# Patient Record
Sex: Female | Born: 1960
Health system: Southern US, Community
[De-identification: ages and names within clinical notes are randomized; demographics above are authoritative.]

## PROBLEM LIST (undated history)

## (undated) DIAGNOSIS — E119 Type 2 diabetes mellitus without complications: Secondary | ICD-10-CM

## (undated) DIAGNOSIS — R7989 Other specified abnormal findings of blood chemistry: Secondary | ICD-10-CM

## (undated) DIAGNOSIS — F329 Major depressive disorder, single episode, unspecified: Secondary | ICD-10-CM

## (undated) DIAGNOSIS — R410 Disorientation, unspecified: Secondary | ICD-10-CM

## (undated) DIAGNOSIS — N182 Chronic kidney disease, stage 2 (mild): Secondary | ICD-10-CM

## (undated) DIAGNOSIS — R9431 Abnormal electrocardiogram [ECG] [EKG]: Secondary | ICD-10-CM

## (undated) DIAGNOSIS — E875 Hyperkalemia: Secondary | ICD-10-CM

## (undated) DIAGNOSIS — F419 Anxiety disorder, unspecified: Secondary | ICD-10-CM

## (undated) DIAGNOSIS — I2510 Atherosclerotic heart disease of native coronary artery without angina pectoris: Secondary | ICD-10-CM

## (undated) DIAGNOSIS — E7800 Pure hypercholesterolemia, unspecified: Secondary | ICD-10-CM

## (undated) DIAGNOSIS — I4892 Unspecified atrial flutter: Secondary | ICD-10-CM

## (undated) DIAGNOSIS — D509 Iron deficiency anemia, unspecified: Secondary | ICD-10-CM

## (undated) DIAGNOSIS — W19XXXA Unspecified fall, initial encounter: Secondary | ICD-10-CM

## (undated) DIAGNOSIS — I130 Hypertensive heart and chronic kidney disease with heart failure and stage 1 through stage 4 chronic kidney disease, or unspecified chronic kidney disease: Secondary | ICD-10-CM

## (undated) DIAGNOSIS — R296 Repeated falls: Secondary | ICD-10-CM

## (undated) DIAGNOSIS — R609 Edema, unspecified: Secondary | ICD-10-CM

## (undated) DIAGNOSIS — F32A Depression, unspecified: Secondary | ICD-10-CM

## (undated) DIAGNOSIS — I10 Essential (primary) hypertension: Secondary | ICD-10-CM

## (undated) DIAGNOSIS — I428 Other cardiomyopathies: Secondary | ICD-10-CM

## (undated) DIAGNOSIS — I480 Paroxysmal atrial fibrillation: Secondary | ICD-10-CM

## (undated) DIAGNOSIS — I5042 Chronic combined systolic (congestive) and diastolic (congestive) heart failure: Secondary | ICD-10-CM

## (undated) DIAGNOSIS — E871 Hypo-osmolality and hyponatremia: Secondary | ICD-10-CM

## (undated) DIAGNOSIS — I5022 Chronic systolic (congestive) heart failure: Secondary | ICD-10-CM

## (undated) DIAGNOSIS — E0590 Thyrotoxicosis, unspecified without thyrotoxic crisis or storm: Secondary | ICD-10-CM

## (undated) DIAGNOSIS — K922 Gastrointestinal hemorrhage, unspecified: Secondary | ICD-10-CM

## (undated) DIAGNOSIS — E876 Hypokalemia: Secondary | ICD-10-CM

## (undated) DIAGNOSIS — K250 Acute gastric ulcer with hemorrhage: Secondary | ICD-10-CM

## (undated) DIAGNOSIS — I4819 Other persistent atrial fibrillation: Secondary | ICD-10-CM

## (undated) HISTORY — DX: Gastrointestinal hemorrhage, unspecified: K92.2

## (undated) HISTORY — DX: Unspecified atrial flutter: I48.92

## (undated) HISTORY — DX: Iron deficiency anemia, unspecified: D50.9

## (undated) HISTORY — DX: Depression, unspecified: F32.A

## (undated) HISTORY — DX: Pure hypercholesterolemia, unspecified: E78.00

## (undated) HISTORY — DX: Chronic combined systolic (congestive) and diastolic (congestive) heart failure: I50.42

## (undated) HISTORY — DX: Type 2 diabetes mellitus without complications: E11.9

## (undated) HISTORY — DX: Edema, unspecified: R60.9

## (undated) HISTORY — DX: Paroxysmal atrial fibrillation: I48.0

## (undated) HISTORY — PX: TUBAL LIGATION: SHX77

## (undated) HISTORY — DX: Acute gastric ulcer with hemorrhage: K25.0

## (undated) HISTORY — DX: Thyrotoxicosis, unspecified without thyrotoxic crisis or storm: E05.90

## (undated) HISTORY — DX: Hyperkalemia: E87.5

## (undated) HISTORY — DX: Hypertensive heart and chronic kidney disease with heart failure and stage 1 through stage 4 chronic kidney disease, or unspecified chronic kidney disease: I13.0

## (undated) HISTORY — DX: Anxiety disorder, unspecified: F41.9

## (undated) HISTORY — DX: Essential (primary) hypertension: I10

## (undated) HISTORY — DX: Other specified abnormal findings of blood chemistry: R79.89

## (undated) HISTORY — DX: Atherosclerotic heart disease of native coronary artery without angina pectoris: I25.10

---
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
## 2015-06-17 DIAGNOSIS — E1165 Type 2 diabetes mellitus with hyperglycemia: Secondary | ICD-10-CM | POA: Diagnosis not present

## 2015-06-17 DIAGNOSIS — E7800 Pure hypercholesterolemia, unspecified: Secondary | ICD-10-CM | POA: Diagnosis not present

## 2015-06-17 DIAGNOSIS — J0101 Acute recurrent maxillary sinusitis: Secondary | ICD-10-CM | POA: Diagnosis not present

## 2015-06-17 DIAGNOSIS — M159 Polyosteoarthritis, unspecified: Secondary | ICD-10-CM | POA: Diagnosis not present

## 2015-06-17 DIAGNOSIS — D649 Anemia, unspecified: Secondary | ICD-10-CM | POA: Diagnosis not present

## 2015-06-17 DIAGNOSIS — R7989 Other specified abnormal findings of blood chemistry: Secondary | ICD-10-CM | POA: Diagnosis not present

## 2015-06-17 DIAGNOSIS — R609 Edema, unspecified: Secondary | ICD-10-CM | POA: Diagnosis not present

## 2015-06-17 DIAGNOSIS — R69 Illness, unspecified: Secondary | ICD-10-CM | POA: Diagnosis not present

## 2015-06-17 DIAGNOSIS — E0590 Thyrotoxicosis, unspecified without thyrotoxic crisis or storm: Secondary | ICD-10-CM | POA: Diagnosis not present

## 2015-06-17 DIAGNOSIS — I10 Essential (primary) hypertension: Secondary | ICD-10-CM | POA: Diagnosis not present

## 2015-07-01 DIAGNOSIS — R809 Proteinuria, unspecified: Secondary | ICD-10-CM | POA: Diagnosis not present

## 2015-07-01 DIAGNOSIS — E7800 Pure hypercholesterolemia, unspecified: Secondary | ICD-10-CM | POA: Diagnosis not present

## 2015-07-01 DIAGNOSIS — M159 Polyosteoarthritis, unspecified: Secondary | ICD-10-CM | POA: Diagnosis not present

## 2015-07-01 DIAGNOSIS — D649 Anemia, unspecified: Secondary | ICD-10-CM | POA: Diagnosis not present

## 2015-07-01 DIAGNOSIS — R69 Illness, unspecified: Secondary | ICD-10-CM | POA: Diagnosis not present

## 2015-07-01 DIAGNOSIS — I10 Essential (primary) hypertension: Secondary | ICD-10-CM | POA: Diagnosis not present

## 2015-07-01 DIAGNOSIS — R609 Edema, unspecified: Secondary | ICD-10-CM | POA: Diagnosis not present

## 2015-07-01 DIAGNOSIS — R7989 Other specified abnormal findings of blood chemistry: Secondary | ICD-10-CM | POA: Diagnosis not present

## 2015-07-01 DIAGNOSIS — E1165 Type 2 diabetes mellitus with hyperglycemia: Secondary | ICD-10-CM | POA: Diagnosis not present

## 2015-07-01 DIAGNOSIS — J208 Acute bronchitis due to other specified organisms: Secondary | ICD-10-CM | POA: Diagnosis not present

## 2015-07-22 DIAGNOSIS — I10 Essential (primary) hypertension: Secondary | ICD-10-CM | POA: Diagnosis not present

## 2015-07-22 DIAGNOSIS — D649 Anemia, unspecified: Secondary | ICD-10-CM | POA: Diagnosis not present

## 2015-07-22 DIAGNOSIS — E1165 Type 2 diabetes mellitus with hyperglycemia: Secondary | ICD-10-CM | POA: Diagnosis not present

## 2015-07-22 DIAGNOSIS — E7800 Pure hypercholesterolemia, unspecified: Secondary | ICD-10-CM | POA: Diagnosis not present

## 2015-07-22 DIAGNOSIS — R69 Illness, unspecified: Secondary | ICD-10-CM | POA: Diagnosis not present

## 2015-07-22 DIAGNOSIS — M25552 Pain in left hip: Secondary | ICD-10-CM | POA: Diagnosis not present

## 2015-07-22 DIAGNOSIS — R945 Abnormal results of liver function studies: Secondary | ICD-10-CM | POA: Diagnosis not present

## 2015-07-22 DIAGNOSIS — R609 Edema, unspecified: Secondary | ICD-10-CM | POA: Diagnosis not present

## 2015-07-22 DIAGNOSIS — R809 Proteinuria, unspecified: Secondary | ICD-10-CM | POA: Diagnosis not present

## 2015-07-22 DIAGNOSIS — M159 Polyosteoarthritis, unspecified: Secondary | ICD-10-CM | POA: Diagnosis not present

## 2015-07-30 DIAGNOSIS — I10 Essential (primary) hypertension: Secondary | ICD-10-CM | POA: Diagnosis not present

## 2015-08-30 DIAGNOSIS — R609 Edema, unspecified: Secondary | ICD-10-CM | POA: Diagnosis not present

## 2015-08-30 DIAGNOSIS — E7800 Pure hypercholesterolemia, unspecified: Secondary | ICD-10-CM | POA: Diagnosis not present

## 2015-08-30 DIAGNOSIS — R69 Illness, unspecified: Secondary | ICD-10-CM | POA: Diagnosis not present

## 2015-08-30 DIAGNOSIS — R945 Abnormal results of liver function studies: Secondary | ICD-10-CM | POA: Diagnosis not present

## 2015-08-30 DIAGNOSIS — M159 Polyosteoarthritis, unspecified: Secondary | ICD-10-CM | POA: Diagnosis not present

## 2015-08-30 DIAGNOSIS — R809 Proteinuria, unspecified: Secondary | ICD-10-CM | POA: Diagnosis not present

## 2015-08-30 DIAGNOSIS — E0590 Thyrotoxicosis, unspecified without thyrotoxic crisis or storm: Secondary | ICD-10-CM | POA: Diagnosis not present

## 2015-08-30 DIAGNOSIS — E1165 Type 2 diabetes mellitus with hyperglycemia: Secondary | ICD-10-CM | POA: Diagnosis not present

## 2015-08-30 DIAGNOSIS — D649 Anemia, unspecified: Secondary | ICD-10-CM | POA: Diagnosis not present

## 2015-08-30 DIAGNOSIS — M25552 Pain in left hip: Secondary | ICD-10-CM | POA: Diagnosis not present

## 2015-09-21 DIAGNOSIS — J0100 Acute maxillary sinusitis, unspecified: Secondary | ICD-10-CM | POA: Diagnosis not present

## 2015-09-27 DIAGNOSIS — R809 Proteinuria, unspecified: Secondary | ICD-10-CM | POA: Diagnosis not present

## 2015-09-27 DIAGNOSIS — E1165 Type 2 diabetes mellitus with hyperglycemia: Secondary | ICD-10-CM | POA: Diagnosis not present

## 2015-09-27 DIAGNOSIS — J0101 Acute recurrent maxillary sinusitis: Secondary | ICD-10-CM | POA: Diagnosis not present

## 2015-09-27 DIAGNOSIS — M159 Polyosteoarthritis, unspecified: Secondary | ICD-10-CM | POA: Diagnosis not present

## 2015-09-27 DIAGNOSIS — R69 Illness, unspecified: Secondary | ICD-10-CM | POA: Diagnosis not present

## 2015-09-27 DIAGNOSIS — R609 Edema, unspecified: Secondary | ICD-10-CM | POA: Diagnosis not present

## 2015-09-27 DIAGNOSIS — E0590 Thyrotoxicosis, unspecified without thyrotoxic crisis or storm: Secondary | ICD-10-CM | POA: Diagnosis not present

## 2015-09-27 DIAGNOSIS — I10 Essential (primary) hypertension: Secondary | ICD-10-CM | POA: Diagnosis not present

## 2015-09-27 DIAGNOSIS — E7800 Pure hypercholesterolemia, unspecified: Secondary | ICD-10-CM | POA: Diagnosis not present

## 2015-09-27 DIAGNOSIS — D649 Anemia, unspecified: Secondary | ICD-10-CM | POA: Diagnosis not present

## 2015-09-27 DIAGNOSIS — R945 Abnormal results of liver function studies: Secondary | ICD-10-CM | POA: Diagnosis not present

## 2015-10-20 DIAGNOSIS — J0100 Acute maxillary sinusitis, unspecified: Secondary | ICD-10-CM | POA: Diagnosis not present

## 2015-10-20 DIAGNOSIS — J209 Acute bronchitis, unspecified: Secondary | ICD-10-CM | POA: Diagnosis not present

## 2015-10-26 DIAGNOSIS — E1165 Type 2 diabetes mellitus with hyperglycemia: Secondary | ICD-10-CM | POA: Diagnosis not present

## 2015-10-26 DIAGNOSIS — M159 Polyosteoarthritis, unspecified: Secondary | ICD-10-CM | POA: Diagnosis not present

## 2015-10-26 DIAGNOSIS — E7800 Pure hypercholesterolemia, unspecified: Secondary | ICD-10-CM | POA: Diagnosis not present

## 2015-10-26 DIAGNOSIS — R945 Abnormal results of liver function studies: Secondary | ICD-10-CM | POA: Diagnosis not present

## 2015-10-26 DIAGNOSIS — R609 Edema, unspecified: Secondary | ICD-10-CM | POA: Diagnosis not present

## 2015-10-26 DIAGNOSIS — R69 Illness, unspecified: Secondary | ICD-10-CM | POA: Diagnosis not present

## 2015-10-26 DIAGNOSIS — I10 Essential (primary) hypertension: Secondary | ICD-10-CM | POA: Diagnosis not present

## 2015-10-26 DIAGNOSIS — E0590 Thyrotoxicosis, unspecified without thyrotoxic crisis or storm: Secondary | ICD-10-CM | POA: Diagnosis not present

## 2015-10-26 DIAGNOSIS — D649 Anemia, unspecified: Secondary | ICD-10-CM | POA: Diagnosis not present

## 2015-10-26 DIAGNOSIS — R809 Proteinuria, unspecified: Secondary | ICD-10-CM | POA: Diagnosis not present

## 2015-11-24 DIAGNOSIS — I10 Essential (primary) hypertension: Secondary | ICD-10-CM | POA: Diagnosis not present

## 2015-11-24 DIAGNOSIS — E1165 Type 2 diabetes mellitus with hyperglycemia: Secondary | ICD-10-CM | POA: Diagnosis not present

## 2015-11-24 DIAGNOSIS — E7800 Pure hypercholesterolemia, unspecified: Secondary | ICD-10-CM | POA: Diagnosis not present

## 2015-11-24 DIAGNOSIS — R945 Abnormal results of liver function studies: Secondary | ICD-10-CM | POA: Diagnosis not present

## 2015-11-24 DIAGNOSIS — R809 Proteinuria, unspecified: Secondary | ICD-10-CM | POA: Diagnosis not present

## 2015-11-24 DIAGNOSIS — E0590 Thyrotoxicosis, unspecified without thyrotoxic crisis or storm: Secondary | ICD-10-CM | POA: Diagnosis not present

## 2015-11-24 DIAGNOSIS — R69 Illness, unspecified: Secondary | ICD-10-CM | POA: Diagnosis not present

## 2015-11-24 DIAGNOSIS — R609 Edema, unspecified: Secondary | ICD-10-CM | POA: Diagnosis not present

## 2015-11-24 DIAGNOSIS — M159 Polyosteoarthritis, unspecified: Secondary | ICD-10-CM | POA: Diagnosis not present

## 2015-11-24 DIAGNOSIS — D649 Anemia, unspecified: Secondary | ICD-10-CM | POA: Diagnosis not present

## 2015-11-24 DIAGNOSIS — Z6831 Body mass index (BMI) 31.0-31.9, adult: Secondary | ICD-10-CM | POA: Diagnosis not present

## 2015-12-22 DIAGNOSIS — R69 Illness, unspecified: Secondary | ICD-10-CM | POA: Diagnosis not present

## 2015-12-22 DIAGNOSIS — E7800 Pure hypercholesterolemia, unspecified: Secondary | ICD-10-CM | POA: Diagnosis not present

## 2015-12-22 DIAGNOSIS — M159 Polyosteoarthritis, unspecified: Secondary | ICD-10-CM | POA: Diagnosis not present

## 2015-12-22 DIAGNOSIS — E0590 Thyrotoxicosis, unspecified without thyrotoxic crisis or storm: Secondary | ICD-10-CM | POA: Diagnosis not present

## 2015-12-22 DIAGNOSIS — R809 Proteinuria, unspecified: Secondary | ICD-10-CM | POA: Diagnosis not present

## 2015-12-22 DIAGNOSIS — E1165 Type 2 diabetes mellitus with hyperglycemia: Secondary | ICD-10-CM | POA: Diagnosis not present

## 2015-12-22 DIAGNOSIS — L0390 Cellulitis, unspecified: Secondary | ICD-10-CM | POA: Diagnosis not present

## 2015-12-22 DIAGNOSIS — R609 Edema, unspecified: Secondary | ICD-10-CM | POA: Diagnosis not present

## 2015-12-22 DIAGNOSIS — D649 Anemia, unspecified: Secondary | ICD-10-CM | POA: Diagnosis not present

## 2015-12-22 DIAGNOSIS — R945 Abnormal results of liver function studies: Secondary | ICD-10-CM | POA: Diagnosis not present

## 2016-01-19 DIAGNOSIS — E1165 Type 2 diabetes mellitus with hyperglycemia: Secondary | ICD-10-CM | POA: Diagnosis not present

## 2016-01-19 DIAGNOSIS — E0590 Thyrotoxicosis, unspecified without thyrotoxic crisis or storm: Secondary | ICD-10-CM | POA: Diagnosis not present

## 2016-01-19 DIAGNOSIS — D649 Anemia, unspecified: Secondary | ICD-10-CM | POA: Diagnosis not present

## 2016-01-19 DIAGNOSIS — M159 Polyosteoarthritis, unspecified: Secondary | ICD-10-CM | POA: Diagnosis not present

## 2016-01-19 DIAGNOSIS — E7800 Pure hypercholesterolemia, unspecified: Secondary | ICD-10-CM | POA: Diagnosis not present

## 2016-01-19 DIAGNOSIS — Z79899 Other long term (current) drug therapy: Secondary | ICD-10-CM | POA: Diagnosis not present

## 2016-01-19 DIAGNOSIS — R809 Proteinuria, unspecified: Secondary | ICD-10-CM | POA: Diagnosis not present

## 2016-01-19 DIAGNOSIS — R945 Abnormal results of liver function studies: Secondary | ICD-10-CM | POA: Diagnosis not present

## 2016-01-19 DIAGNOSIS — R609 Edema, unspecified: Secondary | ICD-10-CM | POA: Diagnosis not present

## 2016-01-19 DIAGNOSIS — R69 Illness, unspecified: Secondary | ICD-10-CM | POA: Diagnosis not present

## 2016-02-20 DIAGNOSIS — R69 Illness, unspecified: Secondary | ICD-10-CM | POA: Diagnosis not present

## 2016-02-20 DIAGNOSIS — E0590 Thyrotoxicosis, unspecified without thyrotoxic crisis or storm: Secondary | ICD-10-CM | POA: Diagnosis not present

## 2016-02-20 DIAGNOSIS — E7800 Pure hypercholesterolemia, unspecified: Secondary | ICD-10-CM | POA: Diagnosis not present

## 2016-02-20 DIAGNOSIS — I10 Essential (primary) hypertension: Secondary | ICD-10-CM | POA: Diagnosis not present

## 2016-02-20 DIAGNOSIS — E1165 Type 2 diabetes mellitus with hyperglycemia: Secondary | ICD-10-CM | POA: Diagnosis not present

## 2016-02-20 DIAGNOSIS — D649 Anemia, unspecified: Secondary | ICD-10-CM | POA: Diagnosis not present

## 2016-02-20 DIAGNOSIS — R609 Edema, unspecified: Secondary | ICD-10-CM | POA: Diagnosis not present

## 2016-02-20 DIAGNOSIS — M159 Polyosteoarthritis, unspecified: Secondary | ICD-10-CM | POA: Diagnosis not present

## 2016-02-20 DIAGNOSIS — R7989 Other specified abnormal findings of blood chemistry: Secondary | ICD-10-CM | POA: Diagnosis not present

## 2016-02-20 DIAGNOSIS — M25552 Pain in left hip: Secondary | ICD-10-CM | POA: Diagnosis not present

## 2016-03-06 DIAGNOSIS — D649 Anemia, unspecified: Secondary | ICD-10-CM | POA: Diagnosis not present

## 2016-03-06 DIAGNOSIS — R609 Edema, unspecified: Secondary | ICD-10-CM | POA: Diagnosis not present

## 2016-03-06 DIAGNOSIS — R7989 Other specified abnormal findings of blood chemistry: Secondary | ICD-10-CM | POA: Diagnosis not present

## 2016-03-06 DIAGNOSIS — E1165 Type 2 diabetes mellitus with hyperglycemia: Secondary | ICD-10-CM | POA: Diagnosis not present

## 2016-03-06 DIAGNOSIS — M159 Polyosteoarthritis, unspecified: Secondary | ICD-10-CM | POA: Diagnosis not present

## 2016-03-06 DIAGNOSIS — M25512 Pain in left shoulder: Secondary | ICD-10-CM | POA: Diagnosis not present

## 2016-03-06 DIAGNOSIS — E7800 Pure hypercholesterolemia, unspecified: Secondary | ICD-10-CM | POA: Diagnosis not present

## 2016-03-06 DIAGNOSIS — I10 Essential (primary) hypertension: Secondary | ICD-10-CM | POA: Diagnosis not present

## 2016-03-06 DIAGNOSIS — E0590 Thyrotoxicosis, unspecified without thyrotoxic crisis or storm: Secondary | ICD-10-CM | POA: Diagnosis not present

## 2016-03-06 DIAGNOSIS — R69 Illness, unspecified: Secondary | ICD-10-CM | POA: Diagnosis not present

## 2016-03-26 DIAGNOSIS — E0590 Thyrotoxicosis, unspecified without thyrotoxic crisis or storm: Secondary | ICD-10-CM | POA: Diagnosis not present

## 2016-03-26 DIAGNOSIS — M159 Polyosteoarthritis, unspecified: Secondary | ICD-10-CM | POA: Diagnosis not present

## 2016-03-26 DIAGNOSIS — M25512 Pain in left shoulder: Secondary | ICD-10-CM | POA: Diagnosis not present

## 2016-03-26 DIAGNOSIS — D649 Anemia, unspecified: Secondary | ICD-10-CM | POA: Diagnosis not present

## 2016-03-26 DIAGNOSIS — R609 Edema, unspecified: Secondary | ICD-10-CM | POA: Diagnosis not present

## 2016-03-26 DIAGNOSIS — E7800 Pure hypercholesterolemia, unspecified: Secondary | ICD-10-CM | POA: Diagnosis not present

## 2016-03-26 DIAGNOSIS — E1165 Type 2 diabetes mellitus with hyperglycemia: Secondary | ICD-10-CM | POA: Diagnosis not present

## 2016-03-26 DIAGNOSIS — R7989 Other specified abnormal findings of blood chemistry: Secondary | ICD-10-CM | POA: Diagnosis not present

## 2016-03-26 DIAGNOSIS — I10 Essential (primary) hypertension: Secondary | ICD-10-CM | POA: Diagnosis not present

## 2016-03-26 DIAGNOSIS — R69 Illness, unspecified: Secondary | ICD-10-CM | POA: Diagnosis not present

## 2016-04-24 DIAGNOSIS — E1165 Type 2 diabetes mellitus with hyperglycemia: Secondary | ICD-10-CM | POA: Diagnosis not present

## 2016-04-24 DIAGNOSIS — D649 Anemia, unspecified: Secondary | ICD-10-CM | POA: Diagnosis not present

## 2016-04-24 DIAGNOSIS — R809 Proteinuria, unspecified: Secondary | ICD-10-CM | POA: Diagnosis not present

## 2016-04-24 DIAGNOSIS — M159 Polyosteoarthritis, unspecified: Secondary | ICD-10-CM | POA: Diagnosis not present

## 2016-04-24 DIAGNOSIS — E7800 Pure hypercholesterolemia, unspecified: Secondary | ICD-10-CM | POA: Diagnosis not present

## 2016-04-24 DIAGNOSIS — R7989 Other specified abnormal findings of blood chemistry: Secondary | ICD-10-CM | POA: Diagnosis not present

## 2016-04-24 DIAGNOSIS — I10 Essential (primary) hypertension: Secondary | ICD-10-CM | POA: Diagnosis not present

## 2016-04-24 DIAGNOSIS — R69 Illness, unspecified: Secondary | ICD-10-CM | POA: Diagnosis not present

## 2016-04-24 DIAGNOSIS — R609 Edema, unspecified: Secondary | ICD-10-CM | POA: Diagnosis not present

## 2016-04-24 DIAGNOSIS — E0590 Thyrotoxicosis, unspecified without thyrotoxic crisis or storm: Secondary | ICD-10-CM | POA: Diagnosis not present

## 2016-05-22 DIAGNOSIS — Z23 Encounter for immunization: Secondary | ICD-10-CM | POA: Diagnosis not present

## 2016-05-22 DIAGNOSIS — R69 Illness, unspecified: Secondary | ICD-10-CM | POA: Diagnosis not present

## 2016-05-22 DIAGNOSIS — D649 Anemia, unspecified: Secondary | ICD-10-CM | POA: Diagnosis not present

## 2016-05-22 DIAGNOSIS — R609 Edema, unspecified: Secondary | ICD-10-CM | POA: Diagnosis not present

## 2016-05-22 DIAGNOSIS — I10 Essential (primary) hypertension: Secondary | ICD-10-CM | POA: Diagnosis not present

## 2016-05-22 DIAGNOSIS — R7989 Other specified abnormal findings of blood chemistry: Secondary | ICD-10-CM | POA: Diagnosis not present

## 2016-05-22 DIAGNOSIS — E7800 Pure hypercholesterolemia, unspecified: Secondary | ICD-10-CM | POA: Diagnosis not present

## 2016-05-22 DIAGNOSIS — M25552 Pain in left hip: Secondary | ICD-10-CM | POA: Diagnosis not present

## 2016-05-22 DIAGNOSIS — E1165 Type 2 diabetes mellitus with hyperglycemia: Secondary | ICD-10-CM | POA: Diagnosis not present

## 2016-05-22 DIAGNOSIS — M159 Polyosteoarthritis, unspecified: Secondary | ICD-10-CM | POA: Diagnosis not present

## 2016-06-14 DIAGNOSIS — R69 Illness, unspecified: Secondary | ICD-10-CM | POA: Diagnosis not present

## 2016-06-14 DIAGNOSIS — M159 Polyosteoarthritis, unspecified: Secondary | ICD-10-CM | POA: Diagnosis not present

## 2016-06-14 DIAGNOSIS — E0590 Thyrotoxicosis, unspecified without thyrotoxic crisis or storm: Secondary | ICD-10-CM | POA: Diagnosis not present

## 2016-06-14 DIAGNOSIS — E1165 Type 2 diabetes mellitus with hyperglycemia: Secondary | ICD-10-CM | POA: Diagnosis not present

## 2016-06-14 DIAGNOSIS — D649 Anemia, unspecified: Secondary | ICD-10-CM | POA: Diagnosis not present

## 2016-06-14 DIAGNOSIS — R7989 Other specified abnormal findings of blood chemistry: Secondary | ICD-10-CM | POA: Diagnosis not present

## 2016-06-14 DIAGNOSIS — R809 Proteinuria, unspecified: Secondary | ICD-10-CM | POA: Diagnosis not present

## 2016-06-14 DIAGNOSIS — M25552 Pain in left hip: Secondary | ICD-10-CM | POA: Diagnosis not present

## 2016-06-14 DIAGNOSIS — I10 Essential (primary) hypertension: Secondary | ICD-10-CM | POA: Diagnosis not present

## 2016-06-14 DIAGNOSIS — R609 Edema, unspecified: Secondary | ICD-10-CM | POA: Diagnosis not present

## 2016-06-20 DIAGNOSIS — R69 Illness, unspecified: Secondary | ICD-10-CM | POA: Diagnosis not present

## 2016-06-20 DIAGNOSIS — E7800 Pure hypercholesterolemia, unspecified: Secondary | ICD-10-CM | POA: Diagnosis not present

## 2016-06-20 DIAGNOSIS — I10 Essential (primary) hypertension: Secondary | ICD-10-CM | POA: Diagnosis not present

## 2016-06-20 DIAGNOSIS — E1165 Type 2 diabetes mellitus with hyperglycemia: Secondary | ICD-10-CM | POA: Diagnosis not present

## 2016-06-20 DIAGNOSIS — R7989 Other specified abnormal findings of blood chemistry: Secondary | ICD-10-CM | POA: Diagnosis not present

## 2016-06-20 DIAGNOSIS — D649 Anemia, unspecified: Secondary | ICD-10-CM | POA: Diagnosis not present

## 2016-06-20 DIAGNOSIS — R609 Edema, unspecified: Secondary | ICD-10-CM | POA: Diagnosis not present

## 2016-06-20 DIAGNOSIS — R809 Proteinuria, unspecified: Secondary | ICD-10-CM | POA: Diagnosis not present

## 2016-06-20 DIAGNOSIS — E0590 Thyrotoxicosis, unspecified without thyrotoxic crisis or storm: Secondary | ICD-10-CM | POA: Diagnosis not present

## 2016-06-20 DIAGNOSIS — M159 Polyosteoarthritis, unspecified: Secondary | ICD-10-CM | POA: Diagnosis not present

## 2016-06-20 DIAGNOSIS — M25552 Pain in left hip: Secondary | ICD-10-CM | POA: Diagnosis not present

## 2016-07-03 DIAGNOSIS — R7989 Other specified abnormal findings of blood chemistry: Secondary | ICD-10-CM | POA: Diagnosis not present

## 2016-07-03 DIAGNOSIS — E1165 Type 2 diabetes mellitus with hyperglycemia: Secondary | ICD-10-CM | POA: Diagnosis not present

## 2016-07-03 DIAGNOSIS — D649 Anemia, unspecified: Secondary | ICD-10-CM | POA: Diagnosis not present

## 2016-07-03 DIAGNOSIS — R809 Proteinuria, unspecified: Secondary | ICD-10-CM | POA: Diagnosis not present

## 2016-07-03 DIAGNOSIS — M25552 Pain in left hip: Secondary | ICD-10-CM | POA: Diagnosis not present

## 2016-07-03 DIAGNOSIS — R609 Edema, unspecified: Secondary | ICD-10-CM | POA: Diagnosis not present

## 2016-07-03 DIAGNOSIS — E0590 Thyrotoxicosis, unspecified without thyrotoxic crisis or storm: Secondary | ICD-10-CM | POA: Diagnosis not present

## 2016-07-03 DIAGNOSIS — M159 Polyosteoarthritis, unspecified: Secondary | ICD-10-CM | POA: Diagnosis not present

## 2016-07-03 DIAGNOSIS — R69 Illness, unspecified: Secondary | ICD-10-CM | POA: Diagnosis not present

## 2016-07-03 DIAGNOSIS — I10 Essential (primary) hypertension: Secondary | ICD-10-CM | POA: Diagnosis not present

## 2016-07-18 DIAGNOSIS — M159 Polyosteoarthritis, unspecified: Secondary | ICD-10-CM | POA: Diagnosis not present

## 2016-08-21 DIAGNOSIS — E7800 Pure hypercholesterolemia, unspecified: Secondary | ICD-10-CM | POA: Diagnosis not present

## 2016-08-21 DIAGNOSIS — E0590 Thyrotoxicosis, unspecified without thyrotoxic crisis or storm: Secondary | ICD-10-CM | POA: Diagnosis not present

## 2016-08-21 DIAGNOSIS — R609 Edema, unspecified: Secondary | ICD-10-CM | POA: Diagnosis not present

## 2016-08-21 DIAGNOSIS — R809 Proteinuria, unspecified: Secondary | ICD-10-CM | POA: Diagnosis not present

## 2016-08-21 DIAGNOSIS — M159 Polyosteoarthritis, unspecified: Secondary | ICD-10-CM | POA: Diagnosis not present

## 2016-08-21 DIAGNOSIS — D649 Anemia, unspecified: Secondary | ICD-10-CM | POA: Diagnosis not present

## 2016-08-21 DIAGNOSIS — I10 Essential (primary) hypertension: Secondary | ICD-10-CM | POA: Diagnosis not present

## 2016-08-21 DIAGNOSIS — R69 Illness, unspecified: Secondary | ICD-10-CM | POA: Diagnosis not present

## 2016-08-21 DIAGNOSIS — E1165 Type 2 diabetes mellitus with hyperglycemia: Secondary | ICD-10-CM | POA: Diagnosis not present

## 2016-08-21 DIAGNOSIS — R7989 Other specified abnormal findings of blood chemistry: Secondary | ICD-10-CM | POA: Diagnosis not present

## 2016-09-18 DIAGNOSIS — R609 Edema, unspecified: Secondary | ICD-10-CM | POA: Diagnosis not present

## 2016-09-18 DIAGNOSIS — M25552 Pain in left hip: Secondary | ICD-10-CM | POA: Diagnosis not present

## 2016-09-18 DIAGNOSIS — R69 Illness, unspecified: Secondary | ICD-10-CM | POA: Diagnosis not present

## 2016-09-18 DIAGNOSIS — E1165 Type 2 diabetes mellitus with hyperglycemia: Secondary | ICD-10-CM | POA: Diagnosis not present

## 2016-09-18 DIAGNOSIS — D649 Anemia, unspecified: Secondary | ICD-10-CM | POA: Diagnosis not present

## 2016-09-18 DIAGNOSIS — R7989 Other specified abnormal findings of blood chemistry: Secondary | ICD-10-CM | POA: Diagnosis not present

## 2016-09-18 DIAGNOSIS — R5382 Chronic fatigue, unspecified: Secondary | ICD-10-CM | POA: Diagnosis not present

## 2016-09-18 DIAGNOSIS — E0590 Thyrotoxicosis, unspecified without thyrotoxic crisis or storm: Secondary | ICD-10-CM | POA: Diagnosis not present

## 2016-09-18 DIAGNOSIS — M159 Polyosteoarthritis, unspecified: Secondary | ICD-10-CM | POA: Diagnosis not present

## 2016-09-18 DIAGNOSIS — I10 Essential (primary) hypertension: Secondary | ICD-10-CM | POA: Diagnosis not present

## 2016-09-21 DIAGNOSIS — R69 Illness, unspecified: Secondary | ICD-10-CM | POA: Diagnosis not present

## 2016-09-21 DIAGNOSIS — I10 Essential (primary) hypertension: Secondary | ICD-10-CM | POA: Diagnosis not present

## 2016-09-21 DIAGNOSIS — R7989 Other specified abnormal findings of blood chemistry: Secondary | ICD-10-CM | POA: Diagnosis not present

## 2016-09-21 DIAGNOSIS — M159 Polyosteoarthritis, unspecified: Secondary | ICD-10-CM | POA: Diagnosis not present

## 2016-09-21 DIAGNOSIS — E1165 Type 2 diabetes mellitus with hyperglycemia: Secondary | ICD-10-CM | POA: Diagnosis not present

## 2016-09-21 DIAGNOSIS — R609 Edema, unspecified: Secondary | ICD-10-CM | POA: Diagnosis not present

## 2016-09-21 DIAGNOSIS — J208 Acute bronchitis due to other specified organisms: Secondary | ICD-10-CM | POA: Diagnosis not present

## 2016-09-21 DIAGNOSIS — D649 Anemia, unspecified: Secondary | ICD-10-CM | POA: Diagnosis not present

## 2016-10-16 DIAGNOSIS — R7989 Other specified abnormal findings of blood chemistry: Secondary | ICD-10-CM | POA: Diagnosis not present

## 2016-10-16 DIAGNOSIS — E0590 Thyrotoxicosis, unspecified without thyrotoxic crisis or storm: Secondary | ICD-10-CM | POA: Diagnosis not present

## 2016-10-16 DIAGNOSIS — E1165 Type 2 diabetes mellitus with hyperglycemia: Secondary | ICD-10-CM | POA: Diagnosis not present

## 2016-10-16 DIAGNOSIS — R69 Illness, unspecified: Secondary | ICD-10-CM | POA: Diagnosis not present

## 2016-10-16 DIAGNOSIS — I10 Essential (primary) hypertension: Secondary | ICD-10-CM | POA: Diagnosis not present

## 2016-10-16 DIAGNOSIS — Z79899 Other long term (current) drug therapy: Secondary | ICD-10-CM | POA: Diagnosis not present

## 2016-10-16 DIAGNOSIS — E7800 Pure hypercholesterolemia, unspecified: Secondary | ICD-10-CM | POA: Diagnosis not present

## 2016-10-16 DIAGNOSIS — D649 Anemia, unspecified: Secondary | ICD-10-CM | POA: Diagnosis not present

## 2016-10-16 DIAGNOSIS — R609 Edema, unspecified: Secondary | ICD-10-CM | POA: Diagnosis not present

## 2016-10-16 DIAGNOSIS — M159 Polyosteoarthritis, unspecified: Secondary | ICD-10-CM | POA: Diagnosis not present

## 2016-10-31 DIAGNOSIS — I10 Essential (primary) hypertension: Secondary | ICD-10-CM | POA: Diagnosis not present

## 2016-11-13 DIAGNOSIS — E7800 Pure hypercholesterolemia, unspecified: Secondary | ICD-10-CM | POA: Diagnosis not present

## 2016-11-13 DIAGNOSIS — M25552 Pain in left hip: Secondary | ICD-10-CM | POA: Diagnosis not present

## 2016-11-13 DIAGNOSIS — D649 Anemia, unspecified: Secondary | ICD-10-CM | POA: Diagnosis not present

## 2016-11-13 DIAGNOSIS — I10 Essential (primary) hypertension: Secondary | ICD-10-CM | POA: Diagnosis not present

## 2016-11-13 DIAGNOSIS — E0590 Thyrotoxicosis, unspecified without thyrotoxic crisis or storm: Secondary | ICD-10-CM | POA: Diagnosis not present

## 2016-11-13 DIAGNOSIS — R7989 Other specified abnormal findings of blood chemistry: Secondary | ICD-10-CM | POA: Diagnosis not present

## 2016-11-13 DIAGNOSIS — M159 Polyosteoarthritis, unspecified: Secondary | ICD-10-CM | POA: Diagnosis not present

## 2016-11-13 DIAGNOSIS — R609 Edema, unspecified: Secondary | ICD-10-CM | POA: Diagnosis not present

## 2016-11-13 DIAGNOSIS — E1165 Type 2 diabetes mellitus with hyperglycemia: Secondary | ICD-10-CM | POA: Diagnosis not present

## 2016-11-13 DIAGNOSIS — R69 Illness, unspecified: Secondary | ICD-10-CM | POA: Diagnosis not present

## 2016-12-11 DIAGNOSIS — I10 Essential (primary) hypertension: Secondary | ICD-10-CM | POA: Diagnosis not present

## 2016-12-11 DIAGNOSIS — R7989 Other specified abnormal findings of blood chemistry: Secondary | ICD-10-CM | POA: Diagnosis not present

## 2016-12-11 DIAGNOSIS — E7800 Pure hypercholesterolemia, unspecified: Secondary | ICD-10-CM | POA: Diagnosis not present

## 2016-12-11 DIAGNOSIS — R609 Edema, unspecified: Secondary | ICD-10-CM | POA: Diagnosis not present

## 2016-12-11 DIAGNOSIS — D649 Anemia, unspecified: Secondary | ICD-10-CM | POA: Diagnosis not present

## 2016-12-11 DIAGNOSIS — M159 Polyosteoarthritis, unspecified: Secondary | ICD-10-CM | POA: Diagnosis not present

## 2016-12-11 DIAGNOSIS — E0590 Thyrotoxicosis, unspecified without thyrotoxic crisis or storm: Secondary | ICD-10-CM | POA: Diagnosis not present

## 2016-12-11 DIAGNOSIS — E1165 Type 2 diabetes mellitus with hyperglycemia: Secondary | ICD-10-CM | POA: Diagnosis not present

## 2016-12-11 DIAGNOSIS — R809 Proteinuria, unspecified: Secondary | ICD-10-CM | POA: Diagnosis not present

## 2016-12-11 DIAGNOSIS — R69 Illness, unspecified: Secondary | ICD-10-CM | POA: Diagnosis not present

## 2017-01-09 DIAGNOSIS — I10 Essential (primary) hypertension: Secondary | ICD-10-CM | POA: Diagnosis not present

## 2017-01-09 DIAGNOSIS — D649 Anemia, unspecified: Secondary | ICD-10-CM | POA: Diagnosis not present

## 2017-01-09 DIAGNOSIS — R69 Illness, unspecified: Secondary | ICD-10-CM | POA: Diagnosis not present

## 2017-01-09 DIAGNOSIS — E7800 Pure hypercholesterolemia, unspecified: Secondary | ICD-10-CM | POA: Diagnosis not present

## 2017-01-09 DIAGNOSIS — J309 Allergic rhinitis, unspecified: Secondary | ICD-10-CM | POA: Diagnosis not present

## 2017-01-09 DIAGNOSIS — E0590 Thyrotoxicosis, unspecified without thyrotoxic crisis or storm: Secondary | ICD-10-CM | POA: Diagnosis not present

## 2017-01-09 DIAGNOSIS — M159 Polyosteoarthritis, unspecified: Secondary | ICD-10-CM | POA: Diagnosis not present

## 2017-01-09 DIAGNOSIS — E1165 Type 2 diabetes mellitus with hyperglycemia: Secondary | ICD-10-CM | POA: Diagnosis not present

## 2017-01-09 DIAGNOSIS — R7989 Other specified abnormal findings of blood chemistry: Secondary | ICD-10-CM | POA: Diagnosis not present

## 2017-01-09 DIAGNOSIS — R609 Edema, unspecified: Secondary | ICD-10-CM | POA: Diagnosis not present

## 2017-02-06 DIAGNOSIS — Z1389 Encounter for screening for other disorder: Secondary | ICD-10-CM | POA: Diagnosis not present

## 2017-02-06 DIAGNOSIS — R609 Edema, unspecified: Secondary | ICD-10-CM | POA: Diagnosis not present

## 2017-02-06 DIAGNOSIS — I10 Essential (primary) hypertension: Secondary | ICD-10-CM | POA: Diagnosis not present

## 2017-02-06 DIAGNOSIS — M159 Polyosteoarthritis, unspecified: Secondary | ICD-10-CM | POA: Diagnosis not present

## 2017-02-06 DIAGNOSIS — R7989 Other specified abnormal findings of blood chemistry: Secondary | ICD-10-CM | POA: Diagnosis not present

## 2017-02-06 DIAGNOSIS — E7800 Pure hypercholesterolemia, unspecified: Secondary | ICD-10-CM | POA: Diagnosis not present

## 2017-02-06 DIAGNOSIS — D649 Anemia, unspecified: Secondary | ICD-10-CM | POA: Diagnosis not present

## 2017-02-06 DIAGNOSIS — E0590 Thyrotoxicosis, unspecified without thyrotoxic crisis or storm: Secondary | ICD-10-CM | POA: Diagnosis not present

## 2017-02-06 DIAGNOSIS — R69 Illness, unspecified: Secondary | ICD-10-CM | POA: Diagnosis not present

## 2017-02-06 DIAGNOSIS — E1165 Type 2 diabetes mellitus with hyperglycemia: Secondary | ICD-10-CM | POA: Diagnosis not present

## 2017-03-04 DIAGNOSIS — E0590 Thyrotoxicosis, unspecified without thyrotoxic crisis or storm: Secondary | ICD-10-CM | POA: Diagnosis not present

## 2017-03-04 DIAGNOSIS — J309 Allergic rhinitis, unspecified: Secondary | ICD-10-CM | POA: Diagnosis not present

## 2017-03-04 DIAGNOSIS — E7800 Pure hypercholesterolemia, unspecified: Secondary | ICD-10-CM | POA: Diagnosis not present

## 2017-03-04 DIAGNOSIS — I10 Essential (primary) hypertension: Secondary | ICD-10-CM | POA: Diagnosis not present

## 2017-03-04 DIAGNOSIS — E1165 Type 2 diabetes mellitus with hyperglycemia: Secondary | ICD-10-CM | POA: Diagnosis not present

## 2017-03-04 DIAGNOSIS — D649 Anemia, unspecified: Secondary | ICD-10-CM | POA: Diagnosis not present

## 2017-03-04 DIAGNOSIS — M159 Polyosteoarthritis, unspecified: Secondary | ICD-10-CM | POA: Diagnosis not present

## 2017-03-04 DIAGNOSIS — R69 Illness, unspecified: Secondary | ICD-10-CM | POA: Diagnosis not present

## 2017-03-04 DIAGNOSIS — R945 Abnormal results of liver function studies: Secondary | ICD-10-CM | POA: Diagnosis not present

## 2017-03-04 DIAGNOSIS — R609 Edema, unspecified: Secondary | ICD-10-CM | POA: Diagnosis not present

## 2017-03-22 DIAGNOSIS — I10 Essential (primary) hypertension: Secondary | ICD-10-CM | POA: Diagnosis not present

## 2017-03-22 DIAGNOSIS — R69 Illness, unspecified: Secondary | ICD-10-CM | POA: Diagnosis not present

## 2017-03-22 DIAGNOSIS — M159 Polyosteoarthritis, unspecified: Secondary | ICD-10-CM | POA: Diagnosis not present

## 2017-03-22 DIAGNOSIS — R945 Abnormal results of liver function studies: Secondary | ICD-10-CM | POA: Diagnosis not present

## 2017-03-22 DIAGNOSIS — E0590 Thyrotoxicosis, unspecified without thyrotoxic crisis or storm: Secondary | ICD-10-CM | POA: Diagnosis not present

## 2017-03-22 DIAGNOSIS — E1165 Type 2 diabetes mellitus with hyperglycemia: Secondary | ICD-10-CM | POA: Diagnosis not present

## 2017-03-22 DIAGNOSIS — D649 Anemia, unspecified: Secondary | ICD-10-CM | POA: Diagnosis not present

## 2017-03-22 DIAGNOSIS — M25552 Pain in left hip: Secondary | ICD-10-CM | POA: Diagnosis not present

## 2017-03-22 DIAGNOSIS — R609 Edema, unspecified: Secondary | ICD-10-CM | POA: Diagnosis not present

## 2017-03-22 DIAGNOSIS — E7800 Pure hypercholesterolemia, unspecified: Secondary | ICD-10-CM | POA: Diagnosis not present

## 2017-04-04 DIAGNOSIS — R609 Edema, unspecified: Secondary | ICD-10-CM | POA: Diagnosis not present

## 2017-04-04 DIAGNOSIS — R945 Abnormal results of liver function studies: Secondary | ICD-10-CM | POA: Diagnosis not present

## 2017-04-04 DIAGNOSIS — D649 Anemia, unspecified: Secondary | ICD-10-CM | POA: Diagnosis not present

## 2017-04-04 DIAGNOSIS — E7800 Pure hypercholesterolemia, unspecified: Secondary | ICD-10-CM | POA: Diagnosis not present

## 2017-04-04 DIAGNOSIS — R69 Illness, unspecified: Secondary | ICD-10-CM | POA: Diagnosis not present

## 2017-04-04 DIAGNOSIS — M159 Polyosteoarthritis, unspecified: Secondary | ICD-10-CM | POA: Diagnosis not present

## 2017-04-04 DIAGNOSIS — I10 Essential (primary) hypertension: Secondary | ICD-10-CM | POA: Diagnosis not present

## 2017-04-04 DIAGNOSIS — E0590 Thyrotoxicosis, unspecified without thyrotoxic crisis or storm: Secondary | ICD-10-CM | POA: Diagnosis not present

## 2017-04-04 DIAGNOSIS — E1165 Type 2 diabetes mellitus with hyperglycemia: Secondary | ICD-10-CM | POA: Diagnosis not present

## 2017-04-04 DIAGNOSIS — M722 Plantar fascial fibromatosis: Secondary | ICD-10-CM | POA: Diagnosis not present

## 2017-05-02 DIAGNOSIS — R945 Abnormal results of liver function studies: Secondary | ICD-10-CM | POA: Diagnosis not present

## 2017-05-02 DIAGNOSIS — E1165 Type 2 diabetes mellitus with hyperglycemia: Secondary | ICD-10-CM | POA: Diagnosis not present

## 2017-05-02 DIAGNOSIS — E7800 Pure hypercholesterolemia, unspecified: Secondary | ICD-10-CM | POA: Diagnosis not present

## 2017-05-02 DIAGNOSIS — R609 Edema, unspecified: Secondary | ICD-10-CM | POA: Diagnosis not present

## 2017-05-02 DIAGNOSIS — R69 Illness, unspecified: Secondary | ICD-10-CM | POA: Diagnosis not present

## 2017-05-02 DIAGNOSIS — M159 Polyosteoarthritis, unspecified: Secondary | ICD-10-CM | POA: Diagnosis not present

## 2017-05-02 DIAGNOSIS — Z1389 Encounter for screening for other disorder: Secondary | ICD-10-CM | POA: Diagnosis not present

## 2017-05-02 DIAGNOSIS — D649 Anemia, unspecified: Secondary | ICD-10-CM | POA: Diagnosis not present

## 2017-05-02 DIAGNOSIS — M25551 Pain in right hip: Secondary | ICD-10-CM | POA: Diagnosis not present

## 2017-05-02 DIAGNOSIS — E0590 Thyrotoxicosis, unspecified without thyrotoxic crisis or storm: Secondary | ICD-10-CM | POA: Diagnosis not present

## 2017-05-21 DIAGNOSIS — I10 Essential (primary) hypertension: Secondary | ICD-10-CM | POA: Diagnosis not present

## 2017-05-21 DIAGNOSIS — M159 Polyosteoarthritis, unspecified: Secondary | ICD-10-CM | POA: Diagnosis not present

## 2017-05-21 DIAGNOSIS — R945 Abnormal results of liver function studies: Secondary | ICD-10-CM | POA: Diagnosis not present

## 2017-05-21 DIAGNOSIS — E1165 Type 2 diabetes mellitus with hyperglycemia: Secondary | ICD-10-CM | POA: Diagnosis not present

## 2017-05-21 DIAGNOSIS — E7800 Pure hypercholesterolemia, unspecified: Secondary | ICD-10-CM | POA: Diagnosis not present

## 2017-05-21 DIAGNOSIS — J0101 Acute recurrent maxillary sinusitis: Secondary | ICD-10-CM | POA: Diagnosis not present

## 2017-05-21 DIAGNOSIS — D649 Anemia, unspecified: Secondary | ICD-10-CM | POA: Diagnosis not present

## 2017-05-21 DIAGNOSIS — R69 Illness, unspecified: Secondary | ICD-10-CM | POA: Diagnosis not present

## 2017-05-21 DIAGNOSIS — R609 Edema, unspecified: Secondary | ICD-10-CM | POA: Diagnosis not present

## 2017-05-21 DIAGNOSIS — E0590 Thyrotoxicosis, unspecified without thyrotoxic crisis or storm: Secondary | ICD-10-CM | POA: Diagnosis not present

## 2017-05-30 DIAGNOSIS — E0590 Thyrotoxicosis, unspecified without thyrotoxic crisis or storm: Secondary | ICD-10-CM | POA: Diagnosis not present

## 2017-05-30 DIAGNOSIS — E1165 Type 2 diabetes mellitus with hyperglycemia: Secondary | ICD-10-CM | POA: Diagnosis not present

## 2017-05-30 DIAGNOSIS — E7800 Pure hypercholesterolemia, unspecified: Secondary | ICD-10-CM | POA: Diagnosis not present

## 2017-05-30 DIAGNOSIS — Z23 Encounter for immunization: Secondary | ICD-10-CM | POA: Diagnosis not present

## 2017-05-30 DIAGNOSIS — R609 Edema, unspecified: Secondary | ICD-10-CM | POA: Diagnosis not present

## 2017-05-30 DIAGNOSIS — M159 Polyosteoarthritis, unspecified: Secondary | ICD-10-CM | POA: Diagnosis not present

## 2017-05-30 DIAGNOSIS — R69 Illness, unspecified: Secondary | ICD-10-CM | POA: Diagnosis not present

## 2017-05-30 DIAGNOSIS — I10 Essential (primary) hypertension: Secondary | ICD-10-CM | POA: Diagnosis not present

## 2017-05-30 DIAGNOSIS — R945 Abnormal results of liver function studies: Secondary | ICD-10-CM | POA: Diagnosis not present

## 2017-05-30 DIAGNOSIS — D649 Anemia, unspecified: Secondary | ICD-10-CM | POA: Diagnosis not present

## 2017-06-28 DIAGNOSIS — R945 Abnormal results of liver function studies: Secondary | ICD-10-CM | POA: Diagnosis not present

## 2017-06-28 DIAGNOSIS — M159 Polyosteoarthritis, unspecified: Secondary | ICD-10-CM | POA: Diagnosis not present

## 2017-06-28 DIAGNOSIS — D649 Anemia, unspecified: Secondary | ICD-10-CM | POA: Diagnosis not present

## 2017-06-28 DIAGNOSIS — R69 Illness, unspecified: Secondary | ICD-10-CM | POA: Diagnosis not present

## 2017-06-28 DIAGNOSIS — J309 Allergic rhinitis, unspecified: Secondary | ICD-10-CM | POA: Diagnosis not present

## 2017-06-28 DIAGNOSIS — R609 Edema, unspecified: Secondary | ICD-10-CM | POA: Diagnosis not present

## 2017-06-28 DIAGNOSIS — I10 Essential (primary) hypertension: Secondary | ICD-10-CM | POA: Diagnosis not present

## 2017-06-28 DIAGNOSIS — E1165 Type 2 diabetes mellitus with hyperglycemia: Secondary | ICD-10-CM | POA: Diagnosis not present

## 2017-06-28 DIAGNOSIS — E7800 Pure hypercholesterolemia, unspecified: Secondary | ICD-10-CM | POA: Diagnosis not present

## 2017-06-28 DIAGNOSIS — E0590 Thyrotoxicosis, unspecified without thyrotoxic crisis or storm: Secondary | ICD-10-CM | POA: Diagnosis not present

## 2017-07-26 DIAGNOSIS — D649 Anemia, unspecified: Secondary | ICD-10-CM | POA: Diagnosis not present

## 2017-07-26 DIAGNOSIS — R609 Edema, unspecified: Secondary | ICD-10-CM | POA: Diagnosis not present

## 2017-07-26 DIAGNOSIS — R945 Abnormal results of liver function studies: Secondary | ICD-10-CM | POA: Diagnosis not present

## 2017-07-26 DIAGNOSIS — R69 Illness, unspecified: Secondary | ICD-10-CM | POA: Diagnosis not present

## 2017-07-26 DIAGNOSIS — J309 Allergic rhinitis, unspecified: Secondary | ICD-10-CM | POA: Diagnosis not present

## 2017-07-26 DIAGNOSIS — M25551 Pain in right hip: Secondary | ICD-10-CM | POA: Diagnosis not present

## 2017-07-26 DIAGNOSIS — E1165 Type 2 diabetes mellitus with hyperglycemia: Secondary | ICD-10-CM | POA: Diagnosis not present

## 2017-07-26 DIAGNOSIS — E0590 Thyrotoxicosis, unspecified without thyrotoxic crisis or storm: Secondary | ICD-10-CM | POA: Diagnosis not present

## 2017-07-26 DIAGNOSIS — E7800 Pure hypercholesterolemia, unspecified: Secondary | ICD-10-CM | POA: Diagnosis not present

## 2017-07-26 DIAGNOSIS — M159 Polyosteoarthritis, unspecified: Secondary | ICD-10-CM | POA: Diagnosis not present

## 2017-08-08 DIAGNOSIS — M159 Polyosteoarthritis, unspecified: Secondary | ICD-10-CM | POA: Diagnosis not present

## 2017-08-08 DIAGNOSIS — E0590 Thyrotoxicosis, unspecified without thyrotoxic crisis or storm: Secondary | ICD-10-CM | POA: Diagnosis not present

## 2017-08-08 DIAGNOSIS — J208 Acute bronchitis due to other specified organisms: Secondary | ICD-10-CM | POA: Diagnosis not present

## 2017-08-08 DIAGNOSIS — D649 Anemia, unspecified: Secondary | ICD-10-CM | POA: Diagnosis not present

## 2017-08-08 DIAGNOSIS — R945 Abnormal results of liver function studies: Secondary | ICD-10-CM | POA: Diagnosis not present

## 2017-08-08 DIAGNOSIS — R609 Edema, unspecified: Secondary | ICD-10-CM | POA: Diagnosis not present

## 2017-08-08 DIAGNOSIS — J309 Allergic rhinitis, unspecified: Secondary | ICD-10-CM | POA: Diagnosis not present

## 2017-08-08 DIAGNOSIS — E7800 Pure hypercholesterolemia, unspecified: Secondary | ICD-10-CM | POA: Diagnosis not present

## 2017-08-08 DIAGNOSIS — R69 Illness, unspecified: Secondary | ICD-10-CM | POA: Diagnosis not present

## 2017-08-08 DIAGNOSIS — E1165 Type 2 diabetes mellitus with hyperglycemia: Secondary | ICD-10-CM | POA: Diagnosis not present

## 2017-08-22 DIAGNOSIS — I10 Essential (primary) hypertension: Secondary | ICD-10-CM | POA: Diagnosis not present

## 2017-08-23 DIAGNOSIS — R945 Abnormal results of liver function studies: Secondary | ICD-10-CM | POA: Diagnosis not present

## 2017-08-23 DIAGNOSIS — M159 Polyosteoarthritis, unspecified: Secondary | ICD-10-CM | POA: Diagnosis not present

## 2017-08-23 DIAGNOSIS — J309 Allergic rhinitis, unspecified: Secondary | ICD-10-CM | POA: Diagnosis not present

## 2017-08-23 DIAGNOSIS — E1165 Type 2 diabetes mellitus with hyperglycemia: Secondary | ICD-10-CM | POA: Diagnosis not present

## 2017-08-23 DIAGNOSIS — R69 Illness, unspecified: Secondary | ICD-10-CM | POA: Diagnosis not present

## 2017-08-23 DIAGNOSIS — E0590 Thyrotoxicosis, unspecified without thyrotoxic crisis or storm: Secondary | ICD-10-CM | POA: Diagnosis not present

## 2017-08-23 DIAGNOSIS — M25552 Pain in left hip: Secondary | ICD-10-CM | POA: Diagnosis not present

## 2017-08-23 DIAGNOSIS — E7800 Pure hypercholesterolemia, unspecified: Secondary | ICD-10-CM | POA: Diagnosis not present

## 2017-08-23 DIAGNOSIS — D649 Anemia, unspecified: Secondary | ICD-10-CM | POA: Diagnosis not present

## 2017-08-23 DIAGNOSIS — R609 Edema, unspecified: Secondary | ICD-10-CM | POA: Diagnosis not present

## 2017-08-28 DIAGNOSIS — Z1211 Encounter for screening for malignant neoplasm of colon: Secondary | ICD-10-CM | POA: Diagnosis not present

## 2017-08-28 DIAGNOSIS — Z800 Family history of malignant neoplasm of digestive organs: Secondary | ICD-10-CM | POA: Diagnosis not present

## 2017-09-05 DIAGNOSIS — E0590 Thyrotoxicosis, unspecified without thyrotoxic crisis or storm: Secondary | ICD-10-CM | POA: Diagnosis not present

## 2017-09-05 DIAGNOSIS — R945 Abnormal results of liver function studies: Secondary | ICD-10-CM | POA: Diagnosis not present

## 2017-09-05 DIAGNOSIS — R609 Edema, unspecified: Secondary | ICD-10-CM | POA: Diagnosis not present

## 2017-09-05 DIAGNOSIS — E1165 Type 2 diabetes mellitus with hyperglycemia: Secondary | ICD-10-CM | POA: Diagnosis not present

## 2017-09-05 DIAGNOSIS — D649 Anemia, unspecified: Secondary | ICD-10-CM | POA: Diagnosis not present

## 2017-09-05 DIAGNOSIS — J309 Allergic rhinitis, unspecified: Secondary | ICD-10-CM | POA: Diagnosis not present

## 2017-09-05 DIAGNOSIS — M159 Polyosteoarthritis, unspecified: Secondary | ICD-10-CM | POA: Diagnosis not present

## 2017-09-05 DIAGNOSIS — I10 Essential (primary) hypertension: Secondary | ICD-10-CM | POA: Diagnosis not present

## 2017-09-05 DIAGNOSIS — R69 Illness, unspecified: Secondary | ICD-10-CM | POA: Diagnosis not present

## 2017-09-05 DIAGNOSIS — E7800 Pure hypercholesterolemia, unspecified: Secondary | ICD-10-CM | POA: Diagnosis not present

## 2017-09-19 DIAGNOSIS — R609 Edema, unspecified: Secondary | ICD-10-CM | POA: Diagnosis not present

## 2017-09-19 DIAGNOSIS — D649 Anemia, unspecified: Secondary | ICD-10-CM | POA: Diagnosis not present

## 2017-09-19 DIAGNOSIS — E0590 Thyrotoxicosis, unspecified without thyrotoxic crisis or storm: Secondary | ICD-10-CM | POA: Diagnosis not present

## 2017-09-19 DIAGNOSIS — R809 Proteinuria, unspecified: Secondary | ICD-10-CM | POA: Diagnosis not present

## 2017-09-19 DIAGNOSIS — R945 Abnormal results of liver function studies: Secondary | ICD-10-CM | POA: Diagnosis not present

## 2017-09-19 DIAGNOSIS — E7800 Pure hypercholesterolemia, unspecified: Secondary | ICD-10-CM | POA: Diagnosis not present

## 2017-09-19 DIAGNOSIS — I10 Essential (primary) hypertension: Secondary | ICD-10-CM | POA: Diagnosis not present

## 2017-09-19 DIAGNOSIS — E1165 Type 2 diabetes mellitus with hyperglycemia: Secondary | ICD-10-CM | POA: Diagnosis not present

## 2017-09-19 DIAGNOSIS — R69 Illness, unspecified: Secondary | ICD-10-CM | POA: Diagnosis not present

## 2017-09-19 DIAGNOSIS — J309 Allergic rhinitis, unspecified: Secondary | ICD-10-CM | POA: Diagnosis not present

## 2017-09-24 DIAGNOSIS — D649 Anemia, unspecified: Secondary | ICD-10-CM | POA: Diagnosis not present

## 2017-09-24 DIAGNOSIS — I10 Essential (primary) hypertension: Secondary | ICD-10-CM | POA: Diagnosis not present

## 2017-09-24 DIAGNOSIS — J0101 Acute recurrent maxillary sinusitis: Secondary | ICD-10-CM | POA: Diagnosis not present

## 2017-09-24 DIAGNOSIS — R609 Edema, unspecified: Secondary | ICD-10-CM | POA: Diagnosis not present

## 2017-09-24 DIAGNOSIS — E7800 Pure hypercholesterolemia, unspecified: Secondary | ICD-10-CM | POA: Diagnosis not present

## 2017-09-24 DIAGNOSIS — R69 Illness, unspecified: Secondary | ICD-10-CM | POA: Diagnosis not present

## 2017-09-24 DIAGNOSIS — E119 Type 2 diabetes mellitus without complications: Secondary | ICD-10-CM | POA: Diagnosis not present

## 2017-09-24 DIAGNOSIS — J309 Allergic rhinitis, unspecified: Secondary | ICD-10-CM | POA: Diagnosis not present

## 2017-09-24 DIAGNOSIS — R945 Abnormal results of liver function studies: Secondary | ICD-10-CM | POA: Diagnosis not present

## 2017-09-24 DIAGNOSIS — E0590 Thyrotoxicosis, unspecified without thyrotoxic crisis or storm: Secondary | ICD-10-CM | POA: Diagnosis not present

## 2017-10-17 DIAGNOSIS — J309 Allergic rhinitis, unspecified: Secondary | ICD-10-CM | POA: Diagnosis not present

## 2017-10-17 DIAGNOSIS — E7800 Pure hypercholesterolemia, unspecified: Secondary | ICD-10-CM | POA: Diagnosis not present

## 2017-10-17 DIAGNOSIS — E119 Type 2 diabetes mellitus without complications: Secondary | ICD-10-CM | POA: Diagnosis not present

## 2017-10-17 DIAGNOSIS — R69 Illness, unspecified: Secondary | ICD-10-CM | POA: Diagnosis not present

## 2017-10-17 DIAGNOSIS — R609 Edema, unspecified: Secondary | ICD-10-CM | POA: Diagnosis not present

## 2017-10-17 DIAGNOSIS — M25552 Pain in left hip: Secondary | ICD-10-CM | POA: Diagnosis not present

## 2017-10-17 DIAGNOSIS — E0590 Thyrotoxicosis, unspecified without thyrotoxic crisis or storm: Secondary | ICD-10-CM | POA: Diagnosis not present

## 2017-10-17 DIAGNOSIS — R945 Abnormal results of liver function studies: Secondary | ICD-10-CM | POA: Diagnosis not present

## 2017-10-17 DIAGNOSIS — M159 Polyosteoarthritis, unspecified: Secondary | ICD-10-CM | POA: Diagnosis not present

## 2017-10-17 DIAGNOSIS — I10 Essential (primary) hypertension: Secondary | ICD-10-CM | POA: Diagnosis not present

## 2017-10-18 DIAGNOSIS — Z1231 Encounter for screening mammogram for malignant neoplasm of breast: Secondary | ICD-10-CM | POA: Diagnosis not present

## 2017-10-23 DIAGNOSIS — J309 Allergic rhinitis, unspecified: Secondary | ICD-10-CM | POA: Diagnosis not present

## 2017-10-23 DIAGNOSIS — R609 Edema, unspecified: Secondary | ICD-10-CM | POA: Diagnosis not present

## 2017-10-23 DIAGNOSIS — E0590 Thyrotoxicosis, unspecified without thyrotoxic crisis or storm: Secondary | ICD-10-CM | POA: Diagnosis not present

## 2017-10-23 DIAGNOSIS — E119 Type 2 diabetes mellitus without complications: Secondary | ICD-10-CM | POA: Diagnosis not present

## 2017-10-23 DIAGNOSIS — R69 Illness, unspecified: Secondary | ICD-10-CM | POA: Diagnosis not present

## 2017-10-23 DIAGNOSIS — E7800 Pure hypercholesterolemia, unspecified: Secondary | ICD-10-CM | POA: Diagnosis not present

## 2017-10-23 DIAGNOSIS — M25552 Pain in left hip: Secondary | ICD-10-CM | POA: Diagnosis not present

## 2017-10-23 DIAGNOSIS — R945 Abnormal results of liver function studies: Secondary | ICD-10-CM | POA: Diagnosis not present

## 2017-10-23 DIAGNOSIS — D649 Anemia, unspecified: Secondary | ICD-10-CM | POA: Diagnosis not present

## 2017-10-23 DIAGNOSIS — I10 Essential (primary) hypertension: Secondary | ICD-10-CM | POA: Diagnosis not present

## 2017-10-23 DIAGNOSIS — M159 Polyosteoarthritis, unspecified: Secondary | ICD-10-CM | POA: Diagnosis not present

## 2017-11-14 DIAGNOSIS — R69 Illness, unspecified: Secondary | ICD-10-CM | POA: Diagnosis not present

## 2017-11-14 DIAGNOSIS — J309 Allergic rhinitis, unspecified: Secondary | ICD-10-CM | POA: Diagnosis not present

## 2017-11-14 DIAGNOSIS — E7800 Pure hypercholesterolemia, unspecified: Secondary | ICD-10-CM | POA: Diagnosis not present

## 2017-11-14 DIAGNOSIS — D649 Anemia, unspecified: Secondary | ICD-10-CM | POA: Diagnosis not present

## 2017-11-14 DIAGNOSIS — R809 Proteinuria, unspecified: Secondary | ICD-10-CM | POA: Diagnosis not present

## 2017-11-14 DIAGNOSIS — R945 Abnormal results of liver function studies: Secondary | ICD-10-CM | POA: Diagnosis not present

## 2017-11-14 DIAGNOSIS — M25552 Pain in left hip: Secondary | ICD-10-CM | POA: Diagnosis not present

## 2017-11-14 DIAGNOSIS — E0590 Thyrotoxicosis, unspecified without thyrotoxic crisis or storm: Secondary | ICD-10-CM | POA: Diagnosis not present

## 2017-11-14 DIAGNOSIS — I10 Essential (primary) hypertension: Secondary | ICD-10-CM | POA: Diagnosis not present

## 2017-11-14 DIAGNOSIS — E119 Type 2 diabetes mellitus without complications: Secondary | ICD-10-CM | POA: Diagnosis not present

## 2017-12-07 DIAGNOSIS — J209 Acute bronchitis, unspecified: Secondary | ICD-10-CM | POA: Diagnosis not present

## 2017-12-07 DIAGNOSIS — J069 Acute upper respiratory infection, unspecified: Secondary | ICD-10-CM | POA: Diagnosis not present

## 2017-12-07 DIAGNOSIS — R0602 Shortness of breath: Secondary | ICD-10-CM | POA: Diagnosis not present

## 2017-12-09 DIAGNOSIS — R945 Abnormal results of liver function studies: Secondary | ICD-10-CM | POA: Diagnosis not present

## 2017-12-09 DIAGNOSIS — D649 Anemia, unspecified: Secondary | ICD-10-CM | POA: Diagnosis not present

## 2017-12-09 DIAGNOSIS — I10 Essential (primary) hypertension: Secondary | ICD-10-CM | POA: Diagnosis not present

## 2017-12-09 DIAGNOSIS — Z1231 Encounter for screening mammogram for malignant neoplasm of breast: Secondary | ICD-10-CM | POA: Diagnosis not present

## 2017-12-09 DIAGNOSIS — E7800 Pure hypercholesterolemia, unspecified: Secondary | ICD-10-CM | POA: Diagnosis not present

## 2017-12-09 DIAGNOSIS — R809 Proteinuria, unspecified: Secondary | ICD-10-CM | POA: Diagnosis not present

## 2017-12-09 DIAGNOSIS — J309 Allergic rhinitis, unspecified: Secondary | ICD-10-CM | POA: Diagnosis not present

## 2017-12-09 DIAGNOSIS — E119 Type 2 diabetes mellitus without complications: Secondary | ICD-10-CM | POA: Diagnosis not present

## 2017-12-09 DIAGNOSIS — J208 Acute bronchitis due to other specified organisms: Secondary | ICD-10-CM | POA: Diagnosis not present

## 2017-12-09 DIAGNOSIS — E0590 Thyrotoxicosis, unspecified without thyrotoxic crisis or storm: Secondary | ICD-10-CM | POA: Diagnosis not present

## 2017-12-12 DIAGNOSIS — R809 Proteinuria, unspecified: Secondary | ICD-10-CM | POA: Diagnosis not present

## 2017-12-12 DIAGNOSIS — R945 Abnormal results of liver function studies: Secondary | ICD-10-CM | POA: Diagnosis not present

## 2017-12-12 DIAGNOSIS — J309 Allergic rhinitis, unspecified: Secondary | ICD-10-CM | POA: Diagnosis not present

## 2017-12-12 DIAGNOSIS — D649 Anemia, unspecified: Secondary | ICD-10-CM | POA: Diagnosis not present

## 2017-12-12 DIAGNOSIS — N189 Chronic kidney disease, unspecified: Secondary | ICD-10-CM | POA: Diagnosis not present

## 2017-12-12 DIAGNOSIS — E119 Type 2 diabetes mellitus without complications: Secondary | ICD-10-CM | POA: Diagnosis not present

## 2017-12-12 DIAGNOSIS — R69 Illness, unspecified: Secondary | ICD-10-CM | POA: Diagnosis not present

## 2017-12-12 DIAGNOSIS — I10 Essential (primary) hypertension: Secondary | ICD-10-CM | POA: Diagnosis not present

## 2017-12-12 DIAGNOSIS — E0590 Thyrotoxicosis, unspecified without thyrotoxic crisis or storm: Secondary | ICD-10-CM | POA: Diagnosis not present

## 2017-12-12 DIAGNOSIS — E7800 Pure hypercholesterolemia, unspecified: Secondary | ICD-10-CM | POA: Diagnosis not present

## 2017-12-19 DIAGNOSIS — Z0100 Encounter for examination of eyes and vision without abnormal findings: Secondary | ICD-10-CM | POA: Diagnosis not present

## 2017-12-19 DIAGNOSIS — H5203 Hypermetropia, bilateral: Secondary | ICD-10-CM | POA: Diagnosis not present

## 2018-01-09 DIAGNOSIS — R945 Abnormal results of liver function studies: Secondary | ICD-10-CM | POA: Diagnosis not present

## 2018-01-09 DIAGNOSIS — R809 Proteinuria, unspecified: Secondary | ICD-10-CM | POA: Diagnosis not present

## 2018-01-09 DIAGNOSIS — E7800 Pure hypercholesterolemia, unspecified: Secondary | ICD-10-CM | POA: Diagnosis not present

## 2018-01-09 DIAGNOSIS — I10 Essential (primary) hypertension: Secondary | ICD-10-CM | POA: Diagnosis not present

## 2018-01-09 DIAGNOSIS — M159 Polyosteoarthritis, unspecified: Secondary | ICD-10-CM | POA: Diagnosis not present

## 2018-01-09 DIAGNOSIS — R69 Illness, unspecified: Secondary | ICD-10-CM | POA: Diagnosis not present

## 2018-01-09 DIAGNOSIS — E0590 Thyrotoxicosis, unspecified without thyrotoxic crisis or storm: Secondary | ICD-10-CM | POA: Diagnosis not present

## 2018-01-09 DIAGNOSIS — J309 Allergic rhinitis, unspecified: Secondary | ICD-10-CM | POA: Diagnosis not present

## 2018-01-09 DIAGNOSIS — D649 Anemia, unspecified: Secondary | ICD-10-CM | POA: Diagnosis not present

## 2018-01-09 DIAGNOSIS — E119 Type 2 diabetes mellitus without complications: Secondary | ICD-10-CM | POA: Diagnosis not present

## 2018-02-06 DIAGNOSIS — E0590 Thyrotoxicosis, unspecified without thyrotoxic crisis or storm: Secondary | ICD-10-CM | POA: Diagnosis not present

## 2018-02-06 DIAGNOSIS — R945 Abnormal results of liver function studies: Secondary | ICD-10-CM | POA: Diagnosis not present

## 2018-02-06 DIAGNOSIS — R69 Illness, unspecified: Secondary | ICD-10-CM | POA: Diagnosis not present

## 2018-02-06 DIAGNOSIS — E7800 Pure hypercholesterolemia, unspecified: Secondary | ICD-10-CM | POA: Diagnosis not present

## 2018-02-06 DIAGNOSIS — I10 Essential (primary) hypertension: Secondary | ICD-10-CM | POA: Diagnosis not present

## 2018-02-06 DIAGNOSIS — E119 Type 2 diabetes mellitus without complications: Secondary | ICD-10-CM | POA: Diagnosis not present

## 2018-02-06 DIAGNOSIS — Z1339 Encounter for screening examination for other mental health and behavioral disorders: Secondary | ICD-10-CM | POA: Diagnosis not present

## 2018-02-06 DIAGNOSIS — J309 Allergic rhinitis, unspecified: Secondary | ICD-10-CM | POA: Diagnosis not present

## 2018-02-06 DIAGNOSIS — M159 Polyosteoarthritis, unspecified: Secondary | ICD-10-CM | POA: Diagnosis not present

## 2018-02-06 DIAGNOSIS — D509 Iron deficiency anemia, unspecified: Secondary | ICD-10-CM | POA: Diagnosis not present

## 2018-02-18 DIAGNOSIS — M25552 Pain in left hip: Secondary | ICD-10-CM | POA: Diagnosis not present

## 2018-02-18 DIAGNOSIS — E7800 Pure hypercholesterolemia, unspecified: Secondary | ICD-10-CM | POA: Diagnosis not present

## 2018-02-18 DIAGNOSIS — E119 Type 2 diabetes mellitus without complications: Secondary | ICD-10-CM | POA: Diagnosis not present

## 2018-02-18 DIAGNOSIS — E0590 Thyrotoxicosis, unspecified without thyrotoxic crisis or storm: Secondary | ICD-10-CM | POA: Diagnosis not present

## 2018-02-18 DIAGNOSIS — R809 Proteinuria, unspecified: Secondary | ICD-10-CM | POA: Diagnosis not present

## 2018-02-18 DIAGNOSIS — D509 Iron deficiency anemia, unspecified: Secondary | ICD-10-CM | POA: Diagnosis not present

## 2018-02-18 DIAGNOSIS — R945 Abnormal results of liver function studies: Secondary | ICD-10-CM | POA: Diagnosis not present

## 2018-02-18 DIAGNOSIS — I10 Essential (primary) hypertension: Secondary | ICD-10-CM | POA: Diagnosis not present

## 2018-02-18 DIAGNOSIS — J309 Allergic rhinitis, unspecified: Secondary | ICD-10-CM | POA: Diagnosis not present

## 2018-02-18 DIAGNOSIS — Z1339 Encounter for screening examination for other mental health and behavioral disorders: Secondary | ICD-10-CM | POA: Diagnosis not present

## 2018-02-25 DIAGNOSIS — R809 Proteinuria, unspecified: Secondary | ICD-10-CM | POA: Diagnosis not present

## 2018-02-25 DIAGNOSIS — R945 Abnormal results of liver function studies: Secondary | ICD-10-CM | POA: Diagnosis not present

## 2018-02-25 DIAGNOSIS — M159 Polyosteoarthritis, unspecified: Secondary | ICD-10-CM | POA: Diagnosis not present

## 2018-02-25 DIAGNOSIS — E119 Type 2 diabetes mellitus without complications: Secondary | ICD-10-CM | POA: Diagnosis not present

## 2018-02-25 DIAGNOSIS — I10 Essential (primary) hypertension: Secondary | ICD-10-CM | POA: Diagnosis not present

## 2018-02-25 DIAGNOSIS — J309 Allergic rhinitis, unspecified: Secondary | ICD-10-CM | POA: Diagnosis not present

## 2018-02-25 DIAGNOSIS — E7800 Pure hypercholesterolemia, unspecified: Secondary | ICD-10-CM | POA: Diagnosis not present

## 2018-02-25 DIAGNOSIS — R69 Illness, unspecified: Secondary | ICD-10-CM | POA: Diagnosis not present

## 2018-02-25 DIAGNOSIS — E0590 Thyrotoxicosis, unspecified without thyrotoxic crisis or storm: Secondary | ICD-10-CM | POA: Diagnosis not present

## 2018-02-25 DIAGNOSIS — D509 Iron deficiency anemia, unspecified: Secondary | ICD-10-CM | POA: Diagnosis not present

## 2018-02-28 DIAGNOSIS — J309 Allergic rhinitis, unspecified: Secondary | ICD-10-CM | POA: Diagnosis not present

## 2018-02-28 DIAGNOSIS — R945 Abnormal results of liver function studies: Secondary | ICD-10-CM | POA: Diagnosis not present

## 2018-02-28 DIAGNOSIS — I10 Essential (primary) hypertension: Secondary | ICD-10-CM | POA: Diagnosis not present

## 2018-02-28 DIAGNOSIS — E119 Type 2 diabetes mellitus without complications: Secondary | ICD-10-CM | POA: Diagnosis not present

## 2018-02-28 DIAGNOSIS — N189 Chronic kidney disease, unspecified: Secondary | ICD-10-CM | POA: Diagnosis not present

## 2018-02-28 DIAGNOSIS — R609 Edema, unspecified: Secondary | ICD-10-CM | POA: Diagnosis not present

## 2018-02-28 DIAGNOSIS — E875 Hyperkalemia: Secondary | ICD-10-CM | POA: Diagnosis not present

## 2018-02-28 DIAGNOSIS — M159 Polyosteoarthritis, unspecified: Secondary | ICD-10-CM | POA: Diagnosis not present

## 2018-02-28 DIAGNOSIS — E0590 Thyrotoxicosis, unspecified without thyrotoxic crisis or storm: Secondary | ICD-10-CM | POA: Diagnosis not present

## 2018-02-28 DIAGNOSIS — R809 Proteinuria, unspecified: Secondary | ICD-10-CM | POA: Diagnosis not present

## 2018-03-06 DIAGNOSIS — I10 Essential (primary) hypertension: Secondary | ICD-10-CM | POA: Diagnosis not present

## 2018-03-06 DIAGNOSIS — N189 Chronic kidney disease, unspecified: Secondary | ICD-10-CM | POA: Diagnosis not present

## 2018-03-06 DIAGNOSIS — R809 Proteinuria, unspecified: Secondary | ICD-10-CM | POA: Diagnosis not present

## 2018-03-06 DIAGNOSIS — J309 Allergic rhinitis, unspecified: Secondary | ICD-10-CM | POA: Diagnosis not present

## 2018-03-06 DIAGNOSIS — E875 Hyperkalemia: Secondary | ICD-10-CM | POA: Diagnosis not present

## 2018-03-06 DIAGNOSIS — E0590 Thyrotoxicosis, unspecified without thyrotoxic crisis or storm: Secondary | ICD-10-CM | POA: Diagnosis not present

## 2018-03-06 DIAGNOSIS — E119 Type 2 diabetes mellitus without complications: Secondary | ICD-10-CM | POA: Diagnosis not present

## 2018-03-06 DIAGNOSIS — R609 Edema, unspecified: Secondary | ICD-10-CM | POA: Diagnosis not present

## 2018-03-06 DIAGNOSIS — R945 Abnormal results of liver function studies: Secondary | ICD-10-CM | POA: Diagnosis not present

## 2018-03-06 DIAGNOSIS — M25552 Pain in left hip: Secondary | ICD-10-CM | POA: Diagnosis not present

## 2018-03-28 DIAGNOSIS — I10 Essential (primary) hypertension: Secondary | ICD-10-CM | POA: Diagnosis not present

## 2018-03-28 DIAGNOSIS — J309 Allergic rhinitis, unspecified: Secondary | ICD-10-CM | POA: Diagnosis not present

## 2018-03-28 DIAGNOSIS — E0590 Thyrotoxicosis, unspecified without thyrotoxic crisis or storm: Secondary | ICD-10-CM | POA: Diagnosis not present

## 2018-03-28 DIAGNOSIS — M25552 Pain in left hip: Secondary | ICD-10-CM | POA: Diagnosis not present

## 2018-03-28 DIAGNOSIS — R809 Proteinuria, unspecified: Secondary | ICD-10-CM | POA: Diagnosis not present

## 2018-03-28 DIAGNOSIS — N189 Chronic kidney disease, unspecified: Secondary | ICD-10-CM | POA: Diagnosis not present

## 2018-03-28 DIAGNOSIS — M159 Polyosteoarthritis, unspecified: Secondary | ICD-10-CM | POA: Diagnosis not present

## 2018-03-28 DIAGNOSIS — E875 Hyperkalemia: Secondary | ICD-10-CM | POA: Diagnosis not present

## 2018-03-28 DIAGNOSIS — R945 Abnormal results of liver function studies: Secondary | ICD-10-CM | POA: Diagnosis not present

## 2018-03-28 DIAGNOSIS — R609 Edema, unspecified: Secondary | ICD-10-CM | POA: Diagnosis not present

## 2018-04-01 DIAGNOSIS — I10 Essential (primary) hypertension: Secondary | ICD-10-CM | POA: Diagnosis not present

## 2018-04-01 DIAGNOSIS — J0100 Acute maxillary sinusitis, unspecified: Secondary | ICD-10-CM | POA: Diagnosis not present

## 2018-04-01 DIAGNOSIS — J309 Allergic rhinitis, unspecified: Secondary | ICD-10-CM | POA: Diagnosis not present

## 2018-04-01 DIAGNOSIS — E119 Type 2 diabetes mellitus without complications: Secondary | ICD-10-CM | POA: Diagnosis not present

## 2018-04-01 DIAGNOSIS — D509 Iron deficiency anemia, unspecified: Secondary | ICD-10-CM | POA: Diagnosis not present

## 2018-04-01 DIAGNOSIS — R609 Edema, unspecified: Secondary | ICD-10-CM | POA: Diagnosis not present

## 2018-04-01 DIAGNOSIS — R809 Proteinuria, unspecified: Secondary | ICD-10-CM | POA: Diagnosis not present

## 2018-04-01 DIAGNOSIS — E0590 Thyrotoxicosis, unspecified without thyrotoxic crisis or storm: Secondary | ICD-10-CM | POA: Diagnosis not present

## 2018-04-01 DIAGNOSIS — N189 Chronic kidney disease, unspecified: Secondary | ICD-10-CM | POA: Diagnosis not present

## 2018-04-01 DIAGNOSIS — R945 Abnormal results of liver function studies: Secondary | ICD-10-CM | POA: Diagnosis not present

## 2018-04-08 DIAGNOSIS — R609 Edema, unspecified: Secondary | ICD-10-CM | POA: Diagnosis not present

## 2018-04-08 DIAGNOSIS — E0590 Thyrotoxicosis, unspecified without thyrotoxic crisis or storm: Secondary | ICD-10-CM | POA: Diagnosis not present

## 2018-04-08 DIAGNOSIS — D509 Iron deficiency anemia, unspecified: Secondary | ICD-10-CM | POA: Diagnosis not present

## 2018-04-08 DIAGNOSIS — E119 Type 2 diabetes mellitus without complications: Secondary | ICD-10-CM | POA: Diagnosis not present

## 2018-04-08 DIAGNOSIS — N189 Chronic kidney disease, unspecified: Secondary | ICD-10-CM | POA: Diagnosis not present

## 2018-04-08 DIAGNOSIS — I10 Essential (primary) hypertension: Secondary | ICD-10-CM | POA: Diagnosis not present

## 2018-04-08 DIAGNOSIS — R945 Abnormal results of liver function studies: Secondary | ICD-10-CM | POA: Diagnosis not present

## 2018-04-08 DIAGNOSIS — E875 Hyperkalemia: Secondary | ICD-10-CM | POA: Diagnosis not present

## 2018-04-08 DIAGNOSIS — R809 Proteinuria, unspecified: Secondary | ICD-10-CM | POA: Diagnosis not present

## 2018-04-08 DIAGNOSIS — J309 Allergic rhinitis, unspecified: Secondary | ICD-10-CM | POA: Diagnosis not present

## 2018-04-11 DIAGNOSIS — I10 Essential (primary) hypertension: Secondary | ICD-10-CM | POA: Diagnosis not present

## 2018-04-11 DIAGNOSIS — M25552 Pain in left hip: Secondary | ICD-10-CM | POA: Diagnosis not present

## 2018-04-11 DIAGNOSIS — E119 Type 2 diabetes mellitus without complications: Secondary | ICD-10-CM | POA: Diagnosis not present

## 2018-04-11 DIAGNOSIS — R945 Abnormal results of liver function studies: Secondary | ICD-10-CM | POA: Diagnosis not present

## 2018-04-11 DIAGNOSIS — E875 Hyperkalemia: Secondary | ICD-10-CM | POA: Diagnosis not present

## 2018-04-11 DIAGNOSIS — R609 Edema, unspecified: Secondary | ICD-10-CM | POA: Diagnosis not present

## 2018-04-11 DIAGNOSIS — J309 Allergic rhinitis, unspecified: Secondary | ICD-10-CM | POA: Diagnosis not present

## 2018-04-11 DIAGNOSIS — R809 Proteinuria, unspecified: Secondary | ICD-10-CM | POA: Diagnosis not present

## 2018-04-11 DIAGNOSIS — N189 Chronic kidney disease, unspecified: Secondary | ICD-10-CM | POA: Diagnosis not present

## 2018-04-11 DIAGNOSIS — E0590 Thyrotoxicosis, unspecified without thyrotoxic crisis or storm: Secondary | ICD-10-CM | POA: Diagnosis not present

## 2018-04-22 DIAGNOSIS — N189 Chronic kidney disease, unspecified: Secondary | ICD-10-CM | POA: Diagnosis not present

## 2018-04-22 DIAGNOSIS — R609 Edema, unspecified: Secondary | ICD-10-CM | POA: Diagnosis not present

## 2018-04-22 DIAGNOSIS — E119 Type 2 diabetes mellitus without complications: Secondary | ICD-10-CM | POA: Diagnosis not present

## 2018-04-22 DIAGNOSIS — J309 Allergic rhinitis, unspecified: Secondary | ICD-10-CM | POA: Diagnosis not present

## 2018-04-22 DIAGNOSIS — R945 Abnormal results of liver function studies: Secondary | ICD-10-CM | POA: Diagnosis not present

## 2018-04-22 DIAGNOSIS — I10 Essential (primary) hypertension: Secondary | ICD-10-CM | POA: Diagnosis not present

## 2018-04-22 DIAGNOSIS — E0590 Thyrotoxicosis, unspecified without thyrotoxic crisis or storm: Secondary | ICD-10-CM | POA: Diagnosis not present

## 2018-04-22 DIAGNOSIS — R809 Proteinuria, unspecified: Secondary | ICD-10-CM | POA: Diagnosis not present

## 2018-04-22 DIAGNOSIS — R69 Illness, unspecified: Secondary | ICD-10-CM | POA: Diagnosis not present

## 2018-04-22 DIAGNOSIS — E875 Hyperkalemia: Secondary | ICD-10-CM | POA: Diagnosis not present

## 2018-04-26 DIAGNOSIS — E0590 Thyrotoxicosis, unspecified without thyrotoxic crisis or storm: Secondary | ICD-10-CM | POA: Diagnosis not present

## 2018-04-26 DIAGNOSIS — E875 Hyperkalemia: Secondary | ICD-10-CM | POA: Diagnosis not present

## 2018-04-26 DIAGNOSIS — R609 Edema, unspecified: Secondary | ICD-10-CM | POA: Diagnosis not present

## 2018-04-26 DIAGNOSIS — N189 Chronic kidney disease, unspecified: Secondary | ICD-10-CM | POA: Diagnosis not present

## 2018-04-26 DIAGNOSIS — J0101 Acute recurrent maxillary sinusitis: Secondary | ICD-10-CM | POA: Diagnosis not present

## 2018-04-26 DIAGNOSIS — R945 Abnormal results of liver function studies: Secondary | ICD-10-CM | POA: Diagnosis not present

## 2018-04-26 DIAGNOSIS — R809 Proteinuria, unspecified: Secondary | ICD-10-CM | POA: Diagnosis not present

## 2018-04-26 DIAGNOSIS — E119 Type 2 diabetes mellitus without complications: Secondary | ICD-10-CM | POA: Diagnosis not present

## 2018-04-26 DIAGNOSIS — J309 Allergic rhinitis, unspecified: Secondary | ICD-10-CM | POA: Diagnosis not present

## 2018-04-26 DIAGNOSIS — I10 Essential (primary) hypertension: Secondary | ICD-10-CM | POA: Diagnosis not present

## 2018-05-06 DIAGNOSIS — Z1331 Encounter for screening for depression: Secondary | ICD-10-CM | POA: Diagnosis not present

## 2018-05-06 DIAGNOSIS — E875 Hyperkalemia: Secondary | ICD-10-CM | POA: Diagnosis not present

## 2018-05-06 DIAGNOSIS — R945 Abnormal results of liver function studies: Secondary | ICD-10-CM | POA: Diagnosis not present

## 2018-05-06 DIAGNOSIS — Z23 Encounter for immunization: Secondary | ICD-10-CM | POA: Diagnosis not present

## 2018-05-06 DIAGNOSIS — R609 Edema, unspecified: Secondary | ICD-10-CM | POA: Diagnosis not present

## 2018-05-06 DIAGNOSIS — M25551 Pain in right hip: Secondary | ICD-10-CM | POA: Diagnosis not present

## 2018-05-06 DIAGNOSIS — E0590 Thyrotoxicosis, unspecified without thyrotoxic crisis or storm: Secondary | ICD-10-CM | POA: Diagnosis not present

## 2018-05-06 DIAGNOSIS — R809 Proteinuria, unspecified: Secondary | ICD-10-CM | POA: Diagnosis not present

## 2018-05-06 DIAGNOSIS — J309 Allergic rhinitis, unspecified: Secondary | ICD-10-CM | POA: Diagnosis not present

## 2018-05-06 DIAGNOSIS — I10 Essential (primary) hypertension: Secondary | ICD-10-CM | POA: Diagnosis not present

## 2018-05-19 DIAGNOSIS — E119 Type 2 diabetes mellitus without complications: Secondary | ICD-10-CM | POA: Diagnosis not present

## 2018-05-19 DIAGNOSIS — R609 Edema, unspecified: Secondary | ICD-10-CM | POA: Diagnosis not present

## 2018-05-19 DIAGNOSIS — D509 Iron deficiency anemia, unspecified: Secondary | ICD-10-CM | POA: Diagnosis not present

## 2018-05-19 DIAGNOSIS — E875 Hyperkalemia: Secondary | ICD-10-CM | POA: Diagnosis not present

## 2018-05-19 DIAGNOSIS — J309 Allergic rhinitis, unspecified: Secondary | ICD-10-CM | POA: Diagnosis not present

## 2018-05-19 DIAGNOSIS — R945 Abnormal results of liver function studies: Secondary | ICD-10-CM | POA: Diagnosis not present

## 2018-05-19 DIAGNOSIS — N189 Chronic kidney disease, unspecified: Secondary | ICD-10-CM | POA: Diagnosis not present

## 2018-05-19 DIAGNOSIS — I10 Essential (primary) hypertension: Secondary | ICD-10-CM | POA: Diagnosis not present

## 2018-05-19 DIAGNOSIS — R809 Proteinuria, unspecified: Secondary | ICD-10-CM | POA: Diagnosis not present

## 2018-05-19 DIAGNOSIS — E0590 Thyrotoxicosis, unspecified without thyrotoxic crisis or storm: Secondary | ICD-10-CM | POA: Diagnosis not present

## 2018-06-02 DIAGNOSIS — R609 Edema, unspecified: Secondary | ICD-10-CM | POA: Diagnosis not present

## 2018-06-02 DIAGNOSIS — J309 Allergic rhinitis, unspecified: Secondary | ICD-10-CM | POA: Diagnosis not present

## 2018-06-02 DIAGNOSIS — E0590 Thyrotoxicosis, unspecified without thyrotoxic crisis or storm: Secondary | ICD-10-CM | POA: Diagnosis not present

## 2018-06-02 DIAGNOSIS — R809 Proteinuria, unspecified: Secondary | ICD-10-CM | POA: Diagnosis not present

## 2018-06-02 DIAGNOSIS — E875 Hyperkalemia: Secondary | ICD-10-CM | POA: Diagnosis not present

## 2018-06-02 DIAGNOSIS — M25552 Pain in left hip: Secondary | ICD-10-CM | POA: Diagnosis not present

## 2018-06-02 DIAGNOSIS — I10 Essential (primary) hypertension: Secondary | ICD-10-CM | POA: Diagnosis not present

## 2018-06-02 DIAGNOSIS — R945 Abnormal results of liver function studies: Secondary | ICD-10-CM | POA: Diagnosis not present

## 2018-06-02 DIAGNOSIS — E119 Type 2 diabetes mellitus without complications: Secondary | ICD-10-CM | POA: Diagnosis not present

## 2018-06-02 DIAGNOSIS — N189 Chronic kidney disease, unspecified: Secondary | ICD-10-CM | POA: Diagnosis not present

## 2018-06-10 DIAGNOSIS — E875 Hyperkalemia: Secondary | ICD-10-CM | POA: Diagnosis not present

## 2018-06-10 DIAGNOSIS — R809 Proteinuria, unspecified: Secondary | ICD-10-CM | POA: Diagnosis not present

## 2018-06-10 DIAGNOSIS — D509 Iron deficiency anemia, unspecified: Secondary | ICD-10-CM | POA: Diagnosis not present

## 2018-06-10 DIAGNOSIS — I10 Essential (primary) hypertension: Secondary | ICD-10-CM | POA: Diagnosis not present

## 2018-06-10 DIAGNOSIS — E7800 Pure hypercholesterolemia, unspecified: Secondary | ICD-10-CM | POA: Diagnosis not present

## 2018-06-10 DIAGNOSIS — N189 Chronic kidney disease, unspecified: Secondary | ICD-10-CM | POA: Diagnosis not present

## 2018-06-10 DIAGNOSIS — E0590 Thyrotoxicosis, unspecified without thyrotoxic crisis or storm: Secondary | ICD-10-CM | POA: Diagnosis not present

## 2018-06-10 DIAGNOSIS — R945 Abnormal results of liver function studies: Secondary | ICD-10-CM | POA: Diagnosis not present

## 2018-06-10 DIAGNOSIS — J309 Allergic rhinitis, unspecified: Secondary | ICD-10-CM | POA: Diagnosis not present

## 2018-06-10 DIAGNOSIS — J0100 Acute maxillary sinusitis, unspecified: Secondary | ICD-10-CM | POA: Diagnosis not present

## 2018-06-17 DIAGNOSIS — E119 Type 2 diabetes mellitus without complications: Secondary | ICD-10-CM | POA: Diagnosis not present

## 2018-06-17 DIAGNOSIS — D509 Iron deficiency anemia, unspecified: Secondary | ICD-10-CM | POA: Diagnosis not present

## 2018-06-17 DIAGNOSIS — J309 Allergic rhinitis, unspecified: Secondary | ICD-10-CM | POA: Diagnosis not present

## 2018-06-17 DIAGNOSIS — E0590 Thyrotoxicosis, unspecified without thyrotoxic crisis or storm: Secondary | ICD-10-CM | POA: Diagnosis not present

## 2018-06-17 DIAGNOSIS — J208 Acute bronchitis due to other specified organisms: Secondary | ICD-10-CM | POA: Diagnosis not present

## 2018-06-17 DIAGNOSIS — N189 Chronic kidney disease, unspecified: Secondary | ICD-10-CM | POA: Diagnosis not present

## 2018-06-17 DIAGNOSIS — R809 Proteinuria, unspecified: Secondary | ICD-10-CM | POA: Diagnosis not present

## 2018-06-17 DIAGNOSIS — I10 Essential (primary) hypertension: Secondary | ICD-10-CM | POA: Diagnosis not present

## 2018-06-17 DIAGNOSIS — R609 Edema, unspecified: Secondary | ICD-10-CM | POA: Diagnosis not present

## 2018-06-17 DIAGNOSIS — R945 Abnormal results of liver function studies: Secondary | ICD-10-CM | POA: Diagnosis not present

## 2018-06-27 DIAGNOSIS — E7800 Pure hypercholesterolemia, unspecified: Secondary | ICD-10-CM | POA: Diagnosis not present

## 2018-06-27 DIAGNOSIS — E119 Type 2 diabetes mellitus without complications: Secondary | ICD-10-CM | POA: Diagnosis not present

## 2018-06-27 DIAGNOSIS — J309 Allergic rhinitis, unspecified: Secondary | ICD-10-CM | POA: Diagnosis not present

## 2018-06-27 DIAGNOSIS — I10 Essential (primary) hypertension: Secondary | ICD-10-CM | POA: Diagnosis not present

## 2018-06-27 DIAGNOSIS — D509 Iron deficiency anemia, unspecified: Secondary | ICD-10-CM | POA: Diagnosis not present

## 2018-06-27 DIAGNOSIS — R69 Illness, unspecified: Secondary | ICD-10-CM | POA: Diagnosis not present

## 2018-06-27 DIAGNOSIS — R809 Proteinuria, unspecified: Secondary | ICD-10-CM | POA: Diagnosis not present

## 2018-06-27 DIAGNOSIS — E0590 Thyrotoxicosis, unspecified without thyrotoxic crisis or storm: Secondary | ICD-10-CM | POA: Diagnosis not present

## 2018-06-27 DIAGNOSIS — R945 Abnormal results of liver function studies: Secondary | ICD-10-CM | POA: Diagnosis not present

## 2018-06-27 DIAGNOSIS — N189 Chronic kidney disease, unspecified: Secondary | ICD-10-CM | POA: Diagnosis not present

## 2018-07-08 DIAGNOSIS — R609 Edema, unspecified: Secondary | ICD-10-CM | POA: Diagnosis not present

## 2018-07-08 DIAGNOSIS — N189 Chronic kidney disease, unspecified: Secondary | ICD-10-CM | POA: Diagnosis not present

## 2018-07-08 DIAGNOSIS — R809 Proteinuria, unspecified: Secondary | ICD-10-CM | POA: Diagnosis not present

## 2018-07-08 DIAGNOSIS — I10 Essential (primary) hypertension: Secondary | ICD-10-CM | POA: Diagnosis not present

## 2018-07-08 DIAGNOSIS — D509 Iron deficiency anemia, unspecified: Secondary | ICD-10-CM | POA: Diagnosis not present

## 2018-07-08 DIAGNOSIS — B373 Candidiasis of vulva and vagina: Secondary | ICD-10-CM | POA: Diagnosis not present

## 2018-07-08 DIAGNOSIS — E119 Type 2 diabetes mellitus without complications: Secondary | ICD-10-CM | POA: Diagnosis not present

## 2018-07-08 DIAGNOSIS — J309 Allergic rhinitis, unspecified: Secondary | ICD-10-CM | POA: Diagnosis not present

## 2018-07-08 DIAGNOSIS — R945 Abnormal results of liver function studies: Secondary | ICD-10-CM | POA: Diagnosis not present

## 2018-07-08 DIAGNOSIS — E0590 Thyrotoxicosis, unspecified without thyrotoxic crisis or storm: Secondary | ICD-10-CM | POA: Diagnosis not present

## 2018-08-07 DIAGNOSIS — R69 Illness, unspecified: Secondary | ICD-10-CM | POA: Diagnosis not present

## 2018-08-07 DIAGNOSIS — N189 Chronic kidney disease, unspecified: Secondary | ICD-10-CM | POA: Diagnosis not present

## 2018-08-07 DIAGNOSIS — R609 Edema, unspecified: Secondary | ICD-10-CM | POA: Diagnosis not present

## 2018-08-07 DIAGNOSIS — R945 Abnormal results of liver function studies: Secondary | ICD-10-CM | POA: Diagnosis not present

## 2018-08-07 DIAGNOSIS — J309 Allergic rhinitis, unspecified: Secondary | ICD-10-CM | POA: Diagnosis not present

## 2018-08-07 DIAGNOSIS — E0590 Thyrotoxicosis, unspecified without thyrotoxic crisis or storm: Secondary | ICD-10-CM | POA: Diagnosis not present

## 2018-08-07 DIAGNOSIS — I10 Essential (primary) hypertension: Secondary | ICD-10-CM | POA: Diagnosis not present

## 2018-08-07 DIAGNOSIS — E119 Type 2 diabetes mellitus without complications: Secondary | ICD-10-CM | POA: Diagnosis not present

## 2018-08-07 DIAGNOSIS — R809 Proteinuria, unspecified: Secondary | ICD-10-CM | POA: Diagnosis not present

## 2018-08-07 DIAGNOSIS — M159 Polyosteoarthritis, unspecified: Secondary | ICD-10-CM | POA: Diagnosis not present

## 2018-09-04 DIAGNOSIS — E0590 Thyrotoxicosis, unspecified without thyrotoxic crisis or storm: Secondary | ICD-10-CM | POA: Diagnosis not present

## 2018-09-04 DIAGNOSIS — I10 Essential (primary) hypertension: Secondary | ICD-10-CM | POA: Diagnosis not present

## 2018-09-04 DIAGNOSIS — N189 Chronic kidney disease, unspecified: Secondary | ICD-10-CM | POA: Diagnosis not present

## 2018-09-04 DIAGNOSIS — J309 Allergic rhinitis, unspecified: Secondary | ICD-10-CM | POA: Diagnosis not present

## 2018-09-04 DIAGNOSIS — R69 Illness, unspecified: Secondary | ICD-10-CM | POA: Diagnosis not present

## 2018-09-04 DIAGNOSIS — R945 Abnormal results of liver function studies: Secondary | ICD-10-CM | POA: Diagnosis not present

## 2018-09-04 DIAGNOSIS — M25551 Pain in right hip: Secondary | ICD-10-CM | POA: Diagnosis not present

## 2018-09-04 DIAGNOSIS — R609 Edema, unspecified: Secondary | ICD-10-CM | POA: Diagnosis not present

## 2018-09-04 DIAGNOSIS — M159 Polyosteoarthritis, unspecified: Secondary | ICD-10-CM | POA: Diagnosis not present

## 2018-09-04 DIAGNOSIS — R809 Proteinuria, unspecified: Secondary | ICD-10-CM | POA: Diagnosis not present

## 2018-09-17 DIAGNOSIS — M7061 Trochanteric bursitis, right hip: Secondary | ICD-10-CM | POA: Diagnosis not present

## 2018-09-30 DIAGNOSIS — E119 Type 2 diabetes mellitus without complications: Secondary | ICD-10-CM | POA: Diagnosis not present

## 2018-09-30 DIAGNOSIS — R945 Abnormal results of liver function studies: Secondary | ICD-10-CM | POA: Diagnosis not present

## 2018-09-30 DIAGNOSIS — N189 Chronic kidney disease, unspecified: Secondary | ICD-10-CM | POA: Diagnosis not present

## 2018-09-30 DIAGNOSIS — E7800 Pure hypercholesterolemia, unspecified: Secondary | ICD-10-CM | POA: Diagnosis not present

## 2018-09-30 DIAGNOSIS — J309 Allergic rhinitis, unspecified: Secondary | ICD-10-CM | POA: Diagnosis not present

## 2018-09-30 DIAGNOSIS — I10 Essential (primary) hypertension: Secondary | ICD-10-CM | POA: Diagnosis not present

## 2018-09-30 DIAGNOSIS — M159 Polyosteoarthritis, unspecified: Secondary | ICD-10-CM | POA: Diagnosis not present

## 2018-09-30 DIAGNOSIS — M25551 Pain in right hip: Secondary | ICD-10-CM | POA: Diagnosis not present

## 2018-09-30 DIAGNOSIS — R609 Edema, unspecified: Secondary | ICD-10-CM | POA: Diagnosis not present

## 2018-09-30 DIAGNOSIS — R809 Proteinuria, unspecified: Secondary | ICD-10-CM | POA: Diagnosis not present

## 2018-09-30 DIAGNOSIS — D509 Iron deficiency anemia, unspecified: Secondary | ICD-10-CM | POA: Diagnosis not present

## 2018-09-30 DIAGNOSIS — E0590 Thyrotoxicosis, unspecified without thyrotoxic crisis or storm: Secondary | ICD-10-CM | POA: Diagnosis not present

## 2018-09-30 DIAGNOSIS — R69 Illness, unspecified: Secondary | ICD-10-CM | POA: Diagnosis not present

## 2018-10-02 DIAGNOSIS — E119 Type 2 diabetes mellitus without complications: Secondary | ICD-10-CM | POA: Diagnosis not present

## 2018-10-02 DIAGNOSIS — J309 Allergic rhinitis, unspecified: Secondary | ICD-10-CM | POA: Diagnosis not present

## 2018-10-02 DIAGNOSIS — R945 Abnormal results of liver function studies: Secondary | ICD-10-CM | POA: Diagnosis not present

## 2018-10-02 DIAGNOSIS — N189 Chronic kidney disease, unspecified: Secondary | ICD-10-CM | POA: Diagnosis not present

## 2018-10-02 DIAGNOSIS — D509 Iron deficiency anemia, unspecified: Secondary | ICD-10-CM | POA: Diagnosis not present

## 2018-10-02 DIAGNOSIS — R609 Edema, unspecified: Secondary | ICD-10-CM | POA: Diagnosis not present

## 2018-10-02 DIAGNOSIS — E875 Hyperkalemia: Secondary | ICD-10-CM | POA: Diagnosis not present

## 2018-10-02 DIAGNOSIS — E0590 Thyrotoxicosis, unspecified without thyrotoxic crisis or storm: Secondary | ICD-10-CM | POA: Diagnosis not present

## 2018-10-02 DIAGNOSIS — I10 Essential (primary) hypertension: Secondary | ICD-10-CM | POA: Diagnosis not present

## 2018-10-02 DIAGNOSIS — R809 Proteinuria, unspecified: Secondary | ICD-10-CM | POA: Diagnosis not present

## 2018-10-14 DIAGNOSIS — E119 Type 2 diabetes mellitus without complications: Secondary | ICD-10-CM | POA: Diagnosis not present

## 2018-10-14 DIAGNOSIS — R609 Edema, unspecified: Secondary | ICD-10-CM | POA: Diagnosis not present

## 2018-10-14 DIAGNOSIS — I10 Essential (primary) hypertension: Secondary | ICD-10-CM | POA: Diagnosis not present

## 2018-10-14 DIAGNOSIS — E0590 Thyrotoxicosis, unspecified without thyrotoxic crisis or storm: Secondary | ICD-10-CM | POA: Diagnosis not present

## 2018-10-14 DIAGNOSIS — J309 Allergic rhinitis, unspecified: Secondary | ICD-10-CM | POA: Diagnosis not present

## 2018-10-14 DIAGNOSIS — D509 Iron deficiency anemia, unspecified: Secondary | ICD-10-CM | POA: Diagnosis not present

## 2018-10-14 DIAGNOSIS — E875 Hyperkalemia: Secondary | ICD-10-CM | POA: Diagnosis not present

## 2018-10-14 DIAGNOSIS — R809 Proteinuria, unspecified: Secondary | ICD-10-CM | POA: Diagnosis not present

## 2018-10-14 DIAGNOSIS — N189 Chronic kidney disease, unspecified: Secondary | ICD-10-CM | POA: Diagnosis not present

## 2018-10-14 DIAGNOSIS — R945 Abnormal results of liver function studies: Secondary | ICD-10-CM | POA: Diagnosis not present

## 2018-10-27 DIAGNOSIS — E119 Type 2 diabetes mellitus without complications: Secondary | ICD-10-CM | POA: Diagnosis not present

## 2018-10-27 DIAGNOSIS — E875 Hyperkalemia: Secondary | ICD-10-CM | POA: Diagnosis not present

## 2018-10-27 DIAGNOSIS — R945 Abnormal results of liver function studies: Secondary | ICD-10-CM | POA: Diagnosis not present

## 2018-10-27 DIAGNOSIS — M25552 Pain in left hip: Secondary | ICD-10-CM | POA: Diagnosis not present

## 2018-10-27 DIAGNOSIS — R809 Proteinuria, unspecified: Secondary | ICD-10-CM | POA: Diagnosis not present

## 2018-10-27 DIAGNOSIS — R609 Edema, unspecified: Secondary | ICD-10-CM | POA: Diagnosis not present

## 2018-10-27 DIAGNOSIS — N189 Chronic kidney disease, unspecified: Secondary | ICD-10-CM | POA: Diagnosis not present

## 2018-10-27 DIAGNOSIS — E0590 Thyrotoxicosis, unspecified without thyrotoxic crisis or storm: Secondary | ICD-10-CM | POA: Diagnosis not present

## 2018-10-27 DIAGNOSIS — J309 Allergic rhinitis, unspecified: Secondary | ICD-10-CM | POA: Diagnosis not present

## 2018-10-27 DIAGNOSIS — I10 Essential (primary) hypertension: Secondary | ICD-10-CM | POA: Diagnosis not present

## 2018-11-24 DIAGNOSIS — E119 Type 2 diabetes mellitus without complications: Secondary | ICD-10-CM | POA: Diagnosis not present

## 2018-11-24 DIAGNOSIS — N189 Chronic kidney disease, unspecified: Secondary | ICD-10-CM | POA: Diagnosis not present

## 2018-11-24 DIAGNOSIS — E875 Hyperkalemia: Secondary | ICD-10-CM | POA: Diagnosis not present

## 2018-11-24 DIAGNOSIS — E0590 Thyrotoxicosis, unspecified without thyrotoxic crisis or storm: Secondary | ICD-10-CM | POA: Diagnosis not present

## 2018-11-24 DIAGNOSIS — I10 Essential (primary) hypertension: Secondary | ICD-10-CM | POA: Diagnosis not present

## 2018-11-24 DIAGNOSIS — R809 Proteinuria, unspecified: Secondary | ICD-10-CM | POA: Diagnosis not present

## 2018-11-24 DIAGNOSIS — R945 Abnormal results of liver function studies: Secondary | ICD-10-CM | POA: Diagnosis not present

## 2018-11-24 DIAGNOSIS — J309 Allergic rhinitis, unspecified: Secondary | ICD-10-CM | POA: Diagnosis not present

## 2018-11-24 DIAGNOSIS — R609 Edema, unspecified: Secondary | ICD-10-CM | POA: Diagnosis not present

## 2018-11-24 DIAGNOSIS — M25551 Pain in right hip: Secondary | ICD-10-CM | POA: Diagnosis not present

## 2018-12-11 DIAGNOSIS — R945 Abnormal results of liver function studies: Secondary | ICD-10-CM | POA: Diagnosis not present

## 2018-12-11 DIAGNOSIS — J309 Allergic rhinitis, unspecified: Secondary | ICD-10-CM | POA: Diagnosis not present

## 2018-12-11 DIAGNOSIS — E663 Overweight: Secondary | ICD-10-CM | POA: Diagnosis not present

## 2018-12-11 DIAGNOSIS — N189 Chronic kidney disease, unspecified: Secondary | ICD-10-CM | POA: Diagnosis not present

## 2018-12-11 DIAGNOSIS — R609 Edema, unspecified: Secondary | ICD-10-CM | POA: Diagnosis not present

## 2018-12-11 DIAGNOSIS — I10 Essential (primary) hypertension: Secondary | ICD-10-CM | POA: Diagnosis not present

## 2018-12-11 DIAGNOSIS — M25551 Pain in right hip: Secondary | ICD-10-CM | POA: Diagnosis not present

## 2018-12-11 DIAGNOSIS — E0590 Thyrotoxicosis, unspecified without thyrotoxic crisis or storm: Secondary | ICD-10-CM | POA: Diagnosis not present

## 2018-12-11 DIAGNOSIS — E875 Hyperkalemia: Secondary | ICD-10-CM | POA: Diagnosis not present

## 2018-12-11 DIAGNOSIS — R809 Proteinuria, unspecified: Secondary | ICD-10-CM | POA: Diagnosis not present

## 2018-12-22 DIAGNOSIS — R809 Proteinuria, unspecified: Secondary | ICD-10-CM | POA: Diagnosis not present

## 2018-12-22 DIAGNOSIS — N189 Chronic kidney disease, unspecified: Secondary | ICD-10-CM | POA: Diagnosis not present

## 2018-12-22 DIAGNOSIS — R609 Edema, unspecified: Secondary | ICD-10-CM | POA: Diagnosis not present

## 2018-12-22 DIAGNOSIS — E119 Type 2 diabetes mellitus without complications: Secondary | ICD-10-CM | POA: Diagnosis not present

## 2018-12-22 DIAGNOSIS — M25551 Pain in right hip: Secondary | ICD-10-CM | POA: Diagnosis not present

## 2018-12-22 DIAGNOSIS — I10 Essential (primary) hypertension: Secondary | ICD-10-CM | POA: Diagnosis not present

## 2018-12-22 DIAGNOSIS — E875 Hyperkalemia: Secondary | ICD-10-CM | POA: Diagnosis not present

## 2018-12-22 DIAGNOSIS — R7989 Other specified abnormal findings of blood chemistry: Secondary | ICD-10-CM | POA: Diagnosis not present

## 2018-12-22 DIAGNOSIS — E0590 Thyrotoxicosis, unspecified without thyrotoxic crisis or storm: Secondary | ICD-10-CM | POA: Diagnosis not present

## 2018-12-22 DIAGNOSIS — J309 Allergic rhinitis, unspecified: Secondary | ICD-10-CM | POA: Diagnosis not present

## 2019-01-15 DIAGNOSIS — I10 Essential (primary) hypertension: Secondary | ICD-10-CM | POA: Diagnosis not present

## 2019-01-15 DIAGNOSIS — R7989 Other specified abnormal findings of blood chemistry: Secondary | ICD-10-CM | POA: Diagnosis not present

## 2019-01-15 DIAGNOSIS — R609 Edema, unspecified: Secondary | ICD-10-CM | POA: Diagnosis not present

## 2019-01-15 DIAGNOSIS — N189 Chronic kidney disease, unspecified: Secondary | ICD-10-CM | POA: Diagnosis not present

## 2019-01-15 DIAGNOSIS — E0590 Thyrotoxicosis, unspecified without thyrotoxic crisis or storm: Secondary | ICD-10-CM | POA: Diagnosis not present

## 2019-01-15 DIAGNOSIS — M25561 Pain in right knee: Secondary | ICD-10-CM | POA: Diagnosis not present

## 2019-01-15 DIAGNOSIS — R809 Proteinuria, unspecified: Secondary | ICD-10-CM | POA: Diagnosis not present

## 2019-01-15 DIAGNOSIS — E119 Type 2 diabetes mellitus without complications: Secondary | ICD-10-CM | POA: Diagnosis not present

## 2019-01-15 DIAGNOSIS — J309 Allergic rhinitis, unspecified: Secondary | ICD-10-CM | POA: Diagnosis not present

## 2019-01-15 DIAGNOSIS — D509 Iron deficiency anemia, unspecified: Secondary | ICD-10-CM | POA: Diagnosis not present

## 2019-01-19 DIAGNOSIS — E7800 Pure hypercholesterolemia, unspecified: Secondary | ICD-10-CM | POA: Diagnosis not present

## 2019-01-19 DIAGNOSIS — E119 Type 2 diabetes mellitus without complications: Secondary | ICD-10-CM | POA: Diagnosis not present

## 2019-01-19 DIAGNOSIS — Z8679 Personal history of other diseases of the circulatory system: Secondary | ICD-10-CM | POA: Diagnosis not present

## 2019-01-19 DIAGNOSIS — M25561 Pain in right knee: Secondary | ICD-10-CM | POA: Diagnosis not present

## 2019-01-19 DIAGNOSIS — Z79899 Other long term (current) drug therapy: Secondary | ICD-10-CM | POA: Diagnosis not present

## 2019-01-19 DIAGNOSIS — Z79891 Long term (current) use of opiate analgesic: Secondary | ICD-10-CM | POA: Diagnosis not present

## 2019-01-29 DIAGNOSIS — E7800 Pure hypercholesterolemia, unspecified: Secondary | ICD-10-CM | POA: Diagnosis not present

## 2019-01-29 DIAGNOSIS — R7989 Other specified abnormal findings of blood chemistry: Secondary | ICD-10-CM | POA: Diagnosis not present

## 2019-01-29 DIAGNOSIS — J309 Allergic rhinitis, unspecified: Secondary | ICD-10-CM | POA: Diagnosis not present

## 2019-01-29 DIAGNOSIS — D509 Iron deficiency anemia, unspecified: Secondary | ICD-10-CM | POA: Diagnosis not present

## 2019-01-29 DIAGNOSIS — E0590 Thyrotoxicosis, unspecified without thyrotoxic crisis or storm: Secondary | ICD-10-CM | POA: Diagnosis not present

## 2019-01-29 DIAGNOSIS — E875 Hyperkalemia: Secondary | ICD-10-CM | POA: Diagnosis not present

## 2019-01-29 DIAGNOSIS — R809 Proteinuria, unspecified: Secondary | ICD-10-CM | POA: Diagnosis not present

## 2019-01-29 DIAGNOSIS — J028 Acute pharyngitis due to other specified organisms: Secondary | ICD-10-CM | POA: Diagnosis not present

## 2019-01-29 DIAGNOSIS — N189 Chronic kidney disease, unspecified: Secondary | ICD-10-CM | POA: Diagnosis not present

## 2019-01-29 DIAGNOSIS — R609 Edema, unspecified: Secondary | ICD-10-CM | POA: Diagnosis not present

## 2019-02-12 DIAGNOSIS — R7989 Other specified abnormal findings of blood chemistry: Secondary | ICD-10-CM | POA: Diagnosis not present

## 2019-02-12 DIAGNOSIS — R809 Proteinuria, unspecified: Secondary | ICD-10-CM | POA: Diagnosis not present

## 2019-02-12 DIAGNOSIS — E0590 Thyrotoxicosis, unspecified without thyrotoxic crisis or storm: Secondary | ICD-10-CM | POA: Diagnosis not present

## 2019-02-12 DIAGNOSIS — J309 Allergic rhinitis, unspecified: Secondary | ICD-10-CM | POA: Diagnosis not present

## 2019-02-12 DIAGNOSIS — N189 Chronic kidney disease, unspecified: Secondary | ICD-10-CM | POA: Diagnosis not present

## 2019-02-12 DIAGNOSIS — D509 Iron deficiency anemia, unspecified: Secondary | ICD-10-CM | POA: Diagnosis not present

## 2019-02-12 DIAGNOSIS — E7800 Pure hypercholesterolemia, unspecified: Secondary | ICD-10-CM | POA: Diagnosis not present

## 2019-02-12 DIAGNOSIS — E875 Hyperkalemia: Secondary | ICD-10-CM | POA: Diagnosis not present

## 2019-02-12 DIAGNOSIS — I10 Essential (primary) hypertension: Secondary | ICD-10-CM | POA: Diagnosis not present

## 2019-02-12 DIAGNOSIS — R609 Edema, unspecified: Secondary | ICD-10-CM | POA: Diagnosis not present

## 2019-02-18 DIAGNOSIS — E0590 Thyrotoxicosis, unspecified without thyrotoxic crisis or storm: Secondary | ICD-10-CM | POA: Diagnosis not present

## 2019-02-18 DIAGNOSIS — N189 Chronic kidney disease, unspecified: Secondary | ICD-10-CM | POA: Diagnosis not present

## 2019-02-18 DIAGNOSIS — R809 Proteinuria, unspecified: Secondary | ICD-10-CM | POA: Diagnosis not present

## 2019-02-18 DIAGNOSIS — J309 Allergic rhinitis, unspecified: Secondary | ICD-10-CM | POA: Diagnosis not present

## 2019-02-18 DIAGNOSIS — M25552 Pain in left hip: Secondary | ICD-10-CM | POA: Diagnosis not present

## 2019-02-18 DIAGNOSIS — D509 Iron deficiency anemia, unspecified: Secondary | ICD-10-CM | POA: Diagnosis not present

## 2019-02-18 DIAGNOSIS — E7800 Pure hypercholesterolemia, unspecified: Secondary | ICD-10-CM | POA: Diagnosis not present

## 2019-02-18 DIAGNOSIS — R7989 Other specified abnormal findings of blood chemistry: Secondary | ICD-10-CM | POA: Diagnosis not present

## 2019-02-18 DIAGNOSIS — E875 Hyperkalemia: Secondary | ICD-10-CM | POA: Diagnosis not present

## 2019-02-18 DIAGNOSIS — R609 Edema, unspecified: Secondary | ICD-10-CM | POA: Diagnosis not present

## 2019-02-26 DIAGNOSIS — R7989 Other specified abnormal findings of blood chemistry: Secondary | ICD-10-CM | POA: Diagnosis not present

## 2019-02-26 DIAGNOSIS — R609 Edema, unspecified: Secondary | ICD-10-CM | POA: Diagnosis not present

## 2019-02-26 DIAGNOSIS — N189 Chronic kidney disease, unspecified: Secondary | ICD-10-CM | POA: Diagnosis not present

## 2019-02-26 DIAGNOSIS — I4892 Unspecified atrial flutter: Secondary | ICD-10-CM | POA: Diagnosis not present

## 2019-02-26 DIAGNOSIS — E0590 Thyrotoxicosis, unspecified without thyrotoxic crisis or storm: Secondary | ICD-10-CM | POA: Diagnosis not present

## 2019-02-26 DIAGNOSIS — E875 Hyperkalemia: Secondary | ICD-10-CM | POA: Diagnosis not present

## 2019-02-26 DIAGNOSIS — D509 Iron deficiency anemia, unspecified: Secondary | ICD-10-CM | POA: Diagnosis not present

## 2019-02-26 DIAGNOSIS — J309 Allergic rhinitis, unspecified: Secondary | ICD-10-CM | POA: Diagnosis not present

## 2019-02-26 DIAGNOSIS — R809 Proteinuria, unspecified: Secondary | ICD-10-CM | POA: Diagnosis not present

## 2019-02-26 DIAGNOSIS — E7800 Pure hypercholesterolemia, unspecified: Secondary | ICD-10-CM | POA: Diagnosis not present

## 2019-02-27 DIAGNOSIS — R609 Edema, unspecified: Secondary | ICD-10-CM | POA: Diagnosis not present

## 2019-02-27 DIAGNOSIS — D509 Iron deficiency anemia, unspecified: Secondary | ICD-10-CM | POA: Diagnosis not present

## 2019-02-27 DIAGNOSIS — R7989 Other specified abnormal findings of blood chemistry: Secondary | ICD-10-CM | POA: Diagnosis not present

## 2019-02-27 DIAGNOSIS — N189 Chronic kidney disease, unspecified: Secondary | ICD-10-CM | POA: Diagnosis not present

## 2019-02-27 DIAGNOSIS — I4892 Unspecified atrial flutter: Secondary | ICD-10-CM | POA: Diagnosis not present

## 2019-02-27 DIAGNOSIS — I10 Essential (primary) hypertension: Secondary | ICD-10-CM | POA: Diagnosis not present

## 2019-02-27 DIAGNOSIS — J309 Allergic rhinitis, unspecified: Secondary | ICD-10-CM | POA: Diagnosis not present

## 2019-02-27 DIAGNOSIS — R69 Illness, unspecified: Secondary | ICD-10-CM | POA: Diagnosis not present

## 2019-02-27 DIAGNOSIS — R809 Proteinuria, unspecified: Secondary | ICD-10-CM | POA: Diagnosis not present

## 2019-03-02 ENCOUNTER — Ambulatory Visit: Payer: Self-pay | Admitting: Cardiology

## 2019-03-02 ENCOUNTER — Encounter: Payer: Self-pay | Admitting: Cardiology

## 2019-03-02 DIAGNOSIS — F32A Depression, unspecified: Secondary | ICD-10-CM | POA: Insufficient documentation

## 2019-03-02 DIAGNOSIS — E118 Type 2 diabetes mellitus with unspecified complications: Secondary | ICD-10-CM | POA: Insufficient documentation

## 2019-03-02 DIAGNOSIS — F329 Major depressive disorder, single episode, unspecified: Secondary | ICD-10-CM | POA: Insufficient documentation

## 2019-03-02 DIAGNOSIS — M1990 Unspecified osteoarthritis, unspecified site: Secondary | ICD-10-CM

## 2019-03-02 DIAGNOSIS — E1122 Type 2 diabetes mellitus with diabetic chronic kidney disease: Secondary | ICD-10-CM

## 2019-03-02 DIAGNOSIS — I119 Hypertensive heart disease without heart failure: Secondary | ICD-10-CM | POA: Insufficient documentation

## 2019-03-02 DIAGNOSIS — N1832 Chronic kidney disease, stage 3b: Secondary | ICD-10-CM

## 2019-03-02 HISTORY — DX: Type 2 diabetes mellitus with diabetic chronic kidney disease: E11.22

## 2019-03-02 HISTORY — DX: Unspecified osteoarthritis, unspecified site: M19.90

## 2019-03-02 HISTORY — DX: Chronic kidney disease, stage 3b: N18.32

## 2019-03-02 HISTORY — DX: Hypertensive heart disease without heart failure: I11.9

## 2019-03-02 HISTORY — DX: Depression, unspecified: F32.A

## 2019-03-14 DIAGNOSIS — R7989 Other specified abnormal findings of blood chemistry: Secondary | ICD-10-CM | POA: Diagnosis not present

## 2019-03-14 DIAGNOSIS — I10 Essential (primary) hypertension: Secondary | ICD-10-CM | POA: Diagnosis not present

## 2019-03-14 DIAGNOSIS — N189 Chronic kidney disease, unspecified: Secondary | ICD-10-CM | POA: Diagnosis not present

## 2019-03-14 DIAGNOSIS — I4891 Unspecified atrial fibrillation: Secondary | ICD-10-CM | POA: Diagnosis not present

## 2019-03-14 DIAGNOSIS — R609 Edema, unspecified: Secondary | ICD-10-CM | POA: Diagnosis not present

## 2019-03-14 DIAGNOSIS — R69 Illness, unspecified: Secondary | ICD-10-CM | POA: Diagnosis not present

## 2019-03-14 DIAGNOSIS — M159 Polyosteoarthritis, unspecified: Secondary | ICD-10-CM | POA: Diagnosis not present

## 2019-03-14 DIAGNOSIS — E875 Hyperkalemia: Secondary | ICD-10-CM | POA: Diagnosis not present

## 2019-03-14 DIAGNOSIS — J309 Allergic rhinitis, unspecified: Secondary | ICD-10-CM | POA: Diagnosis not present

## 2019-03-14 DIAGNOSIS — R809 Proteinuria, unspecified: Secondary | ICD-10-CM | POA: Diagnosis not present

## 2019-03-26 DIAGNOSIS — I4892 Unspecified atrial flutter: Secondary | ICD-10-CM | POA: Diagnosis not present

## 2019-03-26 DIAGNOSIS — R809 Proteinuria, unspecified: Secondary | ICD-10-CM | POA: Diagnosis not present

## 2019-03-26 DIAGNOSIS — K2900 Acute gastritis without bleeding: Secondary | ICD-10-CM | POA: Diagnosis not present

## 2019-03-26 DIAGNOSIS — J309 Allergic rhinitis, unspecified: Secondary | ICD-10-CM | POA: Diagnosis not present

## 2019-03-26 DIAGNOSIS — R609 Edema, unspecified: Secondary | ICD-10-CM | POA: Diagnosis not present

## 2019-03-26 DIAGNOSIS — N390 Urinary tract infection, site not specified: Secondary | ICD-10-CM | POA: Diagnosis not present

## 2019-03-26 DIAGNOSIS — R7989 Other specified abnormal findings of blood chemistry: Secondary | ICD-10-CM | POA: Diagnosis not present

## 2019-03-26 DIAGNOSIS — E7800 Pure hypercholesterolemia, unspecified: Secondary | ICD-10-CM | POA: Diagnosis not present

## 2019-03-26 DIAGNOSIS — E875 Hyperkalemia: Secondary | ICD-10-CM | POA: Diagnosis not present

## 2019-03-26 DIAGNOSIS — I10 Essential (primary) hypertension: Secondary | ICD-10-CM | POA: Diagnosis not present

## 2019-04-09 DIAGNOSIS — R809 Proteinuria, unspecified: Secondary | ICD-10-CM | POA: Diagnosis not present

## 2019-04-09 DIAGNOSIS — M25552 Pain in left hip: Secondary | ICD-10-CM | POA: Diagnosis not present

## 2019-04-09 DIAGNOSIS — R5382 Chronic fatigue, unspecified: Secondary | ICD-10-CM | POA: Diagnosis not present

## 2019-04-09 DIAGNOSIS — I4892 Unspecified atrial flutter: Secondary | ICD-10-CM | POA: Diagnosis not present

## 2019-04-09 DIAGNOSIS — I10 Essential (primary) hypertension: Secondary | ICD-10-CM | POA: Diagnosis not present

## 2019-04-09 DIAGNOSIS — E0590 Thyrotoxicosis, unspecified without thyrotoxic crisis or storm: Secondary | ICD-10-CM | POA: Diagnosis not present

## 2019-04-09 DIAGNOSIS — J309 Allergic rhinitis, unspecified: Secondary | ICD-10-CM | POA: Diagnosis not present

## 2019-04-09 DIAGNOSIS — N189 Chronic kidney disease, unspecified: Secondary | ICD-10-CM | POA: Diagnosis not present

## 2019-04-09 DIAGNOSIS — R7989 Other specified abnormal findings of blood chemistry: Secondary | ICD-10-CM | POA: Diagnosis not present

## 2019-04-09 DIAGNOSIS — R609 Edema, unspecified: Secondary | ICD-10-CM | POA: Diagnosis not present

## 2019-04-09 DIAGNOSIS — E875 Hyperkalemia: Secondary | ICD-10-CM | POA: Diagnosis not present

## 2019-04-15 DIAGNOSIS — J309 Allergic rhinitis, unspecified: Secondary | ICD-10-CM | POA: Diagnosis not present

## 2019-04-15 DIAGNOSIS — R609 Edema, unspecified: Secondary | ICD-10-CM | POA: Diagnosis not present

## 2019-04-15 DIAGNOSIS — E663 Overweight: Secondary | ICD-10-CM | POA: Diagnosis not present

## 2019-04-15 DIAGNOSIS — I482 Chronic atrial fibrillation, unspecified: Secondary | ICD-10-CM | POA: Diagnosis not present

## 2019-04-15 DIAGNOSIS — I10 Essential (primary) hypertension: Secondary | ICD-10-CM | POA: Diagnosis not present

## 2019-04-15 DIAGNOSIS — N189 Chronic kidney disease, unspecified: Secondary | ICD-10-CM | POA: Diagnosis not present

## 2019-04-15 DIAGNOSIS — E875 Hyperkalemia: Secondary | ICD-10-CM | POA: Diagnosis not present

## 2019-04-15 DIAGNOSIS — I4892 Unspecified atrial flutter: Secondary | ICD-10-CM | POA: Diagnosis not present

## 2019-04-15 DIAGNOSIS — R809 Proteinuria, unspecified: Secondary | ICD-10-CM | POA: Diagnosis not present

## 2019-04-15 DIAGNOSIS — R7989 Other specified abnormal findings of blood chemistry: Secondary | ICD-10-CM | POA: Diagnosis not present

## 2019-04-22 NOTE — H&P (View-Only) (Signed)
Cardiology Office Note:    Date:  04/23/2019   ID:  Brianna Porter, DOB 01/04/1961, MRN [id number]  PCP:  Cher Nakai, MD  Cardiologist:  Shirlee More, MD   Referring MD: Cher Nakai, MD  ASSESSMENT:    1. Typical atrial flutter ([place])   2. Hypertensive heart disease, unspecified whether heart failure present   3. Mixed hyperlipidemia   4. Iron deficiency anemia, unspecified iron deficiency anemia type   5. CKD (chronic kidney disease) stage 3, GFR 30-59 ml/min (HCC)    PLAN:    In order of problems listed above:  1. Today her rhythm is more rapid atrial fibrillation flutter continue beta-blocker arrange for TEE guided cardioversion. 2. Relatively hypotensive on will not lay her on a calcium channel blocker for rate control increase her diuretic with decompensated heart failure 3. Continue statin 4. Recheck CBC today 5. Recheck renal function today as well as potassium increase her diuretic with renal failure and decompensated heart failure  Next appointment 2 weeks   Medication Adjustments/Labs and Tests Ordered: Current medicines are reviewed at length with the patient today.  Concerns regarding medicines are outlined above.  No orders of the defined types were placed in this encounter.  No orders of the defined types were placed in this encounter.    Chief Complaint  Patient presents with  . Atrial Flutter    History of Present Illness:    Brianna Porter is a 58 y.o. female CHADS2 vasc of 3 who is being seen today for the evaluation of atrial flutter at the request of Cher Nakai, MD.  She was a no-show for initial office visit scheduled 03/02/2019.  She has been ill since the beginning of July and had documented atrial flutter initially.  She really wanted avoid medical interactions hoped it would go away and since then has not done well she followed up with her PCP with some worsening heart failure rapid rate and schedule this appointment.  She has increasing edema  and takes alternating 40 and 60 mg of furosemide daily she is weak she has exercise intolerance short of breath even with ADLs but surprisingly is not having orthopnea or PND.  She has had no chest pain.  I reviewed with the patient and her granddaughter she is in rapid atrial fibrillation flutter decompensated heart failure and advised her to undergo cardioversion.  She has been on anticoagulant now for about 1 week and she did not take it this morning as she perceives Eliquis is causing pruritus and with her CKD I will switch her to reduced dose Xarelto to start this evening.  I reviewed the options for treatment that are very limited she is relatively hypotensive and do not think she tolerate additional beta-blocker or calcium channel blocker for rate control and digoxin is a poor choice with her CKD.  I advised her to take her anticoagulant continue her beta-blocker and should be set up for outpatient TEE cardioversion.  If she obtains sinus rhythm I will start her on amiodarone 400 milligrams twice daily if she fails to achieve cardioversion EP consultation.  I stressed the importance of uninterrupted anticoagulation from this point forward.  I do not think she needs to be admitted to the hospital as this is gone on now for about 2 months.  She will also increase the dose of her diuretic.  She did have labs in the last week and a PCP which shows a hemoglobin greater than 9 we will repeat today  Past  Medical History:  Diagnosis Date  . Anxiety and depression   . Atrial flutter ([place])   . Chronic kidney disease   . Diabetes ([place])   . Edema   . Elevated liver function tests   . Hypercholesteremia   . Hyperkalemia   . Hypertension   . Hyperthyroidism   . Iron deficiency anemia     Past Surgical History:  Procedure Laterality Date  . TUBAL LIGATION      Current Medications: Current Meds  Medication Sig  . ALPRAZolam (XANAX) 0.5 MG tablet Take 1 tablet by mouth 3 (three) times daily as needed.    . cetirizine (ZYRTEC) 10 MG tablet Take 10 mg by mouth as needed for allergies.  . DULoxetine (CYMBALTA) 60 MG capsule Take 60 mg by mouth 2 (two) times daily.  Marland Kitchen ELIQUIS 5 MG TABS tablet Take 5 mg by mouth 2 (two) times daily.  . fenofibrate (TRICOR) 48 MG tablet Take 48 mg by mouth at bedtime.  . furosemide (LASIX) 20 MG tablet Take 40-60 mg by mouth daily.   Marland Kitchen gabapentin (NEURONTIN) 800 MG tablet Take 800 mg by mouth as needed.   . metFORMIN (GLUCOPHAGE) 850 MG tablet Take 850 mg by mouth 2 (two) times daily.  . methimazole (TAPAZOLE) 5 MG tablet Take 5 mg by mouth daily.   . metoprolol succinate (TOPROL-XL) 100 MG 24 hr tablet Take 100 mg by mouth daily.   . promethazine (PHENERGAN) 25 MG tablet Take 25 mg by mouth every 6 (six) hours as needed.  . simvastatin (ZOCOR) 80 MG tablet Take 40 mg by mouth at bedtime.   . traMADol (ULTRAM) 50 MG tablet Take 1 tablet by mouth 3 (three) times daily.      Allergies:   Patient has no known allergies.   Social History   Socioeconomic History  . Marital status: Married    Spouse name: Not on file  . Number of children: Not on file  . Years of education: Not on file  . Highest education level: Not on file  Occupational History  . Not on file  Social Needs  . Financial resource strain: Not on file  . Food insecurity    Worry: Not on file    Inability: Not on file  . Transportation needs    Medical: Not on file    Non-medical: Not on file  Tobacco Use  . Smoking status: Former Smoker    Packs/day: 2.00    Years: 30.00    Pack years: 60.00    Types: Cigarettes    Quit date: 2001    Years since quitting: 19.6  . Smokeless tobacco: Never Used  Substance and Sexual Activity  . Alcohol use: Never    Frequency: Never  . Drug use: Never  . Sexual activity: Not on file  Lifestyle  . Physical activity    Days per week: Not on file    Minutes per session: Not on file  . Stress: Not on file  Relationships  . Social Product manager on phone: Not on file    Gets together: Not on file    Attends religious service: Not on file    Active member of club or organization: Not on file    Attends meetings of clubs or organizations: Not on file    Relationship status: Not on file  Other Topics Concern  . Not on file  Social History Narrative  . Not on file  Family History: The patient's family history includes Cancer in her mother; Heart disease in her mother; Hypercholesterolemia in her mother; Osteoarthritis in her mother; Stroke in her mother.  ROS:   Review of Systems  Constitution: Positive for malaise/fatigue and weight gain.  HENT: Negative.   Eyes: Negative.   Cardiovascular: Positive for dyspnea on exertion, leg swelling and palpitations.  Respiratory: Positive for shortness of breath.   Endocrine: Negative.   Hematologic/Lymphatic: Negative.   Skin: Negative.   Musculoskeletal: Positive for muscle weakness.  Gastrointestinal: Negative.   Genitourinary: Negative.   Neurological: Negative.   Psychiatric/Behavioral: Negative.   Allergic/Immunologic: Negative.    Please see the history of present illness.     All other systems reviewed and are negative.  EKGs/Labs/Other Studies Reviewed:    The following studies were reviewed today:   EKG:  EKG personally reviewed 02/26/2019 shows typical atrial flutter ventricular rate of 166 bpm nonspecific ST changes EKG today personally reviewed shows I last organized atrial fibrillation flutter rate of 160 bpm Recent Labs:  CBC: Hemoglobin 9.8 platelets 487,000 BMP creatinine 1.80 GFR 31 cc stage III CKD potassium 4.8 sodium 127 TSH 1.41, T3 5.6, T4 1.4.  All are normal Physical Exam:    VS:  Ht 5\' 3"  (1.6 m)   Wt 139 lb (63 kg)   BMI 24.62 kg/m     Wt Readings from Last 3 Encounters:  04/23/19 139 lb (63 kg)     GEN: She is chronically ill debilitated has multiple cutaneous ecchymoses she is anxious well nourished, well developed in no acute  distress HEENT: Normal NECK: No JVD; No carotid bruits LYMPHATICS: No lymphadenopathy CARDIAC: Rapid irregular variable first heart sound , no murmurs, rubs, gallops RESPIRATORY:  Clear to auscultation without rales, wheezing or rhonchi  ABDOMEN: Soft, non-tender, non-distended MUSCULOSKELETAL: 4+ bilateral pitting lower extremity edema; No deformity  SKIN: Warm and dry NEUROLOGIC:  Alert and oriented x 3 PSYCHIATRIC:  Normal affect     Signed, Shirlee More, MD  04/23/2019 11:19 AM    [place]

## 2019-04-22 NOTE — Progress Notes (Signed)
Cardiology Office Note:    Date:  04/23/2019   ID:  Brianna Porter, DOB Jul 08, 1961, MRN [id number]  PCP:  Cher Nakai, MD  Cardiologist:  Shirlee More, MD   Referring MD: Cher Nakai, MD  ASSESSMENT:    1. Typical atrial flutter ([place]rianna Porter)   2. Hypertensive heart disease, unspecified whether heart failure present   3. Mixed hyperlipidemia   4. Iron deficiency anemia, unspecified iron deficiency anemia type   5. CKD (chronic kidney disease) stage 3, GFR 30-59 ml/min (HCC)    PLAN:    In order of problems listed above:  1. Today her rhythm is more rapid atrial fibrillation flutter continue beta-blocker arrange for TEE guided cardioversion. 2. Relatively hypotensive on will not lay her on a calcium channel blocker for rate control increase her diuretic with decompensated heart failure 3. Continue statin 4. Recheck CBC today 5. Recheck renal function today as well as potassium increase her diuretic with renal failure and decompensated heart failure  Next appointment 2 weeks   Medication Adjustments/Labs and Tests Ordered: Current medicines are reviewed at length with the patient today.  Concerns regarding medicines are outlined above.  No orders of the defined types were placed in this encounter.  No orders of the defined types were placed in this encounter.    Chief Complaint  Patient presents with  . Atrial Flutter    History of Present Illness:    Brianna Porter is a 58 y.o. female CHADS2 vasc of 3 who is being seen today for the evaluation of atrial flutter at the request of Cher Nakai, MD.  She was a no-show for initial office visit scheduled 03/02/2019.  She has been ill since the beginning of July and had documented atrial flutter initially.  She really wanted avoid medical interactions hoped it would go away and since then has not done well she followed up with her PCP with some worsening heart failure rapid rate and schedule this appointment.  She has increasing edema  and takes alternating 40 and 60 mg of furosemide daily she is weak she has exercise intolerance short of breath even with ADLs but surprisingly is not having orthopnea or PND.  She has had no chest pain.  I reviewed with the patient and her granddaughter she is in rapid atrial fibrillation flutter decompensated heart failure and advised her to undergo cardioversion.  She has been on anticoagulant now for about 1 week and she did not take it this morning as she perceives Eliquis is causing pruritus and with her CKD I will switch her to reduced dose Xarelto to start this evening.  I reviewed the options for treatment that are very limited she is relatively hypotensive and do not think she tolerate additional beta-blocker or calcium channel blocker for rate control and digoxin is a poor choice with her CKD.  I advised her to take her anticoagulant continue her beta-blocker and should be set up for outpatient TEE cardioversion.  If she obtains sinus rhythm I will start her on amiodarone 400 milligrams twice daily if she fails to achieve cardioversion EP consultation.  I stressed the importance of uninterrupted anticoagulation from this point forward.  I do not think she needs to be admitted to the hospital as this is gone on now for about 2 months.  She will also increase the dose of her diuretic.  She did have labs in the last week and a PCP which shows a hemoglobin greater than 9 we will repeat today  Past  Medical History:  Diagnosis Date  . Anxiety and depression   . Atrial flutter ([place])   . Chronic kidney disease   . Diabetes ([place])   . Edema   . Elevated liver function tests   . Hypercholesteremia   . Hyperkalemia   . Hypertension   . Hyperthyroidism   . Iron deficiency anemia     Past Surgical History:  Procedure Laterality Date  . TUBAL LIGATION      Current Medications: Current Meds  Medication Sig  . ALPRAZolam (XANAX) 0.5 MG tablet Take 1 tablet by mouth 3 (three) times daily as needed.    . cetirizine (ZYRTEC) 10 MG tablet Take 10 mg by mouth as needed for allergies.  . DULoxetine (CYMBALTA) 60 MG capsule Take 60 mg by mouth 2 (two) times daily.  Marland Kitchen ELIQUIS 5 MG TABS tablet Take 5 mg by mouth 2 (two) times daily.  . fenofibrate (TRICOR) 48 MG tablet Take 48 mg by mouth at bedtime.  . furosemide (LASIX) 20 MG tablet Take 40-60 mg by mouth daily.   Marland Kitchen gabapentin (NEURONTIN) 800 MG tablet Take 800 mg by mouth as needed.   . metFORMIN (GLUCOPHAGE) 850 MG tablet Take 850 mg by mouth 2 (two) times daily.  . methimazole (TAPAZOLE) 5 MG tablet Take 5 mg by mouth daily.   . metoprolol succinate (TOPROL-XL) 100 MG 24 hr tablet Take 100 mg by mouth daily.   . promethazine (PHENERGAN) 25 MG tablet Take 25 mg by mouth every 6 (six) hours as needed.  . simvastatin (ZOCOR) 80 MG tablet Take 40 mg by mouth at bedtime.   . traMADol (ULTRAM) 50 MG tablet Take 1 tablet by mouth 3 (three) times daily.      Allergies:   Patient has no known allergies.   Social History   Socioeconomic History  . Marital status: Married    Spouse name: Not on file  . Number of children: Not on file  . Years of education: Not on file  . Highest education level: Not on file  Occupational History  . Not on file  Social Needs  . Financial resource strain: Not on file  . Food insecurity    Worry: Not on file    Inability: Not on file  . Transportation needs    Medical: Not on file    Non-medical: Not on file  Tobacco Use  . Smoking status: Former Smoker    Packs/day: 2.00    Years: 30.00    Pack years: 60.00    Types: Cigarettes    Quit date: 2001    Years since quitting: 19.6  . Smokeless tobacco: Never Used  Substance and Sexual Activity  . Alcohol use: Never    Frequency: Never  . Drug use: Never  . Sexual activity: Not on file  Lifestyle  . Physical activity    Days per week: Not on file    Minutes per session: Not on file  . Stress: Not on file  Relationships  . Social Product manager on phone: Not on file    Gets together: Not on file    Attends religious service: Not on file    Active member of club or organization: Not on file    Attends meetings of clubs or organizations: Not on file    Relationship status: Not on file  Other Topics Concern  . Not on file  Social History Narrative  . Not on file  Family History: The patient's family history includes Cancer in her mother; Heart disease in her mother; Hypercholesterolemia in her mother; Osteoarthritis in her mother; Stroke in her mother.  ROS:   Review of Systems  Constitution: Positive for malaise/fatigue and weight gain.  HENT: Negative.   Eyes: Negative.   Cardiovascular: Positive for dyspnea on exertion, leg swelling and palpitations.  Respiratory: Positive for shortness of breath.   Endocrine: Negative.   Hematologic/Lymphatic: Negative.   Skin: Negative.   Musculoskeletal: Positive for muscle weakness.  Gastrointestinal: Negative.   Genitourinary: Negative.   Neurological: Negative.   Psychiatric/Behavioral: Negative.   Allergic/Immunologic: Negative.    Please see the history of present illness.     All other systems reviewed and are negative.  EKGs/Labs/Other Studies Reviewed:    The following studies were reviewed today:   EKG:  EKG personally reviewed 02/26/2019 shows typical atrial flutter ventricular rate of 166 bpm nonspecific ST changes EKG today personally reviewed shows I last organized atrial fibrillation flutter rate of 160 bpm Recent Labs:  CBC: Hemoglobin 9.8 platelets 487,000 BMP creatinine 1.80 GFR 31 cc stage III CKD potassium 4.8 sodium 127 TSH 1.41, T3 5.6, T4 1.4.  All are normal Physical Exam:    VS:  Ht 5\' 3"  (1.6 m)   Wt 139 lb (63 kg)   BMI 24.62 kg/m     Wt Readings from Last 3 Encounters:  04/23/19 139 lb (63 kg)     GEN: She is chronically ill debilitated has multiple cutaneous ecchymoses she is anxious well nourished, well developed in no acute  distress HEENT: Normal NECK: No JVD; No carotid bruits LYMPHATICS: No lymphadenopathy CARDIAC: Rapid irregular variable first heart sound , no murmurs, rubs, gallops RESPIRATORY:  Clear to auscultation without rales, wheezing or rhonchi  ABDOMEN: Soft, non-tender, non-distended MUSCULOSKELETAL: 4+ bilateral pitting lower extremity edema; No deformity  SKIN: Warm and dry NEUROLOGIC:  Alert and oriented x 3 PSYCHIATRIC:  Normal affect     Signed, Shirlee More, MD  04/23/2019 11:19 AM    [place]

## 2019-04-23 ENCOUNTER — Encounter: Payer: Self-pay | Admitting: Cardiology

## 2019-04-23 ENCOUNTER — Ambulatory Visit (INDEPENDENT_AMBULATORY_CARE_PROVIDER_SITE_OTHER): Payer: Medicare HMO | Admitting: Cardiology

## 2019-04-23 ENCOUNTER — Other Ambulatory Visit: Payer: Self-pay

## 2019-04-23 VITALS — BP 100/72 | HR 161 | Ht 63.0 in | Wt 139.0 lb

## 2019-04-23 DIAGNOSIS — E785 Hyperlipidemia, unspecified: Secondary | ICD-10-CM | POA: Insufficient documentation

## 2019-04-23 DIAGNOSIS — I119 Hypertensive heart disease without heart failure: Secondary | ICD-10-CM | POA: Diagnosis not present

## 2019-04-23 DIAGNOSIS — M7989 Other specified soft tissue disorders: Secondary | ICD-10-CM | POA: Diagnosis not present

## 2019-04-23 DIAGNOSIS — E782 Mixed hyperlipidemia: Secondary | ICD-10-CM

## 2019-04-23 DIAGNOSIS — D649 Anemia, unspecified: Secondary | ICD-10-CM | POA: Insufficient documentation

## 2019-04-23 DIAGNOSIS — D509 Iron deficiency anemia, unspecified: Secondary | ICD-10-CM

## 2019-04-23 DIAGNOSIS — Z7901 Long term (current) use of anticoagulants: Secondary | ICD-10-CM | POA: Diagnosis not present

## 2019-04-23 DIAGNOSIS — N183 Chronic kidney disease, stage 3 unspecified: Secondary | ICD-10-CM

## 2019-04-23 DIAGNOSIS — Z01812 Encounter for preprocedural laboratory examination: Secondary | ICD-10-CM

## 2019-04-23 DIAGNOSIS — I483 Typical atrial flutter: Secondary | ICD-10-CM

## 2019-04-23 DIAGNOSIS — N1832 Chronic kidney disease, stage 3b: Secondary | ICD-10-CM | POA: Insufficient documentation

## 2019-04-23 DIAGNOSIS — R0602 Shortness of breath: Secondary | ICD-10-CM

## 2019-04-23 HISTORY — DX: Chronic kidney disease, stage 3b: N18.32

## 2019-04-23 HISTORY — DX: Chronic kidney disease, stage 3 unspecified: N18.30

## 2019-04-23 HISTORY — DX: Hyperlipidemia, unspecified: E78.5

## 2019-04-23 HISTORY — DX: Anemia, unspecified: D64.9

## 2019-04-23 MED ORDER — FUROSEMIDE 40 MG PO TABS: 40.0000 mg | ORAL_TABLET | Freq: Two times a day (BID) | ORAL | 3 refills | Status: DC

## 2019-04-23 MED ORDER — RIVAROXABAN 15 MG PO TABS: 15.0000 mg | ORAL_TABLET | Freq: Every day | ORAL | 3 refills | Status: DC

## 2019-04-23 NOTE — Patient Instructions (Addendum)
Medication Instructions:  Your physician has recommended you make the following change in your medication:   STOP apixaban (eliquis)  INCREASE furosemide (lasix) 40 mg: Take 1 tablet twice daily  START rivaroxaban (xarelto) 15 mg: Take 1 tablet daily   If you need a refill on your cardiac medications before your next appointment, please call your pharmacy.   Lab work: Your physician recommends that you return for lab work today: CBC, BMP, ProBNP.  If you have labs (blood work) drawn today and your tests are completely normal, you will receive your results only by: Marland Kitchen MyChart Message (if you have MyChart) OR . A paper copy in the mail If you have any lab test that is abnormal or we need to change your treatment, we will call you to review the results.  Testing/Procedures: You had an EKG today.   You will go to the [facility] on Saturday, 04/25/2019, at 11:45 am for pre-procedure drive-thru COVID testing. The address is [place], [place]. Please arrive 15 minutes early. Go to the building overhang, do not go to the tent or the tent line.   You are scheduled for a TEE/Cardioversion/TEE Cardioversion on Wednesday, 04/29/2019 with Dr. Sallyanne Kuster.  Please arrive at the [facility] (Main Entrance A) at [facility]: [address] [place], [place] [address] at 12:00 pm.   DIET: Nothing to eat or drink after midnight except a sip of water with medications (see medication instructions below)  Medication Instructions:  Hold metformin and furosemide (lasix) the day of your procedure until after it is completed.  Continue your anticoagulant: xarelto You will need to continue your anticoagulant after your procedure until you are told by your Provider that it is safe to stop.   Labs:  None needed.   You must have a responsible person to drive you home and stay in the waiting area during your procedure. Failure to do so could result in cancellation.  Bring your  insurance cards.  *Special Note: Every effort is made to have your procedure done on time. Occasionally there are emergencies that occur at the hospital that may cause delays. Please be patient if a delay does occur.    Follow-Up: At [facility], you and your health needs are our priority.  As part of our continuing mission to provide you with exceptional heart care, we have created designated Provider Care Teams.  These Care Teams include your primary Cardiologist (physician) and Advanced Practice Providers (APPs -  Physician Assistants and Nurse Practitioners) who all work together to provide you with the care you need, when you need it. You will need a follow up appointment in 2 weeks with Laurann Montana, NP.     Rivaroxaban oral tablets What is this medicine? RIVAROXABAN (ri va ROX a ban) is an anticoagulant (blood thinner). It is used to treat blood clots in the lungs or in the veins. It is also used to prevent blood clots in the lungs or in the veins. It is also used to lower the chance of stroke in people with a medical condition called atrial fibrillation. This medicine may be used for other purposes; ask your health care provider or pharmacist if you have questions. COMMON BRAND NAME(S): Xarelto, Xarelto Starter Pack What should I tell my health care provider before I take this medicine? They need to know if you have any of these conditions:  antiphospholipid antibody syndrome  artificial heart valve  bleeding disorders  bleeding in the  brain  blood in your stools (black or tarry stools) or if you have blood in your vomit  history of blood clots  history of stomach bleeding  kidney disease  liver disease  low blood counts, like low white cell, platelet, or red cell counts  recent or planned spinal or epidural procedure  take medicines that treat or prevent blood clots  an unusual or allergic reaction to rivaroxaban, other medicines, foods, dyes, or  preservatives  pregnant or trying to get pregnant  breast-feeding How should I use this medicine? Take this medicine by mouth with a glass of water. Follow the directions on the prescription label. Take your medicine at regular intervals. Do not take it more often than directed. Do not stop taking except on your doctor's advice. Stopping this medicine may increase your risk of a blood clot. Be sure to refill your prescription before you run out of medicine. If you are taking this medicine after hip or knee replacement surgery, take it with or without food. If you are taking this medicine for atrial fibrillation, take it with your evening meal. If you are taking this medicine to treat blood clots, take it with food at the same time each day. If you are unable to swallow your tablet, you may crush the tablet and mix it in applesauce. Then, immediately eat the applesauce. You should eat more food right after you eat the applesauce containing the crushed tablet. Talk to your pediatrician regarding the use of this medicine in children. Special care may be needed. Overdosage: If you think you have taken too much of this medicine contact a poison control center or emergency room at once. NOTE: This medicine is only for you. Do not share this medicine with others. What if I miss a dose? If you take your medicine once a day and miss a dose, take the missed dose as soon as you remember. If it is almost time for your next dose, take only that dose. Do not take double or extra doses. If you take your medicine twice a day and miss a dose, take the missed dose immediately. In this instance, 2 tablets may be taken at the same time. The next day you should take 1 tablet twice a day as directed. What may interact with this medicine? Do not take this medicine with any of the following medications:  defibrotide This medicine may also interact with the following medications:  aspirin and aspirin-like  medicines  certain antibiotics like erythromycin, azithromycin, and clarithromycin  certain medicines for fungal infections like ketoconazole and itraconazole  certain medicines for irregular heart beat like amiodarone, quinidine, dronedarone  certain medicines for seizures like carbamazepine, phenytoin  certain medicines that treat or prevent blood clots like warfarin, enoxaparin, and dalteparin  conivaptan  felodipine  indinavir  lopinavir; ritonavir  NSAIDS, medicines for pain and inflammation, like ibuprofen or naproxen  ranolazine  rifampin  ritonavir  SNRIs, medicines for depression, like desvenlafaxine, duloxetine, levomilnacipran, venlafaxine  SSRIs, medicines for depression, like citalopram, escitalopram, fluoxetine, fluvoxamine, paroxetine, sertraline  St. John's wort  verapamil This list may not describe all possible interactions. Give your health care provider a list of all the medicines, herbs, non-prescription drugs, or dietary supplements you use. Also tell them if you smoke, drink alcohol, or use illegal drugs. Some items may interact with your medicine. What should I watch for while using this medicine? Visit your healthcare professional for regular checks on your progress. You may need blood work done  while you are taking this medicine. Your condition will be monitored carefully while you are receiving this medicine. It is important not to miss any appointments. Avoid sports and activities that might cause injury while you are using this medicine. Severe falls or injuries can cause unseen bleeding. Be careful when using sharp tools or knives. Consider using an Copy. Take special care brushing or flossing your teeth. Report any injuries, bruising, or red spots on the skin to your healthcare professional. If you are going to need surgery or other procedure, tell your healthcare professional that you are taking this medicine. Wear a medical ID bracelet  or chain. Carry a card that describes your disease and details of your medicine and dosage times. What side effects may I notice from receiving this medicine? Side effects that you should report to your doctor or health care professional as soon as possible:  allergic reactions like skin rash, itching or hives, swelling of the face, lips, or tongue  back pain  redness, blistering, peeling or loosening of the skin, including inside the mouth  signs and symptoms of bleeding such as bloody or black, tarry stools; red or dark-brown urine; spitting up blood or brown material that looks like coffee grounds; red spots on the skin; unusual bruising or bleeding from the eye, gums, or nose  signs and symptoms of a blood clot such as chest pain; shortness of breath; pain, swelling, or warmth in the leg  signs and symptoms of a stroke such as changes in vision; confusion; trouble speaking or understanding; severe headaches; sudden numbness or weakness of the face, arm or leg; trouble walking; dizziness; loss of coordination Side effects that usually do not require medical attention (report to your doctor or health care professional if they continue or are bothersome):  dizziness  muscle pain This list may not describe all possible side effects. Call your doctor for medical advice about side effects. You may report side effects to FDA at 1-800-FDA-1088. Where should I keep my medicine? Keep out of the reach of children. Store at room temperature between 15 and 30 degrees C (59 and 86 degrees F). Throw away any unused medicine after the expiration date. NOTE: This sheet is a summary. It may not cover all possible information. If you have questions about this medicine, talk to your doctor, pharmacist, or health care provider.  2020 Elsevier/Gold Standard (2018-11-03 09:45:59)

## 2019-04-24 ENCOUNTER — Telehealth: Payer: Self-pay

## 2019-04-24 DIAGNOSIS — N183 Chronic kidney disease, stage 3 unspecified: Secondary | ICD-10-CM

## 2019-04-24 DIAGNOSIS — E876 Hypokalemia: Secondary | ICD-10-CM

## 2019-04-24 DIAGNOSIS — I119 Hypertensive heart disease without heart failure: Secondary | ICD-10-CM

## 2019-04-24 LAB — BASIC METABOLIC PANEL
BUN/Creatinine Ratio: 10 (ref 9–23)
BUN: 13 mg/dL (ref 6–24)
CO2: 29 mmol/L (ref 20–29)
Calcium: 9.4 mg/dL (ref 8.7–10.2)
Chloride: 88 mmol/L — ABNORMAL LOW (ref 96–106)
Creatinine, Ser: 1.33 mg/dL — ABNORMAL HIGH (ref 0.57–1.00)
GFR calc Af Amer: 51 mL/min/{1.73_m2} — ABNORMAL LOW (ref >59)
GFR calc non Af Amer: 44 mL/min/{1.73_m2} — ABNORMAL LOW (ref >59)
Glucose: 102 mg/dL — ABNORMAL HIGH (ref 65–99)
Potassium: 3.3 mmol/L — ABNORMAL LOW (ref 3.5–5.2)
Sodium: 133 mmol/L — ABNORMAL LOW (ref 134–144)

## 2019-04-24 LAB — CBC
Hematocrit: 34.8 % (ref 34.0–46.6)
Hemoglobin: 10.2 g/dL — ABNORMAL LOW (ref 11.1–15.9)
MCH: 24.5 pg — ABNORMAL LOW (ref 26.6–33.0)
MCHC: 29.3 g/dL — ABNORMAL LOW (ref 31.5–35.7)
MCV: 84 fL (ref 79–97)
Platelets: 533 10*3/uL — ABNORMAL HIGH (ref 150–450)
RBC: 4.16 x10E6/uL (ref 3.77–5.28)
RDW: 16.5 % — ABNORMAL HIGH (ref 11.7–15.4)
WBC: 9.6 10*3/uL (ref 3.4–10.8)

## 2019-04-24 LAB — PRO B NATRIURETIC PEPTIDE: NT-Pro BNP: 3353 pg/mL — ABNORMAL HIGH (ref 0–287)

## 2019-04-24 MED ORDER — POTASSIUM CHLORIDE CRYS ER 20 MEQ PO TBCR: 20.0000 meq | EXTENDED_RELEASE_TABLET | Freq: Two times a day (BID) | ORAL | 0 refills | Status: DC

## 2019-04-24 NOTE — Telephone Encounter (Signed)
Patient informed of results.  Rx for potassium 32meq one tablet twice daily sent to pharmacy.  Patient will recheck BMP in 1 week.

## 2019-04-25 ENCOUNTER — Other Ambulatory Visit (HOSPITAL_COMMUNITY)
Admission: RE | Admit: 2019-04-25 | Discharge: 2019-04-25 | Disposition: A | Discharge status: Home / Self Care | Payer: Medicare HMO | Source: Ambulatory Visit | Attending: Cardiovascular Disease | Admitting: Cardiovascular Disease

## 2019-04-25 DIAGNOSIS — Z20828 Contact with and (suspected) exposure to other viral communicable diseases: Secondary | ICD-10-CM | POA: Insufficient documentation

## 2019-04-25 DIAGNOSIS — Z01812 Encounter for preprocedural laboratory examination: Secondary | ICD-10-CM | POA: Insufficient documentation

## 2019-04-26 LAB — NOVEL CORONAVIRUS, NAA (HOSP ORDER, SEND-OUT TO REF LAB; TAT 18-24 HRS): SARS-CoV-2, NAA: NOT DETECTED

## 2019-04-28 DIAGNOSIS — E0590 Thyrotoxicosis, unspecified without thyrotoxic crisis or storm: Secondary | ICD-10-CM | POA: Diagnosis not present

## 2019-04-28 DIAGNOSIS — J309 Allergic rhinitis, unspecified: Secondary | ICD-10-CM | POA: Diagnosis not present

## 2019-04-28 DIAGNOSIS — D509 Iron deficiency anemia, unspecified: Secondary | ICD-10-CM | POA: Diagnosis not present

## 2019-04-28 DIAGNOSIS — I10 Essential (primary) hypertension: Secondary | ICD-10-CM | POA: Diagnosis not present

## 2019-04-28 DIAGNOSIS — E7800 Pure hypercholesterolemia, unspecified: Secondary | ICD-10-CM | POA: Diagnosis not present

## 2019-04-28 DIAGNOSIS — R809 Proteinuria, unspecified: Secondary | ICD-10-CM | POA: Diagnosis not present

## 2019-04-28 DIAGNOSIS — E119 Type 2 diabetes mellitus without complications: Secondary | ICD-10-CM | POA: Diagnosis not present

## 2019-04-28 DIAGNOSIS — I4892 Unspecified atrial flutter: Secondary | ICD-10-CM | POA: Diagnosis not present

## 2019-04-28 DIAGNOSIS — R7989 Other specified abnormal findings of blood chemistry: Secondary | ICD-10-CM | POA: Diagnosis not present

## 2019-04-28 DIAGNOSIS — R609 Edema, unspecified: Secondary | ICD-10-CM | POA: Diagnosis not present

## 2019-04-29 ENCOUNTER — Ambulatory Visit (HOSPITAL_COMMUNITY): Payer: Medicare HMO | Admitting: Anesthesiology

## 2019-04-29 ENCOUNTER — Encounter (HOSPITAL_COMMUNITY)
Admission: AD | Disposition: A | Discharge status: Home / Self Care | Payer: Self-pay | Source: Home / Self Care | Attending: Cardiovascular Disease

## 2019-04-29 ENCOUNTER — Encounter (HOSPITAL_COMMUNITY): Payer: Self-pay

## 2019-04-29 ENCOUNTER — Inpatient Hospital Stay (HOSPITAL_COMMUNITY)
Admission: AD | Admit: 2019-04-29 | Discharge: 2019-05-07 | DRG: 308 | Disposition: A | Discharge status: Home / Self Care | Payer: Medicare HMO | Attending: Cardiovascular Disease | Admitting: Cardiovascular Disease

## 2019-04-29 ENCOUNTER — Ambulatory Visit (HOSPITAL_COMMUNITY): Payer: Medicare HMO

## 2019-04-29 ENCOUNTER — Other Ambulatory Visit: Payer: Self-pay

## 2019-04-29 ENCOUNTER — Other Ambulatory Visit (HOSPITAL_COMMUNITY): Payer: Self-pay | Admitting: Cardiovascular Disease

## 2019-04-29 DIAGNOSIS — I4891 Unspecified atrial fibrillation: Secondary | ICD-10-CM | POA: Diagnosis not present

## 2019-04-29 DIAGNOSIS — I959 Hypotension, unspecified: Secondary | ICD-10-CM | POA: Diagnosis present

## 2019-04-29 DIAGNOSIS — N183 Chronic kidney disease, stage 3 (moderate): Secondary | ICD-10-CM | POA: Diagnosis present

## 2019-04-29 DIAGNOSIS — Z20828 Contact with and (suspected) exposure to other viral communicable diseases: Secondary | ICD-10-CM | POA: Diagnosis not present

## 2019-04-29 DIAGNOSIS — E871 Hypo-osmolality and hyponatremia: Secondary | ICD-10-CM | POA: Diagnosis not present

## 2019-04-29 DIAGNOSIS — I509 Heart failure, unspecified: Secondary | ICD-10-CM

## 2019-04-29 DIAGNOSIS — I10 Essential (primary) hypertension: Secondary | ICD-10-CM

## 2019-04-29 DIAGNOSIS — I4892 Unspecified atrial flutter: Secondary | ICD-10-CM

## 2019-04-29 DIAGNOSIS — Z79899 Other long term (current) drug therapy: Secondary | ICD-10-CM | POA: Diagnosis not present

## 2019-04-29 DIAGNOSIS — I1310 Hypertensive heart and chronic kidney disease without heart failure, with stage 1 through stage 4 chronic kidney disease, or unspecified chronic kidney disease: Secondary | ICD-10-CM | POA: Diagnosis not present

## 2019-04-29 DIAGNOSIS — N189 Chronic kidney disease, unspecified: Secondary | ICD-10-CM | POA: Diagnosis not present

## 2019-04-29 DIAGNOSIS — I472 Ventricular tachycardia: Secondary | ICD-10-CM | POA: Diagnosis not present

## 2019-04-29 DIAGNOSIS — Z7984 Long term (current) use of oral hypoglycemic drugs: Secondary | ICD-10-CM | POA: Diagnosis not present

## 2019-04-29 DIAGNOSIS — Z8249 Family history of ischemic heart disease and other diseases of the circulatory system: Secondary | ICD-10-CM | POA: Diagnosis not present

## 2019-04-29 DIAGNOSIS — G9341 Metabolic encephalopathy: Secondary | ICD-10-CM | POA: Diagnosis not present

## 2019-04-29 DIAGNOSIS — E782 Mixed hyperlipidemia: Secondary | ICD-10-CM | POA: Diagnosis present

## 2019-04-29 DIAGNOSIS — N179 Acute kidney failure, unspecified: Secondary | ICD-10-CM | POA: Diagnosis not present

## 2019-04-29 DIAGNOSIS — N1832 Chronic kidney disease, stage 3b: Secondary | ICD-10-CM | POA: Diagnosis present

## 2019-04-29 DIAGNOSIS — E1122 Type 2 diabetes mellitus with diabetic chronic kidney disease: Secondary | ICD-10-CM | POA: Diagnosis not present

## 2019-04-29 DIAGNOSIS — I2583 Coronary atherosclerosis due to lipid rich plaque: Secondary | ICD-10-CM | POA: Diagnosis not present

## 2019-04-29 DIAGNOSIS — F419 Anxiety disorder, unspecified: Secondary | ICD-10-CM | POA: Diagnosis present

## 2019-04-29 DIAGNOSIS — I429 Cardiomyopathy, unspecified: Secondary | ICD-10-CM | POA: Diagnosis present

## 2019-04-29 DIAGNOSIS — I483 Typical atrial flutter: Secondary | ICD-10-CM | POA: Diagnosis present

## 2019-04-29 DIAGNOSIS — E876 Hypokalemia: Secondary | ICD-10-CM | POA: Diagnosis not present

## 2019-04-29 DIAGNOSIS — Z7901 Long term (current) use of anticoagulants: Secondary | ICD-10-CM | POA: Diagnosis not present

## 2019-04-29 DIAGNOSIS — I340 Nonrheumatic mitral (valve) insufficiency: Secondary | ICD-10-CM

## 2019-04-29 DIAGNOSIS — F329 Major depressive disorder, single episode, unspecified: Secondary | ICD-10-CM | POA: Diagnosis present

## 2019-04-29 DIAGNOSIS — R410 Disorientation, unspecified: Secondary | ICD-10-CM | POA: Diagnosis not present

## 2019-04-29 DIAGNOSIS — I2510 Atherosclerotic heart disease of native coronary artery without angina pectoris: Secondary | ICD-10-CM | POA: Diagnosis not present

## 2019-04-29 DIAGNOSIS — Z87891 Personal history of nicotine dependence: Secondary | ICD-10-CM

## 2019-04-29 DIAGNOSIS — S0990XA Unspecified injury of head, initial encounter: Secondary | ICD-10-CM | POA: Diagnosis not present

## 2019-04-29 DIAGNOSIS — E785 Hyperlipidemia, unspecified: Secondary | ICD-10-CM | POA: Diagnosis not present

## 2019-04-29 DIAGNOSIS — E1110 Type 2 diabetes mellitus with ketoacidosis without coma: Secondary | ICD-10-CM | POA: Diagnosis not present

## 2019-04-29 DIAGNOSIS — R9431 Abnormal electrocardiogram [ECG] [EKG]: Secondary | ICD-10-CM | POA: Diagnosis present

## 2019-04-29 DIAGNOSIS — I5022 Chronic systolic (congestive) heart failure: Secondary | ICD-10-CM | POA: Diagnosis not present

## 2019-04-29 DIAGNOSIS — D509 Iron deficiency anemia, unspecified: Secondary | ICD-10-CM | POA: Diagnosis present

## 2019-04-29 DIAGNOSIS — R4182 Altered mental status, unspecified: Secondary | ICD-10-CM

## 2019-04-29 DIAGNOSIS — I5023 Acute on chronic systolic (congestive) heart failure: Secondary | ICD-10-CM | POA: Diagnosis not present

## 2019-04-29 DIAGNOSIS — I130 Hypertensive heart and chronic kidney disease with heart failure and stage 1 through stage 4 chronic kidney disease, or unspecified chronic kidney disease: Secondary | ICD-10-CM | POA: Diagnosis present

## 2019-04-29 DIAGNOSIS — I5021 Acute systolic (congestive) heart failure: Secondary | ICD-10-CM | POA: Diagnosis present

## 2019-04-29 DIAGNOSIS — M25519 Pain in unspecified shoulder: Secondary | ICD-10-CM

## 2019-04-29 DIAGNOSIS — I4819 Other persistent atrial fibrillation: Principal | ICD-10-CM | POA: Diagnosis present

## 2019-04-29 DIAGNOSIS — E0590 Thyrotoxicosis, unspecified without thyrotoxic crisis or storm: Secondary | ICD-10-CM | POA: Diagnosis present

## 2019-04-29 DIAGNOSIS — M25512 Pain in left shoulder: Secondary | ICD-10-CM | POA: Diagnosis present

## 2019-04-29 HISTORY — PX: CARDIOVERSION: SHX1299

## 2019-04-29 HISTORY — DX: Hypomagnesemia: E83.42

## 2019-04-29 HISTORY — DX: Hypokalemia: E87.6

## 2019-04-29 HISTORY — DX: Unspecified atrial fibrillation: I48.91

## 2019-04-29 HISTORY — DX: Atherosclerotic heart disease of native coronary artery without angina pectoris: I25.10

## 2019-04-29 HISTORY — DX: Other persistent atrial fibrillation: I48.19

## 2019-04-29 HISTORY — DX: Abnormal electrocardiogram (ECG) (EKG): R94.31

## 2019-04-29 HISTORY — DX: Hypo-osmolality and hyponatremia: E87.1

## 2019-04-29 HISTORY — DX: Other cardiomyopathies: I42.8

## 2019-04-29 HISTORY — PX: TEE WITHOUT CARDIOVERSION: SHX5443

## 2019-04-29 HISTORY — DX: Chronic systolic (congestive) heart failure: I50.22

## 2019-04-29 HISTORY — DX: Disorientation, unspecified: R41.0

## 2019-04-29 LAB — COMPREHENSIVE METABOLIC PANEL
ALT: 63 U/L — ABNORMAL HIGH (ref 0–44)
AST: 41 U/L (ref 15–41)
Albumin: 3.6 g/dL (ref 3.5–5.0)
Alkaline Phosphatase: 77 U/L (ref 38–126)
Anion gap: 17 — ABNORMAL HIGH (ref 5–15)
BUN: 12 mg/dL (ref 6–20)
CO2: 28 mmol/L (ref 22–32)
Calcium: 7.6 mg/dL — ABNORMAL LOW (ref 8.9–10.3)
Chloride: 74 mmol/L — ABNORMAL LOW (ref 98–111)
Creatinine, Ser: 1.32 mg/dL — ABNORMAL HIGH (ref 0.44–1.00)
GFR calc Af Amer: 52 mL/min — ABNORMAL LOW (ref >60)
GFR calc non Af Amer: 45 mL/min — ABNORMAL LOW (ref >60)
Glucose, Bld: 160 mg/dL — ABNORMAL HIGH (ref 70–99)
Potassium: 2.6 mmol/L — CL (ref 3.5–5.1)
Sodium: 119 mmol/L — CL (ref 135–145)
Total Bilirubin: 0.8 mg/dL (ref 0.3–1.2)
Total Protein: 6.4 g/dL — ABNORMAL LOW (ref 6.5–8.1)

## 2019-04-29 LAB — GLUCOSE, CAPILLARY
Glucose-Capillary: 117 mg/dL — ABNORMAL HIGH (ref 70–99)
Glucose-Capillary: 128 mg/dL — ABNORMAL HIGH (ref 70–99)
Glucose-Capillary: 128 mg/dL — ABNORMAL HIGH (ref 70–99)

## 2019-04-29 LAB — T4, FREE: Free T4: 0.96 ng/dL (ref 0.61–1.12)

## 2019-04-29 LAB — TSH: TSH: 1.868 u[IU]/mL (ref 0.350–4.500)

## 2019-04-29 LAB — CBC
HCT: 33.8 % — ABNORMAL LOW (ref 36.0–46.0)
Hemoglobin: 10.7 g/dL — ABNORMAL LOW (ref 12.0–15.0)
MCH: 25.1 pg — ABNORMAL LOW (ref 26.0–34.0)
MCHC: 31.7 g/dL (ref 30.0–36.0)
MCV: 79.3 fL — ABNORMAL LOW (ref 80.0–100.0)
Platelets: 489 10*3/uL — ABNORMAL HIGH (ref 150–400)
RBC: 4.26 MIL/uL (ref 3.87–5.11)
RDW: 16.7 % — ABNORMAL HIGH (ref 11.5–15.5)
WBC: 8.3 10*3/uL (ref 4.0–10.5)
nRBC: 0 % (ref 0.0–0.2)

## 2019-04-29 SURGERY — ECHOCARDIOGRAM, TRANSESOPHAGEAL
Anesthesia: Monitor Anesthesia Care

## 2019-04-29 MED ORDER — ENSURE ENLIVE PO LIQD: 237.0000 mL | Freq: Two times a day (BID) | ORAL | Status: DC

## 2019-04-29 MED ORDER — METHIMAZOLE 5 MG PO TABS
5.0000 mg | ORAL_TABLET | Freq: Every day | ORAL | Status: DC
  Administered 2019-04-30 – 2019-05-07 (×9): 5 mg via ORAL
  Filled 2019-04-29 (×9): qty 1

## 2019-04-29 MED ORDER — POTASSIUM CHLORIDE 10 MEQ/100ML IV SOLN
10.0000 meq | INTRAVENOUS | Status: AC
  Administered 2019-04-29 – 2019-04-30 (×5): 10 meq via INTRAVENOUS
  Filled 2019-04-29 (×3): qty 100

## 2019-04-29 MED ORDER — AMIODARONE HCL IN DEXTROSE 360-4.14 MG/200ML-% IV SOLN
30.0000 mg/h | INTRAVENOUS | Status: DC
  Administered 2019-04-29 – 2019-05-05 (×13): 30 mg/h via INTRAVENOUS
  Filled 2019-04-29 (×17): qty 200

## 2019-04-29 MED ORDER — LIDOCAINE 2% (20 MG/ML) 5 ML SYRINGE
INTRAMUSCULAR | Status: DC | PRN
  Administered 2019-04-29: 40 mg via INTRAVENOUS

## 2019-04-29 MED ORDER — SODIUM CHLORIDE 0.9 % IV SOLN
INTRAVENOUS | Status: DC | PRN
  Administered 2019-04-29 – 2019-05-02 (×6): 250 mL via INTRAVENOUS

## 2019-04-29 MED ORDER — PHENYLEPHRINE 40 MCG/ML (10ML) SYRINGE FOR IV PUSH (FOR BLOOD PRESSURE SUPPORT)
PREFILLED_SYRINGE | INTRAVENOUS | Status: DC | PRN
  Administered 2019-04-29 (×2): 80 ug via INTRAVENOUS

## 2019-04-29 MED ORDER — ONDANSETRON HCL 4 MG/2ML IJ SOLN
4.0000 mg | Freq: Once | INTRAMUSCULAR | Status: AC
  Administered 2019-04-29: 4 mg via INTRAVENOUS

## 2019-04-29 MED ORDER — FUROSEMIDE 40 MG PO TABS
40.0000 mg | ORAL_TABLET | Freq: Two times a day (BID) | ORAL | Status: DC
  Administered 2019-04-30 (×2): 40 mg via ORAL
  Filled 2019-04-29 (×2): qty 1

## 2019-04-29 MED ORDER — TRAMADOL HCL 50 MG PO TABS
50.0000 mg | ORAL_TABLET | Freq: Three times a day (TID) | ORAL | Status: DC | PRN
  Administered 2019-04-30 – 2019-05-01 (×4): 50 mg via ORAL
  Filled 2019-04-29 (×4): qty 1

## 2019-04-29 MED ORDER — ONDANSETRON HCL 4 MG/2ML IJ SOLN
INTRAMUSCULAR | Status: AC
  Filled 2019-04-29: qty 2

## 2019-04-29 MED ORDER — RIVAROXABAN 15 MG PO TABS
15.0000 mg | ORAL_TABLET | Freq: Every day | ORAL | Status: DC
  Administered 2019-04-30 – 2019-05-03 (×5): 15 mg via ORAL
  Filled 2019-04-29 (×5): qty 1

## 2019-04-29 MED ORDER — DULOXETINE HCL 60 MG PO CPEP
60.0000 mg | ORAL_CAPSULE | Freq: Two times a day (BID) | ORAL | Status: DC
  Administered 2019-04-30 – 2019-05-07 (×16): 60 mg via ORAL
  Filled 2019-04-29 (×17): qty 1

## 2019-04-29 MED ORDER — PROPOFOL 10 MG/ML IV BOLUS
INTRAVENOUS | Status: DC | PRN
  Administered 2019-04-29 (×3): 20 mg via INTRAVENOUS
  Administered 2019-04-29: 40 mg via INTRAVENOUS

## 2019-04-29 MED ORDER — ALPRAZOLAM 0.5 MG PO TABS
0.5000 mg | ORAL_TABLET | Freq: Three times a day (TID) | ORAL | Status: DC | PRN
  Administered 2019-04-30 – 2019-05-07 (×12): 0.5 mg via ORAL
  Filled 2019-04-29 (×13): qty 1

## 2019-04-29 MED ORDER — POTASSIUM CHLORIDE CRYS ER 20 MEQ PO TBCR
20.0000 meq | EXTENDED_RELEASE_TABLET | Freq: Two times a day (BID) | ORAL | Status: DC
  Administered 2019-04-30 – 2019-05-01 (×3): 20 meq via ORAL
  Filled 2019-04-29 (×3): qty 1

## 2019-04-29 MED ORDER — SODIUM CHLORIDE 0.9 % IV SOLN
INTRAVENOUS | Status: DC | PRN
  Administered 2019-04-29: 13:00:00 via INTRAVENOUS

## 2019-04-29 MED ORDER — ENSURE ENLIVE PO LIQD
237.0000 mL | Freq: Two times a day (BID) | ORAL | Status: DC
  Administered 2019-04-30 (×2): 237 mL via ORAL

## 2019-04-29 MED ORDER — AMIODARONE LOAD VIA INFUSION
150.0000 mg | Freq: Once | INTRAVENOUS | Status: AC
  Administered 2019-04-29: 150 mg via INTRAVENOUS
  Filled 2019-04-29: qty 83.34

## 2019-04-29 MED ORDER — PROMETHAZINE HCL 25 MG/ML IJ SOLN
12.5000 mg | Freq: Once | INTRAMUSCULAR | Status: AC
  Administered 2019-04-30: 12.5 mg via INTRAVENOUS
  Filled 2019-04-29: qty 1

## 2019-04-29 MED ORDER — ONDANSETRON HCL 4 MG/2ML IJ SOLN
4.0000 mg | Freq: Four times a day (QID) | INTRAMUSCULAR | Status: DC | PRN
  Administered 2019-04-29 – 2019-05-02 (×8): 4 mg via INTRAVENOUS
  Filled 2019-04-29 (×8): qty 2

## 2019-04-29 MED ORDER — METOPROLOL SUCCINATE ER 100 MG PO TB24
100.0000 mg | ORAL_TABLET | Freq: Every day | ORAL | Status: DC
  Administered 2019-04-30: 100 mg via ORAL
  Filled 2019-04-29 (×2): qty 1

## 2019-04-29 MED ORDER — AMIODARONE HCL IN DEXTROSE 360-4.14 MG/200ML-% IV SOLN
60.0000 mg/h | INTRAVENOUS | Status: AC
  Administered 2019-04-29 (×2): 60 mg/h via INTRAVENOUS
  Filled 2019-04-29 (×2): qty 200

## 2019-04-29 NOTE — Op Note (Signed)
Procedure: Electrical Cardioversion Indications:  Atrial Fibrillation  Procedure Details:  Consent: Risks of procedure as well as the alternatives and risks of each were explained to the (patient/caregiver).  Consent for procedure obtained.  Time Out: Verified patient identification, verified procedure, site/side was marked, verified correct patient position, special equipment/implants available, medications/allergies/relevent history reviewed, required imaging and test results available.  Performed  Patient placed on cardiac monitor, pulse oximetry, supplemental oxygen as necessary.  Sedation given: IV propofol, Dr. Valma Cava Pacer pads placed anterior and posterior chest.  Cardioverted 1 time(s).  Cardioversion with synchronized biphasic 120J shock.  Evaluation: Findings: Post procedure EKG shows: NSR, very frequent PACs Complications: None Patient did tolerate procedure well.  Time Spent Directly with the Patient:  30 minutes   Ferol Laiche 04/29/2019, 1:09 PM

## 2019-04-29 NOTE — Progress Notes (Signed)
Dr.Berge called back gave orders for PIV new insert for KCL runs reported would input orders. Also requested orders for xrays of left hand/arm and right shoulder c/o pain swelling since she fell prior coming to hospital.Await orders be confirmed via pharmacy for zofran patient c/o n/v.

## 2019-04-29 NOTE — Anesthesia Procedure Notes (Signed)
Procedure Name: MAC Date/Time: 04/29/2019 12:55 PM Performed by: Renato Shin, CRNA Pre-anesthesia Checklist: Patient identified, Emergency Drugs available, Patient being monitored and Suction available Patient Re-evaluated:Patient Re-evaluated prior to induction Oxygen Delivery Method: Nasal cannula Preoxygenation: Pre-oxygenation with 100% oxygen Induction Type: IV induction Placement Confirmation: positive ETCO2 and breath sounds checked- equal and bilateral Dental Injury: Teeth and Oropharynx as per pre-operative assessment

## 2019-04-29 NOTE — Op Note (Signed)
INDICATIONS: atrial fibrillation  PROCEDURE:   Informed consent was obtained prior to the procedure. The risks, benefits and alternatives for the procedure were discussed and the patient comprehended these risks.  Risks include, but are not limited to, cough, sore throat, vomiting, nausea, somnolence, esophageal and stomach trauma or perforation, bleeding, low blood pressure, aspiration, pneumonia, infection, trauma to the teeth and death.    After a procedural time-out, the oropharynx was anesthetized with 20% benzocaine spray.   During this procedure the patient was administered IV propofol by Anesthesiology, Dr. Valma Cava.  The transesophageal probe was inserted in the esophagus and stomach without difficulty and multiple views were obtained.  The patient was kept under observation until the patient left the procedure room.  The patient left the procedure room in stable condition.   Agitated microbubble saline contrast was not administered.  COMPLICATIONS:    There were no immediate complications.  FINDINGS:  Severely depressed LV systolic function, EF [id number], global hypokinesis. Mildly dilated left atrium. Mild MR.  RECOMMENDATIONS:     Proceed w cardioversion. Begin evaluation and treatment for CHF/severe cardiomyopathy.  Time Spent Directly with the Patient:  30 minutes   Eriko Economos 04/29/2019, 1:07 PM

## 2019-04-29 NOTE — Interval H&P Note (Signed)
History and Physical Interval Note:  04/29/2019 12:19 PM  Brianna Porter  has presented today for surgery, with the diagnosis of AFLUTTER.  The various methods of treatment have been discussed with the patient and family. After consideration of risks, benefits and other options for treatment, the patient has consented to  Procedure(s): TRANSESOPHAGEAL ECHOCARDIOGRAM (TEE) (N/A) CARDIOVERSION (N/A) as a surgical intervention.  The patient's history has been reviewed, patient examined, no change in status, stable for surgery.  I have reviewed the patient's chart and labs.  Questions were answered to the patient's satisfaction.     [name]

## 2019-04-29 NOTE — Progress Notes (Signed)
Brianna Porter on call messaged her about critical labs and need for n/v med. Await return of call.

## 2019-04-29 NOTE — Progress Notes (Signed)
Amiodarone Drug - Drug Interaction Consult Note  Recommendations: - Monitor for increased risk of bradycardia, AV block, and myocardial depression with concurrent Toprol - Ensure K levels stay wnl with concurrent diuretics to avoid risk of cardiotoxicity  Amiodarone is metabolized by the cytochrome P450 system and therefore has the potential to cause many drug interactions. Amiodarone has an average plasma half-life of 50 days (range 20 to 100 days).   There is potential for drug interactions to occur several weeks or months after stopping treatment and the onset of drug interactions may be slow after initiating amiodarone.   []  Statins: Increased risk of myopathy. Simvastatin- restrict dose to 20mg  daily. Other statins: counsel patients to report any muscle pain or weakness immediately.  []  Anticoagulants: Amiodarone can increase anticoagulant effect. Consider warfarin dose reduction. Patients should be monitored closely and the dose of anticoagulant altered accordingly, remembering that amiodarone levels take several weeks to stabilize.  []  Antiepileptics: Amiodarone can increase plasma concentration of phenytoin, the dose should be reduced. Note that small changes in phenytoin dose can result in large changes in levels. Monitor patient and counsel on signs of toxicity.  [x]  Beta blockers: increased risk of bradycardia, AV block and myocardial depression. Sotalol - avoid concomitant use.  []   Calcium channel blockers (diltiazem and verapamil): increased risk of bradycardia, AV block and myocardial depression.  []   Cyclosporine: Amiodarone increases levels of cyclosporine. Reduced dose of cyclosporine is recommended.  []  Digoxin dose should be halved when amiodarone is started.  [x]  Diuretics: increased risk of cardiotoxicity if hypokalemia occurs.  []  Oral hypoglycemic agents (glyburide, glipizide, glimepiride): increased risk of hypoglycemia. Patient's glucose levels should be monitored  closely when initiating amiodarone therapy.   []  Drugs that prolong the QT interval:  Torsades de pointes risk may be increased with concurrent use - avoid if possible.  Monitor QTc, also keep magnesium/potassium WNL if concurrent therapy can't be avoided. Marland Kitchen Antibiotics: e.g. fluoroquinolones, erythromycin. . Antiarrhythmics: e.g. quinidine, procainamide, disopyramide, sotalol. . Antipsychotics: e.g. phenothiazines, haloperidol.  . Lithium, tricyclic antidepressants, and methadone.  Thank You,  Alycia Rossetti, PharmD, BCPS Pager: [phone] 4:48 PM

## 2019-04-29 NOTE — Progress Notes (Signed)
Has Abraison right distal knee 1cm X 1cm,right anterior leg skin tear/abraison 0.5 X0.5 cm,left knee abraison 0.5cm X0.5cm. She has bruising on her arms and legs bilaterally from fall is on xarelto at home also has brusing on left face cheek. Daughter present with patient on admission.  MD paged for possible xray orders due to c/o pain post fall right shoulder and right hand.

## 2019-04-29 NOTE — H&P (Addendum)
Cardiology Admission History and Physical:   Patient ID: Tationna Marandola MRN: [id number]; DOB: 05-02-1961   Admission date: 04/29/2019  Primary Care Provider: Cher Nakai, MD Primary Cardiologist: Shirlee More, MD  Primary Electrophysiologist:  None   Chief Complaint:  Symptomatic atrial fibrillation/flutter, newly diagnosed systolic HF  Patient Profile:   Stephie Flink is a 58 y.o. female with a h/o hyperthyroidism being treated w/ methimazole, recent diagnosis of atrial fibrillation/ atrial flutter, CHA2DS2 VASc score of 4 (CHF, HTN, DM and Female), on Xarelto for a/c, new diagnosis of systolic HF, Stage III CKD, HTN, HLD and DM, being admitted for CHF w/u and management of her atrial arrthymias.   History of Present Illness:   Ms. Kosmalski is followed by [facility]. PCP is Dr. Cher Nakai in [place]. Limited past medical records available for review, but based on review of PMH and home meds, she is being treated for hyperthyroidism and is currently on methimazole, prescribed 02/2019. Other past medical problems include T2DM, Stage III CKD, HTN, HLD, IDA, anxiety/depression and h/o elevated liver function test.   She was recently diagnosed w/ atrial fibrillation and atrial flutter and referred to Dr. Bettina Gavia for evaluation. He started her on metoprolol for rate control, however unable to tolerate high doses due to soft BP. Oral amiodarone later added. Given elevated CHA2DS2 VASc score, she was initially started on Eliquis but did not tolerate due to itching and she was switched to Xarelto, reduced dose, 15 mg daily, given reduced renal function. Given persistent atrial fib/flutter and limited ability to tolerated rate control due to borderline hypotension, she was referred for outpatient TEE guided DCCV.   Pt presented to [facility] today for planned procedure. TEE showed severely reduced LVEF at 20% w/ global hypokinesis. No LA thrombus. She was cardioverted back to NSR after 1 synchronized  biphasic 120J shock. Post procedural EKG showed NSR with very frequent PACs, atrial couplets and brief nonsustained PAT. Borderline hypotensive. Given her frequent atrial ectopy and newly diagnosed systolic HF, decision was made to admit for further CHF w/u and initiation of AAD therapy. Pt started on IV amiodarone w/ bolus.     Heart Pathway Score:     Past Medical History:  Diagnosis Date  . Anxiety and depression   . Atrial flutter ([place])   . Chronic kidney disease   . Diabetes ([place])   . Edema   . Elevated liver function tests   . Hypercholesteremia   . Hyperkalemia   . Hypertension   . Hyperthyroidism   . Iron deficiency anemia     Past Surgical History:  Procedure Laterality Date  . TUBAL LIGATION       Medications Prior to Admission: Prior to Admission medications   Medication Sig Start Date End Date Taking? Authorizing Provider  ALPRAZolam Duanne Moron) 0.5 MG tablet Take 0.5 mg by mouth 3 (three) times daily as needed for anxiety.  02/14/19  Yes [provider]  Ascorbic Acid (VITAMIN C) 1000 MG tablet Take 1,000 mg by mouth daily.   Yes [provider]  cholecalciferol (VITAMIN D3) 25 MCG (1000 UT) tablet Take 1,000 Units by mouth daily.   Yes [provider]  DULoxetine (CYMBALTA) 60 MG capsule Take 60 mg by mouth 2 (two) times daily. 02/26/19  Yes [provider]  fenofibrate (TRICOR) 48 MG tablet Take 48 mg by mouth at bedtime. 02/26/19  Yes [provider]  furosemide (LASIX) 40 MG tablet Take 1 tablet (40 mg total) by mouth 2 (two) times  daily. 04/23/19  Yes Richardo Priest, MD  gabapentin (NEURONTIN) 800 MG tablet Take 800 mg by mouth 3 (three) times daily as needed (pain).  02/26/19  Yes [provider]  Menthol, Topical Analgesic, (ICY HOT ADVANCED RELIEF EX) Apply 1 application topically as needed (back pain).   Yes [provider]  metFORMIN (GLUCOPHAGE) 850 MG tablet Take 850 mg by mouth 2 (two) times daily. 02/26/19   Yes [provider]  methimazole (TAPAZOLE) 5 MG tablet Take 5 mg by mouth daily.  02/21/19  Yes [provider]  metoprolol succinate (TOPROL-XL) 100 MG 24 hr tablet Take 100 mg by mouth daily.  02/26/19  Yes [provider]  potassium chloride SA (K-DUR) 20 MEQ tablet Take 1 tablet (20 mEq total) by mouth 2 (two) times daily. 04/24/19  Yes Richardo Priest, MD  promethazine (PHENERGAN) 25 MG tablet Take 25 mg by mouth every 6 (six) hours as needed for nausea or vomiting.    Yes [provider]  Rivaroxaban (XARELTO) 15 MG TABS tablet Take 1 tablet (15 mg total) by mouth daily with supper. 04/23/19  Yes Richardo Priest, MD  simvastatin (ZOCOR) 80 MG tablet Take 40 mg by mouth at bedtime.  11/15/18  Yes [provider]  traMADol (ULTRAM) 50 MG tablet Take 1 tablet by mouth 3 (three) times daily as needed (pain).  02/14/19  Yes [provider]  vitamin B-12 (CYANOCOBALAMIN) 1000 MCG tablet Take 1,000 mcg by mouth daily.   Yes [provider]  cetirizine (ZYRTEC) 10 MG tablet Take 10 mg by mouth as needed for allergies.    [provider]     Allergies:   No Known Allergies  Social History:   Social History   Socioeconomic History  . Marital status: Married    Spouse name: Not on file  . Number of children: Not on file  . Years of education: Not on file  . Highest education level: Not on file  Occupational History  . Not on file  Social Needs  . Financial resource strain: Not on file  . Food insecurity    Worry: Not on file    Inability: Not on file  . Transportation needs    Medical: Not on file    Non-medical: Not on file  Tobacco Use  . Smoking status: Former Smoker    Packs/day: 2.00    Years: 30.00    Pack years: 60.00    Types: Cigarettes    Quit date: 2001    Years since quitting: 19.7  . Smokeless tobacco: Never Used  Substance and Sexual Activity  . Alcohol use: Never    Frequency: Never  . Drug use:  Never  . Sexual activity: Not on file  Lifestyle  . Physical activity    Days per week: Not on file    Minutes per session: Not on file  . Stress: Not on file  Relationships  . Social Herbalist on phone: Not on file    Gets together: Not on file    Attends religious service: Not on file    Active member of club or organization: Not on file    Attends meetings of clubs or organizations: Not on file    Relationship status: Not on file  . Intimate partner violence    Fear of current or ex partner: Not on file    Emotionally abused: Not on file    Physically abused: Not on  file    Forced sexual activity: Not on file  Other Topics Concern  . Not on file  Social History Narrative  . Not on file    Family History:   The patient's family history includes Cancer in her mother; Heart disease in her mother; Hypercholesterolemia in her mother; Osteoarthritis in her mother; Stroke in her mother.    ROS:  Please see the history of present illness.  All other ROS reviewed and negative.     Physical Exam/Data:   Vitals:   04/29/19 1330 04/29/19 1340 04/29/19 1350 04/29/19 1355  BP: (!) 83/59 (!) 84/58 105/75 (!) 95/43  Pulse: 75 (!) 57 (!) 36 99  Resp: (!) 28 13 13 11   Temp:      TempSrc:      SpO2: 100% 100% 100% 100%  Weight:      Height:        Intake/Output Summary (Last 24 hours) at 04/29/2019 1425 Last data filed at 04/29/2019 1316 Gross per 24 hour  Intake 250 ml  Output -  Net 250 ml   Last 3 Weights 04/29/2019 04/23/2019  Weight (lbs) 138 lb 139 lb  Weight (kg) 62.596 kg 63.05 kg     Body mass index is 24.45 kg/m.  General:  Well nourished, well developed, in no acute distress HEENT: normal Lymph: no adenopathy Neck: no JVD Endocrine:  No thryomegaly Vascular: No carotid bruits; FA pulses 2+ bilaterally without bruits  Cardiac:  normal S1, S2; RRR; no murmur  Lungs:  clear to auscultation bilaterally, no wheezing, rhonchi or rales  Abd: soft, nontender,  no hepatomegaly  Ext: no  edema Musculoskeletal:  No deformities, BUE and BLE strength normal and equal Skin: warm and dry  Neuro:  CNs 2-12 intact, no focal abnormalities noted Psych:  Normal affect    EKG:  The ECG that was done 9/9 (post DCCV) was personally reviewed and demonstrates sinus tach 108 bpm w/ frequent PACs, LAD, nonspecific t wave abnormalties   Relevant CV Studies: TEE 04/29/19 FINDINGS:  Severely depressed LV systolic function, EF 123456, global hypokinesis. Mildly dilated left atrium. Mild MR.   Laboratory Data:  High Sensitivity Troponin:  No results for input(s): TROPONINIHS in the last 720 hours.    Chemistry Recent Labs  Lab 04/23/19 1233  NA 133*  K 3.3*  CL 88*  CO2 29  GLUCOSE 102*  BUN 13  CREATININE 1.33*  CALCIUM 9.4  GFRNONAA 44*  GFRAA 51*    No results for input(s): PROT, ALBUMIN, AST, ALT, ALKPHOS, BILITOT in the last 168 hours. Hematology Recent Labs  Lab 04/23/19 1233  WBC 9.6  RBC 4.16  HGB 10.2*  HCT 34.8  MCV 84  MCH 24.5*  MCHC 29.3*  RDW 16.5*  PLT 533*   BNP Recent Labs  Lab 04/23/19 1233  PROBNP 3,353*    DDimer No results for input(s): DDIMER in the last 168 hours.   Radiology/Studies:  No results found.  Assessment and Plan:   Lyda Lundin is a 58 y.o. female with a h/o hyperthyroidism being treated w/ methimazole, recent diagnosis of atrial fibrillation/ atrial flutter, CHA2DS2 VASc score of 4 (CHF, HTN, DM and Female), on Xarelto for a/c, new diagnosis of systolic HF, Stage III CKD, HTN, HLD and DM, being admitted for CHF w/u and management of her atrial arrthymias.   1. Atrial Fibrillation/ Atrial Flutter: fairly new diagnosis. In the setting of hyperthyroidism. TEE w/ severely reduced LVEF at 20%, mildly dilated LA and  mild MR. CHA2DS2 VASc score of 4 (CHF, HTN, DM and Female). This patients CHA2DS2-VASc Score and unadjusted Ischemic Stroke Rate (% per year) is equal to 4.8 % stroke rate/year from a score  of 4. S/p successful DCCV 04/29/19 w/ post procedural EKG showing NSR w/ frequent PACs, atrial couplets and brief nonsustained PAT.  Adding AAD therapy to help maintain NSR>>Plan amiodarone load  Will hold metoprolol due to borderline hypotension. Resume tomorrow if BP can tolerate  Monitor closely on tele  Will check labs: K, Mg, TSH, Free T3/T4  Keep Electrolytes stable  Continue methimazole for hyperthyroidism  Continue Xarelto for a/c, reduced dose 15 mg daily due to CrCl <50 mL/min  Given underlying thyroid disease, and PMH documentation of elevated liver function test, long term amiodarone may not be a good AAD choice. Consider EP evaluation tomorrow. ? Tikosyn. Will check HFTs.  If concern for underlying OSA, consider outpatient sleep study. Will defer to primary cardiologist.    2. Cardiomyopathy/ Systolic HF: new diagnosis. TEE 04/29/19 showed severely reduced LVEF at 20% w/ global hypokinesis.   She has multiple risk factors for ischemic heart disease, but suspect CM likely tachy mediated from rapid afib  S/p cardioversion. Continue rhythm control strategy  Recommend repeat 2D echo in 2-3 months to reassess LVEF.  If EF not improved despite maintaining NSR, later consider ischemic evaluation   Toprol XL held today for hypotension. Resume tomorrow if BP allows  BP too soft currently for initiation of an ACE/ARB/ ARNI. Consider adding when able to tolerate  Given concomitant T2DM, later consider addition of an SGLT2i   3. Hyperthyroidism: limited past medical records available. Followed primarily by [facility] in [place]. On methimazole as outpatient. Prescribed 02/2019. This may be the driver behind her atrial fibrillation/flutter  Continue home methimazole dose   Will order TSH, Free T3/T4 to see if she has reached euthyroid state  4. HTN: BP low/ borderline hypotensive.  Monitor w/ IV amiodarone  Holding home metoprolol today   5. Stage III CKD:    Check BMP  6. DM:  SSI   For questions or updates, please contact [place] Please consult [email or web address] for contact info under      Signed, Lyda Jester, PA-C  04/29/2019 2:25 PM   I have seen and examined the patient along with Lyda Jester, PA-C .  I have reviewed the chart, notes and new data.  I agree with PA/NP's note.  Key new complaints: denies specific complaints, just generally unwell. Key examination changes: remains mildly hypotensive since the cardioversion. Very frequent ectopic beats (PACs on telemetry) Key new findings / data: TEE findings discussed. In sinus rhythm with very frequent PACs/couplets/brief PAT)ECG shows very spiked ectopic P waves - consider pulmonary vein source. QT hard to measure accurately.  PLAN: Admit for IV amiodarone loading and further evaluation and treatment of newly diagnosed CHF/systolic LV dysfunction (hopefully tachycardia-related CMP and/or thyrotoxicosis). Watch for amiodarone-thyroid function interaction, but there are few antiarrhythmic alternatives due to severity of LV dysfunction (Consider dofetilide, but hard to assess QT). D/W Dr. Bettina Gavia.  Sanda Klein, MD, [place] [phone] 04/29/2019, 4:45 PM

## 2019-04-29 NOTE — Anesthesia Postprocedure Evaluation (Signed)
Anesthesia Post Note  Patient: Brianna Porter  Procedure(s) Performed: TRANSESOPHAGEAL ECHOCARDIOGRAM (TEE) (N/A ) CARDIOVERSION (N/A )     Patient location during evaluation: PACU Anesthesia Type: MAC Level of consciousness: awake and alert Pain management: pain level controlled Vital Signs Assessment: post-procedure vital signs reviewed and stable Respiratory status: spontaneous breathing, nonlabored ventilation, respiratory function stable and patient connected to nasal cannula oxygen Cardiovascular status: stable and blood pressure returned to baseline Postop Assessment: no apparent nausea or vomiting Anesthetic complications: no    Last Vitals:  Vitals:   04/29/19 1228 04/29/19 1314  BP: 98/66 (!) 89/61  Pulse: (!) 125 90  Resp: 16 16  Temp: 36.9 C 36.5 C  SpO2: 100% 91%    Last Pain:  Vitals:   04/29/19 1314  TempSrc: Temporal  PainSc: 0-No pain                 Barnet Glasgow

## 2019-04-29 NOTE — Transfer of Care (Signed)
Immediate Anesthesia Transfer of Care Note  Patient: Brianna Porter  Procedure(s) Performed: TRANSESOPHAGEAL ECHOCARDIOGRAM (TEE) (N/A ) CARDIOVERSION (N/A )  Patient Location: PACU and Endoscopy Unit  Anesthesia Type:MAC  Level of Consciousness: awake, alert  and patient cooperative  Airway & Oxygen Therapy: Patient Spontanous Breathing and Patient connected to nasal cannula oxygen  Post-op Assessment: Report given to RN and Post -op Vital signs reviewed and stable  Post vital signs: Reviewed and stable  Last Vitals:  Vitals Value Taken Time  BP 89/61 04/29/19 1315  Temp    Pulse 90 04/29/19 1314  Resp 18 04/29/19 1316  SpO2 91 % 04/29/19 1314  Vitals shown include unvalidated device data.  Last Pain:  Vitals:   04/29/19 1228  TempSrc: Oral  PainSc: 8          Complications: No apparent anesthesia complications

## 2019-04-29 NOTE — Anesthesia Preprocedure Evaluation (Signed)
Anesthesia Evaluation  Patient identified by MRN, date of birth, ID band Patient awake    Reviewed: Allergy & Precautions, NPO status , Patient's Chart, lab work & pertinent test results  Airway Mallampati: III  TM Distance: >3 FB Neck ROM: Full    Dental no notable dental hx. (+) Poor Dentition   Pulmonary former smoker,    Pulmonary exam normal breath sounds clear to auscultation       Cardiovascular hypertension, Pt. on medications Normal cardiovascular exam Rhythm:Regular Rate:Normal     Neuro/Psych Depression    GI/Hepatic negative GI ROS, Neg liver ROS,   Endo/Other  diabetes, Type 2Hyperthyroidism   Renal/GU Cr 1.33 K+ 3.3     Musculoskeletal   Abdominal   Peds  Hematology  (+) anemia , Plt 533 Hgb 10.2   Anesthesia Other Findings   Reproductive/Obstetrics                             Lab Results  Component Value Date   WBC 9.6 04/23/2019   HGB 10.2 (L) 04/23/2019   HCT 34.8 04/23/2019   MCV 84 04/23/2019   PLT 533 (H) 04/23/2019   Lab Results  Component Value Date   CREATININE 1.33 (H) 04/23/2019   BUN 13 04/23/2019   NA 133 (L) 04/23/2019   K 3.3 (L) 04/23/2019   CL 88 (L) 04/23/2019   CO2 29 04/23/2019     Anesthesia Physical Anesthesia Plan  ASA: III  Anesthesia Plan: MAC   Post-op Pain Management:    Induction: Intravenous  PONV Risk Score and Plan: Treatment may vary due to age or medical condition  Airway Management Planned: Natural Airway  Additional Equipment:   Intra-op Plan:   Post-operative Plan:   Informed Consent: I have reviewed the patients History and Physical, chart, labs and discussed the procedure including the risks, benefits and alternatives for the proposed anesthesia with the patient or authorized representative who has indicated his/her understanding and acceptance.     Dental advisory given  Plan Discussed with:    Anesthesia Plan Comments:         Anesthesia Quick Evaluation

## 2019-04-29 NOTE — Progress Notes (Signed)
  Echocardiogram Echocardiogram Transesophageal has been performed.  Roseanna Rainbow R 04/29/2019, 1:10 PM

## 2019-04-29 NOTE — Progress Notes (Signed)
Mildly hypotensive post procedure, but alert and oriented, not tachypneic lying in bed. Very frequent PACs, atrial couplets, brief nonsustained PAT. Discussed findings with Dr. Bettina Gavia (LVEF 20%) and we both recommend hospitalization to initiate CHF work up and antiarrhythmic therapy. Hopefully this is tachycardia-related cardiomyopathy. Also discussed with the patient (and her daughter, Brianna Porter). The patient agrees to hospital admission.  Brianna Klein, MD, [facility] [phone] office [phone] pager

## 2019-04-30 ENCOUNTER — Encounter (HOSPITAL_COMMUNITY): Payer: Self-pay | Admitting: General Practice

## 2019-04-30 DIAGNOSIS — I5022 Chronic systolic (congestive) heart failure: Secondary | ICD-10-CM

## 2019-04-30 DIAGNOSIS — N179 Acute kidney failure, unspecified: Secondary | ICD-10-CM

## 2019-04-30 DIAGNOSIS — E0590 Thyrotoxicosis, unspecified without thyrotoxic crisis or storm: Secondary | ICD-10-CM

## 2019-04-30 DIAGNOSIS — R9431 Abnormal electrocardiogram [ECG] [EKG]: Secondary | ICD-10-CM | POA: Diagnosis present

## 2019-04-30 DIAGNOSIS — E1110 Type 2 diabetes mellitus with ketoacidosis without coma: Secondary | ICD-10-CM

## 2019-04-30 LAB — OSMOLALITY: Osmolality: 255 mOsm/kg — ABNORMAL LOW (ref 275–295)

## 2019-04-30 LAB — CBC
HCT: 32.8 % — ABNORMAL LOW (ref 36.0–46.0)
Hemoglobin: 10.5 g/dL — ABNORMAL LOW (ref 12.0–15.0)
MCH: 24.9 pg — ABNORMAL LOW (ref 26.0–34.0)
MCHC: 32 g/dL (ref 30.0–36.0)
MCV: 77.7 fL — ABNORMAL LOW (ref 80.0–100.0)
Platelets: 394 10*3/uL (ref 150–400)
RBC: 4.22 MIL/uL (ref 3.87–5.11)
RDW: 16.4 % — ABNORMAL HIGH (ref 11.5–15.5)
WBC: 8.5 10*3/uL (ref 4.0–10.5)
nRBC: 0 % (ref 0.0–0.2)

## 2019-04-30 LAB — BASIC METABOLIC PANEL
Anion gap: 16 — ABNORMAL HIGH (ref 5–15)
BUN: 7 mg/dL (ref 6–20)
CO2: 26 mmol/L (ref 22–32)
Calcium: 8.1 mg/dL — ABNORMAL LOW (ref 8.9–10.3)
Chloride: 84 mmol/L — ABNORMAL LOW (ref 98–111)
Creatinine, Ser: 1.08 mg/dL — ABNORMAL HIGH (ref 0.44–1.00)
GFR calc Af Amer: >60 mL/min (ref >60)
GFR calc non Af Amer: 57 mL/min — ABNORMAL LOW (ref >60)
Glucose, Bld: 119 mg/dL — ABNORMAL HIGH (ref 70–99)
Potassium: 3 mmol/L — ABNORMAL LOW (ref 3.5–5.1)
Sodium: 126 mmol/L — ABNORMAL LOW (ref 135–145)

## 2019-04-30 LAB — OSMOLALITY, URINE: Osmolality, Ur: 145 mOsm/kg — ABNORMAL LOW (ref 300–900)

## 2019-04-30 LAB — T3, FREE: T3, Free: 2.1 pg/mL (ref 2.0–4.4)

## 2019-04-30 LAB — GLUCOSE, CAPILLARY
Glucose-Capillary: 115 mg/dL — ABNORMAL HIGH (ref 70–99)
Glucose-Capillary: 125 mg/dL — ABNORMAL HIGH (ref 70–99)
Glucose-Capillary: 188 mg/dL — ABNORMAL HIGH (ref 70–99)

## 2019-04-30 LAB — LIPASE, BLOOD: Lipase: 25 U/L (ref 11–51)

## 2019-04-30 LAB — T4, FREE: Free T4: 1.12 ng/dL (ref 0.61–1.12)

## 2019-04-30 LAB — TSH: TSH: 3.246 u[IU]/mL (ref 0.350–4.500)

## 2019-04-30 LAB — SODIUM, URINE, RANDOM: Sodium, Ur: 52 mmol/L

## 2019-04-30 LAB — CORTISOL-AM, BLOOD: Cortisol - AM: 13.8 ug/dL (ref 6.7–22.6)

## 2019-04-30 LAB — POTASSIUM: Potassium: 3.6 mmol/L (ref 3.5–5.1)

## 2019-04-30 LAB — OCCULT BLOOD X 1 CARD TO LAB, STOOL: Fecal Occult Bld: NEGATIVE

## 2019-04-30 LAB — SODIUM: Sodium: 125 mmol/L — ABNORMAL LOW (ref 135–145)

## 2019-04-30 LAB — MAGNESIUM: Magnesium: 1.2 mg/dL — ABNORMAL LOW (ref 1.7–2.4)

## 2019-04-30 MED ORDER — POTASSIUM CHLORIDE 10 MEQ/100ML IV SOLN
INTRAVENOUS | Status: AC
  Administered 2019-04-30: 10 meq via INTRAVENOUS
  Filled 2019-04-30: qty 100

## 2019-04-30 MED ORDER — ENSURE ENLIVE PO LIQD
237.0000 mL | Freq: Three times a day (TID) | ORAL | Status: DC
  Administered 2019-04-30 – 2019-05-07 (×10): 237 mL via ORAL

## 2019-04-30 MED ORDER — SUCRALFATE 1 G PO TABS
1.0000 g | ORAL_TABLET | Freq: Three times a day (TID) | ORAL | Status: DC
  Administered 2019-04-30 – 2019-05-07 (×26): 1 g via ORAL
  Filled 2019-04-30 (×26): qty 1

## 2019-04-30 MED ORDER — MELATONIN 3 MG PO TABS
3.0000 mg | ORAL_TABLET | Freq: Once | ORAL | Status: DC
  Filled 2019-04-30: qty 1

## 2019-04-30 MED ORDER — MAGNESIUM SULFATE 2 GM/50ML IV SOLN
2.0000 g | Freq: Once | INTRAVENOUS | Status: AC
  Administered 2019-04-30: 2 g via INTRAVENOUS
  Filled 2019-04-30: qty 50

## 2019-04-30 MED ORDER — POTASSIUM CHLORIDE CRYS ER 20 MEQ PO TBCR
40.0000 meq | EXTENDED_RELEASE_TABLET | ORAL | Status: AC
  Administered 2019-04-30: 40 meq via ORAL
  Filled 2019-04-30: qty 2

## 2019-04-30 MED ORDER — ADULT MULTIVITAMIN W/MINERALS CH
1.0000 | ORAL_TABLET | Freq: Every day | ORAL | Status: DC
  Administered 2019-04-30 – 2019-05-07 (×8): 1 via ORAL
  Filled 2019-04-30 (×8): qty 1

## 2019-04-30 NOTE — Progress Notes (Signed)
Pt having nausea with vomiting after eating dinner, although Zofran was given before dinner. Pt continued with nausea and vomiting until after midnight. Zofran given again. MD paged. Orders received for Phenergan IV x1. Pt also c/o pain and anxiety. Orders received for Tramadol and Xanax. Pt's daughter states that the patient takes Xanax up to 4 times per day, however TID PRN is listed on pt's PTA MAR.

## 2019-04-30 NOTE — TOC Benefit Eligibility Note (Signed)
Transition of Care [facility]) Benefit Eligibility Note    Patient Details  Name: Zaraiah Burnard MRN: [id number] Date of Birth: 11-Dec-1960   Medication/Dose: Tikosyn 500 bid, 250 bid, or 125 bid Not Covered but Dofetilde is at 100.00 Copay for 30 day supply  Covered?: No     Prescription Coverage Preferred Pharmacy: [facility]  Spoke with Person/Company/Phone Number:: Abigail Butts  Co-Pay: Not Covered     Deductible: Unmet(195.00)    Tikosyn is not covered but Dofetilde is at 100.00 for a 30 day supply    [employer] Phone Number: 04/30/2019, 11:36 AM

## 2019-04-30 NOTE — Progress Notes (Addendum)
Progress Note  Patient Name: Brianna Porter Date of Encounter: 04/30/2019  Primary Cardiologist: Shirlee More, MD   Subjective   Had a "rough night". Did not sleep well. Feels palpitations. No significant dyspnea. No CP, dizziness, syncope/ near syncope. Daughter at bedside.   Inpatient Medications    Scheduled Meds: . DULoxetine  60 mg Oral BID  . feeding supplement (ENSURE ENLIVE)  237 mL Oral BID BM  . furosemide  40 mg Oral BID  . Melatonin  3 mg Oral Once  . methimazole  5 mg Oral Daily  . metoprolol succinate  100 mg Oral Daily  . potassium chloride SA  20 mEq Oral BID  . Rivaroxaban  15 mg Oral Q supper   Continuous Infusions: . sodium chloride 250 mL (04/30/19 0307)  . amiodarone 30 mg/hr (04/30/19 0306)   PRN Meds: sodium chloride, ALPRAZolam, ondansetron (ZOFRAN) IV, traMADol   Vital Signs    Vitals:   04/29/19 2241 04/30/19 0119 04/30/19 0549 04/30/19 0817  BP: 104/82 (!) 122/99 115/79 104/71  Pulse: 86 94 86 (!) 128  Resp: 20 20 20 18   Temp: 98.3 F (36.8 C) 98 F (36.7 C) 97.9 F (36.6 C) 98.3 F (36.8 C)  TempSrc: Oral Oral Oral Oral  SpO2: 99% 100% 99% 100%  Weight:      Height:        Intake/Output Summary (Last 24 hours) at 04/30/2019 0841 Last data filed at 04/30/2019 0120 Gross per 24 hour  Intake 720.4 ml  Output 1000 ml  Net -279.6 ml   Last 3 Weights 04/30/2019 04/29/2019 04/29/2019  Weight (lbs) (No Data) 136 lb 12.8 oz 138 lb  Weight (kg) (No Data) 62.052 kg 62.596 kg      Telemetry    Atrial flutter 120s - Personally Reviewed  ECG    EKG at midnight showed Sinus tach 101 bpm w/ PACs, repeating 12 lead now ? Recurrent atrial flutter on tele- Personally Reviewed  Physical Exam   GEN: middle aged WF in No acute distress.   Neck: No JVD Cardiac: irregular rthythm, tachy rate   Respiratory: Clear to auscultation bilaterally. GI: Soft, nontender, non-distended  MS: No edema; No deformity. Neuro:  Nonfocal  Psych: Normal  affect   Labs    High Sensitivity Troponin:  No results for input(s): TROPONINIHS in the last 720 hours.    Chemistry Recent Labs  Lab 04/23/19 1233 04/29/19 1628 04/30/19 0529  NA 133* 119*  --   K 3.3* 2.6* 3.6  CL 88* 74*  --   CO2 29 28  --   GLUCOSE 102* 160*  --   BUN 13 12  --   CREATININE 1.33* 1.32*  --   CALCIUM 9.4 7.6*  --   PROT  --  6.4*  --   ALBUMIN  --  3.6  --   AST  --  41  --   ALT  --  63*  --   ALKPHOS  --  77  --   BILITOT  --  0.8  --   GFRNONAA 44* 45*  --   GFRAA 51* 52*  --   ANIONGAP  --  17*  --      Hematology Recent Labs  Lab 04/23/19 1233 04/29/19 1628 04/30/19 0529  WBC 9.6 8.3 8.5  RBC 4.16 4.26 4.22  HGB 10.2* 10.7* 10.5*  HCT 34.8 33.8* 32.8*  MCV 84 79.3* 77.7*  MCH 24.5* 25.1* 24.9*  MCHC 29.3* 31.7 32.0  RDW 16.5* 16.7* 16.4*  PLT 533* 489* 394    BNP Recent Labs  Lab 04/23/19 1233  PROBNP 3,353*     DDimer No results for input(s): DDIMER in the last 168 hours.   Radiology    No results found.  Cardiac Studies/ Procedures    TEE 04/29/19 IMPRESSIONS    1. The left ventricle has severely reduced systolic function, with an ejection fraction of 20-25%. The cavity size was normal. Left ventrical global hypokinesis without regional wall motion abnormalities.  2. The right ventricle has severely reduced systolic function. The cavity was normal. There is no increase in right ventricular wall thickness. Right ventricular systolic pressure is normal.  3. No evidence of a thrombus present in the left atrial appendage.  4. Mild mitral insufficiency. The jet is centrally-directed.  5. The aortic root, ascending aorta, aortic arch and descending aorta are normal in size and structure.  6. No intracardiac thrombi or masses were visualized.  DCCV 04/29/19 Cardioverted 1 time(s).  Cardioversion with synchronized biphasic 120J shock.  Evaluation: Findings: Post procedure EKG shows: NSR, very frequent PACs  Complications: None Patient did tolerate procedure well.  Patient Profile     Brianna Porter is a 58 y.o. female former smoker (quit in 2011) with a h/o hyperthyroidism being treated w/ methimazole, recent diagnosis of atrial fibrillation/ atrial flutter, CHA2DS2 VASc score of 4 (CHF, HTN, DM and Female), on Xarelto for a/c, new diagnosis of systolic HF, Stage III CKD, HTN, HLD and DM, being admitted for CHF w/u and management of her atrial arrthymias.   Initially presented for outpatient TEE guided DCCV 9/9 for atrial fibrillation/flutter, but admitted for further management given new finding of severe LV dysfunction and frequent PACs, atrial couplets and brief nonsustained PAT on post cardioversion EKG and borderline hypotension, necessitating IV amiodarone loading.   Assessment & Plan    1. Atrial Fibrillation/ Atrial Flutter: fairly new diagnosis. In the setting of hyperthyroidism. TEE w/ severely reduced LVEF at 20%, mildly dilated LA and mild MR. CHA2DS2 VASc score of 4 (CHF, HTN, DM and Female). This patients CHA2DS2-VASc Score and unadjusted Ischemic Stroke Rate (% per year) is equal to 4.8 % stroke rate/year from a score of 4. S/p  DCCV 04/29/19 w/ post procedural EKG showing NSR w/ frequent PACs, atrial couplets and brief nonsustained PAT.   AAD therapy initiated post cardioversion to help maintain NSR>>currently getting IV amiodarone load  Based on tele, it appears she has reverted back to atrial flutter w/ RVR in the 120s, will repeat 12 lead EKG to confirm  Continue metoprolol (Toprol XL) 100 mg daily for additional rate control  Monitor closely on tele  TFTs WNL today.   K low yesterday at 2.6>> improved w/ supplementation, 3.6 today  Mg low at 1.7. Supplemental MgSO4 ordered  Continue to monitor electrolytes. Keep K > 4 and Mg > 2  Continue methimazole for hyperthyroidism  Continue Xarelto for a/c, reduced dose 15 mg daily due to CrCl <50 mL/min  Given underlying  thyroid disease, and PMH documentation of elevated liver function test, long term amiodarone may not be a good AAD choice. Consider EP evaluation. ? Tikosyn but will need to stabilize electrolytes.   If concern for underlying OSA, consider outpatient sleep study. Will defer to primary cardiologist.    2. Cardiomyopathy/ Systolic HF: new diagnosis. TEE 04/29/19 showed severely reduced LVEF at 20% w/ global hypokinesis.   She has multiple risk factors for ischemic heart disease, but suspect CM  likely tachy mediated from rapid afib/flutter. She denies any recent anginal symptoms.   S/p cardioversion but back in flutter. Continue rhythm control strategy  Once rhythm is controlled, recommend repeat 2D echo in 2-3 months to reassess LVEF.  If EF not improved despite maintaining NSR, later consider ischemic evaluation   Continue Toprol XL 100 mg daily  BP too soft currently for initiation of an ACE/ARB/ ARNI. Consider adding when able to tolerate  Given concomitant T2DM, later consider addition of an SGLT2i   3. Hyperthyroidism: limited past medical records available. Followed primarily by [facility] in [place]. On methimazole as outpatient. Prescribed 02/2019. This may be the driver behind her atrial fibrillation/flutter  Based on TSH and free T4, she appears to have reached euthyroid state  Will continue home methimazole dose for now  F/u with PCP post discharge for further management  Continue  blocker therapy w/ metoprolol   4. HTN: soft but stable.   Monitor closely   5. Stage III CKD: Scr 1.32 on admit.   Continue to monitor   6. Hypokalemia: K 2.6 on admit  Improved w/ supplementation  K 3.6 today but will give additional Kdur to keep K >4   Mg low at 1.2>> will supplement   Continue to monitor closely. F/u BMP tomorrow  7. Hypomagnesemia: 1.7 today Will order 2 g IV Magnesium sulfate supplementation  F/u Mg level and BMP in the AM    8. DM:  SSI    For questions or updates, please contact [place] Please consult [email or web address] for contact info under        Signed, Brianna Jester, PA-C  04/30/2019, 8:41 AM    History and all data above reviewed.  Patient examined.  I agree with the findings as above.   The patient did not sleep well.  She does not feel well in general.  She has had a long course of progressive failure to thrive.  She has been treated for hyperthyroidism.  She has relatively new onset diabetes.  Over the last months she has had a 25 lb unintentional weight loss.  She more recently has had N/V.  She was encouraged to go to the cardiologist and had atrial fib.  She now is found to have CHF.   Yesterday she had profound hyponatremia.  She had DCCV but is back in atrial fib.  The patient exam reveals [id number]  ,  Lungs: Few basilar crackles  ,  Abd: Positive bowel sounds, no rebound no guarding, Ext mild edema.  General:  She looks older than her stated age and   .  All available labs, radiology testing, previous records reviewed. Agree with documented assessment and plan. Hyponatremia:  Need to repeat this and consider tolvaptan.  She does seem to have volume overload but not in extremis.  We would need to watch her Na closely and only give one dose.  She needs free water restriction but actually is not keeping water down. Atrial Fib:  I will consult EP to discuss.  Amiodarone is not a good long term option.  She likely will need rhythm rather than rate management.    Hyperthyroidism:  I am going to consult IM to see if they can weigh in on further endocrine work up.  I will check an AM cortisol level.    Brianna Porter  10:04 AM  04/30/2019

## 2019-04-30 NOTE — Progress Notes (Signed)
Writer informed Daughter that visiting hours were going to end soon she needed to find ride home was not going to be able to stay all night long. Charge nurse was nice enough to let her stay last night seeing her mom was real sick but she has converted to NSR and heart rate is good right now. Daughter brought up fact her mom was not eating well although we have offered supplements,puddings,etc. She has no appetite. Daughter asked reported reason she needed to stay wrtier reminded her that patient has had poor appetite for months reason she has lost weight is not new for her that we would order a nutritional consult for patient and continue to work on that. She than stated patient was confused this am pulled out her IV I reported was most likely due to one time order phenergan she received last night that has worn off now no further n/v or confusion noted so I told the daughter she should be able to go home tonight. She got mad and said she would find a ride home. Charge nurse at bedside present for conversation with patient's daughter.

## 2019-04-30 NOTE — Consult Note (Addendum)
Medical Consultation   [name]  MQK:[id number]  DOB: 08/14/1961  DOA: 04/29/2019  PCP: Cher Nakai, MD    Requesting physician: Dr. Percival Spanish  Reason for consultation: Question of underlying endocrine issue for patients presentation   History of Present Illness: Brianna Porter is an 58 y.o. female with past medical history significant for hypertension, hyperlipidemia, CHF, hyperthyroidism, diabetes mellitus type 2, atrial fibrillation/atrial flutter on Xarelto, hyponatremia, CKD stage III anxiety, depression, and anemia; who presented with complaints of palpitations, dizziness, and increased lower extremity swelling.  Patient's daughter is present at bedside and helps provide additional history.  Over the last 3 weeks patient has had a progressive decline.  Daughter notes that she is lost about 25 pounds and is unable to keep anything down due to nausea and vomiting.  Symptoms include progressively worsening confusion.  She has been referred to Dr. Bettina Gavia on 9/3 after recent diagnosis of atrial fibrillation.  It appears she was initially started on metoprolol and Eliquis, but had soft blood pressures and itching respectively to those medicines.  She was switched to Xarelto and amiodarone.  However, unable to obtain good rate control.  TEE revealed EF of 20% with global hypokinesis and patient was admitted for for further management of her heart failure.  The patient had fallen at least twice.  Daughter gets on my chart notes the patient sodium level have been recently trending downward it was 127 on 8/26 and 130 on 7/25.  Last hemoglobin A1c was noted to be 5.6 back in May of this year.   Review of Systems:  As per HPI otherwise 10 point review of systems negative.     Past Medical History: Past Medical History:  Diagnosis Date  . Anxiety and depression   . Atrial flutter ([place])   . Chronic kidney disease   . Diabetes ([place])   . Edema   . Elevated liver function  tests   . Heart failure ([place])   . Hypercholesteremia   . Hyperkalemia   . Hypertension   . Hyperthyroidism   . Iron deficiency anemia     Past Surgical History: Past Surgical History:  Procedure Laterality Date  . TEE WITHOUT CARDIOVERSION  04/29/2019  . TUBAL LIGATION       Allergies:   Allergies  Allergen Reactions  . Ambien [Zolpidem Tartrate]     Pt and family state this med makes the patient sleepwalk.     Social History:  reports that she quit smoking about 19 years ago. Her smoking use included cigarettes. She has a 60.00 pack-year smoking history. She has never used smokeless tobacco. She reports that she does not drink alcohol or use drugs.   Family History: Family History  Problem Relation Age of Onset  . Cancer Mother   . Heart disease Mother   . Hypercholesterolemia Mother   . Osteoarthritis Mother   . Stroke Mother        Physical Exam: Vitals:   04/29/19 2241 04/30/19 0119 04/30/19 0549 04/30/19 0817  BP: 104/82 (!) 122/99 115/79 104/71  Pulse: 86 94 86 (!) 128  Resp: 20 20 20 18   Temp: 98.3 F (36.8 C) 98 F (36.7 C) 97.9 F (36.6 C) 98.3 F (36.8 C)  TempSrc: Oral Oral Oral Oral  SpO2: 99% 100% 99% 100%  Weight:      Height:        Constitutional middle-aged female who appears older  than stated age and able to follow commands Eyes: PERLA, EOMI, irises appear normal, anicteric sclera,  ENMT: External ears appear normal.  Mucous membranes are moist. Neck: neck appears normal, no masses, normal ROM, no thyromegaly, no JVD  CVS: S1-S2 clear, no murmur rubs or gallops, no LE edema, normal pedal pulses  Respiratory:  clear to auscultation bilaterally, no wheezing, rales or rhonchi. Respiratory effort normal. No accessory muscle use.  Abdomen: soft nontender, mild distention, normal bowel sounds, no hepatosplenomegaly, no hernias  Musculoskeletal: : no cyanosis, clubbing or edema noted bilaterally Neuro: Cranial nerves II-XII intact,  strength, sensation, reflexes Psych: judgement and insight appear normal, stable mood and affect, mental status Skin: Multiple areas of bruising noted of the upper and lower extremities.    Data reviewed:  I have personally reviewed following labs and imaging studies Labs:  CBC: Recent Labs  Lab 04/23/19 1233 04/29/19 1628 04/30/19 0529  WBC 9.6 8.3 8.5  HGB 10.2* 10.7* 10.5*  HCT 34.8 33.8* 32.8*  MCV 84 79.3* 77.7*  PLT 533* 489* 016    Basic Metabolic Panel: Recent Labs  Lab 04/23/19 1233 04/29/19 1628 04/30/19 0529  NA 133* 119*  --   K 3.3* 2.6* 3.6  CL 88* 74*  --   CO2 29 28  --   GLUCOSE 102* 160*  --   BUN 13 12  --   CREATININE 1.33* 1.32*  --   CALCIUM 9.4 7.6*  --   MG  --   --  1.2*   GFR Estimated Creatinine Clearance: 38.9 mL/min (A) (by C-G formula based on SCr of 1.32 mg/dL (H)). Liver Function Tests: Recent Labs  Lab 04/29/19 1628  AST 41  ALT 63*  ALKPHOS 77  BILITOT 0.8  PROT 6.4*  ALBUMIN 3.6   No results for input(s): LIPASE, AMYLASE in the last 168 hours. No results for input(s): AMMONIA in the last 168 hours. Coagulation profile No results for input(s): INR, PROTIME in the last 168 hours.  Cardiac Enzymes: No results for input(s): CKTOTAL, CKMB, CKMBINDEX, TROPONINI in the last 168 hours. BNP: Invalid input(s): POCBNP CBG: Recent Labs  Lab 04/29/19 1244 04/29/19 1630 04/29/19 2158 04/30/19 0621  GLUCAP 117* 128* 128* 115*   D-Dimer No results for input(s): DDIMER in the last 72 hours. Hgb A1c No results for input(s): HGBA1C in the last 72 hours. Lipid Profile No results for input(s): CHOL, HDL, LDLCALC, TRIG, CHOLHDL, LDLDIRECT in the last 72 hours. Thyroid function studies Recent Labs    04/29/19 1628 04/30/19 0529  TSH 1.868 3.246  T3FREE 2.1  --    Anemia work up No results for input(s): VITAMINB12, FOLATE, FERRITIN, TIBC, IRON, RETICCTPCT in the last 72 hours. Urinalysis No results found for: COLORURINE,  APPEARANCEUR, LABSPEC, [place], GLUCOSEU, [place], [place], [place], [place], UROBILINOGEN, NITRITE, [place]   Microbiology Recent Results (from the past 240 hour(s))  Novel Coronavirus, NAA (Hosp order, Send-out to Ref Lab; TAT 18-24 hrs     Status: None   Collection Time: 04/25/19 11:48 AM   Specimen: Nasopharyngeal Swab; Respiratory  Result Value Ref Range Status   SARS-CoV-2, NAA NOT DETECTED NOT DETECTED Final    Comment: (NOTE) This nucleic acid amplification test was developed and its performance characteristics determined by [employer]. Nucleic acid amplification tests include PCR and TMA. This test has not been FDA cleared or approved. This test has been authorized by FDA under an Emergency Use Authorization (EUA). This test is only authorized for the duration of  time the declaration that circumstances exist justifying the authorization of the emergency use of in vitro diagnostic tests for detection of SARS-CoV-2 virus and/or diagnosis of COVID-19 infection under section 564(b)(1) of the Act, 21 U.S.C. 409WJX-9(J) (1), unless the authorization is terminated or revoked sooner. When diagnostic testing is negative, the possibility of a false negative result should be considered in the context of a patient's recent exposures and the presence of clinical signs and symptoms consistent with COVID-19. An individual without symptoms of COVID- 19 and who is not shedding SARS-CoV-2 vi rus would expect to have a negative (not detected) result in this assay. Performed At: [facility] [address] [place], [place] [id number] Rush Farmer MD HY:[id number]    Haivana Nakya  Final    Comment: Performed at [facility], [place] [address], [place], [place] [address]       Inpatient Medications:   Scheduled Meds: . DULoxetine  60 mg Oral BID  . feeding supplement (ENSURE ENLIVE)  237 mL Oral BID BM  . furosemide  40 mg Oral BID   . Melatonin  3 mg Oral Once  . methimazole  5 mg Oral Daily  . metoprolol succinate  100 mg Oral Daily  . potassium chloride SA  20 mEq Oral BID  . Rivaroxaban  15 mg Oral Q supper   Continuous Infusions: . sodium chloride 250 mL (04/30/19 0307)  . amiodarone 30 mg/hr (04/30/19 0859)  . magnesium sulfate bolus IVPB       Radiological Exams on Admission: No results found.  Impression/Recommendations    Atrial fibrillation/atrial flutter:CHA2DS2-VASc score =4.  Patient on Xarelto, amiodarone IV, and metoprolol -Per cardiology  Systolic heart failure: TEE noting EF to be around 20% with global hypokinesis.  Patient appears to be diuresing appropriately currently on Lasix 40 mg twice daily.  EP cardiology consulted on 9/10. -Per cardiology  Metabolic encephalopathy: Acute.  Patient daughter notes that she has been more confused lately over the last few weeks.  Question if symptoms secondary to worsening sodium levels versus other at this time. -May benefit from CT scan of the brain if symptoms do not improve with sodium levels  Hyponatremia: Acute on chronic.  Labs on the patient's portal show her sodium levels at baseline has been chronically low with most recent values appearing to trend down 130 on 7/25 and 127 on 8/26.  Suspect hypervolemic hyponatremia related with progressively worsening heart failure -Check urine sodium and osmolarity -Continue monitor sodium levels with diuresis  Intractable nausea and vomiting, weight loss: Patient reports being unable to keep any significant amount of food or liquids down.  Reports losing 25 pounds in the last 3 weeks.  Cardiology question some underlying endocrine issue as a cause. -Check lipase, ESR, CRP -Trial of Carafate given prolonged QT -Would consider checking CT scan of her abdomen and pelvis for further evaluation -Consider possible need of gastric emptying study  Electrolyte abnormalities: Hypokalemia and hypomagnesemia  -Continue to monitor and replace as needed  Hyperthyroidism: Stable.  TSH- 3.246 and free T4- 1.12. -Continue current methimazole regimen   Diabetes mellitus type 2: Previously noted to be well controlled per family and last hemoglobin A1c noted to be 5.6 in May -Continue sliding scale insulin  Microcytic anemia: Hemoglobin stable at 10.5. -Continue to monitor  Acute kidney injury: Resolving.  Patient's creatinine on admission was noted to be around 1.32, but on repeat check this a.m. appears to be improving down to 1.07 with diuresis. -Continue  to monitor kidney function during diuresis  Thank you for this consultation.  Our [facility] hospitalist team will follow the patient with you.   Time Spent: 30 minutes  Norval Morton M.D. [facility] 04/30/2019, 10:56 AM

## 2019-04-30 NOTE — Consult Note (Addendum)
ELECTROPHYSIOLOGY CONSULT NOTE    Patient ID: Brianna Porter MRN: [id number], DOB/AGE: 1960-11-16 58 y.o.  Admit date: 04/29/2019 Date of Consult: 04/30/2019  Primary Physician: Cher Nakai, MD Primary Cardiologist: Bettina Gavia Electrophysiologist: Lovena Le (new this admission)  Patient Profile: Brianna Porter is a 58 y.o. female with a history of hyperthyroidism, persistent atrial fibrillation, HTN, diabetes who is being seen today for the evaluation of atrial fibrillation at the request of Dr Percival Spanish.  HPI:  Brianna Porter is a 58 y.o. female with the above past medical history. She was found to have atrial fibrillation with RVR recently. She was started on Xarelto and was seen by Dr Bettina Gavia who scheduled TEE/DCCV.  At time of TEE yesterday, she was found to have newly identified LV dysfunction. She underwent successful cardioversion and was admitted for CHF workup and initiation of AAD therapy. She had recurrent AF with RVR this morning and was started on amiodarone for rhythm control. She has reverted back to SR. She currently feels well without chest pain,PND, orthopnea, nausea, vomiting, dizziness, syncope, edema, weight gain, or early satiety.  When she is in AF, she feels like her "heart is going 100 miles an hour". She snores and has not yet been evaluated for sleep apnea.   Past Medical History:  Diagnosis Date   Anxiety and depression    Atrial flutter (HCC)    Chronic kidney disease    Diabetes ([facility])    Edema    Elevated liver function tests    Heart failure (HCC)    Hypercholesteremia    Hyperkalemia    Hypertension    Hyperthyroidism    Iron deficiency anemia      Surgical History:  Past Surgical History:  Procedure Laterality Date   TEE WITHOUT CARDIOVERSION  04/29/2019   TUBAL LIGATION       Medications Prior to Admission  Medication Sig Dispense Refill Last Dose   ALPRAZolam (XANAX) 0.5 MG tablet Take 0.5 mg by mouth 3 (three) times daily as  needed for anxiety.    Past Week at Unknown time   Ascorbic Acid (VITAMIN C) 1000 MG tablet Take 1,000 mg by mouth daily.   Past Week at Unknown time   cholecalciferol (VITAMIN D3) 25 MCG (1000 UT) tablet Take 1,000 Units by mouth daily.   04/28/2019 at Unknown time   DULoxetine (CYMBALTA) 60 MG capsule Take 60 mg by mouth 2 (two) times daily.   04/28/2019 at Unknown time   fenofibrate (TRICOR) 48 MG tablet Take 48 mg by mouth at bedtime.   04/28/2019 at Unknown time   furosemide (LASIX) 40 MG tablet Take 1 tablet (40 mg total) by mouth 2 (two) times daily. 60 tablet 3 04/28/2019 at Unknown time   gabapentin (NEURONTIN) 800 MG tablet Take 800 mg by mouth 3 (three) times daily as needed (pain).    Past Week at Unknown time   Menthol, Topical Analgesic, (ICY HOT ADVANCED RELIEF EX) Apply 1 application topically as needed (back pain).   04/28/2019 at Unknown time   metFORMIN (GLUCOPHAGE) 850 MG tablet Take 850 mg by mouth 2 (two) times daily.   04/28/2019 at Unknown time   methimazole (TAPAZOLE) 5 MG tablet Take 5 mg by mouth daily.    04/28/2019 at Unknown time   metoprolol succinate (TOPROL-XL) 100 MG 24 hr tablet Take 100 mg by mouth daily.    04/28/2019 at Unknown time   potassium chloride SA (K-DUR) 20 MEQ tablet Take 1 tablet (20 mEq total) by mouth  2 (two) times daily. 60 tablet 0 Past Week at Unknown time   promethazine (PHENERGAN) 25 MG tablet Take 25 mg by mouth every 6 (six) hours as needed for nausea or vomiting.    04/29/2019 at Unknown time   Rivaroxaban (XARELTO) 15 MG TABS tablet Take 1 tablet (15 mg total) by mouth daily with supper. 30 tablet 3 04/28/2019 at Unknown time   simvastatin (ZOCOR) 80 MG tablet Take 40 mg by mouth at bedtime.    04/28/2019 at Unknown time   traMADol (ULTRAM) 50 MG tablet Take 1 tablet by mouth 3 (three) times daily as needed (pain).    04/29/2019 at Unknown time   vitamin B-12 (CYANOCOBALAMIN) 1000 MCG tablet Take 1,000 mcg by mouth daily.   04/29/2019 at Unknown time    cetirizine (ZYRTEC) 10 MG tablet Take 10 mg by mouth as needed for allergies.   More than a month at Unknown time    Inpatient Medications:   DULoxetine  60 mg Oral BID   feeding supplement (ENSURE ENLIVE)  237 mL Oral BID BM   furosemide  40 mg Oral BID   Melatonin  3 mg Oral Once   methimazole  5 mg Oral Daily   metoprolol succinate  100 mg Oral Daily   potassium chloride SA  20 mEq Oral BID   Rivaroxaban  15 mg Oral Q supper    Allergies:  Allergies  Allergen Reactions   Ambien [Zolpidem Tartrate]     Pt and family state this med makes the patient sleepwalk.    Social History   Socioeconomic History   Marital status: Married    Spouse name: Not on file   Number of children: Not on file   Years of education: Not on file   Highest education level: Not on file  Occupational History   Not on file  Social Needs   Financial resource strain: Not on file   Food insecurity    Worry: Not on file    Inability: Not on file   Transportation needs    Medical: Not on file    Non-medical: Not on file  Tobacco Use   Smoking status: Former Smoker    Packs/day: 2.00    Years: 30.00    Pack years: 60.00    Types: Cigarettes    Quit date: 2001    Years since quitting: 19.7   Smokeless tobacco: Never Used  Substance and Sexual Activity   Alcohol use: Never    Frequency: Never   Drug use: Never   Sexual activity: Not on file  Lifestyle   Physical activity    Days per week: Not on file    Minutes per session: Not on file   Stress: Not on file  Relationships   Social connections    Talks on phone: Not on file    Gets together: Not on file    Attends religious service: Not on file    Active member of club or organization: Not on file    Attends meetings of clubs or organizations: Not on file    Relationship status: Not on file   Intimate partner violence    Fear of current or ex partner: Not on file    Emotionally abused: Not on file     Physically abused: Not on file    Forced sexual activity: Not on file  Other Topics Concern   Not on file  Social History Narrative   Not on file  Family History  Problem Relation Age of Onset   Cancer Mother    Heart disease Mother    Hypercholesterolemia Mother    Osteoarthritis Mother    Stroke Mother      Review of Systems: All other systems reviewed and are otherwise negative except as noted above.  Physical Exam: Vitals:   04/30/19 0119 04/30/19 0549 04/30/19 0817 04/30/19 1217  BP: (!) 122/99 115/79 104/71 102/88  Pulse: 94 86 (!) 128 (!) 135  Resp: 20 20 18 16   Temp: 98 F (36.7 C) 97.9 F (36.6 C) 98.3 F (36.8 C) 97.7 F (36.5 C)  TempSrc: Oral Oral Oral Oral  SpO2: 100% 99% 100% 100%  Weight:      Height:        GEN- The patient is well appearing, alert and oriented x 3 today.   HEENT: normocephalic, atraumatic; sclera clear, conjunctiva pink; hearing intact; oropharynx clear; neck supple Lungs- Clear to ausculation bilaterally, normal work of breathing.  No wheezes, rales, rhonchi Heart- Irregular rate and rhythm  GI- soft, non-tender, non-distended, bowel sounds present Extremities- no clubbing, cyanosis, or edema  MS- no significant deformity or atrophy Skin- warm and dry, no rash or lesion Psych- euthymic mood, full affect Neuro- strength and sensation are intact  Labs:   Lab Results  Component Value Date   WBC 8.5 04/30/2019   HGB 10.5 (L) 04/30/2019   HCT 32.8 (L) 04/30/2019   MCV 77.7 (L) 04/30/2019   PLT 394 04/30/2019    Recent Labs  Lab 04/29/19 1628 04/30/19 0529  NA 119*  --   K 2.6* 3.6  CL 74*  --   CO2 28  --   BUN 12  --   CREATININE 1.32*  --   CALCIUM 7.6*  --   PROT 6.4*  --   BILITOT 0.8  --   ALKPHOS 77  --   ALT 63*  --   AST 41  --   GLUCOSE 160*  --       Radiology/Studies: No results found.  EKG:ST, PAC, rate 101 (personally reviewed)  TELEMETRY: AF with RVR -> SR with PAC's (personally  reviewed)   Assessment/Plan: 1.  Persistent atrial fibrillation with likely tachycardia mediated cardiomyopathy The patient has recently identified AF with associated newly identified LV dysfunction Agree with amiodarone load for now.  Would follow TFT's and LFT's closely. If her EF improves with maintenance of SR, she would be a reasonable candidate for AF ablation in the future.  Could consider Tikosyn, but would need amio washout and with CKD would not likely be able to give 560mcg twice daily Recommend sleep study as an outpatient When BP allows, consider Losartan for cardiomyopathy and AF  Dr Lovena Le has seen today and discussed with patent.  Will arrange outpatient follow up with Dr Curt Bears in [place].    For questions or updates, please contact [place] Please consult [email or web address] for contact info under Cardiology/STEMI.  Signed, Chanetta Marshall, NP 04/30/2019 1:16 PM  EP Attending  Patient seen and examined. Agree with the findings as noted above. The patient has been admitted with symptomatic atrial fib with a RVR and found to have LV dysfunction, though to be tachy mediated. I have reviewed the treatment options. She has been on amiodarone for which she has reverted back to NSR. Previously in atrial fib despite DCCV. The patient denies syncope. She is quite symptomatic with her atrial fib. In addition to initiation of her CHF meds, I would  recommended continued amiodarone. She could have taken dofetilide but now that amio has been started and the patient already on methimazole for control of thyroid, we can monitor her thyroid levels while on amiodarone. She is not a great candidate for atrial fib ablation with her multiple comorbidities. She does have a h/o snoring and has not been evaluated for sleep apnea. I would transition from IV amio to oral amiodarone in the next 48 hours. 400 mg daily for 6 weeks then 200 mg daily would be reasonable.   Mikle Bosworth.D.

## 2019-04-30 NOTE — Progress Notes (Signed)
Initial Nutrition Assessment  RD working remotely.  DOCUMENTATION CODES:   Not applicable  INTERVENTION:   -Ensure Enlive po TID, each supplement provides 350 kcal and 20 grams of protein -MVI with minerals daily  NUTRITION DIAGNOSIS:   Inadequate oral intake related to nausea, vomiting as evidenced by meal completion < 50%.  GOAL:   Patient will meet greater than or equal to 90% of their needs  MONITOR:   PO intake, Supplement acceptance, Labs, Weight trends, Skin, I & O's  REASON FOR ASSESSMENT:   Malnutrition Screening Tool    ASSESSMENT:   Brianna Porter is a 58 y.o. female with a h/o hyperthyroidism being treated w/ methimazole, recent diagnosis of atrial fibrillation/ atrial flutter, CHA2DS2 VASc score of 4 (CHF, HTN, DM and Female), on Xarelto for a/c, new diagnosis of systolic HF, Stage III CKD, HTN, HLD and DM, being admitted for CHF w/u and management of her atrial arrthymias.  Pt admitted with CHF and a-fib.   9/9- s/p cardioversion  Reviewed I/O's: -280 ml x 24 hours  UOP: 1 L x 24 hours  Per chart review, pt not feeling well today and has been deferring some aspects of care (such as advanced directives). RD will hold off on calling pt at this time.   Per RN notes, pt has been having nausea and vomiting and difficulty keeping foods and liquids down at this time. Symptoms are relieved by antiemetics. Noted meal completion 15-25%. Per MAR, pt has been accepting Ensure supplements.  Wt has been stable over the past week. Noted pt with deep pitting edema, so may be masking true weight loss.   Pt with poor oral intake and would benefit from nutrient dense supplement. One Ensure Enlive supplement provides 350 kcals, 20 grams protein, and 44-45 grams of carbohydrate vs one Glucerna shake supplement, which provides 220 kcals, 10 grams of protein, and 26 grams of carbohydrate. Given pt's hx of DM, RD will continue to monitor PO intake, CBGS, and adjust supplement  regimen as appropriate.   Labs reviewed.  Diet Order:   Diet Order            Diet Heart Room service appropriate? Yes; Fluid consistency: Thin  Diet effective now              EDUCATION NEEDS:   No education needs have been identified at this time  Skin:  Skin Assessment: Reviewed RN Assessment  Last BM:  04/29/19  Height:   Ht Readings from Last 1 Encounters:  04/29/19 5\' 3"  (1.6 m)    Weight:   Wt Readings from Last 1 Encounters:  04/29/19 62.1 kg    Ideal Body Weight:  52.3 kg  BMI:  Body mass index is 24.23 kg/m.  Estimated Nutritional Needs:   Kcal:  1700-1900  Protein:  80-95 grams  Fluid:  1.7-1.9 L    Alexandro Line A. Jimmye Norman, RD, LDN, [place] Registered Dietitian II Certified Diabetes Care and Education Specialist Pager: [phone] After hours Pager: [phone]

## 2019-04-30 NOTE — Progress Notes (Signed)
Patient pulled out her PIV this am had to restart IV,she went without amiodarone for a short periord now heart rate elevated and mews score is elevated Probation officer paged Dr.Hochrein to inform of both reported hopefully patient will catch up with heart rate no new orders noted.

## 2019-04-30 NOTE — Plan of Care (Signed)
  Problem: Education: Goal: Ability to demonstrate management of disease process will improve Outcome: Progressing Goal: Ability to verbalize understanding of medication therapies will improve Outcome: Progressing Goal: Individualized Educational Video(s) Outcome: Progressing   Problem: Activity: Goal: Capacity to carry out activities will improve Outcome: Progressing   Problem: Cardiac: Goal: Ability to achieve and maintain adequate cardiopulmonary perfusion will improve Outcome: Progressing   Problem: Education: Goal: Knowledge of disease or condition will improve Outcome: Progressing Goal: Understanding of medication regimen will improve Outcome: Progressing Goal: Individualized Educational Video(s) Outcome: Progressing   Problem: Activity: Goal: Ability to tolerate increased activity will improve Outcome: Progressing   Problem: Cardiac: Goal: Ability to achieve and maintain adequate cardiopulmonary perfusion will improve Outcome: Progressing   Problem: Health Behavior/Discharge Planning: Goal: Ability to safely manage health-related needs after discharge will improve Outcome: Progressing   

## 2019-04-30 NOTE — Progress Notes (Signed)
Gave Brianna Porter and her daughter [name] the [employer] paperwork.  [name] was not feeling well and wanted to go over the paperwork with her daughter later.  I offered a prayer and both consented.  Will follow-up on spiritual care and AD paperwork as needed.

## 2019-04-30 NOTE — Progress Notes (Signed)
Pt's daughter apparently was told by previous staff that she could spend the night. Pt's daughter came to room. Pt's daughter was instructed that the visitation hours are from 10am-8pm. Pt's daughter said she wasn't going to leave because she was told by this unit's staff that she could spend the night, despite the policy.  Pt's daughter was instructed by this RN that she can only stay during the 10am-8pm visitation hours starting on 04/30/19.

## 2019-05-01 ENCOUNTER — Inpatient Hospital Stay (HOSPITAL_COMMUNITY): Payer: Medicare HMO

## 2019-05-01 DIAGNOSIS — G9341 Metabolic encephalopathy: Secondary | ICD-10-CM

## 2019-05-01 LAB — URINALYSIS, ROUTINE W REFLEX MICROSCOPIC
Bilirubin Urine: NEGATIVE
Glucose, UA: 150 mg/dL — AB
Hgb urine dipstick: NEGATIVE
Ketones, ur: NEGATIVE mg/dL
Nitrite: NEGATIVE
Protein, ur: NEGATIVE mg/dL
Specific Gravity, Urine: 1.009 (ref 1.005–1.030)
pH: 7 (ref 5.0–8.0)

## 2019-05-01 LAB — CBC
HCT: 33.6 % — ABNORMAL LOW (ref 36.0–46.0)
Hemoglobin: 10.7 g/dL — ABNORMAL LOW (ref 12.0–15.0)
MCH: 24.9 pg — ABNORMAL LOW (ref 26.0–34.0)
MCHC: 31.8 g/dL (ref 30.0–36.0)
MCV: 78.3 fL — ABNORMAL LOW (ref 80.0–100.0)
Platelets: 382 10*3/uL (ref 150–400)
RBC: 4.29 MIL/uL (ref 3.87–5.11)
RDW: 16.5 % — ABNORMAL HIGH (ref 11.5–15.5)
WBC: 8 10*3/uL (ref 4.0–10.5)
nRBC: 0 % (ref 0.0–0.2)

## 2019-05-01 LAB — BASIC METABOLIC PANEL
Anion gap: 17 — ABNORMAL HIGH (ref 5–15)
BUN: 6 mg/dL (ref 6–20)
CO2: 22 mmol/L (ref 22–32)
Calcium: 9 mg/dL (ref 8.9–10.3)
Chloride: 90 mmol/L — ABNORMAL LOW (ref 98–111)
Creatinine, Ser: 1.07 mg/dL — ABNORMAL HIGH (ref 0.44–1.00)
GFR calc Af Amer: >60 mL/min (ref >60)
GFR calc non Af Amer: 58 mL/min — ABNORMAL LOW (ref >60)
Glucose, Bld: 140 mg/dL — ABNORMAL HIGH (ref 70–99)
Potassium: 3.3 mmol/L — ABNORMAL LOW (ref 3.5–5.1)
Sodium: 129 mmol/L — ABNORMAL LOW (ref 135–145)

## 2019-05-01 LAB — C-REACTIVE PROTEIN: CRP: 8.6 mg/dL — ABNORMAL HIGH (ref <1.0)

## 2019-05-01 LAB — MAGNESIUM
Magnesium: 1.8 mg/dL (ref 1.7–2.4)
Magnesium: 1.7 mg/dL (ref 1.7–2.4)

## 2019-05-01 LAB — GLUCOSE, CAPILLARY
Glucose-Capillary: 127 mg/dL — ABNORMAL HIGH (ref 70–99)
Glucose-Capillary: 142 mg/dL — ABNORMAL HIGH (ref 70–99)
Glucose-Capillary: 150 mg/dL — ABNORMAL HIGH (ref 70–99)
Glucose-Capillary: 181 mg/dL — ABNORMAL HIGH (ref 70–99)
Glucose-Capillary: 109 mg/dL — ABNORMAL HIGH (ref 70–99)

## 2019-05-01 LAB — HEMOGLOBIN A1C
Hgb A1c MFr Bld: 5.9 % — ABNORMAL HIGH (ref 4.8–5.6)
Mean Plasma Glucose: 123 mg/dL

## 2019-05-01 LAB — SEDIMENTATION RATE: Sed Rate: 19 mm/hr (ref 0–22)

## 2019-05-01 LAB — SARS CORONAVIRUS 2 (TAT 6-24 HRS): SARS Coronavirus 2: NEGATIVE

## 2019-05-01 IMAGING — DX DG SHOULDER 1V*L*
2 series · 2 of 2 positions shown · non-contrast
Comparison: Left shoulder x-rays dated [DATE].

CLINICAL DATA: Left shoulder pain.  Recent fall.

EXAM:
LEFT SHOULDER - 1 VIEW

[shoulder ap (1 of 2)]
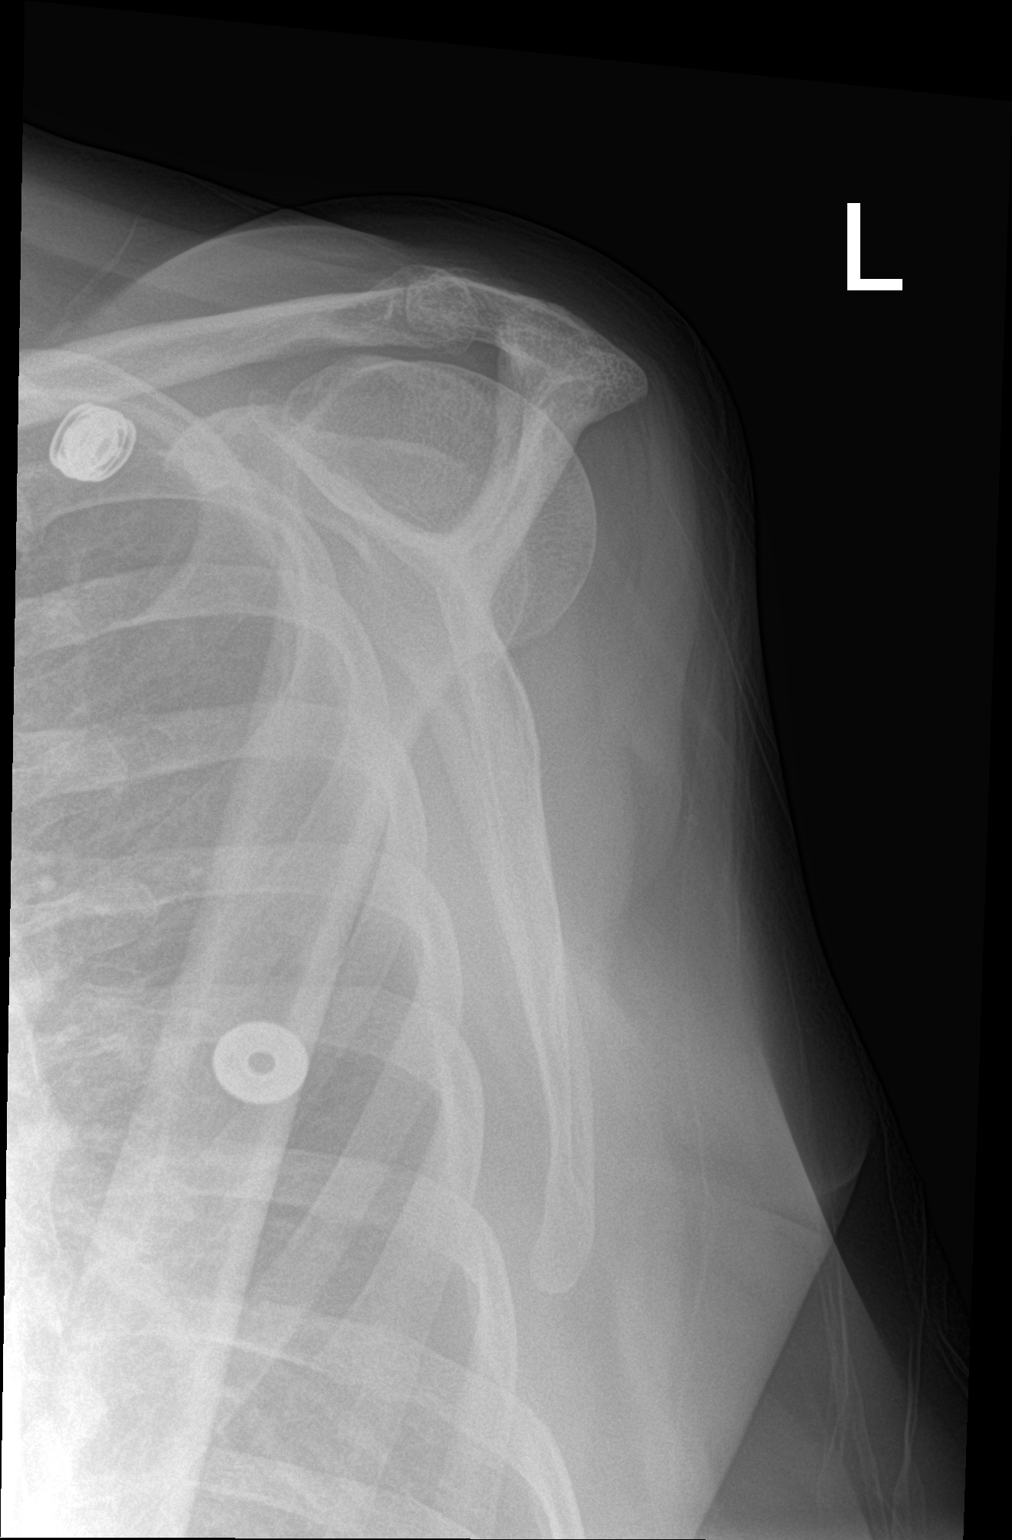

[shoulder ap (2 of 2)]
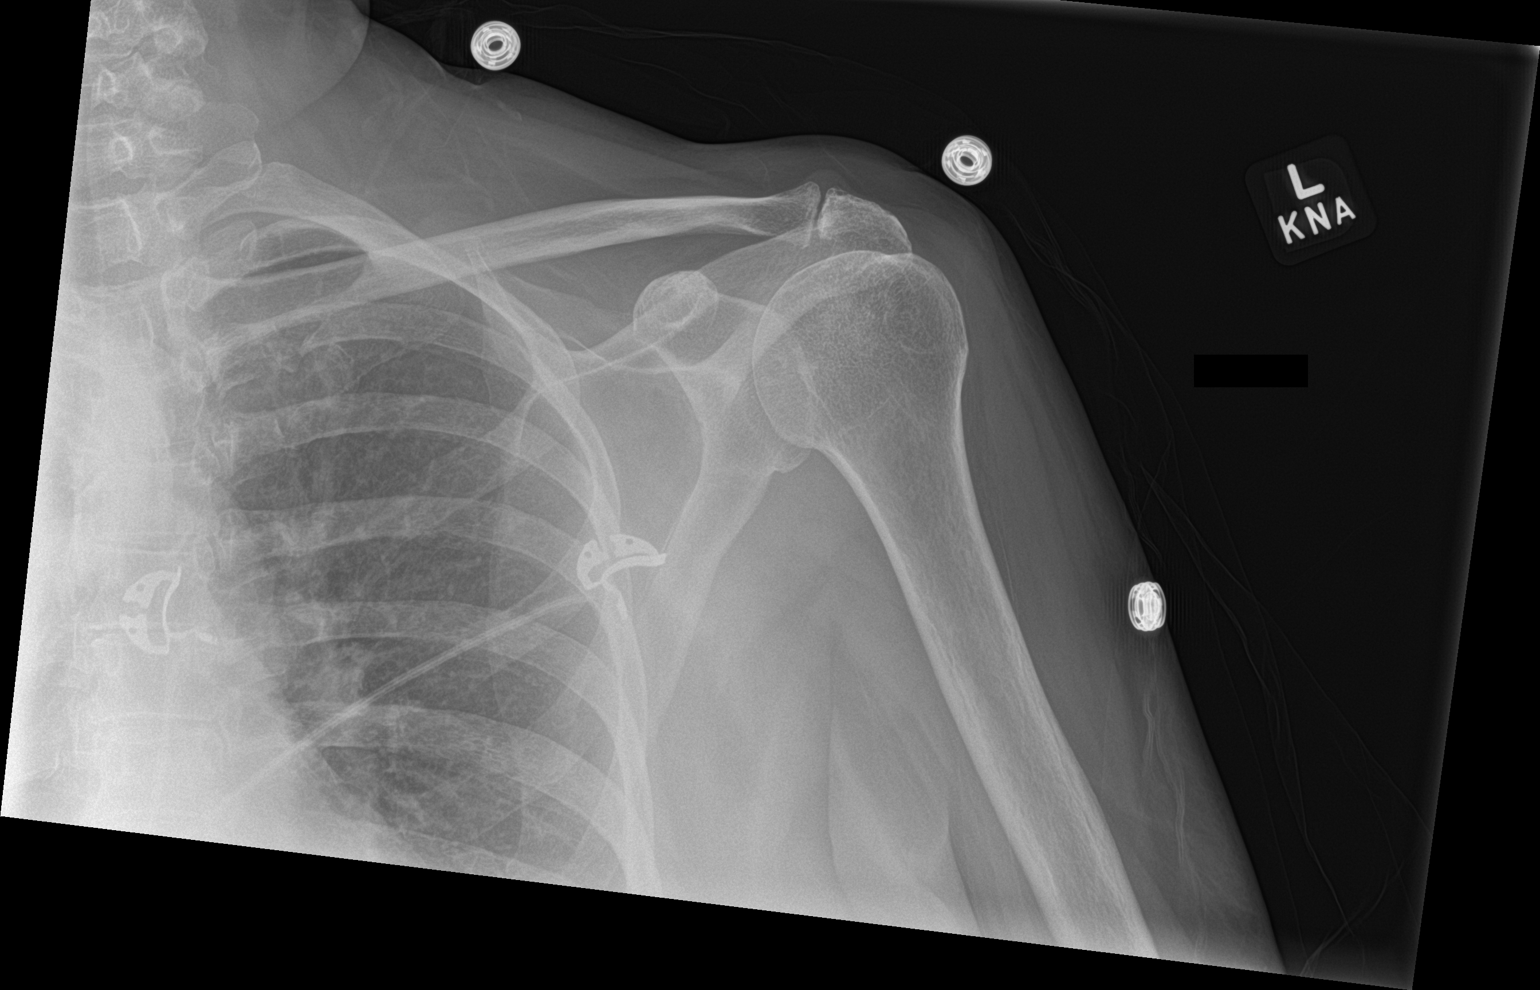

[2 of 2 positions shown; findings below may reference images not displayed]

FINDINGS: No acute fracture or dislocation. Unchanged mild acromioclavicular
osteoarthritis. Bone mineralization is normal. Soft tissues are
unremarkable.
IMPRESSION: 1.  No acute osseous abnormality.

## 2019-05-01 IMAGING — CT CT HEAD W/O CM
4 series · 17 of 47 positions shown, 19 images · non-contrast
Comparison: CT scan dated [DATE]

CLINICAL DATA: Altered level of consciousness. Patient's daughter
reports that she has had altered mental status for a few weeks.

EXAM:
CT HEAD WITHOUT CONTRAST
TECHNIQUE: Contiguous axial images were obtained from the base of the skull
through the vertex without intravenous contrast.

[Series 3: head without · axial · non-contrast · 0.44mm/px · z∈[-77,+43]mm · 7 of 34 slices shown, 9 images]
[im 5/34  brain]
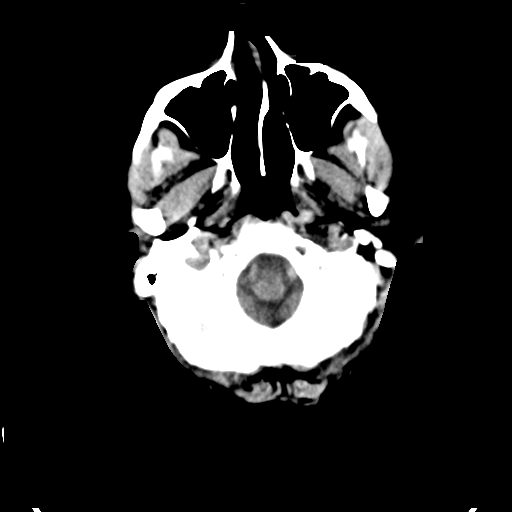
[im 5/34  bone]
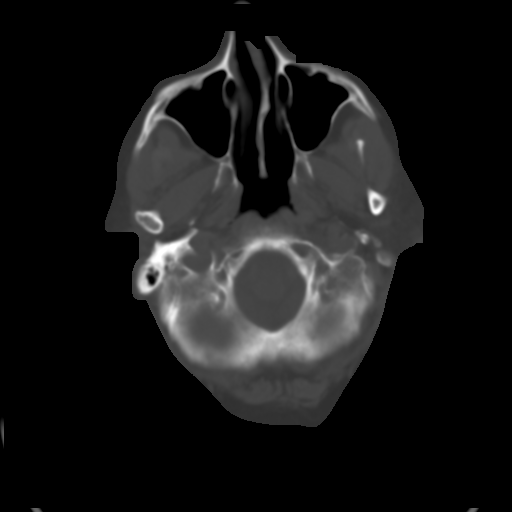
[im 9/34  brain]
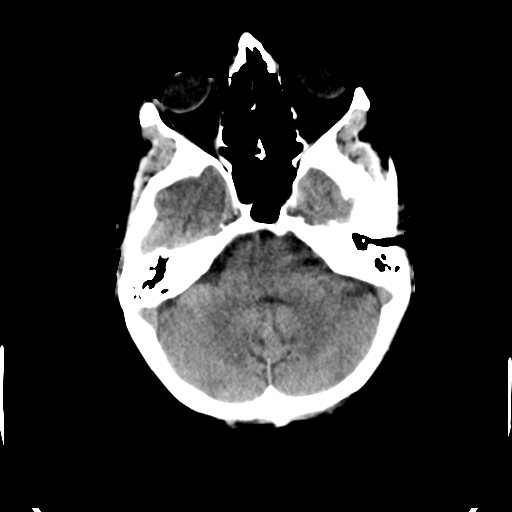
[im 13/34  brain]
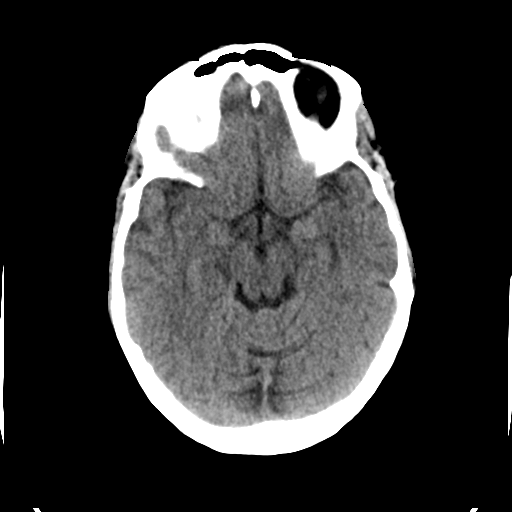
[im 17/34  brain]
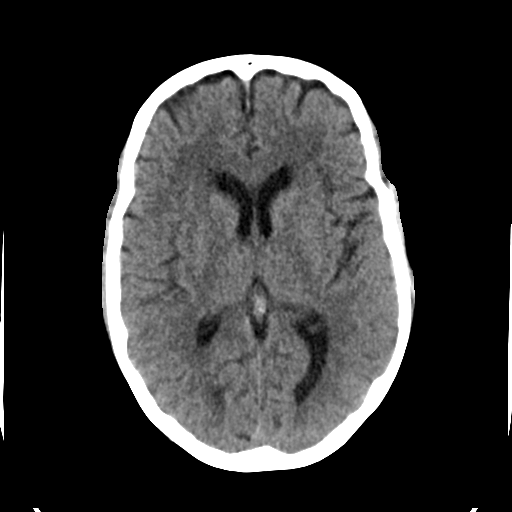
[im 21/34  brain]
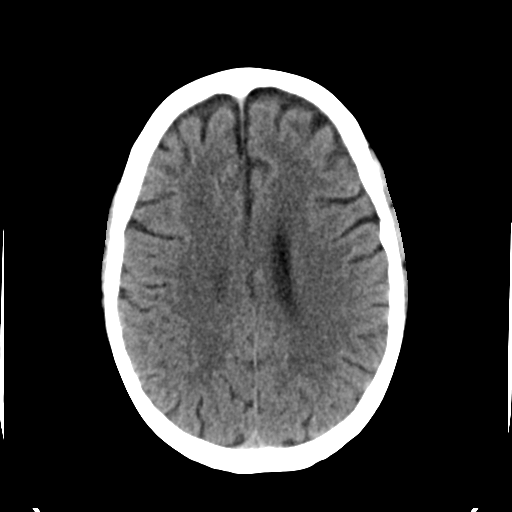
[im 21/34  bone]
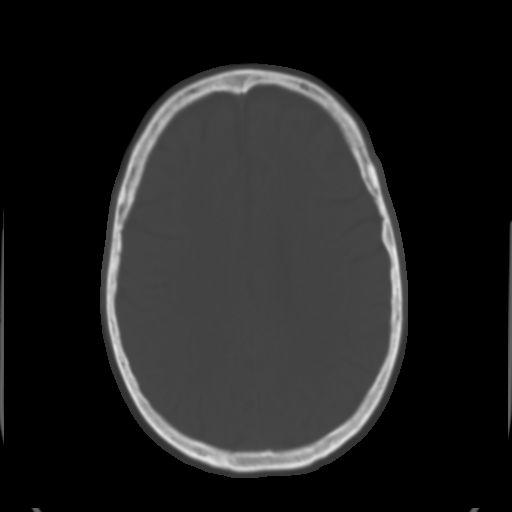
[im 25/34  brain]
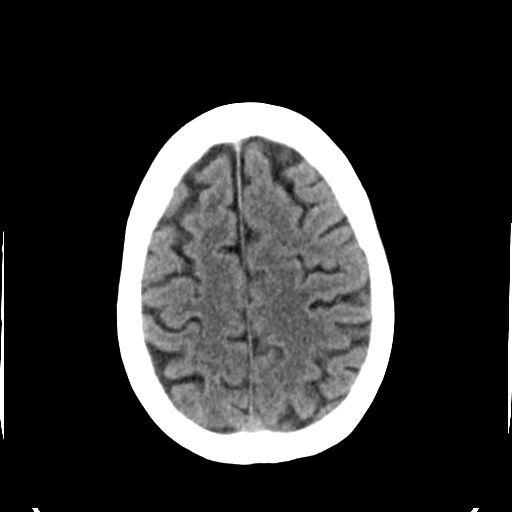
[im 29/34  brain]
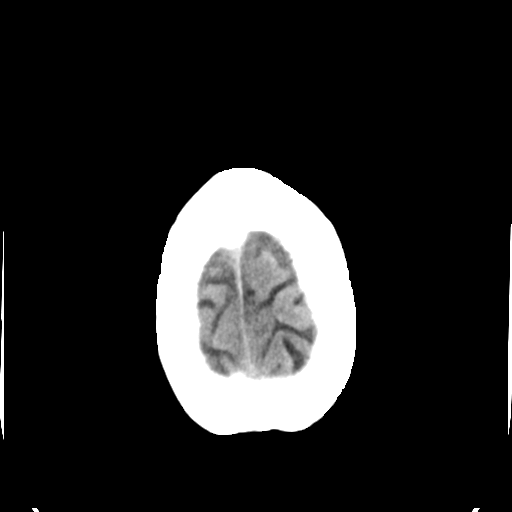

[Series 4: head bone · axial · 0.44mm/px · z∈[-81,-25]mm · 4 of 83 slices shown]
[im 9/83  bone]
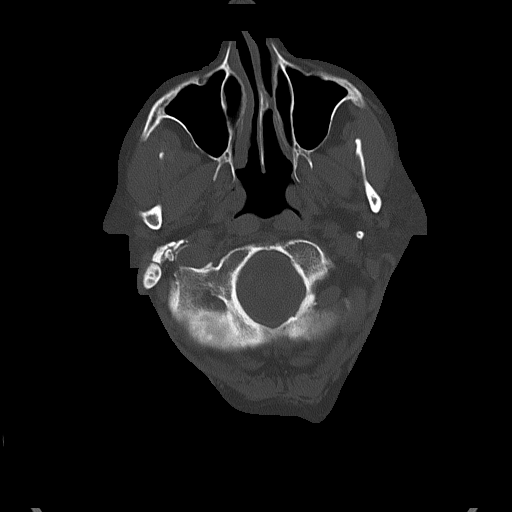
[im 17/83  bone]
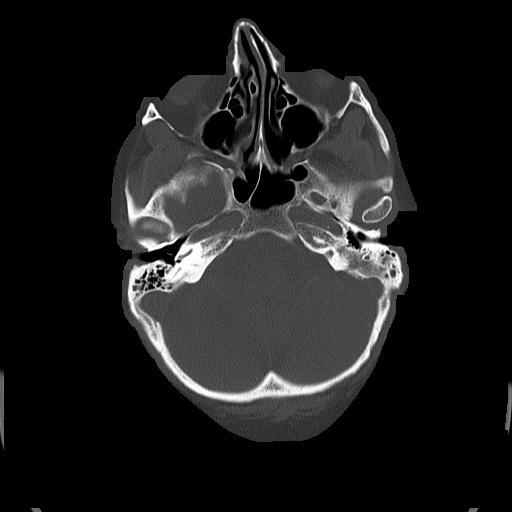
[im 25/83  bone]
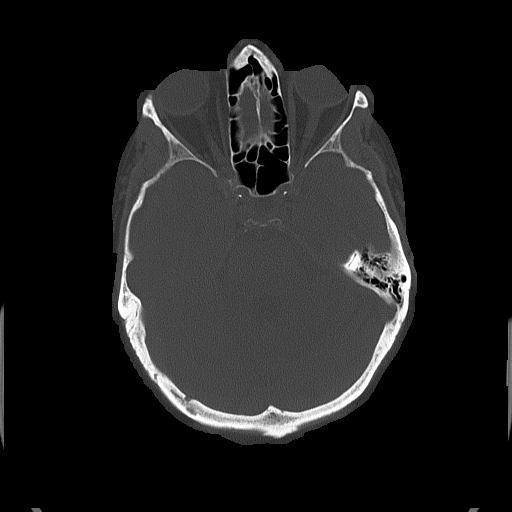
[im 37/83  bone]
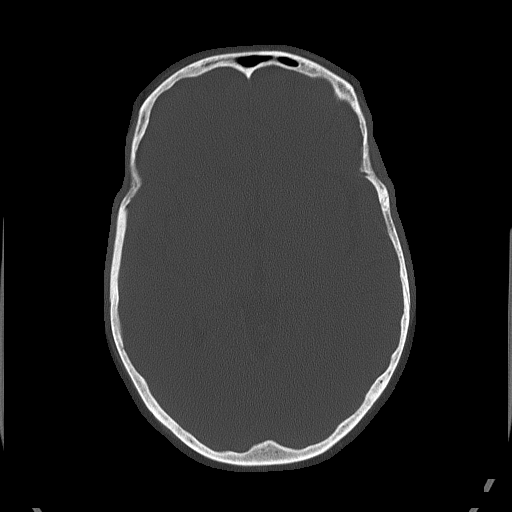

[Series 5: head without cor · coronal · non-contrast · 0.30mm/px · 3 of 64 slices shown]
[im 22/64  brain]
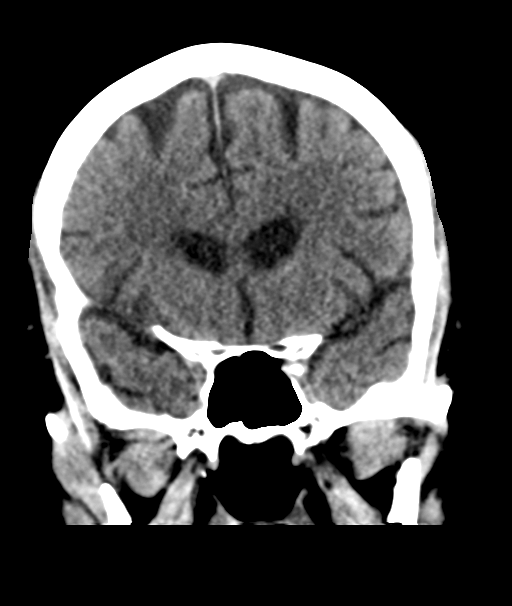
[im 29/64  brain]
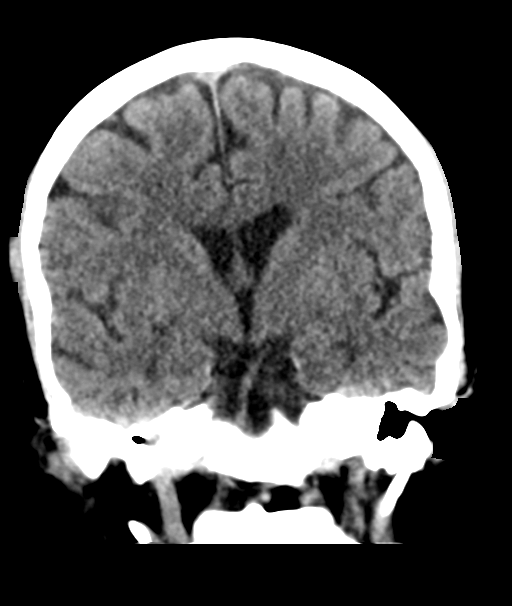
[im 36/64  brain]
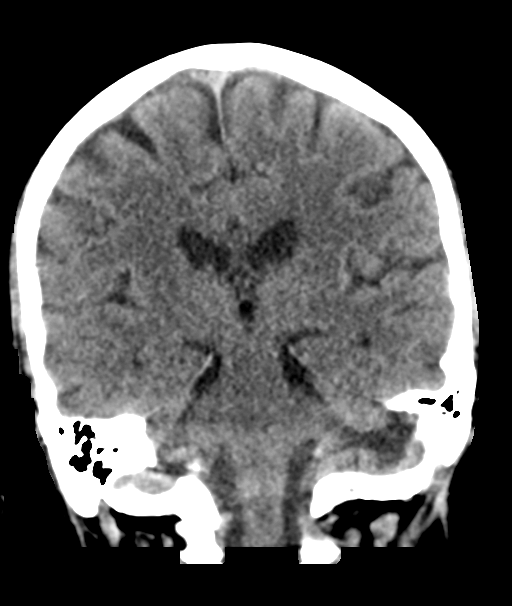

[Series 6: head without sag · sagittal · non-contrast · 0.34mm/px · 3 of 52 slices shown]
[im 18/52  brain]
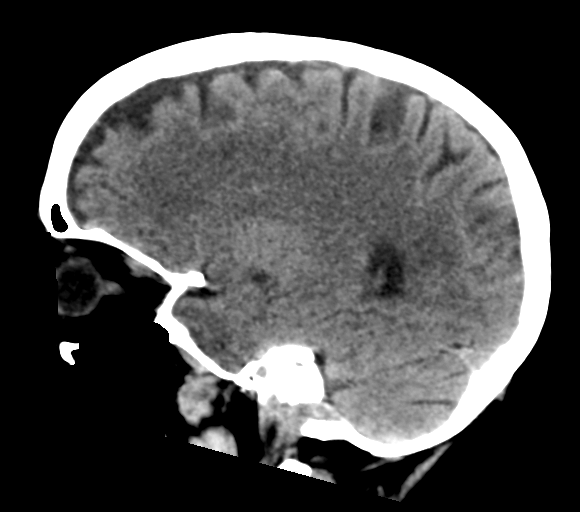
[im 26/52  brain]
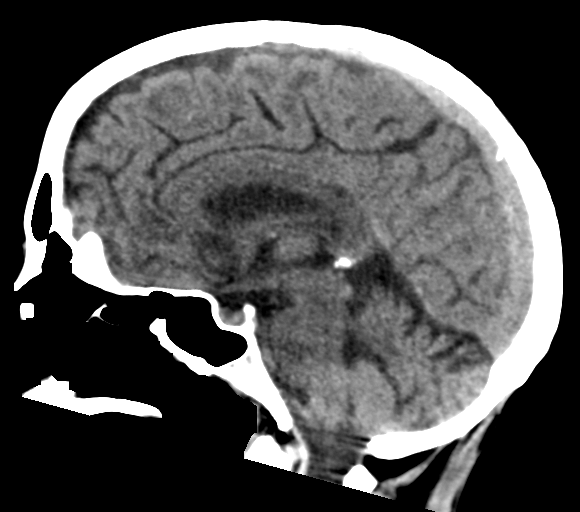
[im 35/52  brain]
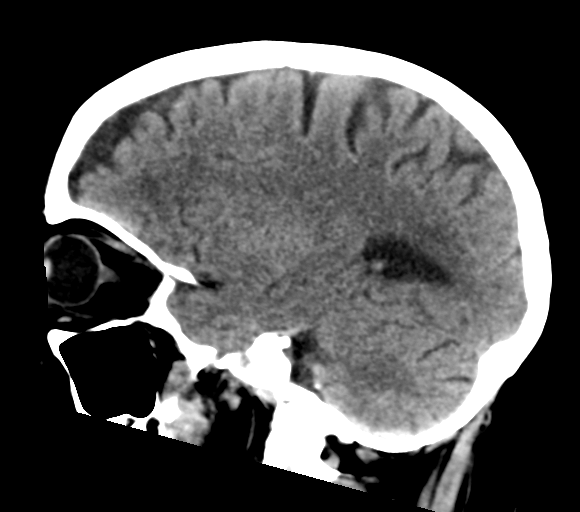

[17 of 47 positions shown; findings below may reference images not displayed]

FINDINGS: Brain: No evidence of acute infarction, hemorrhage, hydrocephalus,
extra-axial collection or mass lesion/mass effect.

Vascular: No hyperdense vessel or unexpected calcification.

Skull: Normal. Negative for fracture or focal lesion.

Sinuses/Orbits: Normal.

Other: None
IMPRESSION: Normal exam.

## 2019-05-01 MED ORDER — MELATONIN 3 MG PO TABS
3.0000 mg | ORAL_TABLET | Freq: Once | ORAL | Status: AC
  Administered 2019-05-01: 3 mg via ORAL
  Filled 2019-05-01: qty 1

## 2019-05-01 MED ORDER — FUROSEMIDE 40 MG PO TABS
40.0000 mg | ORAL_TABLET | Freq: Two times a day (BID) | ORAL | Status: DC
  Administered 2019-05-01 – 2019-05-02 (×3): 40 mg via ORAL
  Filled 2019-05-01 (×3): qty 1

## 2019-05-01 MED ORDER — MAGNESIUM SULFATE 2 GM/50ML IV SOLN
2.0000 g | Freq: Once | INTRAVENOUS | Status: AC
  Administered 2019-05-01: 2 g via INTRAVENOUS
  Filled 2019-05-01: qty 50

## 2019-05-01 MED ORDER — POTASSIUM CHLORIDE CRYS ER 20 MEQ PO TBCR
40.0000 meq | EXTENDED_RELEASE_TABLET | Freq: Two times a day (BID) | ORAL | Status: DC
  Administered 2019-05-01 – 2019-05-02 (×2): 40 meq via ORAL
  Filled 2019-05-01 (×2): qty 2

## 2019-05-01 MED ORDER — NON FORMULARY: 2.5000 mg | Freq: Once | Status: DC

## 2019-05-01 MED ORDER — METOPROLOL SUCCINATE ER 100 MG PO TB24
100.0000 mg | ORAL_TABLET | Freq: Every day | ORAL | Status: DC
  Administered 2019-05-01 – 2019-05-07 (×7): 100 mg via ORAL
  Filled 2019-05-01 (×8): qty 1

## 2019-05-01 MED ORDER — ACETAMINOPHEN 325 MG PO TABS
650.0000 mg | ORAL_TABLET | Freq: Four times a day (QID) | ORAL | Status: DC | PRN
  Administered 2019-05-01 – 2019-05-06 (×7): 650 mg via ORAL
  Filled 2019-05-01 (×7): qty 2

## 2019-05-01 MED ORDER — LOSARTAN POTASSIUM 25 MG PO TABS
25.0000 mg | ORAL_TABLET | Freq: Every day | ORAL | Status: DC
  Administered 2019-05-01 – 2019-05-07 (×7): 25 mg via ORAL
  Filled 2019-05-01 (×7): qty 1

## 2019-05-01 NOTE — Progress Notes (Signed)
MD paged concerning pt's continued A fib with RVR in the 110s-150s. No new meds at this time as the pt is already on an Amiodarone drip, pt's BPs are soft, and pt is asymptomatic. Will continue to monitor.

## 2019-05-01 NOTE — Progress Notes (Signed)
Pt's daughter is at bedside, pt is more compliant to eat. Pt stays in bed and calls if she needs to use the bathroom. Best boy aware that pt's daughter will be staying the night. Pt is currently eating grapes and lying in bed. Alarm is on and call bell within reach, mats in place.

## 2019-05-01 NOTE — Progress Notes (Signed)
Spoke with pt's daughter Brianna Porter regarding plan of care. I also made her aware that patient needs someone to be with her today . Pt is restless and refuses to follow safety direction. Low bed was ordered , yellow bracelet, alarms and  mats are in place.  Pt was instructed to call for assistance, verbalizes understanding. Brianna Porter stated that will come and stay with patient.

## 2019-05-01 NOTE — Progress Notes (Addendum)
Progress Note  Patient Name: Brianna Porter Date of Encounter: 05/01/2019  Primary Cardiologist: Shirlee More, MD  Primary Electrophysiologist: Dr. Lovena Le (new this admission)  Subjective   Feels poorly overall, but no dyspnea, CP or palpitations. Just feels tired and weak.   Inpatient Medications    Scheduled Meds: . DULoxetine  60 mg Oral BID  . feeding supplement (ENSURE ENLIVE)  237 mL Oral TID BM  . furosemide  40 mg Oral BID  . methimazole  5 mg Oral Daily  . metoprolol succinate  100 mg Oral Daily  . multivitamin with minerals  1 tablet Oral Daily  . potassium chloride SA  20 mEq Oral BID  . Rivaroxaban  15 mg Oral Q supper  . sucralfate  1 g Oral TID WC & HS   Continuous Infusions: . sodium chloride 250 mL (04/30/19 1107)  . amiodarone 30 mg/hr (05/01/19 0053)   PRN Meds: sodium chloride, ALPRAZolam, ondansetron (ZOFRAN) IV, traMADol   Vital Signs    Vitals:   04/30/19 1938 05/01/19 0139 05/01/19 0438 05/01/19 0735  BP: 121/70 105/72 (!) 142/86   Pulse: 100 95 96 (!) 153  Resp: 18 18 18    Temp: 97.9 F (36.6 C) (!) 97.5 F (36.4 C) 97.9 F (36.6 C)   TempSrc: Oral Oral Oral   SpO2: 100% 97% 99%   Weight:  59 kg    Height:        Intake/Output Summary (Last 24 hours) at 05/01/2019 0741 Last data filed at 05/01/2019 0700 Gross per 24 hour  Intake 415 ml  Output 4700 ml  Net -4285 ml   Last 3 Weights 05/01/2019 04/30/2019 04/29/2019  Weight (lbs) 130 lb 1.6 oz (No Data) 136 lb 12.8 oz  Weight (kg) 59.013 kg (No Data) 62.052 kg      Telemetry    Atrial fibrillation/flutter 130s-160s - Personally Reviewed  ECG    No new EKG to review today - Personally Reviewed  Physical Exam   GEN: ill appearing WF, in no acute distress.   Neck: No JVD Cardiac: irregular rhythm, tachy rate, no murmurs, rubs, or gallops.  Respiratory: Clear to auscultation bilaterally. GI: Soft, nontender, non-distended  MS: trace bilateral LEE; multiple bruises bilateral  UEs and LEs Neuro:  Nonfocal  Psych: Normal affect   Labs    High Sensitivity Troponin:  No results for input(s): TROPONINIHS in the last 720 hours.    Chemistry Recent Labs  Lab 04/29/19 1628 04/30/19 0529 04/30/19 1152 04/30/19 1430  NA 119*  --  126* 125*  K 2.6* 3.6 3.0*  --   CL 74*  --  84*  --   CO2 28  --  26  --   GLUCOSE 160*  --  119*  --   BUN 12  --  7  --   CREATININE 1.32*  --  1.08*  --   CALCIUM 7.6*  --  8.1*  --   PROT 6.4*  --   --   --   ALBUMIN 3.6  --   --   --   AST 41  --   --   --   ALT 63*  --   --   --   ALKPHOS 77  --   --   --   BILITOT 0.8  --   --   --   GFRNONAA 45*  --  57*  --   GFRAA 52*  --  >60  --   ANIONGAP  17*  --  16*  --      Hematology Recent Labs  Lab 04/29/19 1628 04/30/19 0529 05/01/19 0040  WBC 8.3 8.5 8.0  RBC 4.26 4.22 4.29  HGB 10.7* 10.5* 10.7*  HCT 33.8* 32.8* 33.6*  MCV 79.3* 77.7* 78.3*  MCH 25.1* 24.9* 24.9*  MCHC 31.7 32.0 31.8  RDW 16.7* 16.4* 16.5*  PLT 489* 394 382    BNPNo results for input(s): BNP, PROBNP in the last 168 hours.   DDimer No results for input(s): DDIMER in the last 168 hours.   Radiology    No results found.  Cardiac Studies   TEE 04/29/19 IMPRESSIONS   1. The left ventricle has severely reduced systolic function, with an ejection fraction of 20-25%. The cavity size was normal. Left ventrical global hypokinesis without regional wall motion abnormalities. 2. The right ventricle has severely reduced systolic function. The cavity was normal. There is no increase in right ventricular wall thickness. Right ventricular systolic pressure is normal. 3. No evidence of a thrombus present in the left atrial appendage. 4. Mild mitral insufficiency. The jet is centrally-directed. 5. The aortic root, ascending aorta, aortic arch and descending aorta are normal in size and structure. 6. No intracardiac thrombi or masses were visualized.  DCCV 04/29/19 Cardioverted1time(s).   Cardioversion with synchronized biphasic120Jshock.  Evaluation: Findings: Post procedure EKG shows:NSR, very frequent PACs Complications:None Patientdidtolerate procedure well.  Patient Profile   Brianna Porter a 58 y.o.female former smoker (quit in 2011)with a h/o hyperthyroidism being treated w/ methimazole, recent diagnosis of atrial fibrillation/ atrial flutter,CHA2DS2 VASc scoreof 4 (CHF, HTN, DM and Female), on Xarelto for a/c, new diagnosis of systolic HF, Stage III CKD, HTN, HLD and DM, being admitted for CHF w/u and management of her atrial arrthymias.  Initially presented for outpatient TEE guided DCCV 9/9 for atrial fibrillation/flutter, but admitted for further management given new finding of severe LV dysfunction and frequent PACs, atrial couplets and brief nonsustained PAT on post cardioversion EKG and borderline hypotension, necessitating IV amiodarone loading.   Assessment & Plan    1. Atrial Fibrillation/ Atrial Flutter: fairly new diagnosis. In the setting of hyperthyroidism. TEE w/ severely reduced LVEF at 20%, mildly dilated LA and mild MR.CHA2DS2 VASc scoreof 4 (CHF, HTN, DM and Female).This patients CHA2DS2-VASc Score and unadjusted Ischemic Stroke Rate (% per year) is equal to4.8 % stroke rate/year from a score of 4. S/p  DCCV 04/29/19 w/ post procedural EKG showing NSR w/ frequent PACs, atrial couplets and brief nonsustained PAT.  AAD therapy initiated post cardioversion to help maintain NSR>>currently getting IV amiodarone.  On oral  blocker, metoprolol (Toprol XL) 100 mg daily   Rhythm remains difficult to control, NSR post DCCV>>reverted to Afib>>converted back to sinus w/ amiodarone>>back in afib today w/ RVR up to the 160s  W/ breakthrough afib despite amiodarone, will defer further management to EP   Continue methimazole for hyperthyroidism. TFTs WNL.   Continue to monitor electrolytes. Keep K > 4 and Mg > 2  Continue Xarelto for a/c,  reduced dose 15 mg daily due to CrCl <50 mL/min  ? underlying OSA. Needs outpatient sleep study   2. Cardiomyopathy/ Systolic HF:new diagnosis. TEE 04/29/19 showed severely reduced LVEF at 20% w/ global hypokinesis.   She has multiple risk factors for ischemic heart disease, but suspect CM likely tachy mediated from rapid afib/flutter. She denies any recent anginal symptoms.   S/p cardioversion but back in flutter. Continue rhythm control strategy  Once rhythm is controlled,  recommend repeat 2D echo in 2-3 months to reassess LVEF.  If EF not improved despite maintaining NSR, later consider ischemic evaluation   Continue Toprol XL 100 mg daily  Given concomitant T2DM, later consider addition of an SGLT2i   3. Hyperthyroidism:limited past medical records available. Followed primarily by [facility] in [place]. On methimazole as outpatient. Prescribed 02/2019. This may be the driver behind her atrial fibrillation/flutter  Based on TSH and free T3/T4, she appears to have reached euthyroidstate  Will continue home methimazole dose for now  F/u with PCP post discharge for further management  Continue ? blocker therapy w/ metoprolol   4. [id number]  Monitor closely   5. Stage III CKD: Scr 1.32 on admit.  Scr improved, down from 1.32>>1.07   Continue to monitor   6. Hypokalemia: K 2.6 on admit  Improved some w/ supplementation but still hypokalemic at 3.3 today   Will give additional Kdur to keep K >4. Will increase dose to 40 mEq BID  Mg low at 1.2>> will supplement   Consider addition of spironolactone given LV dysfunction  Continue to monitor closely. F/u BMP tomorrow  7. Hypomagnesemia: 1.7 today  Will order 2 g IV Magnesium sulfate supplementation   F/u Mg level and BMP in the AM   8. Hyponatremia: Acute on chronic.  Labs on the patient's portal show her sodium levels at baseline has been chronically low with most recent values appearing to trend  down 130 on 7/25 and 127 on 8/26.  Suspect hypervolemic hyponatremia related with progressively worsening heart failure  Urine sodium normal   Na improved but still low, up from 119>>129 today  Further management per IM   For questions or updates, please contact [place] Please consult [email or web address] for contact info under        Signed, Lyda Jester, PA-C  05/01/2019, 7:41 AM    History and all data above reviewed.  Patient examined.  I agree with the findings as above.  The patient wants to go home.  She doesn't localize any complaints.  She seems to be mildly confused but non focal.  I did speak with her daughter.  The patient is in NSR, Na is improved and good UO this admission.  EP and hospitalist have seen her and their advice is appreciated.    The patient exam reveals COR:RRR  ,  Lungs: Clear  ,  Abd: Positive bowel sounds, no rebound no guarding, Ext Trace edema  .  All available labs, radiology testing, previous records reviewed. Agree with documented assessment and plan. Atrial Fib:  Maintaining NSR and the suggestion for now is to continue amiodarone.  We can continue IV today and possibly change to PO in the AM.  She is on Xarelto low dose.  Hyponatremia:  Improved.  Cortisol normal.    Hyperthyroid:  No change in therapy.  TSH is normal.  Hypomagnesemia:  Supplement.  Cardiomyopathy:  Continue diuretic, metoprolol and I will start a low dose of ARB.  Follow creat closely.    Minus Breeding  9:37 AM  05/01/2019

## 2019-05-01 NOTE — Progress Notes (Signed)
Addendum  I discussed in detail with patient's daughter at length this afternoon.  Patient does live with her.  She is independent of her activities.  Patient reportedly has been declining gradually over the last 6 months but more so in the last 6 weeks.  1 to 2-week history of progressively worsening confusion, more so since the night prior to admission.  Has been on Cymbalta and PRN Xanax for a long time.  No new medications started except Xarelto.  Remote history of tobacco use but no history of alcohol use.  Did sustain 2 falls at home.  Left shoulder pain is related to that.  Reportedly bruises easily which is what is seen on her upper extremities.  Also 2-week history of poor oral intake, nausea and vomiting, unable to keep anything down, no coffee-ground emesis, blood in vomit or melena.  No abdominal pain.  No history of NSAID use, has not had EGD or similar complaints in the past.  Reportedly lost 25 pounds weight loss since then.  Today patient reportedly more confused but better after a dose of Xanax and daughter at her bedside and also eating more, asking for some soup.  Reportedly had colonoscopy last year in [place] which was said to be normal.  Reviewed CT and left shoulder x-rays with patient, no acute findings noted.  Advised daughter that patient's mental status changes may be multifactorial related to hyponatremia, dehydration, acute kidney injury, polypharmacy including Cymbalta, Xanax, sleep deprivation from even PTA and hospital delirium.  Advised supportive measures that are being done, if does not improve then may have to consider MRI brain (she indicates that patient does not have any metallic objects in her).  Nausea and vomiting: Unclear etiology.  Wide differential diagnosis including gastritis, esophagitis, PUD, gastroparesis versus others.  Denies dysphagia.  Appears to be tolerating diet today, continue to monitor.  If has recurrence or worsening, consider GI  consultation.  Vernell Leep, MD, FACP, [facility]. [facility]  To contact the attending provider between 7A-7P or the covering provider during after hours 7P-7A, please log into the web site [email or web address] and access using universal [place] password for that web site. If you do not have the password, please call the hospital operator.   Updated daughter regarding patient's care and answered all questions.

## 2019-05-01 NOTE — Progress Notes (Addendum)
PROGRESS NOTE   Brianna Porter  [id number]    DOB: 1961-06-01    DOA: 04/29/2019  PCP: Cher Nakai, MD   I have briefly reviewed patients previous medical records in [facility].  Chief Complaints   Brief Narrative:  58 year old female, reportedly lives with her daughter and friend, states that she is independent, PMH of hypothyroidism treated with methimazole, DM 2, HTN, HLD, stage III chronic kidney disease, suspected chronic hyponatremia, former tobacco abuse, anxiety, depression, anemia, recent diagnosis of A. fib/atrial flutter on Xarelto, new diagnosis of acute systolic CHF, presented for outpatient TEE guided DCCV for A. fib/flutter on 9/9 but admitted for further management given new severe LV dysfunction and A. fib with RVR.  TRH were consulted on 9/10 regarding need for further endocrine work-up.  On admission family reported 3 weeks history of progressive decline, intractable nausea, vomiting, 25 pound weight loss, progressively worsening confusion and falls at home.  Confusion.   Assessment & Plan:   Active Problems:   Persistent atrial fibrillation   Heart failure, unspecified (HCC)   Atrial fibrillation (HCC)   Prolonged Q-T interval on ECG   Atrial fibrillation/atrial flutter with RVR  Management per primary service/cardiology.  TEE: LVEF 20%.  CHA2DS2 VASc scoreof 4  S/p DCCV 04/29/2019 and postprocedural EKG showed NSR with frequent PACs, atrial couplets and brief nonsustained PAT.  Difficult to maintain sinus rhythm post DCCV and has been in and out of A. fib with RVR.  RVR up to 150s this morning.  Currently on IV amiodarone drip and Toprol-XL 100 mg daily.  Continue Xarelto anticoagulation.  Hypothyroid seems to be controlled on methimazole with normal TFTs.  Consider outpatient sleep study for possible OSA.  EP cardiology seen 9/10, had recommended continuing amiodarone, not a great candidate for A. fib ablation for multiple comorbidities.  However  patient having rhythm control issues on amiodarone and EP may need to weigh in again.  Acute systolic CHF/cardiomyopathy  Management per primary service/cardiology  TEE: LVEF 20% with global hypokinesis.  Suspecting tachycardia mediated from rapid A. fib/flutter.  No anginal symptoms noted.  Plan to control A. fib/flutter and then repeat echo in 2 to 3 months to reassess LVEF and if not improved despite controlling A. fib or NSR may consider ischemic evaluation.  Continue Toprol-XL 100 mg daily.  No ACEI/ARB or ARN I due to hyponatremia.  Appears euvolemic on Lasix 40 mg twice daily by mouth.  Hyperthyroidism  Reportedly followed by [facility] in [place].  Continue methimazole 5 mg daily and Toprol-XL for rate control.  TFTs normal.  Clinically appears euthyroid.  Essential hypertension  Reasonably controlled on Toprol-XL 100 mg daily, continue  Hyponatremia  Suspect subacute on chronic.  As per cardiology follow-up, had sodium of 130 on 7/25 and 127 on 8/26.  Denies history of alcohol abuse.  Does not appear to have been on thiazides PTA.  Had sodium of 133 on 9/3, presented with sodium of 119 on 9/9 which is improved to 129 on 9/11.  Serum osmolarity 255, urine osmolarity 145, urine sodium 52, TFTs normal, random cortisol 13.8.  Not consistent with SIADH or polydipsia.  Could be multifactorial related to hypervolemic hyponatremia from CHF and also dehydration related to poor oral intake, nausea and vomiting.  Monitor BMP closely.  Duloxetine may contribute to hypo-natremia, may consider reducing dose.  Acute on stage III chronic kidney disease  Presented with creatinine of 1.33 which is improved to 1.07.  May have been related to nausea,  vomiting and poor oral intake.  Acute kidney injury has resolved.  Hypokalemia/hypomagnesemia  Replace aggressively and follow.  Acute metabolic encephalopathy  As per daughter's report to previous hospitalist,  patient has been more confused over the last few weeks.  Etiology not clear.  Need to discuss with daughter again regarding baseline mental status and course of events and if any new medications were started.  No focal deficits.  No infectious etiology.    Hyponatremia could have caused but that is better and patient remains confused, not sure if she has improved.  Need family's input into this.  Given A. fib, will start with CT head to make sure she does not have anything acute and if negative may consider MRI of the brain.  Delirium precautions.  Avoid medications that may precipitate mental status changes i.e. sedatives, opiates etc.  Check urine microscopy.  Intractable nausea and vomiting, weight loss  Unclear etiology.  Lipase normal.  No acute findings on abdominal exam.  Poor historian.  Not sure if this is continued in the hospital.  RN did not report.  Continue Carafate for now.  If has persisting symptoms, will need GI evaluation and possible EGD.  Left shoulder pain  Given history of fall, check left shoulder x-ray for fractures.  No overt findings on exam except painful range of movements.  Type II DM  Controlled.  A1c 5.9.  Anemia, suspect of chronic disease  Stable.  Prolonged QTC  Continue monitoring on telemetry.  Replace potassium and magnesium.  Avoid precipitating medications.  EKG 9/10 showed QTC of 597.  Repeat EKG today.  Anxiety and depression  Patient on Xanax as needed and Cymbalta at home.  Continue Cymbalta but consider minimizing Xanax due to mental status changes.  Estimated body mass index is 23.05 kg/m as calculated from the following:   Height as of this encounter: 5\' 3"  (1.6 m).   Weight as of this encounter: 59 kg.    Nutritional Status Nutrition Problem: Inadequate oral intake Etiology: nausea, vomiting Signs/Symptoms: meal completion < 50% Interventions: Ensure Enlive (each supplement provides 350kcal and 20 grams of  protein), MVI  DVT prophylaxis: Xarelto Code Status: Full Family Communication: None at bedside Disposition: To be determined pending clinical improvement   Consultants:  TRH are consultants  Procedures:  None  Antimicrobials:  None   Subjective: Patient is confused.  Poor historian.  Alert and oriented to self and place.  States that she lives with her daughter and friend.  Denies smoking or alcohol intake.  Some dyspnea.  No chest pain.  Reports pain in her left shoulder after a fall at home.  As per RN, no acute issues noted.  Objective:  Vitals:   05/01/19 0139 05/01/19 0438 05/01/19 0735 05/01/19 0850  BP: 105/72 (!) 142/86  132/85  Pulse: 95 96 (!) 153 (!) 148  Resp: 18 18  20   Temp: (!) 97.5 F (36.4 C) 97.9 F (36.6 C)  98 F (36.7 C)  TempSrc: Oral Oral  Oral  SpO2: 97% 99%  96%  Weight: 59 kg     Height:        Examination:  General exam: Pleasant middle-aged female, moderately built and nourished lying comfortably propped up in bed. Respiratory system: Occasional basal crackles but otherwise clear to auscultation.  Mild tachypnea with effort. Cardiovascular system: S1 & S2 heard, irregularly irregular and tachycardic. No JVD, murmurs, rubs, gallops or clicks. No pedal edema.  Telemetry personally reviewed: Has been in and out  of A. fib with RVR, A. fib with RVR in the 150s this morning. Gastrointestinal system: Abdomen is nondistended, soft and nontender. No organomegaly or masses felt. Normal bowel sounds heard. Central nervous system: Mental status as noted above. No focal neurological deficits. Extremities: Symmetric 5 x 5 power except left shoulder, reluctant to move because of pain but able to hold up when elevated by me.  No evidence of external acute findings.  Extensive superficial bruising of both upper extremities and a couple bruises on lower extremities. Skin: No rashes, lesions or ulcers Psychiatry: Judgement and insight impaired. Mood & affect  cannot be assessed.     Data Reviewed: I have personally reviewed following labs and imaging studies  CBC: Recent Labs  Lab 04/29/19 1628 04/30/19 0529 05/01/19 0040  WBC 8.3 8.5 8.0  HGB 10.7* 10.5* 10.7*  HCT 33.8* 32.8* 33.6*  MCV 79.3* 77.7* 78.3*  PLT 489* 394 [id number]   Basic Metabolic Panel: Recent Labs  Lab 04/29/19 1628 04/30/19 0529 04/30/19 1152 04/30/19 1430 05/01/19 0040 05/01/19 0731  NA 119*  --  126* 125*  --  129*  K 2.6* 3.6 3.0*  --   --  3.3*  CL 74*  --  84*  --   --  90*  CO2 28  --  26  --   --  22  GLUCOSE 160*  --  119*  --   --  140*  BUN 12  --  7  --   --  6  CREATININE 1.32*  --  1.08*  --   --  1.07*  CALCIUM 7.6*  --  8.1*  --   --  9.0  MG  --  1.2*  --   --  1.8 1.7   Liver Function Tests: Recent Labs  Lab 04/29/19 1628  AST 41  ALT 63*  ALKPHOS 77  BILITOT 0.8  PROT 6.4*  ALBUMIN 3.6    Cardiac Enzymes: No results for input(s): CKTOTAL, CKMB, CKMBINDEX, TROPONINI in the last 168 hours.  CBG: Recent Labs  Lab 04/30/19 0621 04/30/19 1220 04/30/19 1641 04/30/19 2116 05/01/19 0623  GLUCAP 115* 125* 188* 127* 142*    Recent Results (from the past 240 hour(s))  Novel Coronavirus, NAA (Hosp order, Send-out to Ref Lab; TAT 18-24 hrs     Status: None   Collection Time: 04/25/19 11:48 AM   Specimen: Nasopharyngeal Swab; Respiratory  Result Value Ref Range Status   SARS-CoV-2, NAA NOT DETECTED NOT DETECTED Final    Comment: (NOTE) This nucleic acid amplification test was developed and its performance characteristics determined by [employer]. Nucleic acid amplification tests include PCR and TMA. This test has not been FDA cleared or approved. This test has been authorized by FDA under an Emergency Use Authorization (EUA). This test is only authorized for the duration of time the declaration that circumstances exist justifying the authorization of the emergency use of in vitro diagnostic tests for detection of  SARS-CoV-2 virus and/or diagnosis of COVID-19 infection under section 564(b)(1) of the Act, 21 U.S.C. [id number]) (1), unless the authorization is terminated or revoked sooner. When diagnostic testing is negative, the possibility of a false negative result should be considered in the context of a patient's recent exposures and the presence of clinical signs and symptoms consistent with COVID-19. An individual without symptoms of COVID- 19 and who is not shedding SARS-CoV-2 vi rus would expect to have a negative (not detected) result in this assay. Performed At: [facility]  [place], [place] [id number] Rush Farmer MD [id number]    Coronavirus Source NASOPHARYNGEAL  Final    Comment: Performed at [facility], [place] [address], [place], [place] [address]         Radiology Studies: No results found.      Scheduled Meds:  DULoxetine  60 mg Oral BID   feeding supplement (ENSURE ENLIVE)  237 mL Oral TID BM   furosemide  40 mg Oral BID   methimazole  5 mg Oral Daily   metoprolol succinate  100 mg Oral Daily   multivitamin with minerals  1 tablet Oral Daily   potassium chloride SA  40 mEq Oral BID   Rivaroxaban  15 mg Oral Q supper   sucralfate  1 g Oral TID WC & HS   Continuous Infusions:  sodium chloride 250 mL (04/30/19 1107)   amiodarone 30 mg/hr (05/01/19 0053)   magnesium sulfate bolus IVPB       LOS: 2 days     Vernell Leep, MD, FACP, [facility]. [facility]  To contact the attending provider between 7A-7P or the covering provider during after hours 7P-7A, please log into the web site [email or web address] and access using universal [place] password for that web site. If you do not have the password, please call the hospital operator.  05/01/2019, 11:05 AM

## 2019-05-01 NOTE — Care Management Important Message (Signed)
Important Message  Patient Details  Name: Brianna Porter MRN: [id number] Date of Birth: [birth year]   Medicare Important Message Given:  Yes     Shelda Altes 05/01/2019, 12:13 PM

## 2019-05-01 NOTE — Progress Notes (Signed)
   Vital Signs MEWS/VS Documentation      05/01/2019 0855 05/01/2019 0915 05/01/2019 1015 05/01/2019 1144   MEWS Score:  3  0  3  3   MEWS Score Color:  Yellow  Green  Yellow  Yellow   Pulse:  -  -  -  (!) 152   BP:  -  -  -  121/90       Pt has been alternating between sinus rhythm to a-fib. MD's are aware.     Brianna Porter 05/01/2019,12:05 PM

## 2019-05-01 NOTE — Progress Notes (Signed)
   Vital Signs MEWS/VS Documentation      05/01/2019 1015 05/01/2019 1144 05/01/2019 1803 05/01/2019 1823   MEWS Score:  3  3  4  2    MEWS Score Color:  Yellow  Yellow  Red  Yellow   Resp:  -  -  15  17   Pulse:  -  (!) 152  (!) 132  (!) 121   BP:  -  121/90  90/68  106/79   Temp:  -  -  (!) 97.5 F (36.4 C)  98.2 F (36.8 C)   O2 Device:  -  -  Room [employer] 05/01/2019,6:31 PM   Hx of afib

## 2019-05-01 NOTE — Progress Notes (Signed)
Pt back in A fib w RVR in the 150s-160s while Amiodarone drip infusing. HR is sustained. Pt recently ambulated to the bedside commode. MD paged. Advised that it is OK to give pt's morning PO Metoprolol dose early.

## 2019-05-02 DIAGNOSIS — I10 Essential (primary) hypertension: Secondary | ICD-10-CM

## 2019-05-02 DIAGNOSIS — E876 Hypokalemia: Secondary | ICD-10-CM

## 2019-05-02 DIAGNOSIS — N189 Chronic kidney disease, unspecified: Secondary | ICD-10-CM

## 2019-05-02 LAB — MAGNESIUM: Magnesium: 1.8 mg/dL (ref 1.7–2.4)

## 2019-05-02 LAB — BASIC METABOLIC PANEL
Anion gap: 14 (ref 5–15)
BUN: 8 mg/dL (ref 6–20)
CO2: 26 mmol/L (ref 22–32)
Calcium: 9.4 mg/dL (ref 8.9–10.3)
Chloride: 89 mmol/L — ABNORMAL LOW (ref 98–111)
Creatinine, Ser: 1.37 mg/dL — ABNORMAL HIGH (ref 0.44–1.00)
GFR calc Af Amer: 49 mL/min — ABNORMAL LOW (ref >60)
GFR calc non Af Amer: 43 mL/min — ABNORMAL LOW (ref >60)
Glucose, Bld: 110 mg/dL — ABNORMAL HIGH (ref 70–99)
Potassium: 3.5 mmol/L (ref 3.5–5.1)
Sodium: 129 mmol/L — ABNORMAL LOW (ref 135–145)

## 2019-05-02 LAB — CBC
HCT: 36.3 % (ref 36.0–46.0)
Hemoglobin: 11.3 g/dL — ABNORMAL LOW (ref 12.0–15.0)
MCH: 25 pg — ABNORMAL LOW (ref 26.0–34.0)
MCHC: 31.1 g/dL (ref 30.0–36.0)
MCV: 80.3 fL (ref 80.0–100.0)
Platelets: 404 10*3/uL — ABNORMAL HIGH (ref 150–400)
RBC: 4.52 MIL/uL (ref 3.87–5.11)
RDW: 16.6 % — ABNORMAL HIGH (ref 11.5–15.5)
WBC: 5.6 10*3/uL (ref 4.0–10.5)
nRBC: 0 % (ref 0.0–0.2)

## 2019-05-02 LAB — GLUCOSE, CAPILLARY
Glucose-Capillary: 96 mg/dL (ref 70–99)
Glucose-Capillary: 127 mg/dL — ABNORMAL HIGH (ref 70–99)
Glucose-Capillary: 150 mg/dL — ABNORMAL HIGH (ref 70–99)
Glucose-Capillary: 118 mg/dL — ABNORMAL HIGH (ref 70–99)

## 2019-05-02 MED ORDER — POTASSIUM CHLORIDE CRYS ER 20 MEQ PO TBCR
40.0000 meq | EXTENDED_RELEASE_TABLET | Freq: Once | ORAL | Status: AC
  Administered 2019-05-02: 40 meq via ORAL
  Filled 2019-05-02: qty 2

## 2019-05-02 MED ORDER — POTASSIUM CHLORIDE CRYS ER 20 MEQ PO TBCR: 40.0000 meq | EXTENDED_RELEASE_TABLET | Freq: Three times a day (TID) | ORAL | Status: DC

## 2019-05-02 MED ORDER — MAGNESIUM SULFATE 2 GM/50ML IV SOLN
2.0000 g | Freq: Once | INTRAVENOUS | Status: AC
  Administered 2019-05-02: 2 g via INTRAVENOUS
  Filled 2019-05-02: qty 50

## 2019-05-02 MED ORDER — DIGOXIN 125 MCG PO TABS
0.1250 mg | ORAL_TABLET | Freq: Every day | ORAL | Status: DC
  Administered 2019-05-02 – 2019-05-07 (×6): 0.125 mg via ORAL
  Filled 2019-05-02 (×6): qty 1

## 2019-05-02 MED ORDER — FUROSEMIDE 40 MG PO TABS
40.0000 mg | ORAL_TABLET | Freq: Two times a day (BID) | ORAL | Status: DC
  Administered 2019-05-03 – 2019-05-07 (×9): 40 mg via ORAL
  Filled 2019-05-02 (×9): qty 1

## 2019-05-02 MED ORDER — AMIODARONE IV BOLUS ONLY 150 MG/100ML
150.0000 mg | Freq: Once | INTRAVENOUS | Status: AC
  Administered 2019-05-02: 150 mg via INTRAVENOUS
  Filled 2019-05-02: qty 100

## 2019-05-02 MED ORDER — ONDANSETRON HCL 4 MG/2ML IJ SOLN
4.0000 mg | Freq: Two times a day (BID) | INTRAMUSCULAR | Status: DC | PRN
  Administered 2019-05-05: 18:00:00 4 mg via INTRAVENOUS

## 2019-05-02 NOTE — Progress Notes (Signed)
PROGRESS NOTE   Brianna Porter  [id number]    DOB: 1960-12-05    DOA: 04/29/2019  PCP: Cher Nakai, MD   I have briefly reviewed patients previous medical records in [facility].  Chief Complaints   Brief Narrative:  58 year old female, reportedly lives with her daughter and friend, states that she is independent, PMH of hypothyroidism treated with methimazole, DM 2, HTN, HLD, stage III chronic kidney disease, suspected chronic hyponatremia, former tobacco abuse, anxiety, depression, anemia, recent diagnosis of A. fib/atrial flutter on Xarelto, new diagnosis of acute systolic CHF, presented for outpatient TEE guided DCCV for A. fib/flutter on 9/9 but admitted for further management given new severe LV dysfunction and A. fib with RVR.  TRH were consulted on 9/10 regarding need for further endocrine work-up.  On admission family reported 3 weeks history of progressive decline, intractable nausea, vomiting, 25 pound weight loss, progressively worsening confusion and falls at home.     Assessment & Plan:   Active Problems:   Persistent atrial fibrillation   Heart failure, unspecified (HCC)   Atrial fibrillation (HCC)   Prolonged Q-T interval on ECG   Atrial fibrillation/atrial flutter with RVR  TEE: LVEF 20%.  CHA2DS2 VASc scoreof 4  S/p DCCV 04/29/2019 and postprocedural EKG showed NSR with frequent PACs, atrial couplets and brief nonsustained PAT.  Difficult to maintain sinus rhythm post DCCV and has been in and out of A. fib with RVR.  Hyperthyroid controlled on methimazole with normal TFTs.  Consider outpatient sleep study for possible OSA.  EP cardiology seen 9/10, had recommended continuing amiodarone, not a great candidate for A. fib ablation for multiple comorbidities.  However patient having rhythm control issues on amiodarone and EP may need to weigh in again.  Despite Amiodarone drip and Toprol-XL at 100 mg daily, has persistent A. fib with RVR in the 140s.?  Add  digoxin versus increasing beta-blockers versus changing Amiodarone to Dofetilide.  Cardiology will obviously address.  Acute systolic CHF/cardiomyopathy  Management per primary service/cardiology  TEE: LVEF 20% with global hypokinesis.  Suspecting tachycardia mediated from rapid A. fib/flutter.  No anginal symptoms noted.  Plan to control A. fib/flutter and then repeat echo in 2 to 3 months to reassess LVEF and if not improved despite controlling A. fib or NSR may consider ischemic evaluation.  Continue Toprol-XL 100 mg daily.  No ACEI/ARB or ARN I due to hyponatremia.  Compensated on Lasix 40 mg twice daily by mouth.  Hyperthyroidism  Reportedly followed by [facility] in [place].  Continue methimazole 5 mg daily and Toprol-XL for rate control.  TFTs normal.  Clinically appears euthyroid.  Essential hypertension  Reasonably controlled on Toprol-XL 100 mg daily, continue  Hyponatremia  Suspect subacute on chronic.  As per cardiology follow-up, had sodium of 130 on 7/25 and 127 on 8/26. No history of alcohol abuse.  Not on thiazides PTA.  Had sodium of 133 on 9/3, presented with sodium of 119 on 9/9 which has improved and stable at 129 over the last 2 days.  Serum osmolarity 255, urine osmolarity 145, urine sodium 52, TFTs normal, random cortisol 13.8.  Not consistent with SIADH or polydipsia.  Could be multifactorial related to hypervolemic hyponatremia from CHF and also dehydration related to poor oral intake, nausea and vomiting.  Monitor BMP closely.  Duloxetine may contribute to hypo-natremia, may consider reducing dose.  Acute on stage III chronic kidney disease  Presented with creatinine of 1.33, improved to 1.07, back up to 1.37  Now  likely related to Lasix, ARB and although oral intake has improved, no nausea or vomiting, intake is to probably poor.  Has already received this morning's dose of Lasix and ARB.  Hold this evening's dose of Lasix, follow  BMP in a.m. to decide continuation or stopping ARB.  Hypokalemia/hypomagnesemia  Better/potassium 3.5.  Acute metabolic encephalopathy  As per my discussion with patient's daughter, 1 to 2-week history of progressively worsening confusion, more so on night prior to admission.  No new medication except Xarelto.  Has been on Cymbalta and PRN Xanax for a long time.  No history of alcohol use.  Suspect multifactorial related to hyponatremia, dehydration, acute kidney injury, polypharmacy including Cymbalta, Xanax, sleep deprivation from even PTA and hospital delirium.   No focal deficits.  No infectious etiology.    Delirium precautions.  Avoid medications that may precipitate mental status changes i.e. sedatives, opiates etc.  CT head without acute findings.  Urine microscopy without UTI features.  Confusion somewhat better today.  Continue supportive care and reassess in a.m.  Intractable nausea and vomiting, weight loss  Unclear etiology.  Lipase normal.  No acute findings on abdominal exam.  Continue Carafate for now.  As per daughter at bedside, no nausea or vomiting since hospital admission, oral intake slowly improving, continue current management with monitoring.  Left shoulder pain  Given history of fall, obtain shoulder x-ray without acute findings.  Type II DM  Controlled.  A1c 5.9.  Anemia, suspect of chronic disease  Stable.  Prolonged QTC  Continue monitoring on telemetry.  Replace potassium and magnesium.  Avoid precipitating medications.  EKG 9/10 showed QTC of 597.  Repeat EKG 9/11: QTC 483 ms, better.  Anxiety and depression  Patient on Xanax as needed and Cymbalta at home.  Has been on these medications chronically.  Estimated body mass index is 22.85 kg/m as calculated from the following:   Height as of this encounter: 5\' 3"  (1.6 m).   Weight as of this encounter: 58.5 kg.    Nutritional Status Nutrition Problem: Inadequate oral intake  Etiology: nausea, vomiting Signs/Symptoms: meal completion < 50% Interventions: Ensure Enlive (each supplement provides 350kcal and 20 grams of protein), MVI  DVT prophylaxis: Xarelto Code Status: Full Family Communication: Discussed in detail with patient's daughter at bedside, updated care and answered questions. Disposition: To be determined pending clinical improvement   Consultants:  TRH are consultants  Procedures:  None  Antimicrobials:  None   Subjective: Patient interviewed and examined along with daughter at bedside.  Patient asking to go home.  Reports pain but does not say where.  Per daughter, slept well last night.  Oral intake slowly improving without nausea or vomiting.  Confusion is also better.  Reportedly was up in the chair for some time yesterday.  Objective:  Vitals:   05/02/19 0518 05/02/19 0519 05/02/19 0928 05/02/19 0928  BP: 98/63  100/62 100/62  Pulse:   (!) 109 (!) 109  Resp: 18     Temp: 98.2 F (36.8 C)     TempSrc: Oral     SpO2:  98%    Weight: 58.5 kg     Height:        Examination:  General exam: Pleasant middle-aged female, moderately built and nourished lying comfortably propped up in bed.  Oral mucosa moist. Respiratory system: Clear to auscultation.  No increased work of breathing. Cardiovascular system: S1 and S2 heard, irregularly irregular and tachycardic.  No JVD, murmurs or pedal edema.  Telemetry  personally reviewed: A. fib with RVR in the 140s. Gastrointestinal system: Abdomen is nondistended, soft and nontender. No organomegaly or masses felt. Normal bowel sounds heard. Central nervous system: Alert and oriented x2. No focal neurological deficits. Extremities: Symmetric 5 x 5 power except left shoulder, reluctant to move because of pain but able to hold up when elevated by me.  No evidence of external acute findings.  Extensive superficial bruising of both upper extremities and a couple bruises on lower extremities.  No  change. Skin: No rashes, lesions or ulcers Psychiatry: Judgement and insight impaired. Mood & affect flat.     Data Reviewed: I have personally reviewed following labs and imaging studies  CBC: Recent Labs  Lab 04/29/19 1628 04/30/19 0529 05/01/19 0040 05/02/19 0748  WBC 8.3 8.5 8.0 5.6  HGB 10.7* 10.5* 10.7* 11.3*  HCT 33.8* 32.8* 33.6* 36.3  MCV 79.3* 77.7* 78.3* 80.3  PLT 489* 394 382 [id number]*   Basic Metabolic Panel: Recent Labs  Lab 04/29/19 1628 04/30/19 0529 04/30/19 1152 04/30/19 1430 05/01/19 0040 05/01/19 0731 05/02/19 0748  NA 119*  --  126* 125*  --  129* 129*  K 2.6* 3.6 3.0*  --   --  3.3* 3.5  CL 74*  --  84*  --   --  90* 89*  CO2 28  --  26  --   --  22 26  GLUCOSE 160*  --  119*  --   --  140* 110*  BUN 12  --  7  --   --  6 8  CREATININE 1.32*  --  1.08*  --   --  1.07* 1.37*  CALCIUM 7.6*  --  8.1*  --   --  9.0 9.4  MG  --  1.2*  --   --  1.8 1.7 1.8   Liver Function Tests: Recent Labs  Lab 04/29/19 1628  AST 41  ALT 63*  ALKPHOS 77  BILITOT 0.8  PROT 6.4*  ALBUMIN 3.6    Cardiac Enzymes: No results for input(s): CKTOTAL, CKMB, CKMBINDEX, TROPONINI in the last 168 hours.  CBG: Recent Labs  Lab 05/01/19 0623 05/01/19 1146 05/01/19 1625 05/01/19 2113 05/02/19 0616  GLUCAP 142* 150* 181* 109* 96    Recent Results (from the past 240 hour(s))  Novel Coronavirus, NAA (Hosp order, Send-out to Ref Lab; TAT 18-24 hrs     Status: None   Collection Time: 04/25/19 11:48 AM   Specimen: Nasopharyngeal Swab; Respiratory  Result Value Ref Range Status   SARS-CoV-2, NAA NOT DETECTED NOT DETECTED Final    Comment: (NOTE) This nucleic acid amplification test was developed and its performance characteristics determined by [employer]. Nucleic acid amplification tests include PCR and TMA. This test has not been FDA cleared or approved. This test has been authorized by FDA under an Emergency Use Authorization (EUA). This test is only  authorized for the duration of time the declaration that circumstances exist justifying the authorization of the emergency use of in vitro diagnostic tests for detection of SARS-CoV-2 virus and/or diagnosis of COVID-19 infection under section 564(b)(1) of the Act, 21 U.S.C. [id number]) (1), unless the authorization is terminated or revoked sooner. When diagnostic testing is negative, the possibility of a false negative result should be considered in the context of a patient's recent exposures and the presence of clinical signs and symptoms consistent with COVID-19. An individual without symptoms of COVID- 19 and who is not shedding SARS-CoV-2 vi rus would expect to  have a negative (not detected) result in this assay. Performed At: [facility] [address] [place], [place] [id number] Rush Farmer MD [id number]    [place]  Final    Comment: Performed at [facility], [place] [address] [place], [place] [address]  SARS CORONAVIRUS 2 (TAT 6-24 HRS) Nasopharyngeal Nasopharyngeal Swab     Status: None   Collection Time: 05/01/19  5:46 PM   Specimen: Nasopharyngeal Swab  Result Value Ref Range Status   SARS Coronavirus 2 NEGATIVE NEGATIVE Final    Comment: (NOTE) SARS-CoV-2 target nucleic acids are NOT DETECTED. The SARS-CoV-2 RNA is generally detectable in upper and lower respiratory specimens during the acute phase of infection. Negative results do not preclude SARS-CoV-2 infection, do not rule out co-infections with other pathogens, and should not be used as the sole basis for treatment or other patient management decisions. Negative results must be combined with clinical observations, patient history, and epidemiological information. The expected result is Negative. Fact Sheet for Patients: SugarRoll.be Fact Sheet for Healthcare Providers: https://www.woods-mathews.com/ This test is not yet  approved or cleared by the [place] FDA and  has been authorized for detection and/or diagnosis of SARS-CoV-2 by FDA under an Emergency Use Authorization (EUA). This EUA will remain  in effect (meaning this test can be used) for the duration of the COVID-19 declaration under Section 56 4(b)(1) of the Act, 21 U.S.C. section 360bbb-3(b)(1), unless the authorization is terminated or revoked sooner. Performed at [facility], [place] [address], [place], [place] [address]          Radiology Studies: Ct Head Wo Contrast  Result Date: 05/01/2019 CLINICAL DATA:  Altered level of consciousness. Patient's daughter reports that she has had altered mental status for a few weeks. EXAM: CT HEAD WITHOUT CONTRAST TECHNIQUE: Contiguous axial images were obtained from the base of the skull through the vertex without intravenous contrast. COMPARISON:  CT scan dated 09/27/2012 FINDINGS: Brain: No evidence of acute infarction, hemorrhage, hydrocephalus, extra-axial collection or mass lesion/mass effect. Vascular: No hyperdense vessel or unexpected calcification. Skull: Normal. Negative for fracture or focal lesion. Sinuses/Orbits: Normal. Other: None IMPRESSION: Normal exam. Electronically Signed   By: Lorriane Shire M.D.   On: 05/01/2019 14:40   Dg Shoulder Left Port  Result Date: 05/01/2019 CLINICAL DATA:  Left shoulder pain.  Recent fall. EXAM: LEFT SHOULDER - 1 VIEW COMPARISON:  Left shoulder x-rays dated October 20, 2012. FINDINGS: No acute fracture or dislocation. Unchanged mild acromioclavicular osteoarthritis. Bone mineralization is normal. Soft tissues are unremarkable. IMPRESSION: 1.  No acute osseous abnormality. Electronically Signed   By: Titus Dubin M.D.   On: 05/01/2019 12:43        Scheduled Meds: . DULoxetine  60 mg Oral BID  . feeding supplement (ENSURE ENLIVE)  237 mL Oral TID BM  . furosemide  40 mg Oral BID  . losartan  25 mg Oral Daily  . methimazole  5 mg Oral Daily  .  metoprolol succinate  100 mg Oral Daily  . multivitamin with minerals  1 tablet Oral Daily  . potassium chloride SA  40 mEq Oral BID  . Rivaroxaban  15 mg Oral Q supper  . sucralfate  1 g Oral TID WC & HS   Continuous Infusions: . sodium chloride Stopped (05/01/19 1242)  . amiodarone 30 mg/hr (05/02/19 0159)     LOS: 3 days     Vernell Leep, MD, FACP, [facility]. [facility]  To contact  the attending provider between 7A-7P or the covering provider during after hours 7P-7A, please log into the web site [email or web address] and access using universal [place] password for that web site. If you do not have the password, please call the hospital operator.  05/02/2019, 10:56 AM

## 2019-05-02 NOTE — Progress Notes (Signed)
Notified of a 6-beat run of NSVT, pt asymptomatic. 2g Mg already ordered. Continue to monitor.

## 2019-05-02 NOTE — Progress Notes (Signed)
Cardiology md, [place] return call, notified  B/p 97/81, HR 137-145, asymtomatic , on amiodarone 30 mg/hr, no new order noted, states keep monitor on pt , pt rest in bed, continue monitor

## 2019-05-02 NOTE — Progress Notes (Addendum)
Progress Note  Patient Name: Brianna Porter Date of Encounter: 05/02/2019  Primary Cardiologist: Shirlee More, MD  Primary Electrophysiologist: Dr. Lovena Le (new this admission)  Subjective   Denies any chest pain, SOB or palpitations.  Currently in afib with HR in the 140's laying in bed  Inpatient Medications    Scheduled Meds: . DULoxetine  60 mg Oral BID  . feeding supplement (ENSURE ENLIVE)  237 mL Oral TID BM  . furosemide  40 mg Oral BID  . losartan  25 mg Oral Daily  . methimazole  5 mg Oral Daily  . metoprolol succinate  100 mg Oral Daily  . multivitamin with minerals  1 tablet Oral Daily  . potassium chloride SA  40 mEq Oral BID  . Rivaroxaban  15 mg Oral Q supper  . sucralfate  1 g Oral TID WC & HS   Continuous Infusions: . sodium chloride Stopped (05/01/19 1242)  . amiodarone 30 mg/hr (05/02/19 0159)   PRN Meds: sodium chloride, acetaminophen, ALPRAZolam, ondansetron (ZOFRAN) IV   Vital Signs    Vitals:   05/02/19 0518 05/02/19 0519 05/02/19 0928 05/02/19 0928  BP: 98/63  100/62 100/62  Pulse:   (!) 109 (!) 109  Resp: 18     Temp: 98.2 F (36.8 C)     TempSrc: Oral     SpO2:  98%    Weight: 58.5 kg     Height:        Intake/Output Summary (Last 24 hours) at 05/02/2019 1054 Last data filed at 05/02/2019 [id number] Gross per 24 hour  Intake 1306.83 ml  Output 750 ml  Net 556.83 ml   Last 3 Weights 05/02/2019 05/01/2019 04/30/2019  Weight (lbs) 129 lb 130 lb 1.6 oz (No Data)  Weight (kg) 58.514 kg 59.013 kg (No Data)      Telemetry    Atrial fibrillation with RVR in the 140's - Personally Reviewed  ECG    No new EKG to review - Personally Reviewed  Physical Exam   GEN: Well nourished, well developed in no acute distress HEENT: Normal NECK: No JVD; No carotid bruits LYMPHATICS: No lymphadenopathy CARDIAC:irregularly irreguar and tachy, no murmurs, rubs, gallops RESPIRATORY:  Clear to auscultation without rales, wheezing or rhonchi  ABDOMEN:  Soft, non-tender, non-distended MUSCULOSKELETAL:  No edema; No deformity  SKIN: Warm and dry NEUROLOGIC:  Alert and oriented x 3 PSYCHIATRIC:  Normal affect   Labs    High Sensitivity Troponin:  No results for input(s): TROPONINIHS in the last 720 hours.    Chemistry Recent Labs  Lab 04/29/19 1628  04/30/19 1152 04/30/19 1430 05/01/19 0731 05/02/19 0748  NA 119*  --  126* 125* 129* 129*  K 2.6*   < > 3.0*  --  3.3* 3.5  CL 74*  --  84*  --  90* 89*  CO2 28  --  26  --  22 26  GLUCOSE 160*  --  119*  --  140* 110*  BUN 12  --  7  --  6 8  CREATININE 1.32*  --  1.08*  --  1.07* 1.37*  CALCIUM 7.6*  --  8.1*  --  9.0 9.4  PROT 6.4*  --   --   --   --   --   ALBUMIN 3.6  --   --   --   --   --   AST 41  --   --   --   --   --  ALT 63*  --   --   --   --   --   ALKPHOS 77  --   --   --   --   --   BILITOT 0.8  --   --   --   --   --   GFRNONAA 45*  --  57*  --  58* 43*  GFRAA 52*  --  >60  --  >60 49*  ANIONGAP 17*  --  16*  --  17* 14   < > = values in this interval not displayed.     Hematology Recent Labs  Lab 04/30/19 0529 05/01/19 0040 05/02/19 0748  WBC 8.5 8.0 5.6  RBC 4.22 4.29 4.52  HGB 10.5* 10.7* 11.3*  HCT 32.8* 33.6* 36.3  MCV 77.7* 78.3* 80.3  MCH 24.9* 24.9* 25.0*  MCHC 32.0 31.8 31.1  RDW 16.4* 16.5* 16.6*  PLT 394 382 404*    BNPNo results for input(s): BNP, PROBNP in the last 168 hours.   DDimer No results for input(s): DDIMER in the last 168 hours.   Radiology    Ct Head Wo Contrast  Result Date: 05/01/2019 CLINICAL DATA:  Altered level of consciousness. Patient's daughter reports that she has had altered mental status for a few weeks. EXAM: CT HEAD WITHOUT CONTRAST TECHNIQUE: Contiguous axial images were obtained from the base of the skull through the vertex without intravenous contrast. COMPARISON:  CT scan dated 09/27/2012 FINDINGS: Brain: No evidence of acute infarction, hemorrhage, hydrocephalus, extra-axial collection or mass  lesion/mass effect. Vascular: No hyperdense vessel or unexpected calcification. Skull: Normal. Negative for fracture or focal lesion. Sinuses/Orbits: Normal. Other: None IMPRESSION: Normal exam. Electronically Signed   By: Lorriane Shire M.D.   On: 05/01/2019 14:40   Dg Shoulder Left Port  Result Date: 05/01/2019 CLINICAL DATA:  Left shoulder pain.  Recent fall. EXAM: LEFT SHOULDER - 1 VIEW COMPARISON:  Left shoulder x-rays dated October 20, 2012. FINDINGS: No acute fracture or dislocation. Unchanged mild acromioclavicular osteoarthritis. Bone mineralization is normal. Soft tissues are unremarkable. IMPRESSION: 1.  No acute osseous abnormality. Electronically Signed   By: Titus Dubin M.D.   On: 05/01/2019 12:43    Cardiac Studies   TEE 04/29/19 IMPRESSIONS   1. The left ventricle has severely reduced systolic function, with an ejection fraction of 20-25%. The cavity size was normal. Left ventrical global hypokinesis without regional wall motion abnormalities. 2. The right ventricle has severely reduced systolic function. The cavity was normal. There is no increase in right ventricular wall thickness. Right ventricular systolic pressure is normal. 3. No evidence of a thrombus present in the left atrial appendage. 4. Mild mitral insufficiency. The jet is centrally-directed. 5. The aortic root, ascending aorta, aortic arch and descending aorta are normal in size and structure. 6. No intracardiac thrombi or masses were visualized.  DCCV 04/29/19 Cardioverted1time(s).  Cardioversion with synchronized biphasic120Jshock.  Evaluation: Findings: Post procedure EKG shows:NSR, very frequent PACs Complications:None Patientdidtolerate procedure well.  Patient Profile   Brianna Porter a 58 y.o.female former smoker (quit in 2011)with a h/o hyperthyroidism being treated w/ methimazole, recent diagnosis of atrial fibrillation/ atrial flutter,CHA2DS2 VASc scoreof 4 (CHF, HTN, DM  and Female), on Xarelto for a/c, new diagnosis of systolic HF, Stage III CKD, HTN, HLD and DM, being admitted for CHF w/u and management of her atrial arrthymias.  Initially presented for outpatient TEE guided DCCV 9/9 for atrial fibrillation/flutter, but admitted for further management given new finding of severe  LV dysfunction and frequent PACs, atrial couplets and brief nonsustained PAT on post cardioversion EKG and borderline hypotension, necessitating IV amiodarone loading.   Assessment & Plan    1. Atrial Fibrillation/ Atrial Flutter: fairly new diagnosis. In the setting of hyperthyroidism. TEE w/ severely reduced LVEF at 20%, mildly dilated LA and mild MR.CHA2DS2 VASc scoreof 4 (CHF, HTN, DM and Female).This patients CHA2DS2-VASc Score and unadjusted Ischemic Stroke Rate (% per year) is equal to4.8 % stroke rate/year from a score of 4. S/p  DCCV 04/29/19 w/ post procedural EKG showing NSR w/ frequent PACs, atrial couplets and brief nonsustained PAT.  AAD therapy initiated post cardioversion to help maintain NSR>>currently getting IV amiodarone.  On oral  blocker, metoprolol (Toprol XL) 100 mg daily   Rhythm remains difficult to control, NSR post DCCV>>reverted to Afib>>converted back to sinus w/ amiodarone>>back in afib yetserday w/ RVR up to the 160s and remains in afib with HR in 140's  Will give additional Amio 150mg  bolus IV  Start Digoxin 0.125mg  PO daily (lower dose since on Amio)  Daily EKG to follow QTc  Continue methimazole for hyperthyroidism. TFTs WNL.   Continue to monitor electrolytes. Keep K > 4 and Mg > 2  Continue Xarelto for a/c, reduced dose 15 mg daily due to CrCl <50 mL/min  ? underlying OSA. Needs outpatient sleep study  Will ask EP to see for further recommendations  2. Cardiomyopathy/ Systolic HF:new diagnosis.   TEE 04/29/19 showed severely reduced LVEF at 20% w/ global hypokinesis.   She has multiple risk factors for ischemic heart disease, but  suspect CM likely tachy mediated from rapid afib/flutter. She denies any recent anginal symptoms.   S/p cardioversion but back in fib/flutter. Continue rhythm control strategy  Once rhythm is controlled, recommend repeat 2D echo in 2-3 months to reassess LVEF.  If EF not improved despite maintaining NSR, later consider ischemic evaluation   Continue Toprol XL 100 mg daily and Losartan 25mg  daily  Given concomitant T2DM, later consider addition of an SGLT2i  Consider addition of spironolactone given LV dysfunction and may also help with hypokalemia - will add tomorrow if renal function stable  3. Hyperthyroidism:limited past medical records available. Followed primarily by [facility] in [place]. On methimazole as outpatient. Prescribed 02/2019. This may be the driver behind her atrial fibrillation/flutter  Based on TSH and free T3/T4, she appears to have reached euthyroidstate  Will continue home methimazole dose for now  F/u with PCP post discharge for further management  Continue ? blocker therapy w/ metoprolol   4. [id number] at 100/26mmhg this am  Monitor closely   5. Stage III CKD: Scr 1.32 on admit.  Scr improved, down from 1.32>>1.07 >>1.37  Continue to monitor   6. Hypokalemia: K 2.6 on admit  Improved some w/ supplementation but still hypokalemic at 3.5 today and Mag 1.8  To keep K >4 will increase Kdur dose to 40 mEq TID  Mg borderline low at 1.8 - suppl with 2gm Mag sulfate IV once today  Continue to monitor closely. F/u BMET in am  7. Hypomagnesemia: 1.8 today  Will order 2 g IV Magnesium sulfate supplementation   F/u Mg level and BMET in am  8. Hyponatremia: Acute on chronic.  Labs on the patient's portal show her sodium levels at baseline has been chronically low with most recent values appearing to trend down 130 on 7/25 and 127 on 8/26.  Suspect hypervolemic hyponatremia related with progressively worsening heart failure  Urine sodium  normal   Na has been stable the last 2 days as 129  Further management per IM   I have spent a total of 35 minutes with patient reviewing 2D echo , telemetry, EKGs, labs and examining patient as well as establishing an assessment and plan that was discussed with the patient.  > 50% of time was spent in direct patient care.    For questions or updates, please contact [place] Please consult [email or web address] for contact info under        Signed, Fransico Him, MD  05/02/2019, 10:54 AM

## 2019-05-02 NOTE — Progress Notes (Addendum)
   Vital Signs MEWS/VS Documentation      05/02/2019 0614 05/02/2019 0615 05/02/2019 0928 05/02/2019 0928   MEWS Score:  4  3  2  2    MEWS Score Color:  Red  Yellow  Yellow  Yellow   Pulse:  -  -  (!) 109  (!) 109   BP:  -  -  100/62  100/62     Pt continues to be on a-fib with RVR. Vitals were retaken and improved. Pt is on continuous amiodarone drip. Pt is currently in bed, daughter at bedside, call bell within reach. Vitals to be retaken and to monitor.      [place] 05/02/2019,10:04 AM

## 2019-05-02 NOTE — Progress Notes (Signed)
Notify provider about HR 137-145/min, pt rest in bed asymtomatic, cough occasion, refused any pain, rest in bed, continue monitor

## 2019-05-02 NOTE — Progress Notes (Signed)
2 gram Mg received from pharmacy and is currently running.  Patient is currently lying in bed watching TV, daughter is at bedside. Continuing delirium precautions.

## 2019-05-02 NOTE — Progress Notes (Signed)
Per CCMD, pt had a 6 beat run of V-Tach. Patient is currently asymptomatic, lying in bed watching television, with call bell within reach. Daughter at bedside. Currently infusing amiodarone.

## 2019-05-02 NOTE — Progress Notes (Addendum)
Per CCMD pt had a 6 beat run of V-Tach. Vital signs reassessed.    05/02/19 1124  Vitals  Temp 98 F (36.7 C)  Temp Source Oral  BP 119/90  MAP (mmHg) 101  BP Location Left Arm  BP Method Automatic  Patient Position (if appropriate) Lying  Pulse Rate (!) 135  Pulse Rate Source Monitor  Resp 14  Oxygen Therapy  SpO2 100 %  O2 Device Room Air  MEWS Score  MEWS RR 0  MEWS Pulse 3  MEWS Systolic 0  MEWS LOC 0  MEWS Temp 0  MEWS Score 3  MEWS Score Color Yellow   Patient denies shortness of breath and chest pain. [facility] PA paged to make aware. No new orders at this time.

## 2019-05-02 NOTE — Progress Notes (Signed)
Mews 3, check pt in room, check v/s, pt is asymptomatic, mews score is 2, heart rate is in the range md awared, pt rest in bed, family is at bedside, continue monitor

## 2019-05-02 NOTE — Progress Notes (Signed)
Pt tugging on IV tape and IV sites wrapped in gauze applied. Currently infusing magnesium 2 grams and amiodarone 16.7 mL/hr.

## 2019-05-03 ENCOUNTER — Inpatient Hospital Stay (HOSPITAL_COMMUNITY): Payer: Medicare HMO

## 2019-05-03 LAB — BASIC METABOLIC PANEL
Anion gap: 11 (ref 5–15)
BUN: 8 mg/dL (ref 6–20)
CO2: 25 mmol/L (ref 22–32)
Calcium: 9.5 mg/dL (ref 8.9–10.3)
Chloride: 94 mmol/L — ABNORMAL LOW (ref 98–111)
Creatinine, Ser: 1 mg/dL (ref 0.44–1.00)
GFR calc Af Amer: >60 mL/min (ref >60)
GFR calc non Af Amer: >60 mL/min (ref >60)
Glucose, Bld: 134 mg/dL — ABNORMAL HIGH (ref 70–99)
Potassium: 4.9 mmol/L (ref 3.5–5.1)
Sodium: 130 mmol/L — ABNORMAL LOW (ref 135–145)

## 2019-05-03 LAB — CBC
HCT: 36.7 % (ref 36.0–46.0)
Hemoglobin: 11.4 g/dL — ABNORMAL LOW (ref 12.0–15.0)
MCH: 25.1 pg — ABNORMAL LOW (ref 26.0–34.0)
MCHC: 31.1 g/dL (ref 30.0–36.0)
MCV: 80.7 fL (ref 80.0–100.0)
Platelets: 431 10*3/uL — ABNORMAL HIGH (ref 150–400)
RBC: 4.55 MIL/uL (ref 3.87–5.11)
RDW: 16.8 % — ABNORMAL HIGH (ref 11.5–15.5)
WBC: 6.2 10*3/uL (ref 4.0–10.5)
nRBC: 0 % (ref 0.0–0.2)

## 2019-05-03 LAB — GLUCOSE, CAPILLARY
Glucose-Capillary: 140 mg/dL — ABNORMAL HIGH (ref 70–99)
Glucose-Capillary: 119 mg/dL — ABNORMAL HIGH (ref 70–99)
Glucose-Capillary: 121 mg/dL — ABNORMAL HIGH (ref 70–99)
Glucose-Capillary: 126 mg/dL — ABNORMAL HIGH (ref 70–99)
Glucose-Capillary: 120 mg/dL — ABNORMAL HIGH (ref 70–99)

## 2019-05-03 LAB — MAGNESIUM: Magnesium: 2 mg/dL (ref 1.7–2.4)

## 2019-05-03 LAB — VITAMIN B12: Vitamin B-12: 3982 pg/mL — ABNORMAL HIGH (ref 180–914)

## 2019-05-03 IMAGING — MR MR HEAD W/O CM
12 of 13 series · 44 of 48 positions shown · non-contrast
Comparison: Head CT [DATE]

CLINICAL DATA: In cephalopathy. Diabetes and hypertension.
Confusion and falling.

EXAM:
MRI HEAD WITHOUT CONTRAST
TECHNIQUE: Multiplanar, multiecho pulse sequences of the brain and surrounding
structures were obtained without intravenous contrast.

[Series 5: DWI · axial · 3.0mm · 0.88mm/px · z∈[-62,+88]mm · 8 of 104 slices shown (1 of 4)]
[im 1/104]
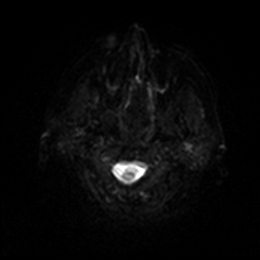
[im 15/104]
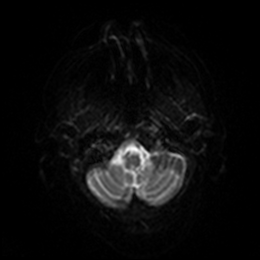
[im 30/104]
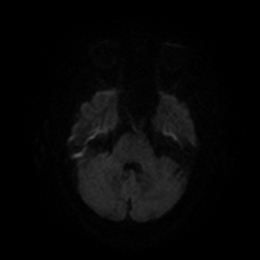
[im 45/104]
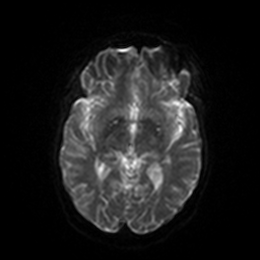
[im 59/104]
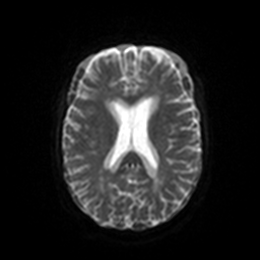
[im 74/104]
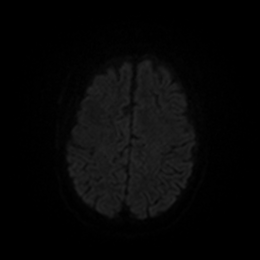
[im 89/104]
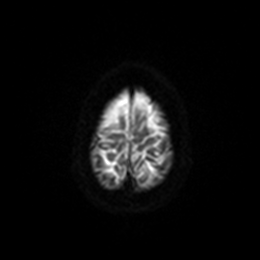
[im 104/104]
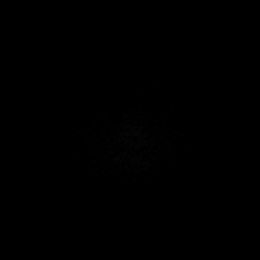

[Series 6: DWI · axial · 3.0mm · 0.88mm/px · z∈[-62,+85]mm · 4 of 51 slices shown (2 of 4)]
[im 1/51]
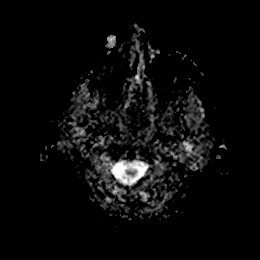
[im 17/51]
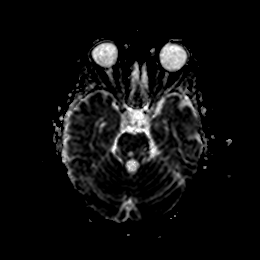
[im 34/51]
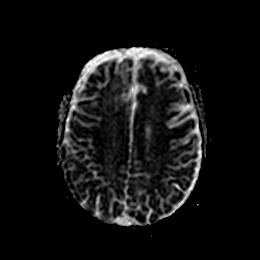
[im 51/51]
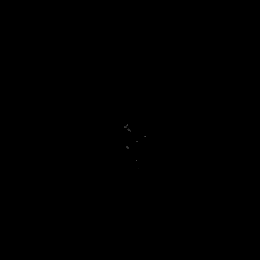

[Series 7: DWI · coronal · 4.0mm · 0.88mm/px · 6 of 74 slices shown (3 of 4)]
[im 1/74]
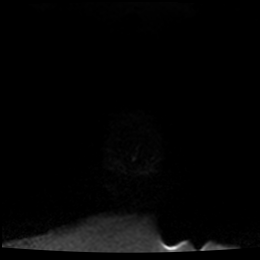
[im 15/74]
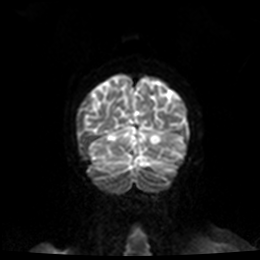
[im 30/74]
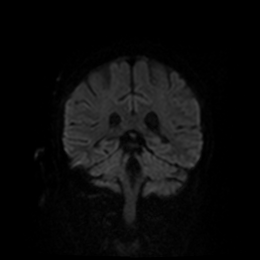
[im 44/74]
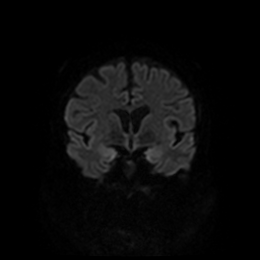
[im 59/74]
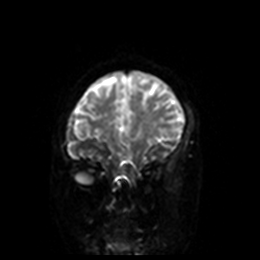
[im 74/74]
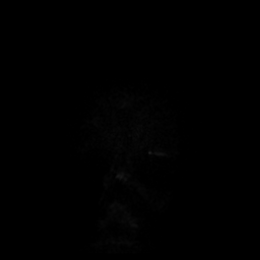

[Series 8: DWI · coronal · 4.0mm · 0.88mm/px · 3 of 36 slices shown (4 of 4)]
[im 1/36]
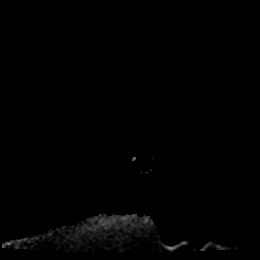
[im 18/36]
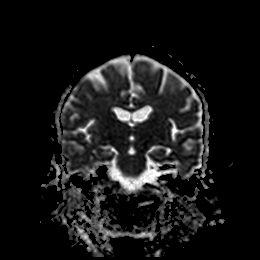
[im 36/36]
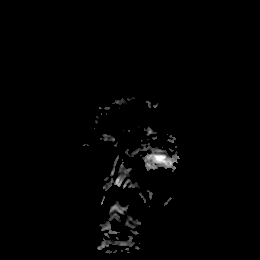

[Series 14: T2 · axial · 5.0mm · 0.72mm/px · z∈[-59,+82]mm · 2 of 25 slices shown (1 of 2)]
[im 1/25]
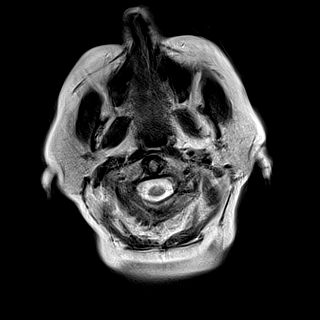
[im 25/25]
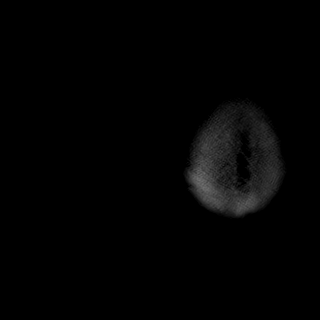

[Series 19: FLAIR · axial · 5.0mm · 0.45mm/px · z∈[-57,+83]mm · 2 of 25 slices shown]
[im 1/25]
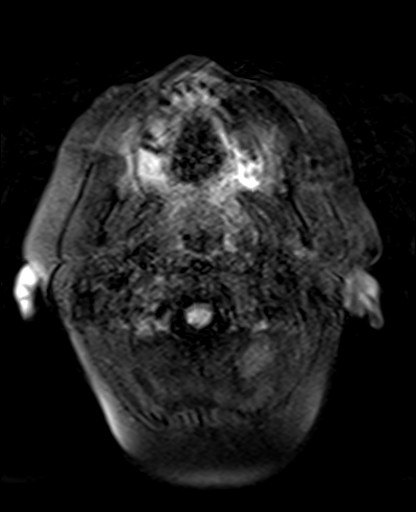
[im 25/25]
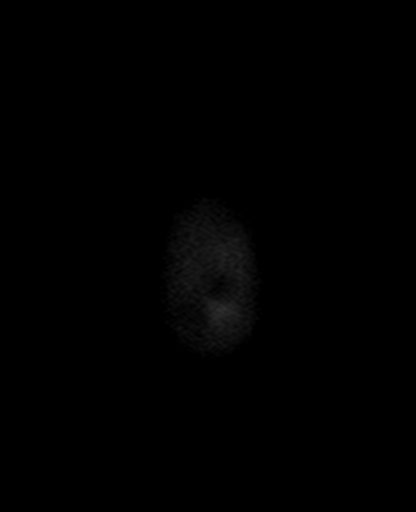

[Series 20: mag_images · axial · 3.0mm · 0.90mm/px · z∈[-61,+88]mm · 4 of 52 slices shown]
[im 1/52]
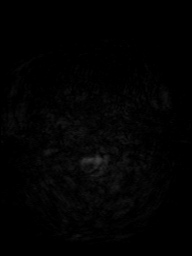
[im 18/52]
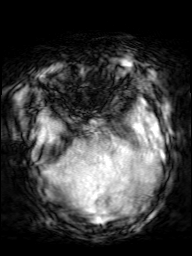
[im 35/52]
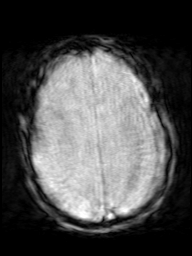
[im 52/52]
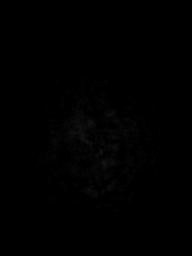

[Series 21: pha_images · axial · 3.0mm · 0.90mm/px · z∈[-53,+82]mm · 3 of 44 slices shown]
[im 1/44]
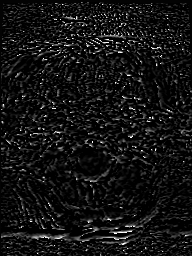
[im 22/44]
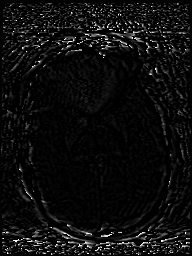
[im 44/44]
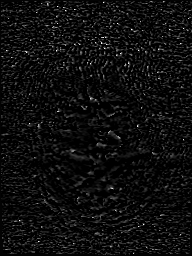

[Series 22: swi_images · axial · 3.0mm · 0.90mm/px · z∈[-61,+88]mm · 4 of 52 slices shown]
[im 1/52]
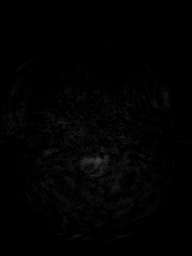
[im 18/52]
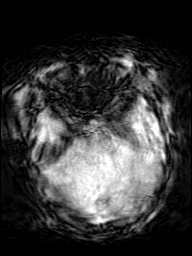
[im 35/52]
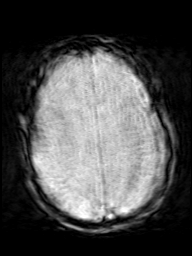
[im 52/52]
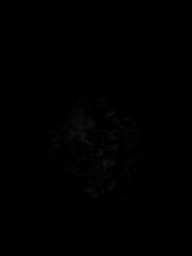

[Series 23: mip_images(sw) · axial · 24.0mm · 0.90mm/px · z∈[-51,+77]mm · 4 of 45 slices shown]
[im 1/45]
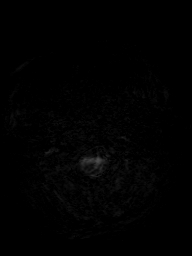
[im 15/45]
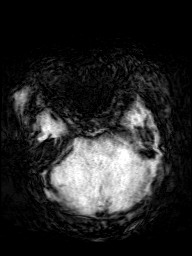
[im 30/45]
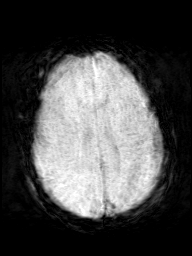
[im 45/45]
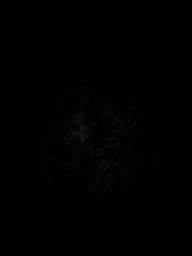

[Series 25: T1 · sagittal · 5.0mm · 0.94mm/px · 2 of 25 slices shown]
[im 1/25]
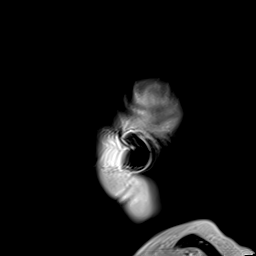
[im 25/25]
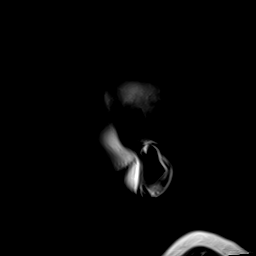

[Series 26: T2 · coronal · 5.0mm · 0.72mm/px · 2 of 29 slices shown (2 of 2)]
[im 1/29]
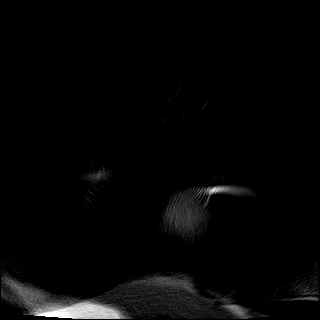
[im 29/29]
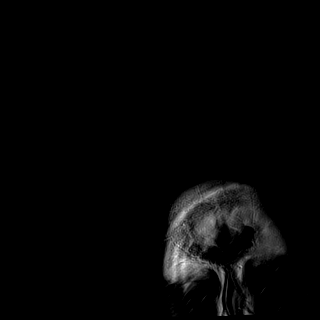

[44 of 48 positions shown; findings below may reference images not displayed]

FINDINGS: Brain: The study suffers from motion degradation. Allowing for that,
there is no evidence of acute infarction, mass lesion, hemorrhage,
hydrocephalus or extra-axial collection. There are minor small
vessel changes of the cerebral hemispheric white matter.

Vascular: Major vessels at the base of the brain show flow.

Skull and upper cervical spine: Negative

Sinuses/Orbits: Clear/normal

Other: None
IMPRESSION: Motion degraded exam. No acute or reversible finding. Mild chronic
small-vessel ischemic change of the cerebral hemispheric white
matter.

## 2019-05-03 MED ORDER — THIAMINE HCL 100 MG/ML IJ SOLN
250.0000 mg | Freq: Every day | INTRAVENOUS | Status: AC
  Administered 2019-05-03: 250 mg via INTRAVENOUS
  Filled 2019-05-03 (×4): qty 2.5

## 2019-05-03 MED ORDER — SPIRONOLACTONE 12.5 MG HALF TABLET
12.5000 mg | ORAL_TABLET | Freq: Every day | ORAL | Status: DC
  Administered 2019-05-03 – 2019-05-07 (×5): 12.5 mg via ORAL
  Filled 2019-05-03 (×5): qty 1

## 2019-05-03 MED ORDER — RIVAROXABAN 20 MG PO TABS: 20.0000 mg | ORAL_TABLET | Freq: Every day | ORAL | Status: DC

## 2019-05-03 NOTE — Progress Notes (Signed)
   Vital Signs MEWS/VS Documentation      05/03/2019 1154 05/03/2019 1332 05/03/2019 1625 05/03/2019 1803   MEWS Score:  3  3  0  2   MEWS Score Color:  Yellow  Yellow  Green  Yellow   Resp:  20  -  20  -   Pulse:  (!) 139  (!) 150  (!) 57  (!) 127   BP:  123/89  (!) 129/99  (!) 148/100  120/77   Temp:  98.1 F (36.7 C)  -  98 F (36.7 C)  -   O2 Device:  Room Air  -  Room Air  -       Vitals taken per MEWS protocol.     Horris Latino 05/03/2019,7:01 PM

## 2019-05-03 NOTE — Plan of Care (Signed)
  Problem: Education: Goal: Ability to demonstrate management of disease process will improve Outcome: Progressing Goal: Ability to verbalize understanding of medication therapies will improve Outcome: Progressing Goal: Individualized Educational Video(s) Outcome: Progressing   Problem: Activity: Goal: Capacity to carry out activities will improve Outcome: Progressing   Problem: Cardiac: Goal: Ability to achieve and maintain adequate cardiopulmonary perfusion will improve Outcome: Progressing   Problem: Education: Goal: Knowledge of disease or condition will improve Outcome: Progressing Goal: Understanding of medication regimen will improve Outcome: Progressing Goal: Individualized Educational Video(s) Outcome: Progressing   Problem: Activity: Goal: Ability to tolerate increased activity will improve Outcome: Progressing   Problem: Cardiac: Goal: Ability to achieve and maintain adequate cardiopulmonary perfusion will improve Outcome: Progressing   Problem: Health Behavior/Discharge Planning: Goal: Ability to safely manage health-related needs after discharge will improve Outcome: Progressing   Problem: Education: Goal: Knowledge of General Education information will improve Description: Including pain rating scale, medication(s)/side effects and non-pharmacologic comfort measures Outcome: Progressing   Problem: Health Behavior/Discharge Planning: Goal: Ability to manage health-related needs will improve Outcome: Progressing   Problem: Clinical Measurements: Goal: Ability to maintain clinical measurements within normal limits will improve Outcome: Progressing Goal: Will remain free from infection Outcome: Progressing Goal: Diagnostic test results will improve Outcome: Progressing Goal: Respiratory complications will improve Outcome: Progressing Goal: Cardiovascular complication will be avoided Outcome: Progressing   Problem: Activity: Goal: Risk for activity  intolerance will decrease Outcome: Progressing   Problem: Nutrition: Goal: Adequate nutrition will be maintained Outcome: Progressing   Problem: Coping: Goal: Level of anxiety will decrease Outcome: Progressing   Problem: Elimination: Goal: Will not experience complications related to bowel motility Outcome: Progressing Goal: Will not experience complications related to urinary retention Outcome: Progressing   Problem: Pain Managment: Goal: General experience of comfort will improve Outcome: Progressing   Problem: Safety: Goal: Ability to remain free from injury will improve Outcome: Progressing   Problem: Skin Integrity: Goal: Risk for impaired skin integrity will decrease Outcome: Progressing   

## 2019-05-03 NOTE — Progress Notes (Signed)
   Vital Signs MEWS/VS Documentation      05/03/2019 0722 05/03/2019 0802 05/03/2019 1010 05/03/2019 1154   MEWS Score:  3  3  3  3    MEWS Score Color:  Yellow  Yellow  Yellow  Yellow   Resp:  20  -  -  20   Pulse:  (!) 140  -  (!) 133  (!) 139   BP:  117/84  -  120/67  123/89   Temp:  98.2 F (36.8 C)  -  -  98.1 F (36.7 C)   O2 Device:  Room Air  -  -  Room Air   Level of Consciousness:  -  Alert  -  -       Pt continues to be in a-fib with RVR. Amiodarone drip running continuously. Pt asymptomatic, continues to be confused. Daughter at bedside. Daughter was advised to call for assistance if needed. Vitals continued to be taken frequently.      [place] 05/03/2019,12:31 PM

## 2019-05-03 NOTE — Progress Notes (Signed)
Addendum:  MRI brain report appreciated as below and no obvious cause for ongoing AMS:  IMPRESSION: Motion degraded exam. No acute or reversible finding. Mild chronic small-vessel ischemic change of the cerebral hemispheric white Matter.  Given poor oral intake for several weeks, check B1 level and start IV thiamine 250 mg daily x3 days. Check HIV screen, RPR, B12 levels.  Vernell Leep, MD, FACP, [facility]. [facility]  To contact the attending provider between 7A-7P or the covering provider during after hours 7P-7A, please log into the web site [email or web address] and access using universal [place] password for that web site. If you do not have the password, please call the hospital operator.

## 2019-05-03 NOTE — Progress Notes (Signed)
Pt's daughter brought food for pt. Pt ate 50%. Daughter at bedside and will stay the night.

## 2019-05-03 NOTE — Progress Notes (Signed)
Thiamine (B-1) IVPB has not arrived from pharmacy. Spoke with pharmacist so that they can tube medication. Pharmacy will tube medication soon, will make oncoming night shift nurse aware.

## 2019-05-03 NOTE — Progress Notes (Signed)
PROGRESS NOTE   Brianna Porter  [id number]    DOB: Dec 06, 1960    DOA: 04/29/2019  PCP: Cher Nakai, MD   I have briefly reviewed patients previous medical records in [facility].  Chief Complaints   Brief Narrative:  58 year old female, reportedly lives with her daughter and friend, states that she is independent, PMH of hypothyroidism treated with methimazole, DM 2, HTN, HLD, stage III chronic kidney disease, suspected chronic hyponatremia, former tobacco abuse, anxiety, depression, anemia, recent diagnosis of A. fib/atrial flutter on Xarelto, new diagnosis of acute systolic CHF, presented for outpatient TEE guided DCCV for A. fib/flutter on 9/9 but admitted for further management given new severe LV dysfunction and A. fib with RVR.  TRH were consulted on 9/10 regarding need for further endocrine work-up.  On admission family reported 3 weeks history of progressive decline, intractable nausea, vomiting, 25 pound weight loss, progressively worsening confusion and falls at home.  Hospital course complicated by persistent A. fib with RVR up to the 140s despite IV Cardizem drip and high-dose Toprol-XL, cardiology considering repeat DCCV on 9/14; ongoing mental status changes without significant improvement and work-up thus far negative, requesting MRI brain without contrast on 9/13.   Assessment & Plan:   Active Problems:   Persistent atrial fibrillation   Heart failure, unspecified (HCC)   Atrial fibrillation (HCC)   Prolonged Q-T interval on ECG   Atrial fibrillation/atrial flutter with RVR  TEE: LVEF 20%.  CHA2DS2 VASc scoreof 4  S/p DCCV 04/29/2019 and postprocedural EKG showed NSR with frequent PACs, atrial couplets and brief nonsustained PAT.  Difficult to maintain sinus rhythm post DCCV and has been in and out of A. fib with RVR.  Hyperthyroid controlled on methimazole with normal TFTs.  Consider outpatient sleep study for possible OSA.  EP cardiology seen 9/10, had  recommended continuing amiodarone, not a great candidate for A. fib ablation for multiple comorbidities.  However patient having rhythm control issues on amiodarone and EP may need to weigh in again.  Despite Amiodarone drip, additional IV amiodarone bolus on 9/12 and Toprol-XL at 100 mg daily, has persistent A. fib with RVR in the 140s.  Cardiology follow-up appreciated and are considering DCCV on 9/14 due to persistent tachycardia.  Acute systolic CHF/cardiomyopathy  Management per primary service/cardiology  TEE: LVEF 20% with global hypokinesis.  Suspecting tachycardia mediated from rapid A. fib/flutter.  No anginal symptoms noted.  Plan to control A. fib/flutter and then repeat echo in 2 to 3 months to reassess LVEF and if not improved despite controlling A. fib or NSR may consider ischemic evaluation.  Continue Toprol-XL 100 mg daily.  No ACEI/ARB or ARN I due to hyponatremia.  Compensated on Lasix 40 mg twice daily by mouth.  Cardiology of added spironolactone 12.5 mg daily on 9/13.  Hyperthyroidism  Reportedly followed by [facility] in [place].  Continue methimazole 5 mg daily and Toprol-XL for rate control.  TFTs normal.  Clinically appears euthyroid.  Essential hypertension  Reasonably controlled on Toprol-XL 100 mg daily and losartan 25 mg daily, continue  Hyponatremia  Suspect subacute on chronic.  As per cardiology follow-up, had sodium of 130 on 7/25 and 127 on 8/26. No history of alcohol abuse.  Not on thiazides PTA.  Had sodium of 133 on 9/3, presented with sodium of 119 on 9/9 which has improved and stable in the 129-130 range for the last 3 days.  Serum osmolarity 255, urine osmolarity 145, urine sodium 52, TFTs normal, random cortisol 13.8.  Not consistent with SIADH or polydipsia.  Could be multifactorial related to hypervolemic hyponatremia from CHF and also dehydration related to poor oral intake, nausea and vomiting.  Monitor BMP  closely.  Acute on stage III chronic kidney disease  Presented with creatinine of 1.33, improved to 1.07, back up to 1.37  Now likely related to Lasix, ARB and although oral intake has improved, no nausea or vomiting, intake is to probably poor.  Held last evening's dose of Lasix.  Creatinine back to normal.  Continue prior dose of Lasix 40 mg twice daily, cardiology adding spironolactone 12.5 mg daily.  Follow BMP closely.  Hypokalemia/hypomagnesemia  Replaced.  Potassium is 4.9 and magnesium 2.  Follow BMP closely while on losartan and newly started Aldactone.  Acute metabolic encephalopathy  As per my discussion with patient's daughter, 1 to 2-week history of progressively worsening confusion, more so on night prior to admission.  No new medication except Xarelto.  Has been on Cymbalta and PRN Xanax for a long time.  No history of alcohol use.  Suspect multifactorial related to hyponatremia, dehydration, acute kidney injury, polypharmacy including Cymbalta, Xanax, sleep deprivation from even PTA and hospital delirium.   No focal deficits.  No infectious etiology.    Delirium precautions.  Avoid medications that may precipitate mental status changes i.e. sedatives, opiates etc.  CT head without acute findings.  Urine microscopy without UTI features.  As per daughter at bedside, confusion has not really improved and is actually worse in the hospital, especially during the evening hours suggestive of sundowning.  Work-up thus far has been negative.  Will check MRI brain without contrast to rule out embolic strokes.  Daughter indicates that patient does not have any metal objects in her body.  Intractable nausea and vomiting, weight loss  Unclear etiology.  Lipase normal.  No acute findings on abdominal exam.  Continue Carafate for now.  As per daughter at bedside, no further nausea or vomiting in the hospital and eating better but still poor intake.  Encouraged patient to eat  more.  Consult dietitian for nutritional supplements.  Left shoulder pain  Given history of fall, obtain shoulder x-ray without acute findings.  Has not complained of shoulder pain in the last 2 days.  Type II DM  Controlled.  A1c 5.9.  Anemia, suspect of chronic disease  Stable.  Prolonged QTC  Continue monitoring on telemetry.  Replace potassium and magnesium.  Avoid precipitating medications.  EKG 9/10 showed QTC of 597.  Repeat EKG 9/13: QTC 489 ms.  Anxiety and depression  Patient on Xanax as needed and Cymbalta at home.  Has been on these medications chronically.  Estimated body mass index is 23.21 kg/m as calculated from the following:   Height as of this encounter: 5\' 3"  (1.6 m).   Weight as of this encounter: 59.4 kg.    Nutritional Status Nutrition Problem: Inadequate oral intake Etiology: nausea, vomiting Signs/Symptoms: meal completion < 50% Interventions: Ensure Enlive (each supplement provides 350kcal and 20 grams of protein), MVI  DVT prophylaxis: Xarelto Code Status: Full Family Communication: Discussed again with patient's daughter in detail at bedside, updated care and answered all questions. Disposition: To be determined pending clinical improvement.  Patient not medically stable for discharge yet.   Consultants:  TRH are consultants  Procedures:  None  Antimicrobials:  None   Subjective: Patient is a poor historian due to mental status changes.  Alert and oriented only to self.  Denies complaints.  Denies dyspnea or  chest pain.  As per daughter, got confused around 5:55 PM last night which progressively worsened without agitation.  Slept okay but interrupted by nursing due to checking on RVR.  Eating a little better without nausea or vomiting but still overall poor oral intake.  Objective:  Vitals:   05/02/19 2212 05/03/19 0122 05/03/19 0524 05/03/19 0722  BP:  (!) 119/96 (!) 116/53 117/84  Pulse: (!) 132 (!) 136 (!) 145 (!) 140   Resp:   18 20  Temp:   98.2 F (36.8 C) 98.2 F (36.8 C)  TempSrc:   Oral Oral  SpO2:   99% 100%  Weight:   59.4 kg   Height:        Examination:  General exam: Pleasant middle-aged female, moderately built and nourished lying comfortably propped up in bed.  Oral mucosa moist. Respiratory system: Clear to auscultation.  No increased work of breathing. Cardiovascular system: S1 and S2 heard, irregularly irregular and persistent tachycardia.  No JVD, murmurs or pedal edema.  Telemetry personally reviewed: A. fib with RVR persistently in the 140s-150s and has never consistently come down below 120. Gastrointestinal system: Abdomen is nondistended, soft and nontender. No organomegaly or masses felt. Normal bowel sounds heard. Central nervous system: Alert and oriented x1. No focal neurological deficits. Extremities: Symmetric 5 x 5 power except left shoulder, reluctant to move because of pain but able to hold up when elevated by me.  No evidence of external acute findings.  Extensive superficial bruising of both upper extremities and a couple bruises on lower extremities.  No change. Skin: No rashes, lesions or ulcers Psychiatry: Judgement and insight impaired. Mood & affect flat.     Data Reviewed: I have personally reviewed following labs and imaging studies  CBC: Recent Labs  Lab 04/29/19 1628 04/30/19 0529 05/01/19 0040 05/02/19 0748 05/03/19 0545  WBC 8.3 8.5 8.0 5.6 6.2  HGB 10.7* 10.5* 10.7* 11.3* 11.4*  HCT 33.8* 32.8* 33.6* 36.3 36.7  MCV 79.3* 77.7* 78.3* 80.3 80.7  PLT 489* 394 382 404* [id number]*   Basic Metabolic Panel: Recent Labs  Lab 04/29/19 1628 04/30/19 0529 04/30/19 1152 04/30/19 1430 05/01/19 0040 05/01/19 0731 05/02/19 0748 05/03/19 0545  NA 119*  --  126* 125*  --  129* 129* 130*  K 2.6* 3.6 3.0*  --   --  3.3* 3.5 4.9  CL 74*  --  84*  --   --  90* 89* 94*  CO2 28  --  26  --   --  22 26 25   GLUCOSE 160*  --  119*  --   --  140* 110* 134*  BUN 12   --  7  --   --  6 8 8   CREATININE 1.32*  --  1.08*  --   --  1.07* 1.37* 1.00  CALCIUM 7.6*  --  8.1*  --   --  9.0 9.4 9.5  MG  --  1.2*  --   --  1.8 1.7 1.8 2.0   Liver Function Tests: Recent Labs  Lab 04/29/19 1628  AST 41  ALT 63*  ALKPHOS 77  BILITOT 0.8  PROT 6.4*  ALBUMIN 3.6    Cardiac Enzymes: No results for input(s): CKTOTAL, CKMB, CKMBINDEX, TROPONINI in the last 168 hours.  CBG: Recent Labs  Lab 05/02/19 0616 05/02/19 1138 05/02/19 1636 05/02/19 2110 05/03/19 0609  GLUCAP 96 127* 150* 118* 140*    Recent Results (from the past 240 hour(s))  Novel  Coronavirus, NAA (Hosp order, Send-out to Ref Lab; TAT 18-24 hrs     Status: None   Collection Time: 04/25/19 11:48 AM   Specimen: Nasopharyngeal Swab; Respiratory  Result Value Ref Range Status   SARS-CoV-2, NAA NOT DETECTED NOT DETECTED Final    Comment: (NOTE) This nucleic acid amplification test was developed and its performance characteristics determined by [employer]. Nucleic acid amplification tests include PCR and TMA. This test has not been FDA cleared or approved. This test has been authorized by FDA under an Emergency Use Authorization (EUA). This test is only authorized for the duration of time the declaration that circumstances exist justifying the authorization of the emergency use of in vitro diagnostic tests for detection of SARS-CoV-2 virus and/or diagnosis of COVID-19 infection under section 564(b)(1) of the Act, 21 U.S.C. [id number]) (1), unless the authorization is terminated or revoked sooner. When diagnostic testing is negative, the possibility of a false negative result should be considered in the context of a patient's recent exposures and the presence of clinical signs and symptoms consistent with COVID-19. An individual without symptoms of COVID- 19 and who is not shedding SARS-CoV-2 vi rus would expect to have a negative (not detected) result in this assay. Performed  At: [facility] ([place]) [address] [place], [place] [id number] Rush Farmer MD [id number]    [place]  Final    Comment: Performed at [facility], [place] [address], [place], [place] [address]  SARS CORONAVIRUS 2 (TAT 6-24 HRS) Nasopharyngeal Nasopharyngeal Swab     Status: None   Collection Time: 05/01/19  5:46 PM   Specimen: Nasopharyngeal Swab  Result Value Ref Range Status   SARS Coronavirus 2 NEGATIVE NEGATIVE Final    Comment: (NOTE) SARS-CoV-2 target nucleic acids are NOT DETECTED. The SARS-CoV-2 RNA is generally detectable in upper and lower respiratory specimens during the acute phase of infection. Negative results do not preclude SARS-CoV-2 infection, do not rule out co-infections with other pathogens, and should not be used as the sole basis for treatment or other patient management decisions. Negative results must be combined with clinical observations, patient history, and epidemiological information. The expected result is Negative. Fact Sheet for Patients: SugarRoll.be Fact Sheet for Healthcare Providers: https://www.woods-mathews.com/ This test is not yet approved or cleared by the [place] FDA and  has been authorized for detection and/or diagnosis of SARS-CoV-2 by FDA under an Emergency Use Authorization (EUA). This EUA will remain  in effect (meaning this test can be used) for the duration of the COVID-19 declaration under Section 56 4(b)(1) of the Act, 21 U.S.C. section 360bbb-3(b)(1), unless the authorization is terminated or revoked sooner. Performed at [facility], [place] [address] [place], [place] [address]          Radiology Studies: Ct Head Wo Contrast  Result Date: 05/01/2019 CLINICAL DATA:  Altered level of consciousness. Patient's daughter reports that she has had altered mental status for a few weeks. EXAM: CT HEAD WITHOUT CONTRAST TECHNIQUE: Contiguous  axial images were obtained from the base of the skull through the vertex without intravenous contrast. COMPARISON:  CT scan dated 09/27/2012 FINDINGS: Brain: No evidence of acute infarction, hemorrhage, hydrocephalus, extra-axial collection or mass lesion/mass effect. Vascular: No hyperdense vessel or unexpected calcification. Skull: Normal. Negative for fracture or focal lesion. Sinuses/Orbits: Normal. Other: None IMPRESSION: Normal exam. Electronically Signed   By: Lorriane Shire M.D.   On: 05/01/2019 14:40   Dg Shoulder Left Port  Result Date: 05/01/2019  CLINICAL DATA:  Left shoulder pain.  Recent fall. EXAM: LEFT SHOULDER - 1 VIEW COMPARISON:  Left shoulder x-rays dated October 20, 2012. FINDINGS: No acute fracture or dislocation. Unchanged mild acromioclavicular osteoarthritis. Bone mineralization is normal. Soft tissues are unremarkable. IMPRESSION: 1.  No acute osseous abnormality. Electronically Signed   By: Titus Dubin M.D.   On: 05/01/2019 12:43        Scheduled Meds:  digoxin  0.125 mg Oral Daily   DULoxetine  60 mg Oral BID   feeding supplement (ENSURE ENLIVE)  237 mL Oral TID BM   furosemide  40 mg Oral BID   losartan  25 mg Oral Daily   methimazole  5 mg Oral Daily   metoprolol succinate  100 mg Oral Daily   multivitamin with minerals  1 tablet Oral Daily   Rivaroxaban  15 mg Oral Q supper   spironolactone  12.5 mg Oral Daily   sucralfate  1 g Oral TID WC & HS   Continuous Infusions:  sodium chloride 250 mL (05/02/19 1347)   amiodarone 30 mg/hr (05/03/19 [id number])     LOS: 4 days     Vernell Leep, MD, FACP, [facility]. [facility]  To contact the attending provider between 7A-7P or the covering provider during after hours 7P-7A, please log into the web site [email or web address] and access using universal [place] password for that web site. If you do not have the password, please call the hospital operator.  05/03/2019, 9:49 AM

## 2019-05-03 NOTE — Progress Notes (Addendum)
Progress Note  Patient Name: Brianna Porter Date of Encounter: 05/03/2019  Primary Cardiologist: Shirlee More, MD  Primary Electrophysiologist: Dr. Lovena Le (new this admission)  Subjective   Feels ok this am.  Denies any palpitations or SOB  Inpatient Medications    Scheduled Meds: . digoxin  0.125 mg Oral Daily  . DULoxetine  60 mg Oral BID  . feeding supplement (ENSURE ENLIVE)  237 mL Oral TID BM  . furosemide  40 mg Oral BID  . losartan  25 mg Oral Daily  . methimazole  5 mg Oral Daily  . metoprolol succinate  100 mg Oral Daily  . multivitamin with minerals  1 tablet Oral Daily  . Rivaroxaban  15 mg Oral Q supper  . sucralfate  1 g Oral TID WC & HS   Continuous Infusions: . sodium chloride 250 mL (05/02/19 1347)  . amiodarone 30 mg/hr (05/03/19 [id number])   PRN Meds: sodium chloride, acetaminophen, ALPRAZolam, ondansetron (ZOFRAN) IV   Vital Signs    Vitals:   05/02/19 2212 05/03/19 0122 05/03/19 0524 05/03/19 0722  BP:  (!) 119/96 (!) 116/53 117/84  Pulse: (!) 132 (!) 136 (!) 145 (!) 140  Resp:   18 20  Temp:   98.2 F (36.8 C) 98.2 F (36.8 C)  TempSrc:   Oral Oral  SpO2:   99% 100%  Weight:   59.4 kg   Height:        Intake/Output Summary (Last 24 hours) at 05/03/2019 0850 Last data filed at 05/03/2019 [id number] Gross per 24 hour  Intake 1491.08 ml  Output 1000 ml  Net 491.08 ml   Last 3 Weights 05/03/2019 05/02/2019 05/01/2019  Weight (lbs) 131 lb 129 lb 130 lb 1.6 oz  Weight (kg) 59.421 kg 58.514 kg 59.013 kg      Telemetry    Atrial flutter with RVR at 140's - Personally Reviewed  ECG    Atrial flutter with variable block and RVR - Personally Reviewed  Physical Exam   GEN: Well nourished, well developed in no acute distress HEENT: Normal NECK: No JVD; No carotid bruits LYMPHATICS: No lymphadenopathy CARDIAC:irregularly irregular, no murmurs, rubs, gallops RESPIRATORY:  Clear to auscultation without rales, wheezing or rhonchi  ABDOMEN: Soft,  non-tender, non-distended MUSCULOSKELETAL:  No edema; No deformity  SKIN: Warm and dry NEUROLOGIC:  Alert and oriented x 3 PSYCHIATRIC:  Normal affect   Labs    High Sensitivity Troponin:  No results for input(s): TROPONINIHS in the last 720 hours.    Chemistry Recent Labs  Lab 04/29/19 1628  05/01/19 0731 05/02/19 0748 05/03/19 0545  NA 119*   < > 129* 129* 130*  K 2.6*   < > 3.3* 3.5 4.9  CL 74*   < > 90* 89* 94*  CO2 28   < > 22 26 25   GLUCOSE 160*   < > 140* 110* 134*  BUN 12   < > 6 8 8   CREATININE 1.32*   < > 1.07* 1.37* 1.00  CALCIUM 7.6*   < > 9.0 9.4 9.5  PROT 6.4*  --   --   --   --   ALBUMIN 3.6  --   --   --   --   AST 41  --   --   --   --   ALT 63*  --   --   --   --   ALKPHOS 77  --   --   --   --  BILITOT 0.8  --   --   --   --   GFRNONAA 45*   < > 58* 43* >60  GFRAA 52*   < > >60 49* >60  ANIONGAP 17*   < > 17* 14 11   < > = values in this interval not displayed.     Hematology Recent Labs  Lab 05/01/19 0040 05/02/19 0748 05/03/19 0545  WBC 8.0 5.6 6.2  RBC 4.29 4.52 4.55  HGB 10.7* 11.3* 11.4*  HCT 33.6* 36.3 36.7  MCV 78.3* 80.3 80.7  MCH 24.9* 25.0* 25.1*  MCHC 31.8 31.1 31.1  RDW 16.5* 16.6* 16.8*  PLT 382 404* 431*    BNPNo results for input(s): BNP, PROBNP in the last 168 hours.   DDimer No results for input(s): DDIMER in the last 168 hours.   Radiology    Ct Head Wo Contrast  Result Date: 05/01/2019 CLINICAL DATA:  Altered level of consciousness. Patient's daughter reports that she has had altered mental status for a few weeks. EXAM: CT HEAD WITHOUT CONTRAST TECHNIQUE: Contiguous axial images were obtained from the base of the skull through the vertex without intravenous contrast. COMPARISON:  CT scan dated 09/27/2012 FINDINGS: Brain: No evidence of acute infarction, hemorrhage, hydrocephalus, extra-axial collection or mass lesion/mass effect. Vascular: No hyperdense vessel or unexpected calcification. Skull: Normal. Negative for  fracture or focal lesion. Sinuses/Orbits: Normal. Other: None IMPRESSION: Normal exam. Electronically Signed   By: Lorriane Shire M.D.   On: 05/01/2019 14:40   Dg Shoulder Left Port  Result Date: 05/01/2019 CLINICAL DATA:  Left shoulder pain.  Recent fall. EXAM: LEFT SHOULDER - 1 VIEW COMPARISON:  Left shoulder x-rays dated October 20, 2012. FINDINGS: No acute fracture or dislocation. Unchanged mild acromioclavicular osteoarthritis. Bone mineralization is normal. Soft tissues are unremarkable. IMPRESSION: 1.  No acute osseous abnormality. Electronically Signed   By: Titus Dubin M.D.   On: 05/01/2019 12:43    Cardiac Studies   TEE 04/29/19 IMPRESSIONS   1. The left ventricle has severely reduced systolic function, with an ejection fraction of 20-25%. The cavity size was normal. Left ventrical global hypokinesis without regional wall motion abnormalities. 2. The right ventricle has severely reduced systolic function. The cavity was normal. There is no increase in right ventricular wall thickness. Right ventricular systolic pressure is normal. 3. No evidence of a thrombus present in the left atrial appendage. 4. Mild mitral insufficiency. The jet is centrally-directed. 5. The aortic root, ascending aorta, aortic arch and descending aorta are normal in size and structure. 6. No intracardiac thrombi or masses were visualized.  DCCV 04/29/19 Cardioverted1time(s).  Cardioversion with synchronized biphasic120Jshock.  Evaluation: Findings: Post procedure EKG shows:NSR, very frequent PACs Complications:None Patientdidtolerate procedure well.  Patient Profile   Brianna Porter a 58 y.o.female former smoker (quit in 2011)with a h/o hyperthyroidism being treated w/ methimazole, recent diagnosis of atrial fibrillation/ atrial flutter,CHA2DS2 VASc scoreof 4 (CHF, HTN, DM and Female), on Xarelto for a/c, new diagnosis of systolic HF, Stage III CKD, HTN, HLD and DM, being admitted  for CHF w/u and management of her atrial arrthymias.  Initially presented for outpatient TEE guided DCCV 9/9 for atrial fibrillation/flutter, but admitted for further management given new finding of severe LV dysfunction and frequent PACs, atrial couplets and brief nonsustained PAT on post cardioversion EKG and borderline hypotension, necessitating IV amiodarone loading.   Assessment & Plan    1. Atrial Fibrillation/ Atrial Flutter: fairly new diagnosis. In the setting of hyperthyroidism. TEE w/  severely reduced LVEF at 20%, mildly dilated LA and mild MR.CHA2DS2 VASc scoreof 4 (CHF, HTN, DM and Female).This patients CHA2DS2-VASc Score and unadjusted Ischemic Stroke Rate (% per year) is equal to4.8 % stroke rate/year from a score of 4. S/p  DCCV 04/29/19 w/ post procedural EKG showing NSR w/ frequent PACs, atrial couplets and brief nonsustained PAT.  AAD therapy initiated post cardioversion to help maintain NSR>>currently getting IV amiodarone.  On oral  blocker, metoprolol (Toprol XL) 100 mg daily   Rhythm remains difficult to control, NSR post DCCV>>reverted to Afib>>converted back to sinus w/ amiodarone>>back in aflutter with HR remaining in the 140's despite bolusing with IV Amio yesterday  Started Digoxin 0.125mg  PO daily yesterday (lower dose since on Amio)  Daily EKG to follow QTc > QTc 420msec today  Continue methimazole for hyperthyroidism. TFTs WNL.   Continue to monitor electrolytes. Keep K > 4 and Mg > 2  Continue Xarelto for a/c, reduced dose 15 mg daily due to CrCl <50 mL/min  ? underlying OSA. Needs outpatient sleep study  Will ask EP to see for further recommendations give her persistent tachycardia - may need to consider repeat DCCV tomorrow now that she is on IV Amio  2. Cardiomyopathy/ Systolic HF:new diagnosis.   TEE 04/29/19 showed severely reduced LVEF at 20% w/ global hypokinesis.   She has multiple risk factors for ischemic heart disease, but suspect CM  likely tachy mediated from rapid afib/flutter. She denies any recent anginal symptoms.   S/p cardioversion but back in fib/flutter with RVR.   Need to try to maintain NSR given LV dysfunction  Once NSR restored, recommend repeat 2D echo in 2-3 months to reassess LVEF.  If EF not improved despite maintaining NSR, later consider ischemic evaluation   Continue Toprol XL 100 mg daily and Losartan 25mg  daily  Given concomitant T2DM, later consider addition of an SGLT2i  Add spiro 12.5mg  daily and watch Na and K+  BMET in am  3. Hyperthyroidism:limited past medical records available. Followed primarily by [facility] in [place]. On methimazole as outpatient. Prescribed 02/2019. This may be the driver behind her atrial fibrillation/flutter  Based on TSH and free T3/T4, she appears to have reached euthyroidstate  Will continue home methimazole dose for now  F/u with PCP post discharge for further management  Continue ? blocker therapy w/ metoprolol   4. HTN:  Stable at 116/34mmHg this am  Monitor closely   Continue Toprol XL 100mg  daily  5. Stage III CKD: Scr 1.32 on admit.  Scr improved, down from 1.32>>1.07 >>1.37>>1.0 today  Improved creatinine with diuresis  Starting low dose spiro today for LV dysfunction and will need to monitor creatinine closely  6. Hypokalemia: K 2.6 on admit  Improved w/ supplementation and K+ 4.9 today  Starting Arlyce Harman today for LV dysfunction so will need to follow K+ closely  7. Hypomagnesemia:   repleted yesterday and 2.0 this am  8. Hyponatremia: Acute on chronic.  Labs on the patient's portal show her sodium levels at baseline has been chronically low with most recent values appearing to trend down 130 on 7/25 and 127 on 8/26.  Suspect hypervolemic hyponatremia related with progressively worsening heart failure  Urine sodium normal   Na slowly  improving with diuresis and now 130 today  I have spent a total of 35  minutes with patient reviewing TEE , telemetry, EKGs, labs and examining patient as well as establishing an assessment and plan that was discussed with the patient.  >  50% of time was spent in direct patient care.    For questions or updates, please contact [place] Please consult [email or web address] for contact info under        Signed, Fransico Him, MD  05/03/2019, 8:50 AM

## 2019-05-03 NOTE — Progress Notes (Signed)
Pt is stable, md awared of heart rate, pt rest in bed, asymtomatic

## 2019-05-04 ENCOUNTER — Encounter (HOSPITAL_COMMUNITY)
Admission: AD | Disposition: A | Discharge status: Home / Self Care | Payer: Self-pay | Source: Home / Self Care | Attending: Cardiovascular Disease

## 2019-05-04 ENCOUNTER — Inpatient Hospital Stay (HOSPITAL_COMMUNITY): Payer: Medicare HMO | Admitting: Anesthesiology

## 2019-05-04 ENCOUNTER — Encounter (HOSPITAL_COMMUNITY): Payer: Self-pay | Admitting: *Deleted

## 2019-05-04 DIAGNOSIS — E871 Hypo-osmolality and hyponatremia: Secondary | ICD-10-CM

## 2019-05-04 DIAGNOSIS — I10 Essential (primary) hypertension: Secondary | ICD-10-CM

## 2019-05-04 DIAGNOSIS — I4892 Unspecified atrial flutter: Secondary | ICD-10-CM

## 2019-05-04 DIAGNOSIS — R9431 Abnormal electrocardiogram [ECG] [EKG]: Secondary | ICD-10-CM

## 2019-05-04 DIAGNOSIS — I5023 Acute on chronic systolic (congestive) heart failure: Secondary | ICD-10-CM

## 2019-05-04 DIAGNOSIS — I4891 Unspecified atrial fibrillation: Secondary | ICD-10-CM

## 2019-05-04 DIAGNOSIS — N183 Chronic kidney disease, stage 3 (moderate): Secondary | ICD-10-CM

## 2019-05-04 DIAGNOSIS — E876 Hypokalemia: Secondary | ICD-10-CM

## 2019-05-04 HISTORY — PX: CARDIOVERSION: SHX1299

## 2019-05-04 LAB — GLUCOSE, CAPILLARY
Glucose-Capillary: 114 mg/dL — ABNORMAL HIGH (ref 70–99)
Glucose-Capillary: 113 mg/dL — ABNORMAL HIGH (ref 70–99)
Glucose-Capillary: 158 mg/dL — ABNORMAL HIGH (ref 70–99)
Glucose-Capillary: 118 mg/dL — ABNORMAL HIGH (ref 70–99)

## 2019-05-04 LAB — CBC
HCT: 42.5 % (ref 36.0–46.0)
Hemoglobin: 12.6 g/dL (ref 12.0–15.0)
MCH: 24.4 pg — ABNORMAL LOW (ref 26.0–34.0)
MCHC: 29.6 g/dL — ABNORMAL LOW (ref 30.0–36.0)
MCV: 82.4 fL (ref 80.0–100.0)
Platelets: 544 10*3/uL — ABNORMAL HIGH (ref 150–400)
RBC: 5.16 MIL/uL — ABNORMAL HIGH (ref 3.87–5.11)
RDW: 16.9 % — ABNORMAL HIGH (ref 11.5–15.5)
WBC: 9.2 10*3/uL (ref 4.0–10.5)
nRBC: 0.2 % (ref 0.0–0.2)

## 2019-05-04 LAB — BASIC METABOLIC PANEL
Anion gap: 14 (ref 5–15)
BUN: 10 mg/dL (ref 6–20)
CO2: 22 mmol/L (ref 22–32)
Calcium: 9.5 mg/dL (ref 8.9–10.3)
Chloride: 95 mmol/L — ABNORMAL LOW (ref 98–111)
Creatinine, Ser: 0.92 mg/dL (ref 0.44–1.00)
GFR calc Af Amer: >60 mL/min (ref >60)
GFR calc non Af Amer: >60 mL/min (ref >60)
Glucose, Bld: 138 mg/dL — ABNORMAL HIGH (ref 70–99)
Potassium: 3.8 mmol/L (ref 3.5–5.1)
Sodium: 131 mmol/L — ABNORMAL LOW (ref 135–145)

## 2019-05-04 LAB — RPR: RPR Ser Ql: NONREACTIVE

## 2019-05-04 LAB — MAGNESIUM: Magnesium: 1.6 mg/dL — ABNORMAL LOW (ref 1.7–2.4)

## 2019-05-04 SURGERY — CARDIOVERSION
Anesthesia: General

## 2019-05-04 MED ORDER — RIVAROXABAN 15 MG PO TABS: 15.0000 mg | ORAL_TABLET | Freq: Every day | ORAL | Status: DC

## 2019-05-04 MED ORDER — PROPOFOL 10 MG/ML IV BOLUS
INTRAVENOUS | Status: DC | PRN
  Administered 2019-05-04: 50 mg via INTRAVENOUS

## 2019-05-04 MED ORDER — MAGNESIUM SULFATE 2 GM/50ML IV SOLN
2.0000 g | Freq: Once | INTRAVENOUS | Status: AC
  Administered 2019-05-04: 2 g via INTRAVENOUS
  Filled 2019-05-04: qty 50

## 2019-05-04 MED ORDER — SODIUM CHLORIDE 0.9 % IV SOLN: INTRAVENOUS | Status: DC

## 2019-05-04 MED ORDER — SODIUM CHLORIDE 0.9% FLUSH: 3.0000 mL | INTRAVENOUS | Status: DC | PRN

## 2019-05-04 MED ORDER — RIVAROXABAN 20 MG PO TABS: 20.0000 mg | ORAL_TABLET | Freq: Every day | ORAL | Status: DC

## 2019-05-04 MED ORDER — POTASSIUM CHLORIDE CRYS ER 20 MEQ PO TBCR
20.0000 meq | EXTENDED_RELEASE_TABLET | Freq: Every day | ORAL | Status: DC
  Administered 2019-05-04 – 2019-05-07 (×4): 20 meq via ORAL
  Filled 2019-05-04 (×4): qty 1

## 2019-05-04 MED ORDER — AMIODARONE HCL 150 MG/3ML IV SOLN
150.0000 mg | Freq: Once | INTRAVENOUS | Status: AC
  Administered 2019-05-04: 150 mg via INTRAVENOUS

## 2019-05-04 MED ORDER — RIVAROXABAN 15 MG PO TABS
15.0000 mg | ORAL_TABLET | Freq: Every day | ORAL | Status: DC
  Administered 2019-05-04 – 2019-05-06 (×3): 15 mg via ORAL
  Filled 2019-05-04 (×3): qty 1

## 2019-05-04 MED ORDER — SODIUM CHLORIDE 0.9 % IV SOLN
250.0000 mL | INTRAVENOUS | Status: DC
  Administered 2019-05-04: 250 mL via INTRAVENOUS

## 2019-05-04 NOTE — Progress Notes (Signed)
Completing rounds and introduced myself to Ms. Sellinger on an Initial Visit.  Told her about chaplain services and to page nurse if she ever wanted a visit.

## 2019-05-04 NOTE — CV Procedure (Signed)
[place]: ON DOAC with no thrombus TEE 9/9  Shock x 1 150 J Converted from rapid afib to NSR rate 70's No immediate neurologic sequelae  Jenkins Rouge

## 2019-05-04 NOTE — Progress Notes (Signed)
PROGRESS NOTE   Brianna Porter  [id number]    DOB: [birth year]    DOA: 04/29/2019  PCP: Cher Nakai, MD   I have briefly reviewed patients previous medical records in [facility]  Chief Complaints   Brief Narrative:  58 year old female, reportedly lives with her daughter and friend, states that she is independent, PMH of hypothyroidism treated with methimazole, DM 2, HTN, HLD, stage III chronic kidney disease, suspected chronic hyponatremia, former tobacco abuse, anxiety, depression, anemia, recent diagnosis of A. fib/atrial flutter on Xarelto, new diagnosis of acute systolic CHF, presented for outpatient TEE guided DCCV for A. fib/flutter on 9/9 but admitted for further management given new severe LV dysfunction and A. fib with RVR.  TRH were consulted on 9/10 regarding need for further endocrine work-up.  On admission family reported 3 weeks history of progressive decline, intractable nausea, vomiting, 25 pound weight loss, progressively worsening confusion and falls at home.  Hospital course complicated by persistent A. fib with RVR up to the 140s despite IV amiodarone drip and high-dose Toprol-XL, cardiology considering repeat DCCV on 9/14; ongoing mental status changes without significant improvement and work-up thus far negative, MRI brain negative.   Assessment & Plan:   Active Problems:   Persistent atrial fibrillation   Heart failure, unspecified (HCC)   Atrial fibrillation (HCC)   Prolonged Q-T interval on ECG   Atrial flutter with rapid ventricular response (HCC)   Essential hypertension   Hypokalemia   Atrial fibrillation/atrial flutter with RVR  TEE: LVEF 20%.  CHA2DS2 VASc scoreof 4  S/p DCCV 04/29/2019 and postprocedural EKG showed NSR with frequent PACs, atrial couplets and brief nonsustained PAT.  Difficult to maintain sinus rhythm post DCCV and has been in and out of A. fib with RVR.  Hyperthyroid controlled on methimazole with normal TFTs.  Consider  outpatient sleep study for possible OSA.  EP Cardiology seen 9/10, had recommended continuing amiodarone, not a great candidate for A. fib ablation for multiple comorbidities.  EP saw again 9/14 and plan DCCV today now that she is loaded on amiodarone.  She may be a candidate for ablation in the future if EF normalizes.  Despite Amiodarone drip, additional IV amiodarone bolus on 9/12 and Toprol-XL at 100 mg daily, has persistent A. fib with RVR in the 140s.  Discussed with Dr. Radford Pax this morning.  Acute systolic CHF/cardiomyopathy  Management per primary service/cardiology  TEE: LVEF 20% with global hypokinesis.  Suspecting tachycardia mediated from rapid A. fib/flutter.  No anginal symptoms noted.  Plan to control A. fib/flutter and then repeat echo in 2 to 3 months to reassess LVEF and if not improved despite controlling A. fib or NSR may consider ischemic evaluation.  Continue Toprol-XL 100 mg daily.  No ACEI/ARB or ARN I due to hyponatremia.  Compensated on Lasix 40 mg twice daily by mouth and Aldactone 12.5 mg daily  Hyperthyroidism  Reportedly followed by [facility] in [place].  Continue methimazole 5 mg daily and Toprol-XL for rate control.  TFTs normal.  Clinically appears euthyroid.  Essential hypertension  Reasonably controlled on Toprol-XL 100 mg daily and losartan 25 mg daily, continue  Hyponatremia  Suspect subacute on chronic.  As per cardiology follow-up, had sodium of 130 on 7/25 and 127 on 8/26. No history of alcohol abuse.  Not on thiazides PTA.  Had sodium of 133 on 9/3, presented with sodium of 119 on 9/9 which has improved and stable in the 129-130 range for the last 3 days.  Serum  osmolarity 255, urine osmolarity 145, urine sodium 52, TFTs normal, random cortisol 13.8.  Not consistent with SIADH or polydipsia.  Could be multifactorial related to hypervolemic hyponatremia from CHF and also dehydration related to poor oral intake, nausea and  vomiting.  No BMP today, will check in a.m.  Acute on stage III chronic kidney disease  Presented with creatinine of 1.33, improved to 1.07, back up to 1.37  Now likely related to Lasix, ARB and although oral intake has improved, no nausea or vomiting, intake is to probably poor.  Held last evening's dose of Lasix.  Creatinine back to normal.  Continue prior dose of Lasix 40 mg twice daily, cardiology adding spironolactone 12.5 mg daily.  No BMP today, will check in a.m.  Hypokalemia/hypomagnesemia  Replaced.  Potassium is 4.9 and magnesium 2.  Follow BMP closely while on losartan and newly started Aldactone.  Acute metabolic encephalopathy  As per my discussion with patient's daughter, 1 to 2-week history of progressively worsening confusion, more so on night prior to admission.  No new medication except Xarelto.  Has been on Cymbalta and PRN Xanax for a long time.  No history of alcohol use.  Suspect multifactorial related to hyponatremia, dehydration, acute kidney injury, polypharmacy including Cymbalta, Xanax, sleep deprivation from even PTA and hospital delirium.   No focal deficits.  No infectious etiology.    Delirium precautions.  Avoid medications that may precipitate mental status changes i.e. sedatives, opiates etc.  CT head without acute findings.  Urine microscopy without UTI features.  As per daughter at bedside, confusion has not really improved and is actually worse in the hospital, especially during the evening hours suggestive of sundowning.  Work-up thus far has been negative.  MRI brain without acute findings.  B12: 3982.  HIV screen, RPR and B1 level pending.  Complete day 2/3 IV thiamine 250 mg daily.  As per daughter, no significant improvement in mental status.  May need to consider Neurology consultation, probably as outpatient.  Intractable nausea and vomiting, weight loss  Unclear etiology.  Lipase normal.  No acute findings on abdominal  exam.  Continue Carafate for now.  No nausea or vomiting since admission.  Oral intake is gradually improving.  Consult dietitian for nutritional supplements.  Left shoulder pain  Given history of fall, obtain shoulder x-ray without acute findings.  Has not complained of left shoulder pain in several days.  Type II DM  Controlled.  A1c 5.9.  Anemia, suspect of chronic disease  Stable.  Prolonged QTC  Continue monitoring on telemetry.  Replace potassium and magnesium.  Avoid precipitating medications.  EKG 9/10 showed QTC of 597.  Repeat EKG 9/14: QTC 475 ms.  Anxiety and depression  Patient on Xanax as needed and Cymbalta at home.  Has been on these medications chronically.  Estimated body mass index is 22.85 kg/m as calculated from the following:   Height as of this encounter: 5\' 3"  (1.6 m).   Weight as of this encounter: 58.5 kg.    Nutritional Status Nutrition Problem: Inadequate oral intake Etiology: nausea, vomiting Signs/Symptoms: meal completion < 50% Interventions: Ensure Enlive (each supplement provides 350kcal and 20 grams of protein), MVI  DVT prophylaxis: Xarelto Code Status: Full Family Communication: Discussed in detail with patient's daughter at bedside, updated care and answered questions. Disposition: To be determined pending clinical improvement.  Patient not medically stable for discharge yet.   Consultants:  TRH are consultants EP cardiology  Procedures:  None  Antimicrobials:  None  Subjective: Patient remains a poor historian.  Denies complaints.  Does not say much.  As per daughter at bedside, appetite slowly improving without nausea, vomiting or abdominal pain.  No significant change in altered mental status.  No agitation.  No chest pain, dyspnea reported.  Patient seen this morning prior to procedure.  Objective:  Vitals:   05/04/19 0010 05/04/19 0537 05/04/19 0823 05/04/19 0935  BP: (!) 124/93 118/90 (!) 145/84 (!) 135/93   Pulse: (!) 158 (!) 126 (!) 139 (!) 142  Resp: 18 20 18  (!) 22  Temp: 98.6 F (37 C) 97.7 F (36.5 C)  98.4 F (36.9 C)  TempSrc: Oral Oral  Oral  SpO2: 99% 99% 100% 100%  Weight:  58.5 kg    Height:        Examination:  General exam: Pleasant middle-aged female, moderately built and nourished lying comfortably propped up in bed.  Oral mucosa moist. Respiratory system: Clear to auscultation.  No increased work of breathing. Cardiovascular system: S1 and S2 heard, irregularly irregular and tachycardic.  No JVD, murmurs or pedal edema.  Telemetry personally reviewed: Persistent A. fib in the 140s for couple days now. Gastrointestinal system: Abdomen is nondistended, soft and nontender. No organomegaly or masses felt. Normal bowel sounds heard. Central nervous system: Alert and oriented x1. No focal neurological deficits. Extremities: Symmetric 5 x 5 power except left shoulder, reluctant to move because of pain but able to hold up when elevated by me.  No evidence of external acute findings.  Extensive superficial bruising of both upper extremities and a couple bruises on lower extremities.  No change. Skin: No rashes, lesions or ulcers Psychiatry: Judgement and insight impaired. Mood & affect flat.     Data Reviewed: I have personally reviewed following labs and imaging studies  CBC: Recent Labs  Lab 04/30/19 0529 05/01/19 0040 05/02/19 0748 05/03/19 0545 05/04/19 0923  WBC 8.5 8.0 5.6 6.2 9.2  HGB 10.5* 10.7* 11.3* 11.4* 12.6  HCT 32.8* 33.6* 36.3 36.7 42.5  MCV 77.7* 78.3* 80.3 80.7 82.4  PLT 394 382 404* 431* 0000000*   Basic Metabolic Panel: Recent Labs  Lab 04/29/19 1628 04/30/19 0529 04/30/19 1152 04/30/19 1430 05/01/19 0040 05/01/19 0731 05/02/19 0748 05/03/19 0545  NA 119*  --  126* 125*  --  129* 129* 130*  K 2.6* 3.6 3.0*  --   --  3.3* 3.5 4.9  CL 74*  --  84*  --   --  90* 89* 94*  CO2 28  --  26  --   --  22 26 25   GLUCOSE 160*  --  119*  --   --  140*  110* 134*  BUN 12  --  7  --   --  6 8 8   CREATININE 1.32*  --  1.08*  --   --  1.07* 1.37* 1.00  CALCIUM 7.6*  --  8.1*  --   --  9.0 9.4 9.5  MG  --  1.2*  --   --  1.8 1.7 1.8 2.0   Liver Function Tests: Recent Labs  Lab 04/29/19 1628  AST 41  ALT 63*  ALKPHOS 77  BILITOT 0.8  PROT 6.4*  ALBUMIN 3.6    Cardiac Enzymes: No results for input(s): CKTOTAL, CKMB, CKMBINDEX, TROPONINI in the last 168 hours.  CBG: Recent Labs  Lab 05/03/19 1151 05/03/19 1152 05/03/19 1628 05/03/19 2118 05/04/19 0608  GLUCAP 121* 119* 126* 120* 114*    Recent Results (  from the past 240 hour(s))  Novel Coronavirus, NAA (Hosp order, Send-out to Ref Lab; TAT 18-24 hrs     Status: None   Collection Time: 04/25/19 11:48 AM   Specimen: Nasopharyngeal Swab; Respiratory  Result Value Ref Range Status   SARS-CoV-2, NAA NOT DETECTED NOT DETECTED Final    Comment: (NOTE) This nucleic acid amplification test was developed and its performance characteristics determined by [employer]. Nucleic acid amplification tests include PCR and TMA. This test has not been FDA cleared or approved. This test has been authorized by FDA under an Emergency Use Authorization (EUA). This test is only authorized for the duration of time the declaration that circumstances exist justifying the authorization of the emergency use of in vitro diagnostic tests for detection of SARS-CoV-2 virus and/or diagnosis of COVID-19 infection under section 564(b)(1) of the Act, 21 U.S.C. [id number]) (1), unless the authorization is terminated or revoked sooner. When diagnostic testing is negative, the possibility of a false negative result should be considered in the context of a patient's recent exposures and the presence of clinical signs and symptoms consistent with COVID-19. An individual without symptoms of COVID- 19 and who is not shedding SARS-CoV-2 vi rus would expect to have a negative (not detected) result in  this assay. Performed At: [facility] [address] [place], [place] [id number] Rush Farmer MD [id number]    [place]  Final    Comment: Performed at [facility], [place] [address], [place], [place] [address]  SARS CORONAVIRUS 2 (TAT 6-24 HRS) Nasopharyngeal Nasopharyngeal Swab     Status: None   Collection Time: 05/01/19  5:46 PM   Specimen: Nasopharyngeal Swab  Result Value Ref Range Status   SARS Coronavirus 2 NEGATIVE NEGATIVE Final    Comment: (NOTE) SARS-CoV-2 target nucleic acids are NOT DETECTED. The SARS-CoV-2 RNA is generally detectable in upper and lower respiratory specimens during the acute phase of infection. Negative results do not preclude SARS-CoV-2 infection, do not rule out co-infections with other pathogens, and should not be used as the sole basis for treatment or other patient management decisions. Negative results must be combined with clinical observations, patient history, and epidemiological information. The expected result is Negative. Fact Sheet for Patients: SugarRoll.be Fact Sheet for Healthcare Providers: https://www.woods-mathews.com/ This test is not yet approved or cleared by the [place] FDA and  has been authorized for detection and/or diagnosis of SARS-CoV-2 by FDA under an Emergency Use Authorization (EUA). This EUA will remain  in effect (meaning this test can be used) for the duration of the COVID-19 declaration under Section 56 4(b)(1) of the Act, 21 U.S.C. section 360bbb-3(b)(1), unless the authorization is terminated or revoked sooner. Performed at [facility], [place] [address] [place], [place] [address]          Radiology Studies: Mr Brain Wo Contrast  Result Date: 05/03/2019 CLINICAL DATA:  In cephalopathy. Diabetes and hypertension. Confusion and falling. EXAM: MRI HEAD WITHOUT CONTRAST TECHNIQUE: Multiplanar, multiecho pulse  sequences of the brain and surrounding structures were obtained without intravenous contrast. COMPARISON:  Head CT 05/01/2019 FINDINGS: Brain: The study suffers from motion degradation. Allowing for that, there is no evidence of acute infarction, mass lesion, hemorrhage, hydrocephalus or extra-axial collection. There are minor small vessel changes of the cerebral hemispheric white matter. Vascular: Major vessels at the base of the brain show flow. Skull and upper cervical spine: Negative Sinuses/Orbits: Clear/normal Other: None IMPRESSION: Motion degraded exam. No acute or reversible finding.  Mild chronic small-vessel ischemic change of the cerebral hemispheric white matter. Electronically Signed   By: Nelson Chimes M.D.   On: 05/03/2019 11:22        Scheduled Meds:  [MAR Hold] digoxin  0.125 mg Oral Daily   [MAR Hold] DULoxetine  60 mg Oral BID   [MAR Hold] feeding supplement (ENSURE ENLIVE)  237 mL Oral TID BM   [MAR Hold] furosemide  40 mg Oral BID   [MAR Hold] losartan  25 mg Oral Daily   [MAR Hold] methimazole  5 mg Oral Daily   [MAR Hold] metoprolol succinate  100 mg Oral Daily   [MAR Hold] multivitamin with minerals  1 tablet Oral Daily   [MAR Hold] Rivaroxaban  15 mg Oral Q supper   [MAR Hold] spironolactone  12.5 mg Oral Daily   [MAR Hold] sucralfate  1 g Oral TID WC & HS   Continuous Infusions:  [MAR Hold] sodium chloride Stopped (05/04/19 1012)   sodium chloride     sodium chloride     amiodarone 30 mg/hr (05/04/19 0606)   [MAR Hold] thiamine injection Stopped (05/03/19 2135)     LOS: 5 days     Vernell Leep, MD, FACP, [facility]. [facility]  To contact the attending provider between 7A-7P or the covering provider during after hours 7P-7A, please log into the web site [email or web address] and access using universal [place] password for that web site. If you do not have the password, please call the hospital operator.  05/04/2019, 10:14 AM

## 2019-05-04 NOTE — Progress Notes (Addendum)
Electrophysiology Rounding Note  Patient Name: Brianna Porter Date of Encounter: 05/04/2019  Primary Cardiologist: Dr. Bettina Gavia Electrophysiologist: New to Dr. Lovena Le   Subjective   DCCV earlier this admission 9/9. Had recurrent AF and started on amiodarone for rhythm control. Has had recurrent AF and maintained since am of 9/11. EP asked to see again for further recommendations.   Feeling somewhat better this am despite continued Afib RVR. Breathing has improved. She does feel her heart racing. Is able to lie mostly flat.  She bruises extremely easily on her arms.   Inpatient Medications    Scheduled Meds: . digoxin  0.125 mg Oral Daily  . DULoxetine  60 mg Oral BID  . feeding supplement (ENSURE ENLIVE)  237 mL Oral TID BM  . furosemide  40 mg Oral BID  . losartan  25 mg Oral Daily  . methimazole  5 mg Oral Daily  . metoprolol succinate  100 mg Oral Daily  . multivitamin with minerals  1 tablet Oral Daily  . Rivaroxaban  20 mg Oral Q supper  . spironolactone  12.5 mg Oral Daily  . sucralfate  1 g Oral TID WC & HS   Continuous Infusions: . sodium chloride 250 mL (05/02/19 1347)  . amiodarone 30 mg/hr (05/04/19 0606)  . thiamine injection Stopped (05/03/19 2135)   PRN Meds: sodium chloride, acetaminophen, ALPRAZolam, ondansetron (ZOFRAN) IV   Vital Signs    Vitals:   05/03/19 1929 05/03/19 2100 05/04/19 0010 05/04/19 0537  BP: 101/82 (!) 115/102 (!) 124/93 118/90  Pulse: (!) 52 (!) 135 (!) 158 (!) 126  Resp: 18  18 20   Temp: 97.7 F (36.5 C)  98.6 F (37 C) 97.7 F (36.5 C)  TempSrc: Oral  Oral Oral  SpO2: 100%  99% 99%  Weight:    58.5 kg  Height:        Intake/Output Summary (Last 24 hours) at 05/04/2019 0721 Last data filed at 05/04/2019 0606 Gross per 24 hour  Intake 539.4 ml  Output 1150 ml  Net -610.6 ml   Filed Weights   05/02/19 0518 05/03/19 0524 05/04/19 0537  Weight: 58.5 kg 59.4 kg 58.5 kg    Physical Exam    GEN- The patient is  chronically ill appearing, alert and oriented x 3 today.   Head- normocephalic, atraumatic Eyes-  Sclera clear, conjunctiva pink Ears- hearing intact Oropharynx- clear Neck- supple Lungs- Clear to ausculation bilaterally, normal work of breathing Heart- Irregularly irregular rate and rhythm, no murmurs, rubs or gallops GI- soft, NT, ND, + BS Extremities- no clubbing, cyanosis, or edema Skin- Diffuse ecchymosis and several bandages along both arms. Psych- euthymic mood, full affect Neuro- strength and sensation are intact  Labs    CBC Recent Labs    05/02/19 0748 05/03/19 0545  WBC 5.6 6.2  HGB 11.3* 11.4*  HCT 36.3 36.7  MCV 80.3 80.7  PLT 404* [id number]*   Basic Metabolic Panel Recent Labs    05/02/19 0748 05/03/19 0545  NA 129* 130*  K 3.5 4.9  CL 89* 94*  CO2 26 25  GLUCOSE 110* 134*  BUN 8 8  CREATININE 1.37* 1.00  CALCIUM 9.4 9.5  MG 1.8 2.0   Liver Function Tests No results for input(s): AST, ALT, ALKPHOS, BILITOT, PROT, ALBUMIN in the last 72 hours. No results for input(s): LIPASE, AMYLASE in the last 72 hours. Cardiac Enzymes No results for input(s): CKTOTAL, CKMB, CKMBINDEX, TROPONINI in the last 72 hours. BNP Invalid input(s): POCBNP  D-Dimer No results for input(s): DDIMER in the last 72 hours. Hemoglobin A1C No results for input(s): HGBA1C in the last 72 hours. Fasting Lipid Panel No results for input(s): CHOL, HDL, LDLCALC, TRIG, CHOLHDL, LDLDIRECT in the last 72 hours. Thyroid Function Tests No results for input(s): TSH, T4TOTAL, T3FREE, THYROIDAB in the last 72 hours.  Invalid input(s): FREET3  Telemetry    Atrial fibrillation with RVR rates between 120s-150s since am of 9/11 (personally reviewed)  Radiology    Mr Brain Wo Contrast  Result Date: 05/03/2019 CLINICAL DATA:  In cephalopathy. Diabetes and hypertension. Confusion and falling. EXAM: MRI HEAD WITHOUT CONTRAST TECHNIQUE: Multiplanar, multiecho pulse sequences of the brain and  surrounding structures were obtained without intravenous contrast. COMPARISON:  Head CT 05/01/2019 FINDINGS: Brain: The study suffers from motion degradation. Allowing for that, there is no evidence of acute infarction, mass lesion, hemorrhage, hydrocephalus or extra-axial collection. There are minor small vessel changes of the cerebral hemispheric white matter. Vascular: Major vessels at the base of the brain show flow. Skull and upper cervical spine: Negative Sinuses/Orbits: Clear/normal Other: None IMPRESSION: Motion degraded exam. No acute or reversible finding. Mild chronic small-vessel ischemic change of the cerebral hemispheric white matter. Electronically Signed   By: Nelson Chimes M.D.   On: 05/03/2019 11:22     Patient Profile     Brianna Porter a 58 y.o.femaleformer smoker (quit in 2011)with a h/o hyperthyroidism being treated w/ methimazole, recent diagnosis of atrial fibrillation/ atrial flutter,CHA2DS2 VASc scoreof 4 (CHF, HTN, DM and Female), on Xarelto for a/c, new diagnosis of systolic HF, Stage III CKD, HTN, HLD and DM, being admitted for CHF w/u and management of her atrial arrthymias.  Initially presented for outpatient TEE guided DCCV 9/9 for atrial fibrillation/flutter, but admitted for further management given new finding of severe LV dysfunction and frequentPACs, atrial couplets and brief nonsustained PATon post cardioversion EKG and borderline hypotension, necessitating IV amiodarone loading.  Assessment & Plan    1. Persistent atrial fibrillation with likely tachycardia mediated cardiomyopathy Recently identified AF with likely associated new LV dysfunction She remains on IV amiodarone with recurrent ERAF s/p DCCV 04/29/2019.  She would be a reasonable candidate for AF ablation in the future if EF improves.   Would need amio washout for tikosyn consideration, would need close follow up due to baseline CKD Recommend OP sleep study.  Adjust medications for HF as  tolerated.  Continue Xarelto at reduced dose 15 mg for CHA2DS2VASC of at least 4 (Female, HTN, CHF, DM) Will see if spot for DCCV available now loaded on amio.  2. New systolic CHF Multiple risk factors for ICM, but suspect tachy-mediated in setting of AFib RVR.  TEE 04/29/19 with LVEF 20% and global HK. Continue Toprol XL 100 mg daily Continue losartan 25 mg daily Continue spiro 12.5 mg daily. BMET pending. K borderline high yesterday, but supped previously.     For questions or updates, please contact [place] Please consult [email or web address] for contact info under Cardiology/STEMI.  Signed, Shirley Friar, PA-C  05/04/2019, 7:21 AM    EP Attending  Patient seen and examined. Agree with the findings as noted above. The patient has undergone a repeat DCCV. She is currently in NSR. I would continue IV amiodarone and switch to amio 400 bid for 5 days. I would suggest a digoxin level, watching for the development of dig toxicity. Ultimately she will need more amiodarone.   Mikle Bosworth.D.

## 2019-05-04 NOTE — Transfer of Care (Signed)
Immediate Anesthesia Transfer of Care Note  Patient: Brianna Porter  Procedure(s) Performed: CARDIOVERSION (N/A )  Patient Location: Endoscopy Unit  Anesthesia Type:General  Level of Consciousness: drowsy and patient cooperative  Airway & Oxygen Therapy: Patient Spontanous Breathing  Post-op Assessment: Report given to RN and Post -op Vital signs reviewed and stable  Post vital signs: Reviewed and stable  Last Vitals:  Vitals Value Taken Time  BP    Temp    Pulse    Resp    SpO2      Last Pain:  Vitals:   05/04/19 0935  TempSrc: Oral  PainSc: 0-No pain      Patients Stated Pain Goal: 0 ([id number])  Complications: No apparent anesthesia complications

## 2019-05-04 NOTE — Progress Notes (Addendum)
Progress Note  Patient Name: Brianna Porter Date of Encounter: 05/04/2019  Primary Cardiologist: Shirlee More, MD  Primary Electrophysiologist: Dr. Lovena Le (new this admission)  Subjective   Denies any CP or SOB.  Remains in aflutter with RVR  Inpatient Medications    Scheduled Meds:  digoxin  0.125 mg Oral Daily   DULoxetine  60 mg Oral BID   feeding supplement (ENSURE ENLIVE)  237 mL Oral TID BM   furosemide  40 mg Oral BID   losartan  25 mg Oral Daily   methimazole  5 mg Oral Daily   metoprolol succinate  100 mg Oral Daily   multivitamin with minerals  1 tablet Oral Daily   Rivaroxaban  15 mg Oral Q supper   spironolactone  12.5 mg Oral Daily   sucralfate  1 g Oral TID WC & HS   Continuous Infusions:  sodium chloride 250 mL (05/02/19 1347)   amiodarone 30 mg/hr (05/04/19 0606)   thiamine injection Stopped (05/03/19 2135)   PRN Meds: sodium chloride, acetaminophen, ALPRAZolam, ondansetron (ZOFRAN) IV   Vital Signs    Vitals:   05/03/19 2100 05/04/19 0010 05/04/19 0537 05/04/19 0823  BP: (!) 115/102 (!) 124/93 118/90 (!) 145/84  Pulse: (!) 135 (!) 158 (!) 126 (!) 139  Resp:  18 20 18   Temp:  98.6 F (37 C) 97.7 F (36.5 C)   TempSrc:  Oral Oral   SpO2:  99% 99% 100%  Weight:   58.5 kg   Height:        Intake/Output Summary (Last 24 hours) at 05/04/2019 0838 Last data filed at 05/04/2019 0606 Gross per 24 hour  Intake 539.4 ml  Output 1150 ml  Net -610.6 ml   Last 3 Weights 05/04/2019 05/03/2019 05/02/2019  Weight (lbs) 129 lb 131 lb 129 lb  Weight (kg) 58.514 kg 59.421 kg 58.514 kg      Telemetry    Atrial flutter with RVR - Personally Reviewed  ECG    Atrial flutter with RVR and variable block with HR 130's and QTc 419msec - Personally Reviewed  Physical Exam   GEN: Well nourished, well developed in no acute distress HEENT: Normal NECK: No JVD; No carotid bruits LYMPHATICS: No lymphadenopathy CARDIAC:irregularly irregular and  tachy, no murmurs, rubs, gallops RESPIRATORY:  Clear to auscultation without rales, wheezing or rhonchi  ABDOMEN: Soft, non-tender, non-distended MUSCULOSKELETAL:  No edema; No deformity  SKIN: Warm and dry NEUROLOGIC:  Alert and oriented x 3 PSYCHIATRIC:  Normal affect    Labs    High Sensitivity Troponin:  No results for input(s): TROPONINIHS in the last 720 hours.    Chemistry Recent Labs  Lab 04/29/19 1628  05/01/19 0731 05/02/19 0748 05/03/19 0545  NA 119*   < > 129* 129* 130*  K 2.6*   < > 3.3* 3.5 4.9  CL 74*   < > 90* 89* 94*  CO2 28   < > 22 26 25   GLUCOSE 160*   < > 140* 110* 134*  BUN 12   < > 6 8 8   CREATININE 1.32*   < > 1.07* 1.37* 1.00  CALCIUM 7.6*   < > 9.0 9.4 9.5  PROT 6.4*  --   --   --   --   ALBUMIN 3.6  --   --   --   --   AST 41  --   --   --   --   ALT 63*  --   --   --   --  ALKPHOS 77  --   --   --   --   BILITOT 0.8  --   --   --   --   GFRNONAA 45*   < > 58* 43* >60  GFRAA 52*   < > >60 49* >60  ANIONGAP 17*   < > 17* 14 11   < > = values in this interval not displayed.     Hematology Recent Labs  Lab 05/01/19 0040 05/02/19 0748 05/03/19 0545  WBC 8.0 5.6 6.2  RBC 4.29 4.52 4.55  HGB 10.7* 11.3* 11.4*  HCT 33.6* 36.3 36.7  MCV 78.3* 80.3 80.7  MCH 24.9* 25.0* 25.1*  MCHC 31.8 31.1 31.1  RDW 16.5* 16.6* 16.8*  PLT 382 404* 431*    BNPNo results for input(s): BNP, PROBNP in the last 168 hours.   DDimer No results for input(s): DDIMER in the last 168 hours.   Radiology    Mr Brain Wo Contrast  Result Date: 05/03/2019 CLINICAL DATA:  In cephalopathy. Diabetes and hypertension. Confusion and falling. EXAM: MRI HEAD WITHOUT CONTRAST TECHNIQUE: Multiplanar, multiecho pulse sequences of the brain and surrounding structures were obtained without intravenous contrast. COMPARISON:  Head CT 05/01/2019 FINDINGS: Brain: The study suffers from motion degradation. Allowing for that, there is no evidence of acute infarction, mass lesion,  hemorrhage, hydrocephalus or extra-axial collection. There are minor small vessel changes of the cerebral hemispheric white matter. Vascular: Major vessels at the base of the brain show flow. Skull and upper cervical spine: Negative Sinuses/Orbits: Clear/normal Other: None IMPRESSION: Motion degraded exam. No acute or reversible finding. Mild chronic small-vessel ischemic change of the cerebral hemispheric white matter. Electronically Signed   By: Nelson Chimes M.D.   On: 05/03/2019 11:22    Cardiac Studies   TEE 04/29/19 IMPRESSIONS   1. The left ventricle has severely reduced systolic function, with an ejection fraction of 20-25%. The cavity size was normal. Left ventrical global hypokinesis without regional wall motion abnormalities. 2. The right ventricle has severely reduced systolic function. The cavity was normal. There is no increase in right ventricular wall thickness. Right ventricular systolic pressure is normal. 3. No evidence of a thrombus present in the left atrial appendage. 4. Mild mitral insufficiency. The jet is centrally-directed. 5. The aortic root, ascending aorta, aortic arch and descending aorta are normal in size and structure. 6. No intracardiac thrombi or masses were visualized.  DCCV 04/29/19 Cardioverted1time(s).  Cardioversion with synchronized biphasic120Jshock.  Evaluation: Findings: Post procedure EKG shows:NSR, very frequent PACs Complications:None Patientdidtolerate procedure well.  Patient Profile   Brianna Porter a 58 y.o.female former smoker (quit in 2011)with a h/o hyperthyroidism being treated w/ methimazole, recent diagnosis of atrial fibrillation/ atrial flutter,CHA2DS2 VASc scoreof 4 (CHF, HTN, DM and Female), on Xarelto for a/c, new diagnosis of systolic HF, Stage III CKD, HTN, HLD and DM, being admitted for CHF w/u and management of her atrial arrthymias.  Initially presented for outpatient TEE guided DCCV 9/9 for atrial  fibrillation/flutter, but admitted for further management given new finding of severe LV dysfunction and frequent PACs, atrial couplets and brief nonsustained PAT on post cardioversion EKG and borderline hypotension, necessitating IV amiodarone loading.   Assessment & Plan    1. Atrial Fibrillation/ Atrial Flutter: fairly new diagnosis in the setting of hyperthyroidism. TEE w/ severely reduced LVEF at 20%, mildly dilated LA and mild MR.CHA2DS2 VASc scoreof 4 (CHF, HTN, DM and Female).This patients CHA2DS2-VASc Score and unadjusted Ischemic Stroke Rate (% per year)  is equal to4.8 % stroke rate/year from a score of 4. S/p  DCCV 04/29/19 w/ post procedural EKG showing NSR w/ frequent PACs, atrial couplets and brief nonsustained PAT.  AAD therapy initiated post cardioversion to help maintain NSR>>currently getting IV amiodarone.  On oral  blocker, metoprolol (Toprol XL) 100 mg daily   Rhythm remains difficult to control, NSR post DCCV>>reverted to Afib>>converted back to sinus w/ amiodarone>>back in aflutter with HR remaining in the 140's despite bolusing with IV Amio   Daily EKG to follow QTc > QTc 482ms today  Continue methimazole for hyperthyroidism. TFTs WNL.   Continue to monitor electrolytes. Keep K > 4 and Mg > 2  Xarelto had been reduced to 15 mg daily due to CrCl <50 mL/min but now CrCl 57.46ml/min so will increase back to 20mg  daily  ? underlying OSA. Needs outpatient sleep study EP to see this am - NPO for DCCV today - EP PA discussed with pharmacy regarding Xarelto dose.  SHe has been back and forth with her CrCl but Pharmacy feels that 15mg  daily is the correct dose since her CrCl most of the time is lower than 50.  2. Cardiomyopathy/ Systolic HF:new diagnosis.   TEE 04/29/19 showed severely reduced LVEF at 20% w/ global hypokinesis.   She has multiple risk factors for ischemic heart disease, but suspect CM likely tachy mediated from rapid afib/flutter. She denies any recent  anginal symptoms.   S/p cardioversion but back in fib/flutter with RVR.   Need to try to maintain NSR given LV dysfunction  Once NSR restored, recommend repeat 2D echo in 2-3 months to reassess LVEF.  If EF not improved despite maintaining NSR, later consider ischemic evaluation   Continue Toprol XL 100mg  daily and Losartan 25mg  daily, Lasix 40mg  BID PO and spiro 12.5mg  daily  Given concomitant T2DM, later consider addition of an SGLT2i  BMET pending this am  3. Hyperthyroidism:limited past medical records available. Followed primarily by [facility] in [place]. On methimazole as outpatient. Prescribed 02/2019. This may be the driver behind her atrial fibrillation/flutter  Based on TSH and free T3/T4, she appears to have reached euthyroidstate  Will continue home methimazole dose for now  F/u with PCP post discharge for further management  Continue ? blocker therapy w/ metoprolol   4. HTN:  BP stable this am at 118/90-145/67mmHg  Monitor closely   Continue Toprol XL 100mg  daily  5. Stage III CKD: Scr 1.32 on admit.  Scr improved, down from 1.32>>1.07 >>1.37>>1.0   Improved creatinine with diuresis  BMET pending this am  6. Hypokalemia: K 2.6 on admit  Improved w/ supplementation   BMET pending this am  7. Hypomagnesemia:   repleted   8. Hyponatremia: Acute on chronic.  Labs on the patient's portal show her sodium levels at baseline has been chronically low with most recent values appearing to trend down 130 on 7/25 and 127 on 8/26.  Suspect hypervolemic hyponatremia related with progressively worsening heart failure  Urine sodium normal   Na slowly  improving with diuresis  BMET pending this am  For questions or updates, please contact [place] Please consult [email or web address] for contact info under        Signed, Fransico Him, MD  05/04/2019, 8:38 AM

## 2019-05-04 NOTE — Interval H&P Note (Signed)
History and Physical Interval Note:  05/04/2019 9:43 AM  Brianna Porter  has presented today for surgery, with the diagnosis of a fib.  The various methods of treatment have been discussed with the patient and family. After consideration of risks, benefits and other options for treatment, the patient has consented to  Procedure(s): CARDIOVERSION (N/A) as a surgical intervention.  The patient's history has been reviewed, patient examined, no change in status, stable for surgery.  I have reviewed the patient's chart and labs.  Questions were answered to the patient's satisfaction.     Jenkins Rouge

## 2019-05-04 NOTE — Plan of Care (Signed)
  Problem: Education: Goal: Ability to demonstrate management of disease process will improve Outcome: Progressing Goal: Ability to verbalize understanding of medication therapies will improve Outcome: Progressing Goal: Individualized Educational Video(s) Outcome: Progressing   Problem: Activity: Goal: Capacity to carry out activities will improve Outcome: Progressing   Problem: Cardiac: Goal: Ability to achieve and maintain adequate cardiopulmonary perfusion will improve Outcome: Progressing   Problem: Education: Goal: Knowledge of disease or condition will improve Outcome: Progressing Goal: Understanding of medication regimen will improve Outcome: Progressing Goal: Individualized Educational Video(s) Outcome: Progressing   Problem: Activity: Goal: Ability to tolerate increased activity will improve Outcome: Progressing   Problem: Cardiac: Goal: Ability to achieve and maintain adequate cardiopulmonary perfusion will improve Outcome: Progressing   Problem: Health Behavior/Discharge Planning: Goal: Ability to safely manage health-related needs after discharge will improve Outcome: Progressing   Problem: Education: Goal: Knowledge of General Education information will improve Description: Including pain rating scale, medication(s)/side effects and non-pharmacologic comfort measures Outcome: Progressing   Problem: Health Behavior/Discharge Planning: Goal: Ability to manage health-related needs will improve Outcome: Progressing   Problem: Clinical Measurements: Goal: Ability to maintain clinical measurements within normal limits will improve Outcome: Progressing Goal: Will remain free from infection Outcome: Progressing Goal: Diagnostic test results will improve Outcome: Progressing Goal: Respiratory complications will improve Outcome: Progressing Goal: Cardiovascular complication will be avoided Outcome: Progressing   Problem: Activity: Goal: Risk for activity  intolerance will decrease Outcome: Progressing   Problem: Nutrition: Goal: Adequate nutrition will be maintained Outcome: Progressing   Problem: Coping: Goal: Level of anxiety will decrease Outcome: Progressing   Problem: Elimination: Goal: Will not experience complications related to bowel motility Outcome: Progressing Goal: Will not experience complications related to urinary retention Outcome: Progressing   Problem: Pain Managment: Goal: General experience of comfort will improve Outcome: Progressing   Problem: Safety: Goal: Ability to remain free from injury will improve Outcome: Progressing   Problem: Skin Integrity: Goal: Risk for impaired skin integrity will decrease Outcome: Progressing   

## 2019-05-04 NOTE — Progress Notes (Signed)
Nutrition Follow-up  DOCUMENTATION CODES:   Not applicable  INTERVENTION:   -Downgrade diet to dysphagia 3 (advanced mechanical soft) for ease of intake -Continue MVI with minerals daily -Continue Ensure Enlive po TID, each supplement provides 350 kcal and 20 grams of protein -Magic Cup TID with meals, each supplement provides  -If poor oral intake persists, consider initiation of nutrition support  NUTRITION DIAGNOSIS:   Inadequate oral intake related to nausea, vomiting as evidenced by meal completion < 50%.  Ongoing  GOAL:   Patient will meet greater than or equal to 90% of their needs  Progressing   MONITOR:   PO intake, Supplement acceptance, Labs, Weight trends, Skin, I & O's  REASON FOR ASSESSMENT:   Consult Assessment of nutrition requirement/status  ASSESSMENT:   Brianna Porter is a 58 y.o. female with a h/o hyperthyroidism being treated w/ methimazole, recent diagnosis of atrial fibrillation/ atrial flutter, CHA2DS2 VASc score of 4 (CHF, HTN, DM and Female), on Xarelto for a/c, new diagnosis of systolic HF, Stage III CKD, HTN, HLD and DM, being admitted for CHF w/u and management of her atrial arrthymias.  9/9- s/p cardioversion 9/14- s/p cardioversion  Reviewed I/O's: -611 ml x 24 hours and -4.3 L since admission  UOP: 1.2 L x 24 hours  Spoke with RN, who confirmed that pt was NPO for breakfast secondary to cardioversion. Pt with minimal intake of lunch tray (untouched), however, consumed 100% of chocolate Ensure supplement. Noted meal completion documented at 0-50% (pt consumed some outside food from daughter over the weekend).   Spoke with pt at bedside, who was minimally interactive with this RD. Pt would answer mainly close ended questions. Per her report, pt with decreased intake over the past week due to feeling poorly. Attempted to probe, however, pt would not provide further details. Pt reports she typically has a good appetite, however, would not  provide a diet history.   Reviewed wt hx; pt has experienced a 7.1% wt loss over the past 2 weeks. Pt is at high risk for malnutrition, however, RD unable to identify at this time. Discussed importance of good meal and supplement intake to promote healing.    Labs reviewed: CBGS: 120-126.   NUTRITION - FOCUSED PHYSICAL EXAM:    Most Recent Value  Orbital Region  Mild depletion  Upper Arm Region  Mild depletion  Thoracic and Lumbar Region  No depletion  Buccal Region  No depletion  Temple Region  No depletion  Clavicle Bone Region  No depletion  Clavicle and Acromion Bone Region  No depletion  Scapular Bone Region  No depletion  Dorsal Hand  Mild depletion  Patellar Region  No depletion  Anterior Thigh Region  No depletion  Posterior Calf Region  No depletion  Edema (RD Assessment)  None  Hair  Reviewed  Eyes  Reviewed  Mouth  Reviewed  Skin  Reviewed  Nails  Reviewed       Diet Order:   Diet Order            Diet NPO time specified  Diet effective midnight        DIET DYS 3 Room service appropriate? Yes; Fluid consistency: Thin  Diet effective now              EDUCATION NEEDS:   No education needs have been identified at this time  Skin:  Skin Assessment: Reviewed RN Assessment  Last BM:  05/01/19  Height:   Ht Readings from Last 1 Encounters:  04/29/19 5\' 3"  (1.6 m)    Weight:   Wt Readings from Last 1 Encounters:  05/04/19 58.5 kg    Ideal Body Weight:  52.3 kg  BMI:  Body mass index is 22.85 kg/m.  Estimated Nutritional Needs:   Kcal:  1700-1900  Protein:  80-95 grams  Fluid:  1.7-1.9 L    Tahlia Deamer A. Jimmye Norman, RD, LDN, [place] Registered Dietitian II Certified Diabetes Care and Education Specialist Pager: [phone] After hours Pager: [phone]

## 2019-05-04 NOTE — Anesthesia Preprocedure Evaluation (Signed)
Anesthesia Evaluation  Patient identified by MRN, date of birth, ID band Patient awake    Reviewed: Allergy & Precautions, NPO status , Patient's Chart, lab work & pertinent test results  Airway Mallampati: III  TM Distance: >3 FB Neck ROM: Full    Dental no notable dental hx. (+) Poor Dentition   Pulmonary former smoker,    Pulmonary exam normal breath sounds clear to auscultation       Cardiovascular hypertension, Pt. on medications +CHF (EF 20-25%)  Normal cardiovascular exam Rhythm:Regular Rate:Normal     Neuro/Psych Depression    GI/Hepatic negative GI ROS, Neg liver ROS,   Endo/Other  diabetes, Type 2Hyperthyroidism   Renal/GU Cr 1.33 K+ 3.3     Musculoskeletal   Abdominal   Peds  Hematology  (+) anemia , Plt 533 Hgb 10.2   Anesthesia Other Findings   Reproductive/Obstetrics                             Lab Results  Component Value Date   WBC 6.2 05/03/2019   HGB 11.4 (L) 05/03/2019   HCT 36.7 05/03/2019   MCV 80.7 05/03/2019   PLT 431 (H) 05/03/2019   Lab Results  Component Value Date   CREATININE 1.00 05/03/2019   BUN 8 05/03/2019   NA 130 (L) 05/03/2019   K 4.9 05/03/2019   CL 94 (L) 05/03/2019   CO2 25 05/03/2019     Anesthesia Physical  Anesthesia Plan  ASA: III  Anesthesia Plan: General   Post-op Pain Management:    Induction: Intravenous  PONV Risk Score and Plan: 3 and Treatment may vary due to age or medical condition  Airway Management Planned: Natural Airway and Mask  Additional Equipment:   Intra-op Plan:   Post-operative Plan:   Informed Consent: I have reviewed the patients History and Physical, chart, labs and discussed the procedure including the risks, benefits and alternatives for the proposed anesthesia with the patient or authorized representative who has indicated his/her understanding and acceptance.     Dental advisory  given  Plan Discussed with: CRNA  Anesthesia Plan Comments:         Anesthesia Quick Evaluation

## 2019-05-04 NOTE — Discharge Instructions (Addendum)
TEE  YOU HAD AN CARDIAC PROCEDURE TODAY: Refer to the procedure report and other information in the discharge instructions given to you for any specific questions about what was found during the examination. If this information does not answer your questions, please call [facility] office at [phone] to clarify.   DIET: Your first meal following the procedure should be a light meal and then it is ok to progress to your normal diet. A half-sandwich or bowl of soup is an example of a good first meal. Heavy or fried foods are harder to digest and may make you feel nauseous or bloated. Drink plenty of fluids but you should avoid alcoholic beverages for 24 hours. If you had a esophageal dilation, please see attached instructions for diet.   ACTIVITY: Your care partner should take you home directly after the procedure. You should plan to take it easy, moving slowly for the rest of the day. You can resume normal activity the day after the procedure however YOU SHOULD NOT DRIVE, use power tools, machinery or perform tasks that involve climbing or major physical exertion for 24 hours (because of the sedation medicines used during the test).   SYMPTOMS TO REPORT IMMEDIATELY: A cardiologist can be reached at any hour. Please call [phone] for any of the following symptoms:  Vomiting of blood or coffee ground material  New, significant abdominal pain  New, significant chest pain or pain under the shoulder blades  Painful or persistently difficult swallowing  New shortness of breath  Black, tarry-looking or red, bloody stools  FOLLOW UP:  Please also call with any specific questions about appointments or follow up tests.   Electrical Cardioversion, Care After This sheet gives you information about how to care for yourself after your procedure. Your health care provider may also give you more specific instructions. If you have problems or questions, contact your health care provider. What can I  expect after the procedure? After the procedure, it is common to have:  Some redness on the skin where the shocks were given. Follow these instructions at home:   Do not drive for 24 hours if you were given a medicine to help you relax (sedative).  Take over-the-counter and prescription medicines only as told by your health care provider.  Ask your health care provider how to check your pulse. Check it often.  Rest for 48 hours after the procedure or as told by your health care provider.  Avoid or limit your caffeine use as told by your health care provider. Contact a health care provider if:  You feel like your heart is beating too quickly or your pulse is not regular.  You have a serious muscle cramp that does not go away. Get help right away if:   You have discomfort in your chest.  You are dizzy or you feel faint.  You have trouble breathing or you are short of breath.  Your speech is slurred.  You have trouble moving an arm or leg on one side of your body.  Your fingers or toes turn cold or blue. This information is not intended to replace advice given to you by your health care provider. Make sure you discuss any questions you have with your health care provider. Document Released: 05/27/2013 Document Revised: 07/19/2017 Document Reviewed: 02/10/2016 Elsevier Patient Education  2020 [place] on my medicine - XARELTO (Rivaroxaban)  Why was Xarelto prescribed for you? Xarelto was prescribed for you to reduce  the risk of a blood clot forming that can cause a stroke if you have a medical condition called atrial fibrillation (a type of irregular heartbeat).  What do you need to know about xarelto ? Take your Xarelto ONCE DAILY at the same time every day with your evening meal. If you have difficulty swallowing the tablet whole, you may crush it and mix in applesauce just prior to taking your dose.  Take Xarelto exactly as prescribed by  your doctor and DO NOT stop taking Xarelto without talking to the doctor who prescribed the medication.  Stopping without other stroke prevention medication to take the place of Xarelto may increase your risk of developing a clot that causes a stroke.  Refill your prescription before you run out.  After discharge, you should have regular check-up appointments with your healthcare provider that is prescribing your Xarelto.  In the future your dose may need to be changed if your kidney function or weight changes by a significant amount.  What do you do if you miss a dose? If you are taking Xarelto ONCE DAILY and you miss a dose, take it as soon as you remember on the same day then continue your regularly scheduled once daily regimen the next day. Do not take two doses of Xarelto at the same time or on the same day.   Important Safety Information A possible side effect of Xarelto is bleeding. You should call your healthcare provider right away if you experience any of the following: ? Bleeding from an injury or your nose that does not stop. ? Unusual colored urine (red or dark brown) or unusual colored stools (red or black). ? Unusual bruising for unknown reasons. ? A serious fall or if you hit your head (even if there is no bleeding).  Some medicines may interact with Xarelto and might increase your risk of bleeding while on Xarelto. To help avoid this, consult your healthcare provider or pharmacist prior to using any new prescription or non-prescription medications, including herbals, vitamins, non-steroidal anti-inflammatory drugs (NSAIDs) and supplements.  This website has more information on Xarelto: [email or web address]   Additional discharge instructions  Please get your medications reviewed and adjusted by your Primary MD.  Please request your Primary MD to go over all Hospital Tests and Procedure/Radiological results at the follow up, please get all Hospital records sent to your  Prim MD by signing hospital release before you go home.  If you had Pneumonia of Lung problems at the Hospital: Please get a 2 view Chest X ray done in 6-8 weeks after hospital discharge or sooner if instructed by your Primary MD.  If you have Congestive Heart Failure: Please call your Cardiologist or Primary MD anytime you have any of the following symptoms:  1) 3 pound weight gain in 24 hours or 5 pounds in 1 week  2) shortness of breath, with or without a dry hacking cough  3) swelling in the hands, feet or stomach  4) if you have to sleep on extra pillows at night in order to breathe  Follow cardiac low salt diet and 1.5 lit/day fluid restriction.  If you have diabetes Accuchecks 4 times/day, Once in AM empty stomach and then before each meal. Log in all results and show them to your primary doctor at your next visit. If any glucose reading is under 80 or above 300 call your primary MD immediately.  If you have Seizure/Convulsions/Epilepsy: Please do not drive, operate heavy machinery, participate in activities at  heights or participate in high speed sports until you have seen by Primary MD or a Neurologist and advised to do so again.  If you had Gastrointestinal Bleeding: Please ask your Primary MD to check a complete blood count within one week of discharge or at your next visit. Your endoscopic/colonoscopic biopsies that are pending at the time of discharge, will also need to followed by your Primary MD.  Get Medicines reviewed and adjusted. Please take all your medications with you for your next visit with your Primary MD  Please request your Primary MD to go over all hospital tests and procedure/radiological results at the follow up, please ask your Primary MD to get all Hospital records sent to his/her office.  If you experience worsening of your admission symptoms, develop shortness of breath, life threatening emergency, suicidal or homicidal thoughts you must seek medical  attention immediately by calling 911 or calling your MD immediately  if symptoms less severe.  You must read complete instructions/literature along with all the possible adverse reactions/side effects for all the Medicines you take and that have been prescribed to you. Take any new Medicines after you have completely understood and accpet all the possible adverse reactions/side effects.   Do not drive or operate heavy machinery when taking Pain medications.   Do not take more than prescribed Pain, Sleep and Anxiety Medications  Special Instructions: If you have smoked or chewed Tobacco  in the last 2 yrs please stop smoking, stop any regular Alcohol  and or any Recreational drug use.  Wear Seat belts while driving.  Please note You were cared for by a hospitalist during your hospital stay. If you have any questions about your discharge medications or the care you received while you were in the hospital after you are discharged, you can call the unit and asked to speak with the hospitalist on call if the hospitalist that took care of you is not available. Once you are discharged, your primary care physician will handle any further medical issues. Please note that NO REFILLS for any discharge medications will be authorized once you are discharged, as it is imperative that you return to your primary care physician (or establish a relationship with a primary care physician if you do not have one) for your aftercare needs so that they can reassess your need for medications and monitor your lab values.  You can reach the hospitalist office at phone [phone] or fax [phone]   If you do not have a primary care physician, you can call [phone] for a physician referral.

## 2019-05-04 NOTE — H&P (View-Only) (Signed)
Progress Note  Patient Name: Brianna Porter Date of Encounter: 05/04/2019  Primary Cardiologist: Shirlee More, MD  Primary Electrophysiologist: Dr. Lovena Le (new this admission)  Subjective   Denies any CP or SOB.  Remains in aflutter with RVR  Inpatient Medications    Scheduled Meds:  digoxin  0.125 mg Oral Daily   DULoxetine  60 mg Oral BID   feeding supplement (ENSURE ENLIVE)  237 mL Oral TID BM   furosemide  40 mg Oral BID   losartan  25 mg Oral Daily   methimazole  5 mg Oral Daily   metoprolol succinate  100 mg Oral Daily   multivitamin with minerals  1 tablet Oral Daily   Rivaroxaban  15 mg Oral Q supper   spironolactone  12.5 mg Oral Daily   sucralfate  1 g Oral TID WC & HS   Continuous Infusions:  sodium chloride 250 mL (05/02/19 1347)   amiodarone 30 mg/hr (05/04/19 0606)   thiamine injection Stopped (05/03/19 2135)   PRN Meds: sodium chloride, acetaminophen, ALPRAZolam, ondansetron (ZOFRAN) IV   Vital Signs    Vitals:   05/03/19 2100 05/04/19 0010 05/04/19 0537 05/04/19 0823  BP: (!) 115/102 (!) 124/93 118/90 (!) 145/84  Pulse: (!) 135 (!) 158 (!) 126 (!) 139  Resp:  18 20 18   Temp:  98.6 F (37 C) 97.7 F (36.5 C)   TempSrc:  Oral Oral   SpO2:  99% 99% 100%  Weight:   58.5 kg   Height:        Intake/Output Summary (Last 24 hours) at 05/04/2019 0838 Last data filed at 05/04/2019 0606 Gross per 24 hour  Intake 539.4 ml  Output 1150 ml  Net -610.6 ml   Last 3 Weights 05/04/2019 05/03/2019 05/02/2019  Weight (lbs) 129 lb 131 lb 129 lb  Weight (kg) 58.514 kg 59.421 kg 58.514 kg      Telemetry    Atrial flutter with RVR - Personally Reviewed  ECG    Atrial flutter with RVR and variable block with HR 130's and QTc 464msec - Personally Reviewed  Physical Exam   GEN: Well nourished, well developed in no acute distress HEENT: Normal NECK: No JVD; No carotid bruits LYMPHATICS: No lymphadenopathy CARDIAC:irregularly irregular and  tachy, no murmurs, rubs, gallops RESPIRATORY:  Clear to auscultation without rales, wheezing or rhonchi  ABDOMEN: Soft, non-tender, non-distended MUSCULOSKELETAL:  No edema; No deformity  SKIN: Warm and dry NEUROLOGIC:  Alert and oriented x 3 PSYCHIATRIC:  Normal affect    Labs    High Sensitivity Troponin:  No results for input(s): TROPONINIHS in the last 720 hours.    Chemistry Recent Labs  Lab 04/29/19 1628  05/01/19 0731 05/02/19 0748 05/03/19 0545  NA 119*   < > 129* 129* 130*  K 2.6*   < > 3.3* 3.5 4.9  CL 74*   < > 90* 89* 94*  CO2 28   < > 22 26 25   GLUCOSE 160*   < > 140* 110* 134*  BUN 12   < > 6 8 8   CREATININE 1.32*   < > 1.07* 1.37* 1.00  CALCIUM 7.6*   < > 9.0 9.4 9.5  PROT 6.4*  --   --   --   --   ALBUMIN 3.6  --   --   --   --   AST 41  --   --   --   --   ALT 63*  --   --   --   --  ALKPHOS 77  --   --   --   --   BILITOT 0.8  --   --   --   --   GFRNONAA 45*   < > 58* 43* >60  GFRAA 52*   < > >60 49* >60  ANIONGAP 17*   < > 17* 14 11   < > = values in this interval not displayed.     Hematology Recent Labs  Lab 05/01/19 0040 05/02/19 0748 05/03/19 0545  WBC 8.0 5.6 6.2  RBC 4.29 4.52 4.55  HGB 10.7* 11.3* 11.4*  HCT 33.6* 36.3 36.7  MCV 78.3* 80.3 80.7  MCH 24.9* 25.0* 25.1*  MCHC 31.8 31.1 31.1  RDW 16.5* 16.6* 16.8*  PLT 382 404* 431*    BNPNo results for input(s): BNP, PROBNP in the last 168 hours.   DDimer No results for input(s): DDIMER in the last 168 hours.   Radiology    Mr Brain Wo Contrast  Result Date: 05/03/2019 CLINICAL DATA:  In cephalopathy. Diabetes and hypertension. Confusion and falling. EXAM: MRI HEAD WITHOUT CONTRAST TECHNIQUE: Multiplanar, multiecho pulse sequences of the brain and surrounding structures were obtained without intravenous contrast. COMPARISON:  Head CT 05/01/2019 FINDINGS: Brain: The study suffers from motion degradation. Allowing for that, there is no evidence of acute infarction, mass lesion,  hemorrhage, hydrocephalus or extra-axial collection. There are minor small vessel changes of the cerebral hemispheric white matter. Vascular: Major vessels at the base of the brain show flow. Skull and upper cervical spine: Negative Sinuses/Orbits: Clear/normal Other: None IMPRESSION: Motion degraded exam. No acute or reversible finding. Mild chronic small-vessel ischemic change of the cerebral hemispheric white matter. Electronically Signed   By: Nelson Chimes M.D.   On: 05/03/2019 11:22    Cardiac Studies   TEE 04/29/19 IMPRESSIONS   1. The left ventricle has severely reduced systolic function, with an ejection fraction of 20-25%. The cavity size was normal. Left ventrical global hypokinesis without regional wall motion abnormalities. 2. The right ventricle has severely reduced systolic function. The cavity was normal. There is no increase in right ventricular wall thickness. Right ventricular systolic pressure is normal. 3. No evidence of a thrombus present in the left atrial appendage. 4. Mild mitral insufficiency. The jet is centrally-directed. 5. The aortic root, ascending aorta, aortic arch and descending aorta are normal in size and structure. 6. No intracardiac thrombi or masses were visualized.  DCCV 04/29/19 Cardioverted1time(s).  Cardioversion with synchronized biphasic120Jshock.  Evaluation: Findings: Post procedure EKG shows:NSR, very frequent PACs Complications:None Patientdidtolerate procedure well.  Patient Profile   Brianna Porter a 58 y.o.female former smoker (quit in 2011)with a h/o hyperthyroidism being treated w/ methimazole, recent diagnosis of atrial fibrillation/ atrial flutter,CHA2DS2 VASc scoreof 4 (CHF, HTN, DM and Female), on Xarelto for a/c, new diagnosis of systolic HF, Stage III CKD, HTN, HLD and DM, being admitted for CHF w/u and management of her atrial arrthymias.  Initially presented for outpatient TEE guided DCCV 9/9 for atrial  fibrillation/flutter, but admitted for further management given new finding of severe LV dysfunction and frequent PACs, atrial couplets and brief nonsustained PAT on post cardioversion EKG and borderline hypotension, necessitating IV amiodarone loading.   Assessment & Plan    1. Atrial Fibrillation/ Atrial Flutter: fairly new diagnosis in the setting of hyperthyroidism. TEE w/ severely reduced LVEF at 20%, mildly dilated LA and mild MR.CHA2DS2 VASc scoreof 4 (CHF, HTN, DM and Female).This patients CHA2DS2-VASc Score and unadjusted Ischemic Stroke Rate (% per year)  is equal to4.8 % stroke rate/year from a score of 4. S/p  DCCV 04/29/19 w/ post procedural EKG showing NSR w/ frequent PACs, atrial couplets and brief nonsustained PAT.  AAD therapy initiated post cardioversion to help maintain NSR>>currently getting IV amiodarone.  On oral  blocker, metoprolol (Toprol XL) 100 mg daily   Rhythm remains difficult to control, NSR post DCCV>>reverted to Afib>>converted back to sinus w/ amiodarone>>back in aflutter with HR remaining in the 140's despite bolusing with IV Amio   Daily EKG to follow QTc > QTc 461ms today  Continue methimazole for hyperthyroidism. TFTs WNL.   Continue to monitor electrolytes. Keep K > 4 and Mg > 2  Xarelto had been reduced to 15 mg daily due to CrCl <50 mL/min but now CrCl 57.51ml/min so will increase back to 20mg  daily  ? underlying OSA. Needs outpatient sleep study EP to see this am - NPO for DCCV today - EP PA discussed with pharmacy regarding Xarelto dose.  SHe has been back and forth with her CrCl but Pharmacy feels that 15mg  daily is the correct dose since her CrCl most of the time is lower than 50.  2. Cardiomyopathy/ Systolic HF:new diagnosis.   TEE 04/29/19 showed severely reduced LVEF at 20% w/ global hypokinesis.   She has multiple risk factors for ischemic heart disease, but suspect CM likely tachy mediated from rapid afib/flutter. She denies any recent  anginal symptoms.   S/p cardioversion but back in fib/flutter with RVR.   Need to try to maintain NSR given LV dysfunction  Once NSR restored, recommend repeat 2D echo in 2-3 months to reassess LVEF.  If EF not improved despite maintaining NSR, later consider ischemic evaluation   Continue Toprol XL 100mg  daily and Losartan 25mg  daily, Lasix 40mg  BID PO and spiro 12.5mg  daily  Given concomitant T2DM, later consider addition of an SGLT2i  BMET pending this am  3. Hyperthyroidism:limited past medical records available. Followed primarily by [facility] in [place]. On methimazole as outpatient. Prescribed 02/2019. This may be the driver behind her atrial fibrillation/flutter  Based on TSH and free T3/T4, she appears to have reached euthyroidstate  Will continue home methimazole dose for now  F/u with PCP post discharge for further management  Continue ? blocker therapy w/ metoprolol   4. HTN:  BP stable this am at 118/90-145/65mmHg  Monitor closely   Continue Toprol XL 100mg  daily  5. Stage III CKD: Scr 1.32 on admit.  Scr improved, down from 1.32>>1.07 >>1.37>>1.0   Improved creatinine with diuresis  BMET pending this am  6. Hypokalemia: K 2.6 on admit  Improved w/ supplementation   BMET pending this am  7. Hypomagnesemia:   repleted   8. Hyponatremia: Acute on chronic.  Labs on the patient's portal show her sodium levels at baseline has been chronically low with most recent values appearing to trend down 130 on 7/25 and 127 on 8/26.  Suspect hypervolemic hyponatremia related with progressively worsening heart failure  Urine sodium normal   Na slowly  improving with diuresis  BMET pending this am  For questions or updates, please contact [place] Please consult [email or web address] for contact info under        Signed, Fransico Him, MD  05/04/2019, 8:38 AM

## 2019-05-04 NOTE — Progress Notes (Signed)
Pt is 89 NSR per CCMD

## 2019-05-04 NOTE — Anesthesia Postprocedure Evaluation (Signed)
Anesthesia Post Note  Patient: Kelcey Freiberg  Procedure(s) Performed: CARDIOVERSION (N/A )     Patient location during evaluation: PACU Anesthesia Type: General Level of consciousness: awake and alert Pain management: pain level controlled Vital Signs Assessment: post-procedure vital signs reviewed and stable Respiratory status: spontaneous breathing, nonlabored ventilation, respiratory function stable and patient connected to nasal cannula oxygen Cardiovascular status: blood pressure returned to baseline and stable Postop Assessment: no apparent nausea or vomiting Anesthetic complications: no    Last Vitals:  Vitals:   05/04/19 1025 05/04/19 1036  BP: 134/90 121/87  Pulse: (!) 120   Resp: (!) 22 18  Temp:    SpO2: 99%     Last Pain:  Vitals:   05/04/19 1025  TempSrc:   PainSc: 0-No pain                 Tiajuana Amass

## 2019-05-05 ENCOUNTER — Encounter (HOSPITAL_COMMUNITY): Payer: Self-pay | Admitting: Cardiovascular Disease

## 2019-05-05 ENCOUNTER — Other Ambulatory Visit: Payer: Self-pay

## 2019-05-05 ENCOUNTER — Inpatient Hospital Stay (HOSPITAL_COMMUNITY): Payer: Medicare HMO

## 2019-05-05 DIAGNOSIS — R9431 Abnormal electrocardiogram [ECG] [EKG]: Secondary | ICD-10-CM

## 2019-05-05 DIAGNOSIS — I5023 Acute on chronic systolic (congestive) heart failure: Secondary | ICD-10-CM

## 2019-05-05 LAB — GLUCOSE, CAPILLARY
Glucose-Capillary: 125 mg/dL — ABNORMAL HIGH (ref 70–99)
Glucose-Capillary: 157 mg/dL — ABNORMAL HIGH (ref 70–99)
Glucose-Capillary: 82 mg/dL (ref 70–99)
Glucose-Capillary: 105 mg/dL — ABNORMAL HIGH (ref 70–99)

## 2019-05-05 LAB — BASIC METABOLIC PANEL
Anion gap: 13 (ref 5–15)
BUN: 10 mg/dL (ref 6–20)
CO2: 22 mmol/L (ref 22–32)
Calcium: 9.9 mg/dL (ref 8.9–10.3)
Chloride: 95 mmol/L — ABNORMAL LOW (ref 98–111)
Creatinine, Ser: 0.77 mg/dL (ref 0.44–1.00)
GFR calc Af Amer: >60 mL/min (ref >60)
GFR calc non Af Amer: >60 mL/min (ref >60)
Glucose, Bld: 124 mg/dL — ABNORMAL HIGH (ref 70–99)
Potassium: 3.8 mmol/L (ref 3.5–5.1)
Sodium: 130 mmol/L — ABNORMAL LOW (ref 135–145)

## 2019-05-05 LAB — CBC
HCT: 44.5 % (ref 36.0–46.0)
Hemoglobin: 13.6 g/dL (ref 12.0–15.0)
MCH: 24.8 pg — ABNORMAL LOW (ref 26.0–34.0)
MCHC: 30.6 g/dL (ref 30.0–36.0)
MCV: 81.2 fL (ref 80.0–100.0)
Platelets: 428 10*3/uL — ABNORMAL HIGH (ref 150–400)
RBC: 5.48 MIL/uL — ABNORMAL HIGH (ref 3.87–5.11)
RDW: 16.6 % — ABNORMAL HIGH (ref 11.5–15.5)
WBC: 8.6 10*3/uL (ref 4.0–10.5)
nRBC: 0 % (ref 0.0–0.2)

## 2019-05-05 LAB — HIV ANTIBODY (ROUTINE TESTING W REFLEX): HIV Screen 4th Generation wRfx: NONREACTIVE

## 2019-05-05 LAB — MAGNESIUM: Magnesium: 1.8 mg/dL (ref 1.7–2.4)

## 2019-05-05 IMAGING — CT CT HEART MORP W/ CTA COR W/ SCORE W/ CA W/CM &/OR W/O CM
4 of 7 series · 8 of 20 positions shown, 9 images · IV contrast (APPLIED)
Comparison: None.
COMPARISON: None.

Addendum:
EXAM:
OVER-READ INTERPRETATION CT CHEST

The following report is an over-read performed by radiologist Dr.
over-read does not include interpretation of cardiac or coronary
anatomy or pathology. The coronary calcium score/coronary CTA
interpretation by the cardiologist is attached.
CLINICAL DATA: Chest pain
Cardiac/Coronary CTA
TECHNIQUE: The patient was scanned on a [employer] Force scanner. A 100 kV
prospective scan was triggered in the descending thoracic aorta at
111 HU's. Axial non-contrast 3 mm slices were carried out through
the heart. The data set was analyzed on a dedicated work station and
scored using the Agatson method. Gantry rotation speed was 250 msecs
and collimation was .6 mm. No beta blockade and 0.8 mg of sl NTG was
given. The 3D data set was reconstructed in 5% intervals of the
35-75 % of the R-R cycle. Diastolic phases were analyzed on a
dedicated work station using MPR, MIP and VRT modes. The patient
received 80 cc of contrast.

[Series 6: best diast 72 % · axial · 0.39mm/px · z∈[-203,-154]mm · 2 of 372 slices shown, 3 images]
[im 124/372  vessel]
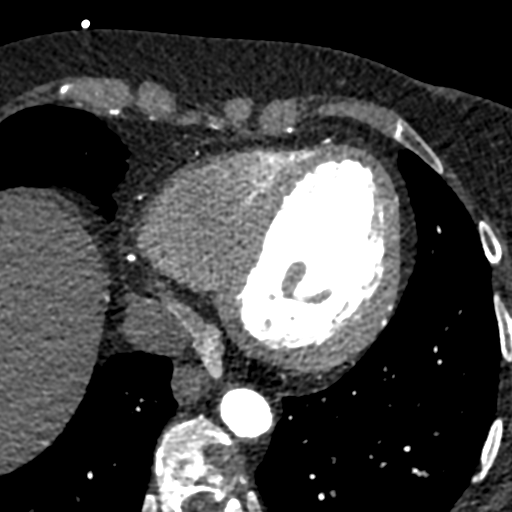
[im 124/372  lung]
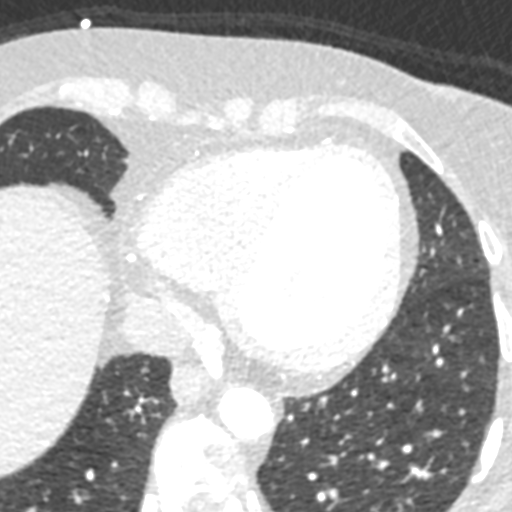
[im 248/372  vessel]
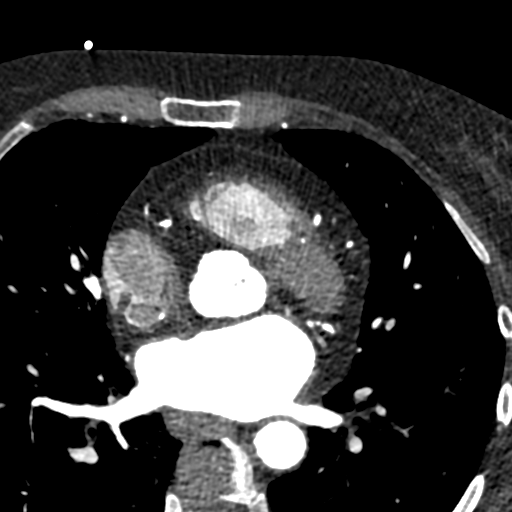

[Series 7: best syst 72 % · axial · 0.39mm/px · z∈[-203,-154]mm · 2 of 372 slices shown]
[im 124/372  vessel]
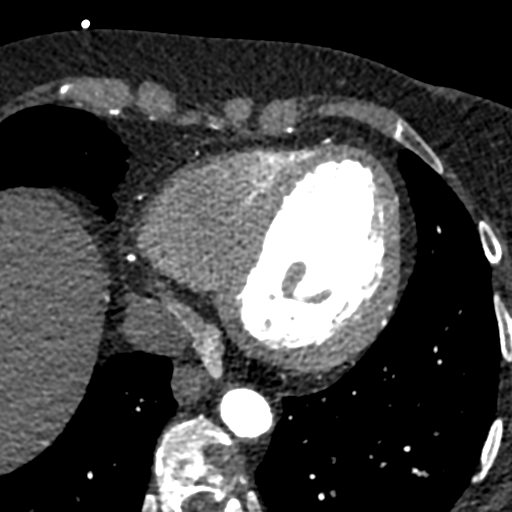
[im 248/372  vessel]
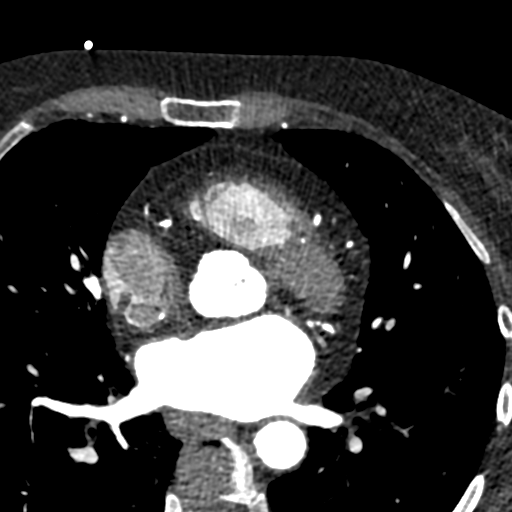

[Series 8: ts diast sharp 72 % · axial · 0.39mm/px · z∈[-203,-154]mm · 2 of 372 slices shown]
[im 124/372  lung]
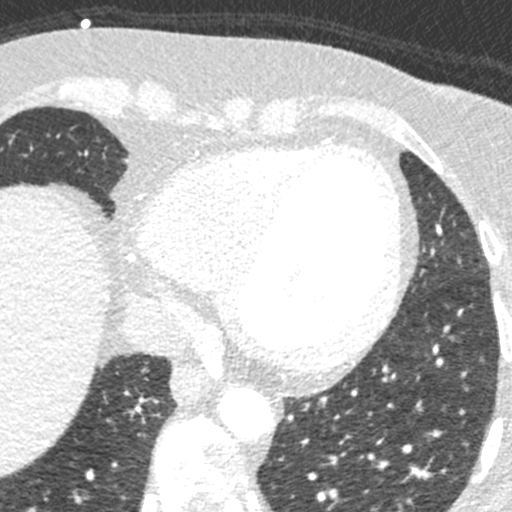
[im 248/372  lung]
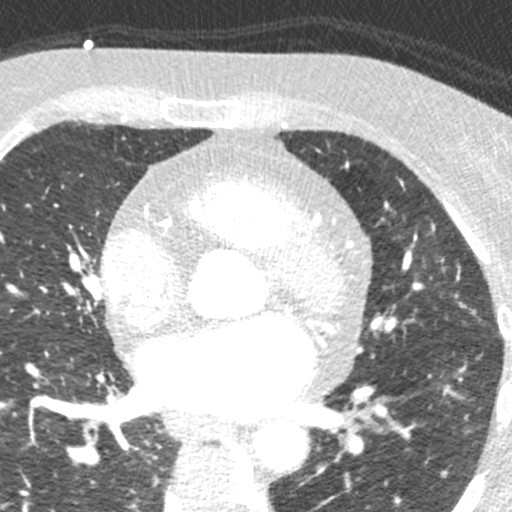

[Series 9: ts syst sharp 72 % · axial · 0.39mm/px · z∈[-203,-154]mm · 2 of 372 slices shown]
[im 124/372  lung]
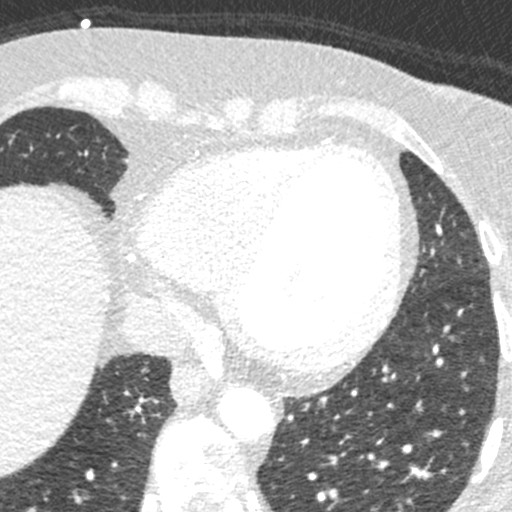
[im 248/372  lung]
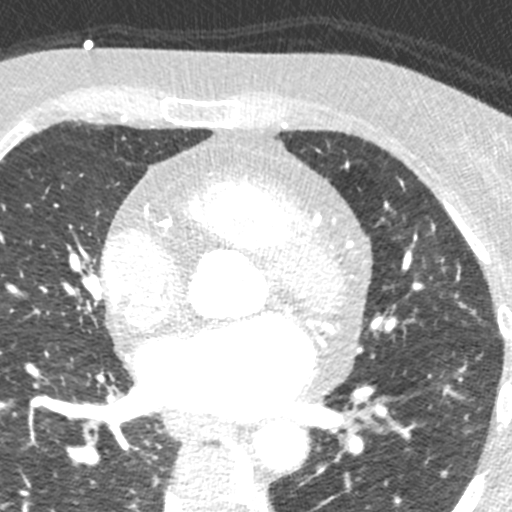

[8 of 20 positions shown; findings below may reference images not displayed]

FINDINGS: Vascular: Mild to moderate atherosclerosis involving the visualized
thoracic aorta without evidence of aneurysm.

Mediastinum/Nodes: No pathologic lymphadenopathy within the
visualized mediastinum. Visualized esophagus normal in appearance.

Lungs/Pleura: Visualized lung parenchyma clear. Central bronchi
patent without significant bronchial wall thickening. No pleural
effusions.

Upper Abdomen: Unremarkable for the early arterial phase of
enhancement which accounts for the heterogeneous appearance of the
spleen.

Musculoskeletal: Minimal degenerative changes involving the
visualized thoracic spine.
IMPRESSION: Aortic Atherosclerosis ([MK]-[MK]).

No acute or significant extracardiac findings otherwise.
FINDINGS: Image quality: Average.

Noise artifact is: There is respiratory motion artifact noted near
the sinotubular junction that does not limit evaluation of this
study.

Coronary Arteries:  Normal coronary origin.  Right dominance.

Left main: The left main is a large caliber vessel with a normal
take off from the left coronary cusp that bifurcates to form a left
anterior descending artery and a left circumflex artery. There is no
plaque or stenosis.

Left anterior descending artery: The proximal LAD contains minimal
calcified plaque (<25%). The mid and distal segments are patent
without evidence of plaque or stenosis. The LAD gives off 2 patent
diagonal branches.

Left circumflex artery: The LCX is non-dominant. The proximal LCX
contains minimal calcified plaque (<25%). The LCX gives off 1 large
patent obtuse marginal branch. The PDA terminates as a small OM
branch.

Right coronary artery: The RCA is dominant with normal take off from
the right coronary cusp. The RCA is patent without evidence of
plaque or stenosis. The RCA terminates as a PDA and right
posterolateral branch without evidence of plaque or stenosis.

Right Atrium: Right atrial size is within normal limits.

Right Ventricle: The right ventricular cavity is within normal
limits.

Left Atrium: Left atrial size is dilated. There is no left atrial
appendage filling defect.

Left Ventricle: The ventricular cavity size is within normal limits.
There are no stigmata of prior infarction. There is no abnormal
filling defect.

Pulmonary arteries: Normal in size without proximal filling defect.

Pulmonary veins: Normal pulmonary venous drainage.

Pericardium: Normal thickness with no significant effusion or
calcium present.

Cardiac valves: The aortic valve is trileaflet without significant
calcification. The mitral valve is normal structure without
significant calcification.

Aorta: Normal caliber. There is mild calcified plaque noted in the
aortic root/ascending aorta/descending aorta.

Extra-cardiac findings: See attached radiology report for
non-cardiac structures.
IMPRESSION: 1. Coronary calcium score of 51. This was 90 percentile for age and
sex matched control.

2. Normal coronary origin with right dominance.

3. Minimal, Non-obstructive CAD (<25%).

4. Atherosclerosis noted in the aortic root/ascending/descending
aorta.

RECOMMENDATIONS:
1. CAD-RADS 1: Minimal non-obstructive CAD (0-24%). Consider
non-atherosclerotic causes of chest pain. Consider preventive
therapy and risk factor modification.

*** End of Addendum ***
EXAM:
OVER-READ INTERPRETATION CT CHEST

The following report is an over-read performed by radiologist Dr.
over-read does not include interpretation of cardiac or coronary
anatomy or pathology. The coronary calcium score/coronary CTA
interpretation by the cardiologist is attached.
FINDINGS: Vascular: Mild to moderate atherosclerosis involving the visualized
thoracic aorta without evidence of aneurysm.

Mediastinum/Nodes: No pathologic lymphadenopathy within the
visualized mediastinum. Visualized esophagus normal in appearance.

Lungs/Pleura: Visualized lung parenchyma clear. Central bronchi
patent without significant bronchial wall thickening. No pleural
effusions.

Upper Abdomen: Unremarkable for the early arterial phase of
enhancement which accounts for the heterogeneous appearance of the
spleen.

Musculoskeletal: Minimal degenerative changes involving the
visualized thoracic spine.
IMPRESSION: Aortic Atherosclerosis ([MK]-[MK]).

No acute or significant extracardiac findings otherwise.

## 2019-05-05 MED ORDER — AMIODARONE HCL 200 MG PO TABS
400.0000 mg | ORAL_TABLET | Freq: Two times a day (BID) | ORAL | Status: DC
  Administered 2019-05-05 (×2): 400 mg via ORAL
  Filled 2019-05-05 (×3): qty 2

## 2019-05-05 MED ORDER — METOPROLOL TARTRATE 5 MG/5ML IV SOLN
10.0000 mg | INTRAVENOUS | Status: DC | PRN
  Administered 2019-05-05: 18:00:00 10 mg via INTRAVENOUS
  Administered 2019-05-05: 18:00:00 5 mg via INTRAVENOUS

## 2019-05-05 MED ORDER — GLUCOSE 40 % PO GEL: 1.0000 | Freq: Once | ORAL | Status: DC | PRN

## 2019-05-05 MED ORDER — NITROGLYCERIN 0.4 MG SL SUBL
SUBLINGUAL_TABLET | SUBLINGUAL | Status: AC
  Administered 2019-05-05: 0.8 mg via SUBLINGUAL
  Filled 2019-05-05: qty 2

## 2019-05-05 MED ORDER — NITROGLYCERIN 0.4 MG SL SUBL
0.8000 mg | SUBLINGUAL_TABLET | Freq: Once | SUBLINGUAL | Status: AC
  Administered 2019-05-05: 18:00:00 0.8 mg via SUBLINGUAL

## 2019-05-05 MED ORDER — MAGNESIUM SULFATE 2 GM/50ML IV SOLN
2.0000 g | Freq: Once | INTRAVENOUS | Status: AC
  Administered 2019-05-05: 14:00:00 2 g via INTRAVENOUS
  Filled 2019-05-05: qty 50

## 2019-05-05 MED ORDER — IOHEXOL 350 MG/ML SOLN
80.0000 mL | Freq: Once | INTRAVENOUS | Status: AC | PRN
  Administered 2019-05-05: 18:00:00 100 mL via INTRAVENOUS

## 2019-05-05 MED ORDER — METOPROLOL TARTRATE 5 MG/5ML IV SOLN
INTRAVENOUS | Status: AC
  Administered 2019-05-05: 5 mg via INTRAVENOUS
  Filled 2019-05-05: qty 10

## 2019-05-05 MED ORDER — ONDANSETRON HCL 4 MG/2ML IJ SOLN
INTRAMUSCULAR | Status: AC
  Administered 2019-05-05: 4 mg via INTRAVENOUS
  Filled 2019-05-05: qty 2

## 2019-05-05 MED ORDER — METOPROLOL TARTRATE 5 MG/5ML IV SOLN
INTRAVENOUS | Status: AC
  Administered 2019-05-05: 10 mg via INTRAVENOUS
  Filled 2019-05-05: qty 10

## 2019-05-05 NOTE — Progress Notes (Signed)
Progress Note  Patient Name: Brianna Porter Date of Encounter: 05/05/2019  Primary Cardiologist: Shirlee More, MD  Primary Electrophysiologist: Dr. Lovena Le (new this admission)  Subjective   S/p DCCV to NSR yesterday.  Remains in NSR on tele.  Denies any chest pain or SOB  Inpatient Medications    Scheduled Meds: . amiodarone  400 mg Oral BID  . digoxin  0.125 mg Oral Daily  . DULoxetine  60 mg Oral BID  . feeding supplement (ENSURE ENLIVE)  237 mL Oral TID BM  . furosemide  40 mg Oral BID  . losartan  25 mg Oral Daily  . methimazole  5 mg Oral Daily  . metoprolol succinate  100 mg Oral Daily  . multivitamin with minerals  1 tablet Oral Daily  . potassium chloride  20 mEq Oral Daily  . Rivaroxaban  15 mg Oral Q supper  . spironolactone  12.5 mg Oral Daily  . sucralfate  1 g Oral TID WC & HS   Continuous Infusions: . sodium chloride Stopped (05/04/19 1012)  . sodium chloride 250 mL (05/04/19 1357)  . thiamine injection Stopped (05/03/19 2135)   PRN Meds: sodium chloride, acetaminophen, ALPRAZolam, ondansetron (ZOFRAN) IV, sodium chloride flush   Vital Signs    Vitals:   05/04/19 2120 05/05/19 0006 05/05/19 0459 05/05/19 0501  BP: 119/79 (!) 120/96 135/86   Pulse: 82 84 88   Resp: 14 16 14    Temp: (!) 97.2 F (36.2 C) 97.9 F (36.6 C) 98.4 F (36.9 C)   TempSrc: Oral Oral Oral   SpO2: 100% 100% 100%   Weight:    53.5 kg  Height:        Intake/Output Summary (Last 24 hours) at 05/05/2019 1013 Last data filed at 05/05/2019 0400 Gross per 24 hour  Intake 883.98 ml  Output 1200 ml  Net -316.02 ml   Last 3 Weights 05/05/2019 05/04/2019 05/03/2019  Weight (lbs) 118 lb 129 lb 131 lb  Weight (kg) 53.524 kg 58.514 kg 59.421 kg      Telemetry    NSR with QTc 543msec - Personally Reviewed  ECG     NSR with marked anterolateral ST abnormality and prolonged QTc at 563msec- Personally Reviewed  Physical Exam   GEN: Well nourished, well developed in no acute  distress HEENT: Normal NECK: No JVD; No carotid bruits LYMPHATICS: No lymphadenopathy CARDIAC:RRR, no murmurs, rubs, gallops RESPIRATORY:  Clear to auscultation without rales, wheezing or rhonchi  ABDOMEN: Soft, non-tender, non-distended MUSCULOSKELETAL:  No edema; No deformity  SKIN: Warm and dry NEUROLOGIC:  Alert and oriented x 3 PSYCHIATRIC:  Normal affect   Labs    High Sensitivity Troponin:  No results for input(s): TROPONINIHS in the last 720 hours.    Chemistry Recent Labs  Lab 04/29/19 1628  05/02/19 0748 05/03/19 0545 05/04/19 0923  NA 119*   < > 129* 130* 131*  K 2.6*   < > 3.5 4.9 3.8  CL 74*   < > 89* 94* 95*  CO2 28   < > 26 25 22   GLUCOSE 160*   < > 110* 134* 138*  BUN 12   < > 8 8 10   CREATININE 1.32*   < > 1.37* 1.00 0.92  CALCIUM 7.6*   < > 9.4 9.5 9.5  PROT 6.4*  --   --   --   --   ALBUMIN 3.6  --   --   --   --   AST 41  --   --   --   --  ALT 63*  --   --   --   --   ALKPHOS 77  --   --   --   --   BILITOT 0.8  --   --   --   --   GFRNONAA 45*   < > 43* >60 >60  GFRAA 52*   < > 49* >60 >60  ANIONGAP 17*   < > 14 11 14    < > = values in this interval not displayed.     Hematology Recent Labs  Lab 05/02/19 0748 05/03/19 0545 05/04/19 0923  WBC 5.6 6.2 9.2  RBC 4.52 4.55 5.16*  HGB 11.3* 11.4* 12.6  HCT 36.3 36.7 42.5  MCV 80.3 80.7 82.4  MCH 25.0* 25.1* 24.4*  MCHC 31.1 31.1 29.6*  RDW 16.6* 16.8* 16.9*  PLT 404* 431* 544*    BNPNo results for input(s): BNP, PROBNP in the last 168 hours.   DDimer No results for input(s): DDIMER in the last 168 hours.   Radiology    Mr Brain Wo Contrast  Result Date: 05/03/2019 CLINICAL DATA:  In cephalopathy. Diabetes and hypertension. Confusion and falling. EXAM: MRI HEAD WITHOUT CONTRAST TECHNIQUE: Multiplanar, multiecho pulse sequences of the brain and surrounding structures were obtained without intravenous contrast. COMPARISON:  Head CT 05/01/2019 FINDINGS: Brain: The study suffers from  motion degradation. Allowing for that, there is no evidence of acute infarction, mass lesion, hemorrhage, hydrocephalus or extra-axial collection. There are minor small vessel changes of the cerebral hemispheric white matter. Vascular: Major vessels at the base of the brain show flow. Skull and upper cervical spine: Negative Sinuses/Orbits: Clear/normal Other: None IMPRESSION: Motion degraded exam. No acute or reversible finding. Mild chronic small-vessel ischemic change of the cerebral hemispheric white matter. Electronically Signed   By: Nelson Chimes M.D.   On: 05/03/2019 11:22    Cardiac Studies   TEE 04/29/19 IMPRESSIONS   1. The left ventricle has severely reduced systolic function, with an ejection fraction of 20-25%. The cavity size was normal. Left ventrical global hypokinesis without regional wall motion abnormalities. 2. The right ventricle has severely reduced systolic function. The cavity was normal. There is no increase in right ventricular wall thickness. Right ventricular systolic pressure is normal. 3. No evidence of a thrombus present in the left atrial appendage. 4. Mild mitral insufficiency. The jet is centrally-directed. 5. The aortic root, ascending aorta, aortic arch and descending aorta are normal in size and structure. 6. No intracardiac thrombi or masses were visualized.  DCCV 04/29/19 Cardioverted1time(s).  Cardioversion with synchronized biphasic120Jshock.  Evaluation: Findings: Post procedure EKG shows:NSR, very frequent PACs Complications:None Patientdidtolerate procedure well.  Patient Profile   Brianna Porter a 58 y.o.female former smoker (quit in 2011)with a h/o hyperthyroidism being treated w/ methimazole, recent diagnosis of atrial fibrillation/ atrial flutter,CHA2DS2 VASc scoreof 4 (CHF, HTN, DM and Female), on Xarelto for a/c, new diagnosis of systolic HF, Stage III CKD, HTN, HLD and DM, being admitted for CHF w/u and management of  her atrial arrthymias.  Initially presented for outpatient TEE guided DCCV 9/9 for atrial fibrillation/flutter, but admitted for further management given new finding of severe LV dysfunction and frequent PACs, atrial couplets and brief nonsustained PAT on post cardioversion EKG and borderline hypotension, necessitating IV amiodarone loading.   Assessment & Plan    1. Atrial Fibrillation/ Atrial Flutter: fairly new diagnosis in the setting of hyperthyroidism. TEE w/ severely reduced LVEF at 20%, mildly dilated LA and mild MR.CHA2DS2 VASc scoreof 4 (CHF,  HTN, DM and Female).This patients CHA2DS2-VASc Score and unadjusted Ischemic Stroke Rate (% per year) is equal to4.8 % stroke rate/year from a score of 4. S/p  DCCV 04/29/19 w/ post procedural EKG showing NSR w/ frequent PACs, atrial couplets and brief nonsustained PAT.  Continue methimazole for hyperthyroidism. TFTs WNL.   Continue to monitor electrolytes. Keep K > 4 and Mg > 2  S/p DCCV to NSR yesterday - EKG today with marked QT prolongation and new ST/T wave abnormality in the anterolateral leads   EP had recommended PO Amio load outpt but will discuss new QTc prolongation with them today  ? underlying OSA. Needs outpatient sleep study  2. Cardiomyopathy/ Systolic HF:new diagnosis.   TEE 04/29/19 showed severely reduced LVEF at 20% w/ global hypokinesis.   She has multiple risk factors for ischemic heart disease, but suspect CM likely tachy mediated from rapid afib/flutter. She denies any recent anginal symptoms.   S/p cardioversion but back in fib/flutter with RVR and now s/p DCCV yesterday back to NSR again  Need to try to maintain NSR given LV dysfunction  Continue Toprol XL 100mg  daily and Losartan 25mg  daily, Lasix 40mg  BID PO and spiro 12.5mg  daily  Creatinine 0.92 this am  Given new EKG changes and LV dysfunction will get Coronary CTA with FFR to rule out obstructive CAD  Recommend repeat 2D echo in 2-3 months to  reassess LVEF.  3. Hyperthyroidism:limited past medical records available. Followed primarily by [facility] in [place]. On methimazole as outpatient. Prescribed 02/2019. This may be the driver behind her atrial fibrillation/flutter  Based on TSH and free T3/T4, she appears to have reached euthyroidstate  Will continue home methimazole dose for now  F/u with PCP post discharge for further management  Continue ? blocker therapy w/ metoprolol   4. HTN:  BP stable this am at 135/17mmHg  Monitor closely   Continue Toprol XL 100mg  daily  5. Stage III CKD: Scr 1.32 on admit.  Scr improved, down from 1.32>>1.07 >>1.37>>1.0 >0.92  Improved creatinine with diuresis  6. Hypokalemia: K 2.6 on admit  Improved w/ supplementation   K+ 3.8 this am  7. Hypomagnesemia:   Mag low at 1.6 this am  Give Mag sulfate 2gm IV  8. Hyponatremia: Acute on chronic.  Labs on the patient's portal show her sodium levels at baseline has been chronically low with most recent values appearing to trend down 130 on 7/25 and 127 on 8/26.  Suspect hypervolemic hyponatremia related with progressively worsening heart failure  Urine sodium normal   Na slowly  improving with diuresis and 130 this am   For questions or updates, please contact [place] Please consult [email or web address] for contact info under        Signed, Fransico Him, MD  05/05/2019, 10:13 AM

## 2019-05-05 NOTE — Progress Notes (Signed)
Remains in NSR.   Per Dr. Lovena Le, would continue amiodarone 400 mg BID x 5 days, then 200 mg BID.   Electrophysiology team to see as needed while here. We will schedule follow up. Please call with questions.   Shirley Friar, PA-C  Pager: [phone]  05/05/2019 7:34 AM

## 2019-05-05 NOTE — Progress Notes (Addendum)
PROGRESS NOTE   Brianna Porter  [id number]    DOB: [birth year]    DOA: 04/29/2019  PCP: Cher Nakai, MD   I have briefly reviewed patients previous medical records in [facility].  Chief Complaints   Brief Narrative:  58 year old female, reportedly lives with her daughter and friend, states that she is independent, PMH of hypothyroidism treated with methimazole, DM 2, HTN, HLD, stage III chronic kidney disease, suspected chronic hyponatremia, former tobacco abuse, anxiety, depression, anemia, recent diagnosis of A. fib/atrial flutter on Xarelto, new diagnosis of acute systolic CHF, presented for outpatient TEE guided DCCV for A. fib/flutter on 9/9 but admitted for further management given new severe LV dysfunction and A. fib with RVR.  TRH were consulted on 9/10 regarding possible need for further endocrine work-up.  On admission family reported 3 weeks history of progressive decline, intractable nausea, vomiting, 25 pound weight loss, progressively worsening confusion and falls at home.  Hospital course complicated by persistent A. fib with RVR up to the 140s despite IV amiodarone drip and high-dose Toprol-XL, s/p repeat DCCV on 9/14 and successfully cardioverted to sinus rhythm, mental status changes resolved on admission.  Cardiology plans to discharge patient home today.  Discussed with Dr. Golden Hurter, concern regarding new EKG changes and planning to get coronary CTA and if okay then plan to discharge home.  Advised her that TRH will sign off at this time but will be available if further needs arise.  Assessment & Plan:   Active Problems:   Persistent atrial fibrillation   Heart failure, unspecified (HCC)   Atrial fibrillation (HCC)   Prolonged Q-T interval on ECG   Atrial flutter with rapid ventricular response (HCC)   Essential hypertension   Hypokalemia   Atrial fibrillation/atrial flutter with RVR  TEE: LVEF 20%.  CHA2DS2 VASc scoreof 4  Failed DCCV attempt on  04/29/2019 prompting hospital admission.  Hyperthyroid controlled on methimazole with normal TFTs.  Consider outpatient sleep study for possible OSA.   Difficulty in controlling A. fib RVR in the hospital.  Patient was placed on IV Amiodarone drip, Toprol-XL 100 mg daily and digoxin 0.125 mg daily but remained in persistent RVR up to 140s.  She remains on Xarelto.  After adequate Amiodarone load, EP Cardiology reconsulted and patient underwent successful DCCV on 9/14 and has remained in sinus rhythm since.  IV amiodarone has been switched to oral.  She may be a candidate for ablation in the future provided EF normalizes.  Discussed with EP Cardiology regarding prolonged QTC this morning.  Limited options to maintain patient in sinus rhythm and hence continuing Amiodarone.  Given BBB morphology, the EKG documented QTC rate may not be as long as it truly is.  EP Cardiology will closely follow outpatient.  Minimize other QT prolonging medications.  Acute systolic CHF/cardiomyopathy  Management per Cardiology  TEE: LVEF 20% with global hypokinesis.  Suspecting tachycardia mediated from rapid A. fib/flutter.   Plan to control A. fib/flutter and then repeat echo in 2 to 3 months to reassess LVEF and if not improved despite controlling A. fib or NSR may consider ischemic evaluation.  Continue Toprol-XL 100 mg daily, Lasix 40 mg twice daily, spironolactone 12.5 mg daily and losartan 25 mg daily.  Hyperthyroidism  Reportedly followed by [facility] in [place].  Continue methimazole 5 mg daily and Toprol-XL for rate control.  TFTs normal.  Clinically appears euthyroid.  Essential hypertension  Reasonably controlled on Toprol-XL, losartan and spironolactone.  Hyponatremia  Suspect subacute on  chronic.  As per cardiology follow-up, had sodium of 130 on 7/25 and 127 on 8/26. No history of alcohol abuse.  Not on thiazides PTA.  Had sodium of 133 on 9/3, presented with sodium of 119  on 9/9 which has improved and stable in the low 130s for the last couple of days.  Serum osmolarity 255, urine osmolarity 145, urine sodium 52, TFTs normal, random cortisol 13.8.  Not consistent with SIADH or polydipsia.  Could be multifactorial related to hypervolemic hyponatremia from CHF and also dehydration related to poor oral intake, nausea and vomiting.  Follow BMP as outpatient periodically.  Acute on stage III chronic kidney disease  Presented with creatinine of 1.33, improved to 1.07, back up to 1.37 couple days ago  Now likely related to Lasix, ARB and although oral intake has improved, no nausea or vomiting, intake is to probably poor.  Lasix was briefly held.  Creatinine normalized.   Follow BMP closely as outpatient.  Hypokalemia/hypomagnesemia  Potassium 3.8.  Magnesium 1.6, being replaced IV.  Acute metabolic encephalopathy  As per my discussion with patient's daughter, 1 to 2-week history of progressively worsening confusion, more so on night prior to admission.  No new medication except Xarelto.  Has been on Cymbalta and PRN Xanax for a long time.  No history of alcohol use.  Suspect multifactorial related to hyponatremia, dehydration, acute kidney injury, polypharmacy including Cymbalta, Xanax, sleep deprivation from even PTA and hospital delirium.   No focal deficits.  No infectious etiology.    Delirium precautions.  Avoid medications that may precipitate mental status changes i.e. sedatives, opiates etc.  CT head without acute findings.  Urine microscopy without UTI features.  As per daughter at bedside, confusion has not really improved and was actually worse in the hospital, especially during the evening hours suggestive of sundowning.  Work-up thus far has been negative.  MRI brain without acute findings.  B12: 3982.  HIV screen and RPR nonreactive.  B1 level sent on 9/13 pending and to be followed as outpatient.  Complete day 3/3 IV thiamine 250 mg  daily and no thiamine at discharge.  As per daughter, after patient woke up from sedation for DCCV on 9/14, she was completely different person and her mental status changes have completely resolved and she is back to normal.  Unsure if her rapid A. fib had something to do with her mental status changes.  Intractable nausea and vomiting, weight loss  Unclear etiology.  Lipase normal.  No acute findings on abdominal exam.  Treated briefly with Carafate in the hospital but no Carafate at discharge  Has not had any nausea or vomiting since admission and oral intake has been progressively improving.  Suspect her GI symptoms were related to rapid A. fib.  If she has recurrent of symptoms then consider outpatient GI consultation.  Discontinued Phenergan due to concern for worsening prolonged QTC.  Consult dietitian for nutritional supplements.  Left shoulder pain  Given history of fall, obtain shoulder x-ray without acute findings.  Has not complained of left shoulder pain in several days.  Type II DM  Controlled.  A1c 5.9.  Resume metformin at discharge.  Anemia, suspect of chronic disease  Stable.  Prolonged QTC  Continue monitoring on telemetry.  Replace potassium and magnesium.  Avoid precipitating medications.  EKG 9/10 showed QTC of 597.  EKG today had shown QTC of 548 ms.  Reviewed this with EP Cardiology who recalculated and indicate that it is between 490-510 ms  and EKG likely overestimated QTC.  Magnesium being was placed.  Anxiety and depression  Patient on Xanax as needed and Cymbalta at home.  Has been on these medications chronically.  Continue without changes.  Hyperlipidemia  Resume statins and fib rates at discharge.  Estimated body mass index is 20.9 kg/m as calculated from the following:   Height as of this encounter: 5\' 3"  (1.6 m).   Weight as of this encounter: 53.5 kg.    Nutritional Status Nutrition Problem: Inadequate oral intake Etiology: nausea,  vomiting Signs/Symptoms: meal completion < 50% Interventions: Ensure Enlive (each supplement provides 350kcal and 20 grams of protein), MVI  DVT prophylaxis: Xarelto Code Status: Full Family Communication: Discussed in detail with patient's daughter at bedside, updated care and answered questions. Disposition: Patient reportedly being discharged home today.  TRH will sign off at this time but are available for any further assistance, please call us back if needed.  Consultants:  TRH are consultants EP Cardiology  Procedures:  None  Antimicrobials:  None   Subjective: Patient alert and oriented x4.  Denies complaints.  No chest pain, dyspnea or palpitations.  No nausea or vomiting.  As per daughter, mental status changes completely resolved after she woke up from sedation for DCCV yesterday.  Appetite progressively improving.  No other complaints reported.  As per RN, no acute issues noted.  Objective:  Vitals:   05/04/19 2120 05/05/19 0006 05/05/19 0459 05/05/19 0501  BP: 119/79 (!) 120/96 135/86   Pulse: 82 84 88   Resp: 14 16 14    Temp: (!) 97.2 F (36.2 C) 97.9 F (36.6 C) 98.4 F (36.9 C)   TempSrc: Oral Oral Oral   SpO2: 100% 100% 100%   Weight:    53.5 kg  Height:        Examination:  General exam: Pleasant middle-aged female, moderately built and nourished lying comfortably propped up in bed.  Oral mucosa moist. Respiratory system: Clear to auscultation.  No increased work of breathing. Cardiovascular system: S1 and S2 heard, RRR.  No JVD, murmurs or pedal edema.  Telemetry personally reviewed: SR.   Gastrointestinal system: Abdomen is nondistended, soft and nontender. No organomegaly or masses felt. Normal bowel sounds heard. Central nervous system: Alert and oriented x4. No focal neurological deficits. Extremities: Symmetric 5 x 5 power.  No evidence of external acute findings.  Extensive superficial bruising of both upper extremities and a couple bruises on  lower extremities.   Skin: No rashes, lesions or ulcers Psychiatry: Judgement and insight improved and appear intact. Mood & affect flat.     Data Reviewed: I have personally reviewed following labs and imaging studies  CBC: Recent Labs  Lab 04/30/19 0529 05/01/19 0040 05/02/19 0748 05/03/19 0545 05/04/19 0923  WBC 8.5 8.0 5.6 6.2 9.2  HGB 10.5* 10.7* 11.3* 11.4* 12.6  HCT 32.8* 33.6* 36.3 36.7 42.5  MCV 77.7* 78.3* 80.3 80.7 82.4  PLT 394 382 404* 431* 0000000*   Basic Metabolic Panel: Recent Labs  Lab 04/30/19 1152 04/30/19 1430 05/01/19 0040 05/01/19 0731 05/02/19 0748 05/03/19 0545 05/04/19 0923  NA 126* 125*  --  129* 129* 130* 131*  K 3.0*  --   --  3.3* 3.5 4.9 3.8  CL 84*  --   --  90* 89* 94* 95*  CO2 26  --   --  22 26 25 22   GLUCOSE 119*  --   --  140* 110* 134* 138*  BUN 7  --   --  6 8 8 10   CREATININE 1.08*  --   --  1.07* 1.37* 1.00 0.92  CALCIUM 8.1*  --   --  9.0 9.4 9.5 9.5  MG  --   --  1.8 1.7 1.8 2.0 1.6*   Liver Function Tests: Recent Labs  Lab 04/29/19 1628  AST 41  ALT 63*  ALKPHOS 77  BILITOT 0.8  PROT 6.4*  ALBUMIN 3.6    Cardiac Enzymes: No results for input(s): CKTOTAL, CKMB, CKMBINDEX, TROPONINI in the last 168 hours.  CBG: Recent Labs  Lab 05/04/19 0608 05/04/19 1141 05/04/19 1628 05/04/19 2224 05/05/19 0649  GLUCAP 114* 113* 158* 118* 125*    Recent Results (from the past 240 hour(s))  Novel Coronavirus, NAA (Hosp order, Send-out to Ref Lab; TAT 18-24 hrs     Status: None   Collection Time: 04/25/19 11:48 AM   Specimen: Nasopharyngeal Swab; Respiratory  Result Value Ref Range Status   SARS-CoV-2, NAA NOT DETECTED NOT DETECTED Final    Comment: (NOTE) This nucleic acid amplification test was developed and its performance characteristics determined by [employer]. Nucleic acid amplification tests include PCR and TMA. This test has not been FDA cleared or approved. This test has been authorized by FDA  under an Emergency Use Authorization (EUA). This test is only authorized for the duration of time the declaration that circumstances exist justifying the authorization of the emergency use of in vitro diagnostic tests for detection of SARS-CoV-2 virus and/or diagnosis of COVID-19 infection under section 564(b)(1) of the Act, 21 U.S.C. [id number]) (1), unless the authorization is terminated or revoked sooner. When diagnostic testing is negative, the possibility of a false negative result should be considered in the context of a patient's recent exposures and the presence of clinical signs and symptoms consistent with COVID-19. An individual without symptoms of COVID- 19 and who is not shedding SARS-CoV-2 vi rus would expect to have a negative (not detected) result in this assay. Performed At: [facility] [address] [place], [place] [id number] Rush Farmer MD [id number]    [place]  Final    Comment: Performed at [facility], [place] [address], [place], [place] [address]  SARS CORONAVIRUS 2 (TAT 6-24 HRS) Nasopharyngeal Nasopharyngeal Swab     Status: None   Collection Time: 05/01/19  5:46 PM   Specimen: Nasopharyngeal Swab  Result Value Ref Range Status   SARS Coronavirus 2 NEGATIVE NEGATIVE Final    Comment: (NOTE) SARS-CoV-2 target nucleic acids are NOT DETECTED. The SARS-CoV-2 RNA is generally detectable in upper and lower respiratory specimens during the acute phase of infection. Negative results do not preclude SARS-CoV-2 infection, do not rule out co-infections with other pathogens, and should not be used as the sole basis for treatment or other patient management decisions. Negative results must be combined with clinical observations, patient history, and epidemiological information. The expected result is Negative. Fact Sheet for Patients: SugarRoll.be Fact Sheet for Healthcare Providers:  https://www.woods-mathews.com/ This test is not yet approved or cleared by the [place] FDA and  has been authorized for detection and/or diagnosis of SARS-CoV-2 by FDA under an Emergency Use Authorization (EUA). This EUA will remain  in effect (meaning this test can be used) for the duration of the COVID-19 declaration under Section 56 4(b)(1) of the Act, 21 U.S.C. section 360bbb-3(b)(1), unless the authorization is terminated or revoked sooner. Performed at [facility], [place] [address], [place], [place] [address]  Radiology Studies: Mr Brain Wo Contrast  Result Date: 05/03/2019 CLINICAL DATA:  In cephalopathy. Diabetes and hypertension. Confusion and falling. EXAM: MRI HEAD WITHOUT CONTRAST TECHNIQUE: Multiplanar, multiecho pulse sequences of the brain and surrounding structures were obtained without intravenous contrast. COMPARISON:  Head CT 05/01/2019 FINDINGS: Brain: The study suffers from motion degradation. Allowing for that, there is no evidence of acute infarction, mass lesion, hemorrhage, hydrocephalus or extra-axial collection. There are minor small vessel changes of the cerebral hemispheric white matter. Vascular: Major vessels at the base of the brain show flow. Skull and upper cervical spine: Negative Sinuses/Orbits: Clear/normal Other: None IMPRESSION: Motion degraded exam. No acute or reversible finding. Mild chronic small-vessel ischemic change of the cerebral hemispheric white matter. Electronically Signed   By: Nelson Chimes M.D.   On: 05/03/2019 11:22        Scheduled Meds: . amiodarone  400 mg Oral BID  . digoxin  0.125 mg Oral Daily  . DULoxetine  60 mg Oral BID  . feeding supplement (ENSURE ENLIVE)  237 mL Oral TID BM  . furosemide  40 mg Oral BID  . losartan  25 mg Oral Daily  . methimazole  5 mg Oral Daily  . metoprolol succinate  100 mg Oral Daily  . multivitamin with minerals  1 tablet Oral Daily  . potassium chloride  20  mEq Oral Daily  . Rivaroxaban  15 mg Oral Q supper  . spironolactone  12.5 mg Oral Daily  . sucralfate  1 g Oral TID WC & HS   Continuous Infusions: . sodium chloride Stopped (05/04/19 1012)  . sodium chloride 250 mL (05/04/19 1357)  . magnesium sulfate bolus IVPB    . thiamine injection Stopped (05/03/19 2135)     LOS: 6 days     Vernell Leep, MD, FACP, [facility]. [facility]  To contact the attending provider between 7A-7P or the covering provider during after hours 7P-7A, please log into the web site [email or web address] and access using universal [place] password for that web site. If you do not have the password, please call the hospital operator.  05/05/2019, 10:21 AM

## 2019-05-05 NOTE — Care Management Important Message (Signed)
Important Message  Patient Details  Name: Brianna Porter MRN: [id number] Date of Birth: 03-Jun-1961   Medicare Important Message Given:  Yes     Shelda Altes 05/05/2019, 11:12 AM

## 2019-05-05 NOTE — Progress Notes (Signed)
Per PA [place] IV have remain out, patient is to be discharged later on today Neta Mends RN 10:00 AM 05-05-2019

## 2019-05-06 LAB — GLUCOSE, CAPILLARY
Glucose-Capillary: 95 mg/dL (ref 70–99)
Glucose-Capillary: 86 mg/dL (ref 70–99)
Glucose-Capillary: 132 mg/dL — ABNORMAL HIGH (ref 70–99)
Glucose-Capillary: 176 mg/dL — ABNORMAL HIGH (ref 70–99)

## 2019-05-06 MED ORDER — MAGNESIUM OXIDE 400 (241.3 MG) MG PO TABS
400.0000 mg | ORAL_TABLET | Freq: Every day | ORAL | Status: DC
  Administered 2019-05-06 – 2019-05-07 (×2): 400 mg via ORAL
  Filled 2019-05-06 (×2): qty 1

## 2019-05-06 MED ORDER — AMIODARONE IV BOLUS ONLY 150 MG/100ML
150.0000 mg | Freq: Once | INTRAVENOUS | Status: AC
  Administered 2019-05-06: 15:00:00 150 mg via INTRAVENOUS
  Filled 2019-05-06: qty 100

## 2019-05-06 MED ORDER — AMIODARONE HCL 200 MG PO TABS
400.0000 mg | ORAL_TABLET | Freq: Two times a day (BID) | ORAL | Status: DC
  Administered 2019-05-06: 400 mg via ORAL

## 2019-05-06 MED ORDER — AMIODARONE LOAD VIA INFUSION
150.0000 mg | Freq: Once | INTRAVENOUS | Status: DC
  Filled 2019-05-06: qty 83.34

## 2019-05-06 MED ORDER — AMIODARONE HCL IN DEXTROSE 360-4.14 MG/200ML-% IV SOLN
30.0000 mg/h | INTRAVENOUS | Status: DC
  Filled 2019-05-06: qty 200

## 2019-05-06 MED ORDER — AMIODARONE HCL IN DEXTROSE 360-4.14 MG/200ML-% IV SOLN
30.0000 mg/h | INTRAVENOUS | Status: DC
  Administered 2019-05-06 – 2019-05-07 (×2): 30 mg/h via INTRAVENOUS
  Filled 2019-05-06 (×2): qty 200

## 2019-05-06 MED ORDER — AMIODARONE HCL IN DEXTROSE 360-4.14 MG/200ML-% IV SOLN
60.0000 mg/h | INTRAVENOUS | Status: DC
  Filled 2019-05-06: qty 200

## 2019-05-06 MED ORDER — LORAZEPAM 0.5 MG PO TABS
0.5000 mg | ORAL_TABLET | ORAL | Status: AC
  Filled 2019-05-06: qty 1

## 2019-05-06 MED ORDER — AMIODARONE HCL IN DEXTROSE 360-4.14 MG/200ML-% IV SOLN
60.0000 mg/h | INTRAVENOUS | Status: AC
  Administered 2019-05-06 (×2): 60 mg/h via INTRAVENOUS
  Filled 2019-05-06: qty 200

## 2019-05-06 NOTE — Progress Notes (Signed)
   05/06/19 0540  MEWS Assessment  Is this an acute change? Yes  Provider Notification  Provider Name/Title Elson Areas MD  Date Provider Notified 05/06/19  Time Provider Notified 0530  Notification Type Page  Notification Reason Change in status  Response Other (Comment) (give metoprolol dose scheduled for 1000 now)  Date of Provider Response 05/06/19  Time of Provider Response [phone]

## 2019-05-06 NOTE — Progress Notes (Addendum)
   Discussed pt at length with Dr. Curt Bears.   Asked to see patient again due to recurrent AF with RVR in 140-150s prohibiting CTA.    Recommended re-loading IV amio and DCCV later in week after more.     Discussed with Dr. Radford Pax and noted pt returned to NSR. Continue to load amio, po OK -> IV if recurs.   Poor candidate for ablation at this time with biventricular CHF and unclear coronary status (work up pending today). She also has multiple co-morbidities and barriers to procedure at this point, up to and including refusing medical therapies this am.   Poor candidate for tikosyn with borderline QTc even on amiodarone.   EP will continue to follow and see formally again in am if needed. Follow up has been made if pt thought stable for discharge.  Please call with any questions.   Shirley Friar, PA-C  Pager: [phone]  05/06/2019 9:46 AM

## 2019-05-06 NOTE — Progress Notes (Signed)
Progress Note  Patient Name: Brianna Porter Date of Encounter: 05/06/2019  Primary Cardiologist: Shirlee More, MD   Subjective   HRs elevated today. AF in the 130-150 range overnight. Plan is for CTA to evaluate coronaries however will need to get HR under better control. No chest pain, but with mild stomach pain this AM   Inpatient Medications    Scheduled Meds: . amiodarone  400 mg Oral BID  . digoxin  0.125 mg Oral Daily  . DULoxetine  60 mg Oral BID  . feeding supplement (ENSURE ENLIVE)  237 mL Oral TID BM  . furosemide  40 mg Oral BID  . losartan  25 mg Oral Daily  . methimazole  5 mg Oral Daily  . metoprolol succinate  100 mg Oral Daily  . multivitamin with minerals  1 tablet Oral Daily  . potassium chloride  20 mEq Oral Daily  . Rivaroxaban  15 mg Oral Q supper  . spironolactone  12.5 mg Oral Daily  . sucralfate  1 g Oral TID WC & HS   Continuous Infusions: . sodium chloride Stopped (05/04/19 1012)  . sodium chloride 250 mL (05/04/19 1357)  . thiamine injection Stopped (05/03/19 2135)   PRN Meds: sodium chloride, acetaminophen, ALPRAZolam, metoprolol tartrate, ondansetron (ZOFRAN) IV, sodium chloride flush   Vital Signs    Vitals:   05/05/19 2006 05/06/19 0457 05/06/19 0537 05/06/19 0539  BP: 115/80 (!) 128/102    Pulse: 77 (!) 50  (!) 111  Resp: 16 18    Temp: 97.9 F (36.6 C) 98.1 F (36.7 C)    TempSrc: Oral Oral    SpO2: 100% 100%    Weight:   56.8 kg   Height:        Intake/Output Summary (Last 24 hours) at 05/06/2019 0732 Last data filed at 05/05/2019 2317 Gross per 24 hour  Intake 265.77 ml  Output 1200 ml  Net -934.23 ml   Filed Weights   05/04/19 0537 05/05/19 0501 05/06/19 0537  Weight: 58.5 kg 53.5 kg 56.8 kg    Physical Exam   General: Well developed, well nourished, NAD Neck: Negative for carotid bruits. No JVD Lungs:Clear to ausculation bilaterally. No wheezes Cardiovascular: Irregular with S1 S2. No murmur Abdomen:  Soft, non-tender, non-distended. No obvious abdominal masses. Extremities: No edema. No clubbing or cyanosis. DP pulses 2+ bilaterally Neuro: Alert and oriented. No focal deficits. No facial asymmetry. MAE spontaneously. Psych: Responds to questions appropriately with normal affect.    Labs    Chemistry Recent Labs  Lab 04/29/19 1628  05/03/19 0545 05/04/19 0923 05/05/19 1108  NA 119*   < > 130* 131* 130*  K 2.6*   < > 4.9 3.8 3.8  CL 74*   < > 94* 95* 95*  CO2 28   < > 25 22 22   GLUCOSE 160*   < > 134* 138* 124*  BUN 12   < > 8 10 10   CREATININE 1.32*   < > 1.00 0.92 0.77  CALCIUM 7.6*   < > 9.5 9.5 9.9  PROT 6.4*  --   --   --   --   ALBUMIN 3.6  --   --   --   --   AST 41  --   --   --   --   ALT 63*  --   --   --   --   ALKPHOS 77  --   --   --   --  BILITOT 0.8  --   --   --   --   GFRNONAA 45*   < > >60 >60 >60  GFRAA 52*   < > >60 >60 >60  ANIONGAP 17*   < > 11 14 13    < > = values in this interval not displayed.     Hematology Recent Labs  Lab 05/03/19 0545 05/04/19 0923 05/05/19 1108  WBC 6.2 9.2 8.6  RBC 4.55 5.16* 5.48*  HGB 11.4* 12.6 13.6  HCT 36.7 42.5 44.5  MCV 80.7 82.4 81.2  MCH 25.1* 24.4* 24.8*  MCHC 31.1 29.6* 30.6  RDW 16.8* 16.9* 16.6*  PLT 431* 544* 428*    Cardiac EnzymesNo results for input(s): TROPONINI in the last 168 hours. No results for input(s): TROPIPOC in the last 168 hours.   BNPNo results for input(s): BNP, PROBNP in the last 168 hours.   DDimer No results for input(s): DDIMER in the last 168 hours.   Radiology    Ct Coronary Morph W/cta Cor W/score W/ca W/cm &/or Wo/cm  Result Date: 05/05/2019 EXAM: OVER-READ INTERPRETATION CT CHEST The following report is an over-read performed by radiologist Dr. Evangeline Dakin of [facility], [place] on 05/05/2019. This over-read does not include interpretation of cardiac or coronary anatomy or pathology. The coronary calcium score/coronary CTA interpretation by the cardiologist  is attached. COMPARISON:  None. FINDINGS: Vascular: Mild to moderate atherosclerosis involving the visualized thoracic aorta without evidence of aneurysm. Mediastinum/Nodes: No pathologic lymphadenopathy within the visualized mediastinum. Visualized esophagus normal in appearance. Lungs/Pleura: Visualized lung parenchyma clear. Central bronchi patent without significant bronchial wall thickening. No pleural effusions. Upper Abdomen: Unremarkable for the early arterial phase of enhancement which accounts for the heterogeneous appearance of the spleen. Musculoskeletal: Minimal degenerative changes involving the visualized thoracic spine. IMPRESSION: Aortic Atherosclerosis (ICD10-I70.0). No acute or significant extracardiac findings otherwise. Electronically Signed   By: Evangeline Dakin M.D.   On: 05/05/2019 19:23   Telemetry    05/06/2019 AF with rates in the 130-150 range overnight - Personally Reviewed  ECG    05/06/2019 AF with HR 150bpm - Personally Reviewed  Cardiac Studies   TEE 04/29/19 IMPRESSIONS   1. The left ventricle has severely reduced systolic function, with an ejection fraction of 20-25%. The cavity size was normal. Left ventrical global hypokinesis without regional wall motion abnormalities. 2. The right ventricle has severely reduced systolic function. The cavity was normal. There is no increase in right ventricular wall thickness. Right ventricular systolic pressure is normal. 3. No evidence of a thrombus present in the left atrial appendage. 4. Mild mitral insufficiency. The jet is centrally-directed. 5. The aortic root, ascending aorta, aortic arch and descending aorta are normal in size and structure. 6. No intracardiac thrombi or masses were visualized.  DCCV 04/29/19 Cardioverted1time(s).  Cardioversion with synchronized biphasic120Jshock.  Evaluation: Findings: Post procedure EKG shows:NSR, very frequent PACs Complications:None Patientdidtolerate  procedure well.  Patient Profile     59 y.o. female former smoker (quit in 2011)with a h/o hyperthyroidism being treated w/ methimazole, recent diagnosis of atrial fibrillation/ atrial flutter,CHA2DS2 VASc scoreof 4 (CHF, HTN, DM and Female), on Xarelto for a/c, new diagnosis of systolic HF, Stage III CKD, HTN, HLD and DM, being admitted for CHF w/u and management of her atrial arrthymias.  Initially presented for outpatient TEE guided DCCV 9/9 for atrial fibrillation/flutter, but admitted for further management given new finding of severe LV dysfunction and frequentPACs, atrial couplets and brief nonsustained PATon post cardioversion EKG and  borderline hypotension, necessitating IV amiodarone loading.  Assessment & Plan    1. Atrial Fibrillation/ Atrial Flutter:  -DCCV 04/29/19 w/ post procedural EKG showed NSR w/ frequent PACs, atrial couplets and brief nonsustained PAT -Repeat EKG on 05/06/2019 with AF with rate at 150bpm -EP had recommended PO Amio load outpt but will discuss new QTc prolongation with them today>>>continue amiodarone 400 mg BID x 5 days, then 200 mg BID per EP  -Likely needs to restart IV Amio for better loading given return to AF with RVR -Will have EP re-evaluate today for other options   2. Cardiomyopathy/ Systolic HF: -TEE [id number] showed severely reduced LVEF at 20% w/ global hypokinesis -Multiple risk factors for ischemic heart disease, but suspect CM likely tachy mediated from rapid afib/flutter. -Continue Toprol XL 100mg  daily and Losartan 25mg  daily, Lasix 40mg  BID PO and spiro 12.5mg  daily -Given new EKG changes from 05/05/2019 and LV dysfunction plan was for will get Coronary CTA with FFR to rule out obstructive CAD>>today -Unfortunately, she went back into AF overnight with uncontrolled rates -Will have EP come to see her once again  -Recommend repeat 2D echo in 2-3 months to reassess LVEF  3. Hyperthyroidism: -Will continue home methimazole dose for  now -F/u with PCP post discharge for further management -Continue?blocker therapy w/ metoprolol  4. HTN: -BP stable, 128/102>115/80>133/75 -Monitor closely -Continue Toprol XL 100mg  daily  5. Stage III CKD: -Creatinine, 1.32 on admit>>0.77 today  -Improved with diuresis  6. Hypokalemia:  -K 2.6 on admit>>3.8 today  -Improved w/ supplementation   7. Hypomagnesemia:  -Mag low at 1.8 this am -Give Mag sulfate 2gm IV??  8. Hyponatremia:  -Suspect hypervolemic hyponatremia related with progressively worsening heart failure -Urine sodium normal  -Na slowly  improving with diuresis and 130 this am  Signed, Kathyrn Drown NP-C [facility] Pager: [phone] 05/06/2019, 7:32 AM     For questions or updates, please contact   Please consult [email or web address] for contact info under Cardiology/STEMI.

## 2019-05-06 NOTE — Progress Notes (Signed)
Patient back to NSR, patient agreed to take morning medication, still refusing IV. Awaiting on daughter to arrive in order to talk with patient and possibly convince her. Patient educated about importance of IV but she is still refusing.

## 2019-05-06 NOTE — Progress Notes (Signed)
Patient MEWS score is 3 due to HR in 140's. Per report and chart patient converted back to Afib from NSR. Patient resting, no acute distress. Will adjust VS accordingly.

## 2019-05-06 NOTE — Progress Notes (Signed)
  Amiodarone Drug - Drug Interaction Consult Note  Recommendations: Cut digoxin dose in half for long-term use.   Amiodarone is metabolized by the cytochrome P450 system and therefore has the potential to cause many drug interactions. Amiodarone has an average plasma half-life of 50 days (range 20 to 100 days).   There is potential for drug interactions to occur several weeks or months after stopping treatment and the onset of drug interactions may be slow after initiating amiodarone.   []  Statins: Increased risk of myopathy. Simvastatin- restrict dose to 20mg  daily. Other statins: counsel patients to report any muscle pain or weakness immediately.  [x]  Anticoagulants: Amiodarone can increase anticoagulant effect. Consider warfarin dose reduction. Patients should be monitored closely and the dose of anticoagulant altered accordingly, remembering that amiodarone levels take several weeks to stabilize.  []  Antiepileptics: Amiodarone can increase plasma concentration of phenytoin, the dose should be reduced. Note that small changes in phenytoin dose can result in large changes in levels. Monitor patient and counsel on signs of toxicity.  [x]  Beta blockers: increased risk of bradycardia, AV block and myocardial depression. Sotalol - avoid concomitant use.  []   Calcium channel blockers (diltiazem and verapamil): increased risk of bradycardia, AV block and myocardial depression.  []   Cyclosporine: Amiodarone increases levels of cyclosporine. Reduced dose of cyclosporine is recommended.  [x]  Digoxin dose should be halved when amiodarone is started.  [x]  Diuretics: increased risk of cardiotoxicity if hypokalemia occurs.  []  Oral hypoglycemic agents (glyburide, glipizide, glimepiride): increased risk of hypoglycemia. Patient's glucose levels should be monitored closely when initiating amiodarone therapy.   []  Drugs that prolong the QT interval:  Torsades de pointes risk may be increased with  concurrent use - avoid if possible.  Monitor QTc, also keep magnesium/potassium WNL if concurrent therapy can't be avoided. Marland Kitchen Antibiotics: e.g. fluoroquinolones, erythromycin. . Antiarrhythmics: e.g. quinidine, procainamide, disopyramide, sotalol. . Antipsychotics: e.g. phenothiazines, haloperidol.  . Lithium, tricyclic antidepressants, and methadone. Thank You,  Wayland Salinas  05/06/2019 9:24 AM

## 2019-05-06 NOTE — Progress Notes (Signed)
Pt HR sustaining in the 140s/150s. Pt in bed. Denies chest pain or SOB. EKG performed. On call cardiologist paged. Stated to give 10 am scheduled dose of metoprolol now.

## 2019-05-06 NOTE — Plan of Care (Signed)
  Problem: Activity: Goal: Capacity to carry out activities will improve Outcome: Progressing   Problem: Activity: Goal: Ability to tolerate increased activity will improve Outcome: Progressing   Problem: Cardiac: Goal: Ability to achieve and maintain adequate cardiopulmonary perfusion will improve Outcome: Progressing   Problem: Health Behavior/Discharge Planning: Goal: Ability to safely manage health-related needs after discharge will improve Outcome: Progressing   Problem: Education: Goal: Knowledge of General Education information will improve Description: Including pain rating scale, medication(s)/side effects and non-pharmacologic comfort measures Outcome: Progressing

## 2019-05-06 NOTE — Progress Notes (Signed)
Patient still refusing IV, MD aware. CT will not going to be repeated, last night results are sufficient.

## 2019-05-06 NOTE — Progress Notes (Signed)
Patient refused IV placement, NP talk to her and she agreed, when IV team arrived patient refused an IV again. Patient is refusing her morning pills again. Called daughter [name] and she convinced her to take the pills. When RN offered pills she refused them again. Will try again.

## 2019-05-06 NOTE — Progress Notes (Signed)
Pt back to Afib, HR in 140's. NP Glenford Peers notified.

## 2019-05-07 ENCOUNTER — Telehealth: Payer: Self-pay | Admitting: Medical

## 2019-05-07 ENCOUNTER — Ambulatory Visit: Payer: Medicare HMO | Admitting: Family

## 2019-05-07 ENCOUNTER — Encounter (HOSPITAL_COMMUNITY): Payer: Self-pay | Admitting: Medical

## 2019-05-07 DIAGNOSIS — E871 Hypo-osmolality and hyponatremia: Secondary | ICD-10-CM

## 2019-05-07 DIAGNOSIS — I5021 Acute systolic (congestive) heart failure: Secondary | ICD-10-CM

## 2019-05-07 DIAGNOSIS — R4182 Altered mental status, unspecified: Secondary | ICD-10-CM

## 2019-05-07 DIAGNOSIS — N179 Acute kidney failure, unspecified: Secondary | ICD-10-CM

## 2019-05-07 DIAGNOSIS — I2583 Coronary atherosclerosis due to lipid rich plaque: Secondary | ICD-10-CM

## 2019-05-07 DIAGNOSIS — I2510 Atherosclerotic heart disease of native coronary artery without angina pectoris: Secondary | ICD-10-CM

## 2019-05-07 HISTORY — DX: Acute systolic (congestive) heart failure: I50.21

## 2019-05-07 HISTORY — DX: Acute kidney failure, unspecified: N17.9

## 2019-05-07 HISTORY — DX: Altered mental status, unspecified: R41.82

## 2019-05-07 LAB — BASIC METABOLIC PANEL
Anion gap: 15 (ref 5–15)
BUN: 10 mg/dL (ref 6–20)
CO2: 21 mmol/L — ABNORMAL LOW (ref 22–32)
Calcium: 10 mg/dL (ref 8.9–10.3)
Chloride: 93 mmol/L — ABNORMAL LOW (ref 98–111)
Creatinine, Ser: 1.01 mg/dL — ABNORMAL HIGH (ref 0.44–1.00)
GFR calc Af Amer: >60 mL/min (ref >60)
GFR calc non Af Amer: >60 mL/min (ref >60)
Glucose, Bld: 155 mg/dL — ABNORMAL HIGH (ref 70–99)
Potassium: 3.5 mmol/L (ref 3.5–5.1)
Sodium: 129 mmol/L — ABNORMAL LOW (ref 135–145)

## 2019-05-07 LAB — VITAMIN B1: Vitamin B1 (Thiamine): 95.6 nmol/L (ref 66.5–200.0)

## 2019-05-07 LAB — GLUCOSE, CAPILLARY: Glucose-Capillary: 140 mg/dL — ABNORMAL HIGH (ref 70–99)

## 2019-05-07 LAB — MAGNESIUM: Magnesium: 1.8 mg/dL (ref 1.7–2.4)

## 2019-05-07 MED ORDER — ATORVASTATIN CALCIUM 40 MG PO TABS: 40.0000 mg | ORAL_TABLET | Freq: Every day | ORAL | 6 refills | Status: DC

## 2019-05-07 MED ORDER — RIVAROXABAN 20 MG PO TABS: 20.0000 mg | ORAL_TABLET | Freq: Every day | ORAL | Status: DC

## 2019-05-07 MED ORDER — AMIODARONE HCL 200 MG PO TABS
400.0000 mg | ORAL_TABLET | Freq: Two times a day (BID) | ORAL | Status: DC
  Administered 2019-05-07: 12:00:00 400 mg via ORAL
  Filled 2019-05-07: qty 2

## 2019-05-07 MED ORDER — LOSARTAN POTASSIUM 25 MG PO TABS: 25.0000 mg | ORAL_TABLET | Freq: Every day | ORAL | 6 refills | Status: DC

## 2019-05-07 MED ORDER — SPIRONOLACTONE 25 MG PO TABS: 12.5000 mg | ORAL_TABLET | Freq: Every day | ORAL | 6 refills | Status: DC

## 2019-05-07 MED ORDER — AMIODARONE HCL 200 MG PO TABS: ORAL_TABLET | ORAL | 1 refills | Status: DC

## 2019-05-07 MED ORDER — RIVAROXABAN 20 MG PO TABS: 20.0000 mg | ORAL_TABLET | Freq: Every day | ORAL | 6 refills | Status: DC

## 2019-05-07 MED ORDER — MAGNESIUM OXIDE 400 (241.3 MG) MG PO TABS: 400.0000 mg | ORAL_TABLET | Freq: Every day | ORAL | 6 refills | Status: DC

## 2019-05-07 NOTE — Telephone Encounter (Signed)
Reviewed patient's charge. Medications are from Hospital discharge. Advised that this should be fine since med changes or medications that get added on typically are reviewed with a pharmacist at the hospital.

## 2019-05-07 NOTE — Progress Notes (Addendum)
Progress Note  Patient Name: Brianna Porter Date of Encounter: 05/07/2019  Primary Cardiologist: Shirlee More, MD   Subjective   No problems overnight. Patient in NSR. Daughter shared her and her mother need to leave today so they have time with the patient's mother, who is in hospice. Plan for discharge today.   Inpatient Medications    Scheduled Meds:  digoxin  0.125 mg Oral Daily   DULoxetine  60 mg Oral BID   feeding supplement (ENSURE ENLIVE)  237 mL Oral TID BM   furosemide  40 mg Oral BID   LORazepam  0.5 mg Oral On Call   losartan  25 mg Oral Daily   magnesium oxide  400 mg Oral Daily   methimazole  5 mg Oral Daily   metoprolol succinate  100 mg Oral Daily   multivitamin with minerals  1 tablet Oral Daily   potassium chloride  20 mEq Oral Daily   Rivaroxaban  15 mg Oral Q supper   spironolactone  12.5 mg Oral Daily   sucralfate  1 g Oral TID WC & HS   Continuous Infusions:  sodium chloride Stopped (05/04/19 1012)   sodium chloride 250 mL (05/04/19 1357)   amiodarone 30 mg/hr (05/07/19 0252)   PRN Meds: sodium chloride, acetaminophen, ALPRAZolam, metoprolol tartrate, ondansetron (ZOFRAN) IV, sodium chloride flush   Vital Signs    Vitals:   05/06/19 1653 05/06/19 2036 05/07/19 0357 05/07/19 0800  BP: 126/80 126/70 (!) 141/84 139/85  Pulse: 100 71 76 75  Resp:  20 20 16   Temp:  97.7 F (36.5 C) 97.7 F (36.5 C) 98 F (36.7 C)  TempSrc:  Oral Oral Oral  SpO2: 100% 100% 97% 100%  Weight:      Height:        Intake/Output Summary (Last 24 hours) at 05/07/2019 0835 Last data filed at 05/07/2019 0100 Gross per 24 hour  Intake 1004.02 ml  Output 1150 ml  Net -145.98 ml   Last 3 Weights 05/07/2019 05/06/2019 05/05/2019  Weight (lbs) (No Data) 125 lb 4.8 oz 118 lb  Weight (kg) (No Data) 56.836 kg 53.524 kg      Telemetry  9/16 Afib rates 130-140  9/16 17:00 NSR rates 70-80s - Personally Reviewed  ECG    No new EKG to review-  Personally Reviewed  Physical Exam   GEN: No acute distress.   Neck: No JVD Cardiac: RRR, no murmurs, rubs, or gallops.  Respiratory: Clear to auscultation bilaterally. GI: Soft, nontender, non-distended  MS: No edema; No deformity. Neuro:  Nonfocal  Psych: Normal affect   Labs    High Sensitivity Troponin:  No results for input(s): TROPONINIHS in the last 720 hours.    Chemistry Recent Labs  Lab 05/03/19 0545 05/04/19 0923 05/05/19 1108  NA 130* 131* 130*  K 4.9 3.8 3.8  CL 94* 95* 95*  CO2 25 22 22   GLUCOSE 134* 138* 124*  BUN 8 10 10   CREATININE 1.00 0.92 0.77  CALCIUM 9.5 9.5 9.9  GFRNONAA >60 >60 >60  GFRAA >60 >60 >60  ANIONGAP 11 14 13      Hematology Recent Labs  Lab 05/03/19 0545 05/04/19 0923 05/05/19 1108  WBC 6.2 9.2 8.6  RBC 4.55 5.16* 5.48*  HGB 11.4* 12.6 13.6  HCT 36.7 42.5 44.5  MCV 80.7 82.4 81.2  MCH 25.1* 24.4* 24.8*  MCHC 31.1 29.6* 30.6  RDW 16.8* 16.9* 16.6*  PLT 431* 544* 428*    BNPNo results for input(s):  BNP, PROBNP in the last 168 hours.   DDimer No results for input(s): DDIMER in the last 168 hours.   Radiology    Ct Coronary Morph W/cta Cor W/score W/ca W/cm &/or Wo/cm  Addendum Date: 05/06/2019   ADDENDUM REPORT: 05/06/2019 17:17 CLINICAL DATA:  Chest pain EXAM: Cardiac/Coronary CTA TECHNIQUE: The patient was scanned on a [employer]. A 100 kV prospective scan was triggered in the descending thoracic aorta at 111 HU's. Axial non-contrast 3 mm slices were carried out through the heart. The data set was analyzed on a dedicated work station and scored using the [place]. Gantry rotation speed was 250 msecs and collimation was .6 mm. No beta blockade and 0.8 mg of sl NTG was given. The 3D data set was reconstructed in 5% intervals of the 35-75 % of the R-R cycle. Diastolic phases were analyzed on a dedicated work station using MPR, MIP and VRT modes. The patient received 80 cc of contrast. FINDINGS: Image quality:  Average. Noise artifact is: There is respiratory motion artifact noted near the sinotubular junction that does not limit evaluation of this study. Coronary Arteries:  Normal coronary origin.  Right dominance. Left main: The left main is a large caliber vessel with a normal take off from the left coronary cusp that bifurcates to form a left anterior descending artery and a left circumflex artery. There is no plaque or stenosis. Left anterior descending artery: The proximal LAD contains minimal calcified plaque (<25%). The mid and distal segments are patent without evidence of plaque or stenosis. The LAD gives off 2 patent diagonal branches. Left circumflex artery: The LCX is non-dominant. The proximal LCX contains minimal calcified plaque (<25%). The LCX gives off 1 large patent obtuse marginal branch. The PDA terminates as a small OM branch. Right coronary artery: The RCA is dominant with normal take off from the right coronary cusp. The RCA is patent without evidence of plaque or stenosis. The RCA terminates as a PDA and right posterolateral branch without evidence of plaque or stenosis. Right Atrium: Right atrial size is within normal limits. Right Ventricle: The right ventricular cavity is within normal limits. Left Atrium: Left atrial size is dilated. There is no left atrial appendage filling defect. Left Ventricle: The ventricular cavity size is within normal limits. There are no stigmata of prior infarction. There is no abnormal filling defect. Pulmonary arteries: Normal in size without proximal filling defect. Pulmonary veins: Normal pulmonary venous drainage. Pericardium: Normal thickness with no significant effusion or calcium present. Cardiac valves: The aortic valve is trileaflet without significant calcification. The mitral valve is normal structure without significant calcification. Aorta: Normal caliber. There is mild calcified plaque noted in the aortic root/ascending aorta/descending aorta.  Extra-cardiac findings: See attached radiology report for non-cardiac structures. IMPRESSION: 1. Coronary calcium score of 51. This was 35 percentile for age and sex matched control. 2. Normal coronary origin with right dominance. 3. Minimal, Non-obstructive CAD (<25%). 4. Atherosclerosis noted in the aortic root/ascending/descending aorta. RECOMMENDATIONS: 1. CAD-RADS 1: Minimal non-obstructive CAD (0-24%). Consider non-atherosclerotic causes of chest pain. Consider preventive therapy and risk factor modification. Eleonore Chiquito, MD Electronically Signed   By: Eleonore Chiquito   On: 05/06/2019 17:17   Result Date: 05/06/2019 EXAM: OVER-READ INTERPRETATION CT CHEST The following report is an over-read performed by radiologist Dr. Evangeline Dakin of [facility], [place] on 05/05/2019. This over-read does not include interpretation of cardiac or coronary anatomy or pathology. The coronary calcium score/coronary CTA interpretation by the cardiologist  is attached. COMPARISON:  None. FINDINGS: Vascular: Mild to moderate atherosclerosis involving the visualized thoracic aorta without evidence of aneurysm. Mediastinum/Nodes: No pathologic lymphadenopathy within the visualized mediastinum. Visualized esophagus normal in appearance. Lungs/Pleura: Visualized lung parenchyma clear. Central bronchi patent without significant bronchial wall thickening. No pleural effusions. Upper Abdomen: Unremarkable for the early arterial phase of enhancement which accounts for the heterogeneous appearance of the spleen. Musculoskeletal: Minimal degenerative changes involving the visualized thoracic spine. IMPRESSION: Aortic Atherosclerosis (ICD10-I70.0). No acute or significant extracardiac findings otherwise. Electronically Signed: By: Evangeline Dakin M.D. On: 05/05/2019 19:23    Cardiac Studies   TEE 04/29/19 IMPRESSIONS  1. The left ventricle has severely reduced systolic function, with an ejection fraction of 20-25%. The cavity  size was normal. Left ventrical global hypokinesis without regional wall motion abnormalities. 2. The right ventricle has severely reduced systolic function. The cavity was normal. There is no increase in right ventricular wall thickness. Right ventricular systolic pressure is normal. 3. No evidence of a thrombus present in the left atrial appendage. 4. Mild mitral insufficiency. The jet is centrally-directed. 5. The aortic root, ascending aorta, aortic arch and descending aorta are normal in size and structure. 6. No intracardiac thrombi or masses were visualized.  DCCV 04/29/19 Cardioverted1time(s).  Cardioversion with synchronized biphasic120Jshock.  Evaluation: Findings: Post procedure EKG shows:NSR, very frequent PACs Complications:None Patientdidtolerate procedure well.  Patient Profile     58 y.o. female former smoker  (quit in 2011)with a h/o hyperthyroidism being treated w/ methimazole, recent diagnosis of atrial fibrillation/ atrial flutter,CHA2DS2 VASc scoreof 4 (CHF, HTN, DM and Female), on Xarelto for a/c, new diagnosis of systolic HF, Stage III CKD, HTN, HLD and DM, being admitted for CHF w/u and management of her atrial arrthymias.  Initially presented for outpatient TEE guided DCCV 9/9 for atrial fibrillation/flutter, but admitted for further management given new finding of severe LV dysfunction and frequentPACs, atrial couplets and brief nonsustained PATon post cardioversion EKG and borderline hypotension, necessitating IV amiodarone loading.  Assessment & Plan    1. Atrial Fibrillation/ Atrial Flutter:  -DCCV 04/29/19 w/ post procedural EKG showed NSR w/ frequent PACs, atrial couplets and brief nonsustained PAT -Repeat EKG on 05/06/2019 with AF with rate at 150bpm -EP had recommended PO Amio load outpt>>>amiodarone 400 mg BID x 5 days, then 200 mg BID.  - reverted back to afib with RVR and started back on IV Amio - Patient now in NSR>> can transition  to PO   2. Cardiomyopathy/ Systolic HF: -TEE [id number] showed severely reduced LVEF at 20% w/ global hypokinesis -Multiple risk factors for ischemic heart disease, but suspect CM likely tachy mediated from rapid afib/flutter. -Continue Toprol XL 100mg  daily and Losartan 25mg  daily, Lasix 40mg  BID PO and spiro 12.5mg  daily -Given new EKG changes from 05/05/2019 and LV dysfunction plan was for Coronary CTA with FFR to rule out obstructive CAD >> no significant CAD noted -Recommend repeat 2D echo in 2-3 months to reassess LVEF  3. Hyperthyroidism: -Will continue home methimazole dose for now -F/u with PCP post discharge for further management -Continue?blocker therapy w/ metoprolol  4. HTN: -BP stable, 133/75 > 139/85 -Monitor closely -Continue Toprol XL 100mg  daily  5. Stage III CKD: -Creatinine 1.32 on admit>>0.77 recent -Improved with diuresis  6. Hypokalemia:  -K 2.6 on admit>>3.8 yesterday. AM labs pending -Improved w/ supplementation  7. Hypomagnesemia: -Mag 1.8 this am -On Mag oxide 400 mg daily >> might need extra supplementation ?  8. Hyponatremia:  -Suspect hypervolemic hyponatremia  related with progressively worsening heart failure -Urine sodium normal  -Na slowly improving with diuresis   For questions or updates, please contact [place] Please consult [email or web address] for contact info under        Signed, Cadence Ninfa Meeker, PA-C  05/07/2019, 8:35 AM     Patient seen and independently examined with Cadence Kathlen Mody, PA. We discussed all aspects of the encounter. I agree with the assessment and plan as stated above.  Patient back in NSR this am on IV Amio.  Patient and daughter insistent that they need to leave because the patient's mother is dying in Hospice and they need to go see her before she passes.  Denies any CP or SOB.    GEN: Well nourished, well developed in no acute distress HEENT: Normal NECK: No JVD; No carotid bruits LYMPHATICS: No  lymphadenopathy CARDIAC:RRR, no murmurs, rubs, gallops RESPIRATORY:  Clear to auscultation without rales, wheezing or rhonchi  ABDOMEN: Soft, non-tender, non-distended MUSCULOSKELETAL:  No edema; No deformity  SKIN: Warm and dry NEUROLOGIC:  Alert and oriented x 3 PSYCHIATRIC:  Normal affect   Coronary CTA yesterday showed coronary Ca score of 51 and minimal non obstructive dz in the LAD and LCx.  She is maintaining NSR on IV Amio.  Will change to PO Amio 400mg  BID .  Stop dig since loading with PO Amio.   EP has recommended IV Amio for one more day but patient and daughter are adamant about leaving today as patient's mother is dying in Hospice and they need to see her. Will d/c home on Toprol XL 100mg  daily, Losartan 25mg  daily, spiro 12.5mg  daily, Lasix 40mg  BID, Kdur 58meq daily, Magox 400mg  daily, Amio 400mg  BID for 5 days and then 200mg  BID until seen back by Cards outpt.  She had been on Xarelto 15mg  daily due to reduced CrCl at 49 but now the calculated CrCl > 80.  She did not tolerate Eliquis.  Increase Xarelto to 20mg  daily.  Will need repeat echo in 2 months to see if LVF improves. She is on Simvastatin and now loading Amio so will change simva to atorvastatin 40mg  daily and check FLP and ALT in 6 weeks.

## 2019-05-07 NOTE — Discharge Summary (Addendum)
Discharge Summary    Patient ID: Brianna Porter MRN: [id number]; DOB: 02/15/1961  Admit date: 04/29/2019 Discharge date: 05/07/2019  Primary Care Provider: Cher Nakai, MD  Primary Cardiologist: Shirlee More, MD  Primary Electrophysiologist:  None - due to establish with Dr. Curt Bears in 05/2019  Discharge Diagnoses    Principal Problem:   Atrial fibrillation with rapid ventricular response ([place]) Active Problems:   CKD (chronic kidney disease) stage 3, GFR 30-59 ml/min (HCC)   Persistent atrial fibrillation   Prolonged Q-T interval on ECG   Essential hypertension   Hypokalemia   Hyponatremia   CAD (coronary artery disease)   Altered mental status   Acute systolic heart failure ([place])   AKI (acute kidney injury) ([place])   Allergies Allergies  Allergen Reactions   Ambien [Zolpidem Tartrate]     Pt and family state this med makes the patient sleepwalk.    Diagnostic Studies/Procedures    Coronary CT 05/05/19 IMPRESSION: 1. Coronary calcium score of 51. This was 2 percentile for age and sex matched control. 2. Normal coronary origin with right dominance. 3. Minimal, Non-obstructive CAD (<25%). 4. Atherosclerosis noted in the aortic root/ascending/descending aorta.  RECOMMENDATIONS: 1. CAD-RADS 1: Minimal non-obstructive CAD (0-24%). Consider non-atherosclerotic causes of chest pain. Consider preventive therapy and risk factor modification.  MRI 05/03/19 IMPRESSION: Motion degraded exam. No acute or reversible finding. Mild chronic small-vessel ischemic change of the cerebral hemispheric white Matter.  CT head 05/01/19 IMPRESSION: Normal exam.  Left shoulder x-ray 05/01/19 IMPRESSION: 1.  No acute osseous abnormality. _____________   History of Present Illness     Brianna Porter is a 58 y.o. female with a h/o hyperthyroidism being treated w/ methimazole, recent diagnosis of atrial fibrillation/ atrial flutter, CHA2DS2 VASc score of 4 (CHF, HTN, DM and  Female), on Xarelto for a/c, new diagnosis of systolic HF, Stage III CKD, HTN, HLD and DM who was admitted for CHF complicated by afib RVR.  [name] is followed by [facility]. PCP is Dr. Cher Nakai in [place]. Limited past medical records available for review, but based on review of PMH and home meds, she was being treated for hyperthyroidism and is currently on methimazole, prescribed 02/2019. She was recently diagnosed w/ atrial fibrillation and atrial flutter and referred to Dr. Bettina Gavia for evaluation. He started her on metoprolol for rate control, however unable to tolerate high doses due to soft BP. Oral amiodarone was later added. Given elevated CHA2DS2VASc score, she was initially started on Eliquis but did not tolerate due to itching and she was switched to Xarelto, reduced dose, 15 mg daily, given reduced renal function. Given persistent atrial fib/flutter and limited ability to tolerated rate control due to borderline hypotension, she was referred for outpatient TEE guided DCCV.  Hospital Course     Consultants: EP and IM  Pt presented to [facility]29[facility]19 for planned procedure. TEE showed severely reduced LVEF at 20% w/ global hypokinesis. No LA thrombus. She was cardioverted back to NSR after 1 synchronized biphasic120Jshock. Post procedural EKG showed NSR with very frequent PACs, atrial couplets and brief nonsustained PAT. Borderline hypotensive. Given her frequent atrial ectopy and newly diagnosed systolic HF, decision was made to admit for further CHF w/u and initiation of AAD therapy. Patient was started on IV amiodarone w/ bolus. BB was held for hypotension. Labs showed hypokalemia 2.6, hyponatremia, Creatinine 1.32, Magnesium 1.2. BNP was 3,353. WBC 9.6, Hgb 10.2. In discussing with Dr. Radford Pax, her hypokalemia was felt due to family self-increasing Lasix recently as  they did not feel she was improving as an outpatient.  As outlined below, the remainder of the admission was complicated by  early return of rapid atrial fib, as well as other issues including confusion/encephalopathy of unclear cause, continued hypokalemia/hypomagnesemia requiring repletion, and persistent hyponatremia.    Cardiac Issues  On 04/29/09 Patient converted back to afib. EP was consulted and recommended continuation of amiodarone despite thyroid disease on methimazole. Thyroid labs were normal. Patient was determined to not be a good candidate for ablation given multiple comorbidities. Despite IV amio patient remained in afib RVR. Toprol was restarted for better rate control. Patient remained in afib RVR with rates in the 140s. Digoxin was added. 6 beat run of NSVT was noted.  Electrolytes were repleted further. Creatinine slowly improved. Patient remained on Lasix 40 mg BID, although held on one occasion for elevated creatinine which normalized later in her stay. Spironolactone was started for LV dysfunction. Patient remained in afib. EP saw the patient again and recommended repeat DCCV and to continue amio load. Patient underwent successful DCCV to NSR.  Given new LV dysfunction as well as ischemic ST changes post DCCV in the anterolateral leads,  CTA with FFR was done.  Although motion artifact noted from breathing, the CTA showed calcium score of 51 and nonobstructive CAD in LAD and LCx. Thus regarding her cardiomyopathy, this is felt to be nonischemic possibly tachy-mediated due to afib. After her scan, she had paroxysms of NSR interspersed with recurrent afib. IV amio was started and patient converted to NSR that evening.   As of 9/17, the patient is maintaing NSR. No problems overnight. Daughter said patient was mentally back to baseline. Patient remained euvolemic. Digoxin was discontinued due to the plan for amiodarone load (and potential drug interaction). After discussion with pharmacy Xarelto was increased to 20 mg daily because CrCl is now 38ml/min. IV was transitioned to PO amio, doing 400mg  twice a day  for 5 days, then 200mg  twice a day thereafter, further dosing plans to be determined in follow-up depending on how she is doing. Her simvastatin was changed to atorvastatin due to interaction with amiodarone. If patient is tolerating switch at time of follow-up, will need repeat LFTs/lipids arranged for 6 weeks. Dr. Radford Pax also suggests consideration of echo in 2 months which can be arranged as outpatient as well.  Otherwise, labs showed mag 1.8 (prompting initiation of MagOx). Repeat BMET was ordered for today but the lab had issues sticking the patient. It is pending at time of discharge but the patient refused to stay long enough to have it resulted and therefore requested we call her with results. For now will plan on continuing home dose of potassium (65meq BID), with adjustment if needed based on today's BMET which we will follow up on.  Patient was advised to hold her metformin post cardiac CT with instructions to resume 9/19.   Patient was examined by Dr. Radford Pax 05/07/19 and felt patient was stable for discharge. F/u is planned for 1 week visit with Dr. Joya Gaskins APP Laurann Montana. Would suggest considering repeat BMET/Mg at that visit. EP f/u also arranged for October 12 with Dr. Curt Bears.   Non Cardiac Issues  Due to hyponatremia, confusion, 25 lb weight loss IM was consulted as well for further endocrine work-up. CT head and MRI brain were unrevealing for acute cause. She had had recently nausea/vomiting prior to admission so IM started carafate (chosen for long Qt). AM cortisol was normal. IM ordered a CXR of the  shoulder since patient admitted to recent fall. X-ray showed no acute fracture. Daughter noted that patient had been confused for the last 1-2 weeks prior to admission. UA was negative for infection. Fortunately by day of discharge she was completely clear. Carafate was discontinued at discharge, having completed approximately 7 day course. She will need follow up with primary care for her  medical issues as well. As a result of her confusion, her gabapentin, phenergan, and tramadol were stopped at discharge. _____________  Discharge Vitals Blood pressure 137/76, pulse 72, temperature 98.7 F (37.1 C), temperature source Oral, resp. rate 16, height 5\' 3"  (1.6 m), weight 56.8 kg, SpO2 100 %.  Filed Weights   05/04/19 0537 05/05/19 0501 05/06/19 0537  Weight: 58.5 kg 53.5 kg 56.8 kg    Labs & Radiologic Studies    CBC Recent Labs    05/05/19 1108  WBC 8.6  HGB 13.6  HCT 44.5  MCV 81.2  PLT 123456*   Basic Metabolic Panel Recent Labs    05/05/19 1108 05/07/19 0748  NA 130*  --   K 3.8  --   CL 95*  --   CO2 22  --   GLUCOSE 124*  --   BUN 10  --   CREATININE 0.77  --   CALCIUM 9.9  --   MG 1.8 1.8   _____________  Ct Head Wo Contrast  Result Date: 05/01/2019 CLINICAL DATA:  Altered level of consciousness. Patient's daughter reports that she has had altered mental status for a few weeks. EXAM: CT HEAD WITHOUT CONTRAST TECHNIQUE: Contiguous axial images were obtained from the base of the skull through the vertex without intravenous contrast. COMPARISON:  CT scan dated 09/27/2012 FINDINGS: Brain: No evidence of acute infarction, hemorrhage, hydrocephalus, extra-axial collection or mass lesion/mass effect. Vascular: No hyperdense vessel or unexpected calcification. Skull: Normal. Negative for fracture or focal lesion. Sinuses/Orbits: Normal. Other: None IMPRESSION: Normal exam. Electronically Signed   By: Lorriane Shire M.D.   On: 05/01/2019 14:40   Mr Brain Wo Contrast  Result Date: 05/03/2019 CLINICAL DATA:  In cephalopathy. Diabetes and hypertension. Confusion and falling. EXAM: MRI HEAD WITHOUT CONTRAST TECHNIQUE: Multiplanar, multiecho pulse sequences of the brain and surrounding structures were obtained without intravenous contrast. COMPARISON:  Head CT 05/01/2019 FINDINGS: Brain: The study suffers from motion degradation. Allowing for that, there is no evidence  of acute infarction, mass lesion, hemorrhage, hydrocephalus or extra-axial collection. There are minor small vessel changes of the cerebral hemispheric white matter. Vascular: Major vessels at the base of the brain show flow. Skull and upper cervical spine: Negative Sinuses/Orbits: Clear/normal Other: None IMPRESSION: Motion degraded exam. No acute or reversible finding. Mild chronic small-vessel ischemic change of the cerebral hemispheric white matter. Electronically Signed   By: Nelson Chimes M.D.   On: 05/03/2019 11:22   Ct Coronary Morph W/cta Cor W/score W/ca W/cm &/or Wo/cm  Addendum Date: 05/06/2019   ADDENDUM REPORT: 05/06/2019 17:17 CLINICAL DATA:  Chest pain EXAM: Cardiac/Coronary CTA TECHNIQUE: The patient was scanned on a [employer]. A 100 kV prospective scan was triggered in the descending thoracic aorta at 111 HU's. Axial non-contrast 3 mm slices were carried out through the heart. The data set was analyzed on a dedicated work station and scored using the [place]. Gantry rotation speed was 250 msecs and collimation was .6 mm. No beta blockade and 0.8 mg of sl NTG was given. The 3D data set was reconstructed in 5% intervals of the 35-75 %  of the R-R cycle. Diastolic phases were analyzed on a dedicated work station using MPR, MIP and VRT modes. The patient received 80 cc of contrast. FINDINGS: Image quality: Average. Noise artifact is: There is respiratory motion artifact noted near the sinotubular junction that does not limit evaluation of this study. Coronary Arteries:  Normal coronary origin.  Right dominance. Left main: The left main is a large caliber vessel with a normal take off from the left coronary cusp that bifurcates to form a left anterior descending artery and a left circumflex artery. There is no plaque or stenosis. Left anterior descending artery: The proximal LAD contains minimal calcified plaque (<25%). The mid and distal segments are patent without evidence of  plaque or stenosis. The LAD gives off 2 patent diagonal branches. Left circumflex artery: The LCX is non-dominant. The proximal LCX contains minimal calcified plaque (<25%). The LCX gives off 1 large patent obtuse marginal branch. The PDA terminates as a small OM branch. Right coronary artery: The RCA is dominant with normal take off from the right coronary cusp. The RCA is patent without evidence of plaque or stenosis. The RCA terminates as a PDA and right posterolateral branch without evidence of plaque or stenosis. Right Atrium: Right atrial size is within normal limits. Right Ventricle: The right ventricular cavity is within normal limits. Left Atrium: Left atrial size is dilated. There is no left atrial appendage filling defect. Left Ventricle: The ventricular cavity size is within normal limits. There are no stigmata of prior infarction. There is no abnormal filling defect. Pulmonary arteries: Normal in size without proximal filling defect. Pulmonary veins: Normal pulmonary venous drainage. Pericardium: Normal thickness with no significant effusion or calcium present. Cardiac valves: The aortic valve is trileaflet without significant calcification. The mitral valve is normal structure without significant calcification. Aorta: Normal caliber. There is mild calcified plaque noted in the aortic root/ascending aorta/descending aorta. Extra-cardiac findings: See attached radiology report for non-cardiac structures. IMPRESSION: 1. Coronary calcium score of 51. This was 63 percentile for age and sex matched control. 2. Normal coronary origin with right dominance. 3. Minimal, Non-obstructive CAD (<25%). 4. Atherosclerosis noted in the aortic root/ascending/descending aorta. RECOMMENDATIONS: 1. CAD-RADS 1: Minimal non-obstructive CAD (0-24%). Consider non-atherosclerotic causes of chest pain. Consider preventive therapy and risk factor modification. Eleonore Chiquito, MD Electronically Signed   By: Eleonore Chiquito   On:  05/06/2019 17:17   Result Date: 05/06/2019 EXAM: OVER-READ INTERPRETATION CT CHEST The following report is an over-read performed by radiologist Dr. Evangeline Dakin of [facility], [place] on 05/05/2019. This over-read does not include interpretation of cardiac or coronary anatomy or pathology. The coronary calcium score/coronary CTA interpretation by the cardiologist is attached. COMPARISON:  None. FINDINGS: Vascular: Mild to moderate atherosclerosis involving the visualized thoracic aorta without evidence of aneurysm. Mediastinum/Nodes: No pathologic lymphadenopathy within the visualized mediastinum. Visualized esophagus normal in appearance. Lungs/Pleura: Visualized lung parenchyma clear. Central bronchi patent without significant bronchial wall thickening. No pleural effusions. Upper Abdomen: Unremarkable for the early arterial phase of enhancement which accounts for the heterogeneous appearance of the spleen. Musculoskeletal: Minimal degenerative changes involving the visualized thoracic spine. IMPRESSION: Aortic Atherosclerosis (ICD10-I70.0). No acute or significant extracardiac findings otherwise. Electronically Signed: By: Evangeline Dakin M.D. On: 05/05/2019 19:23   Dg Shoulder Left Port  Result Date: 05/01/2019 CLINICAL DATA:  Left shoulder pain.  Recent fall. EXAM: LEFT SHOULDER - 1 VIEW COMPARISON:  Left shoulder x-rays dated October 20, 2012. FINDINGS: No acute fracture or dislocation. Unchanged mild acromioclavicular  osteoarthritis. Bone mineralization is normal. Soft tissues are unremarkable. IMPRESSION: 1.  No acute osseous abnormality. Electronically Signed   By: Titus Dubin M.D.   On: 05/01/2019 12:43   Disposition   Pt is being discharged home today in good condition.  Follow-up Plans & Appointments    Follow-up Information    Constance Haw, MD Follow up on 06/01/2019.   Specialty: Cardiology Why: at 3 pm for post hospital follow up Contact information: [place] [address] [phone]        Cher Nakai, MD. Schedule an appointment as soon as possible for a visit in 1 week(s).   Specialty: Internal Medicine Why: To be seen with repeat labs (CBC, BMP, Mg & EKG to follow QTC).  May consider GI consultation if she has recurrent nausea or vomiting and Neurology consultation if has recurrent mental status changes.  Also consider outpatient sleep study to rule out OSA Contact information: [address] [place] [address] [phone]        Loel Dubonnet, NP Follow up.   Specialty: Cardiology Why: We rescheduled your appointment from today to 05/14/19 at 8:45am with Laurann Montana, one of the nurse practitioners that works with Dr. Bettina Gavia. Contact information: [place] [address] [phone]          Discharge Instructions    Diet - low sodium heart healthy   Complete by: As directed    Discharge instructions   Complete by: As directed    Please review medication list carefully because there were several medications that were stopped, changed or started.   Your metformin needs to be temporarily stopped because of the contrast dye from your CT scan. You can restart this the morning of 05/09/19.   Please note your Xarelto dose has INCREASED. This is a new tablet/dosage size. It was adjusted because your kidney function improved. It is important to take the correct dose (20mg ) since you recently had a cardioversion.  Your simvastatin can interact with amiodarone, so it was changed to atorvastatin.  There are a few medicines that were stopped that we'd like you to talk to your primary care about. Tramadol and gabapentin were stopped because of your confusion. Phenergan was stopped because of a long interval on your EKG (this can cause rhythm issues). Your B12 level was actually high, so your supplement was discontinued.   Increase activity slowly   Complete by: As directed        Discharge Medications   Allergies as of 05/07/2019      Reactions   Ambien [zolpidem Tartrate]    Pt and family state this med makes the patient sleepwalk.      Medication List    STOP taking these medications   gabapentin 800 MG tablet Commonly known as: NEURONTIN   promethazine 25 MG tablet Commonly known as: PHENERGAN   simvastatin 80 MG tablet Commonly known as: ZOCOR   traMADol 50 MG tablet Commonly known as: ULTRAM   vitamin B-12 1000 MCG tablet Commonly known as: CYANOCOBALAMIN     TAKE these medications   ALPRAZolam 0.5 MG tablet Commonly known as: XANAX Take 0.5 mg by mouth 3 (three) times daily as needed for anxiety.   amiodarone 200 MG tablet Commonly known as: PACERONE Take 2 tablets (400mg ) by mouth twice daily through 9/21. On 9/22, decrease to 1 tablet twice a day.   atorvastatin 40 MG tablet Commonly known  as: Lipitor Take 1 tablet (40 mg total) by mouth daily.   cetirizine 10 MG tablet Commonly known as: ZYRTEC Take 10 mg by mouth as needed for allergies.   cholecalciferol 25 MCG (1000 UT) tablet Commonly known as: VITAMIN D3 Take 1,000 Units by mouth daily.   DULoxetine 60 MG capsule Commonly known as: CYMBALTA Take 60 mg by mouth 2 (two) times daily.   fenofibrate 48 MG tablet Commonly known as: TRICOR Take 48 mg by mouth at bedtime.   furosemide 40 MG tablet Commonly known as: LASIX Take 1 tablet (40 mg total) by mouth 2 (two) times daily.   ICY HOT ADVANCED RELIEF EX Apply 1 application topically as needed (back pain).   losartan 25 MG tablet Commonly known as: COZAAR Take 1 tablet (25 mg total) by mouth daily. Start taking on: May 08, 2019   magnesium oxide 400 (241.3 Mg) MG tablet Commonly known as: MAG-OX Take 1 tablet (400 mg total) by mouth daily. Start taking on: May 08, 2019   metFORMIN 850 MG tablet Commonly known as: GLUCOPHAGE Take 850 mg by mouth 2 (two) times daily. Notes to patient:  IMPORTANT!!! Do not take any 9/17 or 9/18. You can restart this the morning of 05/09/19.    methimazole 5 MG tablet Commonly known as: TAPAZOLE Take 5 mg by mouth daily.   metoprolol succinate 100 MG 24 hr tablet Commonly known as: TOPROL-XL Take 100 mg by mouth daily.   potassium chloride SA 20 MEQ tablet Commonly known as: K-DUR Take 1 tablet (20 mEq total) by mouth 2 (two) times daily.   rivaroxaban 20 MG Tabs tablet Commonly known as: Xarelto Take 1 tablet (20 mg total) by mouth daily with supper. What changed:   medication strength  how much to take   spironolactone 25 MG tablet Commonly known as: ALDACTONE Take 0.5 tablets (12.5 mg total) by mouth daily. Start taking on: May 08, 2019   vitamin C 1000 MG tablet Take 1,000 mg by mouth daily.        Acute coronary syndrome (MI, NSTEMI, STEMI, etc) this admission?: No.    Outstanding Labs/Studies   None  Duration of Discharge Encounter   Greater than 30 minutes including physician time.  Signed, Cadence Ninfa Meeker, PA-C 05/07/2019, 12:22 PM   Agree with discharge summary as outlined by Cadence Furth with minor corrections.  Fransico Him, MD

## 2019-05-07 NOTE — Telephone Encounter (Signed)
Patient being discharged today. Has f/u 9/24 with Laurann Montana but will need TOC f/u phone call.  I do not see a [place] TOC pool so I will route to the [address] [facility] pool.

## 2019-05-07 NOTE — Telephone Encounter (Signed)
° °  Pharmacy notes medication issue:  1. Name of Medication:  amiodarone (PACERONE) 200 MG tablet atorvastatin (LIPITOR) 40 MG tablet   2. How are you currently taking this medication (dosage and times per day)? Pt has not started taking the medications yet  3. Are you having a reaction (difficulty breathing--STAT)? no  4. What is your medication issue? Pharmacy called and wanted to know if the patient should still be taking both of these medications. There have been side effects from taking both medications at the same time.  The pharmacy needs approval from the doctor to allow both meds to be dispensed

## 2019-05-08 NOTE — Telephone Encounter (Signed)
Patient contacted regarding discharge from [facility] on 05/07/2019.  Patient understands to follow up with provider Owens Shark, NP  on 05/14/2019 at 8:45 am  at [facility]. Patient understands discharge instructions? Yes  Patient understands medications and regiment? Yes  Patient understands to bring all medications to this visit? Yes       Completed TOC call for patient.

## 2019-05-08 NOTE — Telephone Encounter (Signed)
Will forward this message to our [facility] triage team to contact the pt for this TCM call.

## 2019-05-12 DIAGNOSIS — I482 Chronic atrial fibrillation, unspecified: Secondary | ICD-10-CM | POA: Diagnosis not present

## 2019-05-12 DIAGNOSIS — I4892 Unspecified atrial flutter: Secondary | ICD-10-CM | POA: Diagnosis not present

## 2019-05-12 DIAGNOSIS — J309 Allergic rhinitis, unspecified: Secondary | ICD-10-CM | POA: Diagnosis not present

## 2019-05-12 DIAGNOSIS — R7989 Other specified abnormal findings of blood chemistry: Secondary | ICD-10-CM | POA: Diagnosis not present

## 2019-05-12 DIAGNOSIS — R809 Proteinuria, unspecified: Secondary | ICD-10-CM | POA: Diagnosis not present

## 2019-05-12 DIAGNOSIS — D509 Iron deficiency anemia, unspecified: Secondary | ICD-10-CM | POA: Diagnosis not present

## 2019-05-12 DIAGNOSIS — R609 Edema, unspecified: Secondary | ICD-10-CM | POA: Diagnosis not present

## 2019-05-12 DIAGNOSIS — E7800 Pure hypercholesterolemia, unspecified: Secondary | ICD-10-CM | POA: Diagnosis not present

## 2019-05-12 DIAGNOSIS — I10 Essential (primary) hypertension: Secondary | ICD-10-CM | POA: Diagnosis not present

## 2019-05-12 DIAGNOSIS — I5022 Chronic systolic (congestive) heart failure: Secondary | ICD-10-CM | POA: Diagnosis not present

## 2019-05-12 DIAGNOSIS — M25552 Pain in left hip: Secondary | ICD-10-CM | POA: Diagnosis not present

## 2019-05-14 ENCOUNTER — Encounter: Payer: Self-pay | Admitting: Family

## 2019-05-14 ENCOUNTER — Ambulatory Visit: Payer: Medicare HMO | Admitting: Family

## 2019-05-14 DIAGNOSIS — E0590 Thyrotoxicosis, unspecified without thyrotoxic crisis or storm: Secondary | ICD-10-CM | POA: Insufficient documentation

## 2019-05-14 DIAGNOSIS — S40022A Contusion of left upper arm, initial encounter: Secondary | ICD-10-CM | POA: Diagnosis not present

## 2019-05-14 DIAGNOSIS — D509 Iron deficiency anemia, unspecified: Secondary | ICD-10-CM | POA: Insufficient documentation

## 2019-05-14 DIAGNOSIS — M79602 Pain in left arm: Secondary | ICD-10-CM | POA: Diagnosis not present

## 2019-05-14 DIAGNOSIS — I2510 Atherosclerotic heart disease of native coronary artery without angina pectoris: Secondary | ICD-10-CM | POA: Insufficient documentation

## 2019-05-14 DIAGNOSIS — E119 Type 2 diabetes mellitus without complications: Secondary | ICD-10-CM | POA: Insufficient documentation

## 2019-05-14 DIAGNOSIS — I428 Other cardiomyopathies: Secondary | ICD-10-CM | POA: Insufficient documentation

## 2019-05-14 DIAGNOSIS — R410 Disorientation, unspecified: Secondary | ICD-10-CM | POA: Insufficient documentation

## 2019-05-14 NOTE — Progress Notes (Deleted)
Office Visit    Patient Name: Brianna Porter Date of Encounter: 05/14/2019  Primary Care Provider:  Cher Nakai, MD Primary Cardiologist:  Shirlee More, MD Electrophysiologist:  None - Plan to establish with Dr. Curt Bears 05/2019  Chief Complaint    Brianna Porter is a 58 y.o. female with a hx hyperthyroidism treated with methimazole, atrial fibrillation/atrial flutter with CHA2DS2 VASc score 4 (CHF, HTN, DM, female) on Xarelto, hypertensive heart disease with systolic heart failure and CKD 3, HLD, DM2, iron deficiency anemia presents today for transitions of care follow-up after hospitalization.  Past Medical History    Past Medical History:  Diagnosis Date   Anxiety and depression    Atrial flutter (HCC)    Chronic systolic CHF (congestive heart failure) ([place])    a. dx in setting of atrial fib/flutter - possibly tachy mediated. Coronary CTA with only mild CAD in 04/2019.   CKD (chronic kidney disease), stage II    Confusion    a. persistent confusion during 04/2019 admission of unclear cause. Home meds adjusted. CT/MRI brain nonacute.   Diabetes ([place])    Edema    Elevated liver function tests    Hypercholesteremia    Hyperkalemia    Hypertension    Hyperthyroidism    Hypokalemia    Hypomagnesemia    Hyponatremia    Iron deficiency anemia    Mild CAD    a. Coronary CT 04/2019 - Minimal, Non-obstructive CAD.   NICM (nonischemic cardiomyopathy) ([place])    Persistent atrial fibrillation    Prolonged QT interval    Past Surgical History:  Procedure Laterality Date   CARDIOVERSION N/A 04/29/2019   Procedure: CARDIOVERSION;  Surgeon: Sanda Klein, MD;  Location: [place];  Service: Cardiovascular;  Laterality: N/A;   CARDIOVERSION N/A 05/04/2019   Procedure: CARDIOVERSION;  Surgeon: Josue Hector, MD;  Location: [facility] ENDOSCOPY;  Service: Cardiovascular;  Laterality: N/A;   TEE WITHOUT CARDIOVERSION  04/29/2019   TEE WITHOUT CARDIOVERSION N/A  04/29/2019   Procedure: TRANSESOPHAGEAL ECHOCARDIOGRAM (TEE);  Surgeon: Sanda Klein, MD;  Location: [facility] ENDOSCOPY;  Service: Cardiovascular;  Laterality: N/A;   TUBAL LIGATION      Allergies  Allergies  Allergen Reactions   Ambien [Zolpidem Tartrate]     Pt and family state this med makes the patient sleepwalk.    History of Present Illness    Brianna Porter is a 58 y.o. female with a hx of hyperthyroidism treated with methimazole, atrial fibrillation/atrial flutter with CHA2DS2 VASc score 4 (CHF, HTN, DM, female) on Xarelto and amiodarone, nonobstructive CAD, hypertensive heart disease with systolic heart failure and CKD 3, HLD, DM2, iron deficiency anemia. Presents today for transitions of care follow-up. She was last seen while hospitalized.   She was seen by Dr. Bettina Gavia 04/23/2019.  Noted to have increasing edema and worsening heart failure.  She was switched to Xarelto from Eliquis due to pruritus.   Presented to [facility] 04/29/19 for TEE which showed LVEF 20% with global hypokinesis and no LA thrombus, successful cardioversion. Post procedure EKG with NSR with frequent PAC, atrial couplet, brief nonsustained PAT. Due to new dx of systolic HF she was admitted. Her admission was complicated by early return of rapid atrial fibrillation, confusion/encephalopathy of unclear origin, hypokalemia/hypomagnesia, persistent hyponatremia.   04/30/19 converted back to atrial fib. EP consulted, recommended continuing amiodarone and deemed poor candidate for ablation. Depite IV amio, Toprol, Digoxin, repletion of electrolytes atrial fib persisted. She underwent successful DCCV. CTA with FFR done due to ischemic  ST changes post DCCV in anterolateral leads 05/05/19 showed Ca score 51, nonobstuctive CAD in LAD and LCx. Simvastatin was changed to Atorvastatin due to interaction with amiodarone. Her cardiomyopathy was felt to be possibly tachy-mediated due to afib. During hospitalization IM followed for endocrine  workup due to weight loss, confusion, hyponatremia. CT head and MRI brain unrevealing for acute cause.  Recommendations at discharge for Amiodarone 400mg  BID x5 days then 200mg  BID, LFT/lipid in 6 weeks, echo in 2 months. Gabapentin, phenergan, tramadol stopped at discharge due to confusion during hospitalization. Her Xarelto was increased due to improved creatinine clearance by pharmacy.   EKGs/Labs/Other Studies Reviewed:   The following studies were reviewed today:  TEE 04/29/19 PROCEDURE: The transesophogeal probe was passed through the esophogus of the patient. The patient developed no complications during the procedure.   IMPRESSIONS  1. The left ventricle has severely reduced systolic function, with an ejection fraction of 20-25%. The cavity size was normal. Left ventrical global hypokinesis without regional wall motion abnormalities.  2. The right ventricle has severely reduced systolic function. The cavity was normal. There is no increase in right ventricular wall thickness. Right ventricular systolic pressure is normal.  3. No evidence of a thrombus present in the left atrial appendage.  4. Mild mitral insufficiency. The jet is centrally-directed.  5. The aortic root, ascending aorta, aortic arch and descending aorta are normal in size and structure.  6. No intracardiac thrombi or masses were visualized.  CT head 05/01/19 IMPRESSION: Normal exam L shoulder x-ray 05/01/19  1. No acute osseous abnormality MRI 05/03/19 Motion degraded exam. No acute or reversible finding. Mild chronic small-vessel ischemic change of the cerebral hemispheric white matter.   Coronary CT 05/05/19 IMPRESSION: 1. Coronary calcium score of 51. This was 9 percentile for age and sex matched control.   2. Normal coronary origin with right dominance.   3. Minimal, Non-obstructive CAD (<25%).   4. Atherosclerosis noted in the aortic root/ascending/descending aorta.   RECOMMENDATIONS: 1. CAD-RADS 1:  Minimal non-obstructive CAD (0-24%). Consider non-atherosclerotic causes of chest pain. Consider preventive therapy and risk factor modification.   EKG:  EKG is ordered today.  The ekg ordered today demonstrates ***  Recent Labs: 04/23/2019: NT-Pro BNP 3,353 04/29/2019: ALT 63 04/30/2019: TSH 3.246 05/05/2019: Hemoglobin 13.6; Platelets 428 05/07/2019: BUN 10; Creatinine, Ser 1.01; Magnesium 1.8; Potassium 3.5; Sodium 129  Recent Lipid Panel No results found for: CHOL, TRIG, HDL, CHOLHDL, VLDL, LDLCALC, LDLDIRECT  Home Medications   No outpatient medications have been marked as taking for the 05/14/19 encounter (Appointment) with Loel Dubonnet, NP.      Review of Systems      ROS All other systems reviewed and are otherwise negative except as noted above.  Physical Exam    VS:  LMP  (LMP Unknown) Comment: ?10 years ago? , BMI There is no height or weight on file to calculate BMI. GEN: Well nourished, well developed, in no acute distress. HEENT: normal. Neck: Supple, no JVD, carotid bruits, or masses. Cardiac: ***RRR, no murmurs, rubs, or gallops. No clubbing, cyanosis, edema.  ***Radials/DP/PT 2+ and equal bilaterally.  Respiratory:  ***Respirations regular and unlabored, clear to auscultation bilaterally. GI: Soft, nontender, nondistended, BS + x 4. MS: No deformity or atrophy. Skin: Warm and dry, no rash. Neuro:  Strength and sensation are intact. Psych: Normal affect.  Accessory Clinical Findings    ECG personally reviewed by me today - *** - no acute changes.  Assessment & Plan  1. ***  Disposition: Follow up {follow up:15908} with ***   Loel Dubonnet, NP 05/14/2019, 7:56 AM

## 2019-05-15 ENCOUNTER — Ambulatory Visit: Payer: Medicare HMO | Admitting: Family

## 2019-05-19 ENCOUNTER — Other Ambulatory Visit: Payer: Self-pay

## 2019-05-19 ENCOUNTER — Ambulatory Visit (INDEPENDENT_AMBULATORY_CARE_PROVIDER_SITE_OTHER): Payer: Medicare HMO | Admitting: Cardiology

## 2019-05-19 DIAGNOSIS — J309 Allergic rhinitis, unspecified: Secondary | ICD-10-CM | POA: Diagnosis not present

## 2019-05-19 DIAGNOSIS — I5022 Chronic systolic (congestive) heart failure: Secondary | ICD-10-CM | POA: Diagnosis not present

## 2019-05-19 DIAGNOSIS — I4892 Unspecified atrial flutter: Secondary | ICD-10-CM | POA: Diagnosis not present

## 2019-05-19 DIAGNOSIS — I4891 Unspecified atrial fibrillation: Secondary | ICD-10-CM | POA: Diagnosis not present

## 2019-05-19 DIAGNOSIS — R7989 Other specified abnormal findings of blood chemistry: Secondary | ICD-10-CM | POA: Diagnosis not present

## 2019-05-19 DIAGNOSIS — M25512 Pain in left shoulder: Secondary | ICD-10-CM | POA: Diagnosis not present

## 2019-05-19 DIAGNOSIS — R2681 Unsteadiness on feet: Secondary | ICD-10-CM | POA: Diagnosis not present

## 2019-05-19 DIAGNOSIS — I482 Chronic atrial fibrillation, unspecified: Secondary | ICD-10-CM | POA: Diagnosis not present

## 2019-05-19 DIAGNOSIS — R609 Edema, unspecified: Secondary | ICD-10-CM | POA: Diagnosis not present

## 2019-05-19 DIAGNOSIS — R809 Proteinuria, unspecified: Secondary | ICD-10-CM | POA: Diagnosis not present

## 2019-05-19 DIAGNOSIS — I10 Essential (primary) hypertension: Secondary | ICD-10-CM | POA: Diagnosis not present

## 2019-05-19 NOTE — Progress Notes (Addendum)
The did not present for an office visit.  Patient came for an EKG today.  She is alert awake and oriented.  Her EKG sinus rhythm, heart rate 66 bpm with first-degree AV block and occasional PVC.  Upon comparison  to her EKG done on 05/07/2019 PVC was also present however QT prolongation and PAC is no longer present..   We will send a copy of this to Dr. Bettina Gavia for further management.  Patient also wanted to know when her next appointment will be with Dr Bettina Gavia and we will message his nurse for those details to contact the patient.

## 2019-05-26 DIAGNOSIS — I10 Essential (primary) hypertension: Secondary | ICD-10-CM | POA: Diagnosis not present

## 2019-05-26 DIAGNOSIS — M25512 Pain in left shoulder: Secondary | ICD-10-CM | POA: Diagnosis not present

## 2019-05-26 DIAGNOSIS — I482 Chronic atrial fibrillation, unspecified: Secondary | ICD-10-CM | POA: Diagnosis not present

## 2019-05-26 DIAGNOSIS — R809 Proteinuria, unspecified: Secondary | ICD-10-CM | POA: Diagnosis not present

## 2019-05-26 DIAGNOSIS — I5022 Chronic systolic (congestive) heart failure: Secondary | ICD-10-CM | POA: Diagnosis not present

## 2019-05-26 DIAGNOSIS — E119 Type 2 diabetes mellitus without complications: Secondary | ICD-10-CM | POA: Diagnosis not present

## 2019-05-26 DIAGNOSIS — R2681 Unsteadiness on feet: Secondary | ICD-10-CM | POA: Diagnosis not present

## 2019-05-26 DIAGNOSIS — J309 Allergic rhinitis, unspecified: Secondary | ICD-10-CM | POA: Diagnosis not present

## 2019-05-26 DIAGNOSIS — I4892 Unspecified atrial flutter: Secondary | ICD-10-CM | POA: Diagnosis not present

## 2019-05-26 DIAGNOSIS — R7989 Other specified abnormal findings of blood chemistry: Secondary | ICD-10-CM | POA: Diagnosis not present

## 2019-05-26 DIAGNOSIS — R609 Edema, unspecified: Secondary | ICD-10-CM | POA: Diagnosis not present

## 2019-05-28 ENCOUNTER — Telehealth: Payer: Self-pay | Admitting: *Deleted

## 2019-05-28 NOTE — Telephone Encounter (Signed)
-----   Message from Richardo Priest, MD sent at 05/22/2019  1:39 PM EDT ----- Would like for her to remain on amiodarone. ----- Message ----- From: Berniece Salines, DO Sent: 05/22/2019   1:24 PM EDT To: Richardo Priest, MD

## 2019-05-28 NOTE — Telephone Encounter (Signed)
Patient advised to continue taking her amiodarone 200 mg twice daily as prescribed. Patient is agreeable. She has been scheduled for a follow up appointment with Dr. Bettina Gavia on Friday, 06/12/2019, at 3:55 pm in the [facility] office. Patient will arrive 15 minutes early. She verbalized understanding. No further questions.

## 2019-06-01 ENCOUNTER — Other Ambulatory Visit: Payer: Self-pay

## 2019-06-01 ENCOUNTER — Encounter: Payer: Self-pay | Admitting: Cardiology

## 2019-06-01 ENCOUNTER — Ambulatory Visit (INDEPENDENT_AMBULATORY_CARE_PROVIDER_SITE_OTHER): Payer: Medicare HMO | Admitting: Cardiology

## 2019-06-01 VITALS — BP 169/86 | HR 106 | Ht 63.0 in | Wt 130.6 lb

## 2019-06-01 DIAGNOSIS — E0590 Thyrotoxicosis, unspecified without thyrotoxic crisis or storm: Secondary | ICD-10-CM | POA: Diagnosis not present

## 2019-06-01 DIAGNOSIS — I10 Essential (primary) hypertension: Secondary | ICD-10-CM | POA: Diagnosis not present

## 2019-06-01 DIAGNOSIS — R609 Edema, unspecified: Secondary | ICD-10-CM | POA: Diagnosis not present

## 2019-06-01 DIAGNOSIS — I5020 Unspecified systolic (congestive) heart failure: Secondary | ICD-10-CM | POA: Diagnosis not present

## 2019-06-01 DIAGNOSIS — I4892 Unspecified atrial flutter: Secondary | ICD-10-CM | POA: Diagnosis not present

## 2019-06-01 DIAGNOSIS — I482 Chronic atrial fibrillation, unspecified: Secondary | ICD-10-CM | POA: Diagnosis not present

## 2019-06-01 DIAGNOSIS — I4819 Other persistent atrial fibrillation: Secondary | ICD-10-CM

## 2019-06-01 DIAGNOSIS — R809 Proteinuria, unspecified: Secondary | ICD-10-CM | POA: Diagnosis not present

## 2019-06-01 DIAGNOSIS — E7800 Pure hypercholesterolemia, unspecified: Secondary | ICD-10-CM | POA: Diagnosis not present

## 2019-06-01 DIAGNOSIS — B370 Candidal stomatitis: Secondary | ICD-10-CM | POA: Diagnosis not present

## 2019-06-01 DIAGNOSIS — J309 Allergic rhinitis, unspecified: Secondary | ICD-10-CM | POA: Diagnosis not present

## 2019-06-01 DIAGNOSIS — R7989 Other specified abnormal findings of blood chemistry: Secondary | ICD-10-CM | POA: Diagnosis not present

## 2019-06-01 MED ORDER — CARVEDILOL 6.25 MG PO TABS: 6.2500 mg | ORAL_TABLET | Freq: Two times a day (BID) | ORAL | 3 refills | Status: DC

## 2019-06-01 MED ORDER — LOSARTAN POTASSIUM 50 MG PO TABS: 50.0000 mg | ORAL_TABLET | Freq: Every day | ORAL | 6 refills | Status: DC

## 2019-06-01 NOTE — Patient Instructions (Addendum)
Medication Instructions:  Your physician has recommended you make the following change in your medication: 1. INCREASE Losartan to 50 mg once daily 2. START Carvedilol 6.25 mg twice a day  * If you need a refill on your cardiac medications before your next appointment, please call your pharmacy.   Labwork: None ordered  Testing/Procedures: Your physician has requested that you have an echocardiogram in 3 months, before you follow up with Dr. Curt Bears. Echocardiography is a painless test that uses sound waves to create images of your heart. It provides your doctor with information about the size and shape of your heart and how well your heart's chambers and valves are working. This procedure takes approximately one hour. There are no restrictions for this procedure.  Follow-Up: Your physician recommends that you schedule a follow-up appointment in: 3 months with Dr. Curt Bears, after your echocardiogram has been completed.  A letter will be mailed to home address when it is time to make this appointment.  Thank you for choosing [facility]!!   Trinidad Curet, RN [phone]  Any Other Special Instructions Will Be Listed Below (If Applicable). Carvedilol tablets What is this medicine? CARVEDILOL (KAR ve dil ol) is a beta-blocker. Beta-blockers reduce the workload on the heart and help it to beat more regularly. This medicine is used to treat high blood pressure and heart failure. This medicine may be used for other purposes; ask your health care provider or pharmacist if you have questions. COMMON BRAND NAME(S): Coreg What should I tell my health care provider before I take this medicine? They need to know if you have any of these conditions:  circulation problems  diabetes  history of heart attack or heart disease  liver disease  lung or breathing disease, like asthma or emphysema  pheochromocytoma  slow or irregular heartbeat  thyroid disease  an unusual or allergic  reaction to carvedilol, other beta-blockers, medicines, foods, dyes, or preservatives  pregnant or trying to get pregnant  breast-feeding How should I use this medicine? Take this medicine by mouth with a glass of water. Follow the directions on the prescription label. It is best to take the tablets with food. Take your doses at regular intervals. Do not take your medicine more often than directed. Do not stop taking except on the advice of your doctor or health care professional. Talk to your pediatrician regarding the use of this medicine in children. Special care may be needed. Overdosage: If you think you have taken too much of this medicine contact a poison control center or emergency room at once. NOTE: This medicine is only for you. Do not share this medicine with others. What if I miss a dose? If you miss a dose, take it as soon as you can. If it is almost time for your next dose, take only that dose. Do not take double or extra doses. What may interact with this medicine? This medicine may interact with the following medications:  certain medicines for blood pressure, heart disease, irregular heart beat  certain medicines for depression, like fluoxetine or paroxetine  certain medicines for diabetes, like glipizide or glyburide  cimetidine  clonidine  cyclosporine  digoxin  MAOIs like Carbex, Eldepryl, Marplan, Nardil, and Parnate  reserpine  rifampin This list may not describe all possible interactions. Give your health care provider a list of all the medicines, herbs, non-prescription drugs, or dietary supplements you use. Also tell them if you smoke, drink alcohol, or use illegal drugs. Some items may interact  with your medicine. What should I watch for while using this medicine? Check your heart rate and blood pressure regularly while you are taking this medicine. Ask your doctor or health care professional what your heart rate and blood pressure should be, and when  you should contact him or her. Do not stop taking this medicine suddenly. This could lead to serious heart-related effects. Contact your doctor or health care professional if you have difficulty breathing while taking this drug. Check your weight daily. Ask your doctor or health care professional when you should notify him/her of any weight gain. You may get drowsy or dizzy. Do not drive, use machinery, or do anything that requires mental alertness until you know how this medicine affects you. To reduce the risk of dizzy or fainting spells, do not sit or stand up quickly. Alcohol can make you more drowsy, and increase flushing and rapid heartbeats. Avoid alcoholic drinks. This medicine may increase blood sugar. Ask your healthcare provider if changes in diet or medicines are needed if you have diabetes. If you are going to have surgery, tell your doctor or health care professional that you are taking this medicine. What side effects may I notice from receiving this medicine? Side effects that you should report to your doctor or health care professional as soon as possible:  allergic reactions like skin rash, itching or hives, swelling of the face, lips, or tongue  breathing problems  dark urine  irregular heartbeat   signs and symptoms of high blood sugar such as being more thirsty or hungry or having to urinate more than normal. You may also feel very tired or have blurry vision.  swollen legs or ankles  vomiting  yellowing of the eyes or skin Side effects that usually do not require medical attention (report to your doctor or health care professional if they continue or are bothersome):  change in sex drive or performance  diarrhea  dry eyes (especially if wearing contact lenses)  dry, itching skin  headache  nausea  unusually tired This list may not describe all possible side effects. Call your doctor for medical advice about side effects. You may report side effects to FDA  at 1-800-FDA-1088. Where should I keep my medicine? Keep out of the reach of children. Store at room temperature below 30 degrees C (86 degrees F). Protect from moisture. Keep container tightly closed. Throw away any unused medicine after the expiration date. NOTE: This sheet is a summary. It may not cover all possible information. If you have questions about this medicine, talk to your doctor, pharmacist, or health care provider.  2020 [employer]vier/Gold Standard (2018-05-28 09:13:57)

## 2019-06-01 NOTE — Progress Notes (Signed)
Electrophysiology Office Note   Date:  06/01/2019   ID:  Brianna Porter, DOB 1961-02-24, MRN [id number]  PCP:  Cher Nakai, MD  Cardiologist:  Bettina Gavia Primary Electrophysiologist:  Taquana Bartley Meredith Leeds, MD    Chief Complaint: AF   History of Present Illness: Brianna Porter is a 58 y.o. female who is being seen today for the evaluation of af at the request of Cher Nakai, MD. Presenting today for electrophysiology evaluation.  She has a history of hyperthyroidism, persistent atrial fibrillation, hypertension, and diabetes.  She was recently found to be in atrial fibrillation with rapid rates.  She was started on Xarelto and scheduled for TEE and cardioversion September 2020.  At the time of her TEE she was found to have new LV systolic dysfunction.  She underwent successful cardioversion and was admitted to the hospital for work-up of CHF and antiarrhythmic drug initiation.  She had recurrent atrial fibrillation and was started on amiodarone.  She reverted back to sinus rhythm.  She feels palpitations when she is in atrial fibrillation.  Today, she denies symptoms of palpitations, chest pain, shortness of breath, orthopnea, PND, lower extremity edema, claudication, dizziness, presyncope, syncope, bleeding, or neurologic sequela. The patient is tolerating medications without difficulties.  She feels much improved since being in the hospital.  She does not have weakness, fatigue, or shortness of breath.  It appears that she has maintained sinus rhythm.   Past Medical History:  Diagnosis Date   Anxiety and depression    Atrial flutter (HCC)    Chronic systolic CHF (congestive heart failure) ([place])    a. dx in setting of atrial fib/flutter - possibly tachy mediated. Coronary CTA with only mild CAD in 04/2019.   CKD (chronic kidney disease), stage II    Confusion    a. persistent confusion during 04/2019 admission of unclear cause. Home meds adjusted. CT/MRI brain nonacute.   Diabetes  ([place])    Edema    Elevated liver function tests    Hypercholesteremia    Hyperkalemia    Hypertension    Hypertensive heart and chronic kidney disease with systolic congestive heart failure (HCC)    Hyperthyroidism    Hypokalemia    Hypokalemia    Hypomagnesemia    Hyponatremia    Iron deficiency anemia    Mild CAD    a. Coronary CT 04/2019 - Minimal, Non-obstructive CAD.   NICM (nonischemic cardiomyopathy) ([place])    Persistent atrial fibrillation (HCC)    Prolonged QT interval    Past Surgical History:  Procedure Laterality Date   CARDIOVERSION N/A 04/29/2019   Procedure: CARDIOVERSION;  Surgeon: Sanda Klein, MD;  Location: [place];  Service: Cardiovascular;  Laterality: N/A;   CARDIOVERSION N/A 05/04/2019   Procedure: CARDIOVERSION;  Surgeon: Josue Hector, MD;  Location: [facility] ENDOSCOPY;  Service: Cardiovascular;  Laterality: N/A;   TEE WITHOUT CARDIOVERSION  04/29/2019   TEE WITHOUT CARDIOVERSION N/A 04/29/2019   Procedure: TRANSESOPHAGEAL ECHOCARDIOGRAM (TEE);  Surgeon: Sanda Klein, MD;  Location: [facility] ENDOSCOPY;  Service: Cardiovascular;  Laterality: N/A;   TUBAL LIGATION       Current Outpatient Medications  Medication Sig Dispense Refill   ALPRAZolam (XANAX) 0.5 MG tablet Take 0.5 mg by mouth 3 (three) times daily as needed for anxiety.      amiodarone (PACERONE) 200 MG tablet Take 2 tablets (400mg ) by mouth twice daily through 9/21. On 9/22, decrease to 1 tablet twice a day. 70 tablet 1   Ascorbic Acid (VITAMIN C) 1000 MG tablet  Take 1,000 mg by mouth daily.     atorvastatin (LIPITOR) 40 MG tablet Take 1 tablet (40 mg total) by mouth daily. 30 tablet 6   cetirizine (ZYRTEC) 10 MG tablet Take 10 mg by mouth as needed for allergies.     cholecalciferol (VITAMIN D3) 25 MCG (1000 UT) tablet Take 1,000 Units by mouth daily.     DULoxetine (CYMBALTA) 60 MG capsule Take 60 mg by mouth 2 (two) times daily.     fenofibrate (TRICOR) 48 MG tablet  Take 48 mg by mouth at bedtime.     furosemide (LASIX) 40 MG tablet Take 1 tablet (40 mg total) by mouth 2 (two) times daily. 60 tablet 3   Menthol, Topical Analgesic, (ICY HOT ADVANCED RELIEF EX) Apply 1 application topically as needed (back pain).     methimazole (TAPAZOLE) 5 MG tablet Take 5 mg by mouth daily.      potassium chloride SA (KLOR-CON) 20 MEQ tablet Take 20 mEq by mouth daily.     rivaroxaban (XARELTO) 20 MG TABS tablet Take 1 tablet (20 mg total) by mouth daily with supper. 30 tablet 6   spironolactone (ALDACTONE) 25 MG tablet Take 0.5 tablets (12.5 mg total) by mouth daily. 15 tablet 6   carvedilol (COREG) 6.25 MG tablet Take 1 tablet (6.25 mg total) by mouth 2 (two) times daily. 60 tablet 3   losartan (COZAAR) 50 MG tablet Take 1 tablet (50 mg total) by mouth daily. 30 tablet 6   No current facility-administered medications for this visit.     Allergies:   Ambien [zolpidem tartrate]   Social History:  The patient  reports that she quit smoking about 19 years ago. Her smoking use included cigarettes. She has a 60.00 pack-year smoking history. She has never used smokeless tobacco. She reports that she does not drink alcohol or use drugs.   Family History:  The patient's family history includes Cancer in her mother; Heart disease in her father and mother; Hypercholesterolemia in her brother and mother; Osteoarthritis in her mother; Stroke in her mother.    ROS:  Please see the history of present illness.   Otherwise, review of systems is positive for none.   All other systems are reviewed and negative.    PHYSICAL EXAM: VS:  BP (!) 169/86    Pulse (!) 106    Ht 5\' 3"  (1.6 m)    Wt 130 lb 9.6 oz (59.2 kg)    LMP  (LMP Unknown) Comment: ?10 years ago?   BMI 23.13 kg/m  , BMI Body mass index is 23.13 kg/m. GEN: Well nourished, well developed, in no acute distress  HEENT: normal  Neck: no JVD, carotid bruits, or masses Cardiac: RRR; no murmurs, rubs, or gallops,no  edema  Respiratory:  clear to auscultation bilaterally, normal work of breathing GI: soft, nontender, nondistended, + BS MS: no deformity or atrophy  Skin: warm and dry Neuro:  Strength and sensation are intact Psych: euthymic mood, full affect  EKG:  EKG is ordered today. Personal review of the ekg ordered shows sinus tachycardia, rate 106, PVCs   Recent Labs: 04/23/2019: NT-Pro BNP 3,353 04/29/2019: ALT 63 04/30/2019: TSH 3.246 05/05/2019: Hemoglobin 13.6; Platelets 428 05/07/2019: BUN 10; Creatinine, Ser 1.01; Magnesium 1.8; Potassium 3.5; Sodium 129    Lipid Panel  No results found for: CHOL, TRIG, HDL, CHOLHDL, VLDL, LDLCALC, LDLDIRECT   Wt Readings from Last 3 Encounters:  06/01/19 130 lb 9.6 oz (59.2 kg)  05/06/19 125 lb  4.8 oz (56.8 kg)  04/23/19 139 lb (63 kg)      Other studies Reviewed: Additional studies/ records that were reviewed today include: TEE 04/29/19  Review of the above records today demonstrates:   1. The left ventricle has severely reduced systolic function, with an ejection fraction of 20-25%. The cavity size was normal. Left ventrical global hypokinesis without regional wall motion abnormalities.  2. The right ventricle has severely reduced systolic function. The cavity was normal. There is no increase in right ventricular wall thickness. Right ventricular systolic pressure is normal.  3. No evidence of a thrombus present in the left atrial appendage.  4. Mild mitral insufficiency. The jet is centrally-directed.  5. The aortic root, ascending aorta, aortic arch and descending aorta are normal in size and structure.  6. No intracardiac thrombi or masses were visualized.  Coronary CT 05/06/2019 1. Coronary calcium score of 51. This was 68 percentile for age and sex matched control. 2. Normal coronary origin with right dominance. 3. Minimal, Non-obstructive CAD (<25%). 4. Atherosclerosis noted in the aortic root/ascending/descending Aorta.  ASSESSMENT AND  PLAN:  1.  Chronic systolic heart failure due to nonischemic cardiomyopathy: She was found to be in rapid atrial fibrillation and it is certainly possible that she has a tachycardia mediated cardiomyopathy.  She is currently on amiodarone and Xarelto.  We Rocklyn Mayberry check an echo in a few months to see if her ejection fraction has improved.  If it has, and she remained stable, she would likely benefit from ablation as she is quite young for amiodarone.  I am going to start her on carvedilol for optimal heart failure therapy and increase her losartan today.  She does have follow-up with Dr. Bettina Gavia in the future.  He can make further adjustments at that time.  I Tessi Eustache also schedule an echo for 3 months.   2.  Persistent atrial fibrillation: Patient currently on amiodarone and Xarelto.  It is certainly possible that she has a tachycardia mediated cardiomyopathy as above.  She would potentially benefit from ablation in the future.  We Cylas Falzone continue amiodarone for now.  This patients CHA2DS2-VASc Score and unadjusted Ischemic Stroke Rate (% per year) is equal to 3.2 % stroke rate/year from a score of 3  Above score calculated as 1 point each if present [CHF, HTN, DM, Vascular=MI/PAD/Aortic Plaque, Age if 65-74, or Female] Above score calculated as 2 points each if present [Age > 75, or Stroke/TIA/TE]  Current medicines are reviewed at length with the patient today.   The patient does not have concerns regarding her medicines.  The following changes were made today: Start carvedilol, increase losartan  Labs/ tests ordered today include:  Orders Placed This Encounter  Procedures   EKG 12-Lead   ECHOCARDIOGRAM COMPLETE     Disposition:   FU with Srinidhi Landers 3 months  Signed, Lawanna Cecere Meredith Leeds, MD  06/01/2019 3:39 PM     [facility] [address] [place] [address] [phone] (office) [phone] (fax)

## 2019-06-02 DIAGNOSIS — I4892 Unspecified atrial flutter: Secondary | ICD-10-CM | POA: Diagnosis not present

## 2019-06-02 DIAGNOSIS — R809 Proteinuria, unspecified: Secondary | ICD-10-CM | POA: Diagnosis not present

## 2019-06-02 DIAGNOSIS — E7800 Pure hypercholesterolemia, unspecified: Secondary | ICD-10-CM | POA: Diagnosis not present

## 2019-06-02 DIAGNOSIS — R7989 Other specified abnormal findings of blood chemistry: Secondary | ICD-10-CM | POA: Diagnosis not present

## 2019-06-02 DIAGNOSIS — E119 Type 2 diabetes mellitus without complications: Secondary | ICD-10-CM | POA: Diagnosis not present

## 2019-06-02 DIAGNOSIS — J309 Allergic rhinitis, unspecified: Secondary | ICD-10-CM | POA: Diagnosis not present

## 2019-06-02 DIAGNOSIS — I482 Chronic atrial fibrillation, unspecified: Secondary | ICD-10-CM | POA: Diagnosis not present

## 2019-06-02 DIAGNOSIS — I10 Essential (primary) hypertension: Secondary | ICD-10-CM | POA: Diagnosis not present

## 2019-06-02 DIAGNOSIS — E0590 Thyrotoxicosis, unspecified without thyrotoxic crisis or storm: Secondary | ICD-10-CM | POA: Diagnosis not present

## 2019-06-02 DIAGNOSIS — R609 Edema, unspecified: Secondary | ICD-10-CM | POA: Diagnosis not present

## 2019-06-02 DIAGNOSIS — Z23 Encounter for immunization: Secondary | ICD-10-CM | POA: Diagnosis not present

## 2019-06-05 ENCOUNTER — Telehealth: Payer: Self-pay | Admitting: Cardiology

## 2019-06-05 NOTE — Telephone Encounter (Signed)
Called and spoke with patient's daughter, Albina Billet, per DPR who explained that patient took two doses of carvedilol 6.25 mg that Dr. Curt Bears prescribed during the office visit on 06/01/2019 and has not felt well since. Albina Billet reports that they called EMS today due to patient having chest pain and an anxiety attack. They did an EKG that was normal before leaving. Albina Billet states patient does not want to take carvedilol anymore. Claris Che to call Dr. Macky Lower office on Monday, 06/08/2019 for further recommendations regarding medication management and if her symptoms worsen to go to the emergency department for further evaluation. Albina Billet verbalized understanding and is agreeable to plan. No further questions.

## 2019-06-05 NOTE — Telephone Encounter (Signed)
Pt. Started a new medicine and she has been sick every since. Pt. Is requesting a call back

## 2019-06-06 DIAGNOSIS — J309 Allergic rhinitis, unspecified: Secondary | ICD-10-CM | POA: Diagnosis not present

## 2019-06-06 DIAGNOSIS — E7800 Pure hypercholesterolemia, unspecified: Secondary | ICD-10-CM | POA: Diagnosis not present

## 2019-06-06 DIAGNOSIS — R0600 Dyspnea, unspecified: Secondary | ICD-10-CM | POA: Diagnosis not present

## 2019-06-06 DIAGNOSIS — R7989 Other specified abnormal findings of blood chemistry: Secondary | ICD-10-CM | POA: Diagnosis not present

## 2019-06-06 DIAGNOSIS — I10 Essential (primary) hypertension: Secondary | ICD-10-CM | POA: Diagnosis not present

## 2019-06-06 DIAGNOSIS — I4892 Unspecified atrial flutter: Secondary | ICD-10-CM | POA: Diagnosis not present

## 2019-06-06 DIAGNOSIS — E0590 Thyrotoxicosis, unspecified without thyrotoxic crisis or storm: Secondary | ICD-10-CM | POA: Diagnosis not present

## 2019-06-06 DIAGNOSIS — I482 Chronic atrial fibrillation, unspecified: Secondary | ICD-10-CM | POA: Diagnosis not present

## 2019-06-06 DIAGNOSIS — R609 Edema, unspecified: Secondary | ICD-10-CM | POA: Diagnosis not present

## 2019-06-06 DIAGNOSIS — R809 Proteinuria, unspecified: Secondary | ICD-10-CM | POA: Diagnosis not present

## 2019-06-09 DIAGNOSIS — M25552 Pain in left hip: Secondary | ICD-10-CM | POA: Diagnosis not present

## 2019-06-09 DIAGNOSIS — R809 Proteinuria, unspecified: Secondary | ICD-10-CM | POA: Diagnosis not present

## 2019-06-09 DIAGNOSIS — E7800 Pure hypercholesterolemia, unspecified: Secondary | ICD-10-CM | POA: Diagnosis not present

## 2019-06-09 DIAGNOSIS — I4892 Unspecified atrial flutter: Secondary | ICD-10-CM | POA: Diagnosis not present

## 2019-06-09 DIAGNOSIS — R0600 Dyspnea, unspecified: Secondary | ICD-10-CM | POA: Diagnosis not present

## 2019-06-09 DIAGNOSIS — J309 Allergic rhinitis, unspecified: Secondary | ICD-10-CM | POA: Diagnosis not present

## 2019-06-09 DIAGNOSIS — R609 Edema, unspecified: Secondary | ICD-10-CM | POA: Diagnosis not present

## 2019-06-09 DIAGNOSIS — I10 Essential (primary) hypertension: Secondary | ICD-10-CM | POA: Diagnosis not present

## 2019-06-09 DIAGNOSIS — R7989 Other specified abnormal findings of blood chemistry: Secondary | ICD-10-CM | POA: Diagnosis not present

## 2019-06-09 DIAGNOSIS — I482 Chronic atrial fibrillation, unspecified: Secondary | ICD-10-CM | POA: Diagnosis not present

## 2019-06-12 ENCOUNTER — Other Ambulatory Visit: Payer: Self-pay

## 2019-06-12 ENCOUNTER — Ambulatory Visit (INDEPENDENT_AMBULATORY_CARE_PROVIDER_SITE_OTHER): Payer: Medicare HMO | Admitting: Family

## 2019-06-12 ENCOUNTER — Encounter: Payer: Self-pay | Admitting: Family

## 2019-06-12 VITALS — BP 122/80 | HR 95 | Ht 63.0 in | Wt 130.0 lb

## 2019-06-12 DIAGNOSIS — R0600 Dyspnea, unspecified: Secondary | ICD-10-CM | POA: Diagnosis not present

## 2019-06-12 DIAGNOSIS — Z7901 Long term (current) use of anticoagulants: Secondary | ICD-10-CM | POA: Diagnosis not present

## 2019-06-12 DIAGNOSIS — J0100 Acute maxillary sinusitis, unspecified: Secondary | ICD-10-CM | POA: Diagnosis not present

## 2019-06-12 DIAGNOSIS — I2510 Atherosclerotic heart disease of native coronary artery without angina pectoris: Secondary | ICD-10-CM

## 2019-06-12 DIAGNOSIS — R609 Edema, unspecified: Secondary | ICD-10-CM | POA: Diagnosis not present

## 2019-06-12 DIAGNOSIS — J309 Allergic rhinitis, unspecified: Secondary | ICD-10-CM | POA: Diagnosis not present

## 2019-06-12 DIAGNOSIS — R7989 Other specified abnormal findings of blood chemistry: Secondary | ICD-10-CM | POA: Diagnosis not present

## 2019-06-12 DIAGNOSIS — I10 Essential (primary) hypertension: Secondary | ICD-10-CM | POA: Diagnosis not present

## 2019-06-12 DIAGNOSIS — I5020 Unspecified systolic (congestive) heart failure: Secondary | ICD-10-CM | POA: Diagnosis not present

## 2019-06-12 DIAGNOSIS — I4891 Unspecified atrial fibrillation: Secondary | ICD-10-CM | POA: Diagnosis not present

## 2019-06-12 DIAGNOSIS — R809 Proteinuria, unspecified: Secondary | ICD-10-CM | POA: Diagnosis not present

## 2019-06-12 DIAGNOSIS — N183 Chronic kidney disease, stage 3 unspecified: Secondary | ICD-10-CM

## 2019-06-12 DIAGNOSIS — E7800 Pure hypercholesterolemia, unspecified: Secondary | ICD-10-CM | POA: Diagnosis not present

## 2019-06-12 DIAGNOSIS — I482 Chronic atrial fibrillation, unspecified: Secondary | ICD-10-CM | POA: Diagnosis not present

## 2019-06-12 DIAGNOSIS — I4819 Other persistent atrial fibrillation: Secondary | ICD-10-CM

## 2019-06-12 DIAGNOSIS — E0590 Thyrotoxicosis, unspecified without thyrotoxic crisis or storm: Secondary | ICD-10-CM | POA: Diagnosis not present

## 2019-06-12 MED ORDER — ENTRESTO 24-26 MG PO TABS: 1.0000 | ORAL_TABLET | Freq: Two times a day (BID) | ORAL | 2 refills | Status: DC

## 2019-06-12 NOTE — Progress Notes (Signed)
Office Visit    Patient Name: Brianna Porter Date of Encounter: 06/13/2019  Primary Care Provider:  Cher Nakai, MD Primary Cardiologist:  Shirlee More, MD Electrophysiologist:  None   Chief Complaint    Brianna Porter is a 58 y.o. female with a hx of atrial fibrillation on amiodarone and anticoagulation, systolic heart failure (EF 20-25%), hyperthyroidism treated with methimazole, CKDIII, HTN, diabetes presents today for follow up of heart failure.   Past Medical History    Past Medical History:  Diagnosis Date  . Anxiety and depression   . Atrial flutter ([place])   . Chronic systolic CHF (congestive heart failure) (HCC)    a. dx in setting of atrial fib/flutter - possibly tachy mediated. Coronary CTA with only mild CAD in 04/2019.  Marland Kitchen CKD (chronic kidney disease), stage II   . Confusion    a. persistent confusion during 04/2019 admission of unclear cause. Home meds adjusted. CT/MRI brain nonacute.  . Diabetes ([place])   . Edema   . Elevated liver function tests   . Hypercholesteremia   . Hyperkalemia   . Hypertension   . Hypertensive heart and chronic kidney disease with systolic congestive heart failure ([place])   . Hyperthyroidism   . Hypokalemia   . Hypokalemia   . Hypomagnesemia   . Hyponatremia   . Iron deficiency anemia   . Mild CAD    a. Coronary CT 04/2019 - Minimal, Non-obstructive CAD.  Marland Kitchen NICM (nonischemic cardiomyopathy) ([place])   . Persistent atrial fibrillation ([place])   . Prolonged QT interval    Past Surgical History:  Procedure Laterality Date  . CARDIOVERSION N/A 04/29/2019   Procedure: CARDIOVERSION;  Surgeon: Sanda Klein, MD;  Location: [place] ENDOSCOPY;  Service: Cardiovascular;  Laterality: N/A;  . CARDIOVERSION N/A 05/04/2019   Procedure: CARDIOVERSION;  Surgeon: Josue Hector, MD;  Location: [facility] ENDOSCOPY;  Service: Cardiovascular;  Laterality: N/A;  . TEE WITHOUT CARDIOVERSION  04/29/2019  . TEE WITHOUT CARDIOVERSION N/A 04/29/2019   Procedure:  TRANSESOPHAGEAL ECHOCARDIOGRAM (TEE);  Surgeon: Sanda Klein, MD;  Location: [place];  Service: Cardiovascular;  Laterality: N/A;  . TUBAL LIGATION      Allergies  Allergies  Allergen Reactions  . Ambien [Zolpidem Tartrate]     Pt and family state this med makes the patient sleepwalk.    History of Present Illness    Brianna Porter is a 58 y.o. female with a hx of atrial fibrillation on amiodarone and anticoagulation, systolic heart failure (EF 20-25%), hyperthyroidism treated with methimazole, CKDIII, HTN, diabetes last seen by EP Dr. Curt Bears 06/01/19 and by cardiology while hospitalized.  She was seen by Dr. Bettina Gavia 04/23/2019.  Noted to have increasing edema and worsening heart failure.  She was switched to Xarelto from Eliquis due to pruritus.    Presented to [facility]/29/[facility] for TEE with LVEF 20-25%, successful cardioversion. Admitted for new HF dx. Admission complicated by early return of rapid afib, confusion/encephalopathy of unclear origin, hypokalemia/hypomagnesia, persistent hyponatremia. EP consulted, recommended continue amiodarone and deemed poor candidate for ablation. Underwent successful DCCV. CTA with FFR done due to ischemic ST changes post DCCV in anterolateral leads 05/05/19 showed Ca score 51, nonobstuctive CAD in LAD and LCx. Simvastatin was changed to Atorvastatin due to interaction with amiodarone. Her CM was felt to be tachy-mediated due to afib. During hospitalization IM followed for endocrine workup due to weight loss, confusion, hyponatremia. CT head and MRI brain unrevealing for acute cause.  Reports feeling overall well since hospital discharge however in  the last week she has noticed return of fatigue.  Tells me she gets worn out easily with activities that she has normally been able to do since hospital discharge.  She is concerned about what this means and does not want to have to go back to the hospital.  She reports no recurrence of palpitations, irregular  heartbeats.  No SOB at rest, no edema, no orthopnea, no PND.  EKGs/Labs/Other Studies Reviewed:   The following studies were reviewed today: Echo TEE 04/29/19 1. The left ventricle has severely reduced systolic function, with an ejection fraction of 20-25%. The cavity size was normal. Left ventrical global hypokinesis without regional wall motion abnormalities.  2. The right ventricle has severely reduced systolic function. The cavity was normal. There is no increase in right ventricular wall thickness. Right ventricular systolic pressure is normal.  3. No evidence of a thrombus present in the left atrial appendage.  4. Mild mitral insufficiency. The jet is centrally-directed.  5. The aortic root, ascending aorta, aortic arch and descending aorta are normal in size and structure.  6. No intracardiac thrombi or masses were visualized.     EKG:  No EKG today.  Recent Labs: 04/29/2019: ALT 63 04/30/2019: TSH 3.246 05/05/2019: Hemoglobin 13.6; Platelets 428 05/07/2019: Magnesium 1.8 06/12/2019: BUN 29; Creatinine, Ser 2.18; NT-Pro BNP 12,215; Potassium 6.2; Sodium 128  Recent Lipid Panel No results found for: CHOL, TRIG, HDL, CHOLHDL, VLDL, LDLCALC, LDLDIRECT  Home Medications   Current Meds  Medication Sig  . albuterol (VENTOLIN HFA) 108 (90 Base) MCG/ACT inhaler SMARTSIG:2 Puff(s) By Mouth Every 4 Hours PRN  . ALPRAZolam (XANAX) 0.5 MG tablet Take 0.5 mg by mouth 3 (three) times daily as needed for anxiety.   Marland Kitchen amiodarone (PACERONE) 200 MG tablet Take 200 mg by mouth 2 (two) times daily.  . Ascorbic Acid (VITAMIN C) 1000 MG tablet Take 1,000 mg by mouth daily.  . cetirizine (ZYRTEC) 10 MG tablet Take 10 mg by mouth as needed for allergies.  . cholecalciferol (VITAMIN D3) 25 MCG (1000 UT) tablet Take 1,000 Units by mouth daily.  . DULoxetine (CYMBALTA) 60 MG capsule Take 60 mg by mouth 2 (two) times daily.  . fenofibrate (TRICOR) 48 MG tablet Take 48 mg by mouth at bedtime.  . furosemide  (LASIX) 40 MG tablet Take 1 tablet (40 mg total) by mouth 2 (two) times daily.  . Menthol, Topical Analgesic, (ICY HOT ADVANCED RELIEF EX) Apply 1 application topically as needed (back pain).  . methimazole (TAPAZOLE) 5 MG tablet Take 5 mg by mouth daily.   . metoprolol succinate (TOPROL-XL) 100 MG 24 hr tablet Take 50 mg by mouth daily.  . potassium chloride SA (KLOR-CON) 20 MEQ tablet Take 20 mEq by mouth daily.  . rivaroxaban (XARELTO) 20 MG TABS tablet Take 1 tablet (20 mg total) by mouth daily with supper.  Marland Kitchen spironolactone (ALDACTONE) 25 MG tablet Take 0.5 tablets (12.5 mg total) by mouth daily.  . traMADol (ULTRAM) 50 MG tablet Take 50 mg by mouth 4 (four) times daily as needed.  . [DISCONTINUED] losartan (COZAAR) 50 MG tablet Take 1 tablet (50 mg total) by mouth daily.      Review of Systems    Review of Systems  Constitution: Positive for malaise/fatigue. Negative for chills and fever.  Cardiovascular: Positive for dyspnea on exertion. Negative for chest pain, irregular heartbeat, leg swelling, near-syncope, orthopnea and palpitations.  Respiratory: Negative for cough, shortness of breath and wheezing.   Gastrointestinal: Negative for  nausea and vomiting.  Neurological: Negative for dizziness, light-headedness and weakness.   All other systems reviewed and are otherwise negative except as noted above.  Physical Exam    VS:  BP 122/80 (BP Location: Right Arm, Patient Position: Sitting, Cuff Size: Normal)   Pulse 95   Ht 5\' 3"  (1.6 m)   Wt 130 lb (59 kg)   LMP  (LMP Unknown) Comment: ?10 years ago?  SpO2 95%   BMI 23.03 kg/m  , BMI Body mass index is 23.03 kg/m. GEN: Well nourished, well developed, in no acute distress. HEENT: normal. Neck: Supple, no JVD, carotid bruits, or masses. Cardiac: RRR, no murmurs, rubs, or gallops. No clubbing, cyanosis, edema.  Radials/DP/PT 2+ and equal bilaterally.  Respiratory:  Respirations regular and unlabored, clear to auscultation  bilaterally. GI: Soft, nontender, nondistended, BS + x 4. MS: No deformity or atrophy. Skin: Warm and dry, no rash.  Scattered ecchymosis on bilateral upper extremity. Neuro:  Strength and sensation are intact. Psych: Normal affect.  Assessment & Plan    1. Fatigue- Onset over the last week.  Reports she gets fatigued associated with some DOE with less than normal activity.  Etiology recent beta-blocker change versus heart failure.  Recently changed from metoprolol to carvedilol with intolerance and switch back to metoprolol.  Decrease her beta-blocker dose by half today.  Optimize GDMT by switching from losartan to [facility] today for heart failure.  She reports no bleeding complications and has good color on exam, low suspicion anemia.  proBNP, BMP today.  2. Systolic heart failure-TEE 04/29/2019 with EF 20 to 25%.  Euvolemic on exam today with no lower extremity edema.  NYHA class II.  Plan to continue to optimize GDMT for heart failure.  Stop losartan.  Start Entresto 24/26 mg twice daily.  Continue beta-blocker, ARB, loop diuretic.  BMP and proBNP today.  Anticipate recheck of echo in 3 to 6 months with correction of her atrial fibrillation as her reduced EF was thought to be tachycardia mediated.  3. Paroxysmal atrial fibrillation- Follows with EP.  Heart rate regular today.  Denies palpitations.  Continue amiodarone twice daily.  Long discussion regarding mechanism of antiarrhythmic versus rate limiting medication of metoprolol.  She was intolerant of carvedilol and switched back to metoprolol by her PCP.  Anticoagulated on Xarelto.  4. Chronic anticoagulation-secondary to PAF.  She was intolerant to Eliquis.  She reports tolerating Xarelto 20 mg well.  She was switched from Xarelto 15 mg to Xarelto 20 mg by pharmacy while hospitalized due to improvement in her creatinine clearance.  We will need to monitor carefully.  She reports no bleeding complications.  5. Nonobstructive coronary artery  disease-cardiac CTA 05/05/2019 with minimal nonobstructive CAD (0 to 24%).  Continue beta-blocker.  See discussion regarding statin below, will address it upcoming office visits.  No aspirin secondary to chronic anticoagulation.  She reports no chest pain.  6. Hyperlipidemia- previously on simvastatin but was switched to atorvastatin due to interaction with amiodarone.  Reports she was intolerant atorvastatin. LDL goal less than 70.  Will require updated fasting lipid profile to see how aggressive we need to be with statin therapies.  Plan to complete at next office visit as hesitant to make multiple medication changes at one time.  7. CKD III - Careful titration of diuretics and medications. Creatinine/GFR stable at time of hospital discharge. Recheck today.   Disposition: BMET, ProBNP today. Follow up in 4 week(s) with Dr. Bettina Gavia or Laurann Montana, NP.  Loel Dubonnet, NP 06/13/2019, 11:29 AM

## 2019-06-12 NOTE — Patient Instructions (Signed)
Medication Instructions:  Your physician has recommended you make the following change in your medication:   DECREASE:  Metoprolol dose to 50 mg daily  START: Entresto 24/26MG  Twice Daily  STOP: Losartan  *If you need a refill on your cardiac medications before your next appointment, please call your pharmacy*  Lab Work: Your physician recommends that you return for lab work Today:  BMET Pro BNP  If you have labs (blood work) drawn today and your tests are completely normal, you will receive your results only by: Marland Kitchen MyChart Message (if you have MyChart) OR . A paper copy in the mail If you have any lab test that is abnormal or we need to change your treatment, we will call you to review the results.  Testing/Procedures: None Ordered  Follow-Up: At [facility], you and your health needs are our priority.  As part of our continuing mission to provide you with exceptional heart care, we have created designated Provider Care Teams.  These Care Teams include your primary Cardiologist (physician) and Advanced Practice Providers (APPs -  Physician Assistants and Nurse Practitioners) who all work together to provide you with the care you need, when you need it.  Your next appointment:   4 weeks  The format for your next appointment:   In Person  Provider:   You may see Shirlee More, MD or the following Advanced Practice Provider on your designated Care Team:    Laurann Montana, FNP   Other Instructions  Please Call us and  Report Heart Rate above 90 or below 60 continuously  And/Or Systolic blood pressure above 130 or below 110 Sacubitril; Valsartan oral tablet What is this medicine? SACUBITRIL; VALSARTAN (sak UE bi tril; val SAR tan) is a combination of 2 drugs used to reduce the risk of death and hospitalizations in people with long-lasting heart failure. It is usually used with other medicines to treat heart failure. This medicine may be used for other purposes; ask your  health care provider or pharmacist if you have questions. COMMON BRAND NAME(S): Entresto What should I tell my health care provider before I take this medicine? They need to know if you have any of these conditions:  diabetes and take a medicine that contains aliskiren  kidney disease  liver disease  an unusual or allergic reaction to sacubitril; valsartan, drugs called angiotensin converting enzyme (ACE) inhibitors, angiotensin II receptor blockers (ARBs), other medicines, foods, dyes, or preservatives  pregnant or trying to get pregnant  breast-feeding How should I use this medicine? Take this medicine by mouth with a glass of water. Follow the directions on the prescription label. You can take it with or without food. If it upsets your stomach, take it with food. Take your medicine at regular intervals. Do not take it more often than directed. Do not stop taking except on your doctor's advice. Do not take this medicine for at least 36 hours before or after you take an ACE inhibitor medicine. Talk to your health care provider if you are not sure if you take an ACE inhibitor. Talk to your pediatrician regarding the use of this medicine in children. Special care may be needed. Overdosage: If you think you have taken too much of this medicine contact a poison control center or emergency room at once. NOTE: This medicine is only for you. Do not share this medicine with others. What if I miss a dose? If you miss a dose, take it as soon as you can. If it  is almost time for next dose, take only that dose. Do not take double or extra doses. What may interact with this medicine? Do not take this medicine with any of the following medicines:  aliskiren if you have diabetes  angiotensin-converting enzyme (ACE) inhibitors, like benazepril, captopril, enalapril, fosinopril, lisinopril, or ramipril This medicine may also interact with the following medicines:  angiotensin II receptor blockers  (ARBs) like azilsartan, candesartan, eprosartan, irbesartan, losartan, olmesartan, telmisartan, or valsartan  lithium  NSAIDS, medicines for pain and inflammation, like ibuprofen or naproxen  potassium-sparing diuretics like amiloride, spironolactone, and triamterene  potassium supplements This list may not describe all possible interactions. Give your health care provider a list of all the medicines, herbs, non-prescription drugs, or dietary supplements you use. Also tell them if you smoke, drink alcohol, or use illegal drugs. Some items may interact with your medicine. What should I watch for while using this medicine? Tell your doctor or healthcare professional if your symptoms do not start to get better or if they get worse. Do not become pregnant while taking this medicine. Women should inform their doctor if they wish to become pregnant or think they might be pregnant. There is a potential for serious side effects to an unborn child. Talk to your health care professional or pharmacist for more information. You may get dizzy. Do not drive, use machinery, or do anything that needs mental alertness until you know how this medicine affects you. Do not stand or sit up quickly, especially if you are an older patient. This reduces the risk of dizzy or fainting spells. Avoid alcoholic drinks; they can make you more dizzy. What side effects may I notice from receiving this medicine? Side effects that you should report to your doctor or health care professional as soon as possible:  allergic reactions like skin rash, itching or hives, swelling of the face, lips, or tongue  signs and symptoms of increased potassium like muscle weakness; chest pain; or fast, irregular heartbeat  signs and symptoms of kidney injury like trouble passing urine or change in the amount of urine  signs and symptoms of low blood pressure like feeling dizzy or lightheaded, or if you develop extreme fatigue Side effects  that usually do not require medical attention (report to your doctor or health care professional if they continue or are bothersome):  cough This list may not describe all possible side effects. Call your doctor for medical advice about side effects. You may report side effects to FDA at 1-800-FDA-1088. Where should I keep my medicine? Keep out of the reach of children. Store at room temperature between 15 and 30 degrees C (59 and 86 degrees F). Throw away any unused medicine after the expiration date. NOTE: This sheet is a summary. It may not cover all possible information. If you have questions about this medicine, talk to your doctor, pharmacist, or health care provider.  2020 Elsevier/Gold Standard (2015-09-21 13:54:19)

## 2019-06-13 ENCOUNTER — Encounter: Payer: Self-pay | Admitting: Family

## 2019-06-13 ENCOUNTER — Telehealth: Payer: Self-pay | Admitting: Family

## 2019-06-13 LAB — BASIC METABOLIC PANEL
BUN/Creatinine Ratio: 13 (ref 9–23)
BUN: 29 mg/dL — ABNORMAL HIGH (ref 6–24)
CO2: 25 mmol/L (ref 20–29)
Calcium: 9.7 mg/dL (ref 8.7–10.2)
Chloride: 88 mmol/L — ABNORMAL LOW (ref 96–106)
Creatinine, Ser: 2.18 mg/dL — ABNORMAL HIGH (ref 0.57–1.00)
GFR calc Af Amer: 28 mL/min/{1.73_m2} — ABNORMAL LOW (ref >59)
GFR calc non Af Amer: 24 mL/min/{1.73_m2} — ABNORMAL LOW (ref >59)
Glucose: 119 mg/dL — ABNORMAL HIGH (ref 65–99)
Potassium: 6.2 mmol/L — ABNORMAL HIGH (ref 3.5–5.2)
Sodium: 128 mmol/L — ABNORMAL LOW (ref 134–144)

## 2019-06-13 LAB — PRO B NATRIURETIC PEPTIDE: NT-Pro BNP: 12215 pg/mL — ABNORMAL HIGH (ref 0–287)

## 2019-06-13 NOTE — Telephone Encounter (Signed)
Dr. Bettina Gavia spoke with her daughter Albina Billet this am. I spoke with Albina Billet to confirm understanding of medication changes. Also left a voicemail on Miss Shean's voicemail per [place] request. Albina Billet has given her mother the instructions regarding medicaiton changes and verbalized understanding.   1 week history of fatigue in setting of HFrEF (20-25%), CKD, PAF. She has been asked to STOP her Lasix, Spironolactone, Potassium. Asked to not start Entresto that was provided in office visit yesterday. Advised to stay off of Losartan which was discontinued during OV yesterday.   She will have repeat labs done at her PCP Dr. Truman Hayward on Tuesday.   Instructed to call after hours line with questions or present to the ED with acute concerns.   Loel Dubonnet, NP

## 2019-06-16 DIAGNOSIS — R7989 Other specified abnormal findings of blood chemistry: Secondary | ICD-10-CM | POA: Diagnosis not present

## 2019-06-16 DIAGNOSIS — I10 Essential (primary) hypertension: Secondary | ICD-10-CM | POA: Diagnosis not present

## 2019-06-16 DIAGNOSIS — E875 Hyperkalemia: Secondary | ICD-10-CM | POA: Diagnosis not present

## 2019-06-16 DIAGNOSIS — E663 Overweight: Secondary | ICD-10-CM | POA: Diagnosis not present

## 2019-06-16 DIAGNOSIS — R809 Proteinuria, unspecified: Secondary | ICD-10-CM | POA: Diagnosis not present

## 2019-06-16 DIAGNOSIS — J309 Allergic rhinitis, unspecified: Secondary | ICD-10-CM | POA: Diagnosis not present

## 2019-06-16 DIAGNOSIS — R609 Edema, unspecified: Secondary | ICD-10-CM | POA: Diagnosis not present

## 2019-06-16 DIAGNOSIS — I482 Chronic atrial fibrillation, unspecified: Secondary | ICD-10-CM | POA: Diagnosis not present

## 2019-06-16 DIAGNOSIS — M25552 Pain in left hip: Secondary | ICD-10-CM | POA: Diagnosis not present

## 2019-06-16 DIAGNOSIS — R0600 Dyspnea, unspecified: Secondary | ICD-10-CM | POA: Diagnosis not present

## 2019-06-23 ENCOUNTER — Other Ambulatory Visit: Payer: Medicare HMO

## 2019-06-28 ENCOUNTER — Inpatient Hospital Stay (HOSPITAL_COMMUNITY)
Admission: EM | Admit: 2019-06-28 | Discharge: 2019-06-30 | DRG: 378 | Disposition: A | Discharge status: Home / Self Care | Payer: Medicare HMO | Attending: Family Medicine | Admitting: Family Medicine

## 2019-06-28 ENCOUNTER — Other Ambulatory Visit: Payer: Self-pay

## 2019-06-28 ENCOUNTER — Emergency Department (HOSPITAL_COMMUNITY): Payer: Medicare HMO

## 2019-06-28 DIAGNOSIS — F410 Panic disorder [episodic paroxysmal anxiety] without agoraphobia: Secondary | ICD-10-CM | POA: Diagnosis present

## 2019-06-28 DIAGNOSIS — E876 Hypokalemia: Secondary | ICD-10-CM | POA: Diagnosis present

## 2019-06-28 DIAGNOSIS — I480 Paroxysmal atrial fibrillation: Secondary | ICD-10-CM | POA: Diagnosis present

## 2019-06-28 DIAGNOSIS — K922 Gastrointestinal hemorrhage, unspecified: Secondary | ICD-10-CM | POA: Diagnosis not present

## 2019-06-28 DIAGNOSIS — T39395A Adverse effect of other nonsteroidal anti-inflammatory drugs [NSAID], initial encounter: Secondary | ICD-10-CM | POA: Diagnosis present

## 2019-06-28 DIAGNOSIS — E0590 Thyrotoxicosis, unspecified without thyrotoxic crisis or storm: Secondary | ICD-10-CM | POA: Diagnosis present

## 2019-06-28 DIAGNOSIS — I130 Hypertensive heart and chronic kidney disease with heart failure and stage 1 through stage 4 chronic kidney disease, or unspecified chronic kidney disease: Secondary | ICD-10-CM | POA: Diagnosis present

## 2019-06-28 DIAGNOSIS — Z800 Family history of malignant neoplasm of digestive organs: Secondary | ICD-10-CM

## 2019-06-28 DIAGNOSIS — N182 Chronic kidney disease, stage 2 (mild): Secondary | ICD-10-CM | POA: Diagnosis present

## 2019-06-28 DIAGNOSIS — D649 Anemia, unspecified: Secondary | ICD-10-CM

## 2019-06-28 DIAGNOSIS — R748 Abnormal levels of other serum enzymes: Secondary | ICD-10-CM | POA: Diagnosis present

## 2019-06-28 DIAGNOSIS — I2510 Atherosclerotic heart disease of native coronary artery without angina pectoris: Secondary | ICD-10-CM | POA: Diagnosis present

## 2019-06-28 DIAGNOSIS — R531 Weakness: Secondary | ICD-10-CM | POA: Diagnosis not present

## 2019-06-28 DIAGNOSIS — N179 Acute kidney failure, unspecified: Secondary | ICD-10-CM | POA: Diagnosis present

## 2019-06-28 DIAGNOSIS — E860 Dehydration: Secondary | ICD-10-CM | POA: Diagnosis present

## 2019-06-28 DIAGNOSIS — Z8349 Family history of other endocrine, nutritional and metabolic diseases: Secondary | ICD-10-CM

## 2019-06-28 DIAGNOSIS — R197 Diarrhea, unspecified: Secondary | ICD-10-CM | POA: Diagnosis present

## 2019-06-28 DIAGNOSIS — R0602 Shortness of breath: Secondary | ICD-10-CM | POA: Diagnosis not present

## 2019-06-28 DIAGNOSIS — I959 Hypotension, unspecified: Secondary | ICD-10-CM | POA: Diagnosis not present

## 2019-06-28 DIAGNOSIS — Z79899 Other long term (current) drug therapy: Secondary | ICD-10-CM

## 2019-06-28 DIAGNOSIS — D62 Acute posthemorrhagic anemia: Secondary | ICD-10-CM | POA: Diagnosis present

## 2019-06-28 DIAGNOSIS — F329 Major depressive disorder, single episode, unspecified: Secondary | ICD-10-CM | POA: Diagnosis present

## 2019-06-28 DIAGNOSIS — Z85038 Personal history of other malignant neoplasm of large intestine: Secondary | ICD-10-CM

## 2019-06-28 DIAGNOSIS — I5022 Chronic systolic (congestive) heart failure: Secondary | ICD-10-CM | POA: Diagnosis present

## 2019-06-28 DIAGNOSIS — R7989 Other specified abnormal findings of blood chemistry: Secondary | ICD-10-CM | POA: Diagnosis present

## 2019-06-28 DIAGNOSIS — M1990 Unspecified osteoarthritis, unspecified site: Secondary | ICD-10-CM | POA: Diagnosis present

## 2019-06-28 DIAGNOSIS — K254 Chronic or unspecified gastric ulcer with hemorrhage: Principal | ICD-10-CM | POA: Diagnosis present

## 2019-06-28 DIAGNOSIS — Z87891 Personal history of nicotine dependence: Secondary | ICD-10-CM

## 2019-06-28 DIAGNOSIS — R609 Edema, unspecified: Secondary | ICD-10-CM | POA: Diagnosis not present

## 2019-06-28 DIAGNOSIS — Z8249 Family history of ischemic heart disease and other diseases of the circulatory system: Secondary | ICD-10-CM

## 2019-06-28 DIAGNOSIS — F411 Generalized anxiety disorder: Secondary | ICD-10-CM | POA: Diagnosis present

## 2019-06-28 DIAGNOSIS — K250 Acute gastric ulcer with hemorrhage: Secondary | ICD-10-CM

## 2019-06-28 DIAGNOSIS — T462X5A Adverse effect of other antidysrhythmic drugs, initial encounter: Secondary | ICD-10-CM | POA: Diagnosis present

## 2019-06-28 DIAGNOSIS — Z823 Family history of stroke: Secondary | ICD-10-CM

## 2019-06-28 DIAGNOSIS — Z20828 Contact with and (suspected) exposure to other viral communicable diseases: Secondary | ICD-10-CM | POA: Diagnosis present

## 2019-06-28 DIAGNOSIS — Z7901 Long term (current) use of anticoagulants: Secondary | ICD-10-CM

## 2019-06-28 DIAGNOSIS — D500 Iron deficiency anemia secondary to blood loss (chronic): Secondary | ICD-10-CM | POA: Diagnosis present

## 2019-06-28 HISTORY — DX: Anemia, unspecified: D64.9

## 2019-06-28 LAB — COMPREHENSIVE METABOLIC PANEL
ALT: 137 U/L — ABNORMAL HIGH (ref 0–44)
AST: 130 U/L — ABNORMAL HIGH (ref 15–41)
Albumin: 3 g/dL — ABNORMAL LOW (ref 3.5–5.0)
Alkaline Phosphatase: 106 U/L (ref 38–126)
Anion gap: 15 (ref 5–15)
BUN: 40 mg/dL — ABNORMAL HIGH (ref 6–20)
CO2: 22 mmol/L (ref 22–32)
Calcium: 8.5 mg/dL — ABNORMAL LOW (ref 8.9–10.3)
Chloride: 93 mmol/L — ABNORMAL LOW (ref 98–111)
Creatinine, Ser: 2.15 mg/dL — ABNORMAL HIGH (ref 0.44–1.00)
GFR calc Af Amer: 29 mL/min — ABNORMAL LOW (ref >60)
GFR calc non Af Amer: 25 mL/min — ABNORMAL LOW (ref >60)
Glucose, Bld: 60 mg/dL — ABNORMAL LOW (ref 70–99)
Potassium: 3.3 mmol/L — ABNORMAL LOW (ref 3.5–5.1)
Sodium: 130 mmol/L — ABNORMAL LOW (ref 135–145)
Total Bilirubin: 0.6 mg/dL (ref 0.3–1.2)
Total Protein: 5.2 g/dL — ABNORMAL LOW (ref 6.5–8.1)

## 2019-06-28 LAB — LIPASE, BLOOD: Lipase: 23 U/L (ref 11–51)

## 2019-06-28 LAB — IRON AND TIBC
Iron: 39 ug/dL (ref 28–170)
Saturation Ratios: 10 % — ABNORMAL LOW (ref 10.4–31.8)
TIBC: 385 ug/dL (ref 250–450)
UIBC: 346 ug/dL

## 2019-06-28 LAB — PREPARE RBC (CROSSMATCH)

## 2019-06-28 LAB — CBC WITH DIFFERENTIAL/PLATELET
Abs Immature Granulocytes: 0 10*3/uL (ref 0.00–0.07)
Basophils Absolute: 0 10*3/uL (ref 0.0–0.1)
Basophils Relative: 0 %
Eosinophils Absolute: 0.1 10*3/uL (ref 0.0–0.5)
Eosinophils Relative: 1 %
HCT: 21 % — ABNORMAL LOW (ref 36.0–46.0)
Hemoglobin: 6.5 g/dL — CL (ref 12.0–15.0)
Lymphocytes Relative: 12 %
Lymphs Abs: 1.5 10*3/uL (ref 0.7–4.0)
MCH: 28 pg (ref 26.0–34.0)
MCHC: 31 g/dL (ref 30.0–36.0)
MCV: 90.5 fL (ref 80.0–100.0)
Monocytes Absolute: 0.3 10*3/uL (ref 0.1–1.0)
Monocytes Relative: 2 %
Neutro Abs: 11 10*3/uL — ABNORMAL HIGH (ref 1.7–7.7)
Neutrophils Relative %: 85 %
Platelets: 410 10*3/uL — ABNORMAL HIGH (ref 150–400)
RBC: 2.32 MIL/uL — ABNORMAL LOW (ref 3.87–5.11)
RDW: 22.6 % — ABNORMAL HIGH (ref 11.5–15.5)
WBC: 12.8 10*3/uL — ABNORMAL HIGH (ref 4.0–10.5)
nRBC: 0 % (ref 0.0–0.2)
nRBC: 0 /100 WBC

## 2019-06-28 LAB — RETICULOCYTES
Immature Retic Fract: 20.9 % — ABNORMAL HIGH (ref 2.3–15.9)
RBC.: 2.17 MIL/uL — ABNORMAL LOW (ref 3.87–5.11)
Retic Count, Absolute: 114.6 10*3/uL (ref 19.0–186.0)
Retic Ct Pct: 5.3 % — ABNORMAL HIGH (ref 0.4–3.1)

## 2019-06-28 LAB — VITAMIN B12: Vitamin B-12: 2046 pg/mL — ABNORMAL HIGH (ref 180–914)

## 2019-06-28 LAB — CREATININE, URINE, RANDOM: Creatinine, Urine: 25.49 mg/dL

## 2019-06-28 LAB — FOLATE: Folate: 9.5 ng/mL (ref >5.9)

## 2019-06-28 LAB — FERRITIN: Ferritin: 191 ng/mL (ref 11–307)

## 2019-06-28 LAB — MAGNESIUM: Magnesium: 2.1 mg/dL (ref 1.7–2.4)

## 2019-06-28 LAB — POC OCCULT BLOOD, ED: Fecal Occult Bld: POSITIVE — AB

## 2019-06-28 LAB — BRAIN NATRIURETIC PEPTIDE: B Natriuretic Peptide: 168.7 pg/mL — ABNORMAL HIGH (ref 0.0–100.0)

## 2019-06-28 IMAGING — DX DG CHEST 1V PORT
1 series · 1 of 1 positions shown · non-contrast
Comparison: None.

CLINICAL DATA: Shortness of breath, CHF

EXAM:
PORTABLE CHEST 1 VIEW

[chest ap]
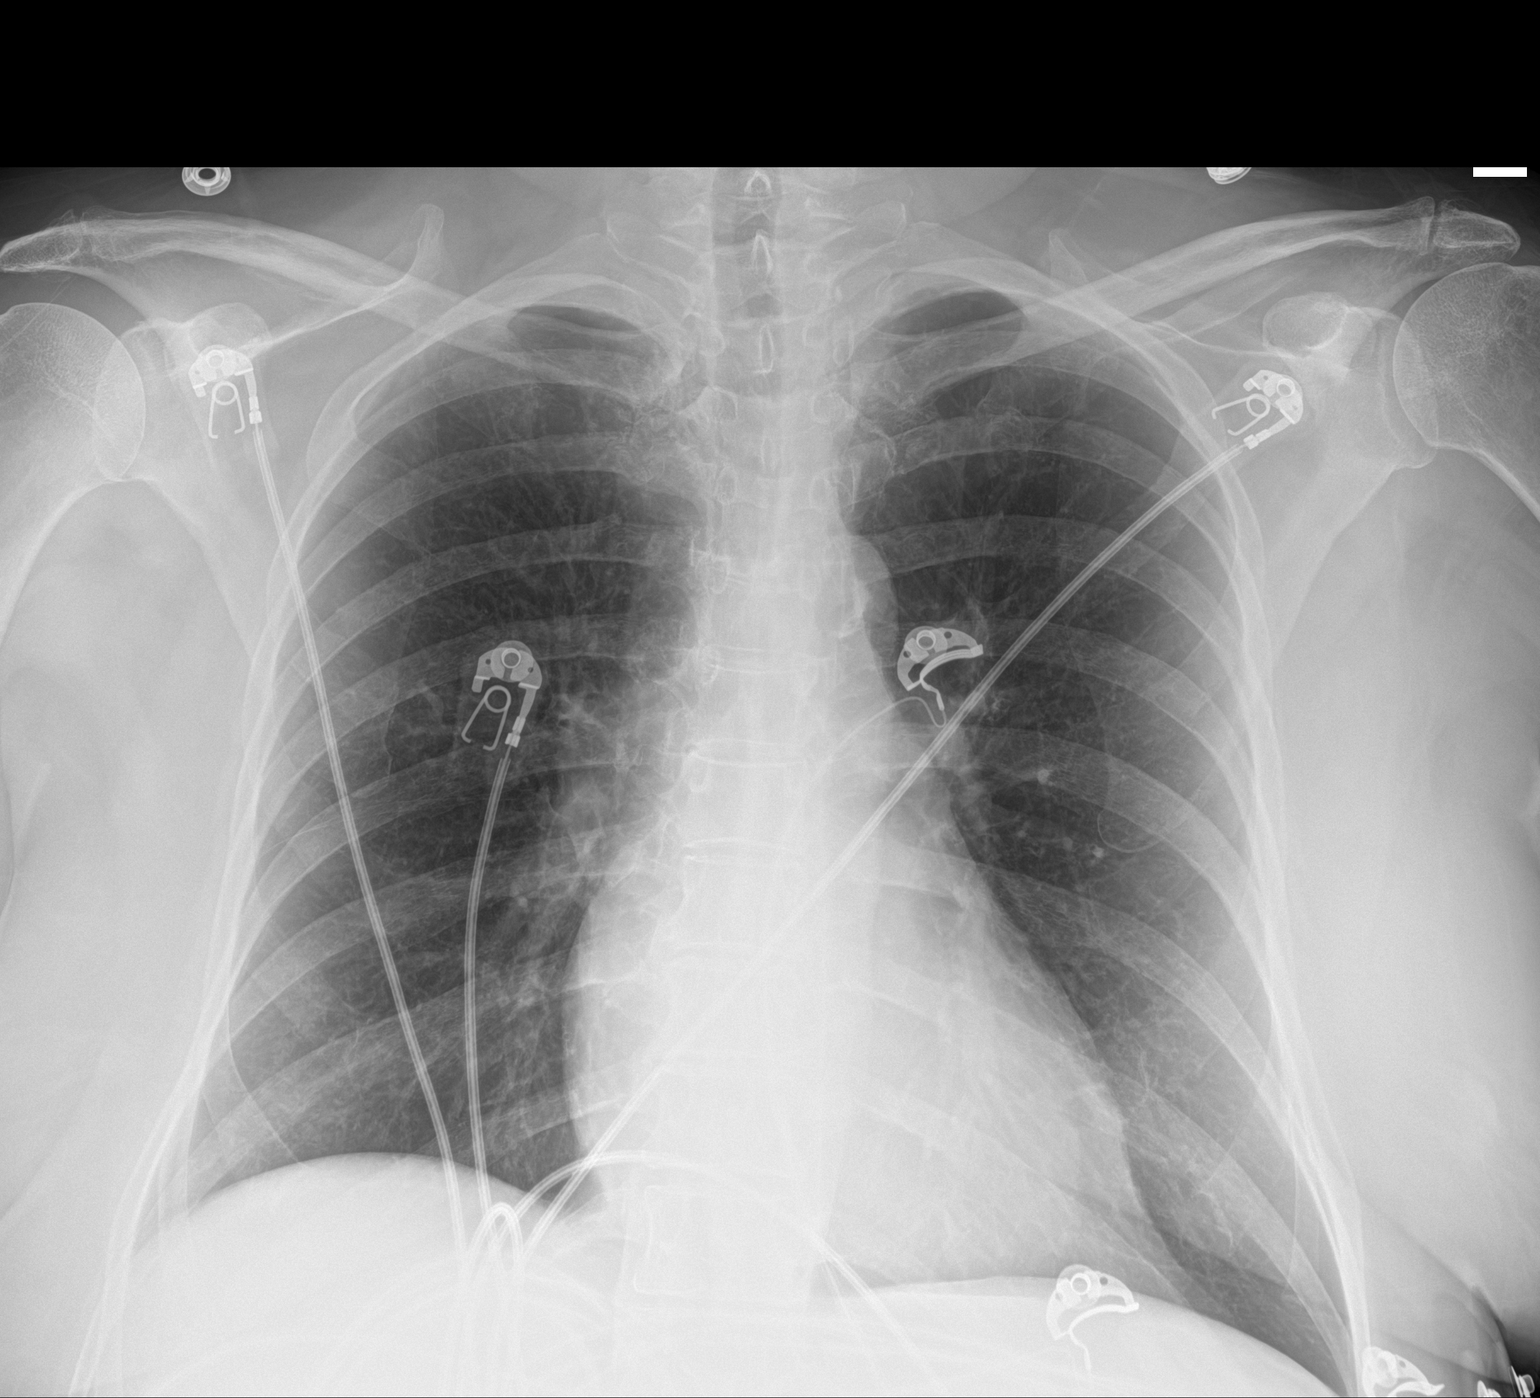

[1 of 1 positions shown; findings below may reference images not displayed]

FINDINGS: Mild cardiomegaly. Both lungs are clear. The visualized skeletal
structures are unremarkable.
IMPRESSION: Mild cardiomegaly without acute abnormality of the lungs in AP
portable projection.

## 2019-06-28 MED ORDER — METHIMAZOLE 5 MG PO TABS
5.0000 mg | ORAL_TABLET | Freq: Every day | ORAL | Status: DC
  Administered 2019-06-29: 5 mg via ORAL
  Filled 2019-06-28 (×3): qty 1

## 2019-06-28 MED ORDER — PANTOPRAZOLE SODIUM 40 MG IV SOLR
40.0000 mg | Freq: Once | INTRAVENOUS | Status: AC
  Administered 2019-06-28: 40 mg via INTRAVENOUS
  Filled 2019-06-28: qty 40

## 2019-06-28 MED ORDER — SODIUM CHLORIDE 0.9 % IV SOLN
10.0000 mL/h | Freq: Once | INTRAVENOUS | Status: AC
  Administered 2019-06-28: 10 mL/h via INTRAVENOUS

## 2019-06-28 MED ORDER — METOPROLOL SUCCINATE ER 25 MG PO TB24
50.0000 mg | ORAL_TABLET | Freq: Two times a day (BID) | ORAL | Status: DC
  Filled 2019-06-28: qty 2

## 2019-06-28 MED ORDER — SODIUM CHLORIDE 0.9 % IV BOLUS
500.0000 mL | Freq: Once | INTRAVENOUS | Status: AC
  Administered 2019-06-28: 500 mL via INTRAVENOUS

## 2019-06-28 MED ORDER — PANTOPRAZOLE SODIUM 40 MG IV SOLR
40.0000 mg | Freq: Two times a day (BID) | INTRAVENOUS | Status: DC
  Administered 2019-06-28 – 2019-06-30 (×4): 40 mg via INTRAVENOUS
  Filled 2019-06-28 (×4): qty 40

## 2019-06-28 MED ORDER — ACETAMINOPHEN 325 MG PO TABS
650.0000 mg | ORAL_TABLET | Freq: Four times a day (QID) | ORAL | Status: DC | PRN
  Administered 2019-06-29 – 2019-06-30 (×2): 650 mg via ORAL
  Filled 2019-06-28 (×2): qty 2

## 2019-06-28 MED ORDER — POTASSIUM CHLORIDE CRYS ER 20 MEQ PO TBCR
20.0000 meq | EXTENDED_RELEASE_TABLET | Freq: Once | ORAL | Status: AC
  Administered 2019-06-28: 20 meq via ORAL
  Filled 2019-06-28: qty 1

## 2019-06-28 MED ORDER — SODIUM CHLORIDE 0.9 % IV BOLUS: 500.0000 mL | Freq: Once | INTRAVENOUS | Status: DC

## 2019-06-28 MED ORDER — AMIODARONE HCL 200 MG PO TABS
200.0000 mg | ORAL_TABLET | Freq: Two times a day (BID) | ORAL | Status: DC
  Administered 2019-06-29 – 2019-06-30 (×3): 200 mg via ORAL
  Filled 2019-06-28 (×4): qty 1

## 2019-06-28 MED ORDER — ALBUTEROL SULFATE HFA 108 (90 BASE) MCG/ACT IN AERS
2.0000 | INHALATION_SPRAY | RESPIRATORY_TRACT | Status: DC | PRN
  Filled 2019-06-28: qty 6.7

## 2019-06-28 MED ORDER — ALPRAZOLAM 0.5 MG PO TABS
0.5000 mg | ORAL_TABLET | Freq: Two times a day (BID) | ORAL | Status: DC | PRN
  Administered 2019-06-29 – 2019-06-30 (×2): 0.5 mg via ORAL
  Filled 2019-06-28 (×2): qty 1

## 2019-06-28 MED ORDER — TRAMADOL HCL 50 MG PO TABS
50.0000 mg | ORAL_TABLET | Freq: Two times a day (BID) | ORAL | Status: DC | PRN
  Administered 2019-06-30: 50 mg via ORAL
  Filled 2019-06-28: qty 1

## 2019-06-28 MED ORDER — ACETAMINOPHEN 650 MG RE SUPP: 650.0000 mg | Freq: Four times a day (QID) | RECTAL | Status: DC | PRN

## 2019-06-28 NOTE — ED Provider Notes (Signed)
[facility] Provider Note   CSN: [id number] Arrival date & time: 06/28/19  1710     History   Chief Complaint Chief Complaint  Patient presents with  . Hypotension    HPI [facility] is a 58 y.o. female.     HPI  58 year old female presents with weakness and diarrhea.  Has been ongoing about 3 days.  She mostly has the diarrhea at night.  Once had some blood in it but otherwise no blood.  Has some upper abdominal pain after eating but no abdominal pain now.  No fever but some chills.  No vomiting.  Has chronic shortness of breath with exertion.  Legs seem to be more swollen than typical recently (past week or so).  Past Medical History:  Diagnosis Date  . Anxiety and depression   . Atrial flutter ([place])   . Chronic systolic CHF (congestive heart failure) (HCC)    a. dx in setting of atrial fib/flutter - possibly tachy mediated. Coronary CTA with only mild CAD in 04/2019.  Marland Kitchen CKD (chronic kidney disease), stage II   . Confusion    a. persistent confusion during 04/2019 admission of unclear cause. Home meds adjusted. CT/MRI brain nonacute.  . Diabetes ([place])   . Edema   . Elevated liver function tests   . Hypercholesteremia   . Hyperkalemia   . Hypertension   . Hypertensive heart and chronic kidney disease with systolic congestive heart failure ([place])   . Hyperthyroidism   . Hypokalemia   . Hypokalemia   . Hypomagnesemia   . Hyponatremia   . Iron deficiency anemia   . Mild CAD    a. Coronary CT 04/2019 - Minimal, Non-obstructive CAD.  Marland Kitchen NICM (nonischemic cardiomyopathy) ([name])   . Persistent atrial fibrillation ([place])   . Prolonged QT interval     Patient Active Problem List   Diagnosis Date Noted  . Symptomatic anemia 06/28/2019  . Elevated liver function tests   . NICM (nonischemic cardiomyopathy) ([place])   . Mild CAD   . Confusion   . Diabetes ([place])   . Iron deficiency anemia   . Hyperthyroidism   . Altered mental status  05/07/2019  . Acute systolic heart failure ([place]) 05/07/2019  . AKI (acute kidney injury) ([place]) 05/07/2019  . Hyponatremia   . CAD (coronary artery disease)   . Essential hypertension   . Hypokalemia   . Prolonged Q-T interval on ECG 04/30/2019  . Atrial fibrillation with rapid ventricular response ([place]) 04/29/2019  . Persistent atrial fibrillation ([place])   . Hyperlipidemia 04/23/2019  . Anemia 04/23/2019  . CKD (chronic kidney disease) stage 3, GFR 30-59 ml/min 04/23/2019  . Depression 03/02/2019  . Osteoarthritis 03/02/2019  . Type 2 diabetes mellitus with complication, with long-term current use of insulin ([place]) 03/02/2019  . Hypertensive heart disease 03/02/2019    Past Surgical History:  Procedure Laterality Date  . CARDIOVERSION N/A 04/29/2019   Procedure: CARDIOVERSION;  Surgeon: Sanda Klein, MD;  Location: [facility] ENDOSCOPY;  Service: Cardiovascular;  Laterality: N/A;  . CARDIOVERSION N/A 05/04/2019   Procedure: CARDIOVERSION;  Surgeon: Josue Hector, MD;  Location: [facility] ENDOSCOPY;  Service: Cardiovascular;  Laterality: N/A;  . TEE WITHOUT CARDIOVERSION  04/29/2019  . TEE WITHOUT CARDIOVERSION N/A 04/29/2019   Procedure: TRANSESOPHAGEAL ECHOCARDIOGRAM (TEE);  Surgeon: Sanda Klein, MD;  Location: [facility] ENDOSCOPY;  Service: Cardiovascular;  Laterality: N/A;  . TUBAL LIGATION       OB History   No obstetric history on  file.      Home Medications    Prior to Admission medications   Medication Sig Start Date End Date Taking? Authorizing Provider  albuterol (VENTOLIN HFA) 108 (90 Base) MCG/ACT inhaler Inhale 2 puffs into the lungs every 4 (four) hours as needed for wheezing or shortness of breath.  06/06/19  Yes [provider]  ALPRAZolam Duanne Moron) 0.5 MG tablet Take 0.5 mg by mouth See admin instructions. Take one tablet (0.5 mg) by mouth twice daily - mid-day and bedtime 02/14/19  Yes [provider]  amiodarone (PACERONE) 200 MG tablet Take 200 mg by mouth 2  (two) times daily.   Yes [provider]  Ascorbic Acid (VITAMIN C) 1000 MG tablet Take 1,000 mg by mouth daily.   Yes [provider]  cetirizine (ZYRTEC) 10 MG tablet Take 10 mg by mouth daily as needed for allergies.    Yes [provider]  cholecalciferol (VITAMIN D3) 25 MCG (1000 UT) tablet Take 1,000 Units by mouth daily.   Yes [provider]  DULoxetine (CYMBALTA) 60 MG capsule Take 60 mg by mouth 2 (two) times daily. 02/26/19  Yes [provider]  fenofibrate (TRICOR) 48 MG tablet Take 48 mg by mouth at bedtime. 02/26/19  Yes [provider]  furosemide (LASIX) 40 MG tablet Take 1 tablet (40 mg total) by mouth 2 (two) times daily. Patient taking differently: Take 40 mg by mouth daily.  04/23/19  Yes Richardo Priest, MD  ibuprofen (ADVIL) 200 MG tablet Take 200-400 mg by mouth 2 (two) times daily as needed for headache (pain).   Yes [provider]  Menthol, Topical Analgesic, (ICY HOT ADVANCED RELIEF EX) Apply 1 application topically 3 (three) times daily as needed (pain).    Yes [provider]  methimazole (TAPAZOLE) 5 MG tablet Take 5 mg by mouth at bedtime.  02/21/19  Yes [provider]  metoprolol succinate (TOPROL-XL) 100 MG 24 hr tablet Take 50 mg by mouth 2 (two) times daily.  06/09/19  Yes [provider]  rivaroxaban (XARELTO) 20 MG TABS tablet Take 1 tablet (20 mg total) by mouth daily with supper. Patient taking differently: Take 20 mg by mouth daily after supper.  05/07/19  Yes Furth, Cadence H, PA-C  traMADol (ULTRAM) 50 MG tablet Take 50 mg by mouth 4 (four) times daily as needed (pain).  06/09/19  Yes [provider]  doxycycline (VIBRA-TABS) 100 MG tablet Take 100 mg by mouth 2 (two) times daily. 06/12/19   [provider]  potassium chloride SA (KLOR-CON) 20 MEQ tablet Take 20 mEq by mouth daily.    [provider]  sacubitril-valsartan (ENTRESTO) 24-26 MG Take 1  tablet by mouth 2 (two) times daily. 06/12/19   Richardo Priest, MD  spironolactone (ALDACTONE) 25 MG tablet Take 0.5 tablets (12.5 mg total) by mouth daily. 05/08/19   Furth, Cadence H, PA-C    Family History Family History  Problem Relation Age of Onset  . Cancer Mother   . Heart disease Mother   . Hypercholesterolemia Mother   . Osteoarthritis Mother   . Stroke Mother   . Heart disease Father   . Hypercholesterolemia Brother     Social History Social History   Tobacco Use  . Smoking status: Former Smoker    Packs/day: 2.00    Years: 30.00    Pack years: 60.00    Types: Cigarettes    Quit date: 2001    Years since quitting: 19.8  .  Smokeless tobacco: Never Used  Substance Use Topics  . Alcohol use: Never    Frequency: Never  . Drug use: Never     Allergies   Ambien [zolpidem tartrate]   Review of Systems Review of Systems  Constitutional: Positive for chills. Negative for fever.  Respiratory: Positive for shortness of breath.   Cardiovascular: Positive for leg swelling. Negative for chest pain.  Gastrointestinal: Positive for abdominal pain and diarrhea. Negative for vomiting.  Neurological: Positive for weakness.  All other systems reviewed and are negative.    Physical Exam Updated Vital Signs BP (!) 84/37   Pulse 66   Temp 98.5 F (36.9 C) (Oral)   Resp 18   LMP  (LMP Unknown) Comment: ?10 years ago?  SpO2 100%   Physical Exam Vitals signs and nursing note reviewed. Exam conducted with a chaperone present.  Constitutional:      General: She is not in acute distress.    Appearance: She is well-developed. She is not ill-appearing or diaphoretic.  HENT:     Head: Normocephalic and atraumatic.     Right Ear: External ear normal.     Left Ear: External ear normal.     Nose: Nose normal.  Eyes:     General:        Right eye: No discharge.        Left eye: No discharge.  Cardiovascular:     Rate and Rhythm: Normal rate and regular rhythm.      Heart sounds: Normal heart sounds.  Pulmonary:     Effort: Pulmonary effort is normal.     Breath sounds: Normal breath sounds.  Abdominal:     General: There is no distension.     Palpations: Abdomen is soft.     Tenderness: There is no abdominal tenderness.  Genitourinary:    Comments: Black stool on DRE. No masses/tenderness or hemorrhoids Musculoskeletal:     Right lower leg: Edema present.     Left lower leg: Edema present.  Skin:    General: Skin is warm and dry.  Neurological:     Mental Status: She is alert.  Psychiatric:        Mood and Affect: Mood is not anxious.      ED Treatments / Results  Labs (all labs ordered are listed, but only abnormal results are displayed) Labs Reviewed  COMPREHENSIVE METABOLIC PANEL - Abnormal; Notable for the following components:      Result Value   Sodium 130 (*)    Potassium 3.3 (*)    Chloride 93 (*)    Glucose, Bld 60 (*)    BUN 40 (*)    Creatinine, Ser 2.15 (*)    Calcium 8.5 (*)    Total Protein 5.2 (*)    Albumin 3.0 (*)    AST 130 (*)    ALT 137 (*)    GFR calc non Af Amer 25 (*)    GFR calc Af Amer 29 (*)    All other components within normal limits  CBC WITH DIFFERENTIAL/PLATELET - Abnormal; Notable for the following components:   WBC 12.8 (*)    RBC 2.32 (*)    Hemoglobin 6.5 (*)    HCT 21.0 (*)    RDW 22.6 (*)    Platelets 410 (*)    Neutro Abs 11.0 (*)    All other components within normal limits  BRAIN NATRIURETIC PEPTIDE - Abnormal; Notable for the following components:   B Natriuretic Peptide 168.7 (*)  All other components within normal limits  IRON AND TIBC - Abnormal; Notable for the following components:   Saturation Ratios 10 (*)    All other components within normal limits  RETICULOCYTES - Abnormal; Notable for the following components:   Retic Ct Pct 5.3 (*)    RBC. 2.17 (*)    Immature Retic Fract 20.9 (*)    All other components within normal limits  POC OCCULT BLOOD, ED - Abnormal;  Notable for the following components:   Fecal Occult Bld POSITIVE (*)    All other components within normal limits  SARS CORONAVIRUS 2 (TAT 6-24 HRS)  LIPASE, BLOOD  MAGNESIUM  FERRITIN  FOLATE  CREATININE, URINE, RANDOM  COMPREHENSIVE METABOLIC PANEL  CBC  UREA NITROGEN, URINE  VITAMIN B12  TYPE AND SCREEN  PREPARE RBC (CROSSMATCH)  ABO/RH    EKG EKG Interpretation  Date/Time:  Sunday June 28 2019 18:50:09 EST Ventricular Rate:  60 PR Interval:    QRS Duration: 108 QT Interval:  496 QTC Calculation: 496 R Axis:   59 Text Interpretation: Sinus rhythm Short PR interval Nonspecific T abnormalities, lateral leads Borderline prolonged QT interval Confirmed by Sherwood Gambler [phone]) on 06/28/2019 7:15:12 PM   Radiology Dg Chest Portable 1 View  Result Date: 06/28/2019 CLINICAL DATA:  Shortness of breath, CHF EXAM: PORTABLE CHEST 1 VIEW COMPARISON:  None. FINDINGS: Mild cardiomegaly. Both lungs are clear. The visualized skeletal structures are unremarkable. IMPRESSION: Mild cardiomegaly without acute abnormality of the lungs in AP portable projection. Electronically Signed   By: Eddie Candle M.D.   On: 06/28/2019 19:14    Procedures .Critical Care Performed by: Sherwood Gambler, MD Authorized by: Sherwood Gambler, MD   Critical care provider statement:    Critical care time (minutes):  35   Critical care time was exclusive of:  Separately billable procedures and treating other patients   Critical care was necessary to treat or prevent imminent or life-threatening deterioration of the following conditions:  Shock and circulatory failure   Critical care was time spent personally by me on the following activities:  Discussions with consultants, evaluation of patient's response to treatment, examination of patient, ordering and performing treatments and interventions, ordering and review of laboratory studies, ordering and review of radiographic studies, pulse oximetry,  re-evaluation of patient's condition, obtaining history from patient or surrogate and review of old charts   (including critical care time)  Angiocath insertion Performed by: Ephraim Hamburger  Consent: Verbal consent obtained. Risks and benefits: risks, benefits and alternatives were discussed Time out: Immediately prior to procedure a "time out" was called to verify the correct patient, procedure, equipment, support staff and site/side marked as required.  Preparation: Patient was prepped and draped in the usual sterile fashion.  Vein Location: left basilic  Ultrasound Guided  Gauge: 20  Normal blood return and flush without difficulty Patient tolerance: Patient tolerated the procedure well with no immediate complications.    Medications Ordered in ED Medications  traMADol (ULTRAM) tablet 50 mg (has no administration in time range)  amiodarone (PACERONE) tablet 200 mg (0 mg Oral Hold 06/28/19 2249)  metoprolol succinate (TOPROL-XL) 24 hr tablet 50 mg (50 mg Oral Not Given 06/28/19 2246)  ALPRAZolam (XANAX) tablet 0.5 mg (has no administration in time range)  methimazole (TAPAZOLE) tablet 5 mg (has no administration in time range)  albuterol (VENTOLIN HFA) 108 (90 Base) MCG/ACT inhaler 2 puff (has no administration in time range)  acetaminophen (TYLENOL) tablet 650 mg (has no administration  in time range)    Or  acetaminophen (TYLENOL) suppository 650 mg (has no administration in time range)  pantoprazole (PROTONIX) injection 40 mg (40 mg Intravenous Given 06/28/19 2251)  sodium chloride 0.9 % bolus 500 mL (0 mLs Intravenous Stopped 06/28/19 1947)  potassium chloride SA (KLOR-CON) CR tablet 20 mEq (20 mEq Oral Given 06/28/19 2017)  pantoprazole (PROTONIX) injection 40 mg (40 mg Intravenous Given 06/28/19 2020)  0.9 %  sodium chloride infusion (10 mL/hr Intravenous New Bag/Given 06/28/19 2243)     Initial Impression / Assessment and Plan / ED Course  I have reviewed the triage  vital signs and the nursing notes.  Pertinent labs & imaging results that were available during my care of the patient were reviewed by me and considered in my medical decision making (see chart for details).        Patient is found to have symptomatic anemia with a hemoglobin of 6.5.  Pharmacy tech reports that the patient has been using ibuprofen recently and she is also on Xarelto.  Likely is having gastritis and perhaps an ulcer.  Rectal exam concerning for melena.  She has soft blood pressures though is mentating well, should come up with blood.  Otherwise, her abdominal exam is benign I do not think immediate CT imaging would be helpful.  Admit to family practice.  Final Clinical Impressions(s) / ED Diagnoses   Final diagnoses:  Symptomatic anemia  GI bleed due to NSAIDs    ED Discharge Orders    None       Sherwood Gambler, MD 06/28/19 2321

## 2019-06-28 NOTE — H&P (Addendum)
[facility] Admission History and Physical Service Pager: [phone]  Patient name: Brianna Porter Medical record number: [id number] Date of birth: [birth year] Age: 58 y.o. Gender: female  Primary Care Provider: Cher Nakai, MD Consultants: GI Code Status: FULL  Preferred Emergency Contact: Daughter, Albina Billet [phone]  Chief Complaint: diarrhea and generalized weakness  Assessment and Plan: Brianna Porter is a 58 y.o. female presenting with diarrhea and generalized weakness. PMH is significant for HFrEF with EF 20-25%, paroxysmal atrial fibrillation on amiodarone and Xarelto, hyperthyroidism, osteoarthritis, and anxiety.  Symptomatic anemia due to GI bleed likely secondary to NSAID use Patient reports diarrhea for 3 days.  Daughter noted stool black in color.  Patient reports taking ibuprofen 400 mg twice daily for a long period of time.  Patient also takes Xarelto 20 mg daily.  Denies any chest pain, hematemesis, or hematuria but does endorse shortness of breath with activity and fatigue over the past week.   Patient reports last colonoscopy 2 years ago was normal, although no records can be found in the chart.  Labs significant for hemoglobin 6.5, FOBT positive with black stool noted on DRE.  Anemia panel with slightly reduced saturation ratio, normal ferritin, and increased reticulocyte count, consistent with acute blood loss anemia.  Afebrile but low-normal blood pressures ranging from 74/65 to 113/46, likely due to GI losses and anemia.  Oxygen saturations 100% on room air.  On exam patient looks pale.  No increased work of breathing. Multiple bruising noted to arms and legs.  Given dark stool and NSAID use, patient likely has peptic ulcer or gastritis causing a GI bleed that has been worsened by Xarelto.  Of note, patient was recently switched from Eliquis to Xarelto due to pruritis with Eliquis, and Xarelto is associated with a great incidence of GI  bleeding. -Admit to cardiac telemetry, attending Dr. Owens Shark -Hold Xarelto -NO NSAIDs, patient counseled -Transfuse 1 unit PRBCs, H&H post-transfusion -H&H in a.m. -Consult GI in a.m. -NPO at midnight for possible EGD  -PPI IV BID  Elevated liver enzymes AST 130, ALT 137.  Previously ALT 63, AST 41.  Patient has no complaints of right upper quadrant pain.  Possible causes of elevated liver enzymes include ibuprofen, Xarelto.  Also considered hepatitis as etiology as well as mild liver shock due to low blood pressures and acute blood loss. -Hepatitis B&C panel -NO NSAIDs -Continue to monitor  AKI likely secondary to combination of dehydration and NSAID use Creatinine 2.15.  Baseline 0.77-0.92(04/2019) Patient reports having diarrhea for 3 days. She also takes Lasix 40 mg daily which could be a contributing factor to dehydration and elevated Cr.  Patient also reports long term use of Ibuprofen 400 mg twice daily which could also be a contributing to rise in Cr.  Elevated creatinine and hyperkalemia to 6.2 was also noted at patient's cardiology visit two weeks ago, and she was advised to stop Entresto, Lasix, spironolactone, and potassium supplementation at that time.  She mistakenly continued the Lasix but stopped the other medications. -avoid nephrotoxic agents -oral hydration until midnight -consider renal ultrasound in am -BMP in a.m. -FEurea  -replenish fluids in order to maintain MAP>60  HFrEF TEE 04/2019 LVEF 20-25%, left ventricle with severely reduced systolic function and global hypokinesis without regional wall motion abnormalities.  Right ventricle also has severely reduced systolic function.  EF could have been temporarily low due to patient's atrial fibrillation and tachycardia at that time, and repeat echo is planned in 3-6 months according to cardiology  notes.  Patient sees Dr. Shirlee More, cardiologist.  Patient reports dry weight 130 pounds.  On exam lung sounds clear to  auscultation.  No JVD appreciated.  Bilateral lower extremity 3+ edema  up to knee, which could also be due to NSAID use.  No pulmonary edema or vascular congestion visualized on portable CXR. -weigh daily -strict intake and output -Consult cardiology in a.m. to ask if repeat echo would be helpful -Hold diureses in light of elevated creatinine. -CMP in a.m.  Hypokalemia  Potassium 3.3 and repleted in the ED.  During her recent visit at our office potassium 6.2.  Home medication Entresto, spironolactone and potassium chloride tabs was stopped at that time.  Patient also taking Lasix 40 mg twice daily. -Replete electrolytes as needed -BMP in a.m. -hold Lasix d/t dehydration, AKI, low BP  Paroxysmal A. Fib with H/O Cardioversion EKG shows sinus rhythm.  Home medication Xarelto 20 mg daily, metoprolol 50 mg twice daily and Amiodarone 200 mg twice daily.  Patient was taking Eliquis but was recently switched to Xarelto due to itching. -Hold Xarelto for acute GI bleed. -Continue amiodarone 200 mg twice daily, hold if MAP<60 -Continue metoprolol 50 mg twice daily, hold if MAP<60 -Home Toprol XL is being dosed twice daily, consider making once daily dosing at discharge  Hyperthyroidism Asymptomatic.  Home medication methimazole 5 mg nightly -Continue methimazole at night  Osteoarthritis Patient has been taking tylenol, ibuprofen 400 mg daily long term, and tramadol 50 mg four times daily PRN. -continue tylenol 650 mg Q6H PRN and tramadol 50 mg Q12H PRN for pain      Anxiety Patient reports panic attacks and generalized anxiety and takes Xanax 0.5 mg BID as well as cymbalta 60 mg BID at home. -continue Xanax -hold cymbalta due to AKI  FEN/GI:  NPO  Prophylaxis:  SCD  Disposition: Admit to Med Surg, Attending Dr. Owens Shark  History of Present Illness:  Brianna Porter is a 58 y.o. female presenting with weakness and diarrhea for the past three days.  She says that her diarrhea occurred  mostly at night and denies any food triggers.  She says that her stool appeared black with some bright red blood as well.  She describes her stools as loose and occurred about three times per night.  She tried to take Immodium without relief.  She denies sick contacts.  She reports dizziness and presyncope with activity but denies loss of consciousness.  She denies fever but does report chills.  She reports shortness of breath with activity.  Her last colonoscopy was about two years and says that she was told it was normal.  She says her family has a history of colon cancer.  She says that her weight has been fluctuating, and her dry weight is 130 lbs.  She notes that her last weight was 134 lbs and she has had swelling of her legs over the past week.  Her Entresto, Spironolactone, and Potassium   In the ED she was afebrile, with soft blood pressures.  She was given N/S 500 cc bolus, Protonix 40 mg IV x1 and potassium chloride 20 mEq x 1.  Labs significant for potassium 3.3, chloride 93, glucose 60, BUN 40 creatinine 2.15.  AST 130, ALT 137.  Hemoglobin 6.5, WBCs 12.8.  She was ordered 1 unit PRBCs. Review Of Systems: Per HPI with the following additions:   Review of Systems  Constitutional: Positive for chills and malaise/fatigue. Negative for fever.  HENT: Negative for sore throat.  Respiratory: Positive for shortness of breath. Negative for cough.   Cardiovascular: Positive for leg swelling. Negative for chest pain, orthopnea and PND.  Gastrointestinal: Positive for abdominal pain, blood in stool, diarrhea and melena. Negative for nausea and vomiting.  Genitourinary: Negative for dysuria.  Psychiatric/Behavioral: The patient is nervous/anxious.     Patient Active Problem List   Diagnosis Date Noted  . Symptomatic anemia 06/28/2019  . Elevated liver function tests   . NICM (nonischemic cardiomyopathy) ([place])   . Mild CAD   . Confusion   . Diabetes ([place])   . Iron deficiency anemia   .  Hyperthyroidism   . Altered mental status 05/07/2019  . Acute systolic heart failure ([place]) 05/07/2019  . AKI (acute kidney injury) ([place]) 05/07/2019  . Hyponatremia   . CAD (coronary artery disease)   . Essential hypertension   . Hypokalemia   . Prolonged Q-T interval on ECG 04/30/2019  . Atrial fibrillation with rapid ventricular response ([place]) 04/29/2019  . Persistent atrial fibrillation ([place])   . Hyperlipidemia 04/23/2019  . Anemia 04/23/2019  . CKD (chronic kidney disease) stage 3, GFR 30-59 ml/min 04/23/2019  . Depression 03/02/2019  . Osteoarthritis 03/02/2019  . Type 2 diabetes mellitus with complication, with long-term current use of insulin ([place]) 03/02/2019  . Hypertensive heart disease 03/02/2019    Past Medical History: Past Medical History:  Diagnosis Date  . Anxiety and depression   . Atrial flutter ([place])   . Chronic systolic CHF (congestive heart failure) (H[facility])    a. dx in setting of atrial fib/flutter - possibly tachy mediated. Coronary CTA with only mild CAD in 04/2019.  Marland Kitchen CKD (chronic kidney disease), stage II   . Confusion    a. persistent confusion during 04/2019 admission of unclear cause. Home meds adjusted. CT/MRI brain nonacute.  . Diabetes ([place])   . Edema   . Elevated liver function tests   . Hypercholesteremia   . Hyperkalemia   . Hypertension   . Hypertensive heart and chronic kidney disease with systolic congestive heart failure ([place])   . Hyperthyroidism   . Hypokalemia   . Hypokalemia   . Hypomagnesemia   . Hyponatremia   . Iron deficiency anemia   . Mild CAD    a. Coronary CT 04/2019 - Minimal, Non-obstructive CAD.  Marland Kitchen NICM (nonischemic cardiomyopathy) ([place])   . Persistent atrial fibrillation ([place])   . Prolonged QT interval     Past Surgical History: Past Surgical History:  Procedure Laterality Date  . CARDIOVERSION N/A 04/29/2019   Procedure: CARDIOVERSION;  Surgeon: Sanda Klein, MD;  Location: [facility] ENDOSCOPY;  Service: Cardiovascular;   Laterality: N/A;  . CARDIOVERSION N/A 05/04/2019   Procedure: CARDIOVERSION;  Surgeon: Josue Hector, MD;  Location: [facility] ENDOSCOPY;  Service: Cardiovascular;  Laterality: N/A;  . TEE WITHOUT CARDIOVERSION  04/29/2019  . TEE WITHOUT CARDIOVERSION N/A 04/29/2019   Procedure: TRANSESOPHAGEAL ECHOCARDIOGRAM (TEE);  Surgeon: Sanda Klein, MD;  Location: [facility] ENDOSCOPY;  Service: Cardiovascular;  Laterality: N/A;  . TUBAL LIGATION      Social History: Social History   Tobacco Use  . Smoking status: Former Smoker    Packs/day: 2.00    Years: 30.00    Pack years: 60.00    Types: Cigarettes    Quit date: 2001    Years since quitting: 19.8  . Smokeless tobacco: Never Used  Substance Use Topics  . Alcohol use: Never    Frequency: Never  . Drug use: Never   Additional social history: Lives  with daughter and grandson Please also refer to relevant sections of EMR.  Family History: Family History  Problem Relation Age of Onset  . Cancer Mother   . Heart disease Mother   . Hypercholesterolemia Mother   . Osteoarthritis Mother   . Stroke Mother   . Heart disease Father   . Hypercholesterolemia Brother    (If not completed, MUST add something in)  Allergies and Medications: Allergies  Allergen Reactions  . Ambien [Zolpidem Tartrate]     Pt and family state this med makes the patient sleepwalk.   No current facility-administered medications on file prior to encounter.    Current Outpatient Medications on File Prior to Encounter  Medication Sig Dispense Refill  . albuterol (VENTOLIN HFA) 108 (90 Base) MCG/ACT inhaler Inhale 2 puffs into the lungs every 4 (four) hours as needed for wheezing or shortness of breath.     . ALPRAZolam (XANAX) 0.5 MG tablet Take 0.5 mg by mouth See admin instructions. Take one tablet (0.5 mg) by mouth twice daily - mid-day and bedtime    . amiodarone (PACERONE) 200 MG tablet Take 200 mg by mouth 2 (two) times daily.    . Ascorbic Acid (VITAMIN C) 1000 MG  tablet Take 1,000 mg by mouth daily.    . cetirizine (ZYRTEC) 10 MG tablet Take 10 mg by mouth daily as needed for allergies.     . cholecalciferol (VITAMIN D3) 25 MCG (1000 UT) tablet Take 1,000 Units by mouth daily.    . DULoxetine (CYMBALTA) 60 MG capsule Take 60 mg by mouth 2 (two) times daily.    . fenofibrate (TRICOR) 48 MG tablet Take 48 mg by mouth at bedtime.    . furosemide (LASIX) 40 MG tablet Take 1 tablet (40 mg total) by mouth 2 (two) times daily. (Patient taking differently: Take 40 mg by mouth daily. ) 60 tablet 3  . ibuprofen (ADVIL) 200 MG tablet Take 200-400 mg by mouth 2 (two) times daily as needed for headache (pain).    . Menthol, Topical Analgesic, (ICY HOT ADVANCED RELIEF EX) Apply 1 application topically 3 (three) times daily as needed (pain).     . methimazole (TAPAZOLE) 5 MG tablet Take 5 mg by mouth at bedtime.     . metoprolol succinate (TOPROL-XL) 100 MG 24 hr tablet Take 50 mg by mouth 2 (two) times daily.     . rivaroxaban (XARELTO) 20 MG TABS tablet Take 1 tablet (20 mg total) by mouth daily with supper. (Patient taking differently: Take 20 mg by mouth daily after supper. ) 30 tablet 6  . traMADol (ULTRAM) 50 MG tablet Take 50 mg by mouth 4 (four) times daily as needed (pain).     Marland Kitchen doxycycline (VIBRA-TABS) 100 MG tablet Take 100 mg by mouth 2 (two) times daily.    . potassium chloride SA (KLOR-CON) 20 MEQ tablet Take 20 mEq by mouth daily.    . sacubitril-valsartan (ENTRESTO) 24-26 MG Take 1 tablet by mouth 2 (two) times daily. 60 tablet 2  . spironolactone (ALDACTONE) 25 MG tablet Take 0.5 tablets (12.5 mg total) by mouth daily. 15 tablet 6    Objective: BP 103/67   Pulse 62   Temp 97.8 F (36.6 C) (Oral)   Resp (!) 21   LMP  (LMP Unknown) Comment: ?10 years ago?  SpO2 100%  Exam: General: 58 year old female in no acute distress Eyes: PERRLA, EOMs intact ENTM: Mucous membranes moist Neck: Supple, no JVD appreciated Cardiovascular: Regular rate and  rhythm, holosystolic murmur, right AV fistula  Respiratory: Chest clear to auscultation bilaterally, no increased work of breathing, no crackles, no rhonchi noted Gastrointestinal: Soft, mild tenderness to left lower quadrant on palpation, nondistended, bowel sounds present MSK: Moving all extremities, bilateral lower extremity 2+ edema up to knee Derm: Multiple ecchymosis and arms and legs.  Telangiectasias left maxillary area Neuro: Alert and orientated x4 Psych: Pleasant and cooperative, obeys commands and answers questions appropriately  Labs and Imaging: CBC BMET  Recent Labs  Lab 06/28/19 1830  WBC 12.8*  HGB 6.5*  HCT 21.0*  PLT 410*   Recent Labs  Lab 06/28/19 1830  NA 130*  K 3.3*  CL 93*  CO2 22  BUN 40*  CREATININE 2.15*  GLUCOSE 60*  CALCIUM 8.5*     EKG:  Sinus rhythm Short PR interval Nonspecific T abnormalities, lateral leads Borderline prolonged QT interval Confirmed by Sherwood Gambler [phone]) on 06/28/2019 7:15:12 PM    Dg Chest Portable 1 View  Result Date: 06/28/2019 CLINICAL DATA:  Shortness of breath, CHF EXAM: PORTABLE CHEST 1 VIEW COMPARISON:  None. FINDINGS: Mild cardiomegaly. Both lungs are clear. The visualized skeletal structures are unremarkable. IMPRESSION: Mild cardiomegaly without acute abnormality of the lungs in AP portable projection. Electronically Signed   By: Eddie Candle M.D.   On: 06/28/2019 19:14    Carollee Leitz, MD 06/28/2019, 10:19 PM PGY-1, [place] Intern pager: [phone], text pages welcome  FPTS Upper-Level Resident Addendum   I have independently interviewed and examined the patient. I have discussed the above with the original author and agree with their documentation. My edits for correction/addition/clarification are in blue. Please see also any attending notes.    Kathrene Alu, MD PGY-3, [facility] 06/29/2019 2:43 AM  FPTS Service pager: [phone] (text pages welcome  through [facility])

## 2019-06-28 NOTE — ED Triage Notes (Addendum)
BIB EMS from home. Alert and oriented x 4.  C/O diarrhea, weakness, and poor appetite x 3 days. Edema and weeping present in lower extremities. EMS reports BP 87/34. Patient states she has fallen multiple times over the past 3 days. C/o left shoulder pain from one of her falls rated as 6/10 on pain scale.

## 2019-06-29 ENCOUNTER — Encounter (HOSPITAL_COMMUNITY)
Admission: EM | Disposition: A | Discharge status: Home / Self Care | Payer: Self-pay | Source: Home / Self Care | Attending: Family Medicine

## 2019-06-29 ENCOUNTER — Inpatient Hospital Stay (HOSPITAL_COMMUNITY): Payer: Medicare HMO | Admitting: Anesthesiology

## 2019-06-29 ENCOUNTER — Encounter (HOSPITAL_COMMUNITY): Payer: Self-pay | Admitting: *Deleted

## 2019-06-29 DIAGNOSIS — I5022 Chronic systolic (congestive) heart failure: Secondary | ICD-10-CM | POA: Diagnosis not present

## 2019-06-29 DIAGNOSIS — D509 Iron deficiency anemia, unspecified: Secondary | ICD-10-CM | POA: Diagnosis not present

## 2019-06-29 DIAGNOSIS — E860 Dehydration: Secondary | ICD-10-CM | POA: Diagnosis present

## 2019-06-29 DIAGNOSIS — K254 Chronic or unspecified gastric ulcer with hemorrhage: Secondary | ICD-10-CM | POA: Diagnosis not present

## 2019-06-29 DIAGNOSIS — N179 Acute kidney failure, unspecified: Secondary | ICD-10-CM | POA: Diagnosis not present

## 2019-06-29 DIAGNOSIS — R7989 Other specified abnormal findings of blood chemistry: Secondary | ICD-10-CM

## 2019-06-29 DIAGNOSIS — M1990 Unspecified osteoarthritis, unspecified site: Secondary | ICD-10-CM | POA: Diagnosis present

## 2019-06-29 DIAGNOSIS — N182 Chronic kidney disease, stage 2 (mild): Secondary | ICD-10-CM | POA: Diagnosis present

## 2019-06-29 DIAGNOSIS — E876 Hypokalemia: Secondary | ICD-10-CM | POA: Diagnosis not present

## 2019-06-29 DIAGNOSIS — I2510 Atherosclerotic heart disease of native coronary artery without angina pectoris: Secondary | ICD-10-CM | POA: Diagnosis not present

## 2019-06-29 DIAGNOSIS — T45515A Adverse effect of anticoagulants, initial encounter: Secondary | ICD-10-CM | POA: Diagnosis not present

## 2019-06-29 DIAGNOSIS — F410 Panic disorder [episodic paroxysmal anxiety] without agoraphobia: Secondary | ICD-10-CM | POA: Diagnosis present

## 2019-06-29 DIAGNOSIS — Z7901 Long term (current) use of anticoagulants: Secondary | ICD-10-CM | POA: Diagnosis not present

## 2019-06-29 DIAGNOSIS — K259 Gastric ulcer, unspecified as acute or chronic, without hemorrhage or perforation: Secondary | ICD-10-CM

## 2019-06-29 DIAGNOSIS — I5020 Unspecified systolic (congestive) heart failure: Secondary | ICD-10-CM

## 2019-06-29 DIAGNOSIS — Z85038 Personal history of other malignant neoplasm of large intestine: Secondary | ICD-10-CM | POA: Diagnosis not present

## 2019-06-29 DIAGNOSIS — I959 Hypotension, unspecified: Secondary | ICD-10-CM | POA: Diagnosis present

## 2019-06-29 DIAGNOSIS — T39395A Adverse effect of other nonsteroidal anti-inflammatory drugs [NSAID], initial encounter: Secondary | ICD-10-CM | POA: Diagnosis not present

## 2019-06-29 DIAGNOSIS — D649 Anemia, unspecified: Secondary | ICD-10-CM | POA: Diagnosis not present

## 2019-06-29 DIAGNOSIS — I130 Hypertensive heart and chronic kidney disease with heart failure and stage 1 through stage 4 chronic kidney disease, or unspecified chronic kidney disease: Secondary | ICD-10-CM | POA: Diagnosis not present

## 2019-06-29 DIAGNOSIS — E0590 Thyrotoxicosis, unspecified without thyrotoxic crisis or storm: Secondary | ICD-10-CM | POA: Diagnosis not present

## 2019-06-29 DIAGNOSIS — Z79899 Other long term (current) drug therapy: Secondary | ICD-10-CM | POA: Diagnosis not present

## 2019-06-29 DIAGNOSIS — R748 Abnormal levels of other serum enzymes: Secondary | ICD-10-CM | POA: Diagnosis present

## 2019-06-29 DIAGNOSIS — D62 Acute posthemorrhagic anemia: Secondary | ICD-10-CM | POA: Diagnosis not present

## 2019-06-29 DIAGNOSIS — R197 Diarrhea, unspecified: Secondary | ICD-10-CM | POA: Diagnosis present

## 2019-06-29 DIAGNOSIS — K921 Melena: Secondary | ICD-10-CM

## 2019-06-29 DIAGNOSIS — I4891 Unspecified atrial fibrillation: Secondary | ICD-10-CM

## 2019-06-29 DIAGNOSIS — K3189 Other diseases of stomach and duodenum: Secondary | ICD-10-CM | POA: Diagnosis not present

## 2019-06-29 DIAGNOSIS — K922 Gastrointestinal hemorrhage, unspecified: Secondary | ICD-10-CM | POA: Diagnosis not present

## 2019-06-29 DIAGNOSIS — I4892 Unspecified atrial flutter: Secondary | ICD-10-CM | POA: Diagnosis not present

## 2019-06-29 DIAGNOSIS — K250 Acute gastric ulcer with hemorrhage: Secondary | ICD-10-CM

## 2019-06-29 DIAGNOSIS — Z87891 Personal history of nicotine dependence: Secondary | ICD-10-CM | POA: Diagnosis not present

## 2019-06-29 DIAGNOSIS — Z20828 Contact with and (suspected) exposure to other viral communicable diseases: Secondary | ICD-10-CM | POA: Diagnosis not present

## 2019-06-29 DIAGNOSIS — D500 Iron deficiency anemia secondary to blood loss (chronic): Secondary | ICD-10-CM | POA: Diagnosis not present

## 2019-06-29 DIAGNOSIS — I4819 Other persistent atrial fibrillation: Secondary | ICD-10-CM | POA: Diagnosis not present

## 2019-06-29 DIAGNOSIS — I480 Paroxysmal atrial fibrillation: Secondary | ICD-10-CM | POA: Diagnosis not present

## 2019-06-29 DIAGNOSIS — F411 Generalized anxiety disorder: Secondary | ICD-10-CM | POA: Diagnosis present

## 2019-06-29 DIAGNOSIS — K253 Acute gastric ulcer without hemorrhage or perforation: Secondary | ICD-10-CM | POA: Diagnosis not present

## 2019-06-29 HISTORY — PX: ESOPHAGOGASTRODUODENOSCOPY (EGD) WITH PROPOFOL: SHX5813

## 2019-06-29 HISTORY — PX: ESOPHAGOGASTRODUODENOSCOPY: SHX1529

## 2019-06-29 HISTORY — PX: BIOPSY: SHX5522

## 2019-06-29 LAB — COMPREHENSIVE METABOLIC PANEL WITH GFR
ALT: 110 U/L — ABNORMAL HIGH (ref 0–44)
AST: 108 U/L — ABNORMAL HIGH (ref 15–41)
Albumin: 2.4 g/dL — ABNORMAL LOW (ref 3.5–5.0)
Alkaline Phosphatase: 95 U/L (ref 38–126)
Anion gap: 10 (ref 5–15)
BUN: 34 mg/dL — ABNORMAL HIGH (ref 6–20)
CO2: 23 mmol/L (ref 22–32)
Calcium: 8.1 mg/dL — ABNORMAL LOW (ref 8.9–10.3)
Chloride: 99 mmol/L (ref 98–111)
Creatinine, Ser: 1.86 mg/dL — ABNORMAL HIGH (ref 0.44–1.00)
GFR calc Af Amer: 34 mL/min — ABNORMAL LOW
GFR calc non Af Amer: 29 mL/min — ABNORMAL LOW
Glucose, Bld: 91 mg/dL (ref 70–99)
Potassium: 3.3 mmol/L — ABNORMAL LOW (ref 3.5–5.1)
Sodium: 132 mmol/L — ABNORMAL LOW (ref 135–145)
Total Bilirubin: 0.2 mg/dL — ABNORMAL LOW (ref 0.3–1.2)
Total Protein: 4.4 g/dL — ABNORMAL LOW (ref 6.5–8.1)

## 2019-06-29 LAB — BPAM RBC
Blood Product Expiration Date: 202012122359
ISSUE DATE / TIME: 202011082221
Unit Type and Rh: 5100

## 2019-06-29 LAB — HEPATITIS B SURFACE ANTIGEN: Hepatitis B Surface Ag: NONREACTIVE

## 2019-06-29 LAB — GLUCOSE, CAPILLARY: Glucose-Capillary: 104 mg/dL — ABNORMAL HIGH (ref 70–99)

## 2019-06-29 LAB — CBC
HCT: 24.7 % — ABNORMAL LOW (ref 36.0–46.0)
HCT: 23.8 % — ABNORMAL LOW (ref 36.0–46.0)
Hemoglobin: 7.6 g/dL — ABNORMAL LOW (ref 12.0–15.0)
Hemoglobin: 7.5 g/dL — ABNORMAL LOW (ref 12.0–15.0)
MCH: 28.7 pg (ref 26.0–34.0)
MCH: 29.1 pg (ref 26.0–34.0)
MCHC: 30.8 g/dL (ref 30.0–36.0)
MCHC: 31.5 g/dL (ref 30.0–36.0)
MCV: 93.2 fL (ref 80.0–100.0)
MCV: 92.2 fL (ref 80.0–100.0)
Platelets: 317 10*3/uL (ref 150–400)
Platelets: 298 10*3/uL (ref 150–400)
RBC: 2.65 MIL/uL — ABNORMAL LOW (ref 3.87–5.11)
RBC: 2.58 MIL/uL — ABNORMAL LOW (ref 3.87–5.11)
RDW: 19.5 % — ABNORMAL HIGH (ref 11.5–15.5)
RDW: 19.3 % — ABNORMAL HIGH (ref 11.5–15.5)
WBC: 8.6 10*3/uL (ref 4.0–10.5)
WBC: 8 10*3/uL (ref 4.0–10.5)
nRBC: 0 % (ref 0.0–0.2)
nRBC: 0 % (ref 0.0–0.2)

## 2019-06-29 LAB — TYPE AND SCREEN
ABO/RH(D): O POS
Antibody Screen: NEGATIVE
Unit division: 0

## 2019-06-29 LAB — SARS CORONAVIRUS 2 (TAT 6-24 HRS): SARS Coronavirus 2: NEGATIVE

## 2019-06-29 LAB — ABO/RH: ABO/RH(D): O POS

## 2019-06-29 SURGERY — ESOPHAGOGASTRODUODENOSCOPY (EGD) WITH PROPOFOL
Anesthesia: Monitor Anesthesia Care

## 2019-06-29 MED ORDER — PROPOFOL 10 MG/ML IV BOLUS
INTRAVENOUS | Status: DC | PRN
  Administered 2019-06-29: 20 mg via INTRAVENOUS

## 2019-06-29 MED ORDER — BUTAMBEN-TETRACAINE-BENZOCAINE 2-2-14 % EX AERO
INHALATION_SPRAY | CUTANEOUS | Status: DC | PRN
  Administered 2019-06-29: 2 via TOPICAL

## 2019-06-29 MED ORDER — SODIUM CHLORIDE 0.9 % IV BOLUS
500.0000 mL | Freq: Once | INTRAVENOUS | Status: AC
  Administered 2019-06-29: 500 mL via INTRAVENOUS

## 2019-06-29 MED ORDER — POTASSIUM CHLORIDE 10 MEQ/100ML IV SOLN
10.0000 meq | INTRAVENOUS | Status: AC
  Administered 2019-06-29 (×2): 10 meq via INTRAVENOUS

## 2019-06-29 MED ORDER — POTASSIUM CHLORIDE CRYS ER 20 MEQ PO TBCR
40.0000 meq | EXTENDED_RELEASE_TABLET | ORAL | Status: AC
  Administered 2019-06-29: 40 meq via ORAL
  Filled 2019-06-29 (×2): qty 2

## 2019-06-29 MED ORDER — LACTATED RINGERS IV SOLN
INTRAVENOUS | Status: DC
  Administered 2019-06-29 (×2): via INTRAVENOUS

## 2019-06-29 MED ORDER — ONDANSETRON HCL 4 MG/2ML IJ SOLN
INTRAMUSCULAR | Status: DC | PRN
  Administered 2019-06-29: 4 mg via INTRAVENOUS

## 2019-06-29 MED ORDER — PROPOFOL 500 MG/50ML IV EMUL
INTRAVENOUS | Status: DC | PRN
  Administered 2019-06-29: 50 ug/kg/min via INTRAVENOUS

## 2019-06-29 MED ORDER — SODIUM CHLORIDE 0.9 % IV SOLN: INTRAVENOUS | Status: DC

## 2019-06-29 MED ORDER — POTASSIUM CHLORIDE 10 MEQ/100ML IV SOLN
INTRAVENOUS | Status: AC
  Administered 2019-06-29: 10 meq
  Filled 2019-06-29: qty 100

## 2019-06-29 SURGICAL SUPPLY — 14 items

## 2019-06-29 NOTE — Transfer of Care (Signed)
Immediate Anesthesia Transfer of Care Note  Patient: Brianna Porter  Procedure(s) Performed: ESOPHAGOGASTRODUODENOSCOPY (EGD) WITH PROPOFOL (N/A ) BIOPSY  Patient Location: Endoscopy Unit  Anesthesia Type:MAC  Level of Consciousness: awake, alert  and sedated  Airway & Oxygen Therapy: Patient connected to nasal cannula oxygen  Post-op Assessment: Post -op Vital signs reviewed and stable  Post vital signs: stable  Last Vitals:  Vitals Value Taken Time  BP    Temp    Pulse    Resp    SpO2      Last Pain:  Vitals:   06/29/19 1154  TempSrc: Oral  PainSc: 0-No pain      Patients Stated Pain Goal: 0 (76/39/43 2003)  Complications: No apparent anesthesia complications

## 2019-06-29 NOTE — Progress Notes (Signed)
Family Medicine Teaching Service Daily Progress Note Intern Pager: [phone]  Patient name: Brianna Porter Medical record number: [id number] Date of birth: [birth year] Age: 58 y.o. Gender: female  Primary Care Provider: Cher Nakai, MD Consultants: GI Code Status: FULL  Pt Overview and Major Events to Date:  11/9 Admitted for GIB, s/p 1U pRBCs  Assessment and Plan: Brianna Porter is a 58 y.o. female presenting with diarrhea and generalized weakness. PMH is significant for HFrEF with EF 20-25%, paroxysmal atrial fibrillation on amiodarone and Xarelto, hyperthyroidism, osteoarthritis, and anxiety.  Symptomatic anemia due to GI bleed likely secondary to NSAID use Hemoglobin holding steady this AM at 7.5. Pt is feeling tired, but AOx4. VSS with BP 122/65, HR 72. Pt has +2 edema to knees bilaterally, holding IVF at this time. -Hold Xarelto -NO NSAIDs, patient counseled -Serial CBC q4h -Consult GI -NPO for possible EGD  -PPI IV BID  Elevated liver enzymes AST 130>108, ALT 137>110, improving.  Previously ALT 63, AST 41. Possible causes of elevated liver enzymes include ibuprofen, Xarelto.  Also considered hepatitis as etiology as well as mild liver shock due to low blood pressures and acute blood loss. Hep B s ag non reactive.  -F/U Hepatitis C panel -NO NSAIDs -Continue to monitor  AKI likely secondary to combination of dehydration and NSAID use Creatinine 2.15 at admission improved to 1.86 this AM.  Baseline 0.77-0.92(04/2019). Likely multifactorial reasons for elevated creatinine: Diarrhea, mild dehydration, Lasix use daily, NSAID use daily.  -avoid nephrotoxic agents -consider renal ultrasound if no improvement - F/U FEurea   HFrEF TEE 04/2019 LVEF 20-25%. Patient reports dry weight 130 pounds.  On exam lung sounds clear to auscultation.  No JVD appreciated.  Bilateral lower extremity 2+ edema  up to knee, which could also be due to NSAID use.  No pulmonary edema or vascular  congestion visualized on portable CXR. Awaiting weight today. -weigh daily -strict intake and output -Consider consulting cardiology for echo (would repeat be helpful?) -Hold diureses in light of elevated creatinine.  Hypokalemia  Potassium 3.3 at admission and this morning. Pt received Klor-con 51mEq in ED, then 53mEq x1 this AM.  Due to NPO status, pt now receiving potassium cholride 89mEq in 173mL IVPB x3.  -Replete electrolytes as needed - BMP in AM -hold Lasix d/t dehydration, AKI, low BP  Paroxysmal A. Fib with H/O Cardioversion EKG shows sinus rhythm.  Home medication Xarelto 20 mg daily, metoprolol 50 mg twice daily and Amiodarone 200 mg twice daily.  Patient was taking Eliquis but was recently switched to Xarelto due to itching. -Hold Xarelto for acute GI bleed, hold metoprolol for low BP -Continue amiodarone 200 mg twice daily, hold if MAP<60  Hyperthyroidism Asymptomatic.  Home medication methimazole 5 mg nightly -Continue methimazole at night  Osteoarthritis Patient has been taking tylenol, ibuprofen 400 mg daily long term, and tramadol 50 mg four times daily PRN. -continue tylenol 650 mg Q6H PRN and tramadol 50 mg Q12H PRN for pain      Anxiety Patient reports panic attacks and generalized anxiety and takes Xanax 0.5 mg BID as well as cymbalta 60 mg BID at home. -continue Xanax -hold cymbalta due to AKI  FEN/GI:  NPO  Prophylaxis:  SCD  Disposition: to cardiac telemetry pending further assessment by GI  Subjective:  Patient is very tired today, but AO x4.  She was reassured that her hemoglobin was holding steady at 7.5.  Objective: Temp:  [97.7 F (36.5 C)-98.9 F (37.2 C)] 98.9 F (37.2  C) (11/09 [id number]) Pulse Rate:  [58-95] 66 (11/09 0600) Resp:  [12-23] 16 (11/09 0643) BP: (74-132)/(32-79) 132/55 (11/09 0643) SpO2:  [96 %-100 %] 100 % (11/09 0643) Weight:  [62.9 kg] 62.9 kg (11/09 0643) Physical Exam: General: pale, tired woman able to  participate in exam Cardiovascular: RRR, no m/r/g Respiratory: CTAB Abdomen: soft, NT, ND Extremities: 2+ bilateral LE edema to knees  Laboratory: Recent Labs  Lab 06/28/19 1830 06/29/19 0258 06/29/19 0437  WBC 12.8* 8.6 8.0  HGB 6.5* 7.6* 7.5*  HCT 21.0* 24.7* 23.8*  PLT 410* 317 298   Recent Labs  Lab 06/28/19 1830 06/29/19 0258  NA 130* 132*  K 3.3* 3.3*  CL 93* 99  CO2 22 23  BUN 40* 34*  CREATININE 2.15* 1.86*  CALCIUM 8.5* 8.1*  PROT 5.2* 4.4*  BILITOT 0.6 0.2*  ALKPHOS 106 95  ALT 137* 110*  AST 130* 108*  GLUCOSE 60* 91    Imaging/Diagnostic Tests: Dg Chest Portable 1 View  Result Date: 06/28/2019 CLINICAL DATA:  Shortness of breath, CHF EXAM: PORTABLE CHEST 1 VIEW COMPARISON:  None. FINDINGS: Mild cardiomegaly. Both lungs are clear. The visualized skeletal structures are unremarkable. IMPRESSION: Mild cardiomegaly without acute abnormality of the lungs in AP portable projection. Electronically Signed   By: Eddie Candle M.D.   On: 06/28/2019 19:14   [name], MD 06/29/2019, 7:33 AM PGY-1, [place] Intern pager: [phone], text pages welcome

## 2019-06-29 NOTE — Progress Notes (Signed)
PT Cancellation Note  Patient Details Name: Brianna Porter MRN: [id number] DOB: 11-06-1960   Cancelled Treatment:    Reason Eval/Treat Not Completed: Patient at procedure or test/unavailable. Pt on her way to endo. Will re-attempt tomorrow.   Shary Decamp [facility] 06/29/2019, 11:44 AM  [place] Pager [phone] Office [phone]

## 2019-06-29 NOTE — Consult Note (Addendum)
Consultation  Referring Provider:  Dr. Owens Shark    Primary Care Physician:  Cher Nakai, MD Primary Gastroenterologist: Althia Forts        Reason for Consultation: Melena, Acute blood loss anemia             HPI:   Brianna Porter is a 58 y.o. female with a past medical history as listed below including CKD stage II, chronic systolic CHF (EF 0000000) and A. fib on Xarelto, who presented to the ER on 06/28/2019 for diarrhea and generalized weakness.  Upon arrival patient was found to have a hemoglobin of 6.5 with reports of melena and we were consulted.    Today, the patient explains that she has had diarrhea for the past 3 days occur mostly at night.  This started out of the blue.  Her stools appeared black and tarry with some bright red blood as well and the stools occured around 3-4 times a night.  Has been taking Imodium during the day which would provide her relief for about 10 hours but then the diarrhea would come right back.  Also describes an epigastric abdominal pain after eating with a "burning sensation".  This has been occurring for at least a week.  Tells me that for the better part of a year she has been using up to 12 ibuprofen a day for her osteoarthritis because she "could not afford my other medicine".  Most recently she thought this may be bad for her, so would limit this to 2 ibuprofen tabs 2 or 3 times a day.  Associated symptoms include dizziness and presyncope with activity as well as shortness of breath.    Family history of colon cancer.  Patient reports her last colonoscopy 2 years ago in [place].    Denies fever, chills or weight loss.  ED course: Soft blood pressures, AST 130, ALT 137, creatinine 2.15, hemoglobin 6.5, WBCs 12.8, given 1 unit PRBCs  Past Medical History:  Diagnosis Date  . Anxiety and depression   . Atrial flutter ([place])   . Chronic systolic CHF (congestive heart failure) (HCC)    a. dx in setting of atrial fib/flutter - possibly tachy mediated.  Coronary CTA with only mild CAD in 04/2019.  Marland Kitchen CKD (chronic kidney disease), stage II   . Confusion    a. persistent confusion during 04/2019 admission of unclear cause. Home meds adjusted. CT/MRI brain nonacute.  . Diabetes ([place])   . Edema   . Elevated liver function tests   . Hypercholesteremia   . Hyperkalemia   . Hypertension   . Hypertensive heart and chronic kidney disease with systolic congestive heart failure ([place])   . Hyperthyroidism   . Hypokalemia   . Hypokalemia   . Hypomagnesemia   . Hyponatremia   . Iron deficiency anemia   . Mild CAD    a. Coronary CT 04/2019 - Minimal, Non-obstructive CAD.  Marland Kitchen NICM (nonischemic cardiomyopathy) ([place])   . Persistent atrial fibrillation ([place])   . Prolonged QT interval     Past Surgical History:  Procedure Laterality Date  . CARDIOVERSION N/A 04/29/2019   Procedure: CARDIOVERSION;  Surgeon: Sanda Klein, MD;  Location: [facility];  Service: Cardiovascular;  Laterality: N/A;  . CARDIOVERSION N/A 05/04/2019   Procedure: CARDIOVERSION;  Surgeon: Josue Hector, MD;  Location: [facility] ENDOSCOPY;  Service: Cardiovascular;  Laterality: N/A;  . TEE WITHOUT CARDIOVERSION  04/29/2019  . TEE WITHOUT CARDIOVERSION N/A 04/29/2019   Procedure: TRANSESOPHAGEAL ECHOCARDIOGRAM (TEE);  Surgeon: Croitoru,  Dani Gobble, MD;  Location: [facility];  Service: Cardiovascular;  Laterality: N/A;  . TUBAL LIGATION      Family History  Problem Relation Age of Onset  . Cancer Mother   . Heart disease Mother   . Hypercholesterolemia Mother   . Osteoarthritis Mother   . Stroke Mother   . Heart disease Father   . Hypercholesterolemia Brother     Social History   Tobacco Use  . Smoking status: Former Smoker    Packs/day: 2.00    Years: 30.00    Pack years: 60.00    Types: Cigarettes    Quit date: 2001    Years since quitting: 19.8  . Smokeless tobacco: Never Used  Substance Use Topics  . Alcohol use: Never    Frequency: Never  . Drug use: Never     Prior to Admission medications   Medication Sig Start Date End Date Taking? Authorizing Provider  albuterol (VENTOLIN HFA) 108 (90 Base) MCG/ACT inhaler Inhale 2 puffs into the lungs every 4 (four) hours as needed for wheezing or shortness of breath.  06/06/19  Yes [provider]  ALPRAZolam Duanne Moron) 0.5 MG tablet Take 0.5 mg by mouth See admin instructions. Take one tablet (0.5 mg) by mouth twice daily - mid-day and bedtime 02/14/19  Yes [provider]  amiodarone (PACERONE) 200 MG tablet Take 200 mg by mouth 2 (two) times daily.   Yes [provider]  Ascorbic Acid (VITAMIN C) 1000 MG tablet Take 1,000 mg by mouth daily.   Yes [provider]  cetirizine (ZYRTEC) 10 MG tablet Take 10 mg by mouth daily as needed for allergies.    Yes [provider]  cholecalciferol (VITAMIN D3) 25 MCG (1000 UT) tablet Take 1,000 Units by mouth daily.   Yes [provider]  DULoxetine (CYMBALTA) 60 MG capsule Take 60 mg by mouth 2 (two) times daily. 02/26/19  Yes [provider]  fenofibrate (TRICOR) 48 MG tablet Take 48 mg by mouth at bedtime. 02/26/19  Yes [provider]  furosemide (LASIX) 40 MG tablet Take 1 tablet (40 mg total) by mouth 2 (two) times daily. Patient taking differently: Take 40 mg by mouth daily.  04/23/19  Yes Richardo Priest, MD  ibuprofen (ADVIL) 200 MG tablet Take 200-400 mg by mouth 2 (two) times daily as needed for headache (pain).   Yes [provider]  Menthol, Topical Analgesic, (ICY HOT ADVANCED RELIEF EX) Apply 1 application topically 3 (three) times daily as needed (pain).    Yes [provider]  methimazole (TAPAZOLE) 5 MG tablet Take 5 mg by mouth at bedtime.  02/21/19  Yes [provider]  metoprolol succinate (TOPROL-XL) 100 MG 24 hr tablet Take 50 mg by mouth 2 (two) times daily.  06/09/19  Yes [provider]  rivaroxaban (XARELTO) 20 MG TABS tablet Take 1 tablet (20 mg total)  by mouth daily with supper. Patient taking differently: Take 20 mg by mouth daily after supper.  05/07/19  Yes Furth, Cadence H, PA-C  traMADol (ULTRAM) 50 MG tablet Take 50 mg by mouth 4 (four) times daily as needed (pain).  06/09/19  Yes [provider]  doxycycline (VIBRA-TABS) 100 MG tablet Take 100 mg by mouth 2 (two) times daily. 06/12/19   [provider]  potassium chloride SA (KLOR-CON) 20 MEQ tablet Take 20 mEq by mouth daily.    [provider]  sacubitril-valsartan (ENTRESTO) 24-26 MG Take 1 tablet by mouth 2 (two)  times daily. 06/12/19   Richardo Priest, MD  spironolactone (ALDACTONE) 25 MG tablet Take 0.5 tablets (12.5 mg total) by mouth daily. 05/08/19   Furth, Cadence H, PA-C    Current Facility-Administered Medications  Medication Dose Route Frequency Provider Last Rate Last Dose  . acetaminophen (TYLENOL) tablet 650 mg  650 mg Oral Q6H PRN Kathrene Alu, MD   650 mg at 06/29/19 [id number]   Or  . acetaminophen (TYLENOL) suppository 650 mg  650 mg Rectal Q6H PRN Winfrey, Alcario Drought, MD      . albuterol (VENTOLIN HFA) 108 (90 Base) MCG/ACT inhaler 2 puff  2 puff Inhalation Q4H PRN Kathrene Alu, MD      . ALPRAZolam Duanne Moron) tablet 0.5 mg  0.5 mg Oral BID PRN Kathrene Alu, MD      . amiodarone (PACERONE) tablet 200 mg  200 mg Oral BID Kathrene Alu, MD   200 mg at 06/29/19 0839  . methimazole (TAPAZOLE) tablet 5 mg  5 mg Oral QHS Winfrey, Amanda C, MD      . pantoprazole (PROTONIX) injection 40 mg  40 mg Intravenous Q12H Kathrene Alu, MD   40 mg at 06/29/19 0839  . potassium chloride 10 mEq in 100 mL IVPB  10 mEq Intravenous Q1 Hr x 3 Guadalupe Dawn, MD      . potassium chloride SA (KLOR-CON) CR tablet 40 mEq  40 mEq Oral Q4H Carollee Leitz, MD   40 mEq at 06/29/19 [id number]  . traMADol (ULTRAM) tablet 50 mg  50 mg Oral Q12H PRN Kathrene Alu, MD        Allergies as of 06/28/2019 - Review Complete 06/28/2019  Allergen Reaction Noted  .  Ambien [zolpidem tartrate]  04/30/2019     Review of Systems:    Constitutional: No weight loss, fever or chills Skin: No rash Cardiovascular: No chest pain Respiratory: +DOE Gastrointestinal: See HPI and otherwise negative Genitourinary: No dysuria  Neurological: No headache Musculoskeletal: No new muscle or joint pain Hematologic:+bruising and bleeding Psychiatric: No history of depression or anxiety    Physical Exam:  Vital signs in last 24 hours: Temp:  [97.7 F (36.5 C)-98.9 F (37.2 C)] 98.9 F (37.2 C) (11/09 0643) Pulse Rate:  [58-95] 66 (11/09 0600) Resp:  [12-23] 19 (11/09 0700) BP: (74-132)/(32-79) 132/55 (11/09 0643) SpO2:  [96 %-100 %] 100 % (11/09 0643) Weight:  [62.9 kg] 62.9 kg (11/09 [id number])   General:   Pleasant Caucasian female appears to be in NAD, Well developed, Well nourished, alert and cooperative Head:  Normocephalic and atraumatic. Eyes:   PEERL, EOMI. No icterus. Conjunctiva pink. Ears:  Normal auditory acuity. Neck:  Supple Throat: Oral cavity and pharynx without inflammation, swelling or lesion. Teeth in good condition. Lungs: Respirations even and unlabored. Lungs clear to auscultation bilaterally.   No wheezes, crackles, or rhonchi.  Heart: Normal S1, S2. No MRG. Regular rate and rhythm. +2+ BLE edema to level of knee, Right Av fistula Abdomen:  Soft, nondistended, moderate epigastric ttp with guarding. Normal bowel sounds. No appreciable masses or hepatomegaly. Rectal:  Not performed.  Msk:  Symmetrical without gross deformities. Peripheral pulses intact.  Extremities:  Without edema, no deformity or joint abnormality.  Neurologic:  Alert and  oriented x4;  grossly normal neurologically.  Skin:   Dry and intact without significant lesions or rashes. Bruising on all surfaces of arms and some on legs Psychiatric: Demonstrates good judgement and reason without abnormal affect or behaviors.  LAB RESULTS: Recent Labs    06/28/19 1830 06/29/19  0258 06/29/19 0437  WBC 12.8* 8.6 8.0  HGB 6.5* 7.6* 7.5*  HCT 21.0* 24.7* 23.8*  PLT 410* 317 298   BMET Recent Labs    06/28/19 1830 06/29/19 0258  NA 130* 132*  K 3.3* 3.3*  CL 93* 99  CO2 22 23  GLUCOSE 60* 91  BUN 40* 34*  CREATININE 2.15* 1.86*  CALCIUM 8.5* 8.1*   LFT Recent Labs    06/29/19 0258  PROT 4.4*  ALBUMIN 2.4*  AST 108*  ALT 110*  ALKPHOS 95  BILITOT 0.2*   STUDIES: Dg Chest Portable 1 View  Result Date: 06/28/2019 CLINICAL DATA:  Shortness of breath, CHF EXAM: PORTABLE CHEST 1 VIEW COMPARISON:  None. FINDINGS: Mild cardiomegaly. Both lungs are clear. The visualized skeletal structures are unremarkable. IMPRESSION: Mild cardiomegaly without acute abnormality of the lungs in AP portable projection. Electronically Signed   By: Eddie Candle M.D.   On: 06/28/2019 19:14    Impression / Plan:   Impression: 1.  Symptomatic anemia due to GI bleed: Started with melena 72 hours ago in the evening, none since time of arrival in the past 24 hours, history of heavy NSAID use for a year and epigastric pain with burning; most likely PUD 2.  Elevated liver enzymes: AST 108, ALT 110-consider fatty liver versus other 3.  AKI: Creatinine improving 4.  Heart failure with reduced ejection fraction: TEE 04/2019 with LVEF 20-25% 5.  Hypokalemia: Potassium 3.3, patient has been ordered potassium by hospitalist team 6.  A. Fib: on chronic anticoagulation with Xarelto, last dose 11/7 PM 7. Elevated LFT  Plan: 1.  Patient was scheduled for an EGD today with Dr. Henrene Pastor.  This was scheduled at 1245.  Did discuss risks, benefits, limitations and alternatives and the patient agrees to proceed. 2.  Continue to hold anticoagulation for now. 3.  Agree with IV PPI 4.  Discussed discontinuing NSAIDs with the patient, she understands. 5.  May need further work-up of elevated liver enzymes at some point in the future 6.  Continue to monitor hemoglobin with transfusion as needed less  than 7 7.  Please await any further recommendations from Dr. Henrene Pastor after time of procedure.  Thank you for your kind consultation, we will continue to follow.  Lavone Nian Lemmon  06/29/2019, 9:14 AM  GI ATTENDING  History, laboratories, x-rays reviewed.  Patient personally seen and examined.  Agree with comprehensive consultation note as outlined above.  Patient presents with subacute upper GI bleed in the face of chronic anticoagulation.  Chronic NSAID user.  Primary concern would be ulcer disease.  Has been off Xarelto for 2 days.  Hemoglobin stable.  For urgent EGD this afternoon.  Patient is high risk given her comorbidities, including heart failure with reduced ejection fraction, and the need for chronic anticoagulation.The nature of the procedure, as well as the risks, benefits, and alternatives were carefully and thoroughly reviewed with the patient. Ample time for discussion and questions allowed. The patient understood, was satisfied, and agreed to proceed.  Docia Chuck. Geri Seminole., M.D. [facility] Division of Gastroenterology

## 2019-06-29 NOTE — ED Notes (Signed)
ED TO INPATIENT HANDOFF REPORT  ED Nurse Name and Phone #: Fotios Amos [phone]  S Name/Age/Gender Brianna Porter 58 y.o. female Room/Bed: 027C/027C  Code Status   Code Status: Full Code  Home/SNF/Other Home Patient oriented to: self, place, time and situation Is this baseline? Yes   Triage Complete: Triage complete  Chief Complaint weakness/hypotension  Triage Note BIB EMS from home. Alert and oriented x 4.  C/O diarrhea, weakness, and poor appetite x 3 days. Edema and weeping present in lower extremities. EMS reports BP 87/34. Patient states she has fallen multiple times over the past 3 days. C/o left shoulder pain from one of her falls rated as 6/10 on pain scale.    Allergies Allergies  Allergen Reactions  . Ambien [Zolpidem Tartrate]     Pt and family state this med makes the patient sleepwalk.    Level of Care/Admitting Diagnosis ED Disposition    ED Disposition Condition [facility]: [place] [[id number]]  Level of Care: Telemetry Cardiac [103]  Covid Evaluation: Confirmed COVID Negative  Diagnosis: Symptomatic anemia [id number]  Admitting Physician: Kathrene Alu [id number]  Attending Physician: Martyn Malay [id number]  Estimated length of stay: past midnight tomorrow  Certification:: I certify this patient will need inpatient services for at least 2 midnights  PT Class (Do Not Modify): Inpatient [101]  PT Acc Code (Do Not Modify): Private [1]       B Medical/Surgery History Past Medical History:  Diagnosis Date  . Anxiety and depression   . Atrial flutter ([place])   . Chronic systolic CHF (congestive heart failure) (HCC)    a. dx in setting of atrial fib/flutter - possibly tachy mediated. Coronary CTA with only mild CAD in 04/2019.  Marland Kitchen CKD (chronic kidney disease), stage II   . Confusion    a. persistent confusion during 04/2019 admission of unclear cause. Home meds adjusted. CT/MRI brain nonacute.  . Diabetes ([place])   .  Edema   . Elevated liver function tests   . Hypercholesteremia   . Hyperkalemia   . Hypertension   . Hypertensive heart and chronic kidney disease with systolic congestive heart failure ([place])   . Hyperthyroidism   . Hypokalemia   . Hypokalemia   . Hypomagnesemia   . Hyponatremia   . Iron deficiency anemia   . Mild CAD    a. Coronary CT 04/2019 - Minimal, Non-obstructive CAD.  Marland Kitchen NICM (nonischemic cardiomyopathy) ([place])   . Persistent atrial fibrillation ([place])   . Prolonged QT interval    Past Surgical History:  Procedure Laterality Date  . CARDIOVERSION N/A 04/29/2019   Procedure: CARDIOVERSION;  Surgeon: Sanda Klein, MD;  Location: [facility] ENDOSCOPY;  Service: Cardiovascular;  Laterality: N/A;  . CARDIOVERSION N/A 05/04/2019   Procedure: CARDIOVERSION;  Surgeon: Josue Hector, MD;  Location[facility] ENDOSCOPY;  Service: Cardiovascular;  Laterality: N/A;  . TEE WITHOUT CARDIOVERSION  04/29/2019  . TEE WITHOUT CARDIOVERSION N/A 04/29/2019   Procedure: TRANSESOPHAGEAL ECHOCARDIOGRAM (TEE);  Surgeon: Sanda Klein, MD;  Location: [facility];  Service: Cardiovascular;  Laterality: N/A;  . TUBAL LIGATION       A IV Location/Drains/Wounds Patient Lines/Drains/Airways Status   Active Line/Drains/Airways    Name:   Placement date:   Placement time:   Site:   Days:   Peripheral IV 06/28/19 Antecubital   06/28/19    1826    Antecubital   1   Peripheral IV 06/28/19 Right Forearm   06/28/19  2015    Forearm   1          Intake/Output Last 24 hours  Intake/Output Summary (Last 24 hours) at 06/29/2019 0542 Last data filed at 06/29/2019 0112 Gross per 24 hour  Intake 1130 ml  Output -  Net 1130 ml    Labs/Imaging Results for orders placed or performed during the hospital encounter of 06/28/19 (from the past 48 hour(s))  Comprehensive metabolic panel     Status: Abnormal   Collection Time: 06/28/19  6:30 PM  Result Value Ref Range   Sodium 130 (L) 135 - 145 mmol/L   Potassium 3.3  (L) 3.5 - 5.1 mmol/L   Chloride 93 (L) 98 - 111 mmol/L   CO2 22 22 - 32 mmol/L   Glucose, Bld 60 (L) 70 - 99 mg/dL   BUN 40 (H) 6 - 20 mg/dL   Creatinine, Ser 2.15 (H) 0.44 - 1.00 mg/dL   Calcium 8.5 (L) 8.9 - 10.3 mg/dL   Total Protein 5.2 (L) 6.5 - 8.1 g/dL   Albumin 3.0 (L) 3.5 - 5.0 g/dL   AST 130 (H) 15 - 41 U/L   ALT 137 (H) 0 - 44 U/L   Alkaline Phosphatase 106 38 - 126 U/L   Total Bilirubin 0.6 0.3 - 1.2 mg/dL   GFR calc non Af Amer 25 (L) >60 mL/min   GFR calc Af Amer 29 (L) >60 mL/min   Anion gap 15 5 - 15    Comment: Performed at [facility], [address], [place], [place] [address]  Lipase, blood     Status: None   Collection Time: 06/28/19  6:30 PM  Result Value Ref Range   Lipase 23 11 - 51 U/L    Comment: Performed at [facility] [address], [place], [place] [address]  CBC with Differential     Status: Abnormal   Collection Time: 06/28/19  6:30 PM  Result Value Ref Range   WBC 12.8 (H) 4.0 - 10.5 K/uL   RBC 2.32 (L) 3.87 - 5.11 MIL/uL   Hemoglobin 6.5 (LL) 12.0 - 15.0 g/dL    Comment: REPEATED TO VERIFY THIS CRITICAL RESULT HAS VERIFIED AND BEEN CALLED TO PATTY PEREZ RN BY KAY WOOLLEN ON 11 08 2020 AT 1936, AND HAS BEEN READ BACK.     HCT 21.0 (L) 36.0 - 46.0 %   MCV 90.5 80.0 - 100.0 fL   MCH 28.0 26.0 - 34.0 pg   MCHC 31.0 30.0 - 36.0 g/dL   RDW 22.6 (H) 11.5 - 15.5 %   Platelets 410 (H) 150 - 400 K/uL   nRBC 0.0 0.0 - 0.2 %   Neutrophils Relative % 85 %   Neutro Abs 11.0 (H) 1.7 - 7.7 K/uL   Lymphocytes Relative 12 %   Lymphs Abs 1.5 0.7 - 4.0 K/uL   Monocytes Relative 2 %   Monocytes Absolute 0.3 0.1 - 1.0 K/uL   Eosinophils Relative 1 %   Eosinophils Absolute 0.1 0.0 - 0.5 K/uL   Basophils Relative 0 %   Basophils Absolute 0.0 0.0 - 0.1 K/uL   nRBC 0 0 /100 WBC   Abs Immature Granulocytes 0.00 0.00 - 0.07 K/uL    Comment: Performed at [facility] [address], [place], [place] [address]  Brain natriuretic peptide      Status: Abnormal   Collection Time: 06/28/19  6:30 PM  Result Value Ref Range   B Natriuretic  Peptide 168.7 (H) 0.0 - 100.0 pg/mL    Comment: Performed at [facility], [place] [address] [place], [place] [address]  Magnesium     Status: None   Collection Time: 06/28/19  6:30 PM  Result Value Ref Range   Magnesium 2.1 1.7 - 2.4 mg/dL    Comment: Performed at [facility] [address], [place], [place] [address]  Iron and TIBC     Status: Abnormal   Collection Time: 06/28/19  6:30 PM  Result Value Ref Range   Iron 39 28 - 170 ug/dL   TIBC 385 250 - 450 ug/dL   Saturation Ratios 10 (L) 10.4 - 31.8 %   UIBC 346 ug/dL    Comment: Performed at [facility], [place] [address] [place], [place] [address]  Ferritin     Status: None   Collection Time: 06/28/19  6:30 PM  Result Value Ref Range   Ferritin 191 11 - 307 ng/mL    Comment: Performed at [facility], [place] [address] [place], [place] [address]  Folate     Status: None   Collection Time: 06/28/19  6:30 PM  Result Value Ref Range   Folate 9.5 >5.9 ng/mL    Comment: Performed at [facility] [address], [place], [place] [address]  Type and screen     Status: None (Preliminary result)   Collection Time: 06/28/19  8:10 PM  Result Value Ref Range   ABO/RH(D) O POS    Antibody Screen NEG    Sample Expiration 07/01/2019,2359    Unit Number [id number]    Blood Component Type RED CELLS,LR    Unit division 00    Status of Unit ISSUED    Transfusion Status OK TO TRANSFUSE    Crossmatch Result      Compatible Performed at [facility], [place] [address], [place], [place] [address]   Reticulocytes     Status: Abnormal   Collection Time: 06/28/19  8:10 PM  Result Value Ref Range   Retic Ct Pct 5.3 (H) 0.4 - 3.1 %   RBC. 2.17 (L) 3.87 - 5.11 MIL/uL   Retic Count, Absolute 114.6 19.0 - 186.0 K/uL   Immature Retic Fract 20.9 (H) 2.3 - 15.9 %    Comment: Performed at [facility] [address] [place], [place] [address]  ABO/Rh     Status: None (Preliminary result)   Collection Time: 06/28/19  8:10 PM  Result Value Ref Range   ABO/RH(D)      O POS Performed at [facility] [address], [place], [place] [address]   POC occult blood, ED     Status: Abnormal   Collection Time: 06/28/19  8:42 PM  Result Value Ref Range   Fecal Occult Bld POSITIVE (A) NEGATIVE  Prepare RBC     Status: None   Collection Time: 06/28/19  9:00 PM  Result Value Ref Range   Order Confirmation      ORDER PROCESSED BY BLOOD BANK Performed at [facility], [place] [address], [place], [place] [address]   SARS CORONAVIRUS 2 (TAT 6-24 HRS) Nasopharyngeal Nasopharyngeal Swab     Status: None   Collection Time: 06/28/19  9:04 PM   Specimen: Nasopharyngeal Swab  Result Value Ref Range   SARS Coronavirus 2 NEGATIVE NEGATIVE    Comment: (NOTE) SARS-CoV-2 target nucleic acids are NOT DETECTED. The SARS-CoV-2 RNA is generally  detectable in upper and lower respiratory specimens during the acute phase of infection. Negative results do not preclude SARS-CoV-2 infection, do not rule out co-infections with other pathogens, and should not be used as the sole basis for treatment or other patient management decisions. Negative results must be combined with clinical observations, patient history, and epidemiological information. The expected result is Negative. Fact Sheet for Patients: SugarRoll.be Fact Sheet for Healthcare Providers: https://www.woods-mathews.com/ This test is not yet approved or cleared by the [place] FDA and  has been authorized for detection and/or diagnosis of SARS-CoV-2 by FDA under an Emergency Use Authorization (EUA). This EUA will remain  in effect (meaning this test can be used) for the duration of the COVID-19 declaration under Section 56 4(b)(1) of the Act, 21 U.S.C. section 360bbb-3(b)(1), unless the authorization is  terminated or revoked sooner. Performed at [facility], [place] [address] [place], [place] [address]   Vitamin B12     Status: Abnormal   Collection Time: 06/28/19  9:06 PM  Result Value Ref Range   Vitamin B-12 2,046 (H) 180 - 914 pg/mL    Comment: (NOTE) This assay is not validated for testing neonatal or myeloproliferative syndrome specimens for Vitamin B12 levels. Performed at [facility], [place] [address], [place], [place] [address]   Creatinine, urine, random     Status: None   Collection Time: 06/28/19 10:01 PM  Result Value Ref Range   Creatinine, Urine 25.49 mg/dL    Comment: Performed at [facility] [address], [place], [place] [address]  Comprehensive metabolic panel     Status: Abnormal   Collection Time: 06/29/19  2:58 AM  Result Value Ref Range   Sodium 132 (L) 135 - 145 mmol/L   Potassium 3.3 (L) 3.5 - 5.1 mmol/L   Chloride 99 98 - 111 mmol/L   CO2 23 22 - 32 mmol/L   Glucose, Bld 91 70 - 99 mg/dL   BUN 34 (H) 6 - 20 mg/dL   Creatinine, Ser 1.86 (H) 0.44 - 1.00 mg/dL   Calcium 8.1 (L) 8.9 - 10.3 mg/dL   Total Protein 4.4 (L) 6.5 - 8.1 g/dL   Albumin 2.4 (L) 3.5 - 5.0 g/dL   AST 108 (H) 15 - 41 U/L   ALT 110 (H) 0 - 44 U/L   Alkaline Phosphatase 95 38 - 126 U/L   Total Bilirubin 0.2 (L) 0.3 - 1.2 mg/dL   GFR calc non Af Amer 29 (L) >60 mL/min   GFR calc Af Amer 34 (L) >60 mL/min   Anion gap 10 5 - 15    Comment: Performed at [facility], [place] [address], [place], [place] [address]  CBC     Status: Abnormal   Collection Time: 06/29/19  2:58 AM  Result Value Ref Range   WBC 8.6 4.0 - 10.5 K/uL   RBC 2.65 (L) 3.87 - 5.11 MIL/uL   Hemoglobin 7.6 (L) 12.0 - 15.0 g/dL   HCT 24.7 (L) 36.0 - 46.0 %   MCV 93.2 80.0 - 100.0 fL   MCH 28.7 26.0 - 34.0 pg   MCHC 30.8 30.0 - 36.0 g/dL   RDW 19.5 (H) 11.5 - 15.5 %   Platelets 317 150 - 400 K/uL   nRBC 0.0 0.0 - 0.2 %    Comment: Performed at [facility], [place] [address],  [place],  [address]  Hepatitis B surface antigen     Status:  None   Collection Time: 06/29/19  2:58 AM  Result Value Ref Range   Hepatitis B Surface Ag NON REACTIVE NON REACTIVE    Comment: Performed at [facility] [address] [place], [place] [address]  CBC     Status: Abnormal   Collection Time: 06/29/19  4:37 AM  Result Value Ref Range   WBC 8.0 4.0 - 10.5 K/uL   RBC 2.58 (L) 3.87 - 5.11 MIL/uL   Hemoglobin 7.5 (L) 12.0 - 15.0 g/dL   HCT 23.8 (L) 36.0 - 46.0 %   MCV 92.2 80.0 - 100.0 fL   MCH 29.1 26.0 - 34.0 pg   MCHC 31.5 30.0 - 36.0 g/dL   RDW 19.3 (H) 11.5 - 15.5 %   Platelets 298 150 - 400 K/uL   nRBC 0.0 0.0 - 0.2 %    Comment: Performed at [facility], [place] [address], [place], [place] [address]   Dg Chest Portable 1 View  Result Date: 06/28/2019 CLINICAL DATA:  Shortness of breath, CHF EXAM: PORTABLE CHEST 1 VIEW COMPARISON:  None. FINDINGS: Mild cardiomegaly. Both lungs are clear. The visualized skeletal structures are unremarkable. IMPRESSION: Mild cardiomegaly without acute abnormality of the lungs in AP portable projection. Electronically Signed   By: Eddie Candle M.D.   On: 06/28/2019 19:14    Pending Labs Unresulted Labs (From admission, onward)    Start     Ordered   06/29/19 0150  Hepatitis c antibody (reflex)  Once,   STAT     06/29/19 0149   06/28/19 2143  Urea nitrogen, urine  Once,   STAT     06/28/19 2143          Vitals/Pain Today's Vitals   06/29/19 0100 06/29/19 0200 06/29/19 0427 06/29/19 0431  BP: (!) 106/54 (!) 105/55 (!) 98/46 (!) 107/57  Pulse: 63 61 62 64  Resp:   (!) 23 (!) 21  Temp:   97.7 F (36.5 C)   TempSrc:   Oral   SpO2: 100% 98% 98% 97%  PainSc:        Isolation Precautions No active isolations  Medications Medications  traMADol (ULTRAM) tablet 50 mg (has no administration in time range)  amiodarone (PACERONE) tablet 200 mg (200 mg Oral Not Given 06/28/19 2333)  ALPRAZolam (XANAX) tablet 0.5 mg (has  no administration in time range)  methimazole (TAPAZOLE) tablet 5 mg (has no administration in time range)  albuterol (VENTOLIN HFA) 108 (90 Base) MCG/ACT inhaler 2 puff (has no administration in time range)  acetaminophen (TYLENOL) tablet 650 mg (has no administration in time range)    Or  acetaminophen (TYLENOL) suppository 650 mg (has no administration in time range)  pantoprazole (PROTONIX) injection 40 mg (40 mg Intravenous Given 06/28/19 2251)  potassium chloride SA (KLOR-CON) CR tablet 40 mEq (has no administration in time range)  sodium chloride 0.9 % bolus 500 mL (0 mLs Intravenous Stopped 06/28/19 1947)  potassium chloride SA (KLOR-CON) CR tablet 20 mEq (20 mEq Oral Given 06/28/19 2017)  pantoprazole (PROTONIX) injection 40 mg (40 mg Intravenous Given 06/28/19 2020)  0.9 %  sodium chloride infusion (10 mL/hr Intravenous New Bag/Given 06/28/19 2243)  sodium chloride 0.9 % bolus 500 mL (500 mLs Intravenous New Bag/Given 06/29/19 0436)    Mobility walks with device High fall risk   Focused Assessments respiratory   R Recommendations: See Admitting Provider Note  Report given to:   Additional Notes:

## 2019-06-29 NOTE — Op Note (Signed)
[facility] Patient Name: Brianna Porter Procedure Date : 06/29/2019 MRN: [id number] Attending MD: Docia Chuck. Henrene Pastor , MD Date of Birth: [birth year] CSN: [id number] Age: 58 Admit Type: Inpatient Procedure:                Upper GI endoscopy with biopsies Indications:              [place] Providers:                Docia Chuck. Henrene Pastor, MD, Cleda Daub, RN, Cletis [place],                            Technician, Theodoro Grist, CRNA Referring MD:             [facility] hospitalist Medicines:                Monitored Anesthesia Care Complications:            No immediate complications. Estimated Blood Loss:     Estimated blood loss: none. Procedure:                Pre-Anesthesia Assessment:                           - Prior to the procedure, a History and Physical                            was performed, and patient medications and                            allergies were reviewed. The patient's tolerance of                            previous anesthesia was also reviewed. The risks                            and benefits of the procedure and the sedation                            options and risks were discussed with the patient.                            All questions were answered, and informed consent                            was obtained. Prior Anticoagulants: The patient has                            taken Xarelto (rivaroxaban), last dose was 2 days                            prior to procedure. ASA Grade Assessment: III - A                            patient with severe systemic disease. After  reviewing the risks and benefits, the patient was                            deemed in satisfactory condition to undergo the                            procedure.                           After obtaining informed consent, the endoscope was                            passed under direct vision. Throughout the                            procedure, the patient's blood  pressure, pulse, and                            oxygen saturations were monitored continuously. The                            [id number]) Olympus gastroscope was                            introduced through the mouth, and advanced to the                            second part of duodenum. The upper GI endoscopy was                            accomplished without difficulty. The patient                            tolerated the procedure well. Scope In: Scope Out: Findings:      The esophagus was normal.      Four non-bleeding superficial gastric ulcers with no stigmata of       bleeding were found in the gastric antrum. The largest lesion was 7 mm       in largest dimension. The others were 1 to 2 mm. Biopsies were taken       with a cold forceps for Helicobacter pylori testing.      The stomach was otherwise normal.      The examined duodenum was normal.      The cardia and gastric fundus were normal on retroflexion. Impression:               1. Nonbleeding gastric ulcers without stigmata, as                            described                           2. Otherwise normal EGD. Moderate Sedation:      none Recommendation:           1. Resume previous diet  2. Pantoprazole 40 mg twice daily for 4 weeks then                            40 mg daily INDEFINITELY                           3. Okay to resume anticoagulation                           4. Okay for discharge in a.m. from GI standpoint.                            Resume care with her primary providers. No                            outpatient GI follow-up required. Discussed with                            patient and provided her a copy of this report. We                            will sign off Procedure Code(s):        --- Professional ---                           [phone], Esophagogastroduodenoscopy, flexible,                            transoral; with biopsy, single or multiple Diagnosis  Code(s):        --- Professional ---                           K25.9, Gastric ulcer, unspecified as acute or                            chronic, without hemorrhage or perforation                           K92.1, [place] (includes Hematochezia) CPT copyright 2019 American Medical Association. All rights reserved. The codes documented in this report are preliminary and upon coder review may  be revised to meet current compliance requirements. Docia Chuck. Henrene Pastor, MD 06/29/2019 1:25:04 PM This report has been signed electronically. Number of Addenda: 0

## 2019-06-29 NOTE — Anesthesia Preprocedure Evaluation (Addendum)
Anesthesia Evaluation  Patient identified by MRN, date of birth, ID band Patient awake    Reviewed: Allergy & Precautions, NPO status , Patient's Chart, lab work & pertinent test results, reviewed documented beta blocker date and time   Airway Mallampati: III  TM Distance: >3 FB Neck ROM: Full    Dental no notable dental hx. (+) Poor Dentition   Pulmonary former smoker,    Pulmonary exam normal breath sounds clear to auscultation       Cardiovascular hypertension, Pt. on medications and Pt. on home beta blockers + CAD and +CHF (EF 20-25%)  Normal cardiovascular exam Rhythm:Regular Rate:Normal  Echo 04/29/2019  1. The left ventricle has severely reduced systolic function, with an ejection fraction of 20-25%. The cavity size was normal. Left ventrical global hypokinesis without regional wall motion abnormalities.  2. The right ventricle has severely reduced systolic function. The cavity was normal. There is no increase in right ventricular wall thickness. Right ventricular systolic pressure is normal.  3. No evidence of a thrombus present in the left atrial appendage.  4. Mild mitral insufficiency. The jet is centrally-directed.  5. The aortic root, ascending aorta, aortic arch and descending aorta are normal in size and structure.  6. No intracardiac thrombi or masses were visualized.   Neuro/Psych Depression    GI/Hepatic negative GI ROS, Neg liver ROS,   Endo/Other  diabetes, Type 2Hyperthyroidism   Renal/GU Renal diseaseCr 1.33 K+ 3.3     Musculoskeletal   Abdominal   Peds  Hematology  (+) anemia , Plt 533 Hgb 10.2   Anesthesia Other Findings   Reproductive/Obstetrics                             Lab Results  Component Value Date   WBC 8.0 06/29/2019   HGB 7.5 (L) 06/29/2019   HCT 23.8 (L) 06/29/2019   MCV 92.2 06/29/2019   PLT 298 06/29/2019   Lab Results  Component Value Date   CREATININE 1.86 (H) 06/29/2019   BUN 34 (H) 06/29/2019   NA 132 (L) 06/29/2019   K 3.3 (L) 06/29/2019   CL 99 06/29/2019   CO2 23 06/29/2019     Anesthesia Physical  Anesthesia Plan  ASA: IV  Anesthesia Plan: MAC   Post-op Pain Management:    Induction: Intravenous  PONV Risk Score and Plan: 3 and Treatment may vary due to age or medical condition, Ondansetron and Propofol infusion  Airway Management Planned: Mask and Natural Airway  Additional Equipment:   Intra-op Plan:   Post-operative Plan:   Informed Consent: I have reviewed the patients History and Physical, chart, labs and discussed the procedure including the risks, benefits and alternatives for the proposed anesthesia with the patient or authorized representative who has indicated his/her understanding and acceptance.     Dental advisory given  Plan Discussed with: CRNA  Anesthesia Plan Comments:         Anesthesia Quick Evaluation

## 2019-06-30 ENCOUNTER — Encounter (HOSPITAL_COMMUNITY): Payer: Self-pay | Admitting: Internal Medicine

## 2019-06-30 DIAGNOSIS — K253 Acute gastric ulcer without hemorrhage or perforation: Secondary | ICD-10-CM

## 2019-06-30 DIAGNOSIS — I4892 Unspecified atrial flutter: Secondary | ICD-10-CM

## 2019-06-30 DIAGNOSIS — K922 Gastrointestinal hemorrhage, unspecified: Secondary | ICD-10-CM

## 2019-06-30 DIAGNOSIS — T39395A Adverse effect of other nonsteroidal anti-inflammatory drugs [NSAID], initial encounter: Secondary | ICD-10-CM

## 2019-06-30 DIAGNOSIS — T45515A Adverse effect of anticoagulants, initial encounter: Secondary | ICD-10-CM

## 2019-06-30 LAB — CBC
HCT: 23.8 % — ABNORMAL LOW (ref 36.0–46.0)
Hemoglobin: 7.6 g/dL — ABNORMAL LOW (ref 12.0–15.0)
MCH: 29.8 pg (ref 26.0–34.0)
MCHC: 31.9 g/dL (ref 30.0–36.0)
MCV: 93.3 fL (ref 80.0–100.0)
Platelets: 286 10*3/uL (ref 150–400)
RBC: 2.55 MIL/uL — ABNORMAL LOW (ref 3.87–5.11)
RDW: 20.1 % — ABNORMAL HIGH (ref 11.5–15.5)
WBC: 6 10*3/uL (ref 4.0–10.5)
nRBC: 0 % (ref 0.0–0.2)

## 2019-06-30 LAB — HEPATIC FUNCTION PANEL
ALT: 84 U/L — ABNORMAL HIGH (ref 0–44)
ALT: 84 U/L — ABNORMAL HIGH (ref 0–44)
AST: 65 U/L — ABNORMAL HIGH (ref 15–41)
AST: 56 U/L — ABNORMAL HIGH (ref 15–41)
Albumin: 2.3 g/dL — ABNORMAL LOW (ref 3.5–5.0)
Albumin: 2.4 g/dL — ABNORMAL LOW (ref 3.5–5.0)
Alkaline Phosphatase: 102 U/L (ref 38–126)
Alkaline Phosphatase: 103 U/L (ref 38–126)
Bilirubin, Direct: 0.2 mg/dL (ref 0.0–0.2)
Bilirubin, Direct: <0.1 mg/dL (ref 0.0–0.2)
Indirect Bilirubin: 0.3 mg/dL (ref 0.3–0.9)
Total Bilirubin: 0.5 mg/dL (ref 0.3–1.2)
Total Bilirubin: 0.5 mg/dL (ref 0.3–1.2)
Total Protein: 4.4 g/dL — ABNORMAL LOW (ref 6.5–8.1)
Total Protein: 4.5 g/dL — ABNORMAL LOW (ref 6.5–8.1)

## 2019-06-30 LAB — BASIC METABOLIC PANEL
Anion gap: 8 (ref 5–15)
BUN: 25 mg/dL — ABNORMAL HIGH (ref 6–20)
CO2: 22 mmol/L (ref 22–32)
Calcium: 8.5 mg/dL — ABNORMAL LOW (ref 8.9–10.3)
Chloride: 102 mmol/L (ref 98–111)
Creatinine, Ser: 1.41 mg/dL — ABNORMAL HIGH (ref 0.44–1.00)
GFR calc Af Amer: 47 mL/min — ABNORMAL LOW (ref >60)
GFR calc non Af Amer: 41 mL/min — ABNORMAL LOW (ref >60)
Glucose, Bld: 117 mg/dL — ABNORMAL HIGH (ref 70–99)
Potassium: 5.1 mmol/L (ref 3.5–5.1)
Sodium: 132 mmol/L — ABNORMAL LOW (ref 135–145)

## 2019-06-30 LAB — UREA NITROGEN, URINE: Urea Nitrogen, Ur: 284 mg/dL

## 2019-06-30 LAB — HEPATITIS C ANTIBODY (REFLEX): HCV Ab: <0.1 s/co ratio (ref 0.0–0.9)

## 2019-06-30 LAB — SURGICAL PATHOLOGY

## 2019-06-30 LAB — HCV COMMENT:

## 2019-06-30 LAB — GLUCOSE, CAPILLARY
Glucose-Capillary: 95 mg/dL (ref 70–99)
Glucose-Capillary: 138 mg/dL — ABNORMAL HIGH (ref 70–99)

## 2019-06-30 MED ORDER — PANTOPRAZOLE SODIUM 40 MG PO TBEC: 40.0000 mg | DELAYED_RELEASE_TABLET | Freq: Two times a day (BID) | ORAL | 0 refills | Status: DC

## 2019-06-30 MED ORDER — DULOXETINE HCL 60 MG PO CPEP
60.0000 mg | ORAL_CAPSULE | Freq: Two times a day (BID) | ORAL | Status: DC
  Filled 2019-06-30: qty 1

## 2019-06-30 MED ORDER — FUROSEMIDE 40 MG PO TABS: 40.0000 mg | ORAL_TABLET | Freq: Every day | ORAL | 0 refills | Status: DC

## 2019-06-30 MED ORDER — ACETAMINOPHEN 325 MG PO TABS: 650.0000 mg | ORAL_TABLET | Freq: Four times a day (QID) | ORAL | 0 refills | Status: DC | PRN

## 2019-06-30 MED ORDER — APIXABAN 5 MG PO TABS
5.0000 mg | ORAL_TABLET | Freq: Two times a day (BID) | ORAL | Status: DC
  Administered 2019-06-30: 5 mg via ORAL
  Filled 2019-06-30: qty 1

## 2019-06-30 MED ORDER — FUROSEMIDE 40 MG PO TABS
40.0000 mg | ORAL_TABLET | Freq: Every day | ORAL | Status: DC
  Administered 2019-06-30: 40 mg via ORAL
  Filled 2019-06-30: qty 1

## 2019-06-30 MED ORDER — APIXABAN 5 MG PO TABS: 5.0000 mg | ORAL_TABLET | Freq: Two times a day (BID) | ORAL | 0 refills | Status: DC

## 2019-06-30 MED FILL — FUROSEMIDE 40 MG TABLET: 40 | 30 days supply | Qty: 30 | Fill #0

## 2019-06-30 MED FILL — ELIQUIS 5 MG TABLET: 5 | 30 days supply | Qty: 60 | Fill #0

## 2019-06-30 MED FILL — PANTOPRAZOLE SOD DR 40 MG T: 40 | 30 days supply | Qty: 60 | Fill #0

## 2019-06-30 MED FILL — ACETAMINOPHEN 325 MG TABS: 325 | 4 days supply | Qty: 30 | Fill #0

## 2019-06-30 NOTE — Evaluation (Signed)
Occupational Therapy Evaluation Patient Details Name: Brianna Porter MRN: [id number] DOB: 1960/11/21 Today's Date: 06/30/2019    History of Present Illness Brianna Porter is a 58 y.o. female with a past medical history as listed below including CKD stage II, chronic systolic CHF (EF 0000000) and A. fib on Xarelto, who presented to the ER on 06/28/2019 for diarrhea and generalized weakness.  Upon arrival patient was found to have a hemoglobin of 6.5 with reports of melena and we were consulted.   Clinical Impression   PATIENT WAS SEEN FOR SKILLED OT FOR NEEDS. PATIENT IS HAVING PAIN IN FEET FROM EDEMA AND MD IS AWARE. PATIENT WAS ABLE TO PERFORM ADLS WITHOUT ASSIST. PATIENT IS ABLE OT AMB INTO BATHROOM WITHOUT ASSIST. PATIENT STATES SHE FEELS READY TO D/C. NO FURTHER OT NEEDS AT THIS TIME.     Follow Up Recommendations       Equipment Recommendations       Recommendations for Other Services       Precautions / Restrictions Precautions Precautions: Fall      Mobility Bed Mobility Overal bed mobility: Modified Independent                Transfers Overall transfer level: Modified independent                    Balance                                           ADL either performed or assessed with clinical judgement   ADL Overall ADL's : At baseline                                       General ADL Comments: patient was able to perform dressing task.     Vision Baseline Vision/History: Wears glasses Wears Glasses: At all times       Perception     Praxis      Pertinent Vitals/Pain Pain Assessment: 0-10 Pain Score: 10-Worst pain ever Pain Location: b feet swollen Pain Intervention(s): Patient requesting pain meds-RN notified;Limited activity within patient's tolerance     Hand Dominance     Extremity/Trunk Assessment Upper Extremity Assessment Upper Extremity Assessment: LUE deficits/detail LUE Deficits /  Details: l shld flex 30 and aarom is wnl. patient reports that she fell on it and it hurts.           Communication Communication Communication: No difficulties   Cognition Arousal/Alertness: Awake/alert Behavior During Therapy: WFL for tasks assessed/performed Overall Cognitive Status: Within Functional Limits for tasks assessed                                     General Comments       Exercises     Shoulder Instructions      Home Living Family/patient expects to be discharged to:: Private residence Living Arrangements: Children;Non-relatives/Friends Available Help at Discharge: Family;Friend(s);Available 24 hours/day Type of Home: House Home Access: Stairs to enter [employer] of Steps: 2 Entrance Stairs-Rails: Can reach both Home Layout: Two level Alternate Level Stairs-Number of Steps: Patient has stair lift   Bathroom Shower/Tub: Tub/shower unit;Walk-in Psychologist, prison and probation services: Standard     Home Equipment: Grab  bars - tub/shower;Shower seat;Cane - single point          Prior Functioning/Environment Level of Independence: Independent with assistive device(s)                 OT Problem List:        OT Treatment/Interventions:      OT Goals(Current goals can be found in the care plan section)    OT Frequency:     Barriers to D/C:            Co-evaluation              AM-PAC OT "6 Clicks" Daily Activity     Outcome Measure Help from another person eating meals?: None Help from another person taking care of personal grooming?: None Help from another person toileting, which includes using toliet, bedpan, or urinal?: None Help from another person bathing (including washing, rinsing, drying)?: None Help from another person to put on and taking off regular upper body clothing?: None Help from another person to put on and taking off regular lower body clothing?: None 6 Click Score: 24   End of Session Nurse  Communication: (ok therapy)  Activity Tolerance: Patient tolerated treatment well;Patient limited by pain Patient left: in bed                   Time: [id number] OT Time Calculation (min): 28 min Charges:  OT General Charges $OT Visit: 1 Visit OT Evaluation $OT Eval Low Complexity: 1 Low  6 CLICKS  Abhijot Straughter 06/30/2019, 12:16 PM

## 2019-06-30 NOTE — Discharge Instructions (Addendum)
It was a pleasure taking care of you while you are in the hospital!  While here you were treated for a gastrointestinal bleed and a kidney injury, you received a blood transfusion, and had an upper endoscopy.  1.  On endoscopy, 4 ulcers were found and they had all stopped bleeding. Please take Protonix 40 mg twice a day for the next 4 weeks, and then on 07/27/2019 take protonix 40 mg daily thereafter. This is to protect your stomach lining and promote healing of the ulcers.  2.  Please do not use ibuprofen or other NSAIDs such as Motrin or Advil as these contributed to both the ulcers and your kidney injury.  3.  Because you have had a GI bleed while on Xarelto, we suggest you switch back to Eliquis, which has been prescribed for you.  You can see more information below.  Please only take Eliquis (aka apixaban), do not take both anticoagulants.  4. You have slightly elevated potassium today, please follow up with your cardiologist or primary care provider within the next week to check your potassium and to restart your heart failure medications (entresto, spironolactone) when appropriate.  5. If you have dizziness, signs of a new GI bleed like tarry black stools, lightheadedness, fainting, please go to your nearest emergency room for evaluation.  Be Well!  Dr. Chauncey Reading  Information on my medicine - ELIQUIS (apixaban)  This medication education was reviewed with me or my healthcare representative as part of my discharge preparation.  The pharmacist that spoke with me during my hospital stay was:  [facility], Margot Chimes, [facility]  Why was Eliquis prescribed for you? Eliquis was prescribed for you to reduce the risk of a blood clot forming that can cause a stroke if you have a medical condition called atrial fibrillation (a type of irregular heartbeat).  What do You need to know about Eliquis ? Take your Eliquis TWICE DAILY - one tablet in the morning and one tablet in the evening with or  without food. If you have difficulty swallowing the tablet whole please discuss with your pharmacist how to take the medication safely. DO NOT TAKE XARELTO.  Take Eliquis exactly as prescribed by your doctor and DO NOT stop taking Eliquis without talking to the doctor who prescribed the medication.  Stopping may increase your risk of developing a stroke.  Refill your prescription before you run out.  After discharge, you should have regular check-up appointments with your healthcare provider that is prescribing your Eliquis.  In the future your dose may need to be changed if your kidney function or weight changes by a significant amount or as you get older.  What do you do if you miss a dose? If you miss a dose, take it as soon as you remember on the same day and resume taking twice daily.  Do not take more than one dose of ELIQUIS at the same time to make up a missed dose.  Important Safety Information A possible side effect of Eliquis is bleeding. You should call your healthcare provider right away if you experience any of the following: ? Bleeding from an injury or your nose that does not stop. ? Unusual colored urine (red or dark brown) or unusual colored stools (red or black). ? Unusual bruising for unknown reasons. ? A serious fall or if you hit your head (even if there is no bleeding).  Some medicines may interact with Eliquis and might increase your risk of bleeding or clotting while  on Eliquis. To help avoid this, consult your healthcare provider or pharmacist prior to using any new prescription or non-prescription medications, including herbals, vitamins, non-steroidal anti-inflammatory drugs (NSAIDs) and supplements.  This website has more information on Eliquis (apixaban): http://www.eliquis.com/eliquis/home

## 2019-06-30 NOTE — Discharge Summary (Addendum)
[facility] Discharge Summary  Patient name: Brianna Porter Medical record number: [id number] Date of birth: Jul 27, 1961 Age: 58 y.o. Gender: female Date of Admission: 06/28/2019  Date of Discharge: 06/30/2019 Admitting Physician: Kathrene Alu, MD  Primary Care Provider: Cher Nakai, MD Consultants: GI  Indication for Hospitalization: symptomatic anemia  Discharge Diagnoses/Problem List:  HFrEF Paroxysmal Atrial Fibrillation Hyperthyroidism OA Anxiety  Disposition: to home  Discharge Condition: stable and improved  Discharge Exam:  General: pleasant woman able to participate in exam, NAD Cardiovascular: RRR, no m/r/g, no JVP Respiratory: CTAB Abdomen: soft, NT, ND, normal bowel sounds present Extremities: 2+ bilateral LE edema to knees  Brief Hospital Course:  [place] was admitted on 06/28/2019 for symptomatic anemia with concern for GI bleed with hemoglobin of 6.5 g/dL. Patient received 1 unit of packed red blood cells, subsequent hemoglobin stabilized and remained at 7.5 g/dL. GI was consulted and an upper GI endoscopy revealed 4 gastric, non-bleeding ulcers. GI recommended PPI treatment twice daily for 4 weeks, followed by daily treatment indefinitely thereafter. In history taking, patient stated that she uses NSAIDs on a daily basis, and this likely provoked the GI bleed. Patient was educated on not using NSAIDs. This is the second GI bleeding event while this patient has been on xarelto. Upon counseling, xarelto was discontinued and apixaban restarted prior to discharge.   Patient had an intrinsic AKI (FeNa 2.9%) likely due to dehydration, anemia, and NSAID use. Creatinine was 2.2 on day of admission. Patient was treated conservatively during admission, and Creatinine had improved to 1.41 on day of discharge. Lasix restarted at home dose of 40 mg by mouth daily. Recommend monitoring of kidney function while using lasix.  Patient had  elevated LFTs at admission AST 130, ALT 137, which improved to AST 56, ALT 84 by discharge. Possibly due to dehydration, hypotension, anemia, and use of both ibuprofen and amiodarone. Recommend monitoring of LFTs.  Issues for Follow Up:  1. HFrEF management- recommended patient see her cardiologist/PCP within 1 week from discharge to check potassium level. Patient had potassium of 5.1 on day of discharge and entresto, spironolactone were not restarted prior to discharge. 2. Pantoprazole 40 mg BID x 4 weeks (end 07/27/2019), followed by 40 mg daily indefinitely thereafter. 3. Apixaban started due to bleeds on xarelto. Patient reports that she switched to xarelto due to lower extremity pruritus on apixaban. Recommend follow up for side effects. 4. Patient had resolving intrinsic AKI, likely due to dehydration and NSAID use. Lasix was restarted at home dose. Recommend following creatinine for complete resolution of kidney injury while using diuretic. 5. Recommend [facility] follow up for medication usage as patient has been confused in the past in regard to medication regimen. 6. LFTs improving, Heb B s ag non reactive, Hep C ab non reactive. Recommend monitoring in setting of amiodarone use.  Significant Procedures: EGD  Significant Labs and Imaging:  Recent Labs  Lab 06/29/19 0258 06/29/19 0437 06/30/19 0546  WBC 8.6 8.0 6.0  HGB 7.6* 7.5* 7.6*  HCT 24.7* 23.8* 23.8*  PLT 317 298 286   Recent Labs  Lab 06/28/19 1830 06/29/19 0258 06/30/19 0546 06/30/19 1217  NA 130* 132* 132*  --   K 3.3* 3.3* 5.1  --   CL 93* 99 102  --   CO2 22 23 22   --   GLUCOSE 60* 91 117*  --   BUN 40* 34* 25*  --   CREATININE 2.15* 1.86* 1.41*  --  CALCIUM 8.5* 8.1* 8.5*  --   MG 2.1  --   --   --   ALKPHOS 106 95 102 103  AST 130* 108* 65* 56*  ALT 137* 110* 84* 84*  ALBUMIN 3.0* 2.4* 2.3* 2.4*    Results/Tests Pending at Time of Discharge: none  Discharge Medications:  Allergies as of 06/30/2019       Reactions   Ambien [zolpidem Tartrate]    Pt and family state this med makes the patient sleepwalk.      Medication List    STOP taking these medications   doxycycline 100 MG tablet Commonly known as: VIBRA-TABS   Entresto 24-26 MG Generic drug: sacubitril-valsartan   ibuprofen 200 MG tablet Commonly known as: ADVIL   potassium chloride SA 20 MEQ tablet Commonly known as: KLOR-CON   rivaroxaban 20 MG Tabs tablet Commonly known as: Xarelto   spironolactone 25 MG tablet Commonly known as: ALDACTONE     TAKE these medications   acetaminophen 325 MG tablet Commonly known as: TYLENOL Take 2 tablets (650 mg total) by mouth every 6 (six) hours as needed for mild pain (or Fever >/= 101).   albuterol 108 (90 Base) MCG/ACT inhaler Commonly known as: VENTOLIN HFA Inhale 2 puffs into the lungs every 4 (four) hours as needed for wheezing or shortness of breath.   ALPRAZolam 0.5 MG tablet Commonly known as: XANAX Take 0.5 mg by mouth See admin instructions. Take one tablet (0.5 mg) by mouth twice daily - mid-day and bedtime   amiodarone 200 MG tablet Commonly known as: PACERONE Take 200 mg by mouth 2 (two) times daily.   apixaban 5 MG Tabs tablet Commonly known as: ELIQUIS Take 1 tablet (5 mg total) by mouth 2 (two) times daily.   cetirizine 10 MG tablet Commonly known as: ZYRTEC Take 10 mg by mouth daily as needed for allergies.   cholecalciferol 25 MCG (1000 UT) tablet Commonly known as: VITAMIN D3 Take 1,000 Units by mouth daily.   DULoxetine 60 MG capsule Commonly known as: CYMBALTA Take 60 mg by mouth 2 (two) times daily.   fenofibrate 48 MG tablet Commonly known as: TRICOR Take 48 mg by mouth at bedtime.   furosemide 40 MG tablet Commonly known as: LASIX Take 1 tablet (40 mg total) by mouth daily.   ICY HOT ADVANCED RELIEF EX Apply 1 application topically 3 (three) times daily as needed (pain).   methimazole 5 MG tablet Commonly known as: TAPAZOLE Take  5 mg by mouth at bedtime.   metoprolol succinate 100 MG 24 hr tablet Commonly known as: TOPROL-XL Take 50 mg by mouth 2 (two) times daily.   pantoprazole 40 MG tablet Commonly known as: Protonix Take 1 tablet (40 mg total) by mouth 2 (two) times daily.   traMADol 50 MG tablet Commonly known as: ULTRAM Take 50 mg by mouth 4 (four) times daily as needed (pain).   vitamin C 1000 MG tablet Take 1,000 mg by mouth daily.       Discharge Instructions: Please refer to Patient Instructions section of EMR for full details.  Patient was counseled important signs and symptoms that should prompt return to medical care, changes in medications, dietary instructions, activity restrictions, and follow up appointments.   Follow-Up Appointments: Follow-up Information    Cher Nakai, MD. Schedule an appointment as soon as possible for a visit.   Specialty: Internal Medicine Why: make appt within 1 week of discharge for medication management Contact information: [address] [place] [address] [phone]        Richardo Priest, MD .   Specialty: Cardiology Contact information: [place] [address] [phone]           [name], MD 07/01/2019, 3:47 AM PGY-1, [facility] ------------------------------------------------------------------------------------------ Upper Level Addendum: I have evaluated this patient along with Dr. Chauncey Reading and reviewed the above note, making necessary revisions in blue.  Guadalupe Dawn MD PGY-3 Family Medicine Resident

## 2019-06-30 NOTE — Progress Notes (Signed)
ANTICOAGULATION CONSULT NOTE - Initial Consult  Pharmacy Consult for apixaban Indication: atrial fibrillation  Allergies  Allergen Reactions  . Ambien [Zolpidem Tartrate]     Pt and family state this med makes the patient sleepwalk.    Patient Measurements: Height: 5\' 3"  (160 cm) Weight: 136 lb 11.2 oz (62 kg) IBW/kg (Calculated) : 52.4  Vital Signs: Temp: 98 F (36.7 C) (11/10 0638) Temp Source: Oral (11/10 [id number]) BP: 123/61 (11/10 0953) Pulse Rate: 84 (11/10 0638)  Labs: Recent Labs    06/28/19 1830 06/29/19 0258 06/29/19 0437 06/30/19 0546  HGB 6.5* 7.6* 7.5* 7.6*  HCT 21.0* 24.7* 23.8* 23.8*  PLT 410* 317 298 286  CREATININE 2.15* 1.86*  --  1.41*    Estimated Creatinine Clearance: 36 mL/min (A) (by C-G formula based on SCr of 1.41 mg/dL (H)).   Medical History: Past Medical History:  Diagnosis Date  . Anxiety and depression   . Atrial flutter ([place])   . Chronic systolic CHF (congestive heart failure) (HCC)    a. dx in setting of atrial fib/flutter - possibly tachy mediated. Coronary CTA with only mild CAD in 04/2019.  Marland Kitchen CKD (chronic kidney disease), stage II   . Confusion    a. persistent confusion during 04/2019 admission of unclear cause. Home meds adjusted. CT/MRI brain nonacute.  . Diabetes ([place])   . Edema   . Elevated liver function tests   . Hypercholesteremia   . Hyperkalemia   . Hypertension   . Hypertensive heart and chronic kidney disease with systolic congestive heart failure ([place])   . Hyperthyroidism   . Hypokalemia   . Hypokalemia   . Hypomagnesemia   . Hyponatremia   . Iron deficiency anemia   . Mild CAD    a. Coronary CT 04/2019 - Minimal, Non-obstructive CAD.  Marland Kitchen NICM (nonischemic cardiomyopathy) ([place])   . Persistent atrial fibrillation ([place])   . Prolonged QT interval     Medications:  Medications Prior to Admission  Medication Sig Dispense Refill Last Dose  . albuterol (VENTOLIN HFA) 108 (90 Base) MCG/ACT inhaler Inhale 2 puffs  into the lungs every 4 (four) hours as needed for wheezing or shortness of breath.    06/27/2019 at Unknown time  . ALPRAZolam (XANAX) 0.5 MG tablet Take 0.5 mg by mouth See admin instructions. Take one tablet (0.5 mg) by mouth twice daily - mid-day and bedtime   06/27/2019 at pm  . amiodarone (PACERONE) 200 MG tablet Take 200 mg by mouth 2 (two) times daily.   06/28/2019 at am  . Ascorbic Acid (VITAMIN C) 1000 MG tablet Take 1,000 mg by mouth daily.   06/27/2019 at Unknown time  . cetirizine (ZYRTEC) 10 MG tablet Take 10 mg by mouth daily as needed for allergies.    week ago  . cholecalciferol (VITAMIN D3) 25 MCG (1000 UT) tablet Take 1,000 Units by mouth daily.   06/28/2019 at am  . DULoxetine (CYMBALTA) 60 MG capsule Take 60 mg by mouth 2 (two) times daily.   06/28/2019 at am  . fenofibrate (TRICOR) 48 MG tablet Take 48 mg by mouth at bedtime.   06/27/2019 at pm  . furosemide (LASIX) 40 MG tablet Take 1 tablet (40 mg total) by mouth 2 (two) times daily. (Patient taking differently: Take 40 mg by mouth daily. ) 60 tablet 3 06/28/2019 at am  . ibuprofen (ADVIL) 200 MG tablet Take 200-400 mg by mouth 2 (two) times daily as needed for headache (pain).   06/28/2019 at  am  . Menthol, Topical Analgesic, (ICY HOT ADVANCED RELIEF EX) Apply 1 application topically 3 (three) times daily as needed (pain).    06/27/2019 at pm  . methimazole (TAPAZOLE) 5 MG tablet Take 5 mg by mouth at bedtime.    06/27/2019 at pm  . metoprolol succinate (TOPROL-XL) 100 MG 24 hr tablet Take 50 mg by mouth 2 (two) times daily.    06/28/2019 at 1000  . rivaroxaban (XARELTO) 20 MG TABS tablet Take 1 tablet (20 mg total) by mouth daily with supper. (Patient taking differently: Take 20 mg by mouth daily after supper. ) 30 tablet 6 06/27/2019 at 2200  . traMADol (ULTRAM) 50 MG tablet Take 50 mg by mouth 4 (four) times daily as needed (pain).    06/28/2019 at am  . doxycycline (VIBRA-TABS) 100 MG tablet Take 100 mg by mouth 2 (two) times daily.    11/3 or 11/4  . potassium chloride SA (KLOR-CON) 20 MEQ tablet Take 20 mEq by mouth daily.   06/13/2019 at am  . sacubitril-valsartan (ENTRESTO) 24-26 MG Take 1 tablet by mouth 2 (two) times daily. 60 tablet 2 not yet taken  . spironolactone (ALDACTONE) 25 MG tablet Take 0.5 tablets (12.5 mg total) by mouth daily. 15 tablet 6 06/13/2019 at am    Assessment: 58 y/o female admitted with diarrhea and generalized weakness found to have a GIB. She was taking Xarelto PTA for hx Afib and using NSAIDs. Upper endoscopy found non-bleeding gastric ulcers and GI has approved resuming anticoagulation. Pharmacy consulted to switch anticoagulant to apixaban.   AKI noted and SCr trending down. No further bleeding noted, Hgb stable in 7s, platelets are normal. Full dose apixaban appropriate for age <80, weight > 60 kg, and SCr <1.5.  Plan:  Apixaban 5 mg PO bid  Monitor for s/sx of bleeding Education prior to discharge  Thank you for involving pharmacy in this patient's care.  Renold Genta, PharmD, BCPS Clinical Pharmacist Clinical phone for 06/30/2019 until 3p is [phone] 06/30/2019 1:31 PM  **Pharmacist phone directory can be found on [email or web address] listed under [place]**

## 2019-06-30 NOTE — Evaluation (Signed)
Physical Therapy Evaluation Patient Details Name: Brianna Porter MRN: [id number] DOB: 1960/10/01 Today's Date: 06/30/2019   History of Present Illness  Pt adm with Hgb 6.5 and melena. Pt with GI bleed. PMH - ckd, chf, afib  Clinical Impression  Pt doing well with mobility and no further PT needed.  Ready for dc from PT standpoint.      Follow Up Recommendations No PT follow up    Equipment Recommendations  None recommended by PT    Recommendations for Other Services       Precautions / Restrictions Precautions Precautions: None      Mobility  Bed Mobility Overal bed mobility: Modified Independent                Transfers Overall transfer level: Modified independent Equipment used: None                Ambulation/Gait Ambulation/Gait assistance: Modified independent (Device/Increase time) Gait Distance (Feet): 90 Feet Assistive device: None Gait Pattern/deviations: Decreased stance time - left;Antalgic Gait velocity: decr Gait velocity interpretation: 1.31 - 2.62 ft/sec, indicative of limited community ambulator General Gait Details: Steady gait that is hindered by lt foot pain   Stairs            Wheelchair Mobility    Modified Rankin (Stroke Patients Only)       Balance                                             Pertinent Vitals/Pain Pain Assessment: Faces Pain Score: 10-Worst pain ever Faces Pain Scale: Hurts even more Pain Location: feet lt > rt Pain Descriptors / Indicators: Grimacing;Guarding Pain Intervention(s): Limited activity within patient's tolerance;Repositioned;Monitored during session;Other (comment)(applied foam dressing to lt heel)    Home Living Family/patient expects to be discharged to:: Private residence Living Arrangements: Children;Non-relatives/Friends Available Help at Discharge: Family;Friend(s);Available 24 hours/day Type of Home: House Home Access: Stairs to enter Entrance  Stairs-Rails: Can reach both Entrance Stairs-Number of Steps: 2 Home Layout: Two level Home Equipment: Grab bars - tub/shower;Shower seat;Cane - single point      Prior Function Level of Independence: Independent               Hand Dominance        Extremity/Trunk Assessment   Upper Extremity Assessment Upper Extremity Assessment: Defer to OT evaluation LUE Deficits / Details: l shld flex 30 and aarom is wnl. patient reports that she fell on it and it hurts.    Lower Extremity Assessment Lower Extremity Assessment: Overall WFL for tasks assessed       Communication   Communication: No difficulties  Cognition Arousal/Alertness: Awake/alert Behavior During Therapy: WFL for tasks assessed/performed Overall Cognitive Status: Within Functional Limits for tasks assessed                                        General Comments      Exercises     Assessment/Plan    PT Assessment Patent does not need any further PT services  PT Problem List         PT Treatment Interventions      PT Goals (Current goals can be found in the Care Plan section)  Acute Rehab PT Goals PT Goal Formulation: All assessment and  education complete, DC therapy    Frequency     Barriers to discharge        Co-evaluation               AM-PAC PT "6 Clicks" Mobility  Outcome Measure Help needed turning from your back to your side while in a flat bed without using bedrails?: None Help needed moving from lying on your back to sitting on the side of a flat bed without using bedrails?: None Help needed moving to and from a bed to a chair (including a wheelchair)?: None Help needed standing up from a chair using your arms (e.g., wheelchair or bedside chair)?: None Help needed to walk in hospital room?: None Help needed climbing 3-5 steps with a railing? : None 6 Click Score: 24    End of Session   Activity Tolerance: Patient limited by pain Patient left: in  bed;with call bell/phone within reach   PT Visit Diagnosis: Other abnormalities of gait and mobility (R26.89)    Time: 1255-1306 PT Time Calculation (min) (ACUTE ONLY): 11 min   Charges:   PT Evaluation $PT Eval Low Complexity: [place] Pager [phone] Office [place] 06/30/2019, 1:18 PM

## 2019-06-30 NOTE — Progress Notes (Signed)
Family Medicine Teaching Service Daily Progress Note Intern Pager: [phone]  Patient name: Brianna Porter Medical record number: [id number] Date of birth: 1961-01-10 Age: 58 y.o. Gender: female  Primary Care Provider: Cher Nakai, MD Consultants: GI Code Status: FULL  Pt Overview and Major Events to Date:  11/9 Admitted for GIB, s/p 1U pRBCs, EGD with gastric ulcers 11/10 Hgb stable  Assessment and Plan: Brianna Porter is a 58 y.o. female presenting with diarrhea and generalized weakness. PMH is significant for HFrEF with EF 20-25%, paroxysmal atrial fibrillation on amiodarone and Xarelto, hyperthyroidism, osteoarthritis, and anxiety.  Symptomatic anemia due to GI bleed likely secondary to NSAID use Hemoglobin holding steady this AM at 7.6. Pt is feeling better and is looking forward to going home, likely later today. Will restart anticoagulation today, but switch to apixaban due to better bleeding profile, as patient has had GIBs while on xarelto. -Start apixaban per pharmacy consult -NO NSAIDs, patient counseled -GI recs: Pantoprazole 40 mg BID x 4 weeks, then daily indefinitely thereafter  Elevated liver enzymes- improving On admission transaminases elevated to AST 130, ALT 137. Previously ALT 63, AST 41. Today AST 56, ALT 84, improved.  Possible causes of elevated liver enzymes include amiodarone, ibuprofen. Hep C ab non reactive. -NO NSAIDs - Follow up as outpatient, will inform op cardiologist of elevated LFTs  AKI- resolved Creatinine 2.15 at admission improved to 1.41 this AM.  Baseline 0.77-0.92(04/2019).  FeNa 2.9% supportive of intrinsic cause of AKI, likely ATN related to dehydration and NSAID use as previously supposed. -avoid nephrotoxic agents  HFrEF TEE 04/2019 LVEF 20-25%. Patient reports dry weight 130 pounds.  On exam lung sounds clear to auscultation.  No JVD appreciated.  Bilateral lower extremity 2+ edema  up to knee. Wt today 136.7 lbs, down 2 pounds today.  Patient previously on spironolactone, losartan was d/c and entresto prescribed, but pt had not yet started entresto at admission. Due to K at 5.1 today, will hold off on restarting HF medications until K improved. Will restart lasix at home dose of 40 mg qd. -weigh daily -strict intake and output -F/U as an outpatient in next week to begin HF medications (entresto, spironolactone) when K appropriate  Hypokalemia- resolved Potassium 5.1 this morning, s/p repletion from 3.3 at admission.  - BMP in AM  Paroxysmal A. Fib with H/O Cardioversion EKG shows sinus rhythm.  Home medication Xarelto 20 mg daily, metoprolol 50 mg twice daily and Amiodarone 200 mg twice daily.  Patient was taking Eliquis but was recently switched to Xarelto due to itching, however pt had GIBs due to xarelto. Will restart eliquis (apixaban). -start apixaban per pharmacy consult -Continue amiodarone 200 mg twice daily, hold if MAP<60  Hyperthyroidism Asymptomatic.  Home medication methimazole 5 mg nightly -Continue methimazole at night  Osteoarthritis Patient has been taking tylenol, ibuprofen 400 mg daily long term, and tramadol 50 mg four times daily PRN. -continue tylenol 650 mg Q6H PRN and tramadol 50 mg Q12H PRN for pain     - AVOID NSAIDs, patient educated  Anxiety Patient reports panic attacks and generalized anxiety and takes Xanax 0.5 mg BID as well as cymbalta 60 mg BID at home. -continue Xanax -restart cymbalta now that AKI is improved  FEN/GI: heart healthy diet  Prophylaxis: apixaban  Disposition: possibly home later today pending CMP  Subjective:  Patient is feeling much better and looking forward to going home soon.  Objective: Temp:  [97.8 F (36.6 C)-98.9 F (37.2 C)] 98 F (36.7 C) (  11/10 [id number]) Pulse Rate:  [65-84] 84 (11/10 0638) Resp:  [13-19] 14 (11/09 2125) BP: (112-170)/(54-83) 112/69 (11/10 0638) SpO2:  [98 %-100 %] 98 % (11/10 [id number]) Weight:  [62.9 kg] 62.9 kg (11/09  0643) Physical Exam: General: pale, tired woman able to participate in exam Cardiovascular: RRR, no m/r/g, no JVP Respiratory: CTAB Abdomen: soft, NT, ND Extremities: 2+ bilateral LE edema to knees  Laboratory: Recent Labs  Lab 06/29/19 0258 06/29/19 0437 06/30/19 0546  WBC 8.6 8.0 6.0  HGB 7.6* 7.5* 7.6*  HCT 24.7* 23.8* 23.8*  PLT 317 298 286   Recent Labs  Lab 06/28/19 1830 06/29/19 0258 06/30/19 0546  NA 130* 132* 132*  K 3.3* 3.3* 5.1  CL 93* 99 102  CO2 22 23 22   BUN 40* 34* 25*  CREATININE 2.15* 1.86* 1.41*  CALCIUM 8.5* 8.1* 8.5*  PROT 5.2* 4.4*  --   BILITOT 0.6 0.2*  --   ALKPHOS 106 95  --   ALT 137* 110*  --   AST 130* 108*  --   GLUCOSE 60* 91 117*    Imaging/Diagnostic Tests:  Upper GI Endoscopy and biopsies: Four non-bleeding superficial gastric ulcers with no stigmata of bleeding were found in the gastric antrum. The largest lesion was 7 mm in largest dimension. The others were 1 to 2 mm. Biopsies were taken with a cold forceps for Helicobacter pylori testing.  [name], MD 06/30/2019, 6:39 AM PGY-1, [place] Intern pager: [phone], text pages welcome

## 2019-06-30 NOTE — Anesthesia Postprocedure Evaluation (Signed)
Anesthesia Post Note  Patient: Brianna Porter  Procedure(s) Performed: ESOPHAGOGASTRODUODENOSCOPY (EGD) WITH PROPOFOL (N/A ) BIOPSY     Patient location during evaluation: PACU Anesthesia Type: MAC Level of consciousness: awake and alert Pain management: pain level controlled Vital Signs Assessment: post-procedure vital signs reviewed and stable Respiratory status: spontaneous breathing Cardiovascular status: stable Anesthetic complications: no    Last Vitals:  Vitals:   06/29/19 2125 06/30/19 0638  BP: (!) 141/64 112/69  Pulse: 72 84  Resp: 14   Temp: 36.6 C 36.7 C  SpO2: 100% 98%    Last Pain:  Vitals:   06/30/19 2130  TempSrc: Oral  PainSc:                  Nolon Nations

## 2019-06-30 NOTE — Plan of Care (Signed)
  Problem: Education: Goal: Knowledge of General Education information will improve Description: Including pain rating scale, medication(s)/side effects and non-pharmacologic comfort measures 06/30/2019 1022 by Lovie Chol, RN Outcome: Adequate for Discharge 06/30/2019 1022 by Lovie Chol, RN Outcome: Progressing

## 2019-06-30 NOTE — Plan of Care (Signed)
  Problem: Education: Goal: Knowledge of General Education information will improve Description Including pain rating scale, medication(s)/side effects and non-pharmacologic comfort measures Outcome: Progressing   

## 2019-07-01 DIAGNOSIS — J309 Allergic rhinitis, unspecified: Secondary | ICD-10-CM | POA: Diagnosis not present

## 2019-07-01 DIAGNOSIS — Z79899 Other long term (current) drug therapy: Secondary | ICD-10-CM | POA: Diagnosis not present

## 2019-07-01 DIAGNOSIS — I10 Essential (primary) hypertension: Secondary | ICD-10-CM | POA: Diagnosis not present

## 2019-07-01 DIAGNOSIS — R0600 Dyspnea, unspecified: Secondary | ICD-10-CM | POA: Diagnosis not present

## 2019-07-01 DIAGNOSIS — E119 Type 2 diabetes mellitus without complications: Secondary | ICD-10-CM | POA: Diagnosis not present

## 2019-07-01 DIAGNOSIS — I482 Chronic atrial fibrillation, unspecified: Secondary | ICD-10-CM | POA: Diagnosis not present

## 2019-07-01 DIAGNOSIS — R7989 Other specified abnormal findings of blood chemistry: Secondary | ICD-10-CM | POA: Diagnosis not present

## 2019-07-01 DIAGNOSIS — H5213 Myopia, bilateral: Secondary | ICD-10-CM | POA: Diagnosis not present

## 2019-07-01 DIAGNOSIS — R609 Edema, unspecified: Secondary | ICD-10-CM | POA: Diagnosis not present

## 2019-07-01 DIAGNOSIS — K922 Gastrointestinal hemorrhage, unspecified: Secondary | ICD-10-CM | POA: Diagnosis not present

## 2019-07-01 DIAGNOSIS — R809 Proteinuria, unspecified: Secondary | ICD-10-CM | POA: Diagnosis not present

## 2019-07-01 DIAGNOSIS — K279 Peptic ulcer, site unspecified, unspecified as acute or chronic, without hemorrhage or perforation: Secondary | ICD-10-CM | POA: Diagnosis not present

## 2019-07-02 DIAGNOSIS — R0600 Dyspnea, unspecified: Secondary | ICD-10-CM | POA: Diagnosis not present

## 2019-07-02 DIAGNOSIS — K279 Peptic ulcer, site unspecified, unspecified as acute or chronic, without hemorrhage or perforation: Secondary | ICD-10-CM | POA: Diagnosis not present

## 2019-07-02 DIAGNOSIS — R7989 Other specified abnormal findings of blood chemistry: Secondary | ICD-10-CM | POA: Diagnosis not present

## 2019-07-02 DIAGNOSIS — I482 Chronic atrial fibrillation, unspecified: Secondary | ICD-10-CM | POA: Diagnosis not present

## 2019-07-02 DIAGNOSIS — E7800 Pure hypercholesterolemia, unspecified: Secondary | ICD-10-CM | POA: Diagnosis not present

## 2019-07-02 DIAGNOSIS — K922 Gastrointestinal hemorrhage, unspecified: Secondary | ICD-10-CM | POA: Diagnosis not present

## 2019-07-02 DIAGNOSIS — I10 Essential (primary) hypertension: Secondary | ICD-10-CM | POA: Diagnosis not present

## 2019-07-02 DIAGNOSIS — R609 Edema, unspecified: Secondary | ICD-10-CM | POA: Diagnosis not present

## 2019-07-02 DIAGNOSIS — J309 Allergic rhinitis, unspecified: Secondary | ICD-10-CM | POA: Diagnosis not present

## 2019-07-02 DIAGNOSIS — D509 Iron deficiency anemia, unspecified: Secondary | ICD-10-CM | POA: Diagnosis not present

## 2019-07-02 DIAGNOSIS — R809 Proteinuria, unspecified: Secondary | ICD-10-CM | POA: Diagnosis not present

## 2019-07-04 ENCOUNTER — Other Ambulatory Visit: Payer: Self-pay | Admitting: Medical

## 2019-07-08 DIAGNOSIS — R197 Diarrhea, unspecified: Secondary | ICD-10-CM | POA: Diagnosis not present

## 2019-07-08 DIAGNOSIS — R7989 Other specified abnormal findings of blood chemistry: Secondary | ICD-10-CM | POA: Diagnosis not present

## 2019-07-08 DIAGNOSIS — J208 Acute bronchitis due to other specified organisms: Secondary | ICD-10-CM | POA: Diagnosis not present

## 2019-07-08 DIAGNOSIS — K279 Peptic ulcer, site unspecified, unspecified as acute or chronic, without hemorrhage or perforation: Secondary | ICD-10-CM | POA: Diagnosis not present

## 2019-07-08 DIAGNOSIS — R809 Proteinuria, unspecified: Secondary | ICD-10-CM | POA: Diagnosis not present

## 2019-07-08 DIAGNOSIS — R0600 Dyspnea, unspecified: Secondary | ICD-10-CM | POA: Diagnosis not present

## 2019-07-08 DIAGNOSIS — I482 Chronic atrial fibrillation, unspecified: Secondary | ICD-10-CM | POA: Diagnosis not present

## 2019-07-08 DIAGNOSIS — E663 Overweight: Secondary | ICD-10-CM | POA: Diagnosis not present

## 2019-07-08 DIAGNOSIS — K922 Gastrointestinal hemorrhage, unspecified: Secondary | ICD-10-CM | POA: Diagnosis not present

## 2019-07-08 DIAGNOSIS — M25512 Pain in left shoulder: Secondary | ICD-10-CM | POA: Diagnosis not present

## 2019-07-08 NOTE — Telephone Encounter (Signed)
Left voice message requesting return call to clarify amiodarone dose currently taking and if she has enough medication to get through to f/u visit with Dr. Bettina Gavia 07/14/19.

## 2019-07-09 ENCOUNTER — Inpatient Hospital Stay (HOSPITAL_COMMUNITY): Payer: Medicare HMO

## 2019-07-09 ENCOUNTER — Inpatient Hospital Stay (HOSPITAL_COMMUNITY)
Admission: EM | Admit: 2019-07-09 | Discharge: 2019-07-13 | DRG: 378 | Disposition: A | Discharge status: Home Health | Payer: Medicare HMO | Attending: Family Medicine | Admitting: Family Medicine

## 2019-07-09 ENCOUNTER — Encounter (HOSPITAL_COMMUNITY): Payer: Self-pay | Admitting: *Deleted

## 2019-07-09 ENCOUNTER — Other Ambulatory Visit: Payer: Self-pay

## 2019-07-09 DIAGNOSIS — D649 Anemia, unspecified: Secondary | ICD-10-CM | POA: Diagnosis present

## 2019-07-09 DIAGNOSIS — Z20828 Contact with and (suspected) exposure to other viral communicable diseases: Secondary | ICD-10-CM | POA: Diagnosis not present

## 2019-07-09 DIAGNOSIS — N179 Acute kidney failure, unspecified: Secondary | ICD-10-CM | POA: Diagnosis not present

## 2019-07-09 DIAGNOSIS — Z7901 Long term (current) use of anticoagulants: Secondary | ICD-10-CM

## 2019-07-09 DIAGNOSIS — E039 Hypothyroidism, unspecified: Secondary | ICD-10-CM | POA: Diagnosis present

## 2019-07-09 DIAGNOSIS — I255 Ischemic cardiomyopathy: Secondary | ICD-10-CM | POA: Diagnosis present

## 2019-07-09 DIAGNOSIS — K635 Polyp of colon: Secondary | ICD-10-CM | POA: Diagnosis not present

## 2019-07-09 DIAGNOSIS — K921 Melena: Secondary | ICD-10-CM

## 2019-07-09 DIAGNOSIS — R531 Weakness: Secondary | ICD-10-CM | POA: Diagnosis not present

## 2019-07-09 DIAGNOSIS — S2241XA Multiple fractures of ribs, right side, initial encounter for closed fracture: Secondary | ICD-10-CM | POA: Diagnosis present

## 2019-07-09 DIAGNOSIS — E0590 Thyrotoxicosis, unspecified without thyrotoxic crisis or storm: Secondary | ICD-10-CM | POA: Diagnosis present

## 2019-07-09 DIAGNOSIS — I5022 Chronic systolic (congestive) heart failure: Secondary | ICD-10-CM | POA: Diagnosis not present

## 2019-07-09 DIAGNOSIS — I428 Other cardiomyopathies: Secondary | ICD-10-CM | POA: Diagnosis present

## 2019-07-09 DIAGNOSIS — K922 Gastrointestinal hemorrhage, unspecified: Secondary | ICD-10-CM

## 2019-07-09 DIAGNOSIS — Z8249 Family history of ischemic heart disease and other diseases of the circulatory system: Secondary | ICD-10-CM

## 2019-07-09 DIAGNOSIS — M549 Dorsalgia, unspecified: Secondary | ICD-10-CM | POA: Diagnosis not present

## 2019-07-09 DIAGNOSIS — K5730 Diverticulosis of large intestine without perforation or abscess without bleeding: Secondary | ICD-10-CM | POA: Diagnosis present

## 2019-07-09 DIAGNOSIS — R4020 Unspecified coma: Secondary | ICD-10-CM | POA: Diagnosis not present

## 2019-07-09 DIAGNOSIS — I959 Hypotension, unspecified: Secondary | ICD-10-CM | POA: Diagnosis not present

## 2019-07-09 DIAGNOSIS — R296 Repeated falls: Secondary | ICD-10-CM | POA: Diagnosis present

## 2019-07-09 DIAGNOSIS — Z87891 Personal history of nicotine dependence: Secondary | ICD-10-CM

## 2019-07-09 DIAGNOSIS — E860 Dehydration: Secondary | ICD-10-CM | POA: Diagnosis present

## 2019-07-09 DIAGNOSIS — W19XXXA Unspecified fall, initial encounter: Secondary | ICD-10-CM | POA: Diagnosis present

## 2019-07-09 DIAGNOSIS — K259 Gastric ulcer, unspecified as acute or chronic, without hemorrhage or perforation: Secondary | ICD-10-CM | POA: Diagnosis not present

## 2019-07-09 DIAGNOSIS — E876 Hypokalemia: Secondary | ICD-10-CM | POA: Diagnosis present

## 2019-07-09 DIAGNOSIS — L8990 Pressure ulcer of unspecified site, unspecified stage: Secondary | ICD-10-CM | POA: Diagnosis present

## 2019-07-09 DIAGNOSIS — Z8349 Family history of other endocrine, nutritional and metabolic diseases: Secondary | ICD-10-CM

## 2019-07-09 DIAGNOSIS — F419 Anxiety disorder, unspecified: Secondary | ICD-10-CM | POA: Diagnosis present

## 2019-07-09 DIAGNOSIS — W2201XA Walked into wall, initial encounter: Secondary | ICD-10-CM | POA: Diagnosis present

## 2019-07-09 DIAGNOSIS — K254 Chronic or unspecified gastric ulcer with hemorrhage: Principal | ICD-10-CM | POA: Diagnosis present

## 2019-07-09 DIAGNOSIS — R0602 Shortness of breath: Secondary | ICD-10-CM

## 2019-07-09 DIAGNOSIS — D509 Iron deficiency anemia, unspecified: Secondary | ICD-10-CM | POA: Diagnosis not present

## 2019-07-09 DIAGNOSIS — Z79891 Long term (current) use of opiate analgesic: Secondary | ICD-10-CM

## 2019-07-09 DIAGNOSIS — Z800 Family history of malignant neoplasm of digestive organs: Secondary | ICD-10-CM

## 2019-07-09 DIAGNOSIS — R58 Hemorrhage, not elsewhere classified: Secondary | ICD-10-CM | POA: Diagnosis not present

## 2019-07-09 DIAGNOSIS — Z823 Family history of stroke: Secondary | ICD-10-CM

## 2019-07-09 DIAGNOSIS — M545 Low back pain: Secondary | ICD-10-CM | POA: Diagnosis present

## 2019-07-09 DIAGNOSIS — I4819 Other persistent atrial fibrillation: Secondary | ICD-10-CM | POA: Diagnosis present

## 2019-07-09 DIAGNOSIS — M1990 Unspecified osteoarthritis, unspecified site: Secondary | ICD-10-CM | POA: Diagnosis present

## 2019-07-09 DIAGNOSIS — S2249XA Multiple fractures of ribs, unspecified side, initial encounter for closed fracture: Secondary | ICD-10-CM | POA: Diagnosis not present

## 2019-07-09 DIAGNOSIS — N183 Chronic kidney disease, stage 3 unspecified: Secondary | ICD-10-CM | POA: Diagnosis present

## 2019-07-09 DIAGNOSIS — D125 Benign neoplasm of sigmoid colon: Secondary | ICD-10-CM | POA: Diagnosis not present

## 2019-07-09 DIAGNOSIS — I130 Hypertensive heart and chronic kidney disease with heart failure and stage 1 through stage 4 chronic kidney disease, or unspecified chronic kidney disease: Secondary | ICD-10-CM | POA: Diagnosis present

## 2019-07-09 DIAGNOSIS — F329 Major depressive disorder, single episode, unspecified: Secondary | ICD-10-CM | POA: Diagnosis present

## 2019-07-09 DIAGNOSIS — E785 Hyperlipidemia, unspecified: Secondary | ICD-10-CM | POA: Diagnosis present

## 2019-07-09 DIAGNOSIS — R0902 Hypoxemia: Secondary | ICD-10-CM | POA: Diagnosis not present

## 2019-07-09 DIAGNOSIS — Z79899 Other long term (current) drug therapy: Secondary | ICD-10-CM

## 2019-07-09 DIAGNOSIS — F1390 Sedative, hypnotic, or anxiolytic use, unspecified, uncomplicated: Secondary | ICD-10-CM | POA: Diagnosis present

## 2019-07-09 DIAGNOSIS — D62 Acute posthemorrhagic anemia: Secondary | ICD-10-CM

## 2019-07-09 DIAGNOSIS — S3993XA Unspecified injury of pelvis, initial encounter: Secondary | ICD-10-CM | POA: Diagnosis not present

## 2019-07-09 DIAGNOSIS — D120 Benign neoplasm of cecum: Secondary | ICD-10-CM | POA: Diagnosis not present

## 2019-07-09 DIAGNOSIS — K319 Disease of stomach and duodenum, unspecified: Secondary | ICD-10-CM | POA: Diagnosis present

## 2019-07-09 DIAGNOSIS — E1122 Type 2 diabetes mellitus with diabetic chronic kidney disease: Secondary | ICD-10-CM | POA: Diagnosis present

## 2019-07-09 DIAGNOSIS — Z794 Long term (current) use of insulin: Secondary | ICD-10-CM

## 2019-07-09 DIAGNOSIS — J069 Acute upper respiratory infection, unspecified: Secondary | ICD-10-CM | POA: Diagnosis present

## 2019-07-09 DIAGNOSIS — R197 Diarrhea, unspecified: Secondary | ICD-10-CM | POA: Diagnosis not present

## 2019-07-09 DIAGNOSIS — I2510 Atherosclerotic heart disease of native coronary artery without angina pectoris: Secondary | ICD-10-CM | POA: Diagnosis not present

## 2019-07-09 HISTORY — DX: Pressure ulcer of unspecified site, unspecified stage: L89.90

## 2019-07-09 LAB — URINALYSIS, ROUTINE W REFLEX MICROSCOPIC
Bilirubin Urine: NEGATIVE
Glucose, UA: NEGATIVE mg/dL
Hgb urine dipstick: NEGATIVE
Ketones, ur: NEGATIVE mg/dL
Leukocytes,Ua: NEGATIVE
Nitrite: NEGATIVE
Protein, ur: NEGATIVE mg/dL
Specific Gravity, Urine: 1.006 (ref 1.005–1.030)
pH: 6 (ref 5.0–8.0)

## 2019-07-09 LAB — COMPREHENSIVE METABOLIC PANEL
ALT: 36 U/L (ref 0–44)
AST: 27 U/L (ref 15–41)
Albumin: 2.6 g/dL — ABNORMAL LOW (ref 3.5–5.0)
Alkaline Phosphatase: 84 U/L (ref 38–126)
Anion gap: 14 (ref 5–15)
BUN: 24 mg/dL — ABNORMAL HIGH (ref 6–20)
CO2: 20 mmol/L — ABNORMAL LOW (ref 22–32)
Calcium: 8.2 mg/dL — ABNORMAL LOW (ref 8.9–10.3)
Chloride: 97 mmol/L — ABNORMAL LOW (ref 98–111)
Creatinine, Ser: 1.61 mg/dL — ABNORMAL HIGH (ref 0.44–1.00)
GFR calc Af Amer: 40 mL/min — ABNORMAL LOW (ref >60)
GFR calc non Af Amer: 35 mL/min — ABNORMAL LOW (ref >60)
Glucose, Bld: 115 mg/dL — ABNORMAL HIGH (ref 70–99)
Potassium: 2.6 mmol/L — CL (ref 3.5–5.1)
Sodium: 131 mmol/L — ABNORMAL LOW (ref 135–145)
Total Bilirubin: 0.9 mg/dL (ref 0.3–1.2)
Total Protein: 5 g/dL — ABNORMAL LOW (ref 6.5–8.1)

## 2019-07-09 LAB — HEMOGLOBIN AND HEMATOCRIT, BLOOD
HCT: 27.2 % — ABNORMAL LOW (ref 36.0–46.0)
Hemoglobin: 8.8 g/dL — ABNORMAL LOW (ref 12.0–15.0)

## 2019-07-09 LAB — CBC WITH DIFFERENTIAL/PLATELET
Abs Immature Granulocytes: 0.18 10*3/uL — ABNORMAL HIGH (ref 0.00–0.07)
Basophils Absolute: 0 10*3/uL (ref 0.0–0.1)
Basophils Relative: 0 %
Eosinophils Absolute: 0.1 10*3/uL (ref 0.0–0.5)
Eosinophils Relative: 1 %
HCT: 19.7 % — ABNORMAL LOW (ref 36.0–46.0)
Hemoglobin: 6 g/dL — CL (ref 12.0–15.0)
Immature Granulocytes: 2 %
Lymphocytes Relative: 9 %
Lymphs Abs: 1.1 10*3/uL (ref 0.7–4.0)
MCH: 30.2 pg (ref 26.0–34.0)
MCHC: 30.5 g/dL (ref 30.0–36.0)
MCV: 99 fL (ref 80.0–100.0)
Monocytes Absolute: 0.3 10*3/uL (ref 0.1–1.0)
Monocytes Relative: 3 %
Neutro Abs: 10.2 10*3/uL — ABNORMAL HIGH (ref 1.7–7.7)
Neutrophils Relative %: 85 %
Platelets: 535 10*3/uL — ABNORMAL HIGH (ref 150–400)
RBC: 1.99 MIL/uL — ABNORMAL LOW (ref 3.87–5.11)
RDW: 19.1 % — ABNORMAL HIGH (ref 11.5–15.5)
WBC: 11.9 10*3/uL — ABNORMAL HIGH (ref 4.0–10.5)
nRBC: 0.3 % — ABNORMAL HIGH (ref 0.0–0.2)

## 2019-07-09 LAB — CBC
HCT: 19.4 % — ABNORMAL LOW (ref 36.0–46.0)
Hemoglobin: 5.9 g/dL — CL (ref 12.0–15.0)
MCH: 29.9 pg (ref 26.0–34.0)
MCHC: 30.4 g/dL (ref 30.0–36.0)
MCV: 98.5 fL (ref 80.0–100.0)
Platelets: 487 10*3/uL — ABNORMAL HIGH (ref 150–400)
RBC: 1.97 MIL/uL — ABNORMAL LOW (ref 3.87–5.11)
RDW: 19.1 % — ABNORMAL HIGH (ref 11.5–15.5)
WBC: 11.1 10*3/uL — ABNORMAL HIGH (ref 4.0–10.5)
nRBC: 0 % (ref 0.0–0.2)

## 2019-07-09 LAB — BASIC METABOLIC PANEL
Anion gap: 9 (ref 5–15)
Anion gap: 9 (ref 5–15)
BUN: 19 mg/dL (ref 6–20)
BUN: 15 mg/dL (ref 6–20)
CO2: 21 mmol/L — ABNORMAL LOW (ref 22–32)
CO2: 20 mmol/L — ABNORMAL LOW (ref 22–32)
Calcium: 7.8 mg/dL — ABNORMAL LOW (ref 8.9–10.3)
Calcium: 8.2 mg/dL — ABNORMAL LOW (ref 8.9–10.3)
Chloride: 106 mmol/L (ref 98–111)
Chloride: 106 mmol/L (ref 98–111)
Creatinine, Ser: 1.34 mg/dL — ABNORMAL HIGH (ref 0.44–1.00)
Creatinine, Ser: 1.23 mg/dL — ABNORMAL HIGH (ref 0.44–1.00)
GFR calc Af Amer: 50 mL/min — ABNORMAL LOW (ref >60)
GFR calc Af Amer: 56 mL/min — ABNORMAL LOW (ref >60)
GFR calc non Af Amer: 44 mL/min — ABNORMAL LOW (ref >60)
GFR calc non Af Amer: 48 mL/min — ABNORMAL LOW (ref >60)
Glucose, Bld: 87 mg/dL (ref 70–99)
Glucose, Bld: 99 mg/dL (ref 70–99)
Potassium: 2.9 mmol/L — ABNORMAL LOW (ref 3.5–5.1)
Potassium: 3.8 mmol/L (ref 3.5–5.1)
Sodium: 136 mmol/L (ref 135–145)
Sodium: 135 mmol/L (ref 135–145)

## 2019-07-09 LAB — PREPARE RBC (CROSSMATCH)

## 2019-07-09 LAB — MAGNESIUM: Magnesium: 1.9 mg/dL (ref 1.7–2.4)

## 2019-07-09 LAB — PROTIME-INR
INR: 1.2 (ref 0.8–1.2)
Prothrombin Time: 14.7 seconds (ref 11.4–15.2)

## 2019-07-09 LAB — BRAIN NATRIURETIC PEPTIDE: B Natriuretic Peptide: 144.1 pg/mL — ABNORMAL HIGH (ref 0.0–100.0)

## 2019-07-09 LAB — SARS CORONAVIRUS 2 (TAT 6-24 HRS): SARS Coronavirus 2: NEGATIVE

## 2019-07-09 IMAGING — DX DG CHEST 1V PORT
1 series · 1 of 1 positions shown · non-contrast
Comparison: Radiograph [DATE]

CLINICAL DATA: Shortness of breath

EXAM:
PORTABLE CHEST 1 VIEW

[chest ap]
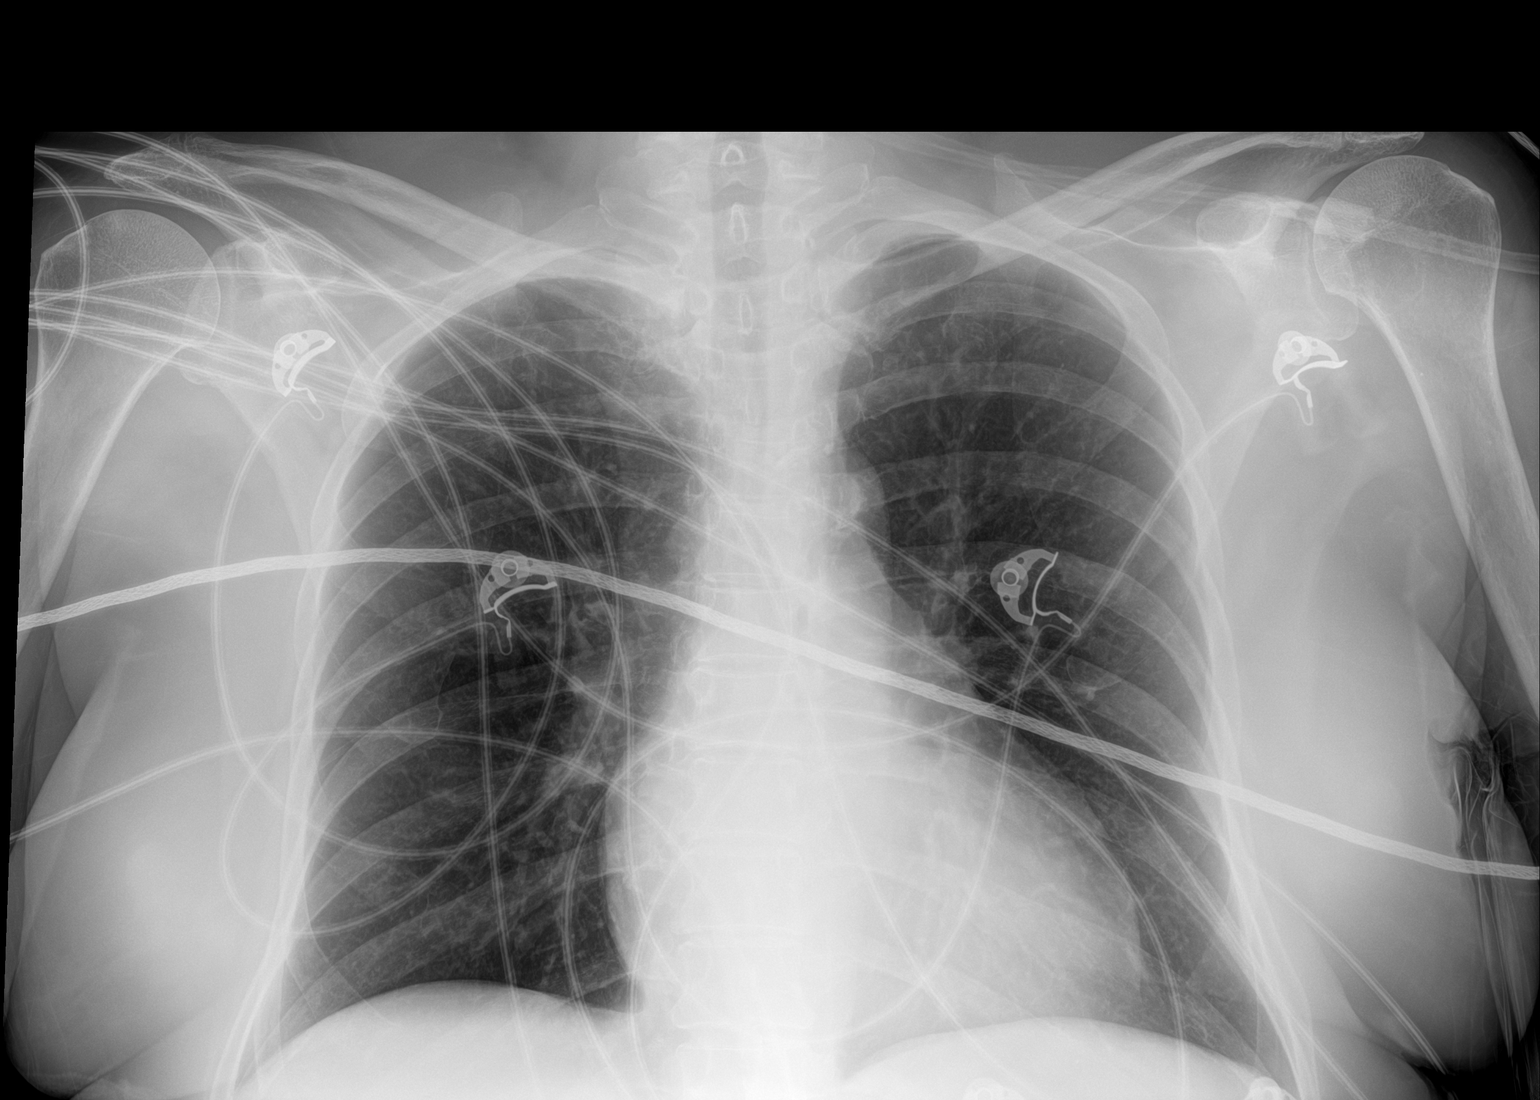

[1 of 1 positions shown; findings below may reference images not displayed]

FINDINGS: The cardiomediastinal contours are unchanged with borderline
cardiomegaly. The lungs are clear. Pulmonary vasculature is normal.
No consolidation, pleural effusion, or pneumothorax. No acute
osseous abnormalities are seen.
IMPRESSION: No acute findings.

## 2019-07-09 IMAGING — CT CT HEAD W/O CM
4 series · 17 of 47 positions shown, 19 images · non-contrast
Comparison: [DATE]

CLINICAL DATA: Altered level of consciousness, unexplained

EXAM:
CT HEAD WITHOUT CONTRAST
TECHNIQUE: Contiguous axial images were obtained from the base of the skull
through the vertex without intravenous contrast.

[Series 3: head without · axial · non-contrast · 0.42mm/px · z∈[-104,+22]mm · 7 of 35 slices shown, 9 images]
[im 5/35  brain]
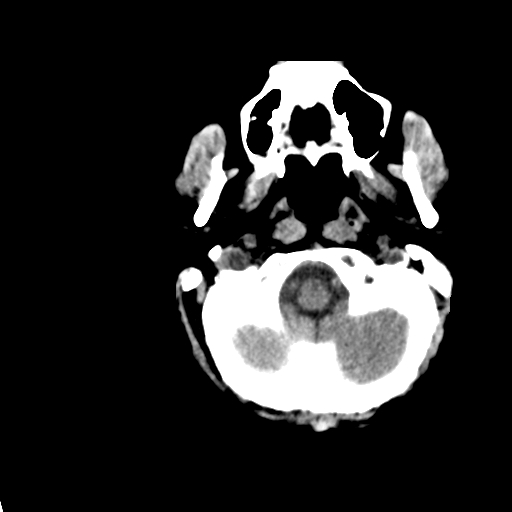
[im 5/35  bone]
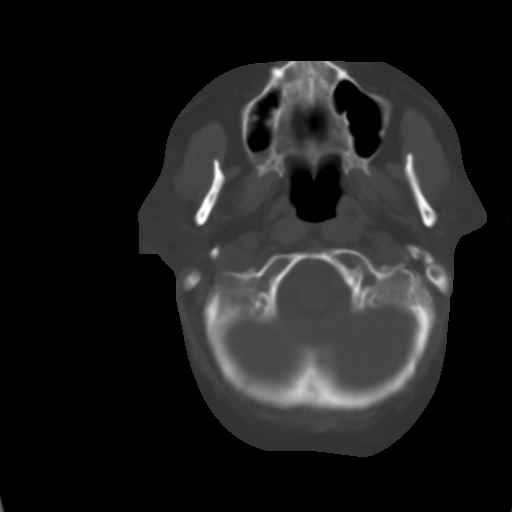
[im 9/35  brain]
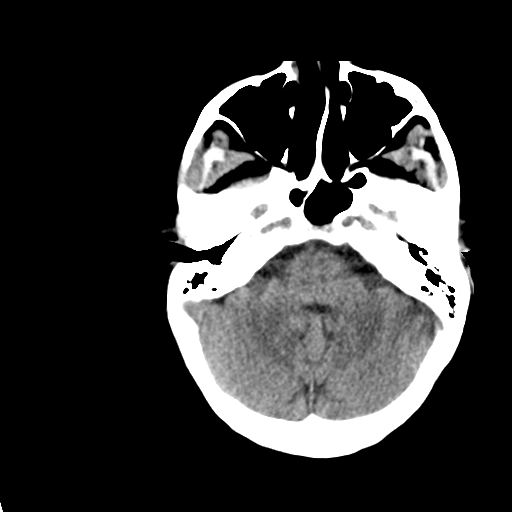
[im 13/35  brain]
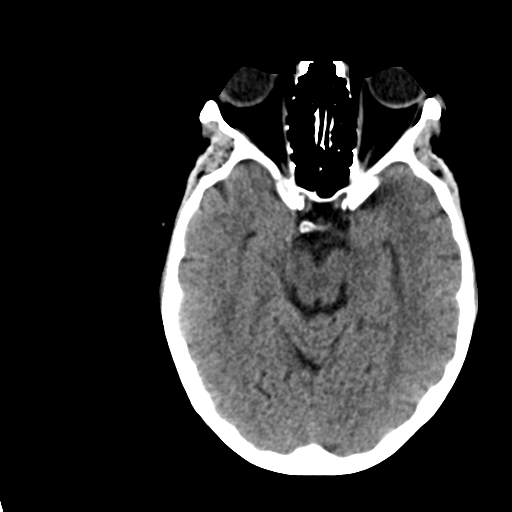
[im 18/35  brain]
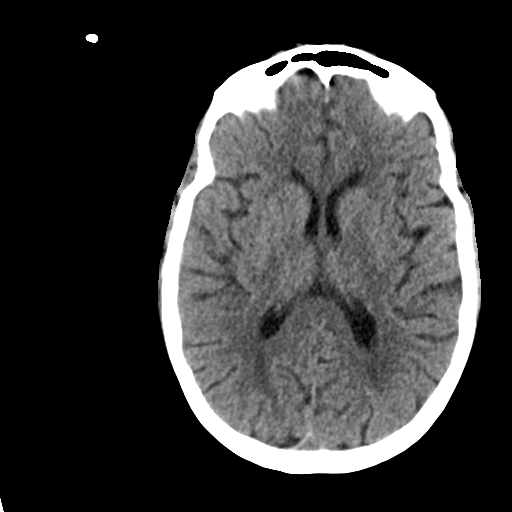
[im 22/35  brain]
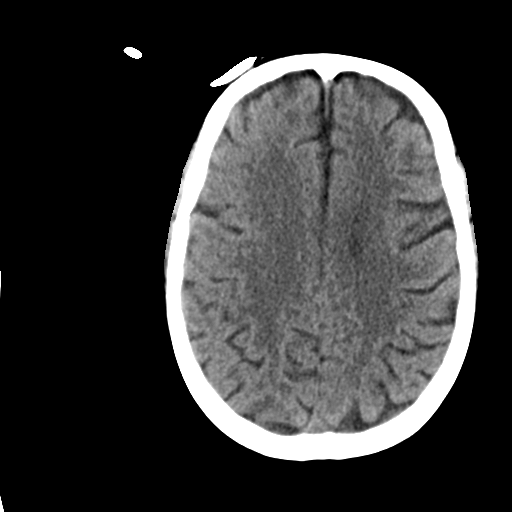
[im 22/35  bone]
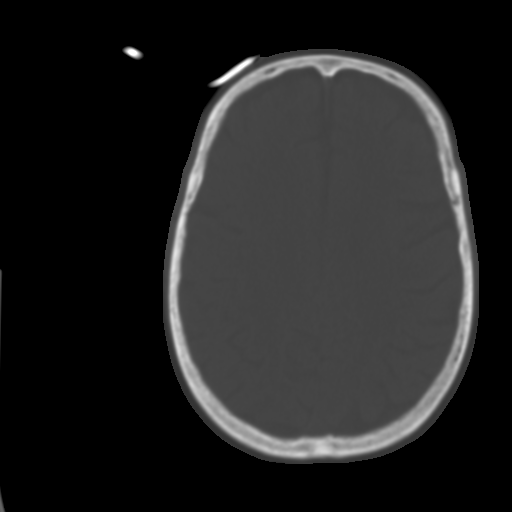
[im 26/35  brain]
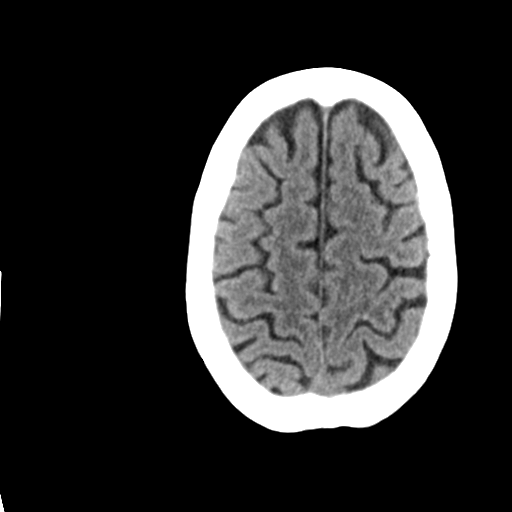
[im 30/35  brain]
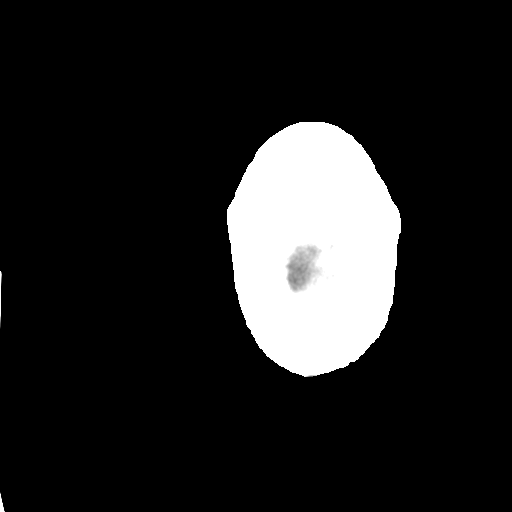

[Series 4: head bone · axial · 0.42mm/px · z∈[-108,-48]mm · 4 of 87 slices shown]
[im 9/87  bone]
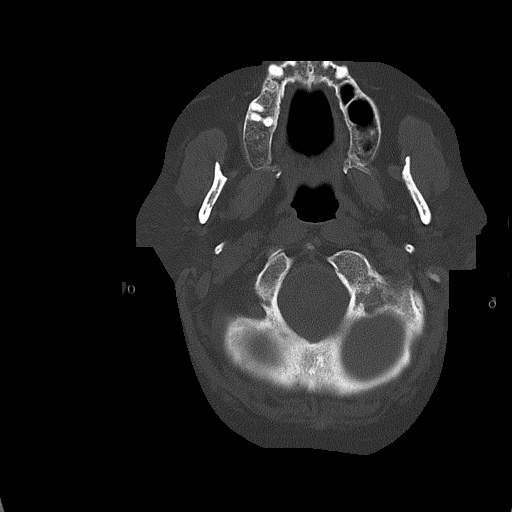
[im 18/87  bone]
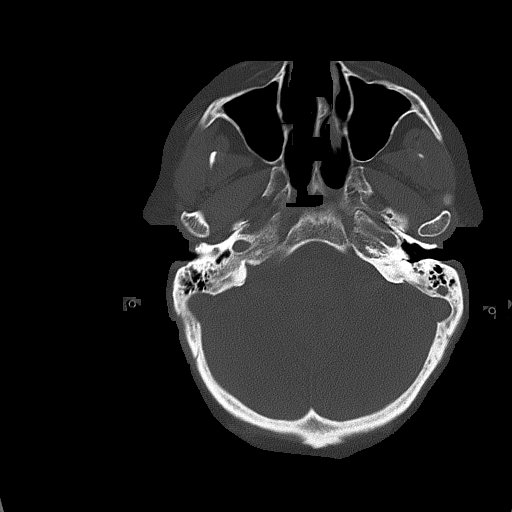
[im 26/87  bone]
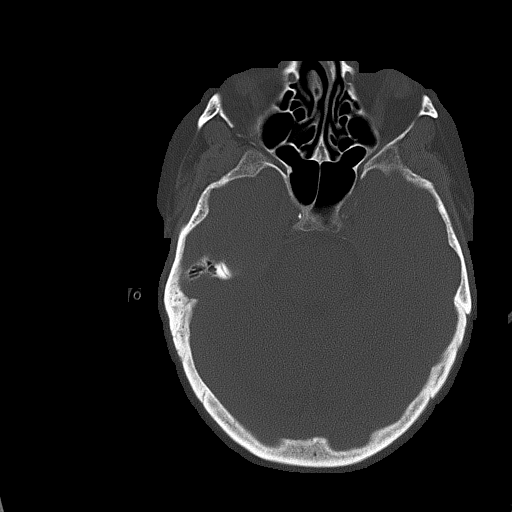
[im 39/87  bone]
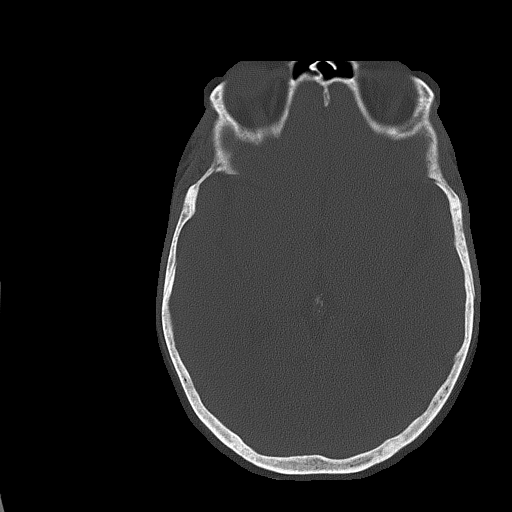

[Series 5: head without cor · coronal · non-contrast · 0.34mm/px · 3 of 68 slices shown]
[im 23/68  brain]
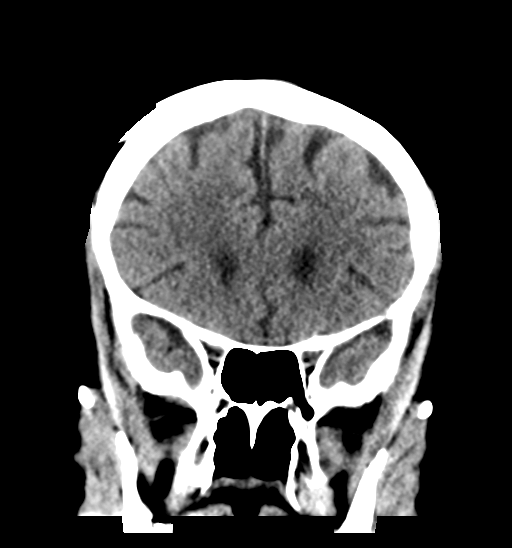
[im 30/68  brain]
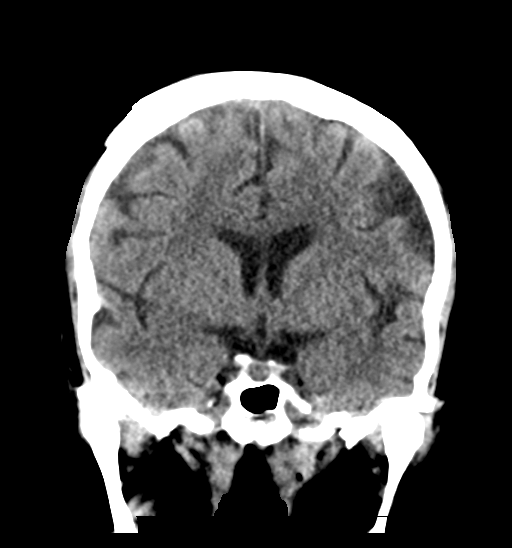
[im 38/68  brain]
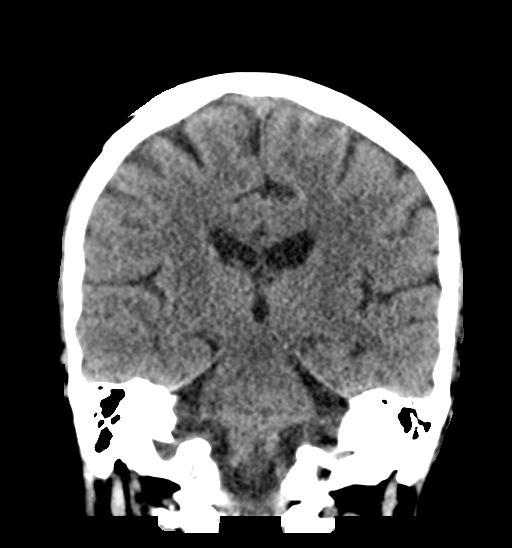

[Series 6: head without sag · sagittal · non-contrast · 0.36mm/px · 3 of 57 slices shown]
[im 19/57  brain]
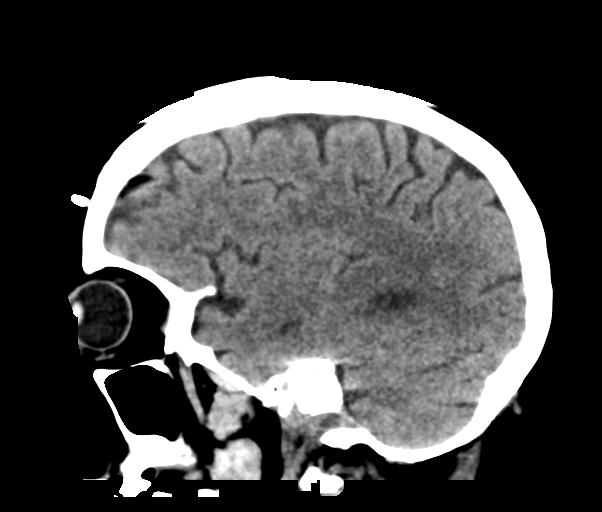
[im 29/57  brain]
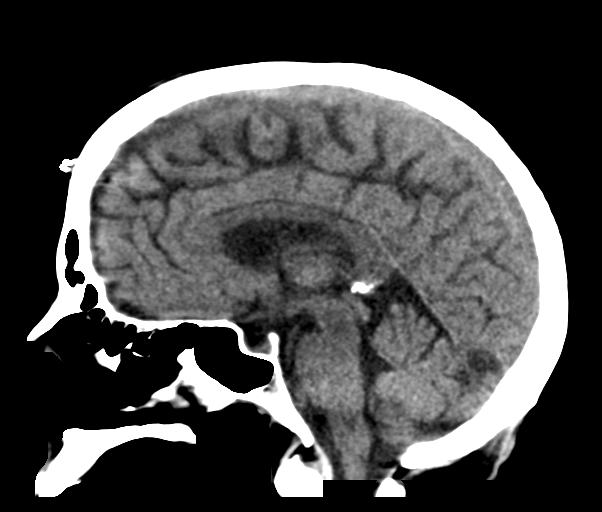
[im 38/57  brain]
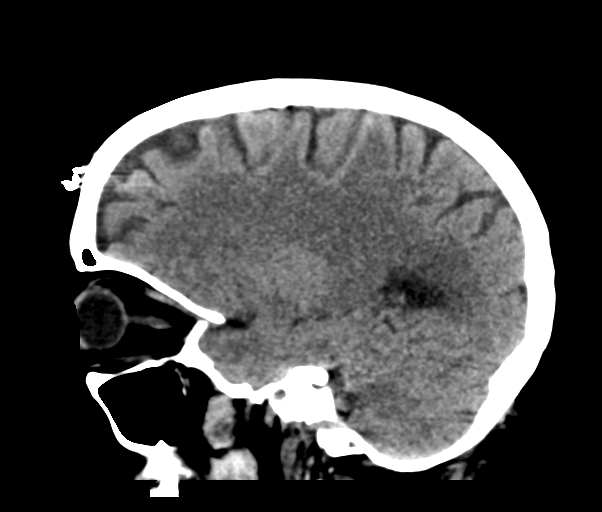

[17 of 47 positions shown; findings below may reference images not displayed]

FINDINGS: Brain: No evidence of acute infarction, hemorrhage, hydrocephalus,
extra-axial collection or mass lesion/mass effect. Mild white matter
disease better seen by interval brain MRI. Normal brain volume.

Vascular: No hyperdense vessel or unexpected calcification.

Skull: Normal. Negative for fracture or focal lesion.

Sinuses/Orbits: No acute finding.

Other: Artifact from temperature probe over the forehead.
IMPRESSION: No acute or interval finding.

## 2019-07-09 MED ORDER — POTASSIUM CHLORIDE 10 MEQ/100ML IV SOLN
10.0000 meq | INTRAVENOUS | Status: AC
  Administered 2019-07-09 (×2): 10 meq via INTRAVENOUS
  Filled 2019-07-09 (×2): qty 100

## 2019-07-09 MED ORDER — FUROSEMIDE 40 MG PO TABS
40.0000 mg | ORAL_TABLET | Freq: Every day | ORAL | Status: DC
  Administered 2019-07-09 – 2019-07-13 (×5): 40 mg via ORAL
  Filled 2019-07-09 (×5): qty 1

## 2019-07-09 MED ORDER — ACETAMINOPHEN 650 MG RE SUPP: 650.0000 mg | Freq: Four times a day (QID) | RECTAL | Status: DC | PRN

## 2019-07-09 MED ORDER — ALPRAZOLAM 0.5 MG PO TABS
0.5000 mg | ORAL_TABLET | Freq: Two times a day (BID) | ORAL | Status: DC
  Administered 2019-07-09: 1 mg via ORAL
  Filled 2019-07-09: qty 2

## 2019-07-09 MED ORDER — SODIUM CHLORIDE 0.9 % IV SOLN
INTRAVENOUS | Status: DC
  Administered 2019-07-09: 08:00:00 via INTRAVENOUS

## 2019-07-09 MED ORDER — ALPRAZOLAM 0.5 MG PO TABS
0.5000 mg | ORAL_TABLET | Freq: Two times a day (BID) | ORAL | Status: DC
  Administered 2019-07-10 – 2019-07-12 (×6): 0.5 mg via ORAL
  Filled 2019-07-09 (×6): qty 1

## 2019-07-09 MED ORDER — ALPRAZOLAM 0.5 MG PO TABS
0.5000 mg | ORAL_TABLET | Freq: Two times a day (BID) | ORAL | Status: DC
  Filled 2019-07-09 (×2): qty 2

## 2019-07-09 MED ORDER — POTASSIUM CHLORIDE 10 MEQ/100ML IV SOLN
10.0000 meq | INTRAVENOUS | Status: DC
  Filled 2019-07-09: qty 100

## 2019-07-09 MED ORDER — SODIUM CHLORIDE 0.9 % IV SOLN
80.0000 mg | Freq: Once | INTRAVENOUS | Status: AC
  Administered 2019-07-09: 80 mg via INTRAVENOUS
  Filled 2019-07-09: qty 80

## 2019-07-09 MED ORDER — MUSCLE RUB 10-15 % EX CREA: TOPICAL_CREAM | Freq: Three times a day (TID) | CUTANEOUS | Status: DC | PRN

## 2019-07-09 MED ORDER — ACETAMINOPHEN 325 MG PO TABS
650.0000 mg | ORAL_TABLET | Freq: Four times a day (QID) | ORAL | Status: DC | PRN
  Administered 2019-07-10: 650 mg via ORAL
  Filled 2019-07-09: qty 2

## 2019-07-09 MED ORDER — POTASSIUM CHLORIDE 10 MEQ/100ML IV SOLN
10.0000 meq | INTRAVENOUS | Status: DC
  Administered 2019-07-09 (×3): 10 meq via INTRAVENOUS
  Filled 2019-07-09: qty 100

## 2019-07-09 MED ORDER — POTASSIUM CHLORIDE CRYS ER 20 MEQ PO TBCR
40.0000 meq | EXTENDED_RELEASE_TABLET | Freq: Once | ORAL | Status: AC
  Administered 2019-07-09: 40 meq via ORAL
  Filled 2019-07-09: qty 2

## 2019-07-09 MED ORDER — METOPROLOL SUCCINATE ER 50 MG PO TB24: 50.0000 mg | ORAL_TABLET | Freq: Two times a day (BID) | ORAL | Status: DC

## 2019-07-09 MED ORDER — METHIMAZOLE 5 MG PO TABS
5.0000 mg | ORAL_TABLET | Freq: Every day | ORAL | Status: DC
  Administered 2019-07-09 – 2019-07-12 (×4): 5 mg via ORAL
  Filled 2019-07-09 (×7): qty 1

## 2019-07-09 MED ORDER — DULOXETINE HCL 60 MG PO CPEP
60.0000 mg | ORAL_CAPSULE | Freq: Two times a day (BID) | ORAL | Status: DC
  Administered 2019-07-09 – 2019-07-13 (×8): 60 mg via ORAL
  Filled 2019-07-09 (×8): qty 1

## 2019-07-09 MED ORDER — PANTOPRAZOLE SODIUM 40 MG IV SOLR
40.0000 mg | Freq: Two times a day (BID) | INTRAVENOUS | Status: DC
  Administered 2019-07-09 – 2019-07-10 (×3): 40 mg via INTRAVENOUS
  Filled 2019-07-09 (×3): qty 40

## 2019-07-09 MED ORDER — SODIUM CHLORIDE 0.9% IV SOLUTION: Freq: Once | INTRAVENOUS | Status: DC

## 2019-07-09 MED ORDER — SODIUM CHLORIDE 0.9 % IV SOLN
8.0000 mg/h | INTRAVENOUS | Status: DC
  Administered 2019-07-09: 8 mg/h via INTRAVENOUS
  Filled 2019-07-09: qty 80

## 2019-07-09 MED ORDER — SODIUM CHLORIDE 0.9 % IV BOLUS
1000.0000 mL | Freq: Once | INTRAVENOUS | Status: AC
  Administered 2019-07-09: 1000 mL via INTRAVENOUS

## 2019-07-09 MED ORDER — POTASSIUM CHLORIDE CRYS ER 20 MEQ PO TBCR
20.0000 meq | EXTENDED_RELEASE_TABLET | Freq: Once | ORAL | Status: DC
  Filled 2019-07-09: qty 1

## 2019-07-09 MED ORDER — SODIUM CHLORIDE 0.9 % IV SOLN
80.0000 mg | Freq: Two times a day (BID) | INTRAVENOUS | Status: DC
  Filled 2019-07-09: qty 80

## 2019-07-09 MED ORDER — TRAMADOL HCL 50 MG PO TABS
50.0000 mg | ORAL_TABLET | Freq: Four times a day (QID) | ORAL | Status: DC | PRN
  Administered 2019-07-09 – 2019-07-13 (×4): 50 mg via ORAL
  Filled 2019-07-09 (×4): qty 1

## 2019-07-09 MED ORDER — AMIODARONE HCL 200 MG PO TABS
200.0000 mg | ORAL_TABLET | Freq: Two times a day (BID) | ORAL | Status: DC
  Administered 2019-07-09 – 2019-07-13 (×9): 200 mg via ORAL
  Filled 2019-07-09 (×9): qty 1

## 2019-07-09 NOTE — ED Notes (Signed)
Admitting doctors at  The bedside

## 2019-07-09 NOTE — ED Notes (Signed)
Pt sleeping soundly vitals good

## 2019-07-09 NOTE — ED Notes (Signed)
Pt returned from c-t sleeping snoring

## 2019-07-09 NOTE — Consult Note (Addendum)
[facility] Consult: 9:46 AM 07/09/2019  LOS: 0 days    Referring Provider: Dr Ardelia Mems  Primary Care Physician:  Cher Nakai, MD in [place]. Primary Gastroenterologist: In [place].  Dr. Nehemiah Settle   Reason for Consultation:  GI bleed.  Anemia.    HPI: Brianna Porter is a 58 y.o. female.  Paroxysmal atrial fibrillation.  Chronic Eliquis.  Hypothyroidism.  Anxiety.  CHF, EF 20 to 25%.  Stage 2 CKD.  Previous colonoscopies, at the first colonoscopy she had polyps.  The second colonoscopy was in ~2017 and there were no recurrent polyps.   During admission in 11/8 - 06/30/2019 Dr Henrene Pastor evaluated melena, anemia, burning epigastric pain in setting of 400 to 600 mg ibuprofen/day.   06/29/2019 EGD: Nonbleeding gastric ulcers, no stigmata of bleeding, otherwise normal study.  Pathology showed reactive gastropathy.  No H. pylori, no intestinal metaplasia Other issues were failure.  Apixaban started prior to discharge, rplacing Xarelto which may have caused lower extremity pruritus. Hgb 6.5 >> 1 PRBC >> 7.6 at dc.  RXd Protonix 40 mg bid.  Contrary to verbal report, she has been compliant with the Protonix and has not used any ibuprofen.  Did well at discharge.  Says she was eating fine without any problems, stools were brown.  However now day 3 of dark stools with burgundy coloration, not bright red blood, burning epigastric pain, weakness.  1 episode coffee-like emesis.  She took 1 Alka-Seltzer on Tuesday to try to deal with the burning in her epigastrium has had several falls in the last week and is complaining of new, severe low back pain.   Seen at [facility]D yest and RXd with z pack for URI, stool cards dispensed for FOBT test.  Because of the ongoing stools, weakness and taking another fall last night after taking 2 Xanax  to try to help her sleep, her daughter brought her to the [place] ED last night and she was subsequently transferred to [facility] ED due to lack of GI coverage in [place].  Initial BP 88/49 now in the 140s over 50s.  Pulses in the 60s to 13s.  O2 sats as low as 86% but now  90s to 100% Hgb 5.9.  BUN 24 (40 max last admit).  Potassium low at 2.9. CT of the head is negative.  CXR unremarkable. 1 PRBC ordered and currently transfusing. Received 80 mg bolus of Protonix and now on 40 mg IV bid.   Stool is dark and FOBT positive. Her biggest complaint is her low back pain.  Family history of malignant colon polyp in her mother who died with pancreatic cancer at age 73 in fall 2019. Pt does not drink ETOH.   Never drank.        Past Medical History:  Diagnosis Date   Anxiety and depression    Atrial flutter (HCC)    Chronic systolic CHF (congestive heart failure) ([place])    a. dx in setting of atrial fib/flutter - possibly tachy mediated. Coronary CTA with only mild CAD in 04/2019.   CKD (chronic kidney  disease), stage II    Confusion    a. persistent confusion during 04/2019 admission of unclear cause. Home meds adjusted. CT/MRI brain nonacute.   Diabetes ([place])    Edema    Elevated liver function tests    Hypercholesteremia    Hyperkalemia    Hypertension    Hypertensive heart and chronic kidney disease with systolic congestive heart failure (HCC)    Hyperthyroidism    Hypokalemia    Hypokalemia    Hypomagnesemia    Hyponatremia    Iron deficiency anemia    Mild CAD    a. Coronary CT 04/2019 - Minimal, Non-obstructive CAD.   NICM (nonischemic cardiomyopathy) ([place])    Persistent atrial fibrillation (HCC)    Prolonged QT interval     Past Surgical History:  Procedure Laterality Date   BIOPSY  06/29/2019   Procedure: BIOPSY;  Surgeon: Irene Shipper, MD;  Location: [facility];  Service: Endoscopy;;   CARDIOVERSION N/A 04/29/2019   Procedure: CARDIOVERSION;   Surgeon: Sanda Klein, MD;  Location: [facility] ENDOSCOPY;  Service: Cardiovascular;  Laterality: N/A;   CARDIOVERSION N/A 05/04/2019   Procedure: CARDIOVERSION;  Surgeon: Josue Hector, MD;  Location: [facility] ENDOSCOPY;  Service: Cardiovascular;  Laterality: N/A;   ESOPHAGOGASTRODUODENOSCOPY  06/29/2019   ESOPHAGOGASTRODUODENOSCOPY (EGD) WITH PROPOFOL N/A 06/29/2019   Procedure: ESOPHAGOGASTRODUODENOSCOPY (EGD) WITH PROPOFOL;  Surgeon: Irene Shipper, MD;  Location: [facility];  Service: Endoscopy;  Laterality: N/A;   TEE WITHOUT CARDIOVERSION  04/29/2019   TEE WITHOUT CARDIOVERSION N/A 04/29/2019   Procedure: TRANSESOPHAGEAL ECHOCARDIOGRAM (TEE);  Surgeon: Sanda Klein, MD;  Location: [facility] ENDOSCOPY;  Service: Cardiovascular;  Laterality: N/A;   TUBAL LIGATION      Prior to Admission medications   Medication Sig Start Date End Date Taking? Authorizing Provider  acetaminophen (TYLENOL) 325 MG tablet Take 2 tablets (650 mg total) by mouth every 6 (six) hours as needed for mild pain (or Fever >/= 101). 06/30/19   Guadalupe Dawn, MD  albuterol (VENTOLIN HFA) 108 (90 Base) MCG/ACT inhaler Inhale 2 puffs into the lungs every 4 (four) hours as needed for wheezing or shortness of breath.  06/06/19   [provider]  ALPRAZolam Duanne Moron) 0.5 MG tablet Take 0.5-1 mg by mouth See admin instructions. Take 0.5-1 mg by mouth two times a day (midday and bedtime) 02/14/19   [provider]  amiodarone (PACERONE) 200 MG tablet Take 200 mg by mouth 2 (two) times daily.    [provider]  apixaban (ELIQUIS) 5 MG TABS tablet Take 1 tablet (5 mg total) by mouth 2 (two) times daily. 06/30/19   Guadalupe Dawn, MD  Ascorbic Acid (VITAMIN C) 1000 MG tablet Take 1,000 mg by mouth daily.    [provider]  cetirizine (ZYRTEC) 10 MG tablet Take 10 mg by mouth daily as needed for allergies.     [provider]  cholecalciferol (VITAMIN D3) 25 MCG (1000 UT) tablet Take 1,000 Units  by mouth daily.    [provider]  DULoxetine (CYMBALTA) 60 MG capsule Take 60 mg by mouth 2 (two) times daily. 02/26/19   [provider]  fenofibrate (TRICOR) 48 MG tablet Take 48 mg by mouth at bedtime. 02/26/19   [provider]  furosemide (LASIX) 40 MG tablet Take 1 tablet (40 mg total) by mouth daily. 06/30/19   Guadalupe Dawn, MD  Menthol, Topical Analgesic, (ICY HOT ADVANCED RELIEF EX) Apply 1 application topically 3 (three) times daily as needed (pain).  [provider]  methimazole (TAPAZOLE) 5 MG tablet Take 5 mg by mouth at bedtime.  02/21/19   [provider]  metoprolol succinate (TOPROL-XL) 100 MG 24 hr tablet Take 50 mg by mouth 2 (two) times daily.  06/09/19   [provider]  pantoprazole (PROTONIX) 40 MG tablet Take 1 tablet (40 mg total) by mouth 2 (two) times daily. 06/30/19 06/29/20  Guadalupe Dawn, MD  traMADol (ULTRAM) 50 MG tablet Take 50 mg by mouth 4 (four) times daily as needed (pain).  06/09/19   [provider]    Scheduled Meds:  sodium chloride   Intravenous Once   amiodarone  200 mg Oral BID   methimazole  5 mg Oral QHS   pantoprazole (PROTONIX) IV  40 mg Intravenous Q12H   potassium chloride  20 mEq Oral Once   Infusions:  sodium chloride 10 mL/hr at 07/09/19 0814   potassium chloride 10 mEq (07/09/19 0806)   PRN Meds: acetaminophen **OR** acetaminophen, Muscle Rub   Allergies as of 07/09/2019 - Review Complete 07/09/2019  Allergen Reaction Noted   Ambien [zolpidem tartrate]  04/30/2019    Family History  Problem Relation Age of Onset   Cancer Mother    Heart disease Mother    Hypercholesterolemia Mother    Osteoarthritis Mother    Stroke Mother    Heart disease Father    Hypercholesterolemia Brother     Social History   Socioeconomic History   Marital status: Divorced    Spouse name: Not on file   Number of children: Not on file   Years of education: Not  on file   Highest education level: Not on file  Occupational History   Not on file  Social Needs   Financial resource strain: Not on file   Food insecurity    Worry: Not on file    Inability: Not on file   Transportation needs    Medical: Not on file    Non-medical: Not on file  Tobacco Use   Smoking status: Former Smoker    Packs/day: 2.00    Years: 30.00    Pack years: 60.00    Types: Cigarettes    Quit date: 2001    Years since quitting: 19.8   Smokeless tobacco: Never Used  Substance and Sexual Activity   Alcohol use: Never    Frequency: Never   Drug use: Never   Sexual activity: Not on file  Lifestyle   Physical activity    Days per week: Not on file    Minutes per session: Not on file   Stress: Not on file  Relationships   Social connections    Talks on phone: Not on file    Gets together: Not on file    Attends religious service: Not on file    Active member of club or organization: Not on file    Attends meetings of clubs or organizations: Not on file    Relationship status: Not on file   Intimate partner violence    Fear of current or ex partner: Not on file    Emotionally abused: Not on file    Physically abused: Not on file    Forced sexual activity: Not on file  Other Topics Concern   Not on file  Social History Narrative   Not on file    REVIEW OF SYSTEMS: Constitutional: Weakness.  Falls at home as per HPI. ENT:  No nose bleeds Pulm: No new shortness of breath.  No cough. CV:  No palpitations, no LE edema.  No angina. GU:  No hematuria, no frequency GI: See HPI. Heme: Bruises easily and has several bruises on her arms and legs. Transfusions: See HPI. Neuro:  No headaches, no peripheral tingling or numbness.  Dizziness and some presyncope but no syncope.  No peripheral numbness or tingling.  No loss of sensation.  No blurry vision. Derm: Sores from trauma on her lower legs take months to heal.  She has several sores on her lower  legs. Endocrine:  No sweats or chills.  No polyuria or dysuria Immunization: There are no records of any vaccinations in epic. Travel:  None beyond local counties in last few months.    PHYSICAL EXAM: Vital signs in last 24 hours: Vitals:   07/09/19 0813 07/09/19 0849  BP: (!) 137/46 (!) 140/44  Pulse: 73 70  Resp: 15 15  Temp: 98.1 F (36.7 C) 98.1 F (36.7 C)  SpO2: 100% 100%   Wt Readings from Last 3 Encounters:  07/09/19 62 kg  06/30/19 62 kg  06/12/19 59 kg    General: Chronically ill, pale, doughy appearing WF who is uncomfortable Head: No facial asymmetry.  Bruising on the right upper eyelid. Eyes: No scleral icterus.  Conjunctiva pale.  EOMI. Ears: Not hard of hearing Nose: No congestion, no discharge Mouth: Moist, pink, clear oropharynx.  Many teeth missing.  Tongue midline. Neck: No JVD, no masses, no thyromegaly. Lungs: No labored breathing.  Expiratory wheezes bilaterally.  No cough. Heart: RRR Abdomen: Soft.  Minimal tenderness in the epigastrium.  No guarding or rebound.  No HSM, masses, bruits, hernias..   Rectal: Deferred. Musc/Skeltl: No joint swelling or gross deformity. Extremities: Multiple abrasions, small trauma related lesions and purpura on her arms and legs.  No large hematomas/bruises on her back or limbs. Neurologic: Oriented x3.  Moves all 4 limbs, strength not tested.  No tremor, no asterixis. Skin: Multiple bruises and lesions as described above. Tattoos: None observed. Nodes: No cervical adenopathy. Psych: Mildly anxious but pleasant, mostly calm, cooperative.  Fluid speech.  Good historian.  Intake/Output from previous day: 11/18 0701 - 11/19 0700 In: 1183.3 [I.V.:1000; IV Piggyback:183.3] Out: -  Intake/Output this shift: Total I/O In: 315 [Blood:315] Out: -   LAB RESULTS: Recent Labs    07/09/19 0234 07/09/19 0759  WBC 11.9* 11.1*  HGB 6.0* 5.9*  HCT 19.7* 19.4*  PLT 535* 487*   BMET Lab Results  Component Value Date    NA 136 07/09/2019   NA 131 (L) 07/09/2019   NA 132 (L) 06/30/2019   K 2.9 (L) 07/09/2019   K 2.6 (LL) 07/09/2019   K 5.1 06/30/2019   CL 106 07/09/2019   CL 97 (L) 07/09/2019   CL 102 06/30/2019   CO2 21 (L) 07/09/2019   CO2 20 (L) 07/09/2019   CO2 22 06/30/2019   GLUCOSE 87 07/09/2019   GLUCOSE 115 (H) 07/09/2019   GLUCOSE 117 (H) 06/30/2019   BUN 19 07/09/2019   BUN 24 (H) 07/09/2019   BUN 25 (H) 06/30/2019   CREATININE 1.34 (H) 07/09/2019   CREATININE 1.61 (H) 07/09/2019   CREATININE 1.41 (H) 06/30/2019   CALCIUM 7.8 (L) 07/09/2019   CALCIUM 8.2 (L) 07/09/2019   CALCIUM 8.5 (L) 06/30/2019   LFT Recent Labs    07/09/19 0234  PROT 5.0*  ALBUMIN 2.6*  AST 27  ALT 36  ALKPHOS 84  BILITOT 0.9   PT/INR Lab Results  Component Value  Date   INR 1.2 07/09/2019   Hepatitis Panel No results for input(s): HEPBSAG, HCVAB, HEPAIGM, HEPBIGM in the last 72 hours. C-Diff No components found for: CDIFF Lipase     Component Value Date/Time   LIPASE 23 06/28/2019 1830    Drugs of Abuse  No results found for: LABOPIA, COCAINSCRNUR, LABBENZ, AMPHETMU, THCU, LABBARB   RADIOLOGY STUDIES: Ct Head Wo Contrast  Result Date: 07/09/2019 CLINICAL DATA:  Altered level of consciousness, unexplained EXAM: CT HEAD WITHOUT CONTRAST TECHNIQUE: Contiguous axial images were obtained from the base of the skull through the vertex without intravenous contrast. COMPARISON:  05/01/2019 FINDINGS: Brain: No evidence of acute infarction, hemorrhage, hydrocephalus, extra-axial collection or mass lesion/mass effect. Mild white matter disease better seen by interval brain MRI. Normal brain volume. Vascular: No hyperdense vessel or unexpected calcification. Skull: Normal. Negative for fracture or focal lesion. Sinuses/Orbits: No acute finding. Other: Artifact from temperature probe over the forehead. IMPRESSION: No acute or interval finding. Electronically Signed   By: Monte Fantasia M.D.   On: 07/09/2019  04:42   Dg Chest Port 1 View  Result Date: 07/09/2019 CLINICAL DATA:  Shortness of breath EXAM: PORTABLE CHEST 1 VIEW COMPARISON:  Radiograph 06/28/2019 FINDINGS: The cardiomediastinal contours are unchanged with borderline cardiomegaly. The lungs are clear. Pulmonary vasculature is normal. No consolidation, pleural effusion, or pneumothorax. No acute osseous abnormalities are seen. IMPRESSION: No acute findings. Electronically Signed   By: Keith Rake M.D.   On: 07/09/2019 04:21      IMPRESSION:   *   GI bleed.  Suspect the gastric ulcers as a source.  *   Blood loss anemia.   Recurrent.  Receiving 1 of 1 PRBCs now.  *   Weakness and multiple falls.  Head CT negative for trauma C/O no back pain.  Has not had any radiologic imaging of her low back. Uses Xanax regularly at nighttime for sleep aid.  *    Paroxysmal atrial fibrillation.  Chronic anticoagulation.  Switched from Xarelto to Eliquis 10 days ago.  Last dose of Eliquis was 11/18 in the morning.  *    Hypokalemia.  IV runs potassium ordered.  *   Covid 19 negative  PLAN:     *    EGD set up for 845 tomorrow morning.  This will allow time for any additional transfusions, washout of Eliquis.  *   H&H at 1 PM today.  CBC in the morning.   Azucena Freed  07/09/2019, 9:46 AM Phone [phone]  I have reviewed the entire case in detail with the above APP and discussed the plan in detail.  Therefore, I agree with the diagnoses recorded above. In addition,  I have personally interviewed and examined the patient and have personally reviewed any abdominal/pelvic CT scan images. I also reviewed Dr. Blanch Media recent EGD report.  My additional thoughts are as follows:  This seems to be small bowel bleeding, it is not clear if the ulcers found on recent EGD were the culprit given their size and clean base, especially now in light of recurrent bleeding.  Perhaps they were the source and bleeding recurred because of her oral  anticoagulation.  Nevertheless, I wonder about a more distal small bowel source.  She reports having had a colonoscopy with Dr. Melina Copa in [place] within the last 1 to 2 years.  When I first heard about her, we were planning an upper endoscopy for tomorrow.  I now would like to do a small  bowel enteroscopy instead.  I discussed that with her and just explained that it is the same procedure only with a longer scope going further into the small bowel.  Her nurse was present for the entire encounter and she was agreeable after discussion of procedure and risks.  The benefits and risks of the planned procedure were described in detail with the patient or (when appropriate) their health care proxy.  Risks were outlined as including, but not limited to, bleeding, infection, perforation, adverse medication reaction leading to cardiac or pulmonary decompensation, pancreatitis (if ERCP).  The limitation of incomplete mucosal visualization was also discussed.  No guarantees or warranties were given.  Patient at increased risk for cardiopulmonary complications of procedure due to medical comorbidities.     [name] Office:[phone]

## 2019-07-09 NOTE — H&P (Addendum)
[facility] Admission History and Physical Service Pager: [phone]  Patient name: Brianna Porter Medical record number: [id number] Date of birth: 1961/06/01 Age: 58 y.o. Gender: female  Primary Care Provider: Cher Nakai, MD Consultants: GI  Code Status: Full Preferred Emergency Contact: Daughter, Albina Billet [phone]  Chief Complaint: Fall, dark stools   Assessment and Plan: Brianna Porter is a 58 y.o. female presenting after a fall and dark stools at home. PMH is significant for history of GI bleed, HFrEF, paroxysmal A FIB, hyperthyroidism, anxiety,   Symptomatic anemia  hx of GI bleed  History limited as patient is intermittently altered status-post 2 Xanax taken prior to arrival.  Patient reported black stools and denied bright red blood per rectum.  Patient on Eliquis.  Has fallen at least twice since last admission (06/28/19).  She has multiple abrasions and ecchymoses on her extremities and face.  Patient with multiple falls.  Fall prior to arrival where she hit head.  Chest x-ray was unremarkable.  CT head was negative for acute bleed.  Hemoglobin on admission 6.0 and will receive 1 unit pRBC.  Vital signs stable.  Patient pale on exam and is satting well on room air.  FOBT pending.  Given patient's reported dark stools likely an upper GI bleed.  Upper endoscopy performed on 06/29/2019 indicated for nonbleeding superficial gastric ulcers without stigmata in the gastric antrum.  H. pylori testing was negative.  Will consult GI in the morning and follow-up on posttransfusion H&H. -Admit to cardiac telemetry, attending Dr. Ardelia Mems -Consult GI in AM -Hold Eliquis -NO NSAIDs -Transfuse 1 unit PRBCs, H&H post-transfusion -Consult GI in a.m. -IV Protonix twice daily -Follow-up FOBT -Q12H CBC  AMS On arrival patient altered and unable to answer most of her questions.  Does report taking 1 mg of Xanax prior to arrival and reports that she is just sleepy.   Patient is AO x3 but is very sleepy and hard to awaken. Patient refusing to answer most questions.  Head CT negative for any signs of acute bleed after having multiple falls at home.  Could be secondary to Xanax usage, LFTs wnl, BUN mildly elevated at 24, no signs of infection, limited neuro exam in room with no signs of focal change. -Continue to monitor for improvement of mental status  -CIWA protocol given benzo use -Decrease Xanax on discharge, would likely benefit from coming off completely -PT/OT eval and treat  Hypokalemia Admission potassium 2.6. Replete with IV potassium and oral K in ED.  - Replete as needed  - Daily BMP  Anxiety  Home medications include Xanax 0.5 mg twice daily and Cymbalta 60 mg twice daily.  Patient took 2 of her Xanax prior to arrival and had limited participation in exam. -Hold Cymbalta until creatinine returns to baseline -Hold Xanax as patient is NPO, consider decreasing dose as patient was altered on admission likely due to Xanax  AKI on CKD stage III Creatinine 1.61 on admission.  Baseline 0.8-0.9. May be patient's new baseline. she takes Lasix 40 mg daily. Patient received 1 L bolus in the ED for low BP. monitor for signs of fluid overload given the patient also has HFrEF.  -Avoid nephrotoxic agents -Daily BMP -Encourage oral hydration -Hold home Lasix  HFrEF TEE 04/2019 LVEF 20-25%, left ventricle with severely reduced systolic function and global hypokinesis without regional wall motion abnormalities. Repeat echo is planned in 3-6 months according to cardiology notes.  Patient sees Dr. Shirlee More, cardiologist.  Patient reports dry weight  130 pounds.  On exam, there are no signs of volume overload.  Patient did receive 1 L bolus in the ED. -Monitor volume status after receiving bolus -Daily weights -Strict I's and O's -Hold Lasix with AKI  Paroxysmal A. Fib with H/O Cardioversion Home medications include Eliquis, metoprolol, amiodarone.   Patient has follow-up visit with Dr. Bettina Gavia on 07/14/2019.    AST and ALT are within normal limits. -Follow up EKG  -Hold Eliquis for acute GI bleed. -Restart in AM amiodarone 200 mg twice daily, hold if MAP<60 -Restart metoprolol 50 mg twice daily, hold if MAP<60  Hyperthyroidism Asymptomatic.  Home medication methimazole 5 mg nightly -Restart in PM methimazole  Osteoarthritis Patient has been taking tylenol, ibuprofen 400 mg daily long term, and tramadol 50 mg four times daily PRN. -Continue tylenol 650 mg Q6H PRN -Continue tramadol 50 mg Q12H PRN for pain      FEN/GI: Replete electrolytes as needed, NPO pending GI evaluation Prophylaxis: SCDs  Disposition: Cardiac telemetry admission  History of Present Illness:  Brianna Porter is a 58 y.o. female presenting after a fall. Patient was in her bathroom when she fell. She took 2 xanax prior to coming and states that she is sleepy and is not able to answer most of our questions. She does endorse having dark stools and denies bright red blood per rectum. She has a nasal cannula on but is satting 100% and it is not connected. She has multiple bruises on her face and extremities.  She says that she does not have a headache but says she has back pain.   Review Of Systems: Per HPI with the following additions:   Review of Systems  Unable to perform ROS: Mental status change  Patient took 2 Xanax prior to arrival.  We will reassess ROS when patient is less somnolent.  Patient Active Problem List   Diagnosis Date Noted  . Acute gastric ulcer with hemorrhage   . Symptomatic anemia 06/28/2019  . Elevated liver function tests   . NICM (nonischemic cardiomyopathy) ([place])   . Mild CAD   . Confusion   . Diabetes ([place])   . Iron deficiency anemia   . Hyperthyroidism   . Altered mental status 05/07/2019  . Acute systolic heart failure ([place]) 05/07/2019  . AKI (acute kidney injury) ([place]) 05/07/2019  . Hyponatremia   . CAD (coronary artery  disease)   . Essential hypertension   . Hypokalemia   . Prolonged Q-T interval on ECG 04/30/2019  . Atrial fibrillation with rapid ventricular response ([place]) 04/29/2019  . Persistent atrial fibrillation ([place])   . Hyperlipidemia 04/23/2019  . Anemia 04/23/2019  . CKD (chronic kidney disease) stage 3, GFR 30-59 ml/min 04/23/2019  . Depression 03/02/2019  . Osteoarthritis 03/02/2019  . Type 2 diabetes mellitus with complication, with long-term current use of insulin ([place]) 03/02/2019  . Hypertensive heart disease 03/02/2019    Past Medical History: Past Medical History:  Diagnosis Date  . Anxiety and depression   . Atrial flutter ([place])   . Chronic systolic CHF (congestive heart failure) ([facility])    a. dx in setting of atrial fib/flutter - possibly tachy mediated. Coronary CTA with only mild CAD in 04/2019.  Marland Kitchen CKD (chronic kidney disease), stage II   . Confusion    a. persistent confusion during 04/2019 admission of unclear cause. Home meds adjusted. CT/MRI brain nonacute.  . Diabetes ([place])   . Edema   . Elevated liver function tests   . Hypercholesteremia   .  Hyperkalemia   . Hypertension   . Hypertensive heart and chronic kidney disease with systolic congestive heart failure ([place])   . Hyperthyroidism   . Hypokalemia   . Hypokalemia   . Hypomagnesemia   . Hyponatremia   . Iron deficiency anemia   . Mild CAD    a. Coronary CT 04/2019 - Minimal, Non-obstructive CAD.  Marland Kitchen NICM (nonischemic cardiomyopathy) ([place])   . Persistent atrial fibrillation ([place])   . Prolonged QT interval     Past Surgical History: Past Surgical History:  Procedure Laterality Date  . BIOPSY  06/29/2019   Procedure: BIOPSY;  Surgeon: Irene Shipper, MD;  Location: [place];  Service: Endoscopy;;  . CARDIOVERSION N/A 04/29/2019   Procedure: CARDIOVERSION;  Surgeon: Sanda Klein, MD;  Location: [facility] ENDOSCOPY;  Service: Cardiovascular;  Laterality: N/A;  . CARDIOVERSION N/A 05/04/2019   Procedure:  CARDIOVERSION;  Surgeon: Josue Hector, MD;  Location: [facility] ENDOSCOPY;  Service: Cardiovascular;  Laterality: N/A;  . ESOPHAGOGASTRODUODENOSCOPY  06/29/2019  . ESOPHAGOGASTRODUODENOSCOPY (EGD) WITH PROPOFOL N/A 06/29/2019   Procedure: ESOPHAGOGASTRODUODENOSCOPY (EGD) WITH PROPOFOL;  Surgeon: Irene Shipper, MD;  Location: [facility] ENDOSCOPY;  Service: Endoscopy;  Laterality: N/A;  . TEE WITHOUT CARDIOVERSION  04/29/2019  . TEE WITHOUT CARDIOVERSION N/A 04/29/2019   Procedure: TRANSESOPHAGEAL ECHOCARDIOGRAM (TEE);  Surgeon: Sanda Klein, MD;  Location: [facility] ENDOSCOPY;  Service: Cardiovascular;  Laterality: N/A;  . TUBAL LIGATION      Social History: Social History   Tobacco Use  . Smoking status: Former Smoker    Packs/day: 2.00    Years: 30.00    Pack years: 60.00    Types: Cigarettes    Quit date: 2001    Years since quitting: 19.8  . Smokeless tobacco: Never Used  Substance Use Topics  . Alcohol use: Never    Frequency: Never  . Drug use: Never   Additional social history:  Please also refer to relevant sections of EMR.  Family History: Family History  Problem Relation Age of Onset  . Cancer Mother   . Heart disease Mother   . Hypercholesterolemia Mother   . Osteoarthritis Mother   . Stroke Mother   . Heart disease Father   . Hypercholesterolemia Brother      Allergies and Medications: Allergies  Allergen Reactions  . Ambien [Zolpidem Tartrate]     Pt and family state this med makes the patient sleepwalk.   No current facility-administered medications on file prior to encounter.    Current Outpatient Medications on File Prior to Encounter  Medication Sig Dispense Refill  . acetaminophen (TYLENOL) 325 MG tablet Take 2 tablets (650 mg total) by mouth every 6 (six) hours as needed for mild pain (or Fever >/= 101). 30 tablet 0  . albuterol (VENTOLIN HFA) 108 (90 Base) MCG/ACT inhaler Inhale 2 puffs into the lungs every 4 (four) hours as needed for wheezing or shortness of  breath.     . ALPRAZolam (XANAX) 0.5 MG tablet Take 0.5-1 mg by mouth See admin instructions. Take 0.5-1 mg by mouth two times a day (midday and bedtime)    . amiodarone (PACERONE) 200 MG tablet Take 200 mg by mouth 2 (two) times daily.    Marland Kitchen apixaban (ELIQUIS) 5 MG TABS tablet Take 1 tablet (5 mg total) by mouth 2 (two) times daily. 60 tablet 0  . Ascorbic Acid (VITAMIN C) 1000 MG tablet Take 1,000 mg by mouth daily.    . cetirizine (ZYRTEC) 10 MG tablet Take 10 mg by  mouth daily as needed for allergies.     . cholecalciferol (VITAMIN D3) 25 MCG (1000 UT) tablet Take 1,000 Units by mouth daily.    . DULoxetine (CYMBALTA) 60 MG capsule Take 60 mg by mouth 2 (two) times daily.    . fenofibrate (TRICOR) 48 MG tablet Take 48 mg by mouth at bedtime.    . furosemide (LASIX) 40 MG tablet Take 1 tablet (40 mg total) by mouth daily. 30 tablet 0  . Menthol, Topical Analgesic, (ICY HOT ADVANCED RELIEF EX) Apply 1 application topically 3 (three) times daily as needed (pain).     . methimazole (TAPAZOLE) 5 MG tablet Take 5 mg by mouth at bedtime.     . metoprolol succinate (TOPROL-XL) 100 MG 24 hr tablet Take 50 mg by mouth 2 (two) times daily.     . pantoprazole (PROTONIX) 40 MG tablet Take 1 tablet (40 mg total) by mouth 2 (two) times daily. 60 tablet 0  . traMADol (ULTRAM) 50 MG tablet Take 50 mg by mouth 4 (four) times daily as needed (pain).       Objective: BP 103/77   Pulse 62   Temp (!) 97.3 F (36.3 C) (Oral)   Resp 12   Wt 62 kg   LMP  (LMP Unknown) Comment: ?10 years ago?  SpO2 (!) 86%   BMI 24.21 kg/m  Exam:  GEN:     Somnolent but responsive to verbal commands, pallor present, in no acute distress HENT:   Normocephalic, nares patent, no nasal discharge, multiple facial ecchymoses and abrasions present EYES:   pupils equal and reactive NECK:  supple, normal ROM RESP:  clear to auscultation bilaterally, no increased work of breathing CVS:   regular rate and rhythm, distal pulses  intact  ABD:  soft, non-tender; bowel sounds present; no palpable masses EXT:   normal ROM, multiple upper and lower extremity ecchymoses and abrasions,  NEURO: Slurred yet comprehensible speech, somnolent, responsive to verbal commands Skin:   pallor, warm and dry, see above    Labs and Imaging: CBC BMET  Recent Labs  Lab 07/09/19 0234  WBC 11.9*  HGB 6.0*  HCT 19.7*  PLT 535*   Recent Labs  Lab 07/09/19 0234  NA 131*  K 2.6*  CL 97*  CO2 20*  BUN 24*  CREATININE 1.61*  GLUCOSE 115*  CALCIUM 8.2*      Ct Head Wo Contrast  Result Date: 07/09/2019 CLINICAL DATA:  Altered level of consciousness, unexplained EXAM: CT HEAD WITHOUT CONTRAST TECHNIQUE: Contiguous axial images were obtained from the base of the skull through the vertex without intravenous contrast. COMPARISON:  05/01/2019 FINDINGS: Brain: No evidence of acute infarction, hemorrhage, hydrocephalus, extra-axial collection or mass lesion/mass effect. Mild white matter disease better seen by interval brain MRI. Normal brain volume. Vascular: No hyperdense vessel or unexpected calcification. Skull: Normal. Negative for fracture or focal lesion. Sinuses/Orbits: No acute finding. Other: Artifact from temperature probe over the forehead. IMPRESSION: No acute or interval finding. Electronically Signed   By: Monte Fantasia M.D.   On: 07/09/2019 04:42   Dg Chest Port 1 View  Result Date: 07/09/2019 CLINICAL DATA:  Shortness of breath EXAM: PORTABLE CHEST 1 VIEW COMPARISON:  Radiograph 06/28/2019 FINDINGS: The cardiomediastinal contours are unchanged with borderline cardiomegaly. The lungs are clear. Pulmonary vasculature is normal. No consolidation, pleural effusion, or pneumothorax. No acute osseous abnormalities are seen. IMPRESSION: No acute findings. Electronically Signed   By: Keith Rake M.D.   On: 07/09/2019  04:21    Lyndee Hensen, MD 07/09/2019, 3:45 AM PGY-1, [place] Intern pager:  [phone], text pages welcome FPTS Upper-Level Resident Addendum   I have independently interviewed and examined the patient. I have discussed the above with the original author and agree with their documentation. My edits for correction/addition/clarification are in purple. Please see also any attending notes.    [place] Jamilex Bohnsack, DO PGY-3, [facility] 07/09/2019 7:44 AM  FPTS Service pager: [phone] (text pages welcome through [facility])

## 2019-07-09 NOTE — Telephone Encounter (Signed)
Phoned patient who states she is in [facility] with a GI Bleed. She ran out of Pacerone and needs a refill, states she's been getting it in the hospital however.  States she doesn't answer phone or check messages because she gets so many junk calls and did not get our call requesting dose clarification.  States she's taking pacerone 200 mg, 1 tab. Twice daily.  Refill sent to pharmacy. Instructed to call our office in the future if she needs refills and is unable to get from Pharmacy.  07/14/19 f/u appt scheduled with Dr. Bettina Gavia, she will call our office if she needs to reschedule.  Verbalizes understanding of above.

## 2019-07-09 NOTE — ED Provider Notes (Signed)
[facility] EMERGENCY DEPARTMENT Provider Note   CSN: [id number] Arrival date & time: 07/09/19  0131     History   Chief Complaint Chief Complaint  Patient presents with   Fall    HPI Brianna Porter is a 58 y.o. female with a history of atrial fibrillation on apixaban who presents to the emergency department with a chief complaint of fall.  The patient reports that she was using the toilet tonight prior to arrival when she fell backwards and hit her back on wall behind the toilet.  She adamantly denies hitting her head tonight.  States that she did not pass out.  She denies having a headache at this time.  She is endorsing severe back pain to the mid and lower back.  No neck pain.  She is a covered in bruises and notably has a bruise above the left eyebrow that she states is from a fall on 11/13.   She has had 4-5 black bowel movements since yesterday.  She denies abdominal pain, nausea, vomiting, chest pain, shortness of breath, dizziness, lightheadedness.  She also reports that she took 1 mg of her nighttime dose of Xanax just prior to arrival.  She is very drowsy and slurring her words, but is alert and oriented to person, year, and location.  Denies taking recent NSAIDs since discharge.   She had a recent admission on November 8-10 for symptomatic anemia with concern for GI bleed after she was found to have a hemoglobin of 6.5, improved to 7.6 at discharge.  She had an upper endoscopy by GI that revealed for gastric, nonbleeding ulcers.  Her Xarelto was changed to apixaban at discharge. She reports compliant with his medication.     The history is provided by the patient. No language interpreter was used.    Past Medical History:  Diagnosis Date   Anxiety and depression    Atrial flutter (HCC)    Chronic systolic CHF (congestive heart failure) ([place])    a. dx in setting of atrial fib/flutter - possibly tachy mediated. Coronary CTA with only mild CAD in  04/2019.   CKD (chronic kidney disease), stage II    Confusion    a. persistent confusion during 04/2019 admission of unclear cause. Home meds adjusted. CT/MRI brain nonacute.   Diabetes ([place])    Edema    Elevated liver function tests    Hypercholesteremia    Hyperkalemia    Hypertension    Hypertensive heart and chronic kidney disease with systolic congestive heart failure (HCC)    Hyperthyroidism    Hypokalemia    Hypokalemia    Hypomagnesemia    Hyponatremia    Iron deficiency anemia    Mild CAD    a. Coronary CT 04/2019 - Minimal, Non-obstructive CAD.   NICM (nonischemic cardiomyopathy) ([place])    Persistent atrial fibrillation (HCC)    Prolonged QT interval     Patient Active Problem List   Diagnosis Date Noted   Acute gastric ulcer with hemorrhage    Symptomatic anemia 06/28/2019   Elevated liver function tests    NICM (nonischemic cardiomyopathy) (HCC)    Mild CAD    Confusion    Diabetes ([place])    Iron deficiency anemia    Hyperthyroidism    Altered mental status [id number]   Acute systolic heart failure ([place]) 05/07/2019   AKI (acute kidney injury) ([place]) 05/07/2019   Hyponatremia    CAD (coronary artery disease)    Essential hypertension  Hypokalemia    Prolonged Q-T interval on ECG 04/30/2019   Atrial fibrillation with rapid ventricular response ([place]) 04/29/2019   Persistent atrial fibrillation ([place])    Hyperlipidemia 04/23/2019   Anemia 04/23/2019   CKD (chronic kidney disease) stage 3, GFR 30-59 ml/min 04/23/2019   Depression 03/02/2019   Osteoarthritis 03/02/2019   Type 2 diabetes mellitus with complication, without long-term current use of insulin ([place]) 03/02/2019   Hypertensive heart disease 03/02/2019    Past Surgical History:  Procedure Laterality Date   BIOPSY  06/29/2019   Procedure: BIOPSY;  Surgeon: Irene Shipper, MD;  Location: [facility];  Service: Endoscopy;;   CARDIOVERSION N/A 04/29/2019    Procedure: CARDIOVERSION;  Surgeon: Sanda Klein, MD;  Location: [facility] ENDOSCOPY;  Service: Cardiovascular;  Laterality: N/A;   CARDIOVERSION N/A 05/04/2019   Procedure: CARDIOVERSION;  Surgeon: Josue Hector, MD;  Location: [facility] ENDOSCOPY;  Service: Cardiovascular;  Laterality: N/A;   ESOPHAGOGASTRODUODENOSCOPY  06/29/2019   ESOPHAGOGASTRODUODENOSCOPY (EGD) WITH PROPOFOL N/A 06/29/2019   Procedure: ESOPHAGOGASTRODUODENOSCOPY (EGD) WITH PROPOFOL;  Surgeon: Irene Shipper, MD;  Location: [facility] ENDOSCOPY;  Service: Endoscopy;  Laterality: N/A;   TEE WITHOUT CARDIOVERSION  04/29/2019   TEE WITHOUT CARDIOVERSION N/A 04/29/2019   Procedure: TRANSESOPHAGEAL ECHOCARDIOGRAM (TEE);  Surgeon: Sanda Klein, MD;  Location: [facility] ENDOSCOPY;  Service: Cardiovascular;  Laterality: N/A;   TUBAL LIGATION       OB History   No obstetric history on file.      Home Medications    Prior to Admission medications   Medication Sig Start Date End Date Taking? Authorizing Provider  acetaminophen (TYLENOL) 325 MG tablet Take 2 tablets (650 mg total) by mouth every 6 (six) hours as needed for mild pain (or Fever >/= 101). 06/30/19   Guadalupe Dawn, MD  albuterol (VENTOLIN HFA) 108 (90 Base) MCG/ACT inhaler Inhale 2 puffs into the lungs every 4 (four) hours as needed for wheezing or shortness of breath.  06/06/19   [provider]  ALPRAZolam Duanne Moron) 0.5 MG tablet Take 0.5-1 mg by mouth See admin instructions. Take 0.5-1 mg by mouth two times a day (midday and bedtime) 02/14/19   [provider]  amiodarone (PACERONE) 200 MG tablet Take 200 mg by mouth 2 (two) times daily.    [provider]  apixaban (ELIQUIS) 5 MG TABS tablet Take 1 tablet (5 mg total) by mouth 2 (two) times daily. 06/30/19   Guadalupe Dawn, MD  Ascorbic Acid (VITAMIN C) 1000 MG tablet Take 1,000 mg by mouth daily.    [provider]  cetirizine (ZYRTEC) 10 MG tablet Take 10 mg by mouth daily as needed for  allergies.     [provider]  cholecalciferol (VITAMIN D3) 25 MCG (1000 UT) tablet Take 1,000 Units by mouth daily.    [provider]  DULoxetine (CYMBALTA) 60 MG capsule Take 60 mg by mouth 2 (two) times daily. 02/26/19   [provider]  fenofibrate (TRICOR) 48 MG tablet Take 48 mg by mouth at bedtime. 02/26/19   [provider]  furosemide (LASIX) 40 MG tablet Take 1 tablet (40 mg total) by mouth daily. 06/30/19   Guadalupe Dawn, MD  Menthol, Topical Analgesic, (ICY HOT ADVANCED RELIEF EX) Apply 1 application topically 3 (three) times daily as needed (pain).     [provider]  methimazole (TAPAZOLE) 5 MG tablet Take 5 mg by mouth at bedtime.  02/21/19   [provider]  metoprolol succinate (TOPROL-XL) 100 MG 24 hr  tablet Take 50 mg by mouth 2 (two) times daily.  06/09/19   [provider]  pantoprazole (PROTONIX) 40 MG tablet Take 1 tablet (40 mg total) by mouth 2 (two) times daily. 06/30/19 06/29/20  Guadalupe Dawn, MD  traMADol (ULTRAM) 50 MG tablet Take 50 mg by mouth 4 (four) times daily as needed (pain).  06/09/19   [provider]    Family History Family History  Problem Relation Age of Onset   Cancer Mother    Heart disease Mother    Hypercholesterolemia Mother    Osteoarthritis Mother    Stroke Mother    Heart disease Father    Hypercholesterolemia Brother     Social History Social History   Tobacco Use   Smoking status: Former Smoker    Packs/day: 2.00    Years: 30.00    Pack years: 60.00    Types: Cigarettes    Quit date: 2001    Years since quitting: 19.8   Smokeless tobacco: Never Used  Substance Use Topics   Alcohol use: Never    Frequency: Never   Drug use: Never     Allergies   Ambien [zolpidem tartrate]   Review of Systems Review of Systems  Constitutional: Negative for activity change, chills, diaphoresis and fever.  Respiratory: Negative for cough, shortness of  breath and wheezing.   Cardiovascular: Negative for chest pain.  Gastrointestinal: Positive for anal bleeding. Negative for abdominal pain, blood in stool, constipation, nausea and vomiting.  Genitourinary: Negative for dysuria.  Musculoskeletal: Positive for arthralgias, back pain and myalgias. Negative for gait problem, joint swelling, neck pain and neck stiffness.  Skin: Positive for color change. Negative for rash.  Allergic/Immunologic: Negative for immunocompromised state.  Neurological: Negative for dizziness, seizures, syncope, weakness, numbness and headaches.  Hematological: Does not bruise/bleed easily.  Psychiatric/Behavioral: Negative for confusion.   Physical Exam Updated Vital Signs BP (!) 154/52    Pulse 74    Temp (!) 97.3 F (36.3 C) (Oral)    Resp 12    Wt 62 kg    LMP  (LMP Unknown) Comment: ?10 years ago?   SpO2 100%    BMI 24.21 kg/m   Physical Exam Vitals signs and nursing note reviewed.  Constitutional:      General: She is not in acute distress.    Appearance: She is not diaphoretic.     Comments: Chronically ill appearing. Appears much older than stated age.   HENT:     Head: Normocephalic. No right periorbital erythema or left periorbital erythema.     Comments: No hematoma or wounds noted to the scalp.     Mouth/Throat:     Comments: Lips are dry and cracked.  Eyes:     Extraocular Movements: Extraocular movements intact.     Conjunctiva/sclera: Conjunctivae normal.     Pupils: Pupils are equal, round, and reactive to light.  Neck:     Musculoskeletal: Neck supple.  Cardiovascular:     Rate and Rhythm: Normal rate and regular rhythm.     Heart sounds: No murmur. No friction rub. No gallop.   Pulmonary:     Effort: Pulmonary effort is normal. No respiratory distress.     Breath sounds: No stridor. No wheezing, rhonchi or rales.  Abdominal:     General: There is no distension.     Palpations: Abdomen is soft. There is no mass.     Tenderness: There  is no abdominal tenderness. There is no right CVA tenderness,  left CVA tenderness, guarding or rebound.     Hernia: No hernia is present.     Comments: Abdomen is soft, nontender, nondistended.  Musculoskeletal:     Right lower leg: No edema.     Left lower leg: No edema.     Comments: TTP to the spinous processes of the thoracic and lumbar spine, No crepitus or step offs. TTP to the right, posterior, inferior ribs without crepitus or step offs.  Skin:    General: Skin is warm.     Coloration: Skin is pale. Skin is not jaundiced.     Findings: No rash.     Comments: Multiple bruises noted throughout, particularly to the upper and lower extremities. She has a bruise above her left eye that appears several days old.   Neurological:     Mental Status: She is alert.     Comments: Drowsy, but opens her eyes and responds in complete, appropriate sentences to loud voice.  Speak is somewhat slurred. She is oriented to name, year, and hospital.  She is able to give a coherent history.  Moves all 4 extremities spontaneously.  No weakness noted of the upper or lower extremities.  Sensation is intact to the bilateral upper and lower extremities.   Psychiatric:        Behavior: Behavior is uncooperative.    ED Treatments / Results  Labs (all labs ordered are listed, but only abnormal results are displayed) Labs Reviewed  COMPREHENSIVE METABOLIC PANEL - Abnormal; Notable for the following components:      Result Value   Sodium 131 (*)    Potassium 2.6 (*)    Chloride 97 (*)    CO2 20 (*)    Glucose, Bld 115 (*)    BUN 24 (*)    Creatinine, Ser 1.61 (*)    Calcium 8.2 (*)    Total Protein 5.0 (*)    Albumin 2.6 (*)    GFR calc non Af Amer 35 (*)    GFR calc Af Amer 40 (*)    All other components within normal limits  CBC WITH DIFFERENTIAL/PLATELET - Abnormal; Notable for the following components:   WBC 11.9 (*)    RBC 1.99 (*)    Hemoglobin 6.0 (*)    HCT 19.7 (*)    RDW 19.1 (*)     Platelets 535 (*)    nRBC 0.3 (*)    Neutro Abs 10.2 (*)    Abs Immature Granulocytes 0.18 (*)    All other components within normal limits  BRAIN NATRIURETIC PEPTIDE - Abnormal; Notable for the following components:   B Natriuretic Peptide 144.1 (*)    All other components within normal limits  SARS CORONAVIRUS 2 (TAT 6-24 HRS)  PROTIME-INR  URINALYSIS, ROUTINE W REFLEX MICROSCOPIC  MAGNESIUM  BASIC METABOLIC PANEL  HEMOGLOBIN AND HEMATOCRIT, BLOOD  POC OCCULT BLOOD, ED  TYPE AND SCREEN  PREPARE RBC (CROSSMATCH)    EKG None  Radiology Ct Head Wo Contrast  Result Date: 07/09/2019 CLINICAL DATA:  Altered level of consciousness, unexplained EXAM: CT HEAD WITHOUT CONTRAST TECHNIQUE: Contiguous axial images were obtained from the base of the skull through the vertex without intravenous contrast. COMPARISON:  05/01/2019 FINDINGS: Brain: No evidence of acute infarction, hemorrhage, hydrocephalus, extra-axial collection or mass lesion/mass effect. Mild white matter disease better seen by interval brain MRI. Normal brain volume. Vascular: No hyperdense vessel or unexpected calcification. Skull: Normal. Negative for fracture or focal lesion. Sinuses/Orbits: No acute finding. Other: Artifact  from temperature probe over the forehead. IMPRESSION: No acute or interval finding. Electronically Signed   By: Monte Fantasia M.D.   On: 07/09/2019 04:42   Dg Chest Port 1 View  Result Date: 07/09/2019 CLINICAL DATA:  Shortness of breath EXAM: PORTABLE CHEST 1 VIEW COMPARISON:  Radiograph 06/28/2019 FINDINGS: The cardiomediastinal contours are unchanged with borderline cardiomegaly. The lungs are clear. Pulmonary vasculature is normal. No consolidation, pleural effusion, or pneumothorax. No acute osseous abnormalities are seen. IMPRESSION: No acute findings. Electronically Signed   By: Keith Rake M.D.   On: 07/09/2019 04:21    Procedures .Critical Care Performed by: Joanne Gavel,  PA-C Authorized by: Joanne Gavel, PA-C   Critical care provider statement:    Critical care time (minutes):  50   Critical care time was exclusive of:  Separately billable procedures and treating other patients and teaching time   Critical care was necessary to treat or prevent imminent or life-threatening deterioration of the following conditions:  Circulatory failure   Critical care was time spent personally by me on the following activities:  Ordering and performing treatments and interventions, ordering and review of laboratory studies, review of old charts, re-evaluation of patient's condition, obtaining history from patient or surrogate, examination of patient and development of treatment plan with patient or surrogate   I assumed direction of critical care for this patient from another provider in my specialty: no     (including critical care time)  Medications Ordered in ED Medications  sodium chloride 0.9 % bolus 1,000 mL (1,000 mLs Intravenous New Bag/Given 07/09/19 0456)    And  0.9 %  sodium chloride infusion (has no administration in time range)  pantoprazole (PROTONIX) 80 mg in sodium chloride 0.9 % 250 mL (0.32 mg/mL) infusion (8 mg/hr Intravenous New Bag/Given 07/09/19 0301)  0.9 %  sodium chloride infusion (Manually program via Guardrails IV Fluids) (has no administration in time range)  potassium chloride 10 mEq in 100 mL IVPB (10 mEq Intravenous New Bag/Given 07/09/19 0456)  potassium chloride SA (KLOR-CON) CR tablet 20 mEq (20 mEq Oral Not Given 07/09/19 0457)  pantoprazole (PROTONIX) 80 mg in sodium chloride 0.9 % 100 mL IVPB (0 mg Intravenous Stopped 07/09/19 0318)     Initial Impression / Assessment and Plan / ED Course  I have reviewed the triage vital signs and the nursing notes.  Pertinent labs & imaging results that were available during my care of the patient were reviewed by me and considered in my medical decision making (see chart for details).         58 year old female with a history of atrial fibrillation on apixaban, CKD stage III, diabetes mellitus type 2, HTN, HLD, prolonged QT, nonischemic cardiomyopathy, acute systolic heart failure (EF 20-25%), gastric ulcers with hemorrhage who presents to the emergency department with a chief complaint of melena.  She has had 4-5 episodes of melena since yesterday.  She has been compliant with her home apixaban since she was discharged on 11/10.  She also reports of 2 falls, one on 11/13 where she hit her head and obtained a bruise over her left eyebrow.  Tonight, she reports that she fell backwards while attempting to use the toilet and hit her back on the wall, but adamantly denies hitting her head or syncopized.  Of note, the patient reports that she took 1 mg of "her nighttime" Xanax prior to arrival. The patient was discussed with Dr. Stark Jock, attending physician.   Patient's blood pressure on  arrival was 88/40, but was 70s/50s on the monitor on my initial evaluation with MAP of 58.  She was given a 1 L IV fluid bolus and hypotension has resolved.  She was started on maintenance fluids, but this was decreased to [facility] to avoid [facility] overload given the patient's EF of 20 to 25%.  Following fluid bolus, patient's mentation has also started to improve as she was very somnolent, but arousable to loud voice on initial exam.  She has remained alert and oriented x3 since arrival.  She is currently on 2 L nasal cannula as she was hypoxic at 86% on arrival to the ER.  I suspect this may be secondary to Xanax use as lungs are clear on exam.  Labs are also notable for hemoglobin of 6.0.  She has had multiple recurrent known GI bleeds.  Patient consented to transfusion of RBCs and 1 unit has been ordered.    She is also hypokalemic to 2.6.  IV and oral potassium chloride have been ordered and magnesium level is pending.  Creatinine has slightly increased to 1.61 from 1.41 at discharge, but significantly improved from her  last admission, which was 2.15. She does appear dehydrated as lips are cracked on exam.  No evidence of hypervolemia and BNP is 144.  Given concern for 2 falls over the last week and the patient's complaint of back pain today, imaging was ordered.  When x-ray came to take the patient for her x-rays and CT scans, the patient adamantly refused.  She then changed her mind, but as she was being transported, she again refused and stated "I want to until tomorrow to have X-rays."  Imaging was discussed for a second time with the patient, she again refused.  Consulted the family medicine residency service and spoke with Dr. Enid Derry who will accept the patient for admission.  GI has not been consulted at this time as patient's vital signs have normalized.  She has been satting at 98% on 2 L of oxygen without increased work of breathing.  Blood pressure has improved and she is not tachycardic or febrile.  Family medicine residency team will again consult with the patient about the importance of imaging given her recent falls and anticoagulation status.  Reevaluated the patient with family medicine residency team and patient is now agreeable to head CT, which is unremarkable.  She had a portable chest x-ray, which was unremarkable.  The patient appears reasonably stabilized for admission considering the current resources, flow, and capabilities available in the ED at this time, and I doubt any other [facility] requiring further screening and/or treatment in the ED prior to admission.   Final Clinical Impressions(s) / ED Diagnoses   Final diagnoses:  Shortness of breath  Gastrointestinal hemorrhage, unspecified gastrointestinal hemorrhage type    ED Discharge Orders    None       Joanne Gavel, PA-C 07/09/19 0503    Veryl Speak, MD 07/09/19 [id number]

## 2019-07-09 NOTE — Progress Notes (Signed)
PT Cancellation Note  Patient Details Name: Brianna Porter MRN: [id number] DOB: 1961/07/17   Cancelled Treatment:    Reason Eval/Treat Not Completed: Other (comment).  Pt declined PT in the ED and then again on the floor.  Will retry at another time.  No specific reason given but sounds anxious this afternoon.   Ramond Dial 07/09/2019, 5:42 PM   Mee Hives, PT MS Acute Rehab Dept. Number: [place] and [place]

## 2019-07-09 NOTE — ED Notes (Signed)
ED TO INPATIENT HANDOFF REPORT  ED Nurse Name and Phone #: Joellen Jersey [phone]  S Name/Age/Gender Brianna Porter 58 y.o. female Room/Bed: 026C/026C  Code Status   Code Status: Full Code  Home/SNF/Other Home Patient oriented to: self, place, time and situation Is this baseline? Yes   Triage Complete: Triage complete  Chief Complaint rectal bleeding   Triage Note THE PT ARRIVED BY [facility] FROM HOME  THE PT WAS HERE ON THE 9TH FOR  A RECATL BLEED  SHE SAW HER REGULAR DOCTOR YESTERDAY  AND DIAGNOSED A UTI  AND GAVE HER AN ANTIBIOTIC  SHE IS  COVERED MOST OF HER BODY WITH BRUISES AND ABRASIONS IN DIFFERENT  STAGES OF HEALING  ALERT BUT DROWSY   BLOOD IN STOOLS SINCE HER VISIT HERE THE DOCTOR YESTERDAY DID NOT ADDRESS IT.    CBG 138  THE PT LIVES WITH HER DAUGHTER  NEG COVID ON THE 9TH   Allergies Allergies  Allergen Reactions  . Ambien [Zolpidem Tartrate]     Pt and family state this med makes the patient sleepwalk.    Level of Care/Admitting Diagnosis ED Disposition    ED Disposition Condition [facility] Area: [place] [[id number]]  Level of Care: Telemetry Cardiac [103]  Covid Evaluation: Confirmed COVID Negative  Diagnosis: Anemia [id number]  Admitting Physician: Lyndee Hensen [id number]  Attending Physician: Leeanne Rio [phone]  Estimated length of stay: past midnight tomorrow  Certification:: I certify this patient will need inpatient services for at least 2 midnights  PT Class (Do Not Modify): Inpatient [101]  PT Acc Code (Do Not Modify): Private [1]       B Medical/Surgery History Past Medical History:  Diagnosis Date  . Anxiety and depression   . Atrial flutter ([place])   . Chronic systolic CHF (congestive heart failure) (HCC)    a. dx in setting of atrial fib/flutter - possibly tachy mediated. Coronary CTA with only mild CAD in 04/2019.  Marland Kitchen CKD (chronic kidney disease), stage II   . Confusion    a. persistent confusion  during 04/2019 admission of unclear cause. Home meds adjusted. CT/MRI brain nonacute.  . Diabetes ([place])   . Edema   . Elevated liver function tests   . Hypercholesteremia   . Hyperkalemia   . Hypertension   . Hypertensive heart and chronic kidney disease with systolic congestive heart failure ([place])   . Hyperthyroidism   . Hypokalemia   . Hypokalemia   . Hypomagnesemia   . Hyponatremia   . Iron deficiency anemia   . Mild CAD    a. Coronary CT 04/2019 - Minimal, Non-obstructive CAD.  Marland Kitchen NICM (nonischemic cardiomyopathy) ([place])   . Persistent atrial fibrillation ([place])   . Prolonged QT interval    Past Surgical History:  Procedure Laterality Date  . BIOPSY  06/29/2019   Procedure: BIOPSY;  Surgeon: Irene Shipper, MD;  Location: [facility];  Service: Endoscopy;;  . CARDIOVERSION N/A 04/29/2019   Procedure: CARDIOVERSION;  Surgeon: Sanda Klein, MD;  Location: [facility] ENDOSCOPY;  Service: Cardiovascular;  Laterality: N/A;  . CARDIOVERSION N/A 05/04/2019   Procedure: CARDIOVERSION;  Surgeon: Josue Hector, MD;  Location: [facility] ENDOSCOPY;  Service: Cardiovascular;  Laterality: N/A;  . ESOPHAGOGASTRODUODENOSCOPY  06/29/2019  . ESOPHAGOGASTRODUODENOSCOPY (EGD) WITH PROPOFOL N/A 06/29/2019   Procedure: ESOPHAGOGASTRODUODENOSCOPY (EGD) WITH PROPOFOL;  Surgeon: Irene Shipper, MD;  Location: [facility] ENDOSCOPY;  Service: Endoscopy;  Laterality: N/A;  . TEE WITHOUT CARDIOVERSION  04/29/2019  .  TEE WITHOUT CARDIOVERSION N/A 04/29/2019   Procedure: TRANSESOPHAGEAL ECHOCARDIOGRAM (TEE);  Surgeon: Sanda Klein, MD;  Location: [facility];  Service: Cardiovascular;  Laterality: N/A;  . TUBAL LIGATION       A IV Location/Drains/Wounds Patient Lines/Drains/Airways Status   Active Line/Drains/Airways    Name:   Placement date:   Placement time:   Site:   Days:   Peripheral IV 07/09/19 Left Forearm   07/09/19    0239    Forearm   less than 1   Peripheral IV 07/09/19 Right;Upper Arm   07/09/19    0240    Arm    less than 1          Intake/Output Last 24 hours  Intake/Output Summary (Last 24 hours) at 07/09/2019 1339 Last data filed at 07/09/2019 1134 Gross per 24 hour  Intake 2031.01 ml  Output -  Net 2031.01 ml    Labs/Imaging Results for orders placed or performed during the hospital encounter of 07/09/19 (from the past 48 hour(s))  Type and screen [place]     Status: None (Preliminary result)   Collection Time: 07/09/19  2:25 AM  Result Value Ref Range   ABO/RH(D) O POS    Antibody Screen NEG    Sample Expiration 07/12/2019,2359    Unit Number [id number]    Blood Component Type RBC, LR IRR    Unit division 00    Status of Unit ISSUED    Transfusion Status OK TO TRANSFUSE    Crossmatch Result      Compatible Performed at [facility], [address] [place], [place] [address]   Comprehensive metabolic panel     Status: Abnormal   Collection Time: 07/09/19  2:34 AM  Result Value Ref Range   Sodium 131 (L) 135 - 145 mmol/L   Potassium 2.6 (LL) 3.5 - 5.1 mmol/L    Comment: CRITICAL RESULT CALLED TO, READ BACK BY AND VERIFIED WITH: CHRISCO C,RN 07/09/19 0319 WAYK    Chloride 97 (L) 98 - 111 mmol/L   CO2 20 (L) 22 - 32 mmol/L   Glucose, Bld 115 (H) 70 - 99 mg/dL   BUN 24 (H) 6 - 20 mg/dL   Creatinine, Ser 1.61 (H) 0.44 - 1.00 mg/dL   Calcium 8.2 (L) 8.9 - 10.3 mg/dL   Total Protein 5.0 (L) 6.5 - 8.1 g/dL   Albumin 2.6 (L) 3.5 - 5.0 g/dL   AST 27 15 - 41 U/L   ALT 36 0 - 44 U/L   Alkaline Phosphatase 84 38 - 126 U/L   Total Bilirubin 0.9 0.3 - 1.2 mg/dL   GFR calc non Af Amer 35 (L) >60 mL/min   GFR calc Af Amer 40 (L) >60 mL/min   Anion gap 14 5 - 15    Comment: Performed at [facility], [place] [address] [place], [place] [address]  CBC WITH DIFFERENTIAL     Status: Abnormal   Collection Time: 07/09/19  2:34 AM  Result Value Ref Range   WBC 11.9 (H) 4.0 - 10.5 K/uL   RBC 1.99 (L) 3.87 - 5.11 MIL/uL   Hemoglobin 6.0 (LL) 12.0 -  15.0 g/dL    Comment: REPEATED TO VERIFY THIS CRITICAL RESULT HAS VERIFIED AND BEEN CALLED TO CAIN A RN BY SAINVIL SAINVILUS ON 11 19 2020 AT 0306, AND HAS BEEN READ BACK.     HCT 19.7 (L) 36.0 - 46.0 %   MCV 99.0 80.0 - 100.0  fL   MCH 30.2 26.0 - 34.0 pg   MCHC 30.5 30.0 - 36.0 g/dL   RDW 19.1 (H) 11.5 - 15.5 %   Platelets 535 (H) 150 - 400 K/uL   nRBC 0.3 (H) 0.0 - 0.2 %   Neutrophils Relative % 85 %   Neutro Abs 10.2 (H) 1.7 - 7.7 K/uL   Lymphocytes Relative 9 %   Lymphs Abs 1.1 0.7 - 4.0 K/uL   Monocytes Relative 3 %   Monocytes Absolute 0.3 0.1 - 1.0 K/uL   Eosinophils Relative 1 %   Eosinophils Absolute 0.1 0.0 - 0.5 K/uL   Basophils Relative 0 %   Basophils Absolute 0.0 0.0 - 0.1 K/uL   Immature Granulocytes 2 %   Abs Immature Granulocytes 0.18 (H) 0.00 - 0.07 K/uL    Comment: Performed at [facility] [address], [place], [place] [address]  Protime-INR     Status: None   Collection Time: 07/09/19  2:34 AM  Result Value Ref Range   Prothrombin Time 14.7 11.4 - 15.2 seconds   INR 1.2 0.8 - 1.2    Comment: (NOTE) INR goal varies based on device and disease states. Performed at [facility], [place] [address] [place], [place] [address]   Brain natriuretic peptide     Status: Abnormal   Collection Time: 07/09/19  2:41 AM  Result Value Ref Range   B Natriuretic Peptide 144.1 (H) 0.0 - 100.0 pg/mL    Comment: Performed at [facility] [address] [place], [place] [address]  Prepare RBC     Status: None   Collection Time: 07/09/19  3:19 AM  Result Value Ref Range   Order Confirmation      ORDER PROCESSED BY BLOOD BANK Performed at [facility], [place] [address] [place], [place] [address]   Magnesium     Status: None   Collection Time: 07/09/19  3:23 AM  Result Value Ref Range   Magnesium 1.9 1.7 - 2.4 mg/dL    Comment: Performed at [facility], [place] [address], [place], [place] [address]  SARS CORONAVIRUS 2 (TAT 6-24 HRS)  Nasopharyngeal Nasopharyngeal Swab     Status: None   Collection Time: 07/09/19  5:51 AM   Specimen: Nasopharyngeal Swab  Result Value Ref Range   SARS Coronavirus 2 NEGATIVE NEGATIVE    Comment: (NOTE) SARS-CoV-2 target nucleic acids are NOT DETECTED. The SARS-CoV-2 RNA is generally detectable in upper and lower respiratory specimens during the acute phase of infection. Negative results do not preclude SARS-CoV-2 infection, do not rule out co-infections with other pathogens, and should not be used as the sole basis for treatment or other patient management decisions. Negative results must be combined with clinical observations, patient history, and epidemiological information. The expected result is Negative. Fact Sheet for Patients: SugarRoll.be Fact Sheet for Healthcare Providers: https://www.woods-mathews.com/ This test is not yet approved or cleared by the [place] FDA and  has been authorized for detection and/or diagnosis of SARS-CoV-2 by FDA under an Emergency Use Authorization (EUA). This EUA will remain  in effect (meaning this test can be used) for the duration of the COVID-19 declaration under Section 56 4(b)(1) of the Act, 21 U.S.C. section 360bbb-3(b)(1), unless the authorization is terminated or revoked sooner. Performed at [facility], [place] [address], [place], [place] [id number]   Basic metabolic panel     Status: Abnormal   Collection Time: 07/09/19  7:59 AM  Result Value Ref Range   Sodium 136 135 - 145 mmol/L   Potassium 2.9 (L) 3.5 - 5.1 mmol/L   Chloride 106 98 - 111 mmol/L   CO2 21 (L) 22 - 32 mmol/L   Glucose, Bld 87 70 - 99 mg/dL   BUN 19 6 - 20 mg/dL   Creatinine, Ser 1.34 (H) 0.44 - 1.00 mg/dL   Calcium 7.8 (L) 8.9 - 10.3 mg/dL   GFR calc non Af Amer 44 (L) >60 mL/min   GFR calc Af Amer 50 (L) >60 mL/min   Anion gap 9 5 - 15    Comment: Performed at [facility] [address],  [place], [place] [address]  CBC     Status: Abnormal   Collection Time: 07/09/19  7:59 AM  Result Value Ref Range   WBC 11.1 (H) 4.0 - 10.5 K/uL   RBC 1.97 (L) 3.87 - 5.11 MIL/uL   Hemoglobin 5.9 (LL) 12.0 - 15.0 g/dL    Comment: REPEATED TO VERIFY CRITICAL VALUE NOTED.  VALUE IS CONSISTENT WITH PREVIOUSLY REPORTED AND CALLED VALUE.    HCT 19.4 (L) 36.0 - 46.0 %   MCV 98.5 80.0 - 100.0 fL   MCH 29.9 26.0 - 34.0 pg   MCHC 30.4 30.0 - 36.0 g/dL   RDW 19.1 (H) 11.5 - 15.5 %   Platelets 487 (H) 150 - 400 K/uL   nRBC 0.0 0.0 - 0.2 %    Comment: Performed at [facility] [address], [place], [place] [address]  Urinalysis, Routine w reflex microscopic     Status: Abnormal   Collection Time: 07/09/19  8:26 AM  Result Value Ref Range   Color, Urine STRAW (A) YELLOW   APPearance CLEAR CLEAR   Specific Gravity, Urine 1.006 1.005 - 1.030   pH 6.0 5.0 - 8.0   Glucose, UA NEGATIVE NEGATIVE mg/dL   Hgb urine dipstick NEGATIVE NEGATIVE   Bilirubin Urine NEGATIVE NEGATIVE   Ketones, ur NEGATIVE NEGATIVE mg/dL   Protein, ur NEGATIVE NEGATIVE mg/dL   Nitrite NEGATIVE NEGATIVE   Leukocytes,Ua NEGATIVE NEGATIVE    Comment: Performed at  [address], [place], [place] [address]   Ct Head Wo Contrast  Result Date: 07/09/2019 CLINICAL DATA:  Altered level of consciousness, unexplained EXAM: CT HEAD WITHOUT CONTRAST TECHNIQUE: Contiguous axial images were obtained from the base of the skull through the vertex without intravenous contrast. COMPARISON:  05/01/2019 FINDINGS: Brain: No evidence of acute infarction, hemorrhage, hydrocephalus, extra-axial collection or mass lesion/mass effect. Mild white matter disease better seen by interval brain MRI. Normal brain volume. Vascular: No hyperdense vessel or unexpected calcification. Skull: Normal. Negative for fracture or focal lesion. Sinuses/Orbits: No acute finding. Other: Artifact from temperature probe over the forehead.  IMPRESSION: No acute or interval finding. Electronically Signed   By: Monte Fantasia M.D.   On: 07/09/2019 04:42   Dg Chest Port 1 View  Result Date: 07/09/2019 CLINICAL DATA:  Shortness of breath EXAM: PORTABLE CHEST 1 VIEW COMPARISON:  Radiograph 06/28/2019 FINDINGS: The cardiomediastinal contours are unchanged with borderline cardiomegaly. The lungs are clear. Pulmonary vasculature is normal. No consolidation, pleural effusion, or pneumothorax. No acute osseous abnormalities are seen. IMPRESSION: No acute findings. Electronically Signed   By: Keith Rake M.D.   On: 07/09/2019 04:21    Pending Labs Unresulted Labs (From admission, onward)    Start     Ordered   07/10/19 0500  CBC  once in am  Tomorrow morning,   R     07/09/19 1119   07/10/19 [id number]  Basic metabolic panel once in am  Tomorrow morning,   R     07/09/19 1122   07/09/19 1400  Hemoglobin and hematocrit, blood  Once-Timed,   STAT     07/09/19 1119   07/09/19 [id number]  Basic metabolic panel Once  Once-Timed,   STAT     07/09/19 1122          Vitals/Pain Today's Vitals   07/09/19 1100 07/09/19 1130 07/09/19 1135 07/09/19 1300  BP: (!) 98/51 120/71 120/71 (!) 148/63  Pulse: 72 71 72 74  Resp: 16 13 16  (!) 21  Temp:   98.2 F (36.8 C)   TempSrc:   Oral   SpO2: 100% 100% 100% 100%  Weight:      PainSc:        Isolation Precautions No active isolations  Medications Medications  sodium chloride 0.9 % bolus 1,000 mL (0 mLs Intravenous Stopped 07/09/19 0743)    And  0.9 %  sodium chloride infusion ( Intravenous New Bag/Given 07/09/19 0814)  0.9 %  sodium chloride infusion (Manually program via Guardrails IV Fluids) ( Intravenous Not Given 07/09/19 0814)  Muscle Rub CREA (has no administration in time range)  acetaminophen (TYLENOL) tablet 650 mg (has no administration in time range)    Or  acetaminophen (TYLENOL) suppository 650 mg (has no administration in time range)  pantoprazole (PROTONIX) injection 40 mg  (40 mg Intravenous Given 07/09/19 1141)  methimazole (TAPAZOLE) tablet 5 mg (has no administration in time range)  amiodarone (PACERONE) tablet 200 mg (200 mg Oral Given 07/09/19 1022)  pantoprazole (PROTONIX) 80 mg in sodium chloride 0.9 % 100 mL IVPB (0 mg Intravenous Stopped 07/09/19 0318)  potassium chloride 10 mEq in 100 mL IVPB (0 mEq Intravenous Stopped 07/09/19 0647)  potassium chloride SA (KLOR-CON) CR tablet 40 mEq (40 mEq Oral Given 07/09/19 1141)    Mobility walks with person assist High fall risk   Focused Assessments  Cardiac Rhythm: Normal sinus rhythm     R Recommendations: See Admitting Provider Note  Report given to:

## 2019-07-09 NOTE — Progress Notes (Signed)
Patient in her room crying and very anxious that she wants her tramadol for pain and her xanax for anxiety, MD made aware, ordered PRN tramadol and schedule Xanax, RN gave tramadol but patient is insisting she also wants her xanax to help her calm down, RN paged MD awaiting order.

## 2019-07-09 NOTE — ED Triage Notes (Signed)
THE PT ARRIVED BY [facility] FROM HOME  THE PT WAS HERE ON THE 9TH FOR  A RECATL BLEED  SHE SAW HER REGULAR DOCTOR YESTERDAY  AND DIAGNOSED A UTI  AND GAVE HER AN ANTIBIOTIC  SHE IS  COVERED MOST OF HER BODY WITH BRUISES AND ABRASIONS IN DIFFERENT  STAGES OF HEALING  ALERT BUT DROWSY   BLOOD IN STOOLS SINCE HER VISIT HERE THE DOCTOR YESTERDAY DID NOT ADDRESS IT.    CBG 138  THE PT LIVES WITH HER DAUGHTER  NEG COVID ON THE 9TH

## 2019-07-09 NOTE — H&P (View-Only) (Signed)
[facility] Consult: 9:46 AM 07/09/2019  LOS: 0 days    Referring Provider: Dr Ardelia Mems  Primary Care Physician:  Cher Nakai, MD in [place]. Primary Gastroenterologist: In [place].  Dr. Nehemiah Settle   Reason for Consultation:  GI bleed.  Anemia.    HPI: Brianna Porter is a 58 y.o. female.  Paroxysmal atrial fibrillation.  Chronic Eliquis.  Hypothyroidism.  Anxiety.  CHF, EF 20 to 25%.  Stage 2 CKD.  Previous colonoscopies, at the first colonoscopy she had polyps.  The second colonoscopy was in ~2017 and there were no recurrent polyps.   During admission in 11/8 - 06/30/2019 Dr Henrene Pastor evaluated melena, anemia, burning epigastric pain in setting of 400 to 600 mg ibuprofen/day.   06/29/2019 EGD: Nonbleeding gastric ulcers, no stigmata of bleeding, otherwise normal study.  Pathology showed reactive gastropathy.  No H. pylori, no intestinal metaplasia Other issues were failure.  Apixaban started prior to discharge, rplacing Xarelto which may have caused lower extremity pruritus. Hgb 6.5 >> 1 PRBC >> 7.6 at dc.  RXd Protonix 40 mg bid.  Contrary to verbal report, she has been compliant with the Protonix and has not used any ibuprofen.  Did well at discharge.  Says she was eating fine without any problems, stools were brown.  However now day 3 of dark stools with burgundy coloration, not bright red blood, burning epigastric pain, weakness.  1 episode coffee-like emesis.  She took 1 Alka-Seltzer on Tuesday to try to deal with the burning in her epigastrium has had several falls in the last week and is complaining of new, severe low back pain.   Seen at PMD yest and RXd with z pack for URI, stool cards dispensed for FOBT test.  Because of the ongoing stools, weakness and taking another fall last night after taking 2 Xanax  to try to help her sleep, her daughter brought her to the [place] ED last night and she was subsequently transferred to [facility] ED due to lack of GI coverage in [place].  Initial BP 88/49 now in the 140s over 50s.  Pulses in the 60s to 35s.  O2 sats as low as 86% but now  90s to 100% Hgb 5.9.  BUN 24 (40 max last admit).  Potassium low at 2.9. CT of the head is negative.  CXR unremarkable. 1 PRBC ordered and currently transfusing. Received 80 mg bolus of Protonix and now on 40 mg IV bid.   Stool is dark and FOBT positive. Her biggest complaint is her low back pain.  Family history of malignant colon polyp in her mother who died with pancreatic cancer at age 6 in fall 2019. Pt does not drink ETOH.   Never drank.        Past Medical History:  Diagnosis Date   Anxiety and depression    Atrial flutter (HCC)    Chronic systolic CHF (congestive heart failure) ([facility])    a. dx in setting of atrial fib/flutter - possibly tachy mediated. Coronary CTA with only mild CAD in 04/2019.   CKD (chronic kidney  disease), stage II    Confusion    a. persistent confusion during 04/2019 admission of unclear cause. Home meds adjusted. CT/MRI brain nonacute.   Diabetes ([place])    Edema    Elevated liver function tests    Hypercholesteremia    Hyperkalemia    Hypertension    Hypertensive heart and chronic kidney disease with systolic congestive heart failure (HCC)    Hyperthyroidism    Hypokalemia    Hypokalemia    Hypomagnesemia    Hyponatremia    Iron deficiency anemia    Mild CAD    a. Coronary CT 04/2019 - Minimal, Non-obstructive CAD.   NICM (nonischemic cardiomyopathy) ([place])    Persistent atrial fibrillation (HCC)    Prolonged QT interval     Past Surgical History:  Procedure Laterality Date   BIOPSY  06/29/2019   Procedure: BIOPSY;  Surgeon: Irene Shipper, MD;  Location: [facility];  Service: Endoscopy;;   CARDIOVERSION N/A 04/29/2019   Procedure: CARDIOVERSION;   Surgeon: Sanda Klein, MD;  Location: [facility] ENDOSCOPY;  Service: Cardiovascular;  Laterality: N/A;   CARDIOVERSION N/A 05/04/2019   Procedure: CARDIOVERSION;  Surgeon: Josue Hector, MD;  Location: [facility] ENDOSCOPY;  Service: Cardiovascular;  Laterality: N/A;   ESOPHAGOGASTRODUODENOSCOPY  06/29/2019   ESOPHAGOGASTRODUODENOSCOPY (EGD) WITH PROPOFOL N/A 06/29/2019   Procedure: ESOPHAGOGASTRODUODENOSCOPY (EGD) WITH PROPOFOL;  Surgeon: Irene Shipper, MD;  Location: [facility];  Service: Endoscopy;  Laterality: N/A;   TEE WITHOUT CARDIOVERSION  04/29/2019   TEE WITHOUT CARDIOVERSION N/A 04/29/2019   Procedure: TRANSESOPHAGEAL ECHOCARDIOGRAM (TEE);  Surgeon: Sanda Klein, MD;  Location: [facility] ENDOSCOPY;  Service: Cardiovascular;  Laterality: N/A;   TUBAL LIGATION      Prior to Admission medications   Medication Sig Start Date End Date Taking? Authorizing Provider  acetaminophen (TYLENOL) 325 MG tablet Take 2 tablets (650 mg total) by mouth every 6 (six) hours as needed for mild pain (or Fever >/= 101). 06/30/19   Guadalupe Dawn, MD  albuterol (VENTOLIN HFA) 108 (90 Base) MCG/ACT inhaler Inhale 2 puffs into the lungs every 4 (four) hours as needed for wheezing or shortness of breath.  06/06/19   [provider]  ALPRAZolam Duanne Moron) 0.5 MG tablet Take 0.5-1 mg by mouth See admin instructions. Take 0.5-1 mg by mouth two times a day (midday and bedtime) 02/14/19   [provider]  amiodarone (PACERONE) 200 MG tablet Take 200 mg by mouth 2 (two) times daily.    [provider]  apixaban (ELIQUIS) 5 MG TABS tablet Take 1 tablet (5 mg total) by mouth 2 (two) times daily. 06/30/19   Guadalupe Dawn, MD  Ascorbic Acid (VITAMIN C) 1000 MG tablet Take 1,000 mg by mouth daily.    [provider]  cetirizine (ZYRTEC) 10 MG tablet Take 10 mg by mouth daily as needed for allergies.     [provider]  cholecalciferol (VITAMIN D3) 25 MCG (1000 UT) tablet Take 1,000 Units  by mouth daily.    [provider]  DULoxetine (CYMBALTA) 60 MG capsule Take 60 mg by mouth 2 (two) times daily. 02/26/19   [provider]  fenofibrate (TRICOR) 48 MG tablet Take 48 mg by mouth at bedtime. 02/26/19   [provider]  furosemide (LASIX) 40 MG tablet Take 1 tablet (40 mg total) by mouth daily. 06/30/19   Guadalupe Dawn, MD  Menthol, Topical Analgesic, (ICY HOT ADVANCED RELIEF EX) Apply 1 application topically 3 (three) times daily as needed (pain).  [provider]  methimazole (TAPAZOLE) 5 MG tablet Take 5 mg by mouth at bedtime.  02/21/19   [provider]  metoprolol succinate (TOPROL-XL) 100 MG 24 hr tablet Take 50 mg by mouth 2 (two) times daily.  06/09/19   [provider]  pantoprazole (PROTONIX) 40 MG tablet Take 1 tablet (40 mg total) by mouth 2 (two) times daily. 06/30/19 06/29/20  Guadalupe Dawn, MD  traMADol (ULTRAM) 50 MG tablet Take 50 mg by mouth 4 (four) times daily as needed (pain).  06/09/19   [provider]    Scheduled Meds:  sodium chloride   Intravenous Once   amiodarone  200 mg Oral BID   methimazole  5 mg Oral QHS   pantoprazole (PROTONIX) IV  40 mg Intravenous Q12H   potassium chloride  20 mEq Oral Once   Infusions:  sodium chloride 10 mL/hr at 07/09/19 0814   potassium chloride 10 mEq (07/09/19 0806)   PRN Meds: acetaminophen **OR** acetaminophen, Muscle Rub   Allergies as of 07/09/2019 - Review Complete 07/09/2019  Allergen Reaction Noted   Ambien [zolpidem tartrate]  04/30/2019    Family History  Problem Relation Age of Onset   Cancer Mother    Heart disease Mother    Hypercholesterolemia Mother    Osteoarthritis Mother    Stroke Mother    Heart disease Father    Hypercholesterolemia Brother     Social History   Socioeconomic History   Marital status: Divorced    Spouse name: Not on file   Number of children: Not on file   Years of education: Not  on file   Highest education level: Not on file  Occupational History   Not on file  Social Needs   Financial resource strain: Not on file   Food insecurity    Worry: Not on file    Inability: Not on file   Transportation needs    Medical: Not on file    Non-medical: Not on file  Tobacco Use   Smoking status: Former Smoker    Packs/day: 2.00    Years: 30.00    Pack years: 60.00    Types: Cigarettes    Quit date: 2001    Years since quitting: 19.8   Smokeless tobacco: Never Used  Substance and Sexual Activity   Alcohol use: Never    Frequency: Never   Drug use: Never   Sexual activity: Not on file  Lifestyle   Physical activity    Days per week: Not on file    Minutes per session: Not on file   Stress: Not on file  Relationships   Social connections    Talks on phone: Not on file    Gets together: Not on file    Attends religious service: Not on file    Active member of club or organization: Not on file    Attends meetings of clubs or organizations: Not on file    Relationship status: Not on file   Intimate partner violence    Fear of current or ex partner: Not on file    Emotionally abused: Not on file    Physically abused: Not on file    Forced sexual activity: Not on file  Other Topics Concern   Not on file  Social History Narrative   Not on file    REVIEW OF SYSTEMS: Constitutional: Weakness.  Falls at home as per HPI. ENT:  No nose bleeds Pulm: No new shortness of breath.  No cough. CV:  No palpitations, no LE edema.  No angina. GU:  No hematuria, no frequency GI: See HPI. Heme: Bruises easily and has several bruises on her arms and legs. Transfusions: See HPI. Neuro:  No headaches, no peripheral tingling or numbness.  Dizziness and some presyncope but no syncope.  No peripheral numbness or tingling.  No loss of sensation.  No blurry vision. Derm: Sores from trauma on her lower legs take months to heal.  She has several sores on her lower  legs. Endocrine:  No sweats or chills.  No polyuria or dysuria Immunization: There are no records of any vaccinations in epic. Travel:  None beyond local counties in last few months.    PHYSICAL EXAM: Vital signs in last 24 hours: Vitals:   07/09/19 0813 07/09/19 0849  BP: (!) 137/46 (!) 140/44  Pulse: 73 70  Resp: 15 15  Temp: 98.1 F (36.7 C) 98.1 F (36.7 C)  SpO2: 100% 100%   Wt Readings from Last 3 Encounters:  07/09/19 62 kg  06/30/19 62 kg  06/12/19 59 kg    General: Chronically ill, pale, doughy appearing WF who is uncomfortable Head: No facial asymmetry.  Bruising on the right upper eyelid. Eyes: No scleral icterus.  Conjunctiva pale.  EOMI. Ears: Not hard of hearing Nose: No congestion, no discharge Mouth: Moist, pink, clear oropharynx.  Many teeth missing.  Tongue midline. Neck: No JVD, no masses, no thyromegaly. Lungs: No labored breathing.  Expiratory wheezes bilaterally.  No cough. Heart: RRR Abdomen: Soft.  Minimal tenderness in the epigastrium.  No guarding or rebound.  No HSM, masses, bruits, hernias..   Rectal: Deferred. Musc/Skeltl: No joint swelling or gross deformity. Extremities: Multiple abrasions, small trauma related lesions and purpura on her arms and legs.  No large hematomas/bruises on her back or limbs. Neurologic: Oriented x3.  Moves all 4 limbs, strength not tested.  No tremor, no asterixis. Skin: Multiple bruises and lesions as described above. Tattoos: None observed. Nodes: No cervical adenopathy. Psych: Mildly anxious but pleasant, mostly calm, cooperative.  Fluid speech.  Good historian.  Intake/Output from previous day: 11/18 0701 - 11/19 0700 In: 1183.3 [I.V.:1000; IV Piggyback:183.3] Out: -  Intake/Output this shift: Total I/O In: 315 [Blood:315] Out: -   LAB RESULTS: Recent Labs    07/09/19 0234 07/09/19 0759  WBC 11.9* 11.1*  HGB 6.0* 5.9*  HCT 19.7* 19.4*  PLT 535* 487*   BMET Lab Results  Component Value Date    NA 136 07/09/2019   NA 131 (L) 07/09/2019   NA 132 (L) 06/30/2019   K 2.9 (L) 07/09/2019   K 2.6 (LL) 07/09/2019   K 5.1 06/30/2019   CL 106 07/09/2019   CL 97 (L) 07/09/2019   CL 102 06/30/2019   CO2 21 (L) 07/09/2019   CO2 20 (L) 07/09/2019   CO2 22 06/30/2019   GLUCOSE 87 07/09/2019   GLUCOSE 115 (H) 07/09/2019   GLUCOSE 117 (H) 06/30/2019   BUN 19 07/09/2019   BUN 24 (H) 07/09/2019   BUN 25 (H) 06/30/2019   CREATININE 1.34 (H) 07/09/2019   CREATININE 1.61 (H) 07/09/2019   CREATININE 1.41 (H) 06/30/2019   CALCIUM 7.8 (L) 07/09/2019   CALCIUM 8.2 (L) 07/09/2019   CALCIUM 8.5 (L) 06/30/2019   LFT Recent Labs    07/09/19 0234  PROT 5.0*  ALBUMIN 2.6*  AST 27  ALT 36  ALKPHOS 84  BILITOT 0.9   PT/INR Lab Results  Component Value  Date   INR 1.2 07/09/2019   Hepatitis Panel No results for input(s): HEPBSAG, HCVAB, HEPAIGM, HEPBIGM in the last 72 hours. C-Diff No components found for: CDIFF Lipase     Component Value Date/Time   LIPASE 23 06/28/2019 1830    Drugs of Abuse  No results found for: LABOPIA, COCAINSCRNUR, LABBENZ, AMPHETMU, THCU, LABBARB   RADIOLOGY STUDIES: Ct Head Wo Contrast  Result Date: 07/09/2019 CLINICAL DATA:  Altered level of consciousness, unexplained EXAM: CT HEAD WITHOUT CONTRAST TECHNIQUE: Contiguous axial images were obtained from the base of the skull through the vertex without intravenous contrast. COMPARISON:  05/01/2019 FINDINGS: Brain: No evidence of acute infarction, hemorrhage, hydrocephalus, extra-axial collection or mass lesion/mass effect. Mild white matter disease better seen by interval brain MRI. Normal brain volume. Vascular: No hyperdense vessel or unexpected calcification. Skull: Normal. Negative for fracture or focal lesion. Sinuses/Orbits: No acute finding. Other: Artifact from temperature probe over the forehead. IMPRESSION: No acute or interval finding. Electronically Signed   By: Monte Fantasia M.D.   On: 07/09/2019  04:42   Dg Chest Port 1 View  Result Date: 07/09/2019 CLINICAL DATA:  Shortness of breath EXAM: PORTABLE CHEST 1 VIEW COMPARISON:  Radiograph 06/28/2019 FINDINGS: The cardiomediastinal contours are unchanged with borderline cardiomegaly. The lungs are clear. Pulmonary vasculature is normal. No consolidation, pleural effusion, or pneumothorax. No acute osseous abnormalities are seen. IMPRESSION: No acute findings. Electronically Signed   By: Keith Rake M.D.   On: 07/09/2019 04:21      IMPRESSION:   *   GI bleed.  Suspect the gastric ulcers as a source.  *   Blood loss anemia.   Recurrent.  Receiving 1 of 1 PRBCs now.  *   Weakness and multiple falls.  Head CT negative for trauma C/O no back pain.  Has not had any radiologic imaging of her low back. Uses Xanax regularly at nighttime for sleep aid.  *    Paroxysmal atrial fibrillation.  Chronic anticoagulation.  Switched from Xarelto to Eliquis 10 days ago.  Last dose of Eliquis was 11/18 in the morning.  *    Hypokalemia.  IV runs potassium ordered.  *   Covid 19 negative  PLAN:     *    EGD set up for 845 tomorrow morning.  This will allow time for any additional transfusions, washout of Eliquis.  *   H&H at 1 PM today.  CBC in the morning.   Azucena Freed  07/09/2019, 9:46 AM Phone [phone]  I have reviewed the entire case in detail with the above APP and discussed the plan in detail.  Therefore, I agree with the diagnoses recorded above. In addition,  I have personally interviewed and examined the patient and have personally reviewed any abdominal/pelvic CT scan images. I also reviewed Dr. Blanch Media recent EGD report.  My additional thoughts are as follows:  This seems to be small bowel bleeding, it is not clear if the ulcers found on recent EGD were the culprit given their size and clean base, especially now in light of recurrent bleeding.  Perhaps they were the source and bleeding recurred because of her oral  anticoagulation.  Nevertheless, I wonder about a more distal small bowel source.  She reports having had a colonoscopy with Dr. Melina Copa in [place] within the last 1 to 2 years.  When I first heard about her, we were planning an upper endoscopy for tomorrow.  I now would like to do a small  bowel enteroscopy instead.  I discussed that with her and just explained that it is the same procedure only with a longer scope going further into the small bowel.  Her nurse was present for the entire encounter and she was agreeable after discussion of procedure and risks.  The benefits and risks of the planned procedure were described in detail with the patient or (when appropriate) their health care proxy.  Risks were outlined as including, but not limited to, bleeding, infection, perforation, adverse medication reaction leading to cardiac or pulmonary decompensation, pancreatitis (if ERCP).  The limitation of incomplete mucosal visualization was also discussed.  No guarantees or warranties were given.  Patient at increased risk for cardiopulmonary complications of procedure due to medical comorbidities.     [name] Office:[phone]

## 2019-07-09 NOTE — ED Notes (Signed)
No answer when calling the number listed for the daughter  Message left to call us

## 2019-07-10 ENCOUNTER — Inpatient Hospital Stay (HOSPITAL_COMMUNITY): Payer: Medicare HMO | Admitting: Certified Registered"

## 2019-07-10 ENCOUNTER — Encounter (HOSPITAL_COMMUNITY)
Admission: EM | Disposition: A | Discharge status: Home Health | Payer: Self-pay | Source: Home / Self Care | Attending: Family Medicine

## 2019-07-10 ENCOUNTER — Inpatient Hospital Stay (HOSPITAL_COMMUNITY): Payer: Medicare HMO

## 2019-07-10 ENCOUNTER — Encounter (HOSPITAL_COMMUNITY): Payer: Self-pay | Admitting: *Deleted

## 2019-07-10 ENCOUNTER — Other Ambulatory Visit: Payer: Self-pay

## 2019-07-10 DIAGNOSIS — K254 Chronic or unspecified gastric ulcer with hemorrhage: Principal | ICD-10-CM

## 2019-07-10 DIAGNOSIS — K922 Gastrointestinal hemorrhage, unspecified: Secondary | ICD-10-CM

## 2019-07-10 HISTORY — PX: ENTEROSCOPY: SHX5533

## 2019-07-10 HISTORY — PX: SUBMUCOSAL TATTOO INJECTION: SHX6856

## 2019-07-10 HISTORY — PX: SMALL BOWEL ENTEROSCOPY: SHX2415

## 2019-07-10 LAB — CBC
HCT: 25.2 % — ABNORMAL LOW (ref 36.0–46.0)
Hemoglobin: 8.3 g/dL — ABNORMAL LOW (ref 12.0–15.0)
MCH: 29.5 pg (ref 26.0–34.0)
MCHC: 32.9 g/dL (ref 30.0–36.0)
MCV: 89.7 fL (ref 80.0–100.0)
Platelets: 571 10*3/uL — ABNORMAL HIGH (ref 150–400)
RBC: 2.81 MIL/uL — ABNORMAL LOW (ref 3.87–5.11)
RDW: 22.8 % — ABNORMAL HIGH (ref 11.5–15.5)
WBC: 8.2 10*3/uL (ref 4.0–10.5)
nRBC: 0 % (ref 0.0–0.2)

## 2019-07-10 LAB — BPAM RBC
Blood Product Expiration Date: 202011292359
ISSUE DATE / TIME: 202011190829
Unit Type and Rh: 5100

## 2019-07-10 LAB — BASIC METABOLIC PANEL
Anion gap: 12 (ref 5–15)
BUN: 13 mg/dL (ref 6–20)
CO2: 22 mmol/L (ref 22–32)
Calcium: 8.8 mg/dL — ABNORMAL LOW (ref 8.9–10.3)
Chloride: 105 mmol/L (ref 98–111)
Creatinine, Ser: 1.18 mg/dL — ABNORMAL HIGH (ref 0.44–1.00)
GFR calc Af Amer: 59 mL/min — ABNORMAL LOW (ref >60)
GFR calc non Af Amer: 51 mL/min — ABNORMAL LOW (ref >60)
Glucose, Bld: 95 mg/dL (ref 70–99)
Potassium: 3.3 mmol/L — ABNORMAL LOW (ref 3.5–5.1)
Sodium: 139 mmol/L (ref 135–145)

## 2019-07-10 LAB — TYPE AND SCREEN
ABO/RH(D): O POS
Antibody Screen: NEGATIVE
Unit division: 0

## 2019-07-10 IMAGING — CR DG RIBS 2V*R*
3 series · 3 of 3 positions shown · non-contrast
Comparison: Chest x-ray [DATE]

CLINICAL DATA: Fell.  Right lower rib pain.

EXAM:
RIGHT RIBS - 2 VIEW

[rib ap (1 of 2)]
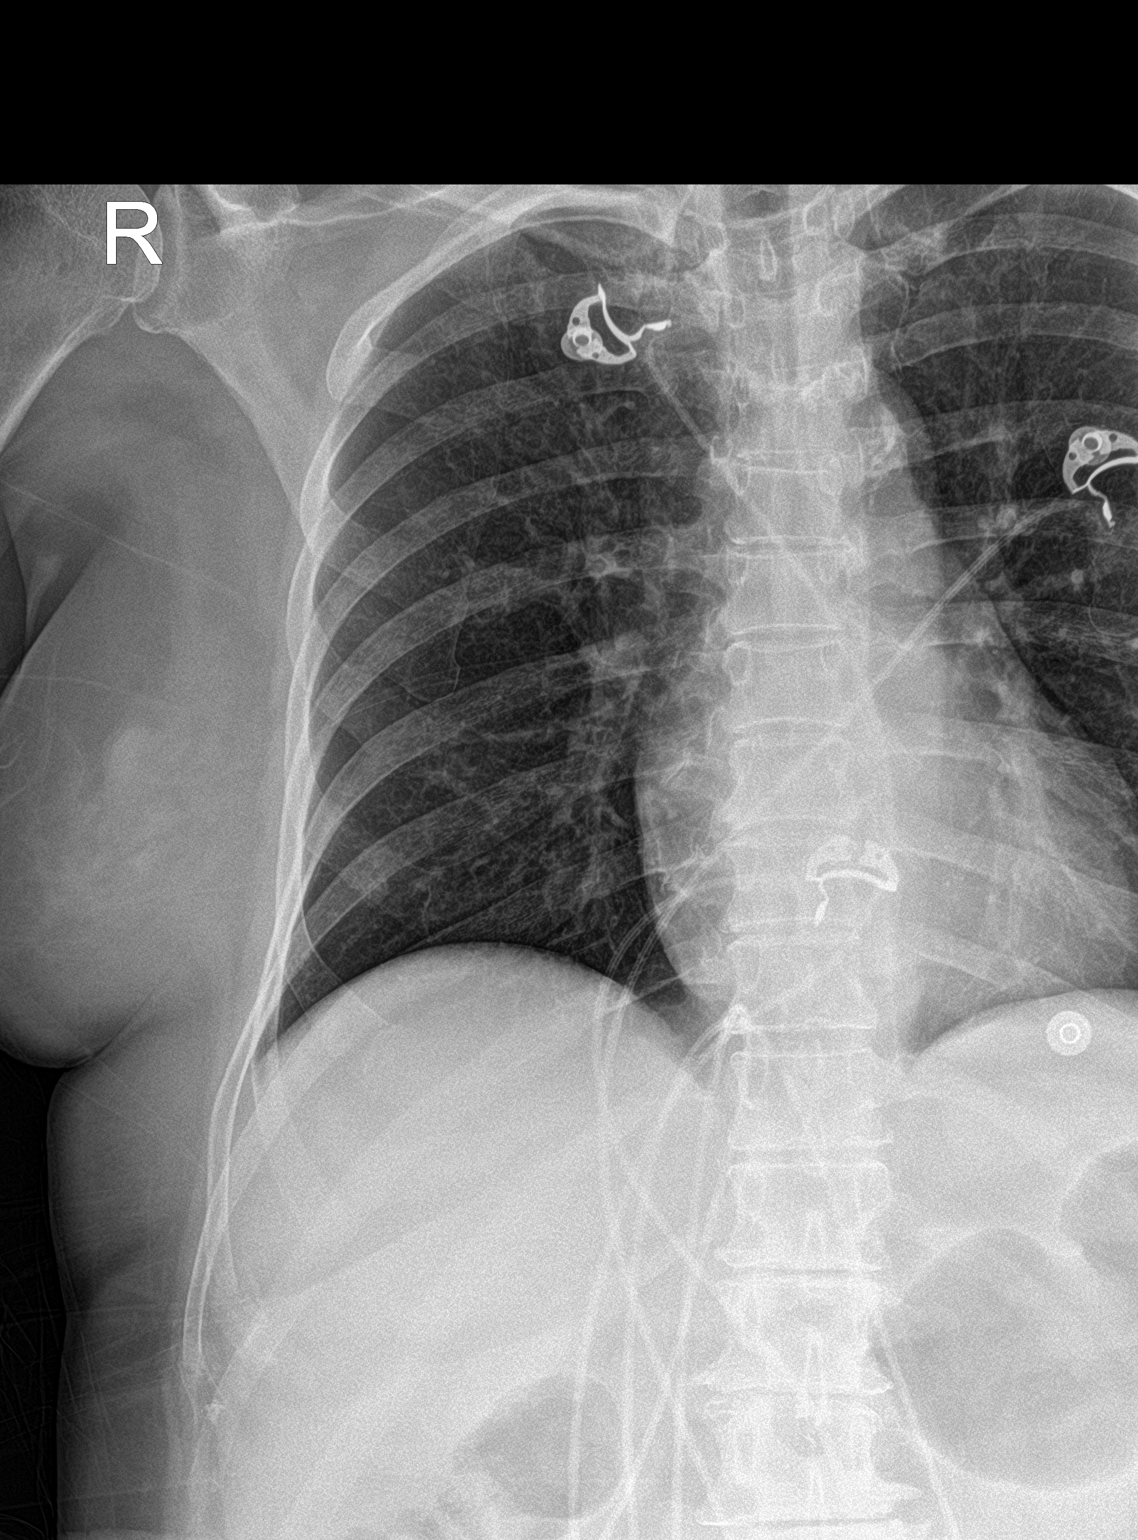

[rib ap obl]
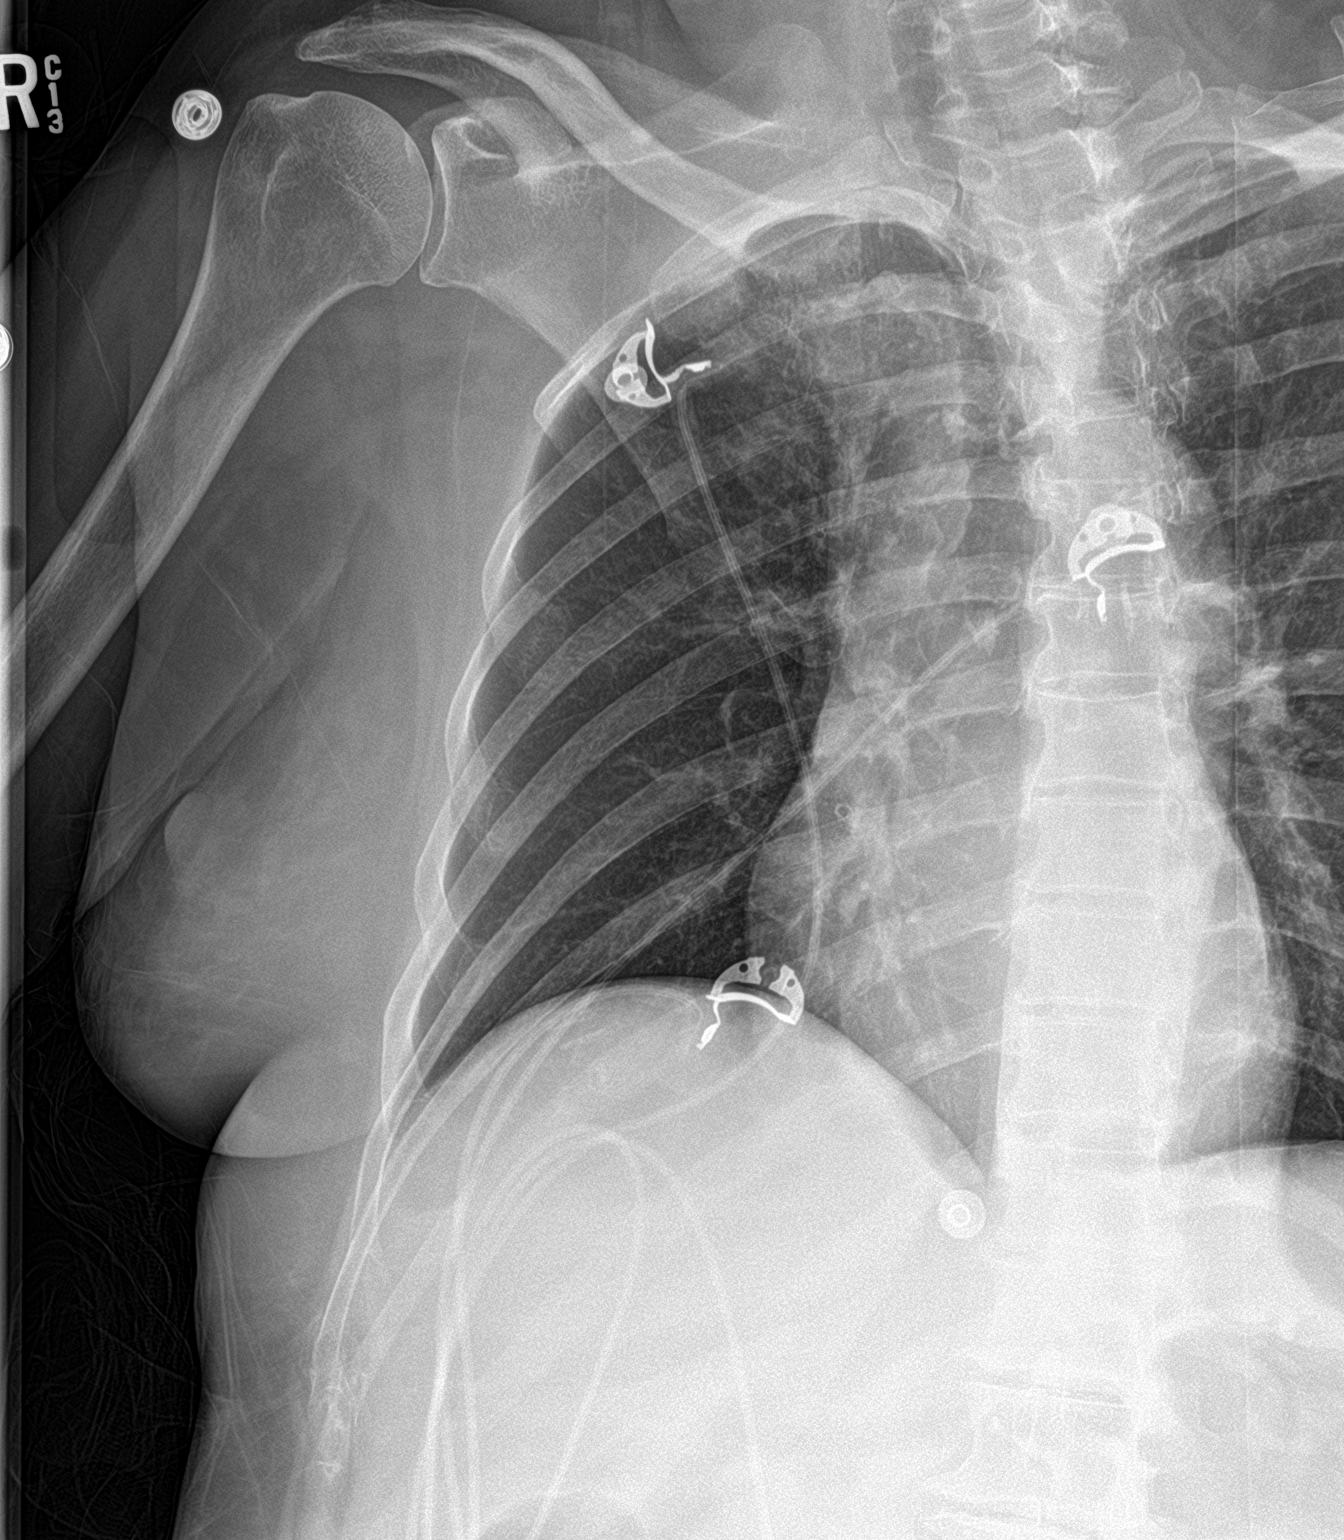

[rib ap (2 of 2)]
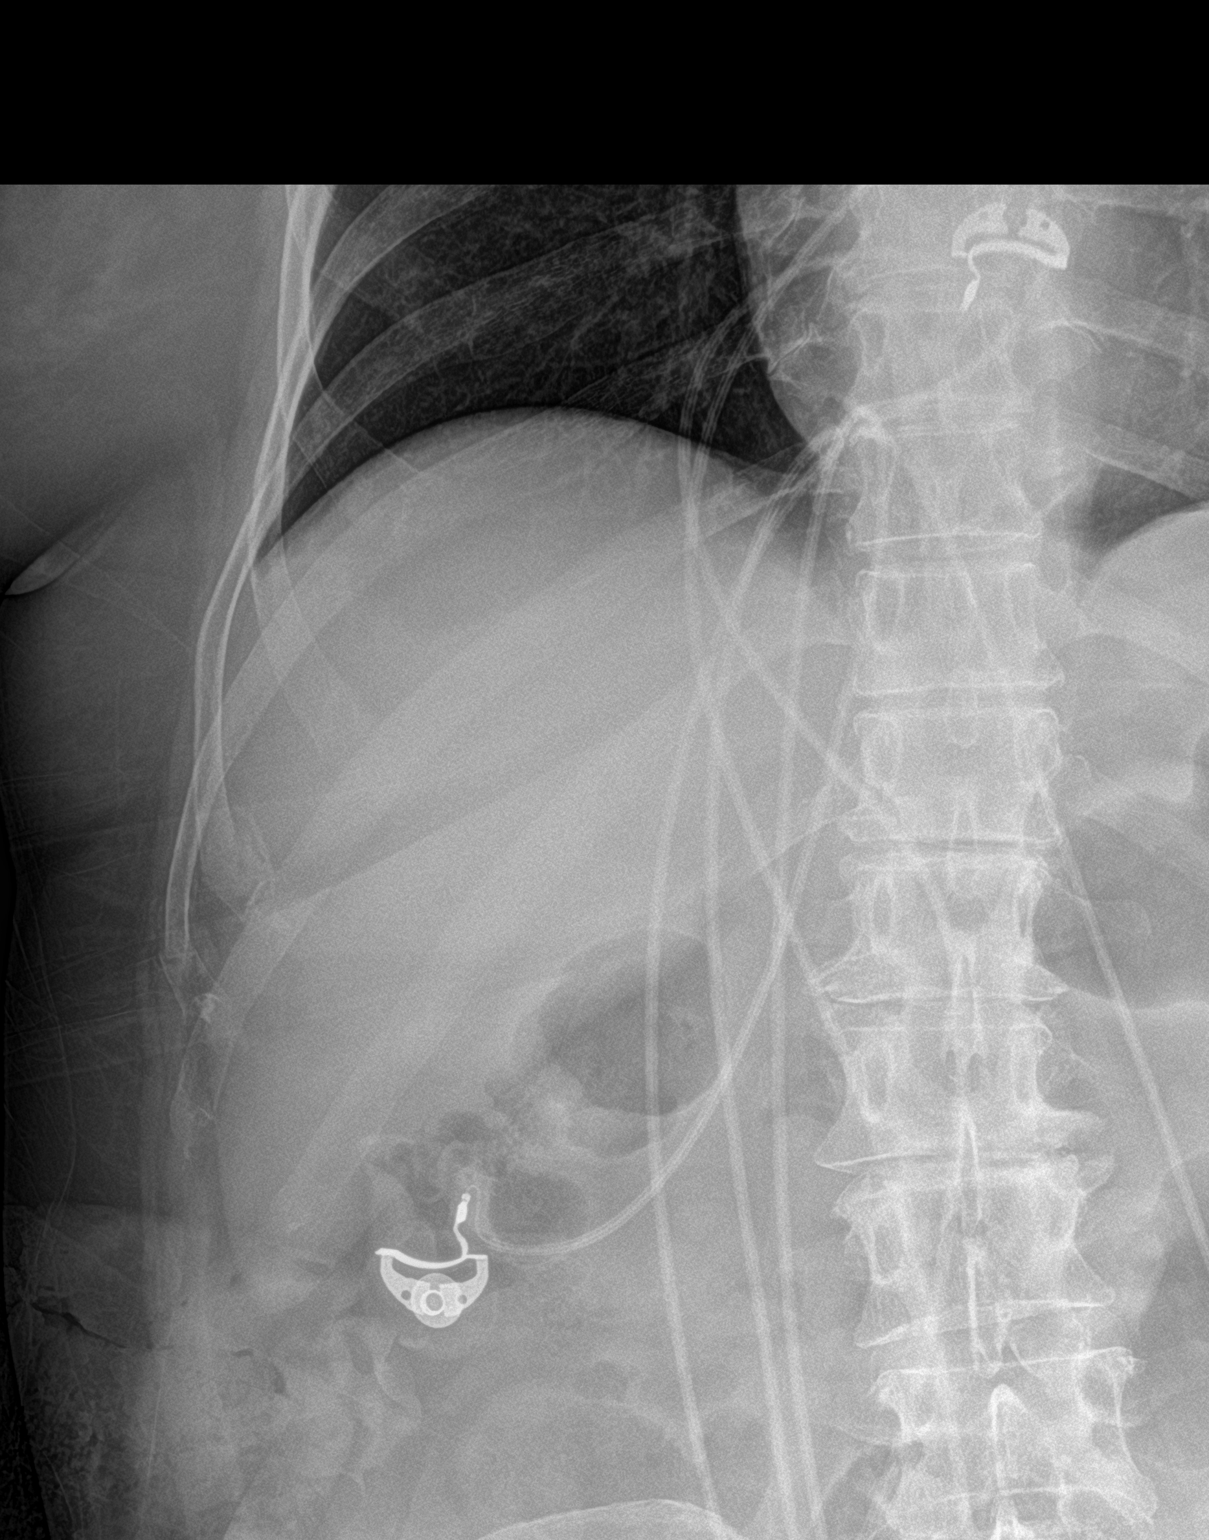

[3 of 3 positions shown; findings below may reference images not displayed]

FINDINGS: There are mildly displaced fractures involving the anterior aspects
of the right eighth and ninth ribs. No other definite rib fractures
are identified. The right lung is clear. No right-sided pneumothorax
or pleural effusion.
IMPRESSION: Right anterior eighth and ninth rib fractures.

No acute pulmonary findings.

## 2019-07-10 IMAGING — CR DG PELVIS 1-2V
1 series · 1 of 1 positions shown · non-contrast
Comparison: None.

CLINICAL DATA: Fell.

EXAM:
PELVIS - 1-2 VIEW

[pelvis ap]
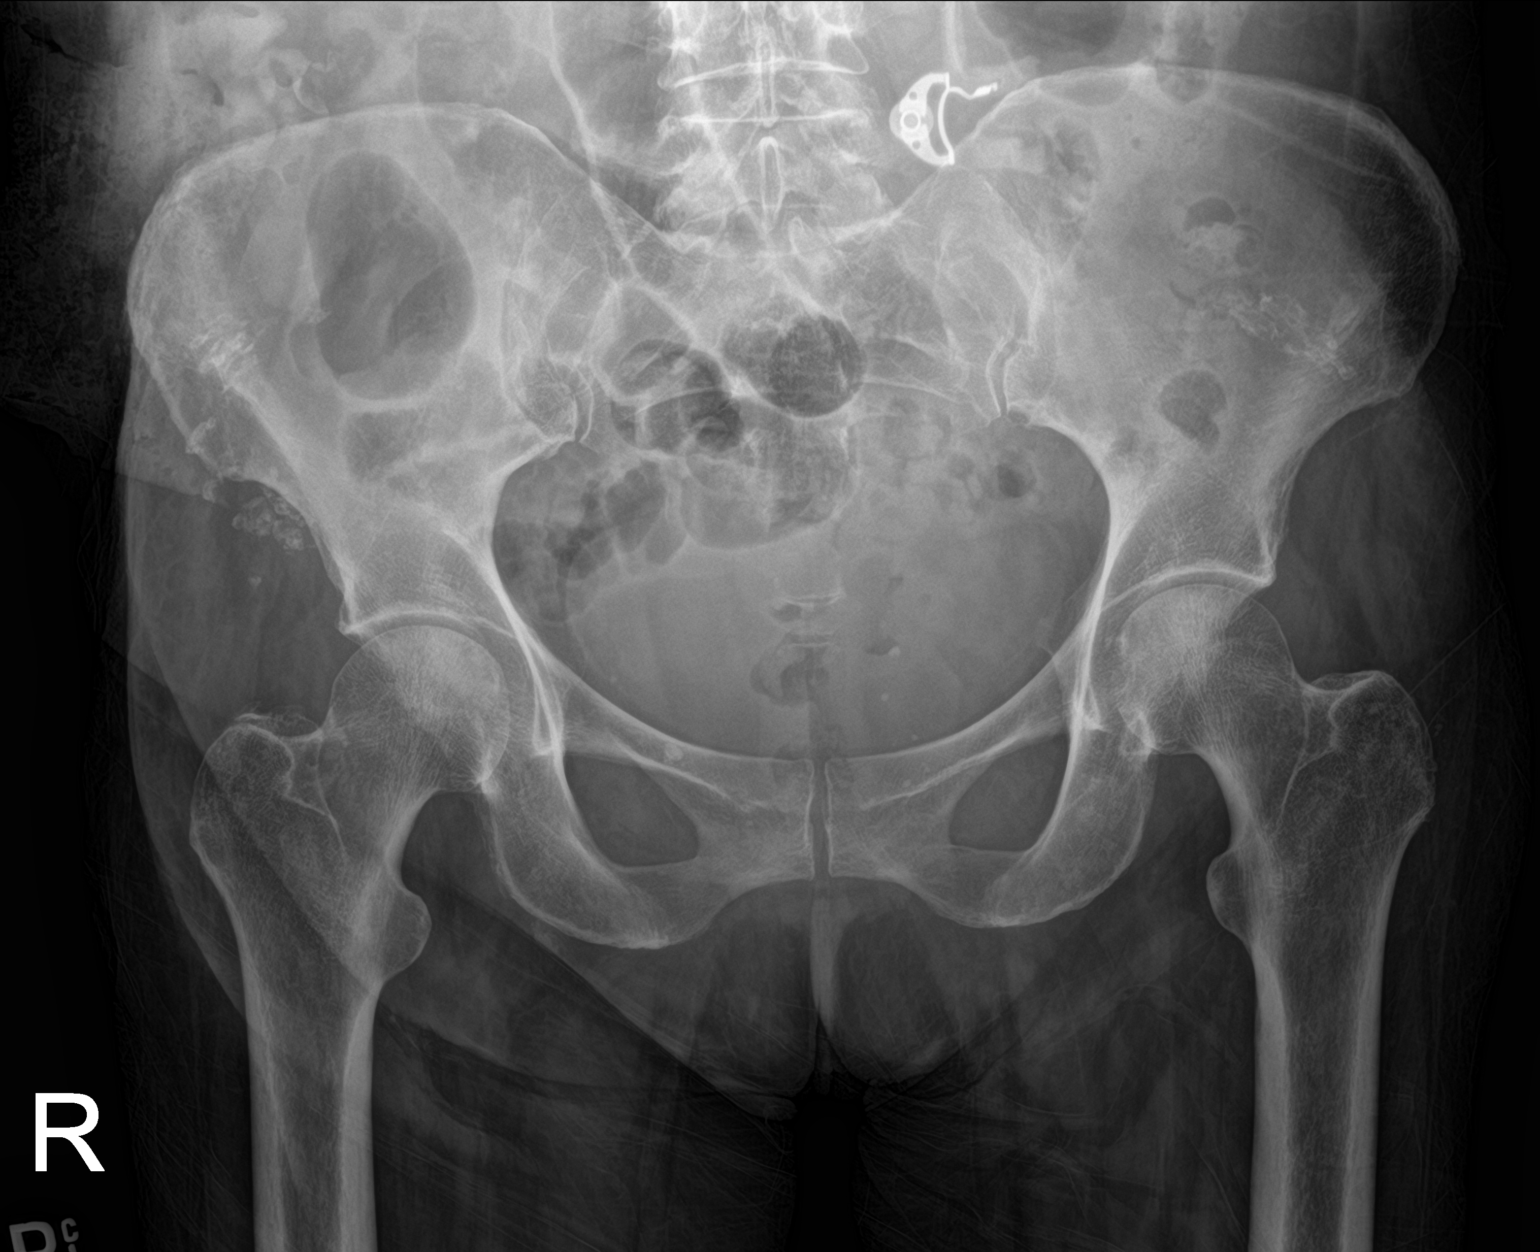

[1 of 1 positions shown; findings below may reference images not displayed]

FINDINGS: Both hips are normally located. No acute hip fracture. The pubic
symphysis and SI joints are intact. No pelvic fractures.
IMPRESSION: No acute bony findings.

## 2019-07-10 SURGERY — ENTEROSCOPY
Anesthesia: Monitor Anesthesia Care

## 2019-07-10 MED ORDER — SPOT INK MARKER SYRINGE KIT
PACK | SUBMUCOSAL | Status: AC
  Filled 2019-07-10: qty 5

## 2019-07-10 MED ORDER — SODIUM CHLORIDE 0.9% FLUSH
3.0000 mL | Freq: Two times a day (BID) | INTRAVENOUS | Status: DC
  Administered 2019-07-11 – 2019-07-13 (×3): 3 mL via INTRAVENOUS

## 2019-07-10 MED ORDER — POTASSIUM CHLORIDE 10 MEQ/100ML IV SOLN
10.0000 meq | INTRAVENOUS | Status: DC
  Administered 2019-07-10 (×3): 10 meq via INTRAVENOUS
  Filled 2019-07-10 (×3): qty 100

## 2019-07-10 MED ORDER — SODIUM CHLORIDE 0.9 % IV SOLN
INTRAVENOUS | Status: DC | PRN
  Administered 2019-07-10 (×2): via INTRAVENOUS

## 2019-07-10 MED ORDER — POTASSIUM CHLORIDE CRYS ER 20 MEQ PO TBCR
20.0000 meq | EXTENDED_RELEASE_TABLET | Freq: Once | ORAL | Status: AC
  Administered 2019-07-10: 20 meq via ORAL
  Filled 2019-07-10: qty 1

## 2019-07-10 MED ORDER — SODIUM CHLORIDE 0.9 % IV SOLN
INTRAVENOUS | Status: DC
  Administered 2019-07-10 – 2019-07-11 (×2): via INTRAVENOUS

## 2019-07-10 MED ORDER — FENTANYL CITRATE (PF) 100 MCG/2ML IJ SOLN
INTRAMUSCULAR | Status: DC | PRN
  Administered 2019-07-10 (×2): 50 ug via INTRAVENOUS

## 2019-07-10 MED ORDER — SODIUM CHLORIDE 0.9 % IV SOLN
INTRAVENOUS | Status: DC | PRN
  Administered 2019-07-10: 250 mL via INTRAVENOUS

## 2019-07-10 MED ORDER — SPOT INK MARKER SYRINGE KIT
PACK | SUBMUCOSAL | Status: DC | PRN
  Administered 2019-07-10: 1 mL via SUBMUCOSAL

## 2019-07-10 MED ORDER — LIDOCAINE 2% (20 MG/ML) 5 ML SYRINGE
INTRAMUSCULAR | Status: DC | PRN
  Administered 2019-07-10: 50 mg via INTRAVENOUS

## 2019-07-10 MED ORDER — MIDAZOLAM HCL 5 MG/5ML IJ SOLN
INTRAMUSCULAR | Status: DC | PRN
  Administered 2019-07-10 (×2): 1 mg via INTRAVENOUS

## 2019-07-10 MED ORDER — FENTANYL CITRATE (PF) 100 MCG/2ML IJ SOLN
25.0000 ug | Freq: Once | INTRAMUSCULAR | Status: AC
  Administered 2019-07-10: 25 ug via INTRAVENOUS

## 2019-07-10 MED ORDER — PEG-KCL-NACL-NASULF-NA ASC-C 100 G PO SOLR: 1.0000 | Freq: Once | ORAL | Status: DC

## 2019-07-10 MED ORDER — ONDANSETRON HCL 4 MG/2ML IJ SOLN
INTRAMUSCULAR | Status: DC | PRN
  Administered 2019-07-10: 4 mg via INTRAVENOUS

## 2019-07-10 MED ORDER — PROPOFOL 500 MG/50ML IV EMUL
INTRAVENOUS | Status: DC | PRN
  Administered 2019-07-10: 100 ug/kg/min via INTRAVENOUS

## 2019-07-10 MED ORDER — GLYCOPYRROLATE PF 0.2 MG/ML IJ SOSY
PREFILLED_SYRINGE | INTRAMUSCULAR | Status: DC | PRN
  Administered 2019-07-10: .1 mg via INTRAVENOUS

## 2019-07-10 MED ORDER — ALBUTEROL SULFATE (2.5 MG/3ML) 0.083% IN NEBU
3.0000 mL | INHALATION_SOLUTION | RESPIRATORY_TRACT | Status: DC | PRN
  Administered 2019-07-10: 3 mL via RESPIRATORY_TRACT
  Filled 2019-07-10: qty 3

## 2019-07-10 MED ORDER — FENTANYL CITRATE (PF) 100 MCG/2ML IJ SOLN
INTRAMUSCULAR | Status: AC
  Filled 2019-07-10: qty 2

## 2019-07-10 MED ORDER — KETOROLAC TROMETHAMINE 15 MG/ML IJ SOLN
15.0000 mg | Freq: Once | INTRAMUSCULAR | Status: AC
  Administered 2019-07-10: 15 mg via INTRAVENOUS
  Filled 2019-07-10: qty 1

## 2019-07-10 MED ORDER — PEG-KCL-NACL-NASULF-NA ASC-C 100 G PO SOLR
0.5000 | Freq: Once | ORAL | Status: AC
  Administered 2019-07-10: 100 g via ORAL
  Filled 2019-07-10: qty 1

## 2019-07-10 MED ORDER — PROPOFOL 10 MG/ML IV BOLUS
INTRAVENOUS | Status: DC | PRN
  Administered 2019-07-10: 40 mg via INTRAVENOUS
  Administered 2019-07-10: 60 mg via INTRAVENOUS

## 2019-07-10 MED ORDER — PEG-KCL-NACL-NASULF-NA ASC-C 100 G PO SOLR
0.5000 | Freq: Once | ORAL | Status: AC
  Administered 2019-07-11: 100 g via ORAL
  Filled 2019-07-10: qty 1

## 2019-07-10 SURGICAL SUPPLY — 15 items

## 2019-07-10 NOTE — Progress Notes (Signed)
MD on call paged that patient lungs wheezing and home Albuterol is not ordered. Arthor Captain LPN

## 2019-07-10 NOTE — Evaluation (Addendum)
Physical Therapy Evaluation Patient Details Name: Brianna Porter MRN: [id number] DOB: [birth year] Today's Date: 07/10/2019   History of Present Illness  pt admitted after presenting to [facility] ED after fall and dark stools at home, hemoglobin on admission at 6.0, recent hospitilization for GI bleed with upper endoscopy 11/10, EGD on 07/10/19, imaging shows 8th and 9th rib fractures, PMH includes GI bleed, a fib, hyperthyroidism, anxiety, CHF, anemia  Clinical Impression  Pt is a 58 yo female admitted for above. Pt sitting EOB upon arrival and agreed to participate with PT. Pt reported severe pain in lumbar spine from hitting it on the toilet when she fell Wednesday night at home. Pt very painful and tearful throughout session. Pt stated "I feel very depressed" but did not want chaplain or any other support services. pt reports living with multiple family members who are able to provide assistance 24/7. Pt previously was independent with all ADLs and ambulating without an AD until recently when she started getting sick and needed a cane. Pt reports having several falls recently due to weakness. Pt min guard to min A for functional mobility during session mostly limited by pain. Pt presents with pain, decreased strength, balance and activity tolerance limiting functional mobility. Pt would benefit from skilled acute therapy to improve noted deficits. Recommendation for home health PT following hospital discharge to further improve deficits, allow for return to PLOF and decrease fall risk.     Follow Up Recommendations Home health PT    Equipment Recommendations  Rolling walker with 5" wheels;3in1 (PT)    Recommendations for Other Services       Precautions / Restrictions Precautions Precautions: Fall Restrictions Weight Bearing Restrictions: No      Mobility  Bed Mobility Overal bed mobility: Needs Assistance Bed Mobility: Supine to Sit;Sit to Supine     Supine to sit: Min assist;HOB  elevated Sit to supine: Min assist;HOB elevated   General bed mobility comments: min educated on log rolling technique and required cuing throughout, min A for trunk elevation and for LE advancement with return to supine, pt utilized increased time, effort, bed rails, very painful  Transfers Overall transfer level: Needs assistance Equipment used: Rolling walker (2 wheeled) Transfers: Sit to/from Stand Sit to Stand: Min guard         General transfer comment: min guard for safety, cuing for hand placement and use of RW, pt very painful and stands with increased trunk flexion  Ambulation/Gait Ambulation/Gait assistance: Min guard Gait Distance (Feet): 15 Feet Assistive device: Rolling walker (2 wheeled) Gait Pattern/deviations: Step-through pattern;Decreased stride length;Antalgic;Trunk flexed Gait velocity: decreased   General Gait Details: pt ambulated with very slow, guarded and antalgic gait pattern frequently tearing up during ambulation, pt antalgic due to pain with increased WBing through RLE, pt reliant on UE support from RW, no instances of buckling or LOB  Stairs            Wheelchair Mobility    Modified Rankin (Stroke Patients Only)       Balance Overall balance assessment: Needs assistance;History of Falls Sitting-balance support: Feet supported Sitting balance-Leahy Scale: Good Sitting balance - Comments: steady sitting EOB   Standing balance support: Bilateral upper extremity supported;During functional activity Standing balance-Leahy Scale: Poor Standing balance comment: pt reliant on UE support frmo RW                             Pertinent Vitals/Pain Pain Score: 8  Pain Location: low back Pain Descriptors / Indicators: Sharp;Aching;Grimacing;Crying Pain Intervention(s): Limited activity within patient's tolerance;Monitored during session;Repositioned    Home Living Family/patient expects to be discharged to:: Private  residence Living Arrangements: Children;Other relatives Available Help at Discharge: Family;Friend(s);Available 24 hours/day Type of Home: House Home Access: Stairs to enter Entrance Stairs-Rails: Right;Left(cant reach both for front steps, back steps can reach both) Entrance Stairs-Number of Steps: 2 Home Layout: Two level Home Equipment: Grab bars - tub/shower;Shower seat;Cane - single point;Hand held Tourist information centre manager - 2 wheels      Prior Function Level of Independence: Independent         Comments: independent prior, once started getting sick daughter would help dressing, bathing, clean up after diarrhea or vomiting, daughter does cooking and housework, previously ambulating with no AD since getting sick ambulating with cane, pt reports having several falls over last three months due to weakness     Hand Dominance        Extremity/Trunk Assessment   Upper Extremity Assessment Upper Extremity Assessment: Generalized weakness;Defer to OT evaluation    Lower Extremity Assessment Lower Extremity Assessment: Generalized weakness(B LE grossly 3/5, RLE testing limited by pain in Right lumbar region)    Cervical / Trunk Assessment Cervical / Trunk Assessment: Kyphotic  Communication   Communication: No difficulties  Cognition Arousal/Alertness: Awake/alert Behavior During Therapy: WFL for tasks assessed/performed Overall Cognitive Status: Within Functional Limits for tasks assessed                                        General Comments General comments (skin integrity, edema, etc.): multiple bruises on B UEs and LEs and face    Exercises     Assessment/Plan    PT Assessment Patient needs continued PT services  PT Problem List Decreased strength;Decreased mobility;Decreased safety awareness;Decreased range of motion;Decreased activity tolerance;Decreased balance;Decreased knowledge of use of DME;Pain;Cardiopulmonary status limiting activity        PT Treatment Interventions DME instruction;Therapeutic exercise;Gait training;Balance training;Stair training;Functional mobility training;Therapeutic activities;Patient/family education    PT Goals (Current goals can be found in the Care Plan section)  Acute Rehab PT Goals Patient Stated Goal: feel better PT Goal Formulation: With patient/family Time For Goal Achievement: 07/23/19 Potential to Achieve Goals: Good    Frequency Min 3X/week   Barriers to discharge        Co-evaluation               AM-PAC PT "6 Clicks" Mobility  Outcome Measure Help needed turning from your back to your side while in a flat bed without using bedrails?: A Little Help needed moving from lying on your back to sitting on the side of a flat bed without using bedrails?: A Little Help needed moving to and from a bed to a chair (including a wheelchair)?: A Little Help needed standing up from a chair using your arms (e.g., wheelchair or bedside chair)?: A Little Help needed to walk in hospital room?: A Little Help needed climbing 3-5 steps with a railing? : A Lot 6 Click Score: 17    End of Session Equipment Utilized During Treatment: Gait belt Activity Tolerance: Patient limited by pain Patient left: in bed;with call bell/phone within reach Nurse Communication: Mobility status PT Visit Diagnosis: Other abnormalities of gait and mobility (R26.89)    Time: 1342-1410 PT Time Calculation (min) (ACUTE ONLY): 28 min   Charges:  PT Evaluation $PT Eval Moderate Complexity: 1 Mod PT Treatments $Therapeutic Exercise: 8-22 mins        Nancy Arvin PT, DPT 3:20 PM,07/10/19    Amalie Koran Drucilla Chalet 07/10/2019, 3:14 PM

## 2019-07-10 NOTE — Progress Notes (Signed)
OT Cancellation Note  Patient Details Name: Brianna Porter MRN: [id number] DOB: Aug 23, 1960   Cancelled Treatment:    Reason Eval/Treat Not Completed: Patient at procedure or test/ unavailable (EGD). Will follow up for OT eval as able.  Golden Circle, OTR/L Acute Rehab Services Pager [phone] Office [phone]     Almon Register 07/10/2019, 9:10 AM

## 2019-07-10 NOTE — Anesthesia Procedure Notes (Signed)
Procedure Name: MAC Date/Time: 07/10/2019 9:36 AM Performed by: Orlie Dakin, CRNA Pre-anesthesia Checklist: Emergency Drugs available, Patient identified, Suction available and Patient being monitored Patient Re-evaluated:Patient Re-evaluated prior to induction Oxygen Delivery Method: Nasal cannula Preoxygenation: Pre-oxygenation with 100% oxygen Induction Type: IV induction Placement Confirmation: positive ETCO2

## 2019-07-10 NOTE — Interval H&P Note (Signed)
History and Physical Interval Note:  07/10/2019 9:24 AM  [name]  has presented today for surgery, with the diagnosis of GI BLEED.  ANEMIA.  The various methods of treatment have been discussed with the patient and family. After consideration of risks, benefits and other options for treatment, the patient has consented to  Procedure(s): ENTEROSCOPY (N/A) as a surgical intervention.  The patient's history has been reviewed, patient examined, no change in status, stable for surgery.  I have reviewed the patient's chart and labs.  Questions were answered to the patient's satisfaction.     [name]

## 2019-07-10 NOTE — Progress Notes (Signed)
PT Cancellation Note  Patient Details Name: Alexxis Fleurimond MRN: [id number] DOB: 1960-12-03   Cancelled Treatment:    Reason Eval/Treat Not Completed: Patient at procedure or test/unavailable(EGD). Will follow up for PT eval as able.   Zachary George PT, DPT 8:22 AM,07/10/19   Mickeal Daws Drucilla Chalet 07/10/2019, 8:21 AM

## 2019-07-10 NOTE — Op Note (Signed)
[facility] Patient Name: Brianna Porter Procedure Date : 07/10/2019 MRN: [id number] Attending MD: Estill Cotta. Danis , MD Date of Birth: 07-21-1961 CSN: [id number] Age: 58 Admit Type: Inpatient Procedure:                Upper GI endoscopy Indications:              Acute post hemorrhagic anemia, Melena, Chronic                            gastric ulcer with hemorrhage (several small                            clean-based ulcers recently seen and suspected to                            have been cause of melena at that time. Patient                            returned with greater degree of melena and                            decreasing hemoglobin after Eliquis resumed) Providers:                Mallie Mussel L. Loletha Carrow, MD, Ashley Jacobs, RN, Marguerita Merles, Technician Referring MD:             [facility] Medicine Teaching Service Medicines:                Monitored Anesthesia Care Complications:            No immediate complications. Estimated Blood Loss:     Estimated blood loss: none. Procedure:                Pre-Anesthesia Assessment:                           - Prior to the procedure, a History and Physical                            was performed, and patient medications and                            allergies were reviewed. The patient's tolerance of                            previous anesthesia was also reviewed. The risks                            and benefits of the procedure and the sedation                            options and risks were discussed with the patient.  All questions were answered, and informed consent                            was obtained. Prior Anticoagulants: The patient has                            taken Eliquis (apixaban), last dose was 2 days                            prior to procedure. ASA Grade Assessment: III - A                            patient with severe systemic disease. After                          reviewing the risks and benefits, the patient was                            deemed in satisfactory condition to undergo the                            procedure.                           After obtaining informed consent, the endoscope was                            passed under direct vision. Throughout the                            procedure, the patient's blood pressure, pulse, and                            oxygen saturations were monitored continuously. The                            [id number]) Olympus pediatric colonscope                            was introduced through the mouth, and advanced to                            the proximal jejunum. The upper GI endoscopy was                            accomplished without difficulty. The patient                            tolerated the procedure well. Scope In: Scope Out: Findings:      The esophagus was normal.      Few non-bleeding superficial gastric ulcers with no stigmata of bleeding       were found in the gastric antrum. They are partially healed, even       compared to recent EGD images, and do not appear to  account for bleeding.      The exam of the stomach was otherwise normal.      The examined duodenum was normal.      The examined jejunum was normal. Area was tattooed with an injection of       1 mL of Spot (carbon black). Impression:               - Normal esophagus.                           - Non-bleeding gastric ulcers with no stigmata of                            bleeding.                           - Normal examined duodenum.                           - Normal examined jejunum. Tattooed.                           - No specimens collected. Recommendation:           - Return patient to hospital ward for ongoing care.                           - Clear liquid diet.                           - Perform a colonoscopy tomorrow. Procedure Code(s):        --- Professional ---                            [id number], Esophagogastroduodenoscopy, flexible,                            transoral; with directed submucosal injection(s),                            any substance Diagnosis Code(s):        --- Professional ---                           K25.9, Gastric ulcer, unspecified as acute or                            chronic, without hemorrhage or perforation                           D62, Acute posthemorrhagic anemia                           K92.1, Melena (includes Hematochezia)                           K25.4, Chronic or unspecified gastric ulcer with  hemorrhage CPT copyright 2019 American Medical Association. All rights reserved. The codes documented in this report are preliminary and upon coder review may  be revised to meet current compliance requirements. Lucina Betty L. Loletha Carrow, MD 07/10/2019 10:04:49 AM This report has been signed electronically. Number of Addenda: 0

## 2019-07-10 NOTE — Progress Notes (Signed)
Family Medicine Teaching Service Daily Progress Note Intern Pager: [phone]  Patient name: Brianna Porter Medical record number: [id number] Date of birth: 1960-10-01 Age: 58 y.o. Gender: female  Primary Care Provider: Cher Nakai, MD Consultants: GI Code Status: Full  Pt Overview and Major Events to Date:  07/09/2019: admitted, hgb 6 > 1U pRBCs > hgb 8.8 07/10/2019: EGD  Assessment and Plan: Brianna Porter is a 58 y.o. female presenting after a fall and dark stools at home. PMH is significant for history of GI bleed, HFrEF, paroxysmal A FIB, hyperthyroidism, anxiety  Symptomatic anemia  hx of GI bleed  Hgb on admission low at 6.0, pt received 1U pRBCs, repeat hgb post transfusion improved to 8.8. This morning hgb stable at 8.3. Pt to have EGD and small bowel exam this morning, to have colonoscopy tomorrow. NPO at midnight, CLD today. -GI following, appreciate recommendations -Hold Eliquis -NONSAIDs -IV Protonix twice daily -Daily CBC  AMS- resolved Pt has since returned to baseline, AOx4. Appears she was accutely altered due to xanax intoxication. Pt has many bruises and scrapes from falling, potentially due to intoxication. -CIWA protocol given benzo use -Decrease Xanax on discharge, would likely benefit from coming off completely -PT/OT eval and treat  Hypokalemia Admission potassium 2.6, received repletion yesterday via IV and PO when approved for CLD. Today K is still low at 3.3. Will replete today with IV while NPO, oral repletion after procedure. - Replete as needed  - Daily BMP  Anxiety  Home medications include Xanax 0.5 mg twice daily and Cymbalta 60 mg twice daily.  -Restart cymbalta as AKI resolved -Continued home dosing of xanax, appears pt has scheduled twice daily but also can take PRN more often. Will be on scheduled twice daily only while admitted.  AKIon CKD stage III- resolved Creatinine 1.61 on admission, improved to 1.18 today. Baseline 0.8-0.9,  but that was several months ago and before afib diagnosis and repeat GIBs.>1 may be patient's new baseline. she takes Lasix 40 mg daily. Patient received 1 L bolus in the ED for low BP. Will restart home lasix as cr has improved. Monitor for signs of fluid overload given the patient also has HFrEF. Today, no edema in bilateral LEs, no JVD, no crackles on pulmonary exam. -Avoid nephrotoxic agents -Daily BMP -Encourage oral hydration -Start home lasix dose  HFrEF Pt received 1L bolus yesterday in ED. Will monitor closely for signs of volume overload. See above (AKI) -Monitor volume status after receiving bolus -Daily weights -Strict I's and O's -Restart home lasix.  ParoxysmalA. Fibwith H/O Cardioversion Home medications include Eliquis, metoprolol, amiodarone.  -Hold Eliquis for acute GI bleed. -Continue amiodarone 200 mg twice daily, hold if MAP<60 -Continue metoprolol 50 mg twice daily, hold if MAP<60  Hyperthyroidism Asymptomatic. Home medication methimazole 5 mg nightly -Continue methimazole  Osteoarthritis Patient has been taking tylenol, ibuprofen 400 mg daily long term, and tramadol 50 mg four times daily PRN. -Continue tylenol 650 mg Q6H PRN -Continue tramadol 50 mg Q12H PRN for pain   FEN/GI: Replete electrolytes as needed, NPO pending EGD Prophylaxis: SCDs  Disposition: To med-surg pending EGD and PT/OT evaluation today  Subjective:  Patient very tired after procedure, nervous for colonoscopy tomorrow. No respiratory symptoms, lungs clear on exam, no LE edema.  Objective: Temp:  [98.1 F (36.7 C)-99.8 F (37.7 C)] 99.8 F (37.7 C) (11/20 0015) Pulse Rate:  [70-94] 94 (11/20 0015) Resp:  [13-21] 16 (11/20 0015) BP: (98-165)/(41-80) 137/64 (11/20 0015) SpO2:  [94 %-100 %]  99 % (11/20 0015) Physical Exam: General: older woman, ill-appearing, resting comfortably in bed, NAD Cardiovascular: RRR, no m/r/g Respiratory: CTAB, no increased WOB, no  rales/rhonchi Abdomen: soft, NT, ND, normal bowel sounds present Extremities: no LE edema  Laboratory: Recent Labs  Lab 07/09/19 0234 07/09/19 0759 07/09/19 1252 07/10/19 0415  WBC 11.9* 11.1*  --  8.2  HGB 6.0* 5.9* 8.8* 8.3*  HCT 19.7* 19.4* 27.2* 25.2*  PLT 535* 487*  --  571*   Recent Labs  Lab 07/09/19 0234 07/09/19 0759 07/09/19 1252 07/10/19 0415  NA 131* 136 135 139  K 2.6* 2.9* 3.8 3.3*  CL 97* 106 106 105  CO2 20* 21* 20* 22  BUN 24* 19 15 13   CREATININE 1.61* 1.34* 1.23* 1.18*  CALCIUM 8.2* 7.8* 8.2* 8.8*  PROT 5.0*  --   --   --   BILITOT 0.9  --   --   --   ALKPHOS 84  --   --   --   ALT 36  --   --   --   AST 27  --   --   --   GLUCOSE 115* 87 99 95    Imaging/Diagnostic Tests: Ct Head Wo Contrast  Result Date: 07/09/2019 CLINICAL DATA:  Altered level of consciousness, unexplained EXAM: CT HEAD WITHOUT CONTRAST TECHNIQUE: Contiguous axial images were obtained from the base of the skull through the vertex without intravenous contrast. COMPARISON:  05/01/2019 FINDINGS: Brain: No evidence of acute infarction, hemorrhage, hydrocephalus, extra-axial collection or mass lesion/mass effect. Mild white matter disease better seen by interval brain MRI. Normal brain volume. Vascular: No hyperdense vessel or unexpected calcification. Skull: Normal. Negative for fracture or focal lesion. Sinuses/Orbits: No acute finding. Other: Artifact from temperature probe over the forehead. IMPRESSION: No acute or interval finding. Electronically Signed   By: Monte Fantasia M.D.   On: 07/09/2019 04:42   Dg Chest Port 1 View  Result Date: 07/09/2019 CLINICAL DATA:  Shortness of breath EXAM: PORTABLE CHEST 1 VIEW COMPARISON:  Radiograph 06/28/2019 FINDINGS: The cardiomediastinal contours are unchanged with borderline cardiomegaly. The lungs are clear. Pulmonary vasculature is normal. No consolidation, pleural effusion, or pneumothorax. No acute osseous abnormalities are seen.  IMPRESSION: No acute findings. Electronically Signed   By: Keith Rake M.D.   On: 07/09/2019 04:21     [name], MD 07/10/2019, 5:45 AM PGY-1, [place] Intern pager: [phone], text pages welcome

## 2019-07-10 NOTE — Progress Notes (Signed)
OT Cancellation Note  Patient Details Name: Brianna Porter MRN: [id number] DOB: [birth year]   Cancelled Treatment:    Reason Eval/Treat Not Completed: Other (comment). In to see patient and she got a phone call from grandson right as we were getting started. Her grandson (58 yo) was here and they would not let him come up to see her (evidently they had before but did not ask his age). She was quite upset and frustrated with this and being here. I asked her if she would like for me to leave as she was still talking to her grandson and asked that I please do. RN aware.  Golden Circle, OTR/L Acute Rehab Services Pager [phone] Office [phone]     Almon Register 07/10/2019, 2:45 PM

## 2019-07-10 NOTE — Transfer of Care (Signed)
Immediate Anesthesia Transfer of Care Note  Patient: Brianna Porter  Procedure(s) Performed: ENTEROSCOPY (N/A ) SUBMUCOSAL TATTOO INJECTION  Patient Location: Endoscopy Unit  Anesthesia Type:MAC  Level of Consciousness: awake  Airway & Oxygen Therapy: Patient Spontanous Breathing and Patient connected to nasal cannula oxygen  Post-op Assessment: Report given to RN and Post -op Vital signs reviewed and stable  Post vital signs: Reviewed and stable  Last Vitals:  Vitals Value Taken Time  BP 163/62 07/10/19 0952  Temp    Pulse 95 07/10/19 0952  Resp 17 07/10/19 0952  SpO2 100 % 07/10/19 0952  Vitals shown include unvalidated device data.  Last Pain:  Vitals:   07/10/19 0842  TempSrc:   PainSc: 2       Patients Stated Pain Goal: 5 (00/51/10 2111)  Complications: No apparent anesthesia complications

## 2019-07-10 NOTE — Anesthesia Postprocedure Evaluation (Signed)
Anesthesia Post Note  Patient: Brianna Porter  Procedure(s) Performed: ENTEROSCOPY (N/A ) SUBMUCOSAL TATTOO INJECTION     Patient location during evaluation: PACU Anesthesia Type: MAC Level of consciousness: awake and alert Pain management: pain level controlled Vital Signs Assessment: post-procedure vital signs reviewed and stable Respiratory status: spontaneous breathing, nonlabored ventilation, respiratory function stable and patient connected to nasal cannula oxygen Cardiovascular status: stable and blood pressure returned to baseline Postop Assessment: no apparent nausea or vomiting Anesthetic complications: no    Last Vitals:  Vitals:   07/10/19 0952 07/10/19 1002  BP: (!) 163/62 (!) 184/72  Pulse: 94 90  Resp: 16 19  Temp: 36.5 C   SpO2: 96% 100%    Last Pain:  Vitals:   07/10/19 0952  TempSrc: Temporal  PainSc: 0-No pain                 Tilley Faeth

## 2019-07-10 NOTE — Anesthesia Preprocedure Evaluation (Addendum)
Anesthesia Evaluation  Patient identified by MRN, date of birth, ID band Patient awake    Reviewed: Allergy & Precautions, NPO status , Patient's Chart, lab work & pertinent test results, reviewed documented beta blocker date and time   Airway Mallampati: III  TM Distance: <3 FB Neck ROM: Full   Comment: ANTERIOR Dental no notable dental hx. (+) Poor Dentition ANTERIOR:   Pulmonary former smoker,    Pulmonary exam normal breath sounds clear to auscultation       Cardiovascular hypertension, Pt. on medications and Pt. on home beta blockers + CAD and +CHF (EF 20-25%)  Normal cardiovascular exam Rhythm:Regular Rate:Normal  Echo 04/29/2019  1. The left ventricle has severely reduced systolic function, with an ejection fraction of 20-25%. The cavity size was normal. Left ventrical global hypokinesis without regional wall motion abnormalities.  2. The right ventricle has severely reduced systolic function. The cavity was normal. There is no increase in right ventricular wall thickness. Right ventricular systolic pressure is normal.  3. No evidence of a thrombus present in the left atrial appendage.  4. Mild mitral insufficiency. The jet is centrally-directed.  5. The aortic root, ascending aorta, aortic arch and descending aorta are normal in size and structure.  6. No intracardiac thrombi or masses were visualized.   Neuro/Psych Depression    GI/Hepatic negative GI ROS, Neg liver ROS,   Endo/Other  diabetes, Type 2Hyperthyroidism   Renal/GU Renal diseaseCr 1.33 K+ 3.3     Musculoskeletal   Abdominal   Peds  Hematology  (+) Blood dyscrasia, anemia , Plt 533 Hgb 10.2   Anesthesia Other Findings   Reproductive/Obstetrics                            Lab Results  Component Value Date   WBC 8.2 07/10/2019   HGB 8.3 (L) 07/10/2019   HCT 25.2 (L) 07/10/2019   MCV 89.7 07/10/2019   PLT 571 (H) 07/10/2019    Lab Results  Component Value Date   CREATININE 1.18 (H) 07/10/2019   BUN 13 07/10/2019   NA 139 07/10/2019   K 3.3 (L) 07/10/2019   CL 105 07/10/2019   CO2 22 07/10/2019     Anesthesia Physical  Anesthesia Plan  ASA: IV  Anesthesia Plan: MAC   Post-op Pain Management:    Induction: Intravenous  PONV Risk Score and Plan: 3 and Treatment may vary due to age or medical condition, Ondansetron and Propofol infusion  Airway Management Planned: Mask and Natural Airway  Additional Equipment:   Intra-op Plan:   Post-operative Plan:   Informed Consent: I have reviewed the patients History and Physical, chart, labs and discussed the procedure including the risks, benefits and alternatives for the proposed anesthesia with the patient or authorized representative who has indicated his/her understanding and acceptance.     Dental advisory given  Plan Discussed with: CRNA and Anesthesiologist  Anesthesia Plan Comments:         Anesthesia Quick Evaluation

## 2019-07-11 LAB — BASIC METABOLIC PANEL
Anion gap: 15 (ref 5–15)
BUN: 10 mg/dL (ref 6–20)
CO2: 21 mmol/L — ABNORMAL LOW (ref 22–32)
Calcium: 9 mg/dL (ref 8.9–10.3)
Chloride: 99 mmol/L (ref 98–111)
Creatinine, Ser: 1.2 mg/dL — ABNORMAL HIGH (ref 0.44–1.00)
GFR calc Af Amer: 58 mL/min — ABNORMAL LOW (ref >60)
GFR calc non Af Amer: 50 mL/min — ABNORMAL LOW (ref >60)
Glucose, Bld: 110 mg/dL — ABNORMAL HIGH (ref 70–99)
Potassium: 3.8 mmol/L (ref 3.5–5.1)
Sodium: 135 mmol/L (ref 135–145)

## 2019-07-11 LAB — CBC
HCT: 25.9 % — ABNORMAL LOW (ref 36.0–46.0)
Hemoglobin: 8.5 g/dL — ABNORMAL LOW (ref 12.0–15.0)
MCH: 29.7 pg (ref 26.0–34.0)
MCHC: 32.8 g/dL (ref 30.0–36.0)
MCV: 90.6 fL (ref 80.0–100.0)
Platelets: 593 10*3/uL — ABNORMAL HIGH (ref 150–400)
RBC: 2.86 MIL/uL — ABNORMAL LOW (ref 3.87–5.11)
RDW: 23.2 % — ABNORMAL HIGH (ref 11.5–15.5)
WBC: 10.8 10*3/uL — ABNORMAL HIGH (ref 4.0–10.5)
nRBC: 0 % (ref 0.0–0.2)

## 2019-07-11 MED ORDER — PEG-KCL-NACL-NASULF-NA ASC-C 100 G PO SOLR
0.5000 | Freq: Once | ORAL | Status: AC
  Administered 2019-07-11: 100 g via ORAL
  Filled 2019-07-11: qty 1

## 2019-07-11 MED ORDER — METOPROLOL SUCCINATE ER 50 MG PO TB24
50.0000 mg | ORAL_TABLET | Freq: Two times a day (BID) | ORAL | Status: DC
  Administered 2019-07-11 – 2019-07-13 (×5): 50 mg via ORAL
  Filled 2019-07-11 (×5): qty 1

## 2019-07-11 MED ORDER — METOPROLOL TARTRATE 5 MG/5ML IV SOLN
5.0000 mg | Freq: Once | INTRAVENOUS | Status: AC
  Administered 2019-07-11: 5 mg via INTRAVENOUS
  Filled 2019-07-11: qty 5

## 2019-07-11 NOTE — Progress Notes (Addendum)
Family Medicine Teaching Service Daily Progress Note Intern Pager: [phone]  Patient name: Brianna Porter Medical record number: [id number] Date of birth: 08-06-1961 Age: 58 y.o. Gender: female  Primary Care Provider: Cher Nakai, MD Consultants: GI Code Status: Full  Pt Overview and Major Events to Date:  07/09/2019: admitted, hgb 6 > 1U pRBCs > hgb 8.8 07/10/2019: EGD - few nonbleeding superficial gastric ulcers, partially healed with no stigmata of recent bleeding, likely not source of bleeding; R rib XR: R ant 8th-9th rib fractures; Pelvic XR: neg 07/12/2019: Colonoscopy  Assessment and Plan: Brianna Porter is a 58 y.o. female  presenting after a fall and dark stools at home. PMH is significant for history of GI bleed, HFrEF, paroxysmal A FIB, hyperthyroidism, anxiety  Symptomatic anemia, improved  hx of GI bleed  Hgb stable at 8.5>8.6 this AM. S/p EGD on 11/20. Plan for colonoscopy today to evaluate for source of bleed.  - GI following, appreciate recs - advance diet per GI - Hold Eliquis - avoid NSAIDS - IV protonix BID - daily CBC - PT/OT: HH PT, rolling walker, no OT f/u - SCDs  ParoxysmalA. Fibwith H/O Cardioversion Had elevated HR to 150's over the course of yesterday with tele revealing A-fib with RVR. Home medications include Eliquis, metoprolol, amiodarone. Home Metoprolol initially held due to NPO/hypotension on admission. Home Metoprolol 50mg  BID restarted + 5mg  IV x 1 with improvement in heart rate. HR 80's overnight. BP stable overnight (120/70's) but more elevated this morning (173/67).  - continue to hold Eliquis for acute GI bleed - continue Amiodarone 200mg  BD, hold if MAP<60 - Continue home Metoprolol 50mg  BID, hold if MAP<60 - continue to monitor BP  Rib Fractures  Fall  Right 8th and 9th rib fracture noted on x-ray. Very minimal bruising on exam. Pain well controlled with home Tramadol. No incentive spirometry in room. Nurse notified. - Continue home  tramadol for pain - Albuterol inhaler every 4 hours as needed - Incentive spirometry every 2 hours - Continue to monitor respiratory status  AMS- resolved Continues to remain alert and orientedx4. Etiology of altered mental status likely due to Xanax intoxication.  PMP review, patient has been getting increasingly more Xanax.  -Decrease Xanax on discharge, would likely benefit from coming off completely -PT/OT eval and treat  Hypokalemia K 3.1 this AM. - 68mEq IV K x4 this AM given NPO + 60mEq Kdur this evening - Replete as needed  - Daily BMP  Anxiety  Home medications include Xanax 0.5 mg twice daily and Cymbalta 60 mg twice daily. PMP review, patient has been getting increasingly more Xanax.  -Restart cymbalta as AKI resolved -Continued home dosing of xanax  AKIon CKD stage III- resolved Cr 1.61 on admission, 1.06 this AM.New baseline may be >1 with previous baseline ~0.9.  - Daily BMP - Encourage oral hydration - continue to monitor given initiation of home Lasix  HFrEF, EF 20-25% Appears euvolemic on exam. 0.82mL UOP in the last 24 hours. Home meds: Lasix 40mg  QD, Metoprolol 50mg  BID.  - continue home meds - daily weights, strict I/O's - daily BMP  Hyperthyroidism Home meds: Methimazole 5mg  qHS. - continue home meds  Osteoarthritis Home meds include: Tylenol, Ibuprofen 400mg  QD, Tramadol 50mg  QID PRN.  - continue Tylenol 650mg  q6h PRN - Continue Tramadol 50mg  q12H PRN for pain - Avoid NSAIDs  FEN/GI: Replete electrolytes as needed,  advance diet per GI Prophylaxis: SCDs  Disposition: Likely Home with home health, pending colonoscopy  Subjective:  Brianna Porter doing well this AM. Denies any concerns this AM. No acute events overnight.   Objective: Temp:  [97.5 F (36.4 C)-98.9 F (37.2 C)] 97.9 F (36.6 C) (11/22 0424) Pulse Rate:  [80-155] 84 (11/22 0424) Resp:  [16-20] 18 (11/22 0424) BP: (119-173)/(61-100) 173/67 (11/22 0424) SpO2:  [96 %-100  %] 100 % (11/22 0424) Weight:  [57.9 kg] 57.9 kg (11/22 0424) Physical Exam: General: pleasant older lady, resting comfortably in bed, in no acute distress with non-toxic appearance CV: regular rate and rhythm without murmurs, rubs, or gallops Lungs: clear to auscultation bilaterally with normal work of breathing Skin: warm, dry Extremities: warm and well perfused Neuro: Alert and orientedx4, speech normal  Laboratory: Recent Labs  Lab 07/10/19 0415 07/11/19 0427 07/12/19 0405  WBC 8.2 10.8* 7.2  HGB 8.3* 8.5* 8.6*  HCT 25.2* 25.9* 26.4*  PLT 571* 593* 600*   Recent Labs  Lab 07/09/19 0234  07/10/19 0415 07/11/19 0427 07/12/19 0405  NA 131*   < > 139 135 136  K 2.6*   < > 3.3* 3.8 3.1*  CL 97*   < > 105 99 103  CO2 20*   < > 22 21* 22  BUN 24*   < > 13 10 7   CREATININE 1.61*   < > 1.18* 1.20* 1.06*  CALCIUM 8.2*   < > 8.8* 9.0 9.1  PROT 5.0*  --   --   --   --   BILITOT 0.9  --   --   --   --   ALKPHOS 84  --   --   --   --   ALT 36  --   --   --   --   AST 27  --   --   --   --   GLUCOSE 115*   < > 95 110* 95   < > = values in this interval not displayed.    Imaging/Diagnostic Tests: No results found.    Mina Marble [place], DO 07/12/2019, 6:03 AM  PGY-2, [place] Intern pager: [phone], text pages welcome

## 2019-07-11 NOTE — Progress Notes (Signed)
FPTS Interim Progress Note  RN paged this provider with report that pt had HR in 150s. Pt has known paroxysmal A. Fib. Has home dose of metoprolol 50mg  BID, held for NPO/hypotension on admission. Reviewed tele, A. Fib w/ RVR. Reordered patient's home metoprolol dose. Patient doing well, talking comfortably, no diaphoresis or chest pain. RN gave pt metoprolol, will check back in to ensure rate control.  [name], MD 07/11/2019, 3:00 PM PGY-1, [place] Medicine Service pager [phone]

## 2019-07-11 NOTE — Progress Notes (Signed)
   07/11/19 1449  MEWS Assessment  Is this an acute change? Yes  Provider Notification  Provider Name/Title Mahoney MD  Date Provider Notified 07/11/19  Time Provider Notified 1440  Notification Type Call  Notification Reason Change in status  Response See new orders  Date of Provider Response 07/11/19  Time of Provider Response 1440

## 2019-07-11 NOTE — Progress Notes (Signed)
Patient ID: Brianna Porter, female   DOB: 1961/08/06, 58 y.o.   MRN: [id number]    Progress Note   Subjective  Day # 3 CC; GI bleeding/melena  Enteroscopy yesterday-few nonbleeding superficial gastric ulcers no stigmata of recent bleeding, partially healed, otherwise negative  Patient was to have colonoscopy this morning however did not complete prep.  Drank 1 bottle last p.m., and trying to drink second bottle this a.m.  Stool liquid brown  Hemoglobin 8.5 this a.m. stable  Patient unhappy about procedure being rescheduled for tomorrow but after conversation she is agreeable to plan for clear liquids today, 1 bottle of movie prep this evening and colonoscopy early a.m. tomorrow.  Also wants to proceed with capsule endoscopy if indicated.      Objective   Vital signs in last 24 hours: Temp:  [97.4 F (36.3 C)-98.9 F (37.2 C)] 98.9 F (37.2 C) (11/21 0823) Pulse Rate:  [81-116] 109 (11/21 0823) Resp:  [15-19] 16 (11/21 0823) BP: (122-184)/(50-72) 170/62 (11/21 0823) SpO2:  [96 %-100 %] 99 % (11/21 0823) Last BM Date: 07/10/19 General:   Older white female in NAD Heart:  Regular rate and rhythm; no murmurs Lungs: Respirations even and unlabored, lungs CTA bilaterally Abdomen:  Soft, nontender and nondistended. Normal bowel sounds. Extremities:  Without edema. Neurologic:  Alert and oriented,  grossly normal neurologically. Psych:  Cooperative. Normal mood and affect.  Intake/Output from previous day: 11/20 0701 - 11/21 0700 In: 810 [P.O.:460; I.V.:350] Out: 1400 [Urine:1400] Intake/Output this shift: No intake/output data recorded.  Lab Results: Recent Labs    07/09/19 0759 07/09/19 1252 07/10/19 0415 07/11/19 0427  WBC 11.1*  --  8.2 10.8*  HGB 5.9* 8.8* 8.3* 8.5*  HCT 19.4* 27.2* 25.2* 25.9*  PLT 487*  --  571* 593*   BMET Recent Labs    07/09/19 1252 07/10/19 0415 07/11/19 0427  NA 135 139 135  K 3.8 3.3* 3.8  CL 106 105 99  CO2 20* 22 21*  GLUCOSE  99 95 110*  BUN 15 13 10   CREATININE 1.23* 1.18* 1.20*  CALCIUM 8.2* 8.8* 9.0   LFT Recent Labs    07/09/19 0234  PROT 5.0*  ALBUMIN 2.6*  AST 27  ALT 36  ALKPHOS 84  BILITOT 0.9   PT/INR Recent Labs    07/09/19 0234  LABPROT 14.7  INR 1.2    Studies/Results: Dg Ribs Unilateral Right  Result Date: 07/10/2019 CLINICAL DATA:  [place].  Right lower rib pain. EXAM: RIGHT RIBS - 2 VIEW COMPARISON:  Chest x-ray 06/28/2019 FINDINGS: There are mildly displaced fractures involving the anterior aspects of the right eighth and ninth ribs. No other definite rib fractures are identified. The right lung is clear. No right-sided pneumothorax or pleural effusion. IMPRESSION: Right anterior eighth and ninth rib fractures. No acute pulmonary findings. Electronically Signed   By: Marijo Sanes M.D.   On: 07/10/2019 15:14   Dg Pelvis 1-2 Views  Result Date: 07/10/2019 CLINICAL DATA:  [place]. EXAM: PELVIS - 1-2 VIEW COMPARISON:  None. FINDINGS: Both hips are normally located. No acute hip fracture. The pubic symphysis and SI joints are intact. No pelvic fractures. IMPRESSION: No acute bony findings. Electronically Signed   By: Marijo Sanes M.D.   On: 07/10/2019 15:17       Assessment / Plan:    #30 58 year old white female with recurrent GI bleeding and anemia in setting of chronic anticoagulation for atrial fibrillation  Recently found to have nonbleeding gastric ulcers, however  on EGD and enteroscopy yesterday these ulcers were clean-based and partially healed with no stigmata of recent bleeding and not felt to be source of her melena.  Patient was to have colonoscopy this morning but did not complete prep.  Hemoglobin stable since yesterday. Apixaban on hold  #2 Ischemic cardiomyopathy-/CHF  EF 20-25  Plan; clear liquids today, n.p.o. after midnight We will give her 1 bottle of movie prep this evening Rescheduled colonoscopy for tomorrow morning with Dr. Loletha Carrow. Continue serial  hemoglobins and transfuse for hemoglobin 7 or less.  If colonoscopy is unrevealing we will proceed with capsule endoscopy tomorrow.  Active Problems:   Anemia   Pressure injury of skin   Gastrointestinal hemorrhage     LOS: 2 days   Amy EsterwoodPA-C  07/11/2019, 9:15 AM

## 2019-07-11 NOTE — Progress Notes (Signed)
Family Medicine Teaching Service Daily Progress Note Intern Pager: [phone]  Patient name: Brianna Porter Medical record number: [id number] Date of birth: [birth year] Age: 58 y.o. Gender: female  Primary Care Provider: Cher Nakai, MD Consultants: GI Code Status: Full  Pt Overview and Major Events to Date:  07/09/2019: admitted, hgb 6 > 1U pRBCs > hgb 8.8 07/10/2019: EGD  Assessment and Plan: Brianna Porter is a 58 y.o. female  presenting after a fall and dark stools at home. PMH is significant for history of GI bleed, HFrEF, paroxysmal A FIB, hyperthyroidism, anxiety  Symptomatic anemia  hx of GI bleed  Hemoglobin today 8.5 from 8.3 yesterday s/p 1 Up pRBCs and 6.0 on admission. EGD yesterday showed nonbleeding gastric ulcers with no stigmata of bleeding.  Patient is to have colonoscopy today.  Advance diet per GI. -GI following, appreciate recommendations -Hold Eliquis -NONSAIDs -IV Protonix twice daily -Daily CBC -PT: Recommends home health PT and rolling walker -OT: Eval pending  Rib Fractures  Fall  Right rib imaging yesterday indicated right anterior eighth and ninth rib fractures.  Patient with diffuse ecchymoses and abrasions on exam.  No pelvic fractures were identified on pelvic x-rays.  CT head was negative on admission. -Continue home tramadol for pain -Albuterol inhaler Q4H as needed -Incentive spirometry -Monitor respiratory status  AMS- resolved Alert and oriented.  Etiology of altered mental status likely due to Xanax intoxication.  PMP review, patient has been getting increasingly more Xanax.  -CIWA protocol given benzo use -Decrease Xanax on discharge, would likely benefit from coming off completely -PT/OT eval and treat  Hypokalemia Potassium 3.8 again today.  Continue to replete with IV and oral potassium as needed.  - Replete as needed  - Daily BMP  Anxiety  Home medications include Xanax 0.5 mg twice daily and Cymbalta 60 mg twice daily. PMP  review, patient has been getting increasingly more Xanax.  -Restart cymbalta as AKI resolved -Continued home dosing of xanax   AKIon CKD stage III- resolved Creatinine 1.61 on admission, improved to 1.2 today. New baseline may be >1 with previous baseline ~0.9.  Lasix was resumed yesterday. -Daily BMP -Encourage oral hydration   HFrEF There are no signs of volume overload on exam.  Patient diuresed 1.4 L yesterday.  Home medications include 40 mg Lasix daily 50 mg metoprolol twice daily.. -Monitor volume status  -Daily weights -Strict I's and O's -Continue home Lasix  ParoxysmalA. Fibwith H/O Cardioversion Home medications include Eliquis, metoprolol, amiodarone.  -Hold Eliquis for acute GI bleed. -Continue amiodarone 200 mg twice daily, hold if MAP<60 -Held metoprolol 50 mg twice daily, hold if MAP<60  Hyperthyroidism Asymptomatic. Home medication methimazole 5 mg nightly -Continue methimazole  Osteoarthritis Patient has been taking tylenol, ibuprofen 400 mg daily long term, and tramadol 50 mg four times daily PRN. -Continue tylenol 650 mg Q6H PRN -Continue tramadol 50 mg Q12H PRN for pain  -No NSAIDs  FEN/GI: Replete electrolytes as needed,  advance diet per GI Prophylaxis: SCDs  Disposition: Likely Home with home health, pending colonoscopy and OT evaluation  Subjective:  Brianna Porter had no significant overnight events.   Objective: Temp:  [97.1 F (36.2 C)-98.9 F (37.2 C)] 98.9 F (37.2 C) (11/21 0619) Pulse Rate:  [81-116] 100 (11/21 0619) Resp:  [15-19] 18 (11/21 0619) BP: (122-192)/(50-72) 122/66 (11/21 0619) SpO2:  [96 %-100 %] 96 % (11/21 0619) Weight:  [62 kg] 62 kg (11/20 0810) Physical Exam:  GEN: pleasant, in no acute distress  CV: regular rate  and rhythm, no murmurs appreciated  RESP: no increased work of breathing, clear to ascultation bilaterally  ABD: Bowel sounds present. Soft, Nontender, Nondistended MSK: no edema, or  cyanosis noted      Laboratory: Recent Labs  Lab 07/09/19 0759 07/09/19 1252 07/10/19 0415 07/11/19 0427  WBC 11.1*  --  8.2 10.8*  HGB 5.9* 8.8* 8.3* 8.5*  HCT 19.4* 27.2* 25.2* 25.9*  PLT 487*  --  571* 593*   Recent Labs  Lab 07/09/19 0234  07/09/19 1252 07/10/19 0415 07/11/19 0427  NA 131*   < > 135 139 135  K 2.6*   < > 3.8 3.3* 3.8  CL 97*   < > 106 105 99  CO2 20*   < > 20* 22 21*  BUN 24*   < > 15 13 10   CREATININE 1.61*   < > 1.23* 1.18* 1.20*  CALCIUM 8.2*   < > 8.2* 8.8* 9.0  PROT 5.0*  --   --   --   --   BILITOT 0.9  --   --   --   --   ALKPHOS 84  --   --   --   --   ALT 36  --   --   --   --   AST 27  --   --   --   --   GLUCOSE 115*   < > 99 95 110*   < > = values in this interval not displayed.    Imaging/Diagnostic Tests: Dg Ribs Unilateral Right  Result Date: 07/10/2019 CLINICAL DATA:  [place].  Right lower rib pain. EXAM: RIGHT RIBS - 2 VIEW COMPARISON:  Chest x-ray 06/28/2019 FINDINGS: There are mildly displaced fractures involving the anterior aspects of the right eighth and ninth ribs. No other definite rib fractures are identified. The right lung is clear. No right-sided pneumothorax or pleural effusion. IMPRESSION: Right anterior eighth and ninth rib fractures. No acute pulmonary findings. Electronically Signed   By: Marijo Sanes M.D.   On: 07/10/2019 15:14   Dg Pelvis 1-2 Views  Result Date: 07/10/2019 CLINICAL DATA:  [place]. EXAM: PELVIS - 1-2 VIEW COMPARISON:  None. FINDINGS: Both hips are normally located. No acute hip fracture. The pubic symphysis and SI joints are intact. No pelvic fractures. IMPRESSION: No acute bony findings. Electronically Signed   By: Marijo Sanes M.D.   On: 07/10/2019 15:17      Lyndee Hensen, MD 07/11/2019, 8:45 AM  PGY-1, [place] Intern pager: [phone], text pages welcome

## 2019-07-11 NOTE — Evaluation (Signed)
Occupational Therapy Evaluation Patient Details Name: Brianna Porter MRN: [id number] DOB: 1960-09-12 Today's Date: 07/11/2019    History of Present Illness pt admitted after presenting to [facility] ED after fall and dark stools at home, hemoglobin on admission at 6.0, recent hospitilization for GI bleed with upper endoscopy 11/10, EGD on 07/10/19, PMH includes GI bleed, a fib, hyperthyroidism, anxiety, CHF, anemia   Clinical Impression   Pt PTA: Pt living with family who can provide 24/7. Pt appears to be at her functional baseline with pain in R sided ribs. Pt able to perform transfers, toilet hygiene, LB dressing and grooming task at EOB with modified independence.  No physical assist required. Pt with good carry over skills from PT session yesterday going through log roll technique without additional cueing. Education for log roll and use of pillow to brace self for positional movement with pillow. Pt does not require continued OT skilled services. OT signing off.     Follow Up Recommendations  No OT follow up;Supervision - Intermittent    Equipment Recommendations  None recommended by OT    Recommendations for Other Services       Precautions / Restrictions Precautions Precautions: Fall Restrictions Weight Bearing Restrictions: No      Mobility Bed Mobility Overal bed mobility: Needs Assistance Bed Mobility: Supine to Sit;Sit to Supine     Supine to sit: Supervision Sit to supine: Supervision   General bed mobility comments: No physical assist required. Pt with good carry over skills from PT session yesterday going through log roll technique without additional cueing.  Transfers Overall transfer level: Modified independent                    Balance Overall balance assessment: No apparent balance deficits (not formally assessed)                                         ADL either performed or assessed with clinical judgement   ADL Overall  ADL's : At baseline                                       General ADL Comments: Pt able to perform transfers, toilet hygiene, LB dressing and grooming task at EOB.      Vision Baseline Vision/History: Wears glasses Patient Visual Report: No change from baseline Vision Assessment?: No apparent visual deficits     Perception     Praxis      Pertinent Vitals/Pain Pain Assessment: 0-10 Pain Score: 4  Pain Location: low back Pain Descriptors / Indicators: Aching;Grimacing;Discomfort Pain Intervention(s): Limited activity within patient's tolerance     Hand Dominance Right   Extremity/Trunk Assessment Upper Extremity Assessment Upper Extremity Assessment: Overall WFL for tasks assessed   Lower Extremity Assessment Lower Extremity Assessment: Defer to PT evaluation;Generalized weakness   Cervical / Trunk Assessment Cervical / Trunk Assessment: Kyphotic   Communication Communication Communication: No difficulties   Cognition Arousal/Alertness: Awake/alert Behavior During Therapy: WFL for tasks assessed/performed Overall Cognitive Status: Within Functional Limits for tasks assessed                                     General Comments  Bruises on BLEs. Education for log roll  and use of pillow to brace self for positional movement with pillow.     Exercises     Shoulder Instructions      Home Living Family/patient expects to be discharged to:: Private residence Living Arrangements: Children;Other relatives Available Help at Discharge: Family;Friend(s);Available 24 hours/day Type of Home: House Home Access: Stairs to enter [employer] of Steps: 2 Entrance Stairs-Rails: Right;Left Home Layout: Two level Alternate Level Stairs-Number of Steps: Patient has stair lift   Bathroom Shower/Tub: Teacher, early years/pre: Standard     Home Equipment: Grab bars - tub/shower;Shower seat;Cane - single point;Hand held Corporate investment banker - 2 wheels          Prior Functioning/Environment Level of Independence: Independent        Comments: independent until recently; pt required assist for IADLs.        OT Problem List: Decreased strength;Decreased activity tolerance      OT Treatment/Interventions:      OT Goals(Current goals can be found in the care plan section) Acute Rehab OT Goals Patient Stated Goal: feel better OT Goal Formulation: With patient  OT Frequency:     Barriers to D/C:            Co-evaluation              AM-PAC OT "6 Clicks" Daily Activity     Outcome Measure Help from another person eating meals?: None Help from another person taking care of personal grooming?: None Help from another person toileting, which includes using toliet, bedpan, or urinal?: None Help from another person bathing (including washing, rinsing, drying)?: None Help from another person to put on and taking off regular upper body clothing?: None Help from another person to put on and taking off regular lower body clothing?: None 6 Click Score: 24   End of Session Nurse Communication: Mobility status  Activity Tolerance: Patient tolerated treatment well;Patient limited by pain Patient left: in bed;with call bell/phone within reach  OT Visit Diagnosis: Unsteadiness on feet (R26.81);Pain Pain - part of body: (ribs)                Time: [id number] OT Time Calculation (min): 22 min Charges:  OT General Charges $OT Visit: 1 Visit OT Evaluation $OT Eval Moderate Complexity: 1 Mod  Darryl Nestle) Marsa Aris OTR/L Acute Rehabilitation Services Pager: [phone] Office: [place] 07/11/2019, 4:29 PM

## 2019-07-12 ENCOUNTER — Encounter (HOSPITAL_COMMUNITY): Payer: Self-pay | Admitting: *Deleted

## 2019-07-12 ENCOUNTER — Encounter (HOSPITAL_COMMUNITY)
Admission: EM | Disposition: A | Discharge status: Home Health | Payer: Self-pay | Source: Home / Self Care | Attending: Family Medicine

## 2019-07-12 ENCOUNTER — Inpatient Hospital Stay (HOSPITAL_COMMUNITY): Payer: Medicare HMO | Admitting: Anesthesiology

## 2019-07-12 ENCOUNTER — Encounter: Payer: Self-pay | Admitting: Gastroenterology

## 2019-07-12 DIAGNOSIS — K635 Polyp of colon: Secondary | ICD-10-CM

## 2019-07-12 HISTORY — PX: HEMOSTASIS CLIP PLACEMENT: SHX6857

## 2019-07-12 HISTORY — PX: POLYPECTOMY: SHX5525

## 2019-07-12 HISTORY — PX: COLONOSCOPY WITH PROPOFOL: SHX5780

## 2019-07-12 HISTORY — PX: GIVENS CAPSULE STUDY: SHX5432

## 2019-07-12 LAB — PROTIME-INR
INR: 1.1 (ref 0.8–1.2)
Prothrombin Time: 13.9 seconds (ref 11.4–15.2)

## 2019-07-12 LAB — CBC WITH DIFFERENTIAL/PLATELET
Abs Immature Granulocytes: 0.12 10*3/uL — ABNORMAL HIGH (ref 0.00–0.07)
Basophils Absolute: 0 10*3/uL (ref 0.0–0.1)
Basophils Relative: 1 %
Eosinophils Absolute: 0.1 10*3/uL (ref 0.0–0.5)
Eosinophils Relative: 2 %
HCT: 26.4 % — ABNORMAL LOW (ref 36.0–46.0)
Hemoglobin: 8.6 g/dL — ABNORMAL LOW (ref 12.0–15.0)
Immature Granulocytes: 2 %
Lymphocytes Relative: 14 %
Lymphs Abs: 1 10*3/uL (ref 0.7–4.0)
MCH: 29.8 pg (ref 26.0–34.0)
MCHC: 32.6 g/dL (ref 30.0–36.0)
MCV: 91.3 fL (ref 80.0–100.0)
Monocytes Absolute: 0.5 10*3/uL (ref 0.1–1.0)
Monocytes Relative: 7 %
Neutro Abs: 5.4 10*3/uL (ref 1.7–7.7)
Neutrophils Relative %: 74 %
Platelets: 600 10*3/uL — ABNORMAL HIGH (ref 150–400)
RBC: 2.89 MIL/uL — ABNORMAL LOW (ref 3.87–5.11)
RDW: 22.3 % — ABNORMAL HIGH (ref 11.5–15.5)
WBC: 7.2 10*3/uL (ref 4.0–10.5)
nRBC: 0 % (ref 0.0–0.2)

## 2019-07-12 LAB — BASIC METABOLIC PANEL
Anion gap: 11 (ref 5–15)
BUN: 7 mg/dL (ref 6–20)
CO2: 22 mmol/L (ref 22–32)
Calcium: 9.1 mg/dL (ref 8.9–10.3)
Chloride: 103 mmol/L (ref 98–111)
Creatinine, Ser: 1.06 mg/dL — ABNORMAL HIGH (ref 0.44–1.00)
GFR calc Af Amer: >60 mL/min (ref >60)
GFR calc non Af Amer: 58 mL/min — ABNORMAL LOW (ref >60)
Glucose, Bld: 95 mg/dL (ref 70–99)
Potassium: 3.1 mmol/L — ABNORMAL LOW (ref 3.5–5.1)
Sodium: 136 mmol/L (ref 135–145)

## 2019-07-12 SURGERY — COLONOSCOPY WITH PROPOFOL
Anesthesia: Monitor Anesthesia Care

## 2019-07-12 MED ORDER — LACTATED RINGERS IV SOLN
INTRAVENOUS | Status: DC
  Administered 2019-07-12: 10:00:00 via INTRAVENOUS

## 2019-07-12 MED ORDER — POTASSIUM CHLORIDE 10 MEQ/100ML IV SOLN
10.0000 meq | INTRAVENOUS | Status: AC
  Administered 2019-07-12 (×4): 10 meq via INTRAVENOUS
  Filled 2019-07-12 (×5): qty 100

## 2019-07-12 MED ORDER — PROPOFOL 500 MG/50ML IV EMUL
INTRAVENOUS | Status: DC | PRN
  Administered 2019-07-12: 100 ug/kg/min via INTRAVENOUS

## 2019-07-12 MED ORDER — PROPOFOL 10 MG/ML IV BOLUS
INTRAVENOUS | Status: DC | PRN
  Administered 2019-07-12: 20 mg via INTRAVENOUS

## 2019-07-12 MED ORDER — POTASSIUM CHLORIDE CRYS ER 20 MEQ PO TBCR
40.0000 meq | EXTENDED_RELEASE_TABLET | Freq: Once | ORAL | Status: AC
  Administered 2019-07-12: 40 meq via ORAL
  Filled 2019-07-12: qty 2

## 2019-07-12 MED ORDER — LACTATED RINGERS IV SOLN
INTRAVENOUS | Status: DC | PRN
  Administered 2019-07-12: 11:00:00 via INTRAVENOUS

## 2019-07-12 SURGICAL SUPPLY — 21 items

## 2019-07-12 NOTE — Discharge Summary (Addendum)
[facility] Discharge Summary  Patient name: Brianna Porter Medical record number: [id number] Date of birth: 1960-12-25 Age: 58 y.o. Gender: female Date of Admission: 07/09/2019  Date of Discharge: 07/13/2019 Admitting Physician: Leeanne Rio, MD  Primary Care Provider: Cher Nakai, MD Consultants: GI  Indication for Hospitalization: GI bleed  Discharge Diagnoses/Problem List:  Rib fractures GI bleed HFrEF Paroxysmal A. fib Hypothyroidism Anxiety AKI on CKD stage III Hypothyroidism Osteoarthritis  Disposition: To home  Discharge Condition: Stable and improved  Discharge Exam: General: Sleeping comfortably when I enter the room.  Discusses discharge CV: regular rate and rhythm without murmurs, rubs, or gallops Lungs: clear to auscultation bilaterally with normal work of breathing Skin: warm, dry Extremities: warm and well perfused Neuro: Alert and orientedx4, speech normal  Brief Hospital Course:  Patient was admitted for a GI bleed, she was having black tarry stools and had a hemoglobin of 6.0.  She received 1 unit packed red blood cells, subsequent hemoglobin remained above 8.  As patient had recently been admitted for GI bleed earlier this month and had an upper endoscopy, patient had a repeat upper endoscopy, colonoscopy, and small bowel capsule examination during this admission. EGD on 07/11/2019 found non-bleeding gastric ulcers, these were thought not to be the cause of the most recent bleed, and a colonoscopy was performed. Colonoscopy on 07/12/2019 found a diverticulum, two diminutive polyps in the sigmoid colon and in the cecum, which were removed, and a MR conditional clip was placed. To visualize the small bowel, a video capsule endoscopy was performed.  Globin was stable on 11/23.  Patient was seen and cleared by GI.  She will follow-up with them outpatient.  She was discharged.  Issues for Follow Up:  1. Patent presented with AMS  concerning for Xanax intoxication. Per PDMP aware, patient has been using increasing amounts. [place] discharged on lower dose. Please consider limiting usage as this appears to alter patient.  2. GI recommendations  Significant Procedures: EGD, colonoscopy, video capsule endoscopy  Significant Labs and Imaging:  Recent Labs  Lab 07/11/19 0427 07/12/19 0405 07/13/19 0421  WBC 10.8* 7.2 6.2  HGB 8.5* 8.6* 8.1*  HCT 25.9* 26.4* 25.5*  PLT 593* 600* 543*   Recent Labs  Lab 07/09/19 0234 07/09/19 0323  07/09/19 1252 07/10/19 0415 07/11/19 0427 07/12/19 0405 07/13/19 0421  NA 131*  --    < > 135 139 135 136 135  K 2.6*  --    < > 3.8 3.3* 3.8 3.1* 4.3  CL 97*  --    < > 106 105 99 103 104  CO2 20*  --    < > 20* 22 21* 22 24  GLUCOSE 115*  --    < > 99 95 110* 95 124*  BUN 24*  --    < > 15 13 10 7 7   CREATININE 1.61*  --    < > 1.23* 1.18* 1.20* 1.06* 1.21*  CALCIUM 8.2*  --    < > 8.2* 8.8* 9.0 9.1 8.8*  MG  --  1.9  --   --   --   --   --   --   ALKPHOS 84  --   --   --   --   --   --   --   AST 27  --   --   --   --   --   --   --   ALT 36  --   --   --   --   --   --   --  ALBUMIN 2.6*  --   --   --   --   --   --   --    < > = values in this interval not displayed.   Dg Ribs Unilateral Right  Result Date: 07/10/2019 CLINICAL DATA:  [place].  Right lower rib pain. EXAM: RIGHT RIBS - 2 VIEW COMPARISON:  Chest x-ray 06/28/2019 FINDINGS: There are mildly displaced fractures involving the anterior aspects of the right eighth and ninth ribs. No other definite rib fractures are identified. The right lung is clear. No right-sided pneumothorax or pleural effusion. IMPRESSION: Right anterior eighth and ninth rib fractures. No acute pulmonary findings. Electronically Signed   By: Marijo Sanes M.D.   On: 07/10/2019 15:14   Dg Pelvis 1-2 Views  Result Date: 07/10/2019 CLINICAL DATA:  [place]. EXAM: PELVIS - 1-2 VIEW COMPARISON:  None. FINDINGS: Both hips are normally located. No acute  hip fracture. The pubic symphysis and SI joints are intact. No pelvic fractures. IMPRESSION: No acute bony findings. Electronically Signed   By: Marijo Sanes M.D.   On: 07/10/2019 15:17   Ct Head Wo Contrast  Result Date: 07/09/2019 CLINICAL DATA:  Altered level of consciousness, unexplained EXAM: CT HEAD WITHOUT CONTRAST TECHNIQUE: Contiguous axial images were obtained from the base of the skull through the vertex without intravenous contrast. COMPARISON:  05/01/2019 FINDINGS: Brain: No evidence of acute infarction, hemorrhage, hydrocephalus, extra-axial collection or mass lesion/mass effect. Mild white matter disease better seen by interval brain MRI. Normal brain volume. Vascular: No hyperdense vessel or unexpected calcification. Skull: Normal. Negative for fracture or focal lesion. Sinuses/Orbits: No acute finding. Other: Artifact from temperature probe over the forehead. IMPRESSION: No acute or interval finding. Electronically Signed   By: Monte Fantasia M.D.   On: 07/09/2019 04:42   Dg Chest Port 1 View  Result Date: 07/09/2019 CLINICAL DATA:  Shortness of breath EXAM: PORTABLE CHEST 1 VIEW COMPARISON:  Radiograph 06/28/2019 FINDINGS: The cardiomediastinal contours are unchanged with borderline cardiomegaly. The lungs are clear. Pulmonary vasculature is normal. No consolidation, pleural effusion, or pneumothorax. No acute osseous abnormalities are seen. IMPRESSION: No acute findings. Electronically Signed   By: Keith Rake M.D.   On: 07/09/2019 04:21   Dg Chest Portable 1 View  Result Date: 06/28/2019 CLINICAL DATA:  Shortness of breath, CHF EXAM: PORTABLE CHEST 1 VIEW COMPARISON:  None. FINDINGS: Mild cardiomegaly. Both lungs are clear. The visualized skeletal structures are unremarkable. IMPRESSION: Mild cardiomegaly without acute abnormality of the lungs in AP portable projection. Electronically Signed   By: Eddie Candle M.D.   On: 06/28/2019 19:14     Results/Tests Pending at Time  of Discharge:  Discharge Medications:  Allergies as of 07/13/2019      Reactions   Ambien [zolpidem Tartrate]    Pt and family state this med makes the patient sleepwalk.      Medication List    STOP taking these medications   promethazine 25 MG tablet Commonly known as: PHENERGAN   traMADol 50 MG tablet Commonly known as: ULTRAM     TAKE these medications   acetaminophen 325 MG tablet Commonly known as: TYLENOL Take 2 tablets (650 mg total) by mouth every 6 (six) hours as needed for mild pain (or Fever >/= 101).   albuterol 108 (90 Base) MCG/ACT inhaler Commonly known as: VENTOLIN HFA Inhale 2 puffs into the lungs every 4 (four) hours as needed for wheezing or shortness of breath.   ALPRAZolam 0.5 MG tablet Commonly known  as: XANAX Take 0.5-1 mg by mouth See admin instructions. Take 0.5-1 mg by mouth two times a day (midday and bedtime)   amiodarone 200 MG tablet Commonly known as: Pacerone Take 1 tablet (200 mg total) by mouth 2 (two) times daily.   apixaban 5 MG Tabs tablet Commonly known as: ELIQUIS Take 1 tablet (5 mg total) by mouth 2 (two) times daily.   cetirizine 10 MG tablet Commonly known as: ZYRTEC Take 10 mg by mouth daily as needed for allergies.   cholecalciferol 25 MCG (1000 UT) tablet Commonly known as: VITAMIN D3 Take 1,000 Units by mouth daily.   DULoxetine 60 MG capsule Commonly known as: CYMBALTA Take 60 mg by mouth 2 (two) times daily.   fenofibrate 48 MG tablet Commonly known as: TRICOR Take 48 mg by mouth at bedtime.   furosemide 40 MG tablet Commonly known as: LASIX Take 1 tablet (40 mg total) by mouth daily.   ICY HOT ADVANCED RELIEF EX Apply 1 application topically 3 (three) times daily as needed (pain).   methimazole 5 MG tablet Commonly known as: TAPAZOLE Take 5 mg by mouth at bedtime.   metoprolol succinate 100 MG 24 hr tablet Commonly known as: TOPROL-XL Take 1 tablet (100 mg total) by mouth daily. What changed:    how much to take  when to take this   pantoprazole 40 MG tablet Commonly known as: Protonix Take 1 tablet (40 mg total) by mouth 2 (two) times daily.   vitamin C 1000 MG tablet Take 1,000 mg by mouth daily.       Discharge Instructions: Please refer to Patient Instructions section of EMR for full details.  Patient was counseled important signs and symptoms that should prompt return to medical care, changes in medications, dietary instructions, activity restrictions, and follow up appointments.   Follow-Up Appointments: [facility] Follow up.   Specialty: Home Health Services Why: HHPT Contact information: [place] [address] [phone]           Gifford Shave, MD 07/14/2019, 12:16 PM PGY-1, [place] Upper-Level Resident Addendum   I have independently interviewed and examined the patient. I have discussed the above with the original author and agree with their documentation. Please see also any attending notes.    Milus Banister, DO PGY-2, [facility] 07/20/2019 8:52 AM  FPTS Service pager: [phone] (text pages welcome through [facility])

## 2019-07-12 NOTE — Anesthesia Postprocedure Evaluation (Signed)
Anesthesia Post Note  Patient: Brianna Porter  Procedure(s) Performed: COLONOSCOPY WITH PROPOFOL (N/A ) POLYPECTOMY HEMOSTASIS CLIP PLACEMENT GIVENS CAPSULE STUDY (N/A )     Patient location during evaluation: Endoscopy Anesthesia Type: MAC Level of consciousness: awake and alert Pain management: pain level controlled Vital Signs Assessment: post-procedure vital signs reviewed and stable Respiratory status: spontaneous breathing, nonlabored ventilation, respiratory function stable and patient connected to nasal cannula oxygen Cardiovascular status: stable and blood pressure returned to baseline Postop Assessment: no apparent nausea or vomiting Anesthetic complications: no    Last Vitals:  Vitals:   07/12/19 1211 07/12/19 1938  BP: (!) 165/82 113/78  Pulse: 63 84  Resp: 18 20  Temp: (!) 36.3 C 36.4 C  SpO2: 100% 100%    Last Pain:  Vitals:   07/12/19 1938  TempSrc: Oral  PainSc:                  Mykelti Goldenstein

## 2019-07-12 NOTE — Interval H&P Note (Signed)
History and Physical Interval Note:  07/12/2019 10:43 AM  Brianna Porter  has presented today for surgery, with the diagnosis of melena.  The various methods of treatment have been discussed with the patient and family. After consideration of risks, benefits and other options for treatment, the patient has consented to  Procedure(s): COLONOSCOPY WITH PROPOFOL (N/A) as a surgical intervention.  The patient's history has been reviewed, patient examined, no change in status, stable for surgery.  I have reviewed the patient's chart and labs.  Questions were answered to the patient's satisfaction.     [name]

## 2019-07-12 NOTE — Progress Notes (Signed)
Givens capsule ingested at 11:55.  Pt to remain NPO for 2 hours.  Instruction sheet provided to pt.  Pt verbalized understanding.  Report given to bedside RN.

## 2019-07-12 NOTE — Anesthesia Preprocedure Evaluation (Signed)
Anesthesia Evaluation  Patient identified by MRN, date of birth, ID band Patient awake    Reviewed: Allergy & Precautions, NPO status , Patient's Chart, lab work & pertinent test results, reviewed documented beta blocker date and time   History of Anesthesia Complications Negative for: history of anesthetic complications  Airway Mallampati: III  TM Distance: <3 FB Neck ROM: Full   Comment: ANTERIOR Dental  (+) Poor Dentition ANTERIOR:   Pulmonary neg recent URI, former smoker,    breath sounds clear to auscultation       Cardiovascular hypertension, Pt. on medications and Pt. on home beta blockers + CAD and +CHF (EF 20-25%)   Rhythm:Regular Rate:Normal  Echo 04/29/2019  1. The left ventricle has severely reduced systolic function, with an ejection fraction of 20-25%. The cavity size was normal. Left ventrical global hypokinesis without regional wall motion abnormalities.  2. The right ventricle has severely reduced systolic function. The cavity was normal. There is no increase in right ventricular wall thickness. Right ventricular systolic pressure is normal.  3. No evidence of a thrombus present in the left atrial appendage.  4. Mild mitral insufficiency. The jet is centrally-directed.  5. The aortic root, ascending aorta, aortic arch and descending aorta are normal in size and structure.  6. No intracardiac thrombi or masses were visualized.   Neuro/Psych PSYCHIATRIC DISORDERS Anxiety Depression negative neurological ROS     GI/Hepatic Neg liver ROS, PUD, ?GI bleed   Endo/Other  diabetes, Type 2Hyperthyroidism   Renal/GU Renal diseaseCr 1.33 K+ 3.3     Musculoskeletal  (+) Arthritis ,   Abdominal   Peds  Hematology  (+) Blood dyscrasia, anemia , Plt 533 Hgb 10.2   Anesthesia Other Findings   Reproductive/Obstetrics                             Anesthesia Physical Anesthesia Plan  ASA:  III  Anesthesia Plan: MAC   Post-op Pain Management:    Induction: Intravenous  PONV Risk Score and Plan: 2 and Treatment may vary due to age or medical condition and Propofol infusion  Airway Management Planned: Nasal Cannula  Additional Equipment: None  Intra-op Plan:   Post-operative Plan:   Informed Consent: I have reviewed the patients History and Physical, chart, labs and discussed the procedure including the risks, benefits and alternatives for the proposed anesthesia with the patient or authorized representative who has indicated his/her understanding and acceptance.     Dental advisory given  Plan Discussed with: CRNA and Surgeon  Anesthesia Plan Comments:         Anesthesia Quick Evaluation

## 2019-07-12 NOTE — Transfer of Care (Signed)
Immediate Anesthesia Transfer of Care Note  Patient: Brianna Porter  Procedure(s) Performed: COLONOSCOPY WITH PROPOFOL (N/A ) POLYPECTOMY HEMOSTASIS CLIP PLACEMENT  Patient Location: Endoscopy Unit  Anesthesia Type:MAC  Level of Consciousness: awake, alert  and oriented  Airway & Oxygen Therapy: Patient Spontanous Breathing and Patient connected to nasal cannula oxygen  Post-op Assessment: Report given to RN and Post -op Vital signs reviewed and stable  Post vital signs: Reviewed and stable  Last Vitals:  Vitals Value Taken Time  BP    Temp    Pulse    Resp    SpO2      Last Pain:  Vitals:   07/12/19 1000  TempSrc: Temporal  PainSc: 0-No pain      Patients Stated Pain Goal: 5 (98/26/41 5830)  Complications: No apparent anesthesia complications

## 2019-07-12 NOTE — Op Note (Signed)
[facility] Patient Name: Brianna Porter Procedure Date : 07/12/2019 MRN: [id number] Attending MD: Estill Cotta. Loletha Carrow , MD Date of Birth: 10/15/1960 CSN: [id number] Age: 58 Admit Type: Inpatient Procedure:                Colonoscopy Indications:              Melena, Acute post hemorrhagic anemia (no clear                            source on SB enteroscopy) Providers:                Estill Cotta. Loletha Carrow, MD, Josie Dixon, RN, Laverda Sorenson, Technician, Clearnce Sorrel, CRNA Referring MD:             [facility] Medicine Teaching Service Medicines:                Monitored Anesthesia Care Complications:            No immediate complications. Estimated Blood Loss:     Estimated blood loss was minimal. Procedure:                Pre-Anesthesia Assessment:                           - Prior to the procedure, a History and Physical                            was performed, and patient medications and                            allergies were reviewed. The patient's tolerance of                            previous anesthesia was also reviewed. The risks                            and benefits of the procedure and the sedation                            options and risks were discussed with the patient.                            All questions were answered, and informed consent                            was obtained. Prior Anticoagulants: The patient has                            taken Eliquis (apixaban), last dose was 3 days                            prior to procedure. ASA Grade Assessment: III - A  patient with severe systemic disease. After                            reviewing the risks and benefits, the patient was                            deemed in satisfactory condition to undergo the                            procedure.                           After obtaining informed consent, the colonoscope                            was  passed under direct vision. Throughout the                            procedure, the patient's blood pressure, pulse, and                            oxygen saturations were monitored continuously. The                            [id number] Olympus pediatric colonoscope                            was introduced through the anus and advanced to the                            the cecum, identified by appendiceal orifice and                            ileocecal valve. The colonoscopy was performed                            without difficulty. The patient tolerated the                            procedure well. The quality of the bowel                            preparation was excellent. The ileocecal valve,                            appendiceal orifice, and rectum were photographed.                            The bowel preparation used was 2 day using Moviprep                            (patient did not consume all prep first try). Scope In: 10:46:06 AM Scope Out: 11:05:39 AM Scope Withdrawal Time: 0 hours 13 minutes 30 seconds  Total Procedure Duration: 0 hours 19  minutes 33 seconds  Findings:      The digital rectal exam findings include decreased sphincter tone.      Multiple diverticula were found in the left colon.      Two sessile polyps were found in the sigmoid colon and cecum. The polyps       were diminutive in size. These polyps were removed with a cold snare.       Resection and retrieval were complete. To prevent bleeding after the       polypectomy, one hemostatic clip was successfully placed (MR       conditional) on sigmoid polypectomy site. There was no bleeding at the       end of the procedure.      Retroflexion in the rectum was not performed due to anatomy.      The exam was otherwise without abnormality. Impression:               - Decreased sphincter tone found on digital rectal                            exam.                           - Diverticulosis in  the left colon.                           - Two diminutive polyps in the sigmoid colon and in                            the cecum, removed with a cold snare. Resected and                            retrieved. Clip (MR conditional) was placed.                           - The examination was otherwise normal. Recommendation:           - Clear liquid diet.                           - To visualize the small bowel, perform video                            capsule endoscopy today.                           - Await pathology results.                           - Repeat colonoscopy is recommended for                            surveillance. The colonoscopy date will be                            determined after pathology results from today's  exam become available for review. Patient will be                            notifed of this by our office, then results also                            sent to her primary GI physician Dr. Melina Copa in                            [place]. Procedure Code(s):        --- Professional ---                           [phone], Colonoscopy, flexible; with removal of                            tumor(s), polyp(s), or other lesion(s) by snare                            technique Diagnosis Code(s):        --- Professional ---                           K62.89, Other specified diseases of anus and rectum                           K63.5, Polyp of colon                           K92.1, Melena (includes Hematochezia)                           D62, Acute posthemorrhagic anemia                           K57.30, Diverticulosis of large intestine without                            perforation or abscess without bleeding CPT copyright 2019 [employer]. All rights reserved. The codes documented in this report are preliminary and upon coder review may  be revised to meet current compliance requirements. Henry L. Loletha Carrow, MD 07/12/2019  11:34:10 AM This report has been signed electronically. Number of Addenda: 0

## 2019-07-13 ENCOUNTER — Encounter (HOSPITAL_COMMUNITY): Payer: Self-pay | Admitting: Gastroenterology

## 2019-07-13 DIAGNOSIS — K922 Gastrointestinal hemorrhage, unspecified: Secondary | ICD-10-CM

## 2019-07-13 LAB — BASIC METABOLIC PANEL
Anion gap: 7 (ref 5–15)
BUN: 7 mg/dL (ref 6–20)
CO2: 24 mmol/L (ref 22–32)
Calcium: 8.8 mg/dL — ABNORMAL LOW (ref 8.9–10.3)
Chloride: 104 mmol/L (ref 98–111)
Creatinine, Ser: 1.21 mg/dL — ABNORMAL HIGH (ref 0.44–1.00)
GFR calc Af Amer: 57 mL/min — ABNORMAL LOW (ref >60)
GFR calc non Af Amer: 49 mL/min — ABNORMAL LOW (ref >60)
Glucose, Bld: 124 mg/dL — ABNORMAL HIGH (ref 70–99)
Potassium: 4.3 mmol/L (ref 3.5–5.1)
Sodium: 135 mmol/L (ref 135–145)

## 2019-07-13 LAB — CBC
HCT: 25.5 % — ABNORMAL LOW (ref 36.0–46.0)
Hemoglobin: 8.1 g/dL — ABNORMAL LOW (ref 12.0–15.0)
MCH: 29.5 pg (ref 26.0–34.0)
MCHC: 31.8 g/dL (ref 30.0–36.0)
MCV: 92.7 fL (ref 80.0–100.0)
Platelets: 543 10*3/uL — ABNORMAL HIGH (ref 150–400)
RBC: 2.75 MIL/uL — ABNORMAL LOW (ref 3.87–5.11)
RDW: 22.2 % — ABNORMAL HIGH (ref 11.5–15.5)
WBC: 6.2 10*3/uL (ref 4.0–10.5)
nRBC: 0 % (ref 0.0–0.2)

## 2019-07-13 MED ORDER — METOPROLOL SUCCINATE ER 100 MG PO TB24: 100.0000 mg | ORAL_TABLET | Freq: Every day | ORAL | 0 refills | Status: DC

## 2019-07-13 NOTE — Progress Notes (Signed)
Patient d/c home, home meds given back to patient from pharmacy. Tele and iv d/c.

## 2019-07-13 NOTE — Care Management Important Message (Signed)
Important Message  Patient Details  Name: Brianna Porter MRN: [id number] Date of Birth: [birth year]   Medicare Important Message Given:  Yes     Shelda Altes 07/13/2019, 11:55 AM

## 2019-07-13 NOTE — TOC Initial Note (Signed)
Transition of Care [facility]) - Initial/Assessment Note    Patient Details  Name: Brianna Porter MRN: [id number] Date of Birth: 1961-04-30  Transition of Care [facility]) CM/SW Contact:    Zenon Mayo, RN Phone Number: 07/13/2019, 10:07 AM  Clinical Narrative:                 From home with daughter and grand kids, she has a rolling walker at home.  NCM offered choice for HHPT, she chose [facility], referral made to [place] with [facility], she states they can take referral. Soc will be Thursday.  Patient has transportation at dc, has no issues with getting meds.  She states she has a follow up apt with her PCP on Wed already made.  Expected Discharge Plan: [place] Barriers to Discharge: No Barriers Identified   Patient Goals and CMS Choice Patient states their goals for this hospitalization and ongoing recovery are:: get well CMS Medicare.gov Compare Post Acute Care list provided to:: Patient Choice offered to / list presented to : Patient  Expected Discharge Plan and Services Expected Discharge Plan: [place] In-house Referral: NA Discharge Planning Services: CM Consult Post Acute Care Choice: [place] arrangements for the past 2 months: Single Family Home                 DME Arranged: (NA)         HH Arranged: PT HH Agency: [facility] Date [place] Agency Contacted: 07/13/19 Time HH Agency Contacted: [phone] Representative spoke with at [place]: [place] Arrangements/Services Living arrangements for the past 2 months: [place] with:: Adult Children Patient language and need for interpreter reviewed:: Yes Do you feel safe going back to the place where you live?: Yes      Need for Family Participation in Patient Care: Yes (Comment) Care giver support system in place?: Yes (comment) Current home services: DME(has a walker) Criminal Activity/Legal Involvement Pertinent to Current  Situation/Hospitalization: No - Comment as needed  Activities of Daily Living Home Assistive Devices/Equipment: None ADL Screening (condition at time of admission) Patient's cognitive ability adequate to safely complete daily activities?: Yes Is the patient deaf or have difficulty hearing?: No Does the patient have difficulty seeing, even when wearing glasses/contacts?: No Does the patient have difficulty concentrating, remembering, or making decisions?: No Patient able to express need for assistance with ADLs?: Yes Does the patient have difficulty dressing or bathing?: No Independently performs ADLs?: Yes (appropriate for developmental age) Does the patient have difficulty walking or climbing stairs?: No Weakness of Legs: None Weakness of Arms/Hands: None  Permission Sought/Granted                  Emotional Assessment Appearance:: Appears stated age Attitude/Demeanor/Rapport: Engaged Affect (typically observed): Appropriate Orientation: : Oriented to Self, Oriented to Place, Oriented to  Time, Oriented to Situation Alcohol / Substance Use: Not Applicable Psych Involvement: No (comment)  Admission diagnosis:  Shortness of breath [R06.02] Gastrointestinal hemorrhage, unspecified gastrointestinal hemorrhage type [K92.2] Anemia [D64.9] Patient Active Problem List   Diagnosis Date Noted  . Gastrointestinal hemorrhage   . Pressure injury of skin 07/09/2019  . Acute gastric ulcer with hemorrhage   . Symptomatic anemia 06/28/2019  . Elevated liver function tests   . NICM (nonischemic cardiomyopathy) ([place])   . Mild CAD   . Confusion   . Diabetes ([place])   . Iron deficiency anemia   . Hyperthyroidism   .  Altered mental status 05/07/2019  . Acute systolic heart failure ([place]) 05/07/2019  . AKI (acute kidney injury) ([place]) 05/07/2019  . Hyponatremia   . CAD (coronary artery disease)   . Essential hypertension   . Hypokalemia   . Prolonged Q-T interval on ECG 04/30/2019  .  Atrial fibrillation with rapid ventricular response ([place]) 04/29/2019  . Persistent atrial fibrillation ([place])   . Hyperlipidemia 04/23/2019  . Anemia 04/23/2019  . CKD (chronic kidney disease) stage 3, GFR 30-59 ml/min 04/23/2019  . Depression 03/02/2019  . Osteoarthritis 03/02/2019  . Type 2 diabetes mellitus with complication, without long-term current use of insulin ([place]) 03/02/2019  . Hypertensive heart disease 03/02/2019   PCP:  Cher Nakai, MD Pharmacy:   [facility] [address] [place] [address] Phone: [phone] Fax: [phone]  [facility] [place], [place] - [address] [place] [address] Phone: [phone] Fax: [phone]     Social Determinants of Health (SDOH) Interventions    Readmission Risk Interventions Readmission Risk Prevention Plan 07/13/2019  Transportation Screening Complete  PCP or Specialist Appt within 3-5 Days Complete  HRI or [place] Complete  Social Work Consult for [place] Planning/Counseling Complete  Palliative Care Screening Not Applicable  Medication Review Press photographer) Complete

## 2019-07-13 NOTE — Progress Notes (Addendum)
     Progress Note    ASSESSMENT AND PLAN:    44. 58 yo female with recurrent GI bleeding / anemia on Apixiban. Nonbeeding, clean-based, partially healing gastric ulcers  EGD / enteroscopy this admission. Ulcers not felt to be source of melena.  Colonoscopy yesterday remarkable for diverticulosis. Polyps removed. No active bleeding \ -Very slight drift in hgb since transfusion on 11/19 .   -She is begging for discharge home. Ok from GI standpoint to go home. Patient assures me she will call Dr. Carmie End office for hospital follow up on GI bleeding and follow up on polyps.  -Okay to resume Apixiban now -ferritin 191 on 06/28/19. I am not starting oral iron as it may turn stools black and cause confusion whether having active GI bleed   2. Colon polyps, path pending -Polyp surveillance colonoscopy per primary GI in [place].    SUBJECTIVE       OBJECTIVE:     Vital signs in last 24 hours: Temp:  [97.4 F (36.3 C)-98 F (36.7 C)] 98 F (36.7 C) (11/23 0508) Pulse Rate:  [63-84] 78 (11/23 0508) Resp:  [15-20] 18 (11/23 0508) BP: (113-192)/(66-82) 123/69 (11/23 1021) SpO2:  [100 %] 100 % (11/23 0508) Weight:  [57 kg-57.6 kg] 57 kg (11/23 0508) Last BM Date: 07/12/19 General:   Alert, well-developed female in NAD EENT:  Normal hearing, non icteric sclera, conjunctive pink.  Heart:  Regular rate ;  No lower extremity edema   Pulm: Normal respiratory effort, lungs CTA bilaterally without wheezes or crackles. Abdomen:  Soft, nondistended, nontender.  Normal bowel sounds.          Neurologic:  Alert and  oriented x4;  grossly normal neurologically. Psych:  Pleasant, cooperative.  Normal mood and affect.   Intake/Output from previous day: 11/22 0701 - 11/23 0700 In: 639.2 [P.O.:240; I.V.:100; IV Piggyback:299.2] Out: 1200 [Urine:1200] Intake/Output this shift: No intake/output data recorded.  Lab Results: Recent Labs    07/11/19 0427 07/12/19 0405 07/13/19 0421  WBC  10.8* 7.2 6.2  HGB 8.5* 8.6* 8.1*  HCT 25.9* 26.4* 25.5*  PLT 593* 600* 543*   BMET Recent Labs    07/11/19 0427 07/12/19 0405 07/13/19 0421  NA 135 136 135  K 3.8 3.1* 4.3  CL 99 103 104  CO2 21* 22 24  GLUCOSE 110* 95 124*  BUN 10 7 7   CREATININE 1.20* 1.06* 1.21*  CALCIUM 9.0 9.1 8.8*   LFT No results for input(s): PROT, ALBUMIN, AST, ALT, ALKPHOS, BILITOT, BILIDIR, IBILI in the last 72 hours. PT/INR Recent Labs    07/12/19 0405  LABPROT 13.9  INR 1.1     Active Problems:   Anemia   Pressure injury of skin   Gastrointestinal hemorrhage     LOS: 4 days   Tye Savoy ,NP 07/13/2019, 11:13 AM

## 2019-07-13 NOTE — TOC Transition Note (Addendum)
Transition of Care [facility]) - CM/SW Discharge Note   Patient Details  Name: Brianna Porter MRN: [id number] Date of Birth: 04-06-1961  Transition of Care [facility]) CM/SW Contact:  Zenon Mayo, RN Phone Number: 07/13/2019, 10:12 AM   Clinical Narrative:    From home with daughter and grand kids, she has a rolling walker at home.  NCM offered choice for HHPT, she chose [facility], referral made to [place] with [facility], she states they can take referral. Soc will be Thursday.  Patient has transportation at dc, has no issues with getting meds.  She states she has a follow up apt with her PCP on Wed already made. NCM notified MD need HHPT order.    Final next level of care: [place] Barriers to Discharge: No Barriers Identified   Patient Goals and CMS Choice Patient states their goals for this hospitalization and ongoing recovery are:: get well CMS Medicare.gov Compare Post Acute Care list provided to:: Patient Choice offered to / list presented to : Patient  Discharge Placement                       Discharge Plan and Services In-house Referral: NA Discharge Planning Services: CM Consult Post Acute Care Choice: Home Health          DME Arranged: (NA)         HH Arranged: PT HH [facility]: [facility] Date [place]: 07/13/19 Time [place]: [id number] Representative spoke with at [place]: [place] ([place]) Interventions     Readmission Risk Interventions Readmission Risk Prevention Plan 07/13/2019  Transportation Screening Complete  PCP or Specialist Appt within 3-5 Days Complete  HRI or [place] Complete  Social Work Consult for [place] Planning/Counseling Complete  Palliative Care Screening Not Applicable  Medication Review Press photographer) Complete

## 2019-07-13 NOTE — Progress Notes (Signed)
PT Cancellation Note  Patient Details Name: Brianna Porter MRN: [id number] DOB: 04/18/1961   Cancelled Treatment:    Reason Eval/Treat Not Completed: Patient declined, no reason specified. Pt sitting EOB upon PT arrival, reports fatigue and declines PT stating she was just up ambulating in the room and does not need PT at this time in addition to being too tired to mobilize at this time. Pt was educated on importance of PT and continued mobility, but continues to decline at this time. PT will continue to follow as time/schedule allows.   Mickey Farber, PT, DPT   Acute Rehabilitation Department [phone]   Otho Bellows 07/13/2019, 10:42 AM

## 2019-07-13 NOTE — Discharge Instructions (Signed)
Thank you for allowing [place] to participate in your care!    You were admitted for [facility] intestinal bleed.  You were seen by the gastroenterologist and they did a colonoscopy as well as an endoscopy and a pill capsule endoscopy.  You are discharged with close follow-up with gastroenterology.  If you experience worsening of your admission symptoms, develop shortness of breath, life threatening emergency, suicidal or homicidal thoughts you must seek medical attention immediately by calling 911 or calling your MD immediately  if symptoms less severe.   Gastrointestinal Bleeding Gastrointestinal (GI) bleeding is bleeding somewhere along the path that food travels through the body (digestive tract). This path is anywhere between the mouth and the opening of the butt (anus). You may have blood in your poop (stool) or have black poop. If you throw up (vomit), there may be blood in it. This condition can be mild, serious, or even life-threatening. If you have a lot of bleeding, you may need to stay in the hospital. What are the causes? This condition may be caused by:  Irritation and swelling of the esophagus (esophagitis). The esophagus is part of the body that moves food from your mouth to your stomach.  Swollen veins in the butt (hemorrhoids).  Areas of painful tearing in the opening of the butt (anal fissures). These are often caused by passing hard poop.  Pouches that form on the colon over time (diverticulosis).  Irritation and swelling (diverticulitis) in areas where pouches have formed on the colon.  Growths (polyps) or cancer. Colon cancer often starts out as growths that are not cancer.  Irritation of the stomach lining (gastritis).  Sores (ulcers) in the stomach. What increases the risk? You are more likely to develop this condition if you:  Have a certain type of infection in your stomach (Helicobacter pylori infection).  Take certain medicines.  Smoke.  Drink alcohol. What  are the signs or symptoms? Common symptoms of this condition include:  Throwing up (vomiting) material that has bright red blood in it. It may look like coffee grounds.  Changes in your poop. The poop may: ? Have red blood in it. ? Be black, look like tar, and smell stronger than normal. ? Be red.  Pain or cramping in the belly (abdomen). How is this treated? Treatment for this condition depends on the cause of the bleeding. For example:  Sometimes, the bleeding can be stopped during a procedure that is done to find the problem (endoscopy or colonoscopy).  Medicines can be used to: ? Help control irritation, swelling, or infection. ? Reduce acid in your stomach.  Certain problems can be treated with: ? Creams. ? Medicines that are put in the butt (suppositories). ? Warm baths.  Surgery is sometimes needed.  If you lose a lot of blood, you may need a blood transfusion. If bleeding is mild, you may be allowed to go home. If there is a lot of bleeding, you will need to stay in the hospital. Follow these instructions at home:   Take over-the-counter and prescription medicines only as told by your doctor.  Eat foods that have a lot of fiber in them. These foods include beans, whole grains, and fresh fruits and vegetables. You can also try eating 1-3 prunes each day.  Drink enough fluid to keep your pee (urine) pale yellow.  Keep all follow-up visits as told by your doctor. This is important. Contact a doctor if:  Your symptoms do not get better. Get help right away  if:  Your bleeding does not stop.  You feel dizzy or you pass out (faint).  You feel weak.  You have very bad cramps in your back or belly.  You pass large clumps of blood (clots) in your poop.  Your symptoms are getting worse.  You have chest pain or fast heartbeats. Summary  GI bleeding is bleeding somewhere along the path that food travels through the body (digestive tract).  This bleeding can be  caused by many things. Treatment depends on the cause of the bleeding.  Take medicines only as told by your doctor.  Keep all follow-up visits as told by your doctor. This is important. This information is not intended to replace advice given to you by your health care provider. Make sure you discuss any questions you have with your health care provider. Document Released: 05/15/2008 Document Revised: 03/19/2018 Document Reviewed: 03/19/2018 Elsevier Patient Education  2020 [employer].

## 2019-07-13 NOTE — Progress Notes (Signed)
Family Medicine Teaching Service Daily Progress Note Intern Pager: [phone]  Patient name: Brianna Porter Medical record number: [id number] Date of birth: Oct 21, 1960 Age: 58 y.o. Gender: female  Primary Care Provider: Cher Nakai, MD Consultants: GI Code Status: Full  Pt Overview and Major Events to Date:  07/09/2019: admitted, hgb 6 > 1U pRBCs > hgb 8.8 07/10/2019: EGD - few nonbleeding superficial gastric ulcers, partially healed with no stigmata of recent bleeding, likely not source of bleeding; R rib XR: R ant 8th-9th rib fractures; Pelvic XR: neg 07/12/2019: Colonoscopy  Assessment and Plan: Brianna Porter is a 58 y.o. female  presenting after a fall and dark stools at home. PMH is significant for history of GI bleed, HFrEF, paroxysmal A FIB, hyperthyroidism, anxiety  Symptomatic anemia, improved  hx of GI bleed  Hgb stable at 8.5>8.6>8.1 this AM. S/p EGD on 11/20 and colonoscopy 11/22.  No source of bleeding found on colonoscopy.  Pill endoscopy done 11/22. - GI following, appreciate recs - advance diet per GI - Hold Eliquis - avoid NSAIDS - IV protonix BID - daily CBC - PT/OT: HH PT, rolling walker, no OT f/u - SCDs  ParoxysmalA. Fibwith H/O Cardioversion Had elevated HR to 150's over the course of yesterday with tele revealing A-fib with RVR. Home medications include Eliquis, metoprolol, amiodarone. Home Metoprolol initially held due to NPO/hypotension on admission. Home Metoprolol 50mg  BID restarted + 5mg  IV x 1 with improvement in heart rate. HR 60s-80s overnight. BP elevated overnight with blood pressures ranging from 170s/70s-190s/70s. - continue to hold Eliquis for acute GI bleed - continue Amiodarone 200mg  BD, hold if MAP<60 - Continue home Metoprolol 50mg  BID, hold if MAP<60 - continue to monitor BP  Rib Fractures  Fall  Right 8th and 9th rib fracture noted on x-ray. Very minimal bruising on exam. Pain well controlled with home Tramadol. No incentive  spirometry in room. Nurse notified. - Continue home tramadol for pain - Albuterol inhaler every 4 hours as needed - Incentive spirometry every 2 hours - Continue to monitor respiratory status  AMS- resolved Continues to remain alert and orientedx4. Etiology of altered mental status likely due to Xanax intoxication.  PMP review, patient has been getting increasingly more Xanax.  -Decrease Xanax on discharge, would likely benefit from coming off completely -PT/OT eval and treat  Hypokalemia K 3.1 this AM. - 59mEq IV K x4 this AM given NPO + 31mEq Kdur this evening - Replete as needed  - Daily BMP  Anxiety  Home medications include Xanax 0.5 mg twice daily and Cymbalta 60 mg twice daily. PMP review, patient has been getting increasingly more Xanax.  -Restart cymbalta as AKI resolved -Continued home dosing of xanax  AKIon CKD stage III- resolved Cr 1.61 on admission, 1.06 this AM.New baseline may be >1 with previous baseline ~0.9.  - Daily BMP - Encourage oral hydration - continue to monitor given initiation of home Lasix  HFrEF, EF 20-25% Appears euvolemic on exam. 0.48mL UOP in the last 24 hours. Home meds: Lasix 40mg  QD, Metoprolol 50mg  BID.  - continue home meds - daily weights, strict I/O's - daily BMP  Hyperthyroidism Home meds: Methimazole 5mg  qHS. - continue home meds  Osteoarthritis Home meds include: Tylenol, Ibuprofen 400mg  QD, Tramadol 50mg  QID PRN.  - continue Tylenol 650mg  q6h PRN - Continue Tramadol 50mg  q12H PRN for pain - Avoid NSAIDs  FEN/GI: Replete electrolytes as needed,  advance diet per GI Prophylaxis: SCDs  Disposition: Likely Home with home health today pending  further GI recommendations  Subjective:  Brianna Porter is doing well this morning.  Denies any complaints or concerns.  Patient had pill endoscopy last night with no results at this time.  Objective: Temp:  [97.4 F (36.3 C)-98 F (36.7 C)] 98 F (36.7 C) (11/23 0508) Pulse  Rate:  [63-84] 78 (11/23 0508) Resp:  [15-20] 18 (11/23 0508) BP: (113-192)/(66-82) 123/69 (11/23 1021) SpO2:  [100 %] 100 % (11/23 0508) Weight:  [57 kg-57.6 kg] 57 kg (11/23 0508) Physical Exam: General: Sleeping comfortably when I enter the room.  Discusses discharge CV: regular rate and rhythm without murmurs, rubs, or gallops Lungs: clear to auscultation bilaterally with normal work of breathing Skin: warm, dry Extremities: warm and well perfused Neuro: Alert and orientedx4, speech normal  Laboratory: Recent Labs  Lab 07/11/19 0427 07/12/19 0405 07/13/19 0421  WBC 10.8* 7.2 6.2  HGB 8.5* 8.6* 8.1*  HCT 25.9* 26.4* 25.5*  PLT 593* 600* 543*   Recent Labs  Lab 07/09/19 0234  07/11/19 0427 07/12/19 0405 07/13/19 0421  NA 131*   < > 135 136 135  K 2.6*   < > 3.8 3.1* 4.3  CL 97*   < > 99 103 104  CO2 20*   < > 21* 22 24  BUN 24*   < > 10 7 7   CREATININE 1.61*   < > 1.20* 1.06* 1.21*  CALCIUM 8.2*   < > 9.0 9.1 8.8*  PROT 5.0*  --   --   --   --   BILITOT 0.9  --   --   --   --   ALKPHOS 84  --   --   --   --   ALT 36  --   --   --   --   AST 27  --   --   --   --   GLUCOSE 115*   < > 110* 95 124*   < > = values in this interval not displayed.    Imaging/Diagnostic Tests: No results found.    Gifford Shave, MD 07/13/2019, 11:18 AM  PGY-1, [place] Intern pager: [phone], text pages welcome

## 2019-07-14 ENCOUNTER — Ambulatory Visit: Payer: Medicare HMO | Admitting: Cardiology

## 2019-07-14 NOTE — Progress Notes (Deleted)
Cardiology Office Note:    Date:  07/14/2019   ID:  Brianna Porter, DOB October 31, 1960, MRN [id number]  PCP:  Cher Nakai, MD  Cardiologist:  Shirlee More, MD    Referring MD: Cher Nakai, MD    ASSESSMENT:    No diagnosis found. PLAN:    In order of problems listed above:  1. ***   Next appointment: ***   Medication Adjustments/Labs and Tests Ordered: Current medicines are reviewed at length with the patient today.  Concerns regarding medicines are outlined above.  No orders of the defined types were placed in this encounter.  No orders of the defined types were placed in this encounter.   No chief complaint on file.   History of Present Illness:    Brianna Porter is a 58 y.o. female with a hx of atrial flutter, hypertensive heart disease and heart failure iron deficiency anemia and CKD.  Last seen 04/23/2019.  She was converted to sinus rhythm when she was seen in follow-up by Dr. Jorene Minors 05/19/2019.  Recurrent atrial fibrillation is on amiodarone and was seen in consultation EP Dr. Curt Bears 06/01/2019 back in atrial fibrillation.  Transesophageal echocardiogram 04/29/2019 showed severe cardiomyopathy EF 20 to 25%.  Subsequently has developed anemia and hospitalization with GI bleed Compliance with diet, lifestyle and medications: ***   Ref Range & Units 1d ago 2d ago 3d ago 4d ago  WBC 4.0 - 10.5 K/uL 6.2  7.2  10.8High   8.2   RBC 3.87 - 5.11 MIL/uL 2.75Low   2.89Low   2.86Low   2.81Low    Hemoglobin 12.0 - 15.0 g/dL 8.1Low   8.6Low   8.5Low   8.3Low    HCT 36.0 - 46.0 % 25.5Low   26.4Low   25.9Low   25.2Low     Past Medical History:  Diagnosis Date  . Anxiety and depression   . Atrial flutter ([place])   . Chronic systolic CHF (congestive heart failure) (HCC)    a. dx in setting of atrial fib/flutter - possibly tachy mediated. Coronary CTA with only mild CAD in 04/2019.  Marland Kitchen CKD (chronic kidney disease), stage II   . Confusion    a. persistent confusion during 04/2019  admission of unclear cause. Home meds adjusted. CT/MRI brain nonacute.  . Diabetes ([place])   . Edema   . Elevated liver function tests   . Hypercholesteremia   . Hyperkalemia   . Hypertension   . Hypertensive heart and chronic kidney disease with systolic congestive heart failure ([place])   . Hyperthyroidism   . Hypokalemia   . Hypokalemia   . Hypomagnesemia   . Hyponatremia   . Iron deficiency anemia   . Mild CAD    a. Coronary CT 04/2019 - Minimal, Non-obstructive CAD.  Marland Kitchen NICM (nonischemic cardiomyopathy) ([place])   . Persistent atrial fibrillation ([place])   . Prolonged QT interval     Past Surgical History:  Procedure Laterality Date  . BIOPSY  06/29/2019   Procedure: BIOPSY;  Surgeon: Irene Shipper, MD;  Location: [place];  Service: Endoscopy;;  . CARDIOVERSION N/A 04/29/2019   Procedure: CARDIOVERSION;  Surgeon: Sanda Klein, MD;  Location: [place];  Service: Cardiovascular;  Laterality: N/A;  . CARDIOVERSION N/A 05/04/2019   Procedure: CARDIOVERSION;  Surgeon: Josue Hector, MD;  Location: [place];  Service: Cardiovascular;  Laterality: N/A;  . COLONOSCOPY WITH PROPOFOL N/A 07/12/2019   Procedure: COLONOSCOPY WITH PROPOFOL;  Surgeon: Doran Stabler, MD;  Location: [place];  Service:  Gastroenterology;  Laterality: N/A;  . ENTEROSCOPY N/A 07/10/2019   Procedure: ENTEROSCOPY;  Surgeon: Doran Stabler, MD;  Location: [place];  Service: Gastroenterology;  Laterality: N/A;  . ESOPHAGOGASTRODUODENOSCOPY  06/29/2019  . ESOPHAGOGASTRODUODENOSCOPY (EGD) WITH PROPOFOL N/A 06/29/2019   Procedure: ESOPHAGOGASTRODUODENOSCOPY (EGD) WITH PROPOFOL;  Surgeon: Irene Shipper, MD;  Location: [facility] ENDOSCOPY;  Service: Endoscopy;  Laterality: N/A;  . GIVENS CAPSULE STUDY N/A 07/12/2019   Procedure: GIVENS CAPSULE STUDY;  Surgeon: Doran Stabler, MD;  Location: [place];  Service: Gastroenterology;  Laterality: N/A;  . HEMOSTASIS CLIP PLACEMENT  07/12/2019    Procedure: HEMOSTASIS CLIP PLACEMENT;  Surgeon: Doran Stabler, MD;  Location: [place];  Service: Gastroenterology;;  . POLYPECTOMY  07/12/2019   Procedure: POLYPECTOMY;  Surgeon: Doran Stabler, MD;  Location: [place];  Service: Gastroenterology;;  . SMALL BOWEL ENTEROSCOPY  07/10/2019  . SUBMUCOSAL TATTOO INJECTION  07/10/2019   Procedure: SUBMUCOSAL TATTOO INJECTION;  Surgeon: Doran Stabler, MD;  Location: [facility] ENDOSCOPY;  Service: Gastroenterology;;  . TEE WITHOUT CARDIOVERSION  04/29/2019  . TEE WITHOUT CARDIOVERSION N/A 04/29/2019   Procedure: TRANSESOPHAGEAL ECHOCARDIOGRAM (TEE);  Surgeon: Sanda Klein, MD;  Location: [facility] ENDOSCOPY;  Service: Cardiovascular;  Laterality: N/A;  . TUBAL LIGATION      Current Medications: No outpatient medications have been marked as taking for the 07/14/19 encounter (Appointment) with Richardo Priest, MD.     Allergies:   Ambien [zolpidem tartrate]   Social History   Socioeconomic History  . Marital status: Divorced    Spouse name: Not on file  . Number of children: Not on file  . Years of education: Not on file  . Highest education level: Not on file  Occupational History  . Not on file  Social Needs  . Financial resource strain: Not on file  . Food insecurity    Worry: Not on file    Inability: Not on file  . Transportation needs    Medical: Not on file    Non-medical: Not on file  Tobacco Use  . Smoking status: Former Smoker    Packs/day: 2.00    Years: 30.00    Pack years: 60.00    Types: Cigarettes    Quit date: 2001    Years since quitting: 19.9  . Smokeless tobacco: Never Used  Substance and Sexual Activity  . Alcohol use: Never    Frequency: Never  . Drug use: Never  . Sexual activity: Not on file  Lifestyle  . Physical activity    Days per week: Not on file    Minutes per session: Not on file  . Stress: Not on file  Relationships  . Social Herbalist on phone: Not on file    Gets  together: Not on file    Attends religious service: Not on file    Active member of club or organization: Not on file    Attends meetings of clubs or organizations: Not on file    Relationship status: Not on file  Other Topics Concern  . Not on file  Social History Narrative  . Not on file     Family History: The patient's ***family history includes Cancer in her mother; Heart disease in her father and mother; Hypercholesterolemia in her brother and mother; Osteoarthritis in her mother; Stroke in her mother. ROS:   Please see the history of present illness.    All other systems reviewed and are negative.  EKGs/Labs/Other Studies Reviewed:    The following studies were reviewed today:  EKG:  EKG ordered today and personally reviewed.  The ekg ordered today demonstrates ***  Recent Labs: 04/30/2019: TSH 3.246 06/12/2019: NT-Pro BNP 12,215 07/09/2019: ALT 36; B Natriuretic Peptide 144.1; Magnesium 1.9 07/13/2019: BUN 7; Creatinine, Ser 1.21; Hemoglobin 8.1; Platelets 543; Potassium 4.3; Sodium 135  Recent Lipid Panel No results found for: CHOL, TRIG, HDL, CHOLHDL, VLDL, LDLCALC, LDLDIRECT  Physical Exam:    VS:  LMP  (LMP Unknown) Comment: ?10 years ago?    Wt Readings from Last 3 Encounters:  07/13/19 125 lb 11.2 oz (57 kg)  06/30/19 136 lb 11.2 oz (62 kg)  06/12/19 130 lb (59 kg)     GEN: *** Well nourished, well developed in no acute distress HEENT: Normal NECK: No JVD; No carotid bruits LYMPHATICS: No lymphadenopathy CARDIAC: ***RRR, no murmurs, rubs, gallops RESPIRATORY:  Clear to auscultation without rales, wheezing or rhonchi  ABDOMEN: Soft, non-tender, non-distended MUSCULOSKELETAL:  No edema; No deformity  SKIN: Warm and dry NEUROLOGIC:  Alert and oriented x 3 PSYCHIATRIC:  Normal affect    Signed, Shirlee More, MD  07/14/2019 8:00 AM    [place]

## 2019-07-15 DIAGNOSIS — J309 Allergic rhinitis, unspecified: Secondary | ICD-10-CM | POA: Diagnosis not present

## 2019-07-15 DIAGNOSIS — K279 Peptic ulcer, site unspecified, unspecified as acute or chronic, without hemorrhage or perforation: Secondary | ICD-10-CM | POA: Diagnosis not present

## 2019-07-15 DIAGNOSIS — I482 Chronic atrial fibrillation, unspecified: Secondary | ICD-10-CM | POA: Diagnosis not present

## 2019-07-15 DIAGNOSIS — K922 Gastrointestinal hemorrhage, unspecified: Secondary | ICD-10-CM | POA: Diagnosis not present

## 2019-07-15 DIAGNOSIS — R7989 Other specified abnormal findings of blood chemistry: Secondary | ICD-10-CM | POA: Diagnosis not present

## 2019-07-15 DIAGNOSIS — R609 Edema, unspecified: Secondary | ICD-10-CM | POA: Diagnosis not present

## 2019-07-15 DIAGNOSIS — S2249XA Multiple fractures of ribs, unspecified side, initial encounter for closed fracture: Secondary | ICD-10-CM | POA: Diagnosis not present

## 2019-07-15 DIAGNOSIS — R0600 Dyspnea, unspecified: Secondary | ICD-10-CM | POA: Diagnosis not present

## 2019-07-15 DIAGNOSIS — R809 Proteinuria, unspecified: Secondary | ICD-10-CM | POA: Diagnosis not present

## 2019-07-15 DIAGNOSIS — E119 Type 2 diabetes mellitus without complications: Secondary | ICD-10-CM | POA: Diagnosis not present

## 2019-07-15 LAB — SURGICAL PATHOLOGY

## 2019-07-21 ENCOUNTER — Encounter: Payer: Self-pay | Admitting: Gastroenterology

## 2019-07-21 DIAGNOSIS — M25551 Pain in right hip: Secondary | ICD-10-CM | POA: Diagnosis not present

## 2019-07-21 DIAGNOSIS — R9431 Abnormal electrocardiogram [ECG] [EKG]: Secondary | ICD-10-CM | POA: Diagnosis not present

## 2019-07-21 DIAGNOSIS — E119 Type 2 diabetes mellitus without complications: Secondary | ICD-10-CM | POA: Diagnosis not present

## 2019-07-21 DIAGNOSIS — K922 Gastrointestinal hemorrhage, unspecified: Secondary | ICD-10-CM | POA: Diagnosis not present

## 2019-07-21 DIAGNOSIS — R809 Proteinuria, unspecified: Secondary | ICD-10-CM | POA: Diagnosis not present

## 2019-07-21 DIAGNOSIS — S2249XA Multiple fractures of ribs, unspecified side, initial encounter for closed fracture: Secondary | ICD-10-CM | POA: Diagnosis not present

## 2019-07-21 DIAGNOSIS — I10 Essential (primary) hypertension: Secondary | ICD-10-CM | POA: Diagnosis not present

## 2019-07-21 DIAGNOSIS — D509 Iron deficiency anemia, unspecified: Secondary | ICD-10-CM | POA: Diagnosis not present

## 2019-07-21 DIAGNOSIS — I482 Chronic atrial fibrillation, unspecified: Secondary | ICD-10-CM | POA: Diagnosis not present

## 2019-07-21 DIAGNOSIS — K279 Peptic ulcer, site unspecified, unspecified as acute or chronic, without hemorrhage or perforation: Secondary | ICD-10-CM | POA: Diagnosis not present

## 2019-07-21 DIAGNOSIS — R0600 Dyspnea, unspecified: Secondary | ICD-10-CM | POA: Diagnosis not present

## 2019-07-22 DIAGNOSIS — K922 Gastrointestinal hemorrhage, unspecified: Secondary | ICD-10-CM | POA: Diagnosis not present

## 2019-07-28 DIAGNOSIS — K279 Peptic ulcer, site unspecified, unspecified as acute or chronic, without hemorrhage or perforation: Secondary | ICD-10-CM | POA: Diagnosis not present

## 2019-07-28 DIAGNOSIS — R0600 Dyspnea, unspecified: Secondary | ICD-10-CM | POA: Diagnosis not present

## 2019-07-28 DIAGNOSIS — S2249XA Multiple fractures of ribs, unspecified side, initial encounter for closed fracture: Secondary | ICD-10-CM | POA: Diagnosis not present

## 2019-07-28 DIAGNOSIS — K922 Gastrointestinal hemorrhage, unspecified: Secondary | ICD-10-CM | POA: Diagnosis not present

## 2019-07-28 DIAGNOSIS — R809 Proteinuria, unspecified: Secondary | ICD-10-CM | POA: Diagnosis not present

## 2019-07-28 DIAGNOSIS — E119 Type 2 diabetes mellitus without complications: Secondary | ICD-10-CM | POA: Diagnosis not present

## 2019-07-28 DIAGNOSIS — D509 Iron deficiency anemia, unspecified: Secondary | ICD-10-CM | POA: Diagnosis not present

## 2019-07-28 DIAGNOSIS — R9431 Abnormal electrocardiogram [ECG] [EKG]: Secondary | ICD-10-CM | POA: Diagnosis not present

## 2019-07-28 DIAGNOSIS — I482 Chronic atrial fibrillation, unspecified: Secondary | ICD-10-CM | POA: Diagnosis not present

## 2019-07-28 DIAGNOSIS — M25512 Pain in left shoulder: Secondary | ICD-10-CM | POA: Diagnosis not present

## 2019-07-28 DIAGNOSIS — I10 Essential (primary) hypertension: Secondary | ICD-10-CM | POA: Diagnosis not present

## 2019-08-04 DIAGNOSIS — R9431 Abnormal electrocardiogram [ECG] [EKG]: Secondary | ICD-10-CM | POA: Diagnosis not present

## 2019-08-04 DIAGNOSIS — E119 Type 2 diabetes mellitus without complications: Secondary | ICD-10-CM | POA: Diagnosis not present

## 2019-08-04 DIAGNOSIS — I10 Essential (primary) hypertension: Secondary | ICD-10-CM | POA: Diagnosis not present

## 2019-08-04 DIAGNOSIS — K279 Peptic ulcer, site unspecified, unspecified as acute or chronic, without hemorrhage or perforation: Secondary | ICD-10-CM | POA: Diagnosis not present

## 2019-08-04 DIAGNOSIS — S2249XA Multiple fractures of ribs, unspecified side, initial encounter for closed fracture: Secondary | ICD-10-CM | POA: Diagnosis not present

## 2019-08-04 DIAGNOSIS — J208 Acute bronchitis due to other specified organisms: Secondary | ICD-10-CM | POA: Diagnosis not present

## 2019-08-04 DIAGNOSIS — I482 Chronic atrial fibrillation, unspecified: Secondary | ICD-10-CM | POA: Diagnosis not present

## 2019-08-04 DIAGNOSIS — R0600 Dyspnea, unspecified: Secondary | ICD-10-CM | POA: Diagnosis not present

## 2019-08-04 DIAGNOSIS — K922 Gastrointestinal hemorrhage, unspecified: Secondary | ICD-10-CM | POA: Diagnosis not present

## 2019-08-04 DIAGNOSIS — D509 Iron deficiency anemia, unspecified: Secondary | ICD-10-CM | POA: Diagnosis not present

## 2019-08-04 DIAGNOSIS — R809 Proteinuria, unspecified: Secondary | ICD-10-CM | POA: Diagnosis not present

## 2019-08-07 DIAGNOSIS — R809 Proteinuria, unspecified: Secondary | ICD-10-CM | POA: Diagnosis not present

## 2019-08-07 DIAGNOSIS — I4892 Unspecified atrial flutter: Secondary | ICD-10-CM | POA: Diagnosis not present

## 2019-08-07 DIAGNOSIS — M25552 Pain in left hip: Secondary | ICD-10-CM | POA: Diagnosis not present

## 2019-08-07 DIAGNOSIS — R609 Edema, unspecified: Secondary | ICD-10-CM | POA: Diagnosis not present

## 2019-08-07 DIAGNOSIS — J309 Allergic rhinitis, unspecified: Secondary | ICD-10-CM | POA: Diagnosis not present

## 2019-08-07 DIAGNOSIS — I10 Essential (primary) hypertension: Secondary | ICD-10-CM | POA: Diagnosis not present

## 2019-08-07 DIAGNOSIS — E7800 Pure hypercholesterolemia, unspecified: Secondary | ICD-10-CM | POA: Diagnosis not present

## 2019-08-07 DIAGNOSIS — E0590 Thyrotoxicosis, unspecified without thyrotoxic crisis or storm: Secondary | ICD-10-CM | POA: Diagnosis not present

## 2019-08-07 DIAGNOSIS — R7989 Other specified abnormal findings of blood chemistry: Secondary | ICD-10-CM | POA: Diagnosis not present

## 2019-08-07 DIAGNOSIS — I482 Chronic atrial fibrillation, unspecified: Secondary | ICD-10-CM | POA: Diagnosis not present

## 2019-08-18 DIAGNOSIS — I4892 Unspecified atrial flutter: Secondary | ICD-10-CM | POA: Diagnosis not present

## 2019-08-18 DIAGNOSIS — R609 Edema, unspecified: Secondary | ICD-10-CM | POA: Diagnosis not present

## 2019-08-18 DIAGNOSIS — R7989 Other specified abnormal findings of blood chemistry: Secondary | ICD-10-CM | POA: Diagnosis not present

## 2019-08-18 DIAGNOSIS — E0590 Thyrotoxicosis, unspecified without thyrotoxic crisis or storm: Secondary | ICD-10-CM | POA: Diagnosis not present

## 2019-08-18 DIAGNOSIS — J309 Allergic rhinitis, unspecified: Secondary | ICD-10-CM | POA: Diagnosis not present

## 2019-08-18 DIAGNOSIS — R809 Proteinuria, unspecified: Secondary | ICD-10-CM | POA: Diagnosis not present

## 2019-08-18 DIAGNOSIS — E7800 Pure hypercholesterolemia, unspecified: Secondary | ICD-10-CM | POA: Diagnosis not present

## 2019-08-18 DIAGNOSIS — N1831 Chronic kidney disease, stage 3a: Secondary | ICD-10-CM | POA: Diagnosis not present

## 2019-08-18 DIAGNOSIS — I482 Chronic atrial fibrillation, unspecified: Secondary | ICD-10-CM | POA: Diagnosis not present

## 2019-08-18 DIAGNOSIS — I10 Essential (primary) hypertension: Secondary | ICD-10-CM | POA: Diagnosis not present

## 2019-08-25 DIAGNOSIS — J309 Allergic rhinitis, unspecified: Secondary | ICD-10-CM | POA: Diagnosis not present

## 2019-08-25 DIAGNOSIS — D509 Iron deficiency anemia, unspecified: Secondary | ICD-10-CM | POA: Diagnosis not present

## 2019-08-25 DIAGNOSIS — E7800 Pure hypercholesterolemia, unspecified: Secondary | ICD-10-CM | POA: Diagnosis not present

## 2019-08-25 DIAGNOSIS — R7989 Other specified abnormal findings of blood chemistry: Secondary | ICD-10-CM | POA: Diagnosis not present

## 2019-08-25 DIAGNOSIS — N1831 Chronic kidney disease, stage 3a: Secondary | ICD-10-CM | POA: Diagnosis not present

## 2019-08-25 DIAGNOSIS — E119 Type 2 diabetes mellitus without complications: Secondary | ICD-10-CM | POA: Diagnosis not present

## 2019-08-25 DIAGNOSIS — I4892 Unspecified atrial flutter: Secondary | ICD-10-CM | POA: Diagnosis not present

## 2019-08-25 DIAGNOSIS — I10 Essential (primary) hypertension: Secondary | ICD-10-CM | POA: Diagnosis not present

## 2019-08-25 DIAGNOSIS — E0590 Thyrotoxicosis, unspecified without thyrotoxic crisis or storm: Secondary | ICD-10-CM | POA: Diagnosis not present

## 2019-08-25 DIAGNOSIS — I482 Chronic atrial fibrillation, unspecified: Secondary | ICD-10-CM | POA: Diagnosis not present

## 2019-08-25 DIAGNOSIS — R809 Proteinuria, unspecified: Secondary | ICD-10-CM | POA: Diagnosis not present

## 2019-08-26 NOTE — Progress Notes (Deleted)
Cardiology Office Note:    Date:  08/26/2019   ID:  Brianna Porter, DOB 07/04/1961, MRN [id number]  PCP:  Cher Nakai, MD  Cardiologist:  Shirlee More, MD    Referring MD: Cher Nakai, MD    ASSESSMENT:    No diagnosis found. PLAN:    In order of problems listed above:  1. ***   Next appointment: ***   Medication Adjustments/Labs and Tests Ordered: Current medicines are reviewed at length with the patient today.  Concerns regarding medicines are outlined above.  No orders of the defined types were placed in this encounter.  No orders of the defined types were placed in this encounter.   No chief complaint on file.   History of Present Illness:    Brianna Porter is a 59 y.o. female with a hx of atrial flutter cardioverted on amiodarone cardiomyopathy EF 20-25% mild CAD hyperthyroidism Stage 3 CKD hypertension and heart failure last seen 04/23/2019.Most recently has had a GI bleed and rib fractures. Compliance with diet, lifestyle and medications: *** Past Medical History:  Diagnosis Date  . Anxiety and depression   . Atrial flutter ([place])   . Chronic systolic CHF (congestive heart failure) (HCC)    a. dx in setting of atrial fib/flutter - possibly tachy mediated. Coronary CTA with only mild CAD in 04/2019.  Marland Kitchen CKD (chronic kidney disease), stage II   . Confusion    a. persistent confusion during 04/2019 admission of unclear cause. Home meds adjusted. CT/MRI brain nonacute.  . Diabetes ([place])   . Edema   . Elevated liver function tests   . Hypercholesteremia   . Hyperkalemia   . Hypertension   . Hypertensive heart and chronic kidney disease with systolic congestive heart failure ([place])   . Hyperthyroidism   . Hypokalemia   . Hypokalemia   . Hypomagnesemia   . Hyponatremia   . Iron deficiency anemia   . Mild CAD    a. Coronary CT 04/2019 - Minimal, Non-obstructive CAD.  Marland Kitchen NICM (nonischemic cardiomyopathy) ([place])   . Persistent atrial fibrillation ([place])   .  Prolonged QT interval     Past Surgical History:  Procedure Laterality Date  . BIOPSY  06/29/2019   Procedure: BIOPSY;  Surgeon: Irene Shipper, MD;  Location: [place];  Service: Endoscopy;;  . CARDIOVERSION N/A 04/29/2019   Procedure: CARDIOVERSION;  Surgeon: Sanda Klein, MD;  Location: [place];  Service: Cardiovascular;  Laterality: N/A;  . CARDIOVERSION N/A 05/04/2019   Procedure: CARDIOVERSION;  Surgeon: Josue Hector, MD;  Location: [place];  Service: Cardiovascular;  Laterality: N/A;  . COLONOSCOPY WITH PROPOFOL N/A 07/12/2019   Procedure: COLONOSCOPY WITH PROPOFOL;  Surgeon: Doran Stabler, MD;  Location: [place];  Service: Gastroenterology;  Laterality: N/A;  . ENTEROSCOPY N/A 07/10/2019   Procedure: ENTEROSCOPY;  Surgeon: Doran Stabler, MD;  Location: [place];  Service: Gastroenterology;  Laterality: N/A;  . ESOPHAGOGASTRODUODENOSCOPY  06/29/2019  . ESOPHAGOGASTRODUODENOSCOPY (EGD) WITH PROPOFOL N/A 06/29/2019   Procedure: ESOPHAGOGASTRODUODENOSCOPY (EGD) WITH PROPOFOL;  Surgeon: Irene Shipper, MD;  Location: [facility] ENDOSCOPY;  Service: Endoscopy;  Laterality: N/A;  . GIVENS CAPSULE STUDY N/A 07/12/2019   Procedure: GIVENS CAPSULE STUDY;  Surgeon: Doran Stabler, MD;  Location: [place];  Service: Gastroenterology;  Laterality: N/A;  . HEMOSTASIS CLIP PLACEMENT  07/12/2019   Procedure: HEMOSTASIS CLIP PLACEMENT;  Surgeon: Doran Stabler, MD;  Location: [facility] ENDOSCOPY;  Service: Gastroenterology;;  . POLYPECTOMY  07/12/2019  Procedure: POLYPECTOMY;  Surgeon: Doran Stabler, MD;  Location: [place];  Service: Gastroenterology;;  . SMALL BOWEL ENTEROSCOPY  07/10/2019  . SUBMUCOSAL TATTOO INJECTION  07/10/2019   Procedure: SUBMUCOSAL TATTOO INJECTION;  Surgeon: Doran Stabler, MD;  Location[facility] ENDOSCOPY;  Service: Gastroenterology;;  . TEE WITHOUT CARDIOVERSION  04/29/2019  . TEE WITHOUT CARDIOVERSION N/A 04/29/2019   Procedure:  TRANSESOPHAGEAL ECHOCARDIOGRAM (TEE);  Surgeon: Sanda Klein, MD;  Location: [facility] ENDOSCOPY;  Service: Cardiovascular;  Laterality: N/A;  . TUBAL LIGATION      Current Medications: No outpatient medications have been marked as taking for the 08/27/19 encounter (Appointment) with Richardo Priest, MD.     Allergies:   Ambien [zolpidem tartrate]   Social History   Socioeconomic History  . Marital status: Divorced    Spouse name: Not on file  . Number of children: Not on file  . Years of education: Not on file  . Highest education level: Not on file  Occupational History  . Not on file  Tobacco Use  . Smoking status: Former Smoker    Packs/day: 2.00    Years: 30.00    Pack years: 60.00    Types: Cigarettes    Quit date: 2001    Years since quitting: 20.0  . Smokeless tobacco: Never Used  Substance and Sexual Activity  . Alcohol use: Never  . Drug use: Never  . Sexual activity: Not on file  Other Topics Concern  . Not on file  Social History Narrative  . Not on file   Social Determinants of Health   Financial Resource Strain:   . Difficulty of Paying Living Expenses: Not on file  Food Insecurity:   . Worried About Charity fundraiser in the Last Year: Not on file  . Ran Out of Food in the Last Year: Not on file  Transportation Needs:   . Lack of Transportation (Medical): Not on file  . Lack of Transportation (Non-Medical): Not on file  Physical Activity:   . Days of Exercise per Week: Not on file  . Minutes of Exercise per Session: Not on file  Stress:   . Feeling of Stress : Not on file  Social Connections:   . Frequency of Communication with Friends and Family: Not on file  . Frequency of Social Gatherings with Friends and Family: Not on file  . Attends Religious Services: Not on file  . Active Member of Clubs or Organizations: Not on file  . Attends Archivist Meetings: Not on file  . Marital Status: Not on file     Family History: The patient's  ***family history includes Cancer in her mother; Heart disease in her father and mother; Hypercholesterolemia in her brother and mother; Osteoarthritis in her mother; Stroke in her mother. ROS:   Please see the history of present illness.    All other systems reviewed and are negative.  EKGs/Labs/Other Studies Reviewed:    The following studies were reviewed today:  EKG:  EKG ordered today and personally reviewed.  The ekg ordered today demonstrates ***  Recent Labs: 04/30/2019: TSH 3.246 06/12/2019: NT-Pro BNP 12,215 07/09/2019: ALT 36; B Natriuretic Peptide 144.1; Magnesium 1.9 07/13/2019: BUN 7; Creatinine, Ser 1.21; Hemoglobin 8.1; Platelets 543; Potassium 4.3; Sodium 135  Recent Lipid Panel No results found for: CHOL, TRIG, HDL, CHOLHDL, VLDL, LDLCALC, LDLDIRECT  Physical Exam:    VS:  LMP  (LMP Unknown) Comment: ?10 years ago?    Wt Readings from  Last 3 Encounters:  07/13/19 125 lb 11.2 oz (57 kg)  06/30/19 136 lb 11.2 oz (62 kg)  06/12/19 130 lb (59 kg)     GEN: *** Well nourished, well developed in no acute distress HEENT: Normal NECK: No JVD; No carotid bruits LYMPHATICS: No lymphadenopathy CARDIAC: ***RRR, no murmurs, rubs, gallops RESPIRATORY:  Clear to auscultation without rales, wheezing or rhonchi  ABDOMEN: Soft, non-tender, non-distended MUSCULOSKELETAL:  No edema; No deformity  SKIN: Warm and dry NEUROLOGIC:  Alert and oriented x 3 PSYCHIATRIC:  Normal affect    Signed, Shirlee More, MD  08/26/2019 4:27 PM    [facility]

## 2019-08-27 ENCOUNTER — Ambulatory Visit: Payer: Medicare HMO | Admitting: Cardiology

## 2019-09-02 ENCOUNTER — Other Ambulatory Visit: Payer: Self-pay | Admitting: Cardiology

## 2019-09-02 ENCOUNTER — Other Ambulatory Visit: Payer: Medicare HMO

## 2019-09-08 DIAGNOSIS — N189 Chronic kidney disease, unspecified: Secondary | ICD-10-CM | POA: Diagnosis not present

## 2019-09-08 DIAGNOSIS — R7989 Other specified abnormal findings of blood chemistry: Secondary | ICD-10-CM | POA: Diagnosis not present

## 2019-09-08 DIAGNOSIS — I4892 Unspecified atrial flutter: Secondary | ICD-10-CM | POA: Diagnosis not present

## 2019-09-08 DIAGNOSIS — E7800 Pure hypercholesterolemia, unspecified: Secondary | ICD-10-CM | POA: Diagnosis not present

## 2019-09-08 DIAGNOSIS — E663 Overweight: Secondary | ICD-10-CM | POA: Diagnosis not present

## 2019-09-08 DIAGNOSIS — I482 Chronic atrial fibrillation, unspecified: Secondary | ICD-10-CM | POA: Diagnosis not present

## 2019-09-08 DIAGNOSIS — E0590 Thyrotoxicosis, unspecified without thyrotoxic crisis or storm: Secondary | ICD-10-CM | POA: Diagnosis not present

## 2019-09-08 DIAGNOSIS — M25512 Pain in left shoulder: Secondary | ICD-10-CM | POA: Diagnosis not present

## 2019-09-08 DIAGNOSIS — R809 Proteinuria, unspecified: Secondary | ICD-10-CM | POA: Diagnosis not present

## 2019-09-08 DIAGNOSIS — J309 Allergic rhinitis, unspecified: Secondary | ICD-10-CM | POA: Diagnosis not present

## 2019-09-15 DIAGNOSIS — I5022 Chronic systolic (congestive) heart failure: Secondary | ICD-10-CM | POA: Diagnosis not present

## 2019-09-15 DIAGNOSIS — J309 Allergic rhinitis, unspecified: Secondary | ICD-10-CM | POA: Diagnosis not present

## 2019-09-15 DIAGNOSIS — E0590 Thyrotoxicosis, unspecified without thyrotoxic crisis or storm: Secondary | ICD-10-CM | POA: Diagnosis not present

## 2019-09-15 DIAGNOSIS — I4892 Unspecified atrial flutter: Secondary | ICD-10-CM | POA: Diagnosis not present

## 2019-09-15 DIAGNOSIS — B370 Candidal stomatitis: Secondary | ICD-10-CM | POA: Diagnosis not present

## 2019-09-15 DIAGNOSIS — R809 Proteinuria, unspecified: Secondary | ICD-10-CM | POA: Diagnosis not present

## 2019-09-15 DIAGNOSIS — I482 Chronic atrial fibrillation, unspecified: Secondary | ICD-10-CM | POA: Diagnosis not present

## 2019-09-15 DIAGNOSIS — N189 Chronic kidney disease, unspecified: Secondary | ICD-10-CM | POA: Diagnosis not present

## 2019-09-15 DIAGNOSIS — R7989 Other specified abnormal findings of blood chemistry: Secondary | ICD-10-CM | POA: Diagnosis not present

## 2019-09-15 DIAGNOSIS — E7800 Pure hypercholesterolemia, unspecified: Secondary | ICD-10-CM | POA: Diagnosis not present

## 2019-09-17 NOTE — Progress Notes (Deleted)
Cardiology Office Note:    Date:  09/17/2019   ID:  Brianna Porter, DOB 1961-06-17, MRN [id number]  PCP:  Cher Nakai, MD  Cardiologist:  Shirlee More, MD    Referring MD: Cher Nakai, MD    ASSESSMENT:    1. Typical atrial flutter ([place])   2. Chronic anticoagulation   3. On amiodarone therapy   4. Hypertensive heart disease with chronic systolic congestive heart failure ([place])   5. Mild CAD   6. NICM (nonischemic cardiomyopathy) ([place])   7. Hypertensive heart disease, unspecified whether heart failure present   8. Iron deficiency anemia, unspecified iron deficiency anemia type   9. Stage 3 chronic kidney disease, unspecified whether stage 3a or 3b CKD    PLAN:    In order of problems listed above:  1. ***   Next appointment: ***   Medication Adjustments/Labs and Tests Ordered: Current medicines are reviewed at length with the patient today.  Concerns regarding medicines are outlined above.  No orders of the defined types were placed in this encounter.  No orders of the defined types were placed in this encounter.   No chief complaint on file.   History of Present Illness:    Brianna Porter is a 59 y.o. female with a hx of atrial flutter cardioverted on amiodarone cardiomyopathy EF 20-25% mild CAD hyperthyroidism Stage 3 CKD hypertension and heart failure last seen 04/23/2019.Most recently has had a GI bleed and rib fractures.   Compliance with diet, lifestyle and medications: ***   Ref Range & Units 4 mo ago  NT-Pro BNP 0 - 287 pg/mL 3,353High     Past Medical History:  Diagnosis Date  . Anxiety and depression   . Atrial flutter ([place])   . Chronic systolic CHF (congestive heart failure) (HCC)    a. dx in setting of atrial fib/flutter - possibly tachy mediated. Coronary CTA with only mild CAD in 04/2019.  Marland Kitchen CKD (chronic kidney disease), stage II   . Confusion    a. persistent confusion during 04/2019 admission of unclear cause. Home meds adjusted. CT/MRI brain  nonacute.  . Diabetes ([place])   . Edema   . Elevated liver function tests   . Hypercholesteremia   . Hyperkalemia   . Hypertension   . Hypertensive heart and chronic kidney disease with systolic congestive heart failure ([place])   . Hyperthyroidism   . Hypokalemia   . Hypokalemia   . Hypomagnesemia   . Hyponatremia   . Iron deficiency anemia   . Mild CAD    a. Coronary CT 04/2019 - Minimal, Non-obstructive CAD.  Marland Kitchen NICM (nonischemic cardiomyopathy) ([place])   . Persistent atrial fibrillation ([place])   . Prolonged QT interval     Past Surgical History:  Procedure Laterality Date  . BIOPSY  06/29/2019   Procedure: BIOPSY;  Surgeon: Irene Shipper, MD;  Location: [place];  Service: Endoscopy;;  . CARDIOVERSION N/A 04/29/2019   Procedure: CARDIOVERSION;  Surgeon: Sanda Klein, MD;  Location: [place];  Service: Cardiovascular;  Laterality: N/A;  . CARDIOVERSION N/A 05/04/2019   Procedure: CARDIOVERSION;  Surgeon: Josue Hector, MD;  Location: [place];  Service: Cardiovascular;  Laterality: N/A;  . COLONOSCOPY WITH PROPOFOL N/A 07/12/2019   Procedure: COLONOSCOPY WITH PROPOFOL;  Surgeon: Doran Stabler, MD;  Location: [place];  Service: Gastroenterology;  Laterality: N/A;  . ENTEROSCOPY N/A 07/10/2019   Procedure: ENTEROSCOPY;  Surgeon: Doran Stabler, MD;  Location: [place];  Service: Gastroenterology;  Laterality:  N/A;  . ESOPHAGOGASTRODUODENOSCOPY  06/29/2019  . ESOPHAGOGASTRODUODENOSCOPY (EGD) WITH PROPOFOL N/A 06/29/2019   Procedure: ESOPHAGOGASTRODUODENOSCOPY (EGD) WITH PROPOFOL;  Surgeon: Irene Shipper, MD;  Location: [facility] ENDOSCOPY;  Service: Endoscopy;  Laterality: N/A;  . GIVENS CAPSULE STUDY N/A 07/12/2019   Procedure: GIVENS CAPSULE STUDY;  Surgeon: Doran Stabler, MD;  Location: [place];  Service: Gastroenterology;  Laterality: N/A;  . HEMOSTASIS CLIP PLACEMENT  07/12/2019   Procedure: HEMOSTASIS CLIP PLACEMENT;  Surgeon: Doran Stabler,  MD;  Location: [place];  Service: Gastroenterology;;  . POLYPECTOMY  07/12/2019   Procedure: POLYPECTOMY;  Surgeon: Doran Stabler, MD;  Location: [place];  Service: Gastroenterology;;  . SMALL BOWEL ENTEROSCOPY  07/10/2019  . SUBMUCOSAL TATTOO INJECTION  07/10/2019   Procedure: SUBMUCOSAL TATTOO INJECTION;  Surgeon: Doran Stabler, MD;  Location: [facility] ENDOSCOPY;  Service: Gastroenterology;;  . TEE WITHOUT CARDIOVERSION  04/29/2019  . TEE WITHOUT CARDIOVERSION N/A 04/29/2019   Procedure: TRANSESOPHAGEAL ECHOCARDIOGRAM (TEE);  Surgeon: Sanda Klein, MD;  Location: [facility] ENDOSCOPY;  Service: Cardiovascular;  Laterality: N/A;  . TUBAL LIGATION      Current Medications: No outpatient medications have been marked as taking for the 09/18/19 encounter (Appointment) with Richardo Priest, MD.     Allergies:   Ambien [zolpidem tartrate]   Social History   Socioeconomic History  . Marital status: Divorced    Spouse name: Not on file  . Number of children: Not on file  . Years of education: Not on file  . Highest education level: Not on file  Occupational History  . Not on file  Tobacco Use  . Smoking status: Former Smoker    Packs/day: 2.00    Years: 30.00    Pack years: 60.00    Types: Cigarettes    Quit date: 2001    Years since quitting: 20.0  . Smokeless tobacco: Never Used  Substance and Sexual Activity  . Alcohol use: Never  . Drug use: Never  . Sexual activity: Not on file  Other Topics Concern  . Not on file  Social History Narrative  . Not on file   Social Determinants of Health   Financial Resource Strain:   . Difficulty of Paying Living Expenses: Not on file  Food Insecurity:   . Worried About Charity fundraiser in the Last Year: Not on file  . Ran Out of Food in the Last Year: Not on file  Transportation Needs:   . Lack of Transportation (Medical): Not on file  . Lack of Transportation (Non-Medical): Not on file  Physical Activity:   . Days of  Exercise per Week: Not on file  . Minutes of Exercise per Session: Not on file  Stress:   . Feeling of Stress : Not on file  Social Connections:   . Frequency of Communication with Friends and Family: Not on file  . Frequency of Social Gatherings with Friends and Family: Not on file  . Attends Religious Services: Not on file  . Active Member of Clubs or Organizations: Not on file  . Attends Archivist Meetings: Not on file  . Marital Status: Not on file     Family History: The patient's ***family history includes Cancer in her mother; Heart disease in her father and mother; Hypercholesterolemia in her brother and mother; Osteoarthritis in her mother; Stroke in her mother. ROS:   Please see the history of present illness.    All other systems reviewed and  are negative.  EKGs/Labs/Other Studies Reviewed:    The following studies were reviewed today:  EKG:  EKG ordered today and personally reviewed.  The ekg ordered today demonstrates ***  Recent Labs: 04/30/2019: TSH 3.246 06/12/2019: NT-Pro BNP 12,215 07/09/2019: ALT 36; B Natriuretic Peptide 144.1; Magnesium 1.9 07/13/2019: BUN 7; Creatinine, Ser 1.21; Hemoglobin 8.1; Platelets 543; Potassium 4.3; Sodium 135  Recent Lipid Panel No results found for: CHOL, TRIG, HDL, CHOLHDL, VLDL, LDLCALC, LDLDIRECT  Physical Exam:    VS:  LMP  (LMP Unknown) Comment: ?10 years ago?    Wt Readings from Last 3 Encounters:  07/13/19 125 lb 11.2 oz (57 kg)  06/30/19 136 lb 11.2 oz (62 kg)  06/12/19 130 lb (59 kg)     GEN: *** Well nourished, well developed in no acute distress HEENT: Normal NECK: No JVD; No carotid bruits LYMPHATICS: No lymphadenopathy CARDIAC: ***RRR, no murmurs, rubs, gallops RESPIRATORY:  Clear to auscultation without rales, wheezing or rhonchi  ABDOMEN: Soft, non-tender, non-distended MUSCULOSKELETAL:  No edema; No deformity  SKIN: Warm and dry NEUROLOGIC:  Alert and oriented x 3 PSYCHIATRIC:  Normal  affect    Signed, Shirlee More, MD  09/17/2019 8:24 PM    [place]

## 2019-09-18 ENCOUNTER — Ambulatory Visit: Payer: Medicare HMO | Admitting: Cardiology

## 2019-09-22 DIAGNOSIS — I5022 Chronic systolic (congestive) heart failure: Secondary | ICD-10-CM | POA: Diagnosis not present

## 2019-09-22 DIAGNOSIS — E875 Hyperkalemia: Secondary | ICD-10-CM | POA: Diagnosis not present

## 2019-09-22 DIAGNOSIS — J309 Allergic rhinitis, unspecified: Secondary | ICD-10-CM | POA: Diagnosis not present

## 2019-09-22 DIAGNOSIS — I482 Chronic atrial fibrillation, unspecified: Secondary | ICD-10-CM | POA: Diagnosis not present

## 2019-09-22 DIAGNOSIS — R7989 Other specified abnormal findings of blood chemistry: Secondary | ICD-10-CM | POA: Diagnosis not present

## 2019-09-22 DIAGNOSIS — I4892 Unspecified atrial flutter: Secondary | ICD-10-CM | POA: Diagnosis not present

## 2019-09-22 DIAGNOSIS — R809 Proteinuria, unspecified: Secondary | ICD-10-CM | POA: Diagnosis not present

## 2019-09-22 DIAGNOSIS — D509 Iron deficiency anemia, unspecified: Secondary | ICD-10-CM | POA: Diagnosis not present

## 2019-09-22 DIAGNOSIS — N189 Chronic kidney disease, unspecified: Secondary | ICD-10-CM | POA: Diagnosis not present

## 2019-09-22 DIAGNOSIS — E7800 Pure hypercholesterolemia, unspecified: Secondary | ICD-10-CM | POA: Diagnosis not present

## 2019-09-22 DIAGNOSIS — E0590 Thyrotoxicosis, unspecified without thyrotoxic crisis or storm: Secondary | ICD-10-CM | POA: Diagnosis not present

## 2019-09-26 ENCOUNTER — Encounter: Payer: Self-pay | Admitting: Emergency Medicine

## 2019-09-29 DIAGNOSIS — I482 Chronic atrial fibrillation, unspecified: Secondary | ICD-10-CM | POA: Diagnosis not present

## 2019-09-29 DIAGNOSIS — I5022 Chronic systolic (congestive) heart failure: Secondary | ICD-10-CM | POA: Diagnosis not present

## 2019-09-29 DIAGNOSIS — J208 Acute bronchitis due to other specified organisms: Secondary | ICD-10-CM | POA: Diagnosis not present

## 2019-09-29 DIAGNOSIS — E875 Hyperkalemia: Secondary | ICD-10-CM | POA: Diagnosis not present

## 2019-09-29 DIAGNOSIS — R7989 Other specified abnormal findings of blood chemistry: Secondary | ICD-10-CM | POA: Diagnosis not present

## 2019-09-29 DIAGNOSIS — I4892 Unspecified atrial flutter: Secondary | ICD-10-CM | POA: Diagnosis not present

## 2019-09-29 DIAGNOSIS — J309 Allergic rhinitis, unspecified: Secondary | ICD-10-CM | POA: Diagnosis not present

## 2019-09-29 DIAGNOSIS — E7800 Pure hypercholesterolemia, unspecified: Secondary | ICD-10-CM | POA: Diagnosis not present

## 2019-09-29 DIAGNOSIS — N189 Chronic kidney disease, unspecified: Secondary | ICD-10-CM | POA: Diagnosis not present

## 2019-09-29 DIAGNOSIS — E0590 Thyrotoxicosis, unspecified without thyrotoxic crisis or storm: Secondary | ICD-10-CM | POA: Diagnosis not present

## 2019-09-30 DIAGNOSIS — I4892 Unspecified atrial flutter: Secondary | ICD-10-CM | POA: Diagnosis not present

## 2019-09-30 DIAGNOSIS — I482 Chronic atrial fibrillation, unspecified: Secondary | ICD-10-CM | POA: Diagnosis not present

## 2019-09-30 DIAGNOSIS — Z20822 Contact with and (suspected) exposure to covid-19: Secondary | ICD-10-CM | POA: Diagnosis not present

## 2019-09-30 DIAGNOSIS — R7989 Other specified abnormal findings of blood chemistry: Secondary | ICD-10-CM | POA: Diagnosis not present

## 2019-09-30 DIAGNOSIS — E663 Overweight: Secondary | ICD-10-CM | POA: Diagnosis not present

## 2019-10-12 ENCOUNTER — Other Ambulatory Visit: Payer: Self-pay

## 2019-10-12 ENCOUNTER — Encounter: Payer: Self-pay | Admitting: Cardiology

## 2019-10-12 ENCOUNTER — Ambulatory Visit (INDEPENDENT_AMBULATORY_CARE_PROVIDER_SITE_OTHER): Payer: Medicare HMO | Admitting: Cardiology

## 2019-10-12 VITALS — BP 120/70 | HR 56 | Ht 63.0 in | Wt 146.0 lb

## 2019-10-12 DIAGNOSIS — I480 Paroxysmal atrial fibrillation: Secondary | ICD-10-CM | POA: Diagnosis not present

## 2019-10-12 DIAGNOSIS — Z01812 Encounter for preprocedural laboratory examination: Secondary | ICD-10-CM | POA: Diagnosis not present

## 2019-10-12 DIAGNOSIS — I4819 Other persistent atrial fibrillation: Secondary | ICD-10-CM

## 2019-10-12 MED ORDER — METOPROLOL SUCCINATE ER 100 MG PO TB24: 100.0000 mg | ORAL_TABLET | Freq: Every day | ORAL | 1 refills | Status: DC

## 2019-10-12 MED ORDER — APIXABAN 5 MG PO TABS: 5.0000 mg | ORAL_TABLET | Freq: Two times a day (BID) | ORAL | 5 refills | Status: DC

## 2019-10-12 MED ORDER — AMIODARONE HCL 200 MG PO TABS: 200.0000 mg | ORAL_TABLET | Freq: Every day | ORAL | 1 refills | Status: DC

## 2019-10-12 NOTE — Patient Instructions (Signed)
Medication Instructions:  Your physician recommends that you continue on your current medications as directed. Please refer to the Current Medication list given to you today.  *If you need a refill on your cardiac medications before your next appointment, please call your pharmacy*  Lab Work: Your physician recommends that you return for lab work between 3/15 - 3/19 in the [place] office.  If you have labs (blood work) drawn today and your tests are completely normal, you will receive your results only by: Marland Kitchen MyChart Message (if you have MyChart) OR . A paper copy in the mail If you have any lab test that is abnormal or we need to change your treatment, we will call you to review the results.  Testing/Procedures: Your physician has requested that you have cardiac CT within 7 days PRIOR to your ablation. Cardiac computed tomography (CT) is a painless test that uses an x-ray machine to take clear, detailed pictures of your heart. For further information please visit [employer]uge[employer]iest[employer]tn. Please follow instruction sheet as given.   Your physician has recommended that you have an ablation. Catheter ablation is a medical procedure used to treat some cardiac arrhythmias (irregular heartbeats). During catheter ablation, a long, thin, flexible tube is put into a blood vessel in your groin (upper thigh), or neck. This tube is called an ablation catheter. It is then guided to your heart through the blood vessel. Radio frequency waves destroy small areas of heart tissue where abnormal heartbeats may cause an arrhythmia to start. Please see the instruction below.   Follow-Up: At [facility], you and your health needs are our priority.  As part of our continuing mission to provide you with exceptional heart care, we have created designated Provider Care Teams.  These Care Teams include your primary Cardiologist (physician) and Advanced Practice Providers (APPs -  Physician Assistants and Nurse  Practitioners) who all work together to provide you with the care you need, when you need it.  Your next appointment:   4 month(s)  The format for your next appointment:   In Person  Provider:   Allegra Lai, MD in [place]  Thank you for choosing [facility]!!   Trinidad Curet, RN [phone]   Other Instructions  CT INSTRUCTIONS Your cardiac CT will be scheduled at:   [facility] [address] [place], [place] [address] [phone]  Please arrive at the [facility] main entrance of [facility] on ___________ @ _____________, please arrive 30 minutes prior to test start time. Proceed to the [facility] Radiology Department (first floor) to check-in and test prep.  Please follow these instructions carefully (unless otherwise directed):  On the Night Before the Test: . Be sure to Drink plenty of water. . Do not consume any caffeinated/decaffeinated beverages or chocolate 12 hours prior to your test. . Do not take any antihistamines 12 hours prior to your test. . If you take Metformin do not take 24 hours prior to test.  On the Day of the Test: . Drink plenty of water. Do not drink any water within one hour of the test. . Do not eat any food 4 hours prior to the test. . You may take your regular medications prior to the test.  . Take your Toprol the morning of this test. . HOLD Furosemide/Hydrochlorothiazide morning of the test. . FEMALES- please wear underwire-free bra if available       After the Test: . Drink plenty of water. . After receiving IV contrast,  you may experience a mild flushed feeling. This is normal. . On occasion, you may experience a mild rash up to 24 hours after the test. This is not dangerous. If this occurs, you can take Benadryl 25 mg and increase your fluid intake. . If you experience trouble breathing, this can be serious. If it is severe call 911 IMMEDIATELY. If it is mild, please call our office. . If you take  any of these medications: Glipizide/Metformin, Avandament, Glucavance, please do not take 48 hours after completing test unless otherwise instructed.   Once we have confirmed authorization from your insurance company, we will call you to set up a date and time for your test.   For non-scheduling related questions, please contact the cardiac imaging nurse navigator should you have any questions/concerns: Marchia Bond, RN Navigator Cardiac Imaging [facility] [phone] mobile      Electrophysiology/Ablation Procedure Instructions   You are scheduled for a(n)  ablation on 11/18/2019 with Dr. Allegra Lai.   1.   Pre procedure testing-             A.  LAB WORK ---  Stop by the [place] office between 3/15 - 3/19  for your pre procedure blood work.                 B. COVID TEST-- On 11/14/2019 @ 11:00 am - You will go to [facility] hospital ([place]) for your Covid testing.   This is a drive thru test site.  There will be multiple testing areas.  Be sure to share with the first checkpoint that you are there for pre-procedure/surgery testing. This will put you into the right (yellow) lane that leads to the PAT testing team. Stay in your car and the nurse team will come to your car to test you.  After you are tested please go home and self quarantine until the day of your procedure.     2. On the day of your procedure 11/18/19 you will go to [facility] [phone] [address] [place]) at [employer].  You will go to the main entrance A [employer]) and enter where the [employer] parking staff are.  Your driver will drop you off and you will head down the hallway to ADMITTING.  You may have one support person come in to the hospital with you.  They will be asked to wait in the waiting room.   3.   Do not eat or drink after midnight prior to your procedure.   4.   Do NOT take any medications the morning of your procedure.   5.  Plan for an overnight stay.  If you  use your phone frequently bring your phone charger.   6. You will follow up with the AFIB clinic 4 weeks after your procedure.  You will follow up with Dr. Curt Bears  3 months after your procedure.  These appointments will be made for you.   * If you have ANY questions please call the office [phone] and ask for Chareese Sergent RN or send me a MyChart message   * Occasionally, EP Studies and ablations can become lengthy.  Please make your family aware of this before your procedure starts.  Average time ranges from 2-8 hours for EP studies/ablations.  Your physician will call your family after the procedure with the results.  Cardiac Ablation Cardiac ablation is a procedure to disable (ablate) a small amount of heart tissue in very specific places. The heart has many electrical connections. Sometimes these connections are abnormal and can cause the heart to beat very fast or irregularly. Ablating some of the problem areas can improve the heart rhythm or return it to normal. Ablation may be done for people who:  Have Wolff-Parkinson-White syndrome.  Have fast heart rhythms (tachycardia).  Have taken medicines for an abnormal heart rhythm (arrhythmia) that were not effective or caused side effects.  Have a high-risk heartbeat that may be life-threatening. During the procedure, a small incision is made in the neck or the groin, and a long, thin, flexible tube (catheter) is inserted into the incision and moved to the heart. Small devices (electrodes) on the tip of the catheter will send out electrical currents. A type of X-ray (fluoroscopy) will be used to help guide the catheter and to provide images of the heart. Tell a health care provider about:  Any allergies you have.  All medicines you are taking, including vitamins, herbs, eye drops, creams, and over-the-counter medicines.  Any problems you or family members have had with anesthetic medicines.  Any  blood disorders you have.  Any surgeries you have had.  Any medical conditions you have, such as kidney failure.  Whether you are pregnant or may be pregnant. What are the risks? Generally, this is a safe procedure. However, problems may occur, including:  Infection.  Bruising and bleeding at the catheter insertion site.  Bleeding into the chest, especially into the sac that surrounds the heart. This is a serious complication.  Stroke or blood clots.  Damage to other structures or organs.  Allergic reaction to medicines or dyes.  Need for a permanent pacemaker if the normal electrical system is damaged. A pacemaker is a small computer that sends electrical signals to the heart and helps your heart beat normally.  The procedure not being fully effective. This may not be recognized until months later. Repeat ablation procedures are sometimes required. What happens before the procedure?  Follow instructions from your health care provider about eating or drinking restrictions.  Ask your health care provider about: ? Changing or stopping your regular medicines. This is especially important if you are taking diabetes medicines or blood thinners. ? Taking medicines such as aspirin and ibuprofen. These medicines can thin your blood. Do not take these medicines before your procedure if your health care provider instructs you not to.  Plan to have someone take you home from the hospital or clinic.  If you will be going home right after the procedure, plan to have someone with you for 24 hours. What happens during the procedure?  To lower your risk of infection: ? Your health care team will wash or sanitize their hands. ? Your skin will be washed with soap. ? Hair may be removed from the incision area.  An IV tube will be inserted into one of your veins.  You will be given a medicine to help you relax (sedative).  The skin on your neck or groin will be numbed.  An incision will  be made in your neck or your groin.  A needle will be inserted through the incision and into a large vein in your neck or groin.  A catheter will be inserted into the needle and moved to your heart.  Dye may be injected through the catheter to help your surgeon see the area of the  heart that needs treatment.  Electrical currents will be sent from the catheter to ablate heart tissue in desired areas. There are three types of energy that may be used to ablate heart tissue: ? Heat (radiofrequency energy). ? Laser energy. ? Extreme cold (cryoablation).  When the necessary tissue has been ablated, the catheter will be removed.  Pressure will be held on the catheter insertion area to prevent excessive bleeding.  A bandage (dressing) will be placed over the catheter insertion area. The procedure may vary among health care providers and hospitals. What happens after the procedure?  Your blood pressure, heart rate, breathing rate, and blood oxygen level will be monitored until the medicines you were given have worn off.  Your catheter insertion area will be monitored for bleeding. You will need to lie still for a few hours to ensure that you do not bleed from the catheter insertion area.  Do not drive for 24 hours or as long as directed by your health care provider. Summary  Cardiac ablation is a procedure to disable (ablate) a small amount of heart tissue in very specific places. Ablating some of the problem areas can improve the heart rhythm or return it to normal.  During the procedure, electrical currents will be sent from the catheter to ablate heart tissue in desired areas. This information is not intended to replace advice given to you by your health care provider. Make sure you discuss any questions you have with your health care provider. Document Revised: 01/27/2018 Document Reviewed: 06/25/2016 Elsevier Patient Education  [employer].

## 2019-10-12 NOTE — Progress Notes (Signed)
Electrophysiology Office Note   Date:  10/12/2019   ID:  Brianna Porter, DOB 01/27/1961, MRN [id number]  PCP:  Cher Nakai, MD  Cardiologist:  Bettina Gavia Primary Electrophysiologist:  Zamyah Wiesman Meredith Leeds, MD    Chief Complaint: AF   History of Present Illness: Brianna Porter is a 59 y.o. female who is being seen today for the evaluation of af at the request of Cher Nakai, MD. Presenting today for electrophysiology evaluation.  She has a history of hyperthyroidism, persistent atrial fibrillation, hypertension, and diabetes.  She was recently found to be in atrial fibrillation with rapid rates.  She was started on Xarelto and scheduled for TEE and cardioversion September 2020.  At the time of her TEE she was found to have new LV systolic dysfunction.  She underwent successful cardioversion and was admitted to the hospital for work-up of CHF and antiarrhythmic drug initiation.  She had recurrent atrial fibrillation and was started on amiodarone.  She reverted back to sinus rhythm.  She feels palpitations when she is in atrial fibrillation.  Today, denies symptoms of palpitations, chest pain, shortness of breath, orthopnea, PND, lower extremity edema, claudication, dizziness, presyncope, syncope, bleeding, or neurologic sequela. The patient is tolerating medications without difficulties.  Overall she is doing well.  She has had no further episodes of atrial fibrillation.  Her shortness of breath is overall improved.   Past Medical History:  Diagnosis Date  . Anxiety and depression   . Atrial flutter ([place])   . Chronic systolic CHF (congestive heart failure) (HCC)    a. dx in setting of atrial fib/flutter - possibly tachy mediated. Coronary CTA with only mild CAD in 04/2019.  Marland Kitchen CKD (chronic kidney disease), stage II   . Confusion    a. persistent confusion during 04/2019 admission of unclear cause. Home meds adjusted. CT/MRI brain nonacute.  . Diabetes ([place])   . Edema   . Elevated liver  function tests   . Hypercholesteremia   . Hyperkalemia   . Hypertension   . Hypertensive heart and chronic kidney disease with systolic congestive heart failure ([place])   . Hyperthyroidism   . Hypokalemia   . Hypokalemia   . Hypomagnesemia   . Hyponatremia   . Iron deficiency anemia   . Mild CAD    a. Coronary CT 04/2019 - Minimal, Non-obstructive CAD.  Marland Kitchen NICM (nonischemic cardiomyopathy) ([place])   . Persistent atrial fibrillation ([place])   . Prolonged QT interval    Past Surgical History:  Procedure Laterality Date  . BIOPSY  06/29/2019   Procedure: BIOPSY;  Surgeon: Irene Shipper, MD;  Location: [place];  Service: Endoscopy;;  . CARDIOVERSION N/A 04/29/2019   Procedure: CARDIOVERSION;  Surgeon: Sanda Klein, MD;  Location: [place];  Service: Cardiovascular;  Laterality: N/A;  . CARDIOVERSION N/A 05/04/2019   Procedure: CARDIOVERSION;  Surgeon: Josue Hector, MD;  Location: [place];  Service: Cardiovascular;  Laterality: N/A;  . COLONOSCOPY WITH PROPOFOL N/A 07/12/2019   Procedure: COLONOSCOPY WITH PROPOFOL;  Surgeon: Doran Stabler, MD;  Location: [place];  Service: Gastroenterology;  Laterality: N/A;  . ENTEROSCOPY N/A 07/10/2019   Procedure: ENTEROSCOPY;  Surgeon: Doran Stabler, MD;  Location: [place];  Service: Gastroenterology;  Laterality: N/A;  . ESOPHAGOGASTRODUODENOSCOPY  06/29/2019  . ESOPHAGOGASTRODUODENOSCOPY (EGD) WITH PROPOFOL N/A 06/29/2019   Procedure: ESOPHAGOGASTRODUODENOSCOPY (EGD) WITH PROPOFOL;  Surgeon: Irene Shipper, MD;  [facility] ENDOSCOPY;  Service: Endoscopy;  Laterality: N/A;  . GIVENS CAPSULE STUDY N/A 07/12/2019  Procedure: GIVENS CAPSULE STUDY;  Surgeon: Doran Stabler, MD;  Location: [place];  Service: Gastroenterology;  Laterality: N/A;  . HEMOSTASIS CLIP PLACEMENT  07/12/2019   Procedure: HEMOSTASIS CLIP PLACEMENT;  Surgeon: Doran Stabler, MD;  Location: [place];  Service: Gastroenterology;;  .  POLYPECTOMY  07/12/2019   Procedure: POLYPECTOMY;  Surgeon: Doran Stabler, MD;  Location: [place];  Service: Gastroenterology;;  . SMALL BOWEL ENTEROSCOPY  07/10/2019  . SUBMUCOSAL TATTOO INJECTION  07/10/2019   Procedure: SUBMUCOSAL TATTOO INJECTION;  Surgeon: Doran Stabler, MD;  Location: [facility] ENDOSCOPY;  Service: Gastroenterology;;  . TEE WITHOUT CARDIOVERSION  04/29/2019  . TEE WITHOUT CARDIOVERSION N/A 04/29/2019   Procedure: TRANSESOPHAGEAL ECHOCARDIOGRAM (TEE);  Surgeon: Sanda Klein, MD;  Location: [place];  Service: Cardiovascular;  Laterality: N/A;  . TUBAL LIGATION       Current Outpatient Medications  Medication Sig Dispense Refill  . acetaminophen (TYLENOL) 325 MG tablet Take 2 tablets (650 mg total) by mouth every 6 (six) hours as needed for mild pain (or Fever >/= 101). 30 tablet 0  . DULoxetine (CYMBALTA) 60 MG capsule Take 60 mg by mouth 2 (two) times daily.    . furosemide (LASIX) 40 MG tablet Take 60 mg by mouth.    . Menthol, Topical Analgesic, (ICY HOT ADVANCED RELIEF EX) Apply 1 application topically 3 (three) times daily as needed (pain).     Marland Kitchen amiodarone (PACERONE) 200 MG tablet Take 1 tablet (200 mg total) by mouth daily. 90 tablet 1  . apixaban (ELIQUIS) 5 MG TABS tablet Take 1 tablet (5 mg total) by mouth 2 (two) times daily. 60 tablet 5  . metoprolol succinate (TOPROL-XL) 100 MG 24 hr tablet Take 1 tablet (100 mg total) by mouth daily. Take with or immediately following a meal. 90 tablet 1   No current facility-administered medications for this visit.    Allergies:   Ambien [zolpidem tartrate]   Social History:  The patient  reports that she quit smoking about 20 years ago. Her smoking use included cigarettes. She has a 60.00 pack-year smoking history. She has never used smokeless tobacco. She reports that she does not drink alcohol or use drugs.   Family History:  The patient's family history includes Cancer in her mother; Heart disease in  her father and mother; Hypercholesterolemia in her brother and mother; Osteoarthritis in her mother; Stroke in her mother.    ROS:  Please see the history of present illness.   Otherwise, review of systems is positive for none.   All other systems are reviewed and negative.   PHYSICAL EXAM: VS:  BP 120/70   Pulse (!) 56   Ht 5\' 3"  (1.6 m)   Wt 146 lb (66.2 kg)   LMP  (LMP Unknown) Comment: ?10 years ago?  SpO2 99%   BMI 25.86 kg/m  , BMI Body mass index is 25.86 kg/m. GEN: Well nourished, well developed, in no acute distress  HEENT: normal  Neck: no JVD, carotid bruits, or masses Cardiac: RRR; no murmurs, rubs, or gallops,no edema  Respiratory:  clear to auscultation bilaterally, normal work of breathing GI: soft, nontender, nondistended, + BS MS: no deformity or atrophy  Skin: warm and dry Neuro:  Strength and sensation are intact Psych: euthymic mood, full affect  EKG:  EKG is ordered today. Personal review of the ekg ordered shows sinus rhythm, rate 56   Recent Labs: 04/30/2019: TSH 3.246 06/12/2019: NT-Pro BNP 12,215 07/09/2019: ALT  36; B Natriuretic Peptide 144.1; Magnesium 1.9 07/13/2019: BUN 7; Creatinine, Ser 1.21; Hemoglobin 8.1; Platelets 543; Potassium 4.3; Sodium 135    Lipid Panel  No results found for: CHOL, TRIG, HDL, CHOLHDL, VLDL, LDLCALC, LDLDIRECT   Wt Readings from Last 3 Encounters:  10/12/19 146 lb (66.2 kg)  07/13/19 125 lb 11.2 oz (57 kg)  06/30/19 136 lb 11.2 oz (62 kg)      Other studies Reviewed: Additional studies/ records that were reviewed today include: TEE 04/29/19  Review of the above records today demonstrates:   1. The left ventricle has severely reduced systolic function, with an ejection fraction of 20-25%. The cavity size was normal. Left ventrical global hypokinesis without regional wall motion abnormalities.  2. The right ventricle has severely reduced systolic function. The cavity was normal. There is no increase in right  ventricular wall thickness. Right ventricular systolic pressure is normal.  3. No evidence of a thrombus present in the left atrial appendage.  4. Mild mitral insufficiency. The jet is centrally-directed.  5. The aortic root, ascending aorta, aortic arch and descending aorta are normal in size and structure.  6. No intracardiac thrombi or masses were visualized.  Coronary CT 05/06/2019 1. Coronary calcium score of 51. This was 56 percentile for age and sex matched control. 2. Normal coronary origin with right dominance. 3. Minimal, Non-obstructive CAD (<25%). 4. Atherosclerosis noted in the aortic root/ascending/descending Aorta.  ASSESSMENT AND PLAN:  1.  Chronic systolic heart failure due to nonischemic cardiomyopathy: Diagnosed when she was found to be in rapid atrial fibrillation.  Possibly due to a tachycardia mediated cardiomyopathy.  Currently on amiodarone and Xarelto.  Has an echo scheduled for March 4.  Danesha Kirchoff reassess her ejection fraction at that time.  Her ejection fraction remains low, she would likely benefit from ARB therapy with discussions for possible ICD implant.   2.  Persistent atrial fibrillation: Currently on amiodarone and Xarelto.  CHA2DS2-VASc of 3.  Maintain sinus rhythm.  Despite that, she would likely benefit from ablation to get off of her amiodarone.  This is also supported by [facility] AF data.  Risks and benefits of ablation were discussed which include bleeding, tamponade, heart block, stroke, damage surrounding organs.  She understands these risks and has agreed to the procedure.  Current medicines are reviewed at length with the patient today.   The patient does not have concerns regarding her medicines.  The following changes were made today: None  Labs/ tests ordered today include:  Orders Placed This Encounter  Procedures  . CT CARDIAC MORPH/PULM VEIN W/CM&W/O CA SCORE  . CT CORONARY FRACTIONAL FLOW RESERVE DATA PREP  . CT CORONARY FRACTIONAL FLOW RESERVE  FLUID ANALYSIS  . Basic metabolic panel  . CBC  . EKG 12-Lead     Disposition:   FU with Leyana Whidden 3 months  Signed, Hannelore Bova Meredith Leeds, MD  10/12/2019 11:16 AM     [facility] [address] [place]  [address] [phone] (office) [phone] (fax)

## 2019-10-15 NOTE — Progress Notes (Signed)
Cardiology Office Note:    Date:  10/16/2019   ID:  Brianna Porter, DOB 1960-10-24, MRN [id number]  PCP:  Cher Nakai, MD  Cardiologist:  Shirlee More, MD    Referring MD: Cher Nakai, MD    ASSESSMENT:    1. Paroxysmal atrial fibrillation (HCC)   2. On amiodarone therapy   3. Chronic anticoagulation   4. Chronic systolic heart failure ([place])   5. Hypertensive heart disease with chronic systolic congestive heart failure ([place])   6. Mild CAD   7. Stage 3 chronic kidney disease, unspecified whether stage 3a or 3b CKD   8. Iron deficiency anemia, unspecified iron deficiency anemia type    PLAN:    In order of problems listed above:  1. She is maintaining sinus rhythm on amiodarone and is pending PPI heart failure and cardiomyopathy 2. Continue amiodarone recheck her thyroid she looks abnormal appearance and is at risk for hypothyroidism Continue her anticoagulant despite ecchymoses she has had no clinical bleeding Heart failure is markedly improved continue her current diuretic along with beta-blocker Both CAD Check hemoglobin with a history of CKD and anemia.  Next appointment: See me 3 months after EP procedures   Medication Adjustments/Labs and Tests Ordered: Current medicines are reviewed at length with the patient today.  Concerns regarding medicines are outlined above.  Orders Placed This Encounter  Procedures  . Comp Met (CMET)  . TSH  . CBC  . Pro b natriuretic peptide (BNP)  . Protime-INR ( [facility] ONLY)   No orders of the defined types were placed in this encounter.   Chief Complaint  Patient presents with  . Follow-up  . Atrial Fibrillation  . Congestive Heart Failure    History of Present Illness:    Brianna Porter is a 59 y.o. female with a hx of atrial fibrillation last seen by me 04/23/2019.  At the time of cardioversion September 2020 was found to have cardiomyopathy and heart failure.  She initially converted to sinus rhythm had recurrent atrial  fibrillation was initiated on amiodarone and resume sinus rhythm soon afterwards.  She remains anticoagulated. Compliance with diet, lifestyle and medications: Yes  Heart rate at home runs 55 to 65 bpm.  She has had no palpitation or syncope.  She complains of easy bruising but no mucosal bleeding.  She has had no edema shortness of breath chest pain.  Pending PVI for atrial fibrillation end of the month  Her TEE [facility] independently reviewed 04/29/2019 with severe LV dysfunction ejection fraction estimated 20 to 25% severe right ventricular dysfunction no evidence of thrombus in the left atrium or left atrial appendage. Cardiac CTA independently reviewed 05/06/2019 calcium score 74 90th percentile for age and sex and minimal nonobstructive coronary artery disease less than 25%. Past Medical History:  Diagnosis Date  . Anxiety and depression   . Atrial flutter ([place])   . Chronic systolic CHF (congestive heart failure) (HCC)    a. dx in setting of atrial fib/flutter - possibly tachy mediated. Coronary CTA with only mild CAD in 04/2019.  Marland Kitchen CKD (chronic kidney disease), stage II   . Confusion    a. persistent confusion during 04/2019 admission of unclear cause. Home meds adjusted. CT/MRI brain nonacute.  . Diabetes ([place])   . Edema   . Elevated liver function tests   . Hypercholesteremia   . Hyperkalemia   . Hypertension   . Hypertensive heart and chronic kidney disease with systolic congestive heart failure ([place])   .  Hyperthyroidism   . Hypokalemia   . Hypokalemia   . Hypomagnesemia   . Hyponatremia   . Iron deficiency anemia   . Mild CAD    a. Coronary CT 04/2019 - Minimal, Non-obstructive CAD.  Marland Kitchen NICM (nonischemic cardiomyopathy) ([place])   . Persistent atrial fibrillation ([place])   . Prolonged QT interval     Past Surgical History:  Procedure Laterality Date  . BIOPSY  06/29/2019   Procedure: BIOPSY;  Surgeon: Irene Shipper, MD;  Location: [place];  Service: Endoscopy;;   . CARDIOVERSION N/A 04/29/2019   Procedure: CARDIOVERSION;  Surgeon: Sanda Klein, MD;  Location: [place];  Service: Cardiovascular;  Laterality: N/A;  . CARDIOVERSION N/A 05/04/2019   Procedure: CARDIOVERSION;  Surgeon: Josue Hector, MD;  Location: [place];  Service: Cardiovascular;  Laterality: N/A;  . COLONOSCOPY WITH PROPOFOL N/A 07/12/2019   Procedure: COLONOSCOPY WITH PROPOFOL;  Surgeon: Doran Stabler, MD;  Location: [place];  Service: Gastroenterology;  Laterality: N/A;  . ENTEROSCOPY N/A 07/10/2019   Procedure: ENTEROSCOPY;  Surgeon: Doran Stabler, MD;  Location: [place];  Service: Gastroenterology;  Laterality: N/A;  . ESOPHAGOGASTRODUODENOSCOPY  06/29/2019  . ESOPHAGOGASTRODUODENOSCOPY (EGD) WITH PROPOFOL N/A 06/29/2019   Procedure: ESOPHAGOGASTRODUODENOSCOPY (EGD) WITH PROPOFOL;  Surgeon: Irene Shipper, MD;  Location: [facility] ENDOSCOPY;  Service: Endoscopy;  Laterality: N/A;  . GIVENS CAPSULE STUDY N/A 07/12/2019   Procedure: GIVENS CAPSULE STUDY;  Surgeon: Doran Stabler, MD;  Location: [place];  Service: Gastroenterology;  Laterality: N/A;  . HEMOSTASIS CLIP PLACEMENT  07/12/2019   Procedure: HEMOSTASIS CLIP PLACEMENT;  Surgeon: Doran Stabler, MD;  Location: [place];  Service: Gastroenterology;;  . POLYPECTOMY  07/12/2019   Procedure: POLYPECTOMY;  Surgeon: Doran Stabler, MD;  Location: [place];  Service: Gastroenterology;;  . SMALL BOWEL ENTEROSCOPY  07/10/2019  . SUBMUCOSAL TATTOO INJECTION  07/10/2019   Procedure: SUBMUCOSAL TATTOO INJECTION;  Surgeon: Doran Stabler, MD;  Location: [facility] ENDOSCOPY;  Service: Gastroenterology;;  . TEE WITHOUT CARDIOVERSION  04/29/2019  . TEE WITHOUT CARDIOVERSION N/A 04/29/2019   Procedure: TRANSESOPHAGEAL ECHOCARDIOGRAM (TEE);  Surgeon: Sanda Klein, MD;  Location: [facility] ENDOSCOPY;  Service: Cardiovascular;  Laterality: N/A;  . TUBAL LIGATION      Current Medications: Current Meds    Medication Sig  . acetaminophen (TYLENOL) 325 MG tablet Take 2 tablets (650 mg total) by mouth every 6 (six) hours as needed for mild pain (or Fever >/= 101).  Marland Kitchen amiodarone (PACERONE) 200 MG tablet Take 1 tablet (200 mg total) by mouth daily.  Marland Kitchen apixaban (ELIQUIS) 5 MG TABS tablet Take 1 tablet (5 mg total) by mouth 2 (two) times daily.  . DULoxetine (CYMBALTA) 60 MG capsule Take 60 mg by mouth 2 (two) times daily.  . furosemide (LASIX) 40 MG tablet Take 60 mg by mouth.  . Menthol, Topical Analgesic, (ICY HOT ADVANCED RELIEF EX) Apply 1 application topically 3 (three) times daily as needed (pain).   . methimazole (TAPAZOLE) 5 MG tablet   . metoprolol succinate (TOPROL-XL) 100 MG 24 hr tablet Take 1 tablet (100 mg total) by mouth daily. Take with or immediately following a meal.  . pantoprazole (PROTONIX) 40 MG tablet   . [DISCONTINUED] albuterol (VENTOLIN HFA) 108 (90 Base) MCG/ACT inhaler Inhale 2 puffs into the lungs every 4 (four) hours as needed for wheezing or shortness of breath.      Allergies:   Ambien [zolpidem tartrate]   Social History  Socioeconomic History  . Marital status: Divorced    Spouse name: Not on file  . Number of children: Not on file  . Years of education: Not on file  . Highest education level: Not on file  Occupational History  . Not on file  Tobacco Use  . Smoking status: Former Smoker    Packs/day: 2.00    Years: 30.00    Pack years: 60.00    Types: Cigarettes    Quit date: 2001    Years since quitting: 20.1  . Smokeless tobacco: Never Used  Substance and Sexual Activity  . Alcohol use: Never  . Drug use: Never  . Sexual activity: Not Currently  Other Topics Concern  . Not on file  Social History Narrative  . Not on file   Social Determinants of Health   Financial Resource Strain:   . Difficulty of Paying Living Expenses: Not on file  Food Insecurity:   . Worried About Charity fundraiser in the Last Year: Not on file  . Ran Out of Food  in the Last Year: Not on file  Transportation Needs:   . Lack of Transportation (Medical): Not on file  . Lack of Transportation (Non-Medical): Not on file  Physical Activity:   . Days of Exercise per Week: Not on file  . Minutes of Exercise per Session: Not on file  Stress:   . Feeling of Stress : Not on file  Social Connections:   . Frequency of Communication with Friends and Family: Not on file  . Frequency of Social Gatherings with Friends and Family: Not on file  . Attends Religious Services: Not on file  . Active Member of Clubs or Organizations: Not on file  . Attends Archivist Meetings: Not on file  . Marital Status: Not on file     Family History: The patient's family history includes Cancer in her mother; Heart disease in her father and mother; Hypercholesterolemia in her brother and mother; Osteoarthritis in her mother; Stroke in her mother. ROS:   Please see the history of present illness.    All other systems reviewed and are negative.  EKGs/Labs/Other Studies Reviewed:    The following studies were reviewed today:  EKG:  EKG performed 10/12/2019 by electrophysiology sinus rhythm first-degree AV block QT interval 486 ms  Recent Labs: 04/30/2019: TSH 3.246 06/12/2019: NT-Pro BNP 12,215 07/09/2019: ALT 36; B Natriuretic Peptide 144.1; Magnesium 1.9 07/13/2019: BUN 7; Creatinine, Ser 1.21; Hemoglobin 8.1; Platelets 543; Potassium 4.3; Sodium 135  Recent Lipid Panel No results found for: CHOL, TRIG, HDL, CHOLHDL, VLDL, LDLCALC, LDLDIRECT  Physical Exam:    VS:  BP 130/64   Pulse 61   Temp 97.7 F (36.5 C)   Ht 5' 3" (1.6 m)   Wt 145 lb (65.8 kg)   LMP  (LMP Unknown) Comment: ?10 years ago?  SpO2 96%   BMI 25.69 kg/m     Wt Readings from Last 3 Encounters:  10/16/19 145 lb (65.8 kg)  10/12/19 146 lb (66.2 kg)  07/13/19 125 lb 11.2 oz (57 kg)     GEN: SHe has a sallow hypothyroid appearance well nourished, well developed in no acute  distress HEENT: Normal NECK: No JVD; No carotid bruits LYMPHATICS: No lymphadenopathy CARDIAC: RRR, no murmurs, rubs, gallops RESPIRATORY:  Clear to auscultation without rales, wheezing or rhonchi  ABDOMEN: Soft, non-tender, non-distended MUSCULOSKELETAL:  No edema; No deformity  SKIN: Warm and dry tubal ecchymoses NEUROLOGIC:  Alert and oriented  x 3 PSYCHIATRIC:  Normal affect    Signed, Shirlee More, MD  10/16/2019 10:15 AM    [place]

## 2019-10-16 ENCOUNTER — Other Ambulatory Visit: Payer: Self-pay

## 2019-10-16 ENCOUNTER — Ambulatory Visit (INDEPENDENT_AMBULATORY_CARE_PROVIDER_SITE_OTHER): Payer: Medicare HMO | Admitting: Cardiology

## 2019-10-16 ENCOUNTER — Encounter: Payer: Self-pay | Admitting: Cardiology

## 2019-10-16 VITALS — BP 130/64 | HR 61 | Temp 97.7°F | Ht 63.0 in | Wt 145.0 lb

## 2019-10-16 DIAGNOSIS — D509 Iron deficiency anemia, unspecified: Secondary | ICD-10-CM

## 2019-10-16 DIAGNOSIS — I2510 Atherosclerotic heart disease of native coronary artery without angina pectoris: Secondary | ICD-10-CM | POA: Diagnosis not present

## 2019-10-16 DIAGNOSIS — I110 Hypertensive heart disease with heart failure: Secondary | ICD-10-CM | POA: Diagnosis not present

## 2019-10-16 DIAGNOSIS — Z79899 Other long term (current) drug therapy: Secondary | ICD-10-CM | POA: Diagnosis not present

## 2019-10-16 DIAGNOSIS — Z7901 Long term (current) use of anticoagulants: Secondary | ICD-10-CM

## 2019-10-16 DIAGNOSIS — N183 Chronic kidney disease, stage 3 unspecified: Secondary | ICD-10-CM | POA: Diagnosis not present

## 2019-10-16 DIAGNOSIS — I480 Paroxysmal atrial fibrillation: Secondary | ICD-10-CM

## 2019-10-16 DIAGNOSIS — I5022 Chronic systolic (congestive) heart failure: Secondary | ICD-10-CM | POA: Diagnosis not present

## 2019-10-16 NOTE — Patient Instructions (Signed)
Medication Instructions:  Your physician recommends that you continue on your current medications as directed. Please refer to the Current Medication list given to you today.  *If you need a refill on your cardiac medications before your next appointment, please call your pharmacy*   Lab Work: TODAY:  CMP, TSH, CBC, PRO BNP, & PTINR  If you have labs (blood work) drawn today and your tests are completely normal, you will receive your results only by: Marland Kitchen MyChart Message (if you have MyChart) OR . A paper copy in the mail If you have any lab test that is abnormal or we need to change your treatment, we will call you to review the results.   Testing/Procedures: None ordered   Follow-Up: At [facility], you and your health needs are our priority.  As part of our continuing mission to provide you with exceptional heart care, we have created designated Provider Care Teams.  These Care Teams include your primary Cardiologist (physician) and Advanced Practice Providers (APPs -  Physician Assistants and Nurse Practitioners) who all work together to provide you with the care you need, when you need it.  We recommend signing up for the patient portal called "MyChart".  Sign up information is provided on this After Visit Summary.  MyChart is used to connect with patients for Virtual Visits (Telemedicine).  Patients are able to view lab/test results, encounter notes, upcoming appointments, etc.  Non-urgent messages can be sent to your provider as well.   To learn more about what you can do with MyChart, go to [email or web address].    Your next appointment:   3 month(s)  The format for your next appointment:   In Person  Provider:   Shirlee More, MD   Other Instructions

## 2019-10-17 LAB — COMPREHENSIVE METABOLIC PANEL
ALT: 60 IU/L — ABNORMAL HIGH (ref 0–32)
AST: 39 IU/L (ref 0–40)
Albumin/Globulin Ratio: 2.2 (ref 1.2–2.2)
Albumin: 4.2 g/dL (ref 3.8–4.9)
Alkaline Phosphatase: 117 IU/L (ref 39–117)
BUN/Creatinine Ratio: 11 (ref 9–23)
BUN: 22 mg/dL (ref 6–24)
Bilirubin Total: <0.2 mg/dL (ref 0.0–1.2)
CO2: 23 mmol/L (ref 20–29)
Calcium: 9.4 mg/dL (ref 8.7–10.2)
Chloride: 92 mmol/L — ABNORMAL LOW (ref 96–106)
Creatinine, Ser: 2.07 mg/dL — ABNORMAL HIGH (ref 0.57–1.00)
GFR calc Af Amer: 30 mL/min/{1.73_m2} — ABNORMAL LOW (ref >59)
GFR calc non Af Amer: 26 mL/min/{1.73_m2} — ABNORMAL LOW (ref >59)
Globulin, Total: 1.9 g/dL (ref 1.5–4.5)
Glucose: 169 mg/dL — ABNORMAL HIGH (ref 65–99)
Potassium: 4 mmol/L (ref 3.5–5.2)
Sodium: 132 mmol/L — ABNORMAL LOW (ref 134–144)
Total Protein: 6.1 g/dL (ref 6.0–8.5)

## 2019-10-17 LAB — PRO B NATRIURETIC PEPTIDE: NT-Pro BNP: 449 pg/mL — ABNORMAL HIGH (ref 0–287)

## 2019-10-17 LAB — CBC
Hematocrit: 31.3 % — ABNORMAL LOW (ref 34.0–46.6)
Hemoglobin: 10.5 g/dL — ABNORMAL LOW (ref 11.1–15.9)
MCH: 30.1 pg (ref 26.6–33.0)
MCHC: 33.5 g/dL (ref 31.5–35.7)
MCV: 90 fL (ref 79–97)
Platelets: 421 10*3/uL (ref 150–450)
RBC: 3.49 x10E6/uL — ABNORMAL LOW (ref 3.77–5.28)
RDW: 14 % (ref 11.7–15.4)
WBC: 8.4 10*3/uL (ref 3.4–10.8)

## 2019-10-17 LAB — PROTIME-INR
INR: 1.1 (ref 0.9–1.2)
Prothrombin Time: 11.3 s (ref 9.1–12.0)

## 2019-10-17 LAB — TSH: TSH: 70.5 u[IU]/mL — ABNORMAL HIGH (ref 0.450–4.500)

## 2019-10-19 ENCOUNTER — Telehealth: Payer: Self-pay

## 2019-10-19 NOTE — Telephone Encounter (Signed)
Pt was informed of her lab results by Dr. Bettina Gavia. I went into her chart and discounted the Methimazole that he stopped today.

## 2019-10-19 NOTE — Telephone Encounter (Signed)
-----   Message from Richardo Priest, MD sent at 10/19/2019  8:44 AM EST ----- Normal or stable result  I called her stopped methimazole seeing her PCP tomorrow

## 2019-10-20 DIAGNOSIS — N189 Chronic kidney disease, unspecified: Secondary | ICD-10-CM | POA: Diagnosis not present

## 2019-10-20 DIAGNOSIS — I5022 Chronic systolic (congestive) heart failure: Secondary | ICD-10-CM | POA: Diagnosis not present

## 2019-10-20 DIAGNOSIS — I482 Chronic atrial fibrillation, unspecified: Secondary | ICD-10-CM | POA: Diagnosis not present

## 2019-10-20 DIAGNOSIS — J309 Allergic rhinitis, unspecified: Secondary | ICD-10-CM | POA: Diagnosis not present

## 2019-10-20 DIAGNOSIS — E7800 Pure hypercholesterolemia, unspecified: Secondary | ICD-10-CM | POA: Diagnosis not present

## 2019-10-20 DIAGNOSIS — R7989 Other specified abnormal findings of blood chemistry: Secondary | ICD-10-CM | POA: Diagnosis not present

## 2019-10-20 DIAGNOSIS — E875 Hyperkalemia: Secondary | ICD-10-CM | POA: Diagnosis not present

## 2019-10-20 DIAGNOSIS — N1831 Chronic kidney disease, stage 3a: Secondary | ICD-10-CM | POA: Diagnosis not present

## 2019-10-20 DIAGNOSIS — E0590 Thyrotoxicosis, unspecified without thyrotoxic crisis or storm: Secondary | ICD-10-CM | POA: Diagnosis not present

## 2019-10-20 DIAGNOSIS — I4892 Unspecified atrial flutter: Secondary | ICD-10-CM | POA: Diagnosis not present

## 2019-10-20 DIAGNOSIS — M25512 Pain in left shoulder: Secondary | ICD-10-CM | POA: Diagnosis not present

## 2019-10-22 ENCOUNTER — Other Ambulatory Visit: Payer: Medicare HMO

## 2019-10-29 DIAGNOSIS — S7002XA Contusion of left hip, initial encounter: Secondary | ICD-10-CM | POA: Diagnosis not present

## 2019-10-29 DIAGNOSIS — M25512 Pain in left shoulder: Secondary | ICD-10-CM | POA: Diagnosis not present

## 2019-10-29 DIAGNOSIS — S40012A Contusion of left shoulder, initial encounter: Secondary | ICD-10-CM | POA: Diagnosis not present

## 2019-11-03 DIAGNOSIS — E7800 Pure hypercholesterolemia, unspecified: Secondary | ICD-10-CM | POA: Diagnosis not present

## 2019-11-03 DIAGNOSIS — R2681 Unsteadiness on feet: Secondary | ICD-10-CM | POA: Diagnosis not present

## 2019-11-03 DIAGNOSIS — J309 Allergic rhinitis, unspecified: Secondary | ICD-10-CM | POA: Diagnosis not present

## 2019-11-03 DIAGNOSIS — R7989 Other specified abnormal findings of blood chemistry: Secondary | ICD-10-CM | POA: Diagnosis not present

## 2019-11-03 DIAGNOSIS — I4892 Unspecified atrial flutter: Secondary | ICD-10-CM | POA: Diagnosis not present

## 2019-11-03 DIAGNOSIS — I482 Chronic atrial fibrillation, unspecified: Secondary | ICD-10-CM | POA: Diagnosis not present

## 2019-11-03 DIAGNOSIS — M25512 Pain in left shoulder: Secondary | ICD-10-CM | POA: Diagnosis not present

## 2019-11-03 DIAGNOSIS — I5022 Chronic systolic (congestive) heart failure: Secondary | ICD-10-CM | POA: Diagnosis not present

## 2019-11-03 DIAGNOSIS — N189 Chronic kidney disease, unspecified: Secondary | ICD-10-CM | POA: Diagnosis not present

## 2019-11-03 DIAGNOSIS — E875 Hyperkalemia: Secondary | ICD-10-CM | POA: Diagnosis not present

## 2019-11-06 ENCOUNTER — Telehealth (HOSPITAL_COMMUNITY): Payer: Self-pay | Admitting: Nurse Practitioner

## 2019-11-06 NOTE — Telephone Encounter (Signed)
Called and left message for patient to call us.  Pt needs 4 wk f/u after ablation 3/31 with Dr. Curt Bears.

## 2019-11-09 NOTE — Telephone Encounter (Signed)
Spoke with pt, she is aware of appt 12/16/19 with [facility].

## 2019-11-10 ENCOUNTER — Other Ambulatory Visit: Payer: Self-pay

## 2019-11-10 ENCOUNTER — Encounter: Payer: Medicare HMO | Admitting: Cardiology

## 2019-11-10 NOTE — Progress Notes (Signed)
This encounter was created in error - please disregard.

## 2019-11-11 ENCOUNTER — Other Ambulatory Visit: Payer: Self-pay | Admitting: *Deleted

## 2019-11-11 DIAGNOSIS — I4819 Other persistent atrial fibrillation: Secondary | ICD-10-CM | POA: Diagnosis not present

## 2019-11-11 DIAGNOSIS — Z01812 Encounter for preprocedural laboratory examination: Secondary | ICD-10-CM | POA: Diagnosis not present

## 2019-11-12 ENCOUNTER — Telehealth: Payer: Self-pay

## 2019-11-12 LAB — CBC
Hematocrit: 28.5 % — ABNORMAL LOW (ref 34.0–46.6)
Hemoglobin: 9.4 g/dL — ABNORMAL LOW (ref 11.1–15.9)
MCH: 31.1 pg (ref 26.6–33.0)
MCHC: 33 g/dL (ref 31.5–35.7)
MCV: 94 fL (ref 79–97)
Platelets: 471 10*3/uL — ABNORMAL HIGH (ref 150–450)
RBC: 3.02 x10E6/uL — ABNORMAL LOW (ref 3.77–5.28)
RDW: 14.7 % (ref 11.7–15.4)
WBC: 6.7 10*3/uL (ref 3.4–10.8)

## 2019-11-12 LAB — BASIC METABOLIC PANEL
BUN/Creatinine Ratio: 11 (ref 9–23)
BUN: 23 mg/dL (ref 6–24)
CO2: 24 mmol/L (ref 20–29)
Calcium: 9.7 mg/dL (ref 8.7–10.2)
Chloride: 96 mmol/L (ref 96–106)
Creatinine, Ser: 2.05 mg/dL — ABNORMAL HIGH (ref 0.57–1.00)
GFR calc Af Amer: 30 mL/min/{1.73_m2} — ABNORMAL LOW (ref >59)
GFR calc non Af Amer: 26 mL/min/{1.73_m2} — ABNORMAL LOW (ref >59)
Glucose: 144 mg/dL — ABNORMAL HIGH (ref 65–99)
Potassium: 4.8 mmol/L (ref 3.5–5.2)
Sodium: 136 mmol/L (ref 134–144)

## 2019-11-12 NOTE — Telephone Encounter (Signed)
New message   Called patient to cancel Cardiac ct for 11/13/19 , patient would like call from nurse about her ablation and Covid testing .   Please call

## 2019-11-13 ENCOUNTER — Ambulatory Visit (HOSPITAL_COMMUNITY): Payer: Medicare HMO

## 2019-11-13 NOTE — Telephone Encounter (Signed)
-----   Message from Will Meredith Leeds, MD sent at 11/12/2019  8:13 AM EDT ----- Stable preop labs. Needs to discuss persistent anemia and worsened renal function with PCP prior to ablaiton.

## 2019-11-13 NOTE — Telephone Encounter (Addendum)
Spoke to pt.  Instructed ablation procedure for next week being cancelled secondary to kidney function. Aware to f/u w/ PCP for further discussion/evaluation.  Pt agreeable and states she has had issues in the past.  Pt will call me to update me when/if kidney function improves and will get her back in w/ Dr. Curt Bears to discuss treatment plan.  Pt is agreeable to plan.  Lab result faxed to PCP

## 2019-11-14 ENCOUNTER — Other Ambulatory Visit (HOSPITAL_COMMUNITY): Payer: Medicare HMO

## 2019-11-17 ENCOUNTER — Telehealth: Payer: Medicare HMO | Admitting: Cardiology

## 2019-11-17 DIAGNOSIS — D509 Iron deficiency anemia, unspecified: Secondary | ICD-10-CM | POA: Diagnosis not present

## 2019-11-17 DIAGNOSIS — R7989 Other specified abnormal findings of blood chemistry: Secondary | ICD-10-CM | POA: Diagnosis not present

## 2019-11-17 DIAGNOSIS — E7800 Pure hypercholesterolemia, unspecified: Secondary | ICD-10-CM | POA: Diagnosis not present

## 2019-11-17 DIAGNOSIS — R809 Proteinuria, unspecified: Secondary | ICD-10-CM | POA: Diagnosis not present

## 2019-11-17 DIAGNOSIS — J309 Allergic rhinitis, unspecified: Secondary | ICD-10-CM | POA: Diagnosis not present

## 2019-11-17 DIAGNOSIS — I4892 Unspecified atrial flutter: Secondary | ICD-10-CM | POA: Diagnosis not present

## 2019-11-17 DIAGNOSIS — E875 Hyperkalemia: Secondary | ICD-10-CM | POA: Diagnosis not present

## 2019-11-17 DIAGNOSIS — I5022 Chronic systolic (congestive) heart failure: Secondary | ICD-10-CM | POA: Diagnosis not present

## 2019-11-17 DIAGNOSIS — E0590 Thyrotoxicosis, unspecified without thyrotoxic crisis or storm: Secondary | ICD-10-CM | POA: Diagnosis not present

## 2019-11-17 DIAGNOSIS — I482 Chronic atrial fibrillation, unspecified: Secondary | ICD-10-CM | POA: Diagnosis not present

## 2019-11-17 DIAGNOSIS — N189 Chronic kidney disease, unspecified: Secondary | ICD-10-CM | POA: Diagnosis not present

## 2019-11-18 ENCOUNTER — Encounter (HOSPITAL_COMMUNITY): Admission: RE | Payer: Self-pay | Source: Home / Self Care

## 2019-11-18 ENCOUNTER — Ambulatory Visit (HOSPITAL_COMMUNITY): Admission: RE | Admit: 2019-11-18 | Payer: Medicare HMO | Source: Home / Self Care | Admitting: Cardiology

## 2019-11-18 SURGERY — ATRIAL FIBRILLATION ABLATION
Anesthesia: General

## 2019-12-15 DIAGNOSIS — I482 Chronic atrial fibrillation, unspecified: Secondary | ICD-10-CM | POA: Diagnosis not present

## 2019-12-15 DIAGNOSIS — E119 Type 2 diabetes mellitus without complications: Secondary | ICD-10-CM | POA: Diagnosis not present

## 2019-12-15 DIAGNOSIS — I10 Essential (primary) hypertension: Secondary | ICD-10-CM | POA: Diagnosis not present

## 2019-12-15 DIAGNOSIS — R7989 Other specified abnormal findings of blood chemistry: Secondary | ICD-10-CM | POA: Diagnosis not present

## 2019-12-15 DIAGNOSIS — M159 Polyosteoarthritis, unspecified: Secondary | ICD-10-CM | POA: Diagnosis not present

## 2019-12-15 DIAGNOSIS — M25511 Pain in right shoulder: Secondary | ICD-10-CM | POA: Diagnosis not present

## 2019-12-15 DIAGNOSIS — M25512 Pain in left shoulder: Secondary | ICD-10-CM | POA: Diagnosis not present

## 2019-12-15 DIAGNOSIS — I4892 Unspecified atrial flutter: Secondary | ICD-10-CM | POA: Diagnosis not present

## 2019-12-15 DIAGNOSIS — J309 Allergic rhinitis, unspecified: Secondary | ICD-10-CM | POA: Diagnosis not present

## 2019-12-15 DIAGNOSIS — E0590 Thyrotoxicosis, unspecified without thyrotoxic crisis or storm: Secondary | ICD-10-CM | POA: Diagnosis not present

## 2019-12-15 DIAGNOSIS — R809 Proteinuria, unspecified: Secondary | ICD-10-CM | POA: Diagnosis not present

## 2019-12-15 DIAGNOSIS — E7800 Pure hypercholesterolemia, unspecified: Secondary | ICD-10-CM | POA: Diagnosis not present

## 2019-12-16 ENCOUNTER — Ambulatory Visit (HOSPITAL_COMMUNITY): Payer: Medicare HMO | Admitting: Nurse Practitioner

## 2019-12-22 ENCOUNTER — Emergency Department (HOSPITAL_COMMUNITY): Payer: Medicare HMO

## 2019-12-22 ENCOUNTER — Inpatient Hospital Stay (HOSPITAL_COMMUNITY)
Admission: EM | Admit: 2019-12-22 | Discharge: 2019-12-30 | DRG: 683 | Disposition: A | Discharge status: Skilled Nursing Facility | Payer: Medicare HMO | Attending: Internal Medicine | Admitting: Internal Medicine

## 2019-12-22 DIAGNOSIS — I428 Other cardiomyopathies: Secondary | ICD-10-CM | POA: Diagnosis present

## 2019-12-22 DIAGNOSIS — E781 Pure hyperglyceridemia: Secondary | ICD-10-CM | POA: Diagnosis present

## 2019-12-22 DIAGNOSIS — I4819 Other persistent atrial fibrillation: Secondary | ICD-10-CM | POA: Diagnosis present

## 2019-12-22 DIAGNOSIS — E871 Hypo-osmolality and hyponatremia: Secondary | ICD-10-CM | POA: Diagnosis present

## 2019-12-22 DIAGNOSIS — Y92009 Unspecified place in unspecified non-institutional (private) residence as the place of occurrence of the external cause: Secondary | ICD-10-CM

## 2019-12-22 DIAGNOSIS — E0590 Thyrotoxicosis, unspecified without thyrotoxic crisis or storm: Secondary | ICD-10-CM | POA: Diagnosis present

## 2019-12-22 DIAGNOSIS — S0001XA Abrasion of scalp, initial encounter: Secondary | ICD-10-CM | POA: Diagnosis not present

## 2019-12-22 DIAGNOSIS — T148XXA Other injury of unspecified body region, initial encounter: Secondary | ICD-10-CM

## 2019-12-22 DIAGNOSIS — S8992XA Unspecified injury of left lower leg, initial encounter: Secondary | ICD-10-CM | POA: Diagnosis not present

## 2019-12-22 DIAGNOSIS — D638 Anemia in other chronic diseases classified elsewhere: Secondary | ICD-10-CM | POA: Diagnosis present

## 2019-12-22 DIAGNOSIS — R42 Dizziness and giddiness: Secondary | ICD-10-CM | POA: Diagnosis not present

## 2019-12-22 DIAGNOSIS — S82152A Displaced fracture of left tibial tuberosity, initial encounter for closed fracture: Secondary | ICD-10-CM | POA: Diagnosis present

## 2019-12-22 DIAGNOSIS — I2510 Atherosclerotic heart disease of native coronary artery without angina pectoris: Secondary | ICD-10-CM | POA: Diagnosis present

## 2019-12-22 DIAGNOSIS — S0232XA Fracture of orbital floor, left side, initial encounter for closed fracture: Secondary | ICD-10-CM | POA: Diagnosis present

## 2019-12-22 DIAGNOSIS — R531 Weakness: Secondary | ICD-10-CM | POA: Diagnosis not present

## 2019-12-22 DIAGNOSIS — M25512 Pain in left shoulder: Secondary | ICD-10-CM | POA: Diagnosis present

## 2019-12-22 DIAGNOSIS — N179 Acute kidney failure, unspecified: Secondary | ICD-10-CM | POA: Diagnosis not present

## 2019-12-22 DIAGNOSIS — Z7901 Long term (current) use of anticoagulants: Secondary | ICD-10-CM

## 2019-12-22 DIAGNOSIS — M25562 Pain in left knee: Secondary | ICD-10-CM | POA: Diagnosis not present

## 2019-12-22 DIAGNOSIS — S01412A Laceration without foreign body of left cheek and temporomandibular area, initial encounter: Secondary | ICD-10-CM | POA: Diagnosis present

## 2019-12-22 DIAGNOSIS — M25552 Pain in left hip: Secondary | ICD-10-CM | POA: Diagnosis not present

## 2019-12-22 DIAGNOSIS — S0081XA Abrasion of other part of head, initial encounter: Secondary | ICD-10-CM | POA: Diagnosis not present

## 2019-12-22 DIAGNOSIS — F419 Anxiety disorder, unspecified: Secondary | ICD-10-CM | POA: Diagnosis present

## 2019-12-22 DIAGNOSIS — Z03818 Encounter for observation for suspected exposure to other biological agents ruled out: Secondary | ICD-10-CM | POA: Diagnosis not present

## 2019-12-22 DIAGNOSIS — I5022 Chronic systolic (congestive) heart failure: Secondary | ICD-10-CM | POA: Diagnosis present

## 2019-12-22 DIAGNOSIS — W19XXXA Unspecified fall, initial encounter: Secondary | ICD-10-CM | POA: Diagnosis not present

## 2019-12-22 DIAGNOSIS — F329 Major depressive disorder, single episode, unspecified: Secondary | ICD-10-CM | POA: Diagnosis present

## 2019-12-22 DIAGNOSIS — I10 Essential (primary) hypertension: Secondary | ICD-10-CM | POA: Diagnosis not present

## 2019-12-22 DIAGNOSIS — S3993XA Unspecified injury of pelvis, initial encounter: Secondary | ICD-10-CM | POA: Diagnosis not present

## 2019-12-22 DIAGNOSIS — Z9181 History of falling: Secondary | ICD-10-CM

## 2019-12-22 DIAGNOSIS — K219 Gastro-esophageal reflux disease without esophagitis: Secondary | ICD-10-CM | POA: Diagnosis present

## 2019-12-22 DIAGNOSIS — M25551 Pain in right hip: Secondary | ICD-10-CM | POA: Diagnosis not present

## 2019-12-22 DIAGNOSIS — G4489 Other headache syndrome: Secondary | ICD-10-CM | POA: Diagnosis not present

## 2019-12-22 DIAGNOSIS — R296 Repeated falls: Secondary | ICD-10-CM | POA: Diagnosis present

## 2019-12-22 DIAGNOSIS — R52 Pain, unspecified: Secondary | ICD-10-CM | POA: Diagnosis not present

## 2019-12-22 DIAGNOSIS — S199XXA Unspecified injury of neck, initial encounter: Secondary | ICD-10-CM | POA: Diagnosis not present

## 2019-12-22 DIAGNOSIS — S0101XA Laceration without foreign body of scalp, initial encounter: Secondary | ICD-10-CM | POA: Diagnosis present

## 2019-12-22 DIAGNOSIS — D539 Nutritional anemia, unspecified: Secondary | ICD-10-CM | POA: Diagnosis present

## 2019-12-22 DIAGNOSIS — T462X5A Adverse effect of other antidysrhythmic drugs, initial encounter: Secondary | ICD-10-CM | POA: Diagnosis present

## 2019-12-22 DIAGNOSIS — I130 Hypertensive heart and chronic kidney disease with heart failure and stage 1 through stage 4 chronic kidney disease, or unspecified chronic kidney disease: Secondary | ICD-10-CM | POA: Diagnosis present

## 2019-12-22 DIAGNOSIS — N1831 Chronic kidney disease, stage 3a: Secondary | ICD-10-CM | POA: Diagnosis present

## 2019-12-22 DIAGNOSIS — E1122 Type 2 diabetes mellitus with diabetic chronic kidney disease: Secondary | ICD-10-CM | POA: Diagnosis present

## 2019-12-22 DIAGNOSIS — Z79899 Other long term (current) drug therapy: Secondary | ICD-10-CM

## 2019-12-22 DIAGNOSIS — E7800 Pure hypercholesterolemia, unspecified: Secondary | ICD-10-CM | POA: Diagnosis present

## 2019-12-22 DIAGNOSIS — S299XXA Unspecified injury of thorax, initial encounter: Secondary | ICD-10-CM | POA: Diagnosis not present

## 2019-12-22 DIAGNOSIS — W1830XA Fall on same level, unspecified, initial encounter: Secondary | ICD-10-CM | POA: Diagnosis present

## 2019-12-22 DIAGNOSIS — Z87891 Personal history of nicotine dependence: Secondary | ICD-10-CM

## 2019-12-22 DIAGNOSIS — Z20822 Contact with and (suspected) exposure to covid-19: Secondary | ICD-10-CM | POA: Diagnosis present

## 2019-12-22 LAB — URINALYSIS, ROUTINE W REFLEX MICROSCOPIC
Bilirubin Urine: NEGATIVE
Glucose, UA: NEGATIVE mg/dL
Hgb urine dipstick: NEGATIVE
Ketones, ur: NEGATIVE mg/dL
Leukocytes,Ua: NEGATIVE
Nitrite: NEGATIVE
Protein, ur: NEGATIVE mg/dL
Specific Gravity, Urine: 1.011 (ref 1.005–1.030)
pH: 6 (ref 5.0–8.0)

## 2019-12-22 LAB — CBC WITH DIFFERENTIAL/PLATELET
Abs Immature Granulocytes: 0 10*3/uL (ref 0.00–0.07)
Basophils Absolute: 0.2 10*3/uL — ABNORMAL HIGH (ref 0.0–0.1)
Basophils Relative: 2 %
Eosinophils Absolute: 0.2 10*3/uL (ref 0.0–0.5)
Eosinophils Relative: 3 %
HCT: 32.8 % — ABNORMAL LOW (ref 36.0–46.0)
Hemoglobin: 10.2 g/dL — ABNORMAL LOW (ref 12.0–15.0)
Lymphocytes Relative: 15 %
Lymphs Abs: 1.2 10*3/uL (ref 0.7–4.0)
MCH: 31.9 pg (ref 26.0–34.0)
MCHC: 31.1 g/dL (ref 30.0–36.0)
MCV: 102.5 fL — ABNORMAL HIGH (ref 80.0–100.0)
Monocytes Absolute: 0.3 10*3/uL (ref 0.1–1.0)
Monocytes Relative: 4 %
Neutro Abs: 6.1 10*3/uL (ref 1.7–7.7)
Neutrophils Relative %: 76 %
Platelets: 402 10*3/uL — ABNORMAL HIGH (ref 150–400)
RBC: 3.2 MIL/uL — ABNORMAL LOW (ref 3.87–5.11)
RDW: 14 % (ref 11.5–15.5)
WBC: 8 10*3/uL (ref 4.0–10.5)
nRBC: 0 % (ref 0.0–0.2)
nRBC: 0 /100 WBC

## 2019-12-22 LAB — COMPREHENSIVE METABOLIC PANEL
ALT: 63 U/L — ABNORMAL HIGH (ref 0–44)
AST: 43 U/L — ABNORMAL HIGH (ref 15–41)
Albumin: 4.1 g/dL (ref 3.5–5.0)
Alkaline Phosphatase: 50 U/L (ref 38–126)
Anion gap: 8 (ref 5–15)
BUN: 25 mg/dL — ABNORMAL HIGH (ref 6–20)
CO2: 25 mmol/L (ref 22–32)
Calcium: 9.4 mg/dL (ref 8.9–10.3)
Chloride: 98 mmol/L (ref 98–111)
Creatinine, Ser: 1.58 mg/dL — ABNORMAL HIGH (ref 0.44–1.00)
GFR calc Af Amer: 41 mL/min — ABNORMAL LOW (ref >60)
GFR calc non Af Amer: 36 mL/min — ABNORMAL LOW (ref >60)
Glucose, Bld: 115 mg/dL — ABNORMAL HIGH (ref 70–99)
Potassium: 4.7 mmol/L (ref 3.5–5.1)
Sodium: 131 mmol/L — ABNORMAL LOW (ref 135–145)
Total Bilirubin: 0.5 mg/dL (ref 0.3–1.2)
Total Protein: 6.6 g/dL (ref 6.5–8.1)

## 2019-12-22 LAB — TYPE AND SCREEN
ABO/RH(D): O POS
Antibody Screen: NEGATIVE

## 2019-12-22 LAB — RAPID URINE DRUG SCREEN, HOSP PERFORMED
Amphetamines: NOT DETECTED
Barbiturates: NOT DETECTED
Benzodiazepines: POSITIVE — AB
Cocaine: NOT DETECTED
Opiates: NOT DETECTED
Tetrahydrocannabinol: NOT DETECTED

## 2019-12-22 LAB — RESPIRATORY PANEL BY RT PCR (FLU A&B, COVID)
Influenza A by PCR: NEGATIVE
Influenza B by PCR: NEGATIVE
SARS Coronavirus 2 by RT PCR: NEGATIVE

## 2019-12-22 LAB — PROTIME-INR
INR: 1.3 — ABNORMAL HIGH (ref 0.8–1.2)
Prothrombin Time: 15.3 seconds — ABNORMAL HIGH (ref 11.4–15.2)

## 2019-12-22 IMAGING — CT CT MAXILLOFACIAL W/O CM
3 series · 15 of 47 positions shown, 18 images · non-contrast
Comparison: [DATE]

CLINICAL DATA: Multiple falls, scalp laceration, Eliquis

EXAM:
CT HEAD WITHOUT CONTRAST
CT MAXILLOFACIAL WITHOUT CONTRAST
CT CERVICAL SPINE WITHOUT CONTRAST
TECHNIQUE: Multidetector CT imaging of the head, cervical spine, and
maxillofacial structures were performed using the standard protocol
without intravenous contrast. Multiplanar CT image reconstructions
of the cervical spine and maxillofacial structures were also
generated.

[Series 3: facialbone 2.0 st · axial · 0.39mm/px · z∈[+1337,+1481]mm · 9 of 84 slices shown, 12 images]
[im 6/84  brain]
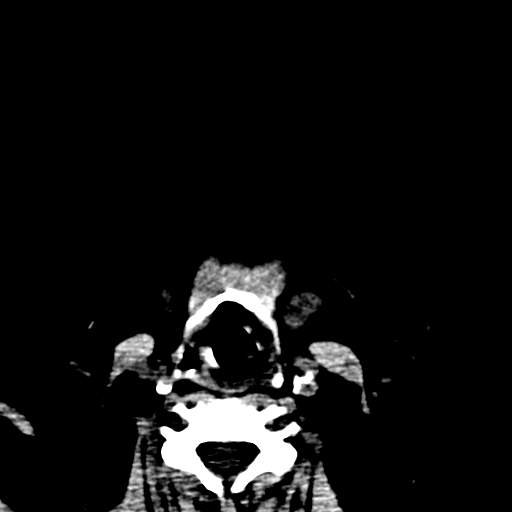
[im 6/84  bone]
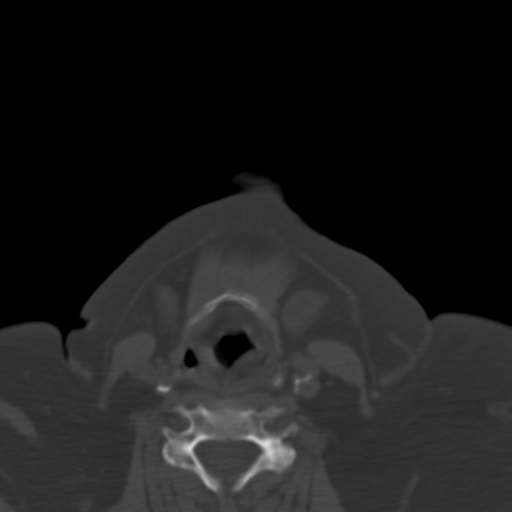
[im 15/84  bone]
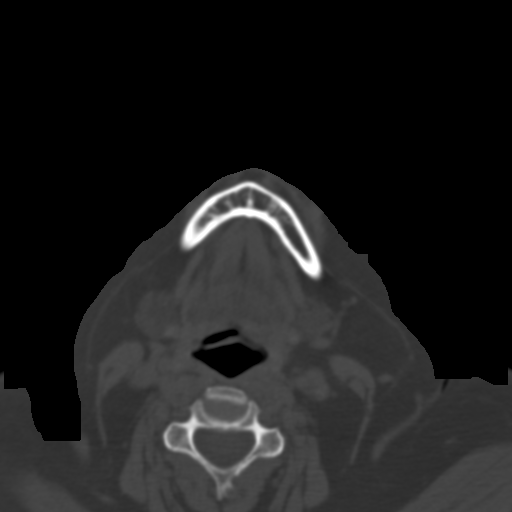
[im 23/84  bone]
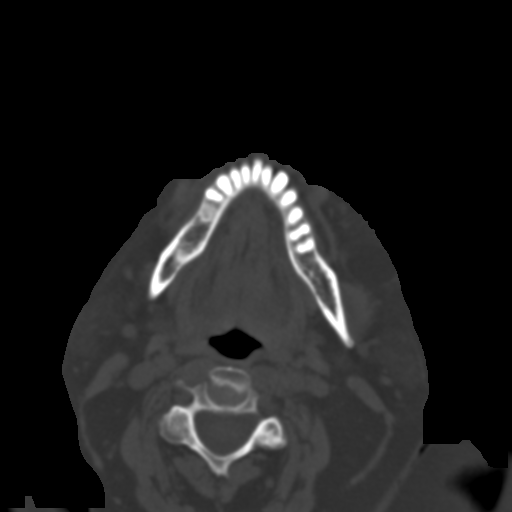
[im 32/84  bone]
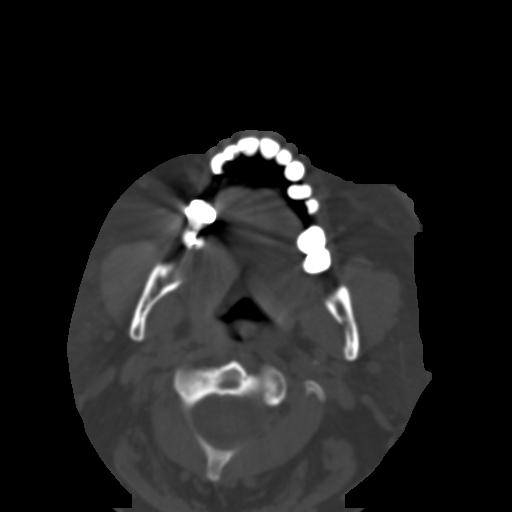
[im 43/84  brain]
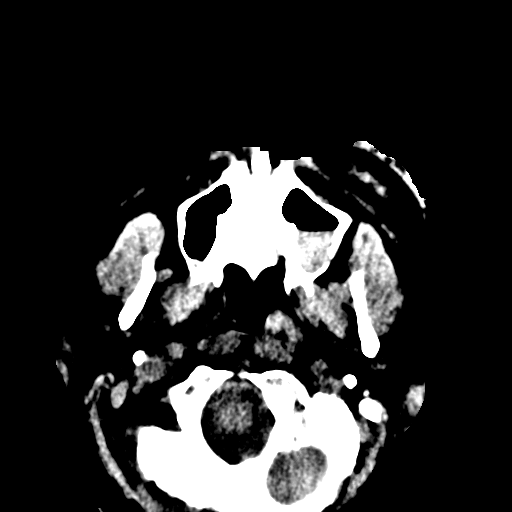
[im 43/84  bone]
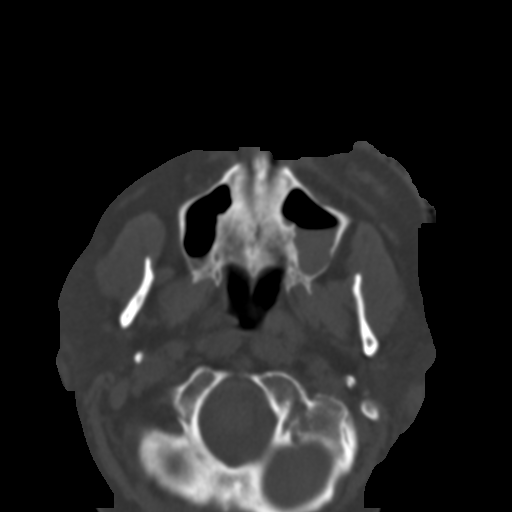
[im 52/84  bone]
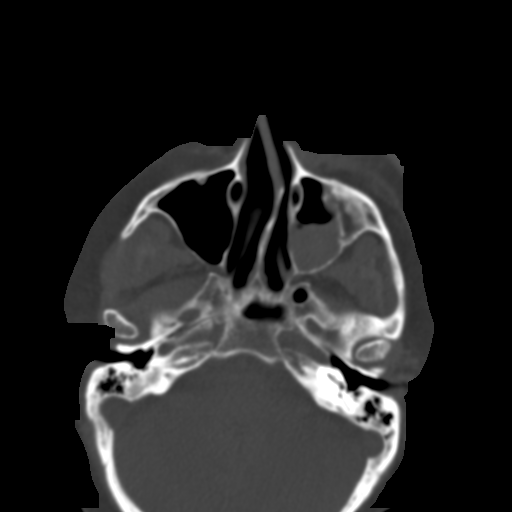
[im 61/84  bone]
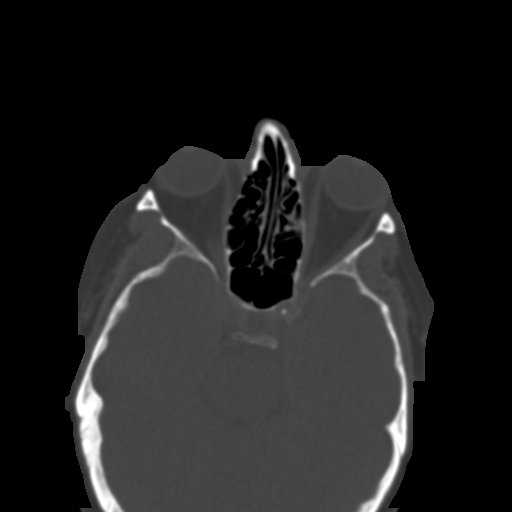
[im 69/84  bone]
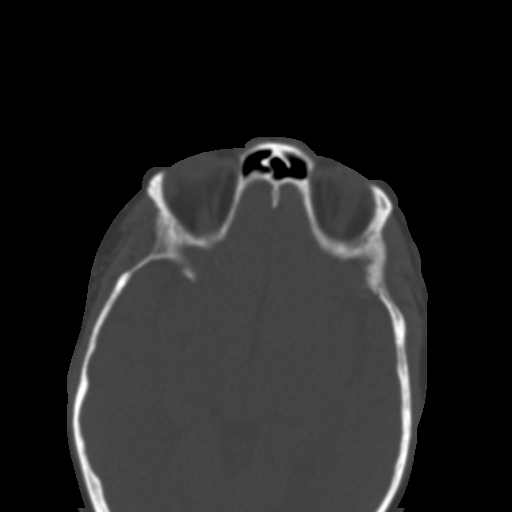
[im 78/84  brain]
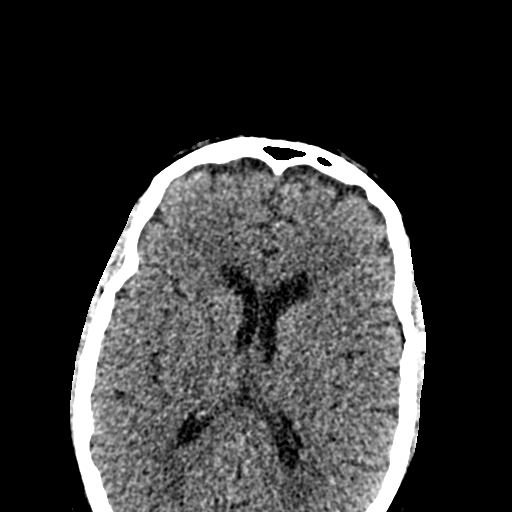
[im 78/84  bone]
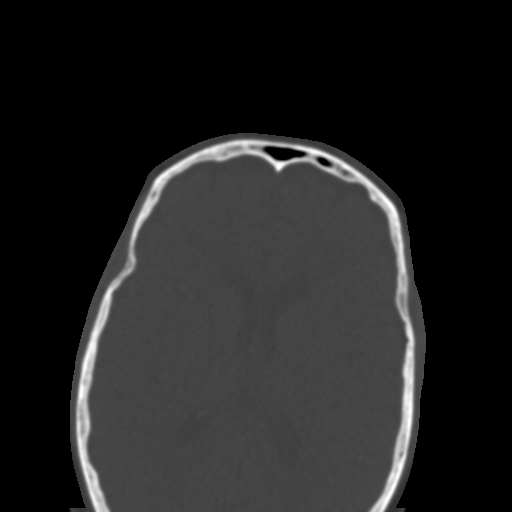

[Series 9: facialbone 2.0 cor st · coronal · 0.38mm/px · 3 of 79 slices shown]
[im 27/79  bone]
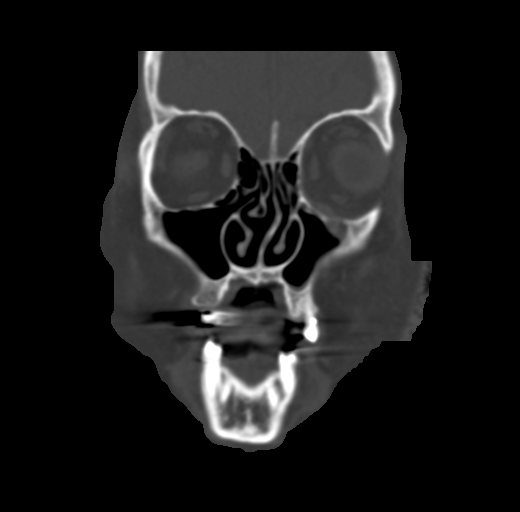
[im 35/79  bone]
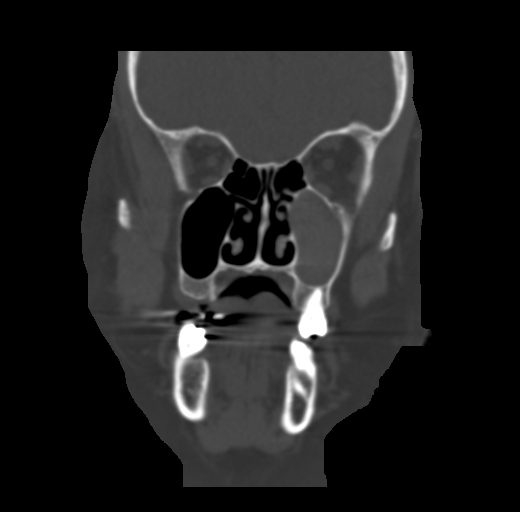
[im 44/79  bone]
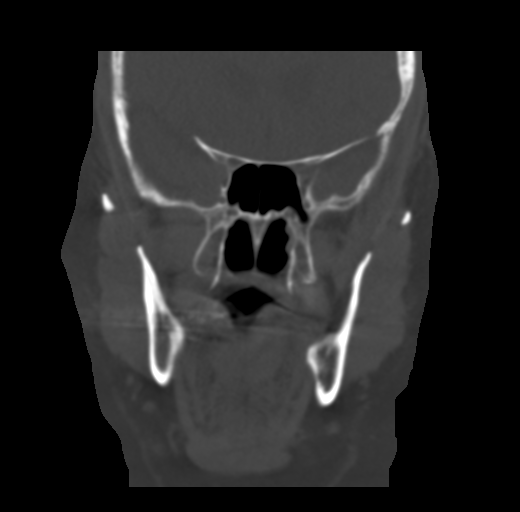

[Series 10: facialbone 2.0 sag st · sagittal · 0.35mm/px · 3 of 93 slices shown]
[im 31/93  bone]
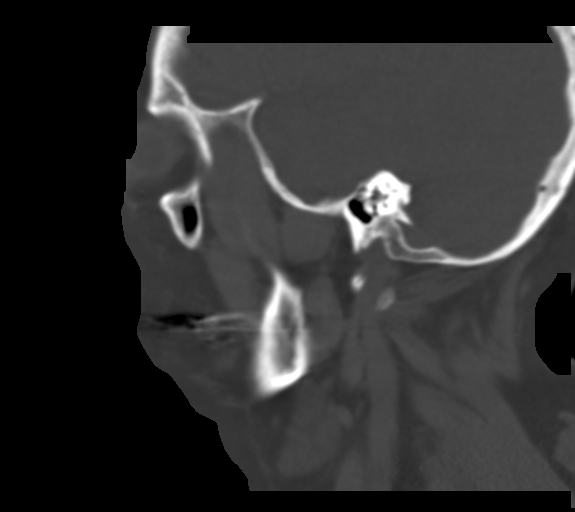
[im 47/93  bone]
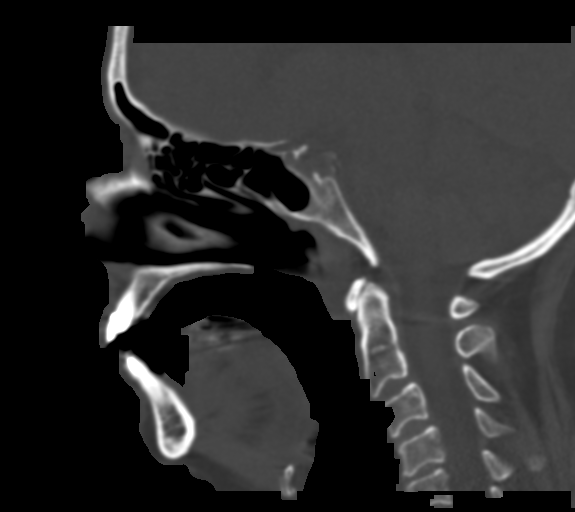
[im 62/93  bone]
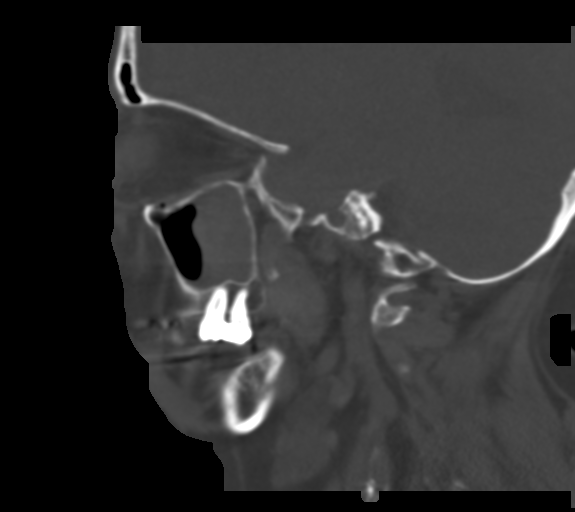

[15 of 47 positions shown; findings below may reference images not displayed]

FINDINGS: CT HEAD FINDINGS

Brain: No evidence of acute infarction, hemorrhage, hydrocephalus,
extra-axial collection or mass lesion/mass effect. Mild
periventricular white matter hypodensity.

Vascular: No hyperdense vessel or unexpected calcification.

CT FACIAL BONES FINDINGS

Skull: Normal. Negative for fracture or focal lesion.

Facial bones: No displaced fractures or dislocations of the facial
bones proper.

Sinuses/Orbits: There is a hyperdense fluid level within the left
maxillary sinus. Small, mildly displaced blowout fracture fragment
of the left orbital floor, fracture fragment measuring approximately
5 mm x 9 mm with 4 mm displacement. There is a small amount of fat
herniation without involvement of the inferior rectus.

Other: Soft tissue contusion and laceration of the left cheek and
eyelids. Right parietal scalp laceration.

CT CERVICAL SPINE FINDINGS

Alignment: Normal.

Skull base and vertebrae: No acute fracture. No primary bone lesion
or focal pathologic process.

Soft tissues and spinal canal: No prevertebral fluid or swelling. No
visible canal hematoma.

Disc levels:  Intact.

Upper chest: Negative.

Other: None.
IMPRESSION: 1.  No acute intracranial pathology.

2. Small, mildly displaced blowout fracture fragment of the left
orbital floor, fracture fragment measuring approximately 5 mm x 9 mm
with 4 mm displacement. There is a small amount of fat herniation
without involvement of the inferior rectus.

3. Soft tissue contusion and laceration of the left cheek and
eyelids.

4.  Right parietal scalp laceration.

5.  No fracture or static subluxation of the cervical spine.

## 2019-12-22 IMAGING — DX DG KNEE 1-2V*L*
1 series · 2 of 2 positions shown · non-contrast
Comparison: None.

CLINICAL DATA: Recent fall. Knee pain.

EXAM:
LEFT KNEE - 1-2 VIEW

[Series 1: knee · 0.14mm/px · 2 of 2 slices shown]
[im 1/2]
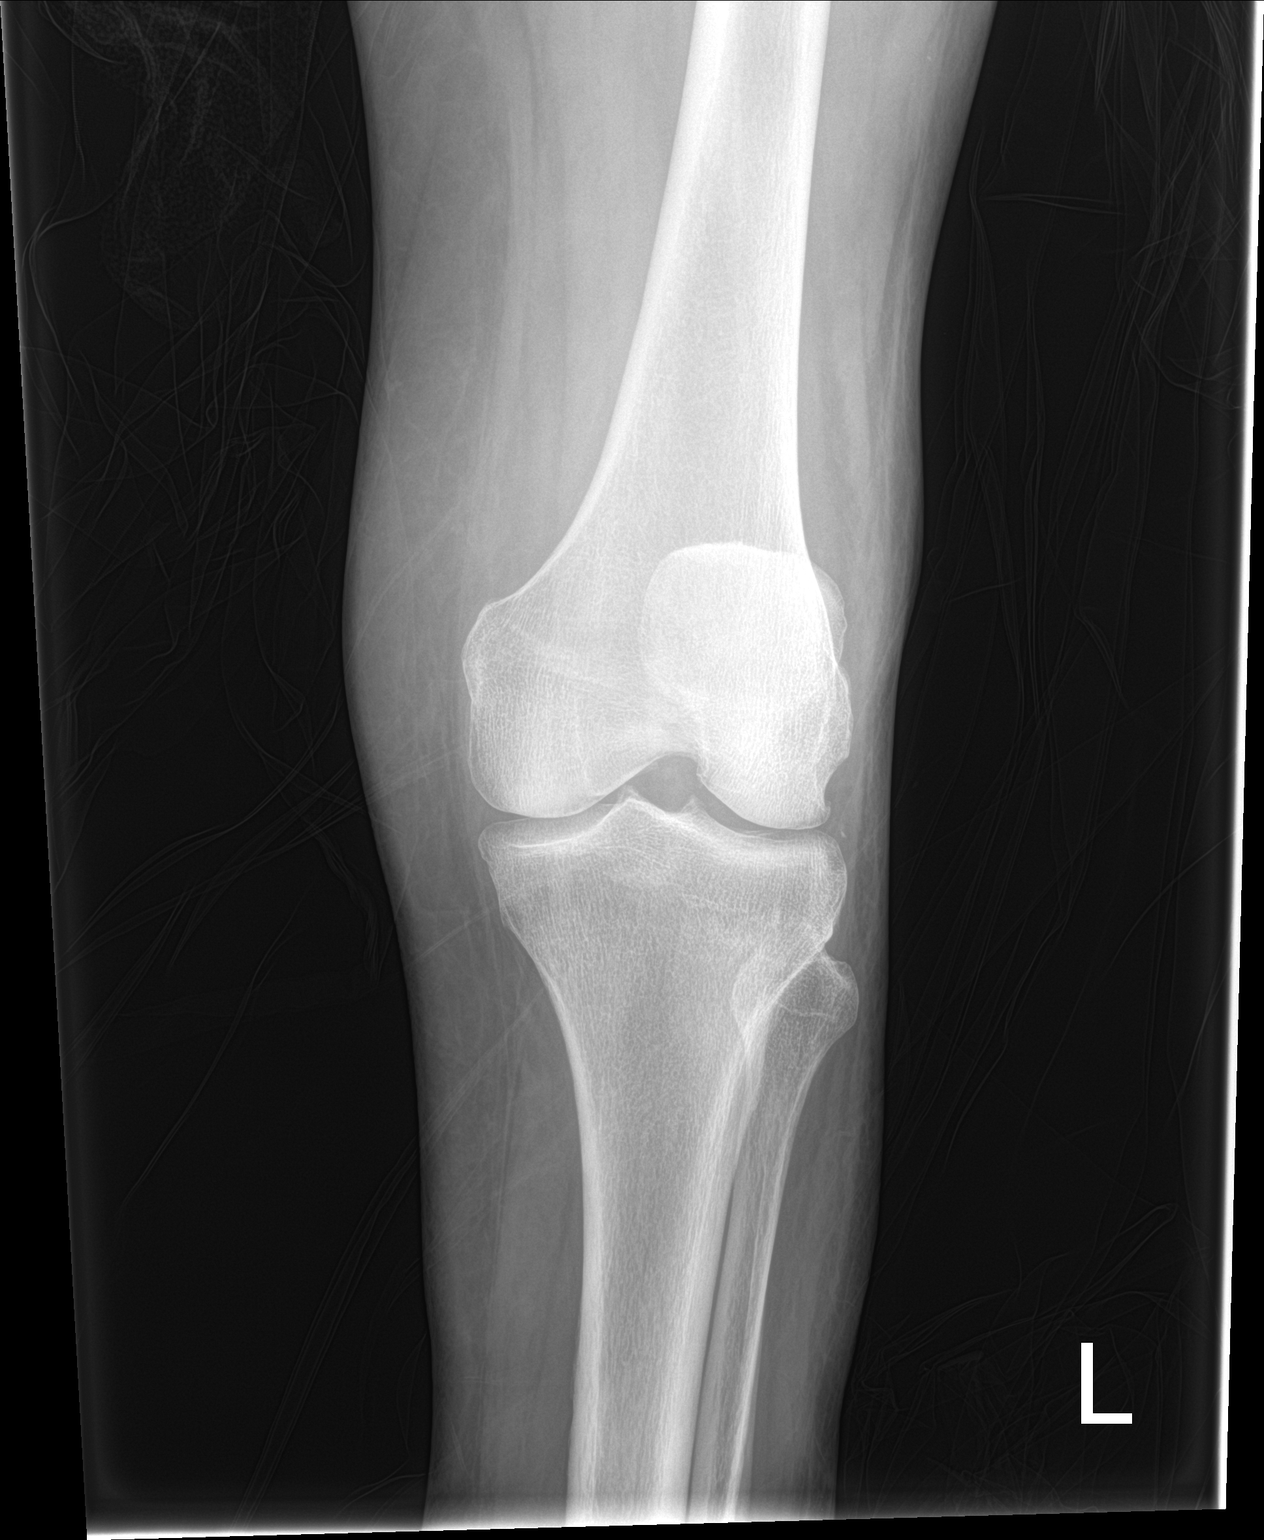
[im 2/2]
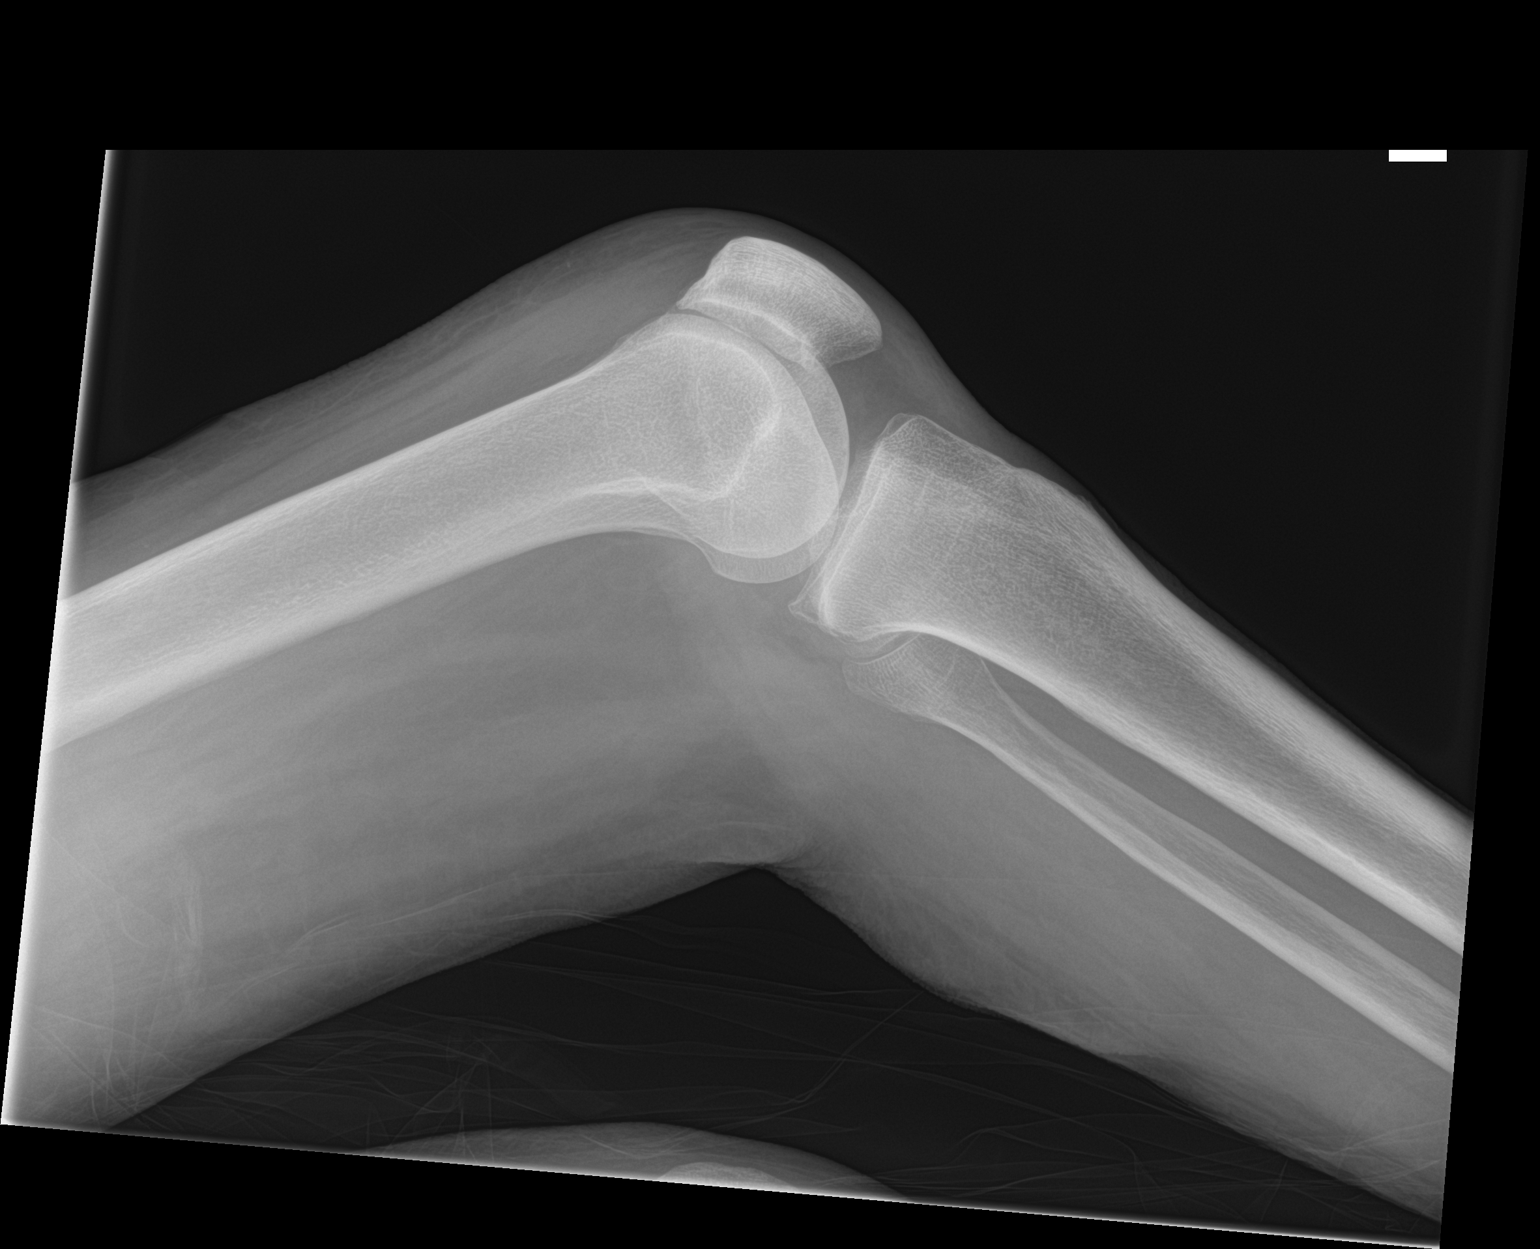

[2 of 2 positions shown; findings below may reference images not displayed]

FINDINGS: The joint spaces are fairly well maintained. Slight lateral joint
space narrowing and early spurring. Possible tiny avulsion fracture
involving the lateral tibia. Difficult to be certain because there
is significant clothing artifact.

Suspect suprapatellar knee joint effusion.
IMPRESSION: 1. Possible tiny avulsion fracture involving the lateral tibia.
2. Suspect suprapatellar knee joint effusion.
3. Mild lateral joint space narrowing and early spurring.

## 2019-12-22 IMAGING — DX DG CHEST 1V PORT
1 series · 1 of 1 positions shown · non-contrast
Comparison: [DATE]

CLINICAL DATA: Fall

EXAM:
PORTABLE CHEST 1 VIEW

[chest]
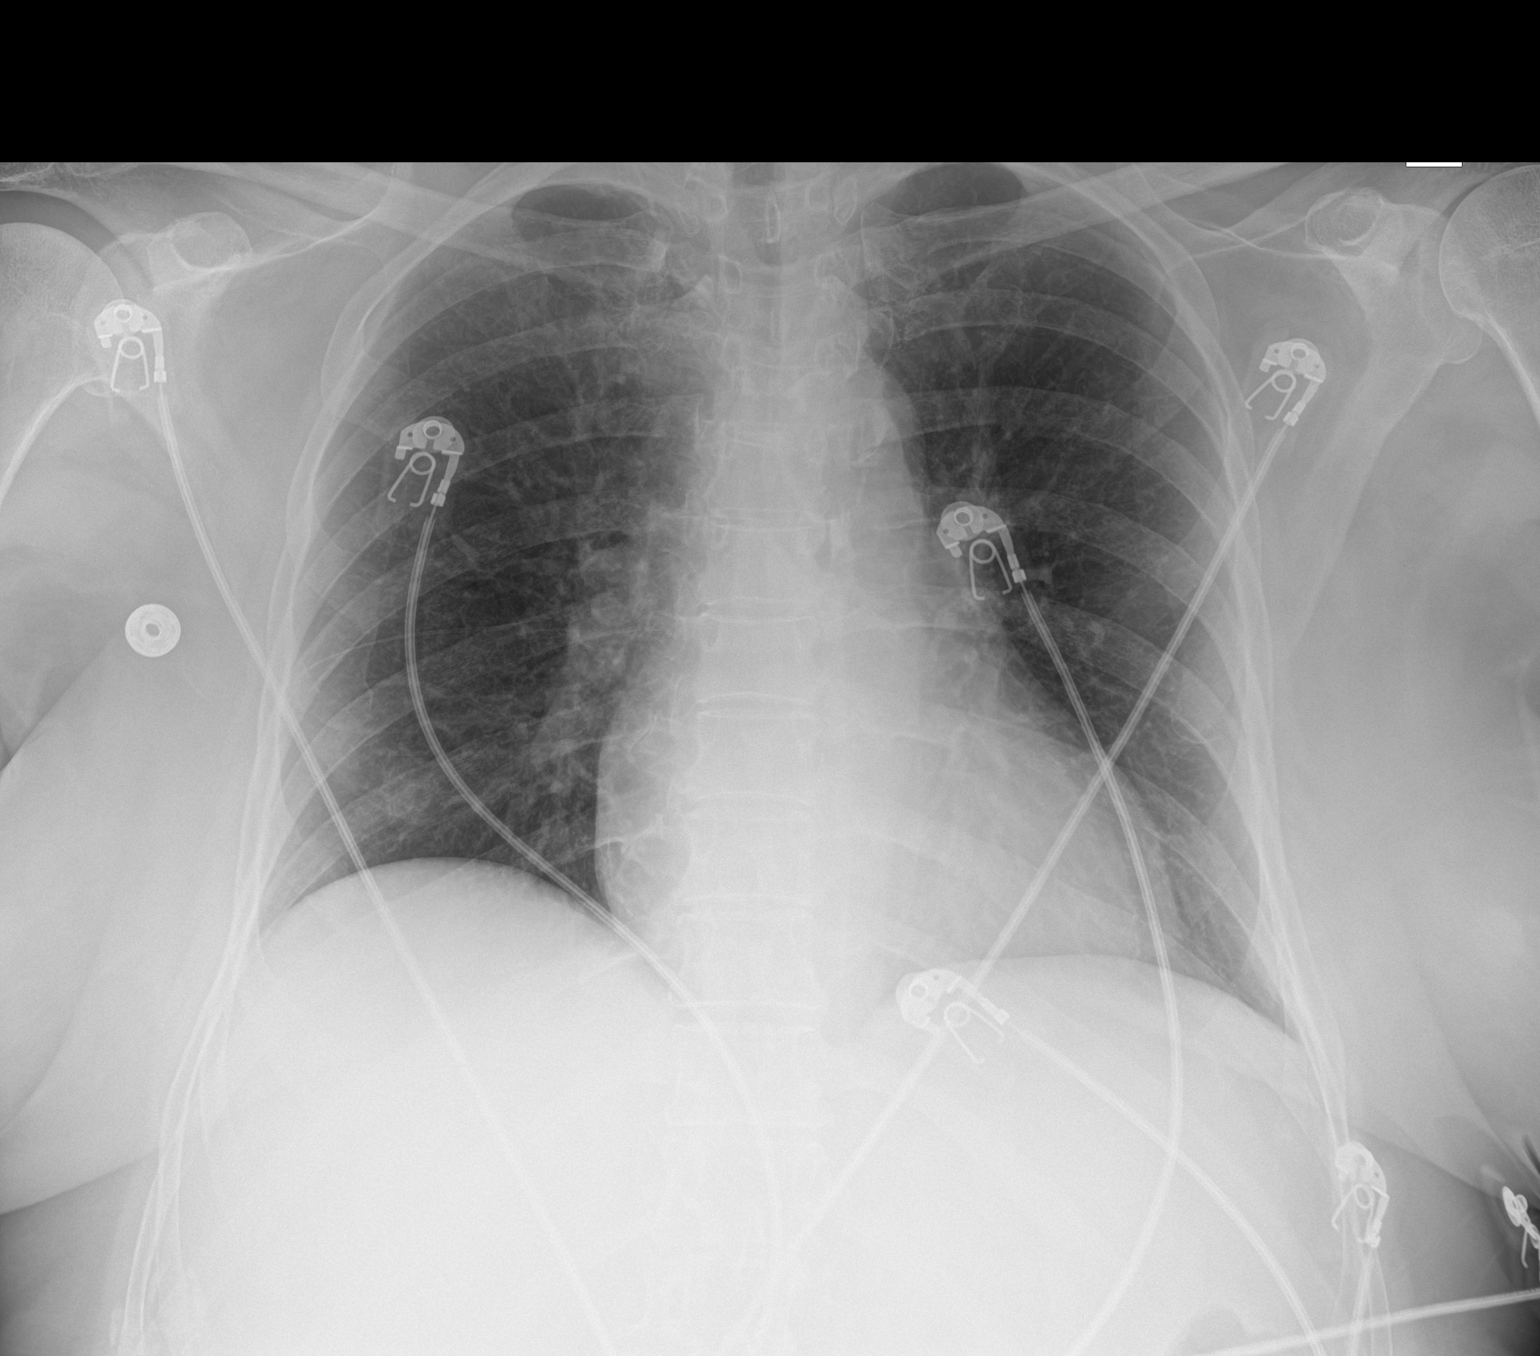

[1 of 1 positions shown; findings below may reference images not displayed]

FINDINGS: The heart size and mediastinal contours are within normal limits.
Aortic atherosclerosis. Both lungs are clear. The visualized
skeletal structures are unremarkable.
IMPRESSION: No active disease.

## 2019-12-22 IMAGING — DX DG PELVIS 1-2V
1 series · 2 of 2 positions shown · non-contrast
Comparison: [DATE]

CLINICAL DATA: Fall with bilateral hip pain

EXAM:
PELVIS - 1-2 VIEW

[Series 1: pelvis · 0.14mm/px · 2 of 2 slices shown]
[im 1/2]
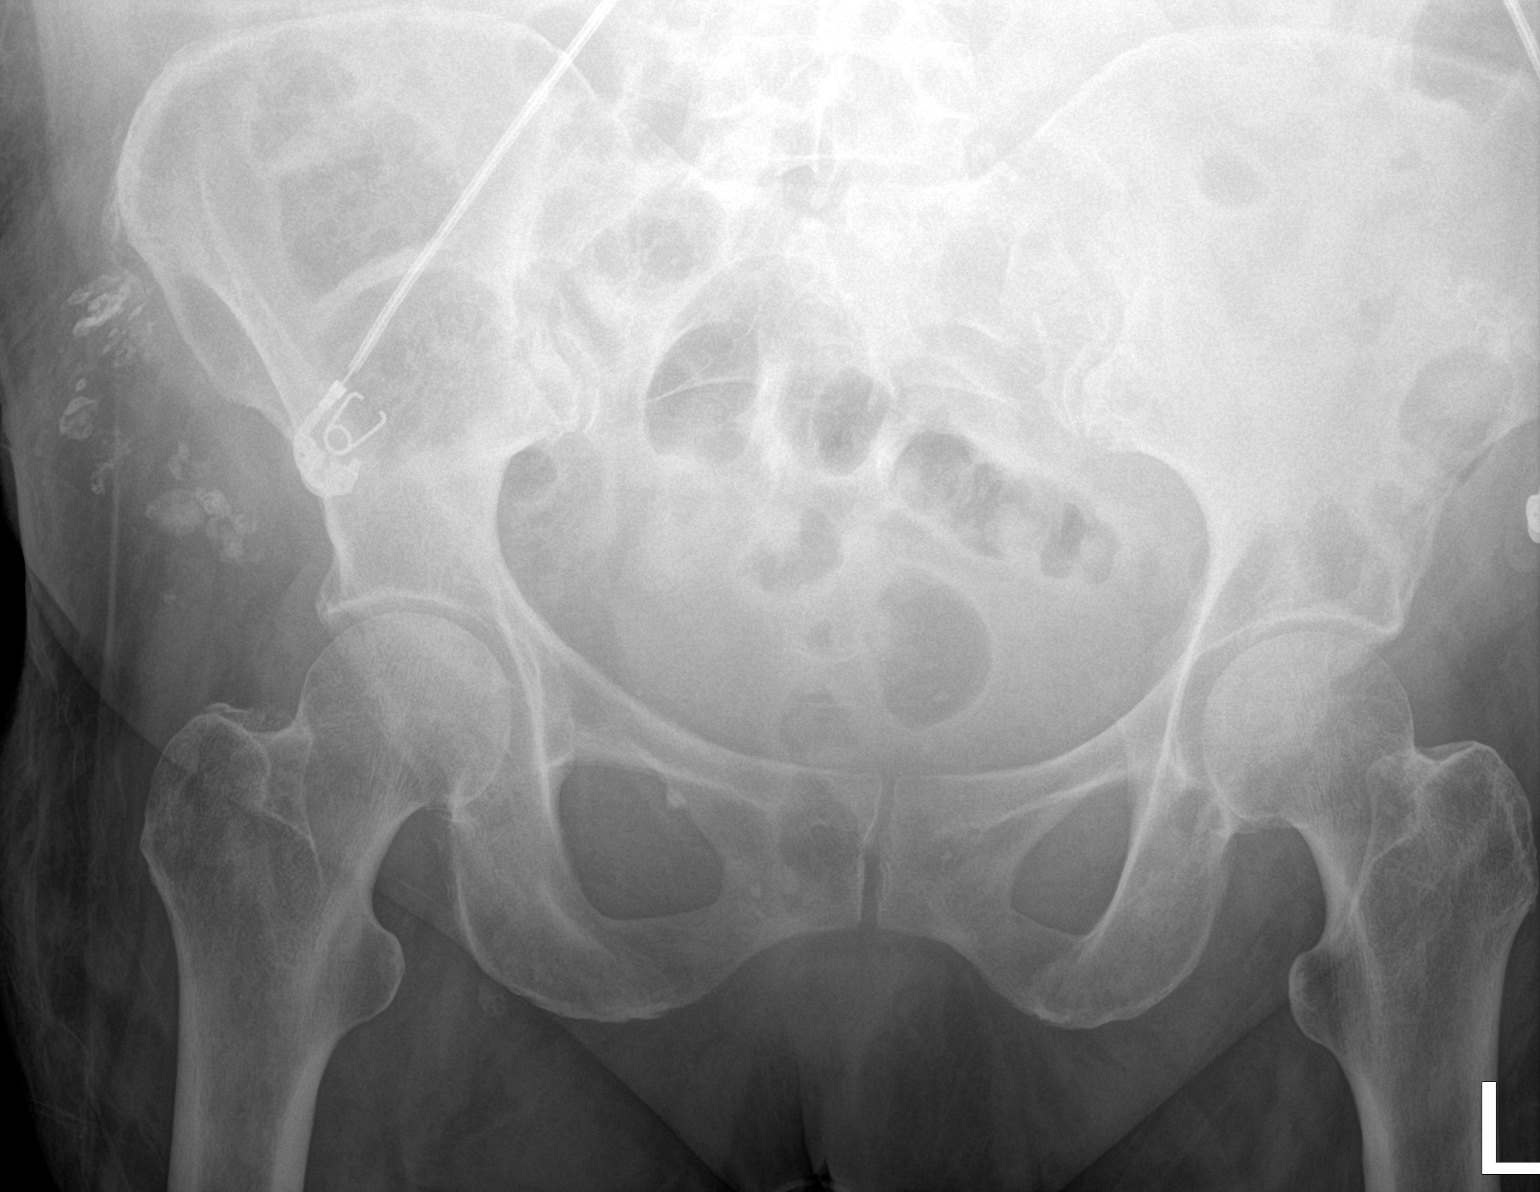
[im 2/2]
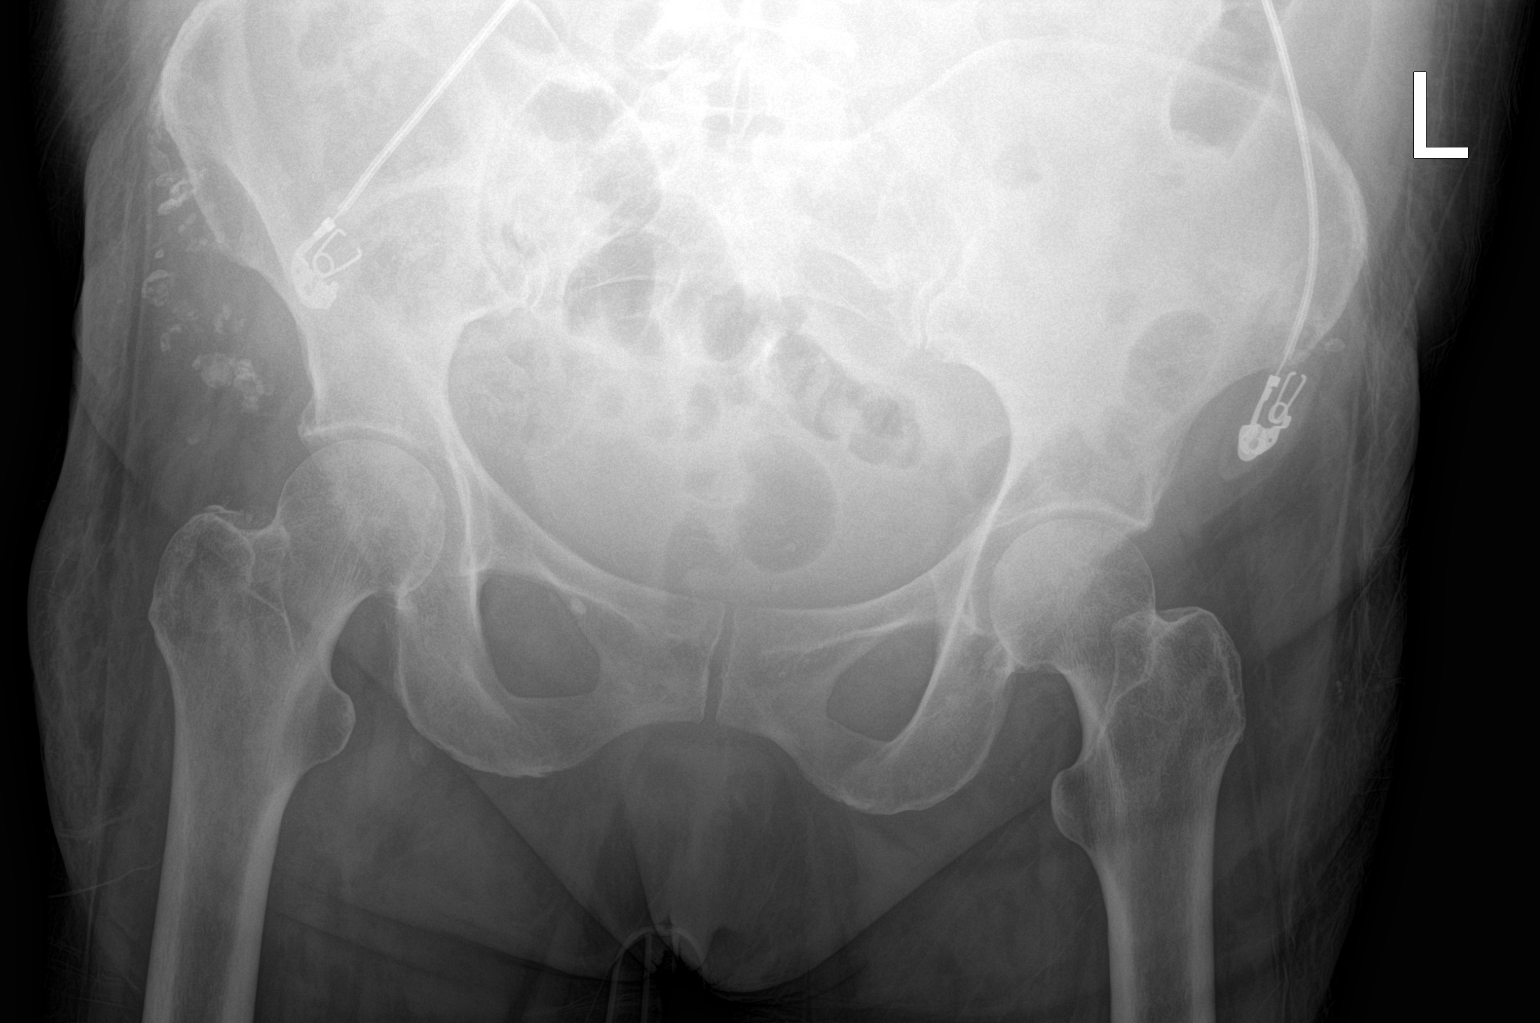

[2 of 2 positions shown; findings below may reference images not displayed]

FINDINGS: There is no evidence of pelvic fracture or diastasis. No pelvic bone
lesions are seen.
IMPRESSION: Negative.

## 2019-12-22 IMAGING — CT CT HEAD W/O CM
5 of 8 series · 16 of 47 positions shown, 17 images · non-contrast
Comparison: [DATE]

CLINICAL DATA: Multiple falls, scalp laceration, Eliquis

EXAM:
CT HEAD WITHOUT CONTRAST
CT MAXILLOFACIAL WITHOUT CONTRAST
CT CERVICAL SPINE WITHOUT CONTRAST
TECHNIQUE: Multidetector CT imaging of the head, cervical spine, and
maxillofacial structures were performed using the standard protocol
without intravenous contrast. Multiplanar CT image reconstructions
of the cervical spine and maxillofacial structures were also
generated.

[Series 3: head without · axial · non-contrast · 0.44mm/px · z∈[+1444,+1499]mm · 2 of 33 slices shown, 3 images]
[im 11/33  brain]
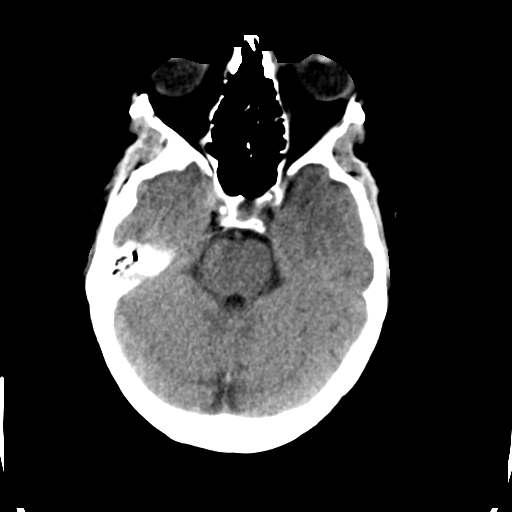
[im 11/33  bone]
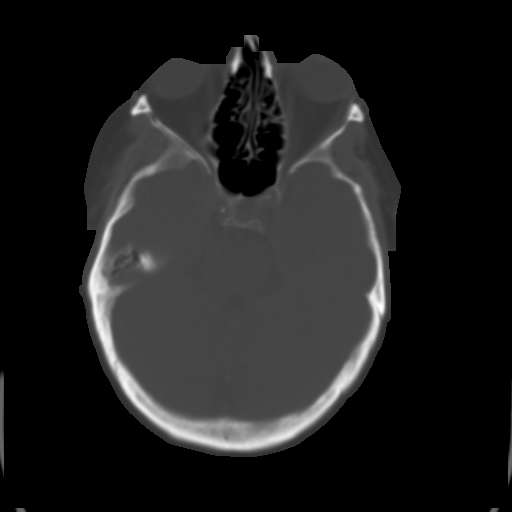
[im 22/33  brain]
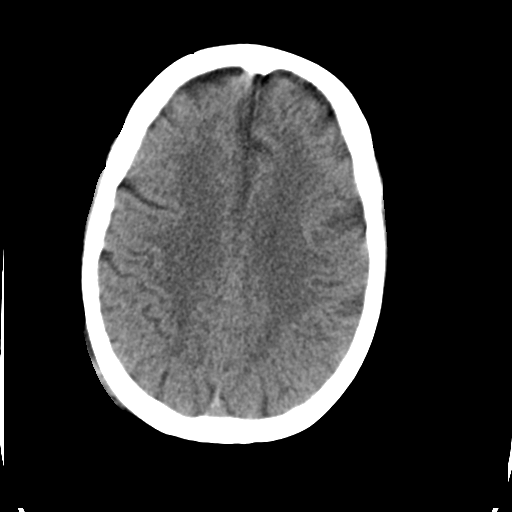

[Series 4: head bone · axial · 0.44mm/px · z∈[+1410,+1522]mm · 7 of 82 slices shown]
[im 9/82  bone]
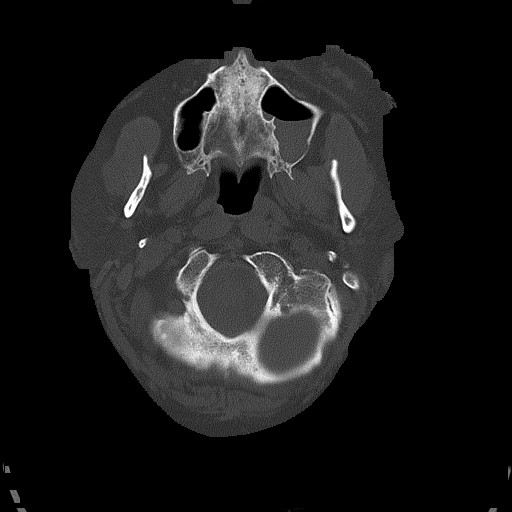
[im 17/82  bone]
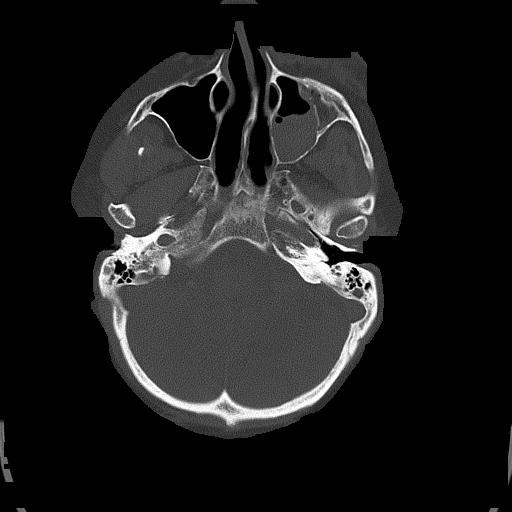
[im 25/82  bone]
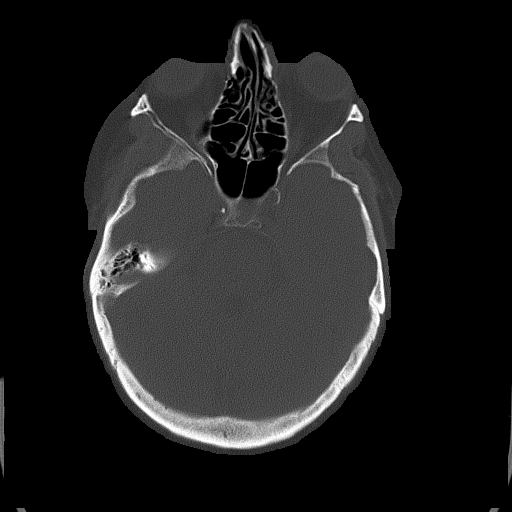
[im 33/82  bone]
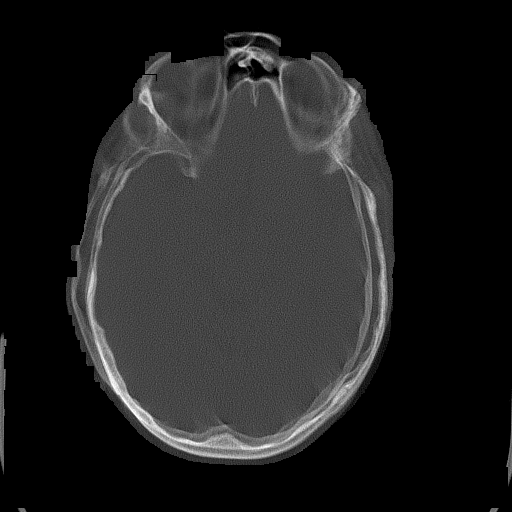
[im 49/82  bone]
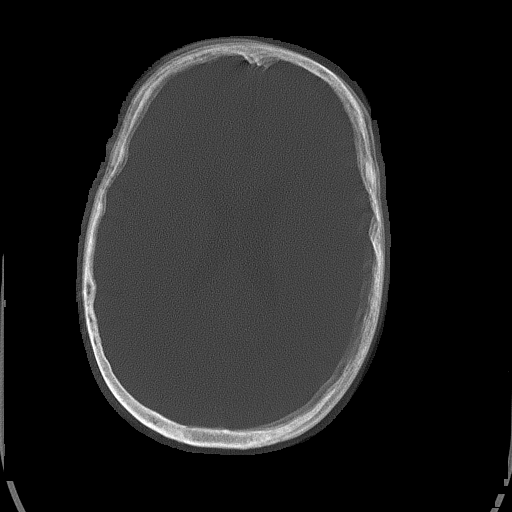
[im 57/82  bone]
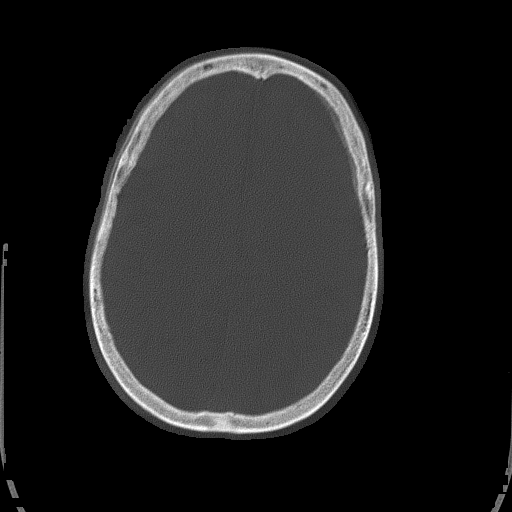
[im 65/82  bone]
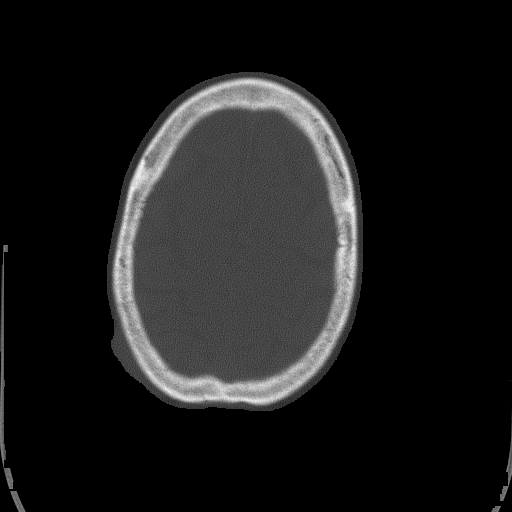

[Series 5: head without cor · coronal · non-contrast · 0.32mm/px · 3 of 67 slices shown]
[im 17/67  brain]
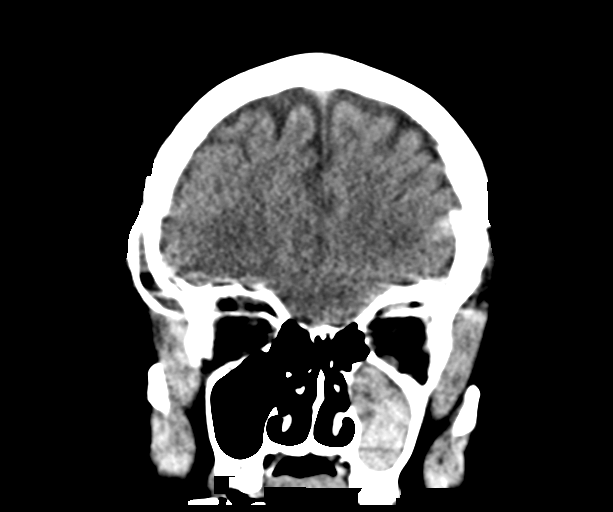
[im 34/67  brain]
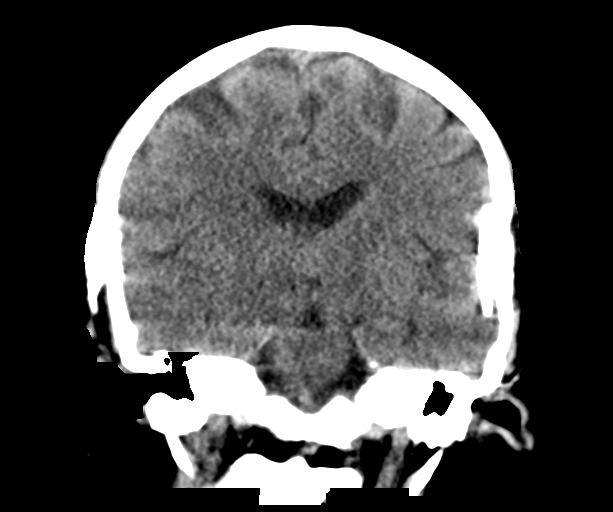
[im 50/67  brain]
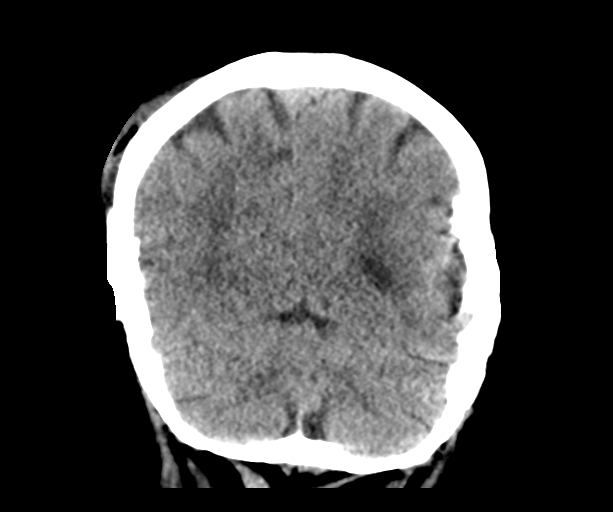

[Series 6: head without sag · sagittal · non-contrast · 0.35mm/px · 2 of 67 slices shown]
[im 23/67  brain]
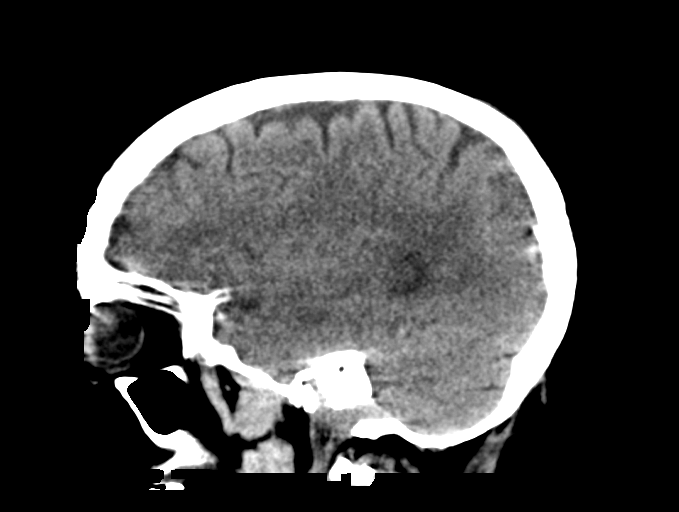
[im 45/67  brain]
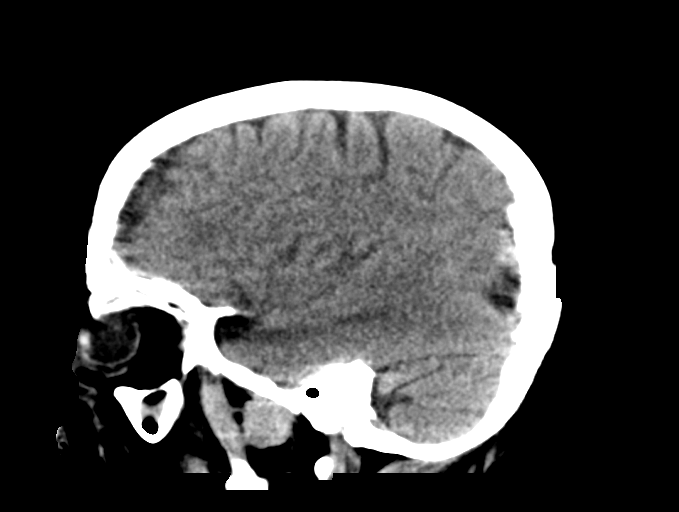

[Series 7: head without rp · axial · non-contrast · 0.44mm/px · z∈[+1444,+1499]mm · 2 of 33 slices shown]
[im 11/33  brain]
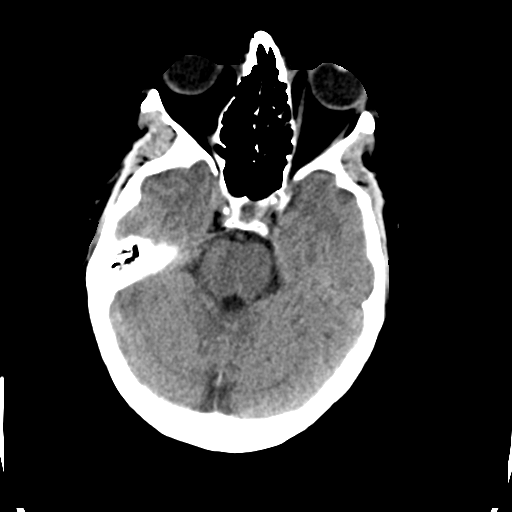
[im 22/33  brain]
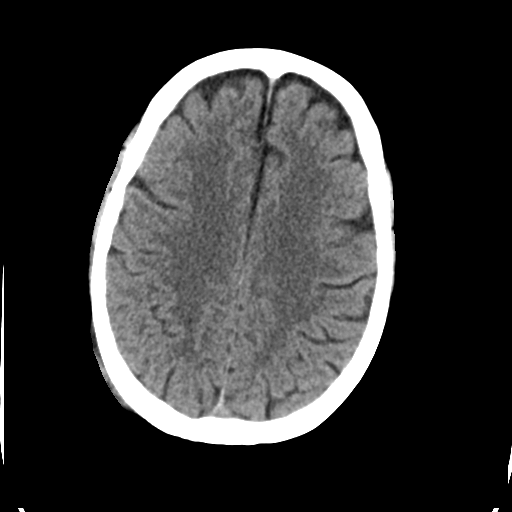

[16 of 47 positions shown; findings below may reference images not displayed]

FINDINGS: CT HEAD FINDINGS

Brain: No evidence of acute infarction, hemorrhage, hydrocephalus,
extra-axial collection or mass lesion/mass effect. Mild
periventricular white matter hypodensity.

Vascular: No hyperdense vessel or unexpected calcification.

CT FACIAL BONES FINDINGS

Skull: Normal. Negative for fracture or focal lesion.

Facial bones: No displaced fractures or dislocations of the facial
bones proper.

Sinuses/Orbits: There is a hyperdense fluid level within the left
maxillary sinus. Small, mildly displaced blowout fracture fragment
of the left orbital floor, fracture fragment measuring approximately
5 mm x 9 mm with 4 mm displacement. There is a small amount of fat
herniation without involvement of the inferior rectus.

Other: Soft tissue contusion and laceration of the left cheek and
eyelids. Right parietal scalp laceration.

CT CERVICAL SPINE FINDINGS

Alignment: Normal.

Skull base and vertebrae: No acute fracture. No primary bone lesion
or focal pathologic process.

Soft tissues and spinal canal: No prevertebral fluid or swelling. No
visible canal hematoma.

Disc levels:  Intact.

Upper chest: Negative.

Other: None.
IMPRESSION: 1.  No acute intracranial pathology.

2. Small, mildly displaced blowout fracture fragment of the left
orbital floor, fracture fragment measuring approximately 5 mm x 9 mm
with 4 mm displacement. There is a small amount of fat herniation
without involvement of the inferior rectus.

3. Soft tissue contusion and laceration of the left cheek and
eyelids.

4.  Right parietal scalp laceration.

5.  No fracture or static subluxation of the cervical spine.

## 2019-12-22 IMAGING — CT CT CERVICAL SPINE W/O CM
3 of 4 series · 12 of 33 positions shown, 14 images · non-contrast
Comparison: [DATE]

CLINICAL DATA: Multiple falls, scalp laceration, Eliquis

EXAM:
CT HEAD WITHOUT CONTRAST
CT MAXILLOFACIAL WITHOUT CONTRAST
CT CERVICAL SPINE WITHOUT CONTRAST
TECHNIQUE: Multidetector CT imaging of the head, cervical spine, and
maxillofacial structures were performed using the standard protocol
without intravenous contrast. Multiplanar CT image reconstructions
of the cervical spine and maxillofacial structures were also
generated.

[Series 4: c_spine 2.0 st · axial · 0.29mm/px · z∈[+1291,+1415]mm · 4 of 94 slices shown, 5 images]
[im 16/94  soft-tissue]
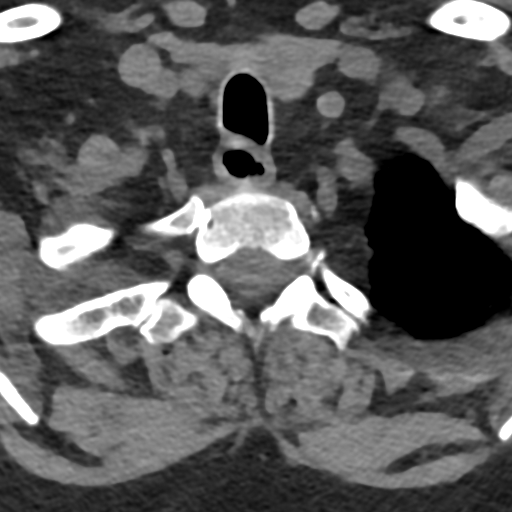
[im 16/94  bone]
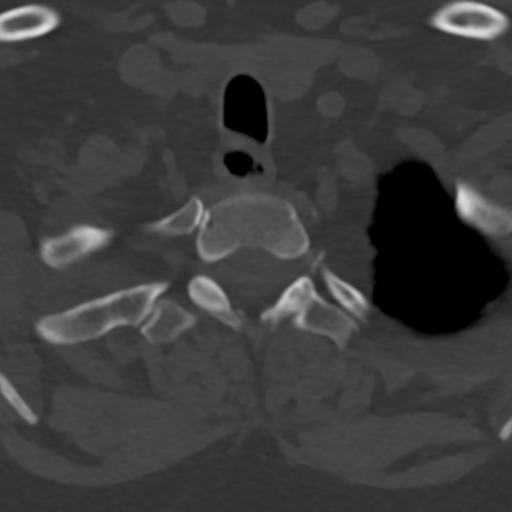
[im 32/94  bone]
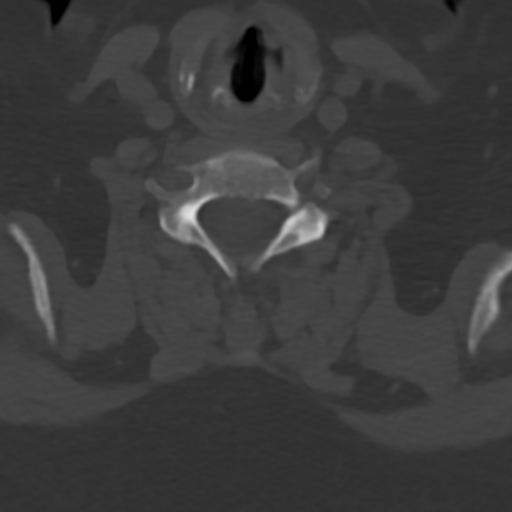
[im 63/94  bone]
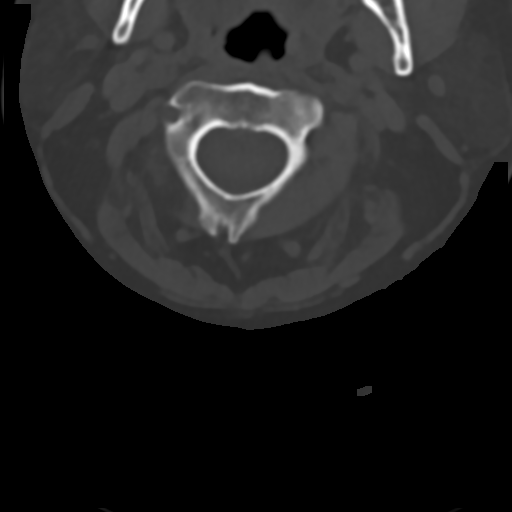
[im 78/94  bone]
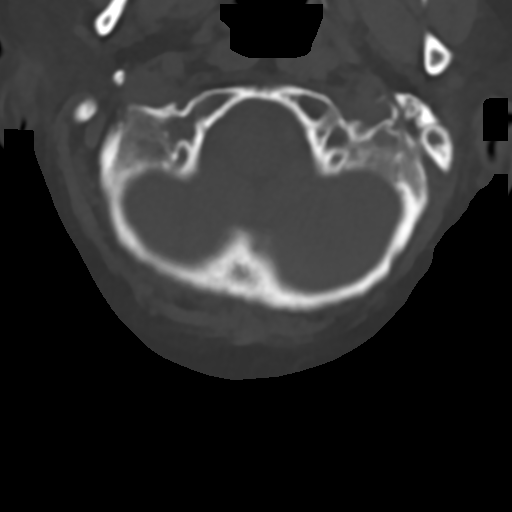

[Series 9: c_spine 2.0 sag bone · sagittal · 0.28mm/px · 5 of 61 slices shown, 6 images]
[im 21/61  bone]
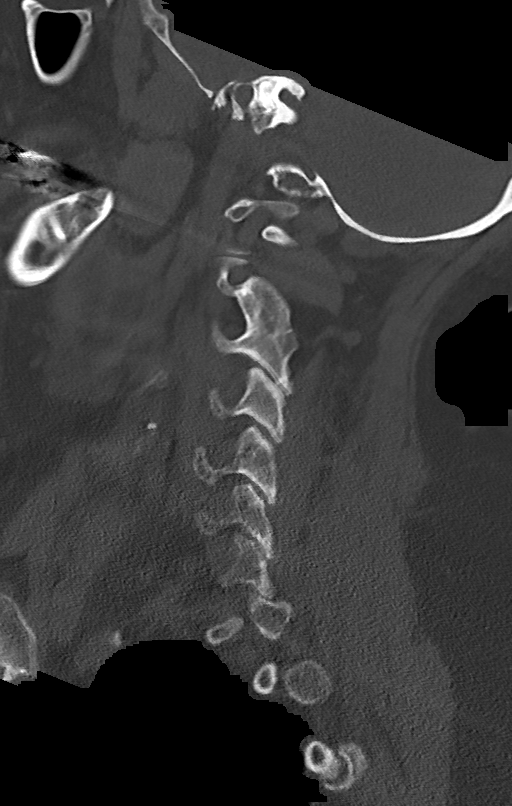
[im 26/61  bone]
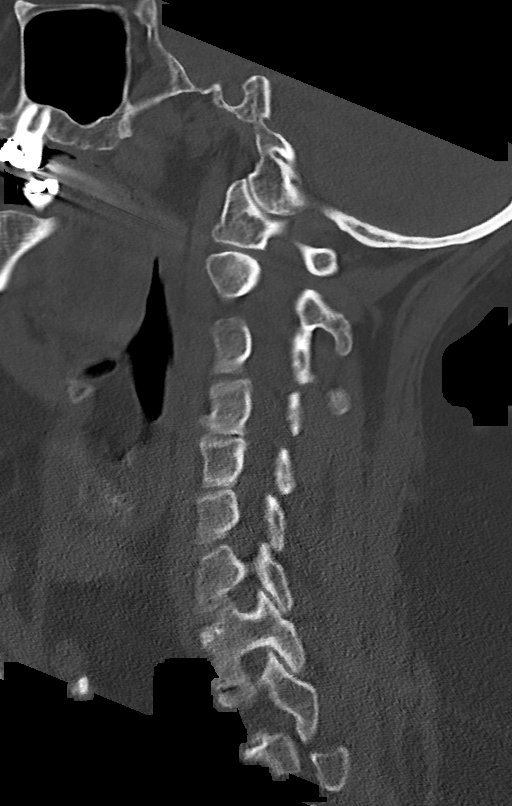
[im 31/61  soft-tissue]
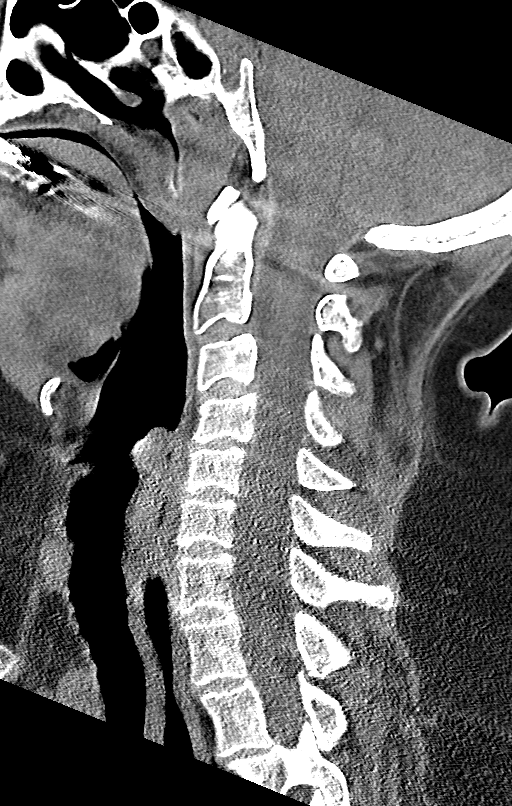
[im 31/61  bone]
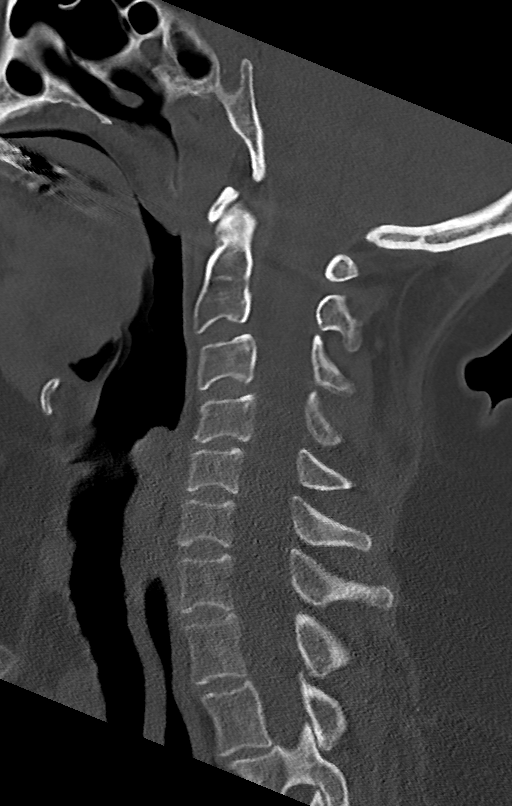
[im 36/61  bone]
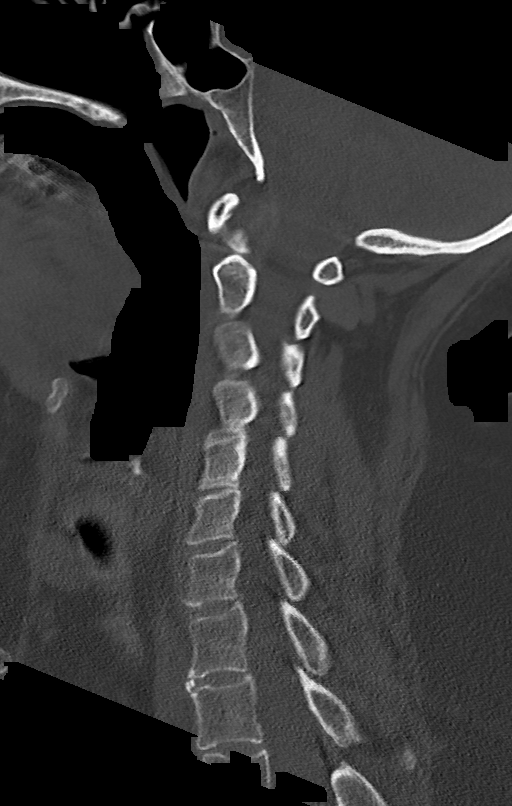
[im 41/61  bone]
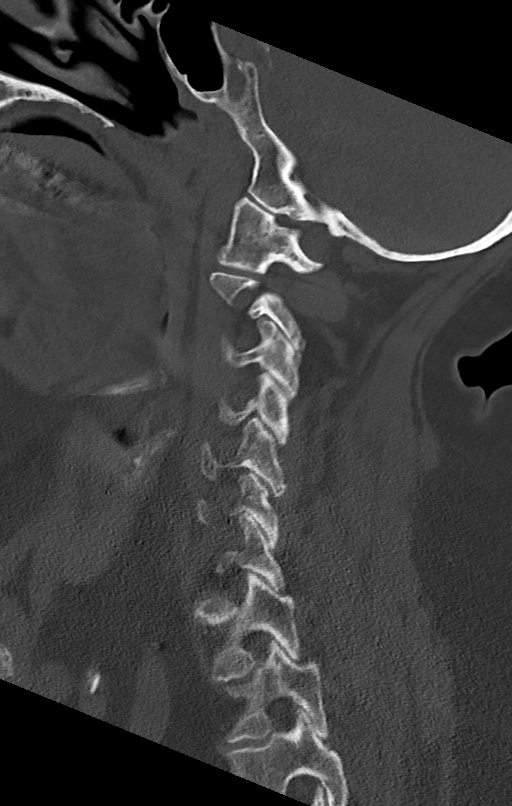

[Series 10: c_spine 2.0 cor bone · coronal · 0.29mm/px · 3 of 61 slices shown]
[im 13/61  bone]
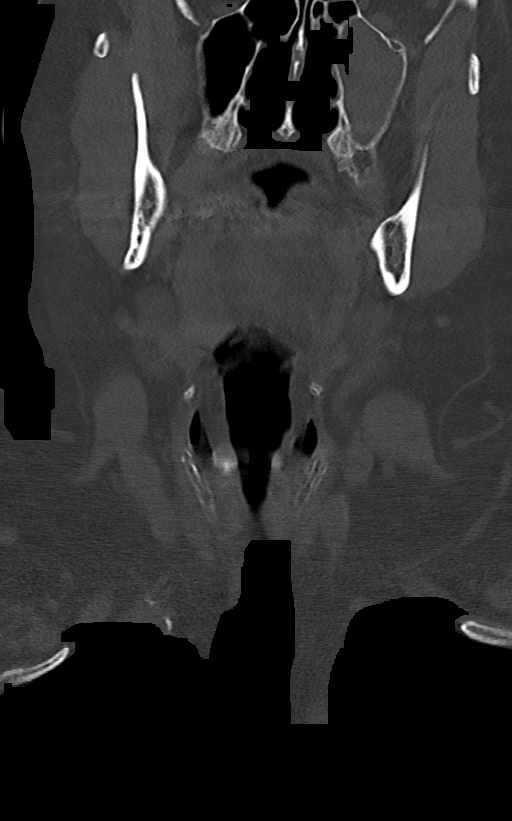
[im 25/61  bone]
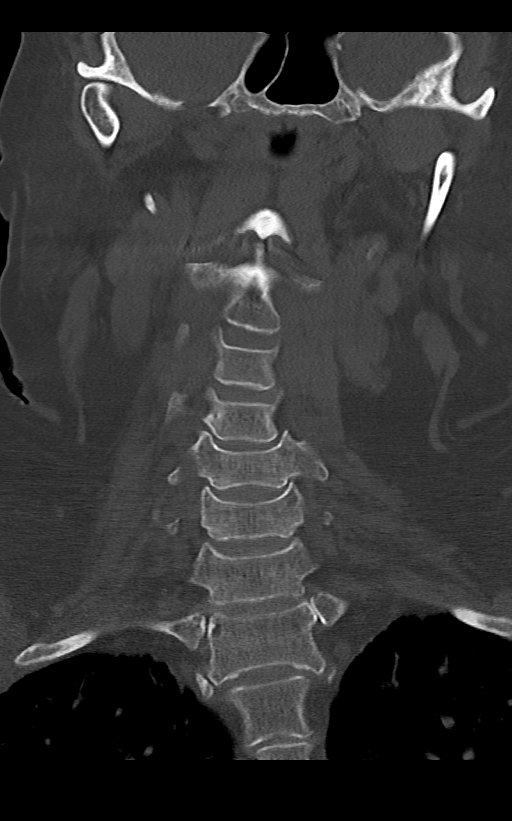
[im 37/61  bone]
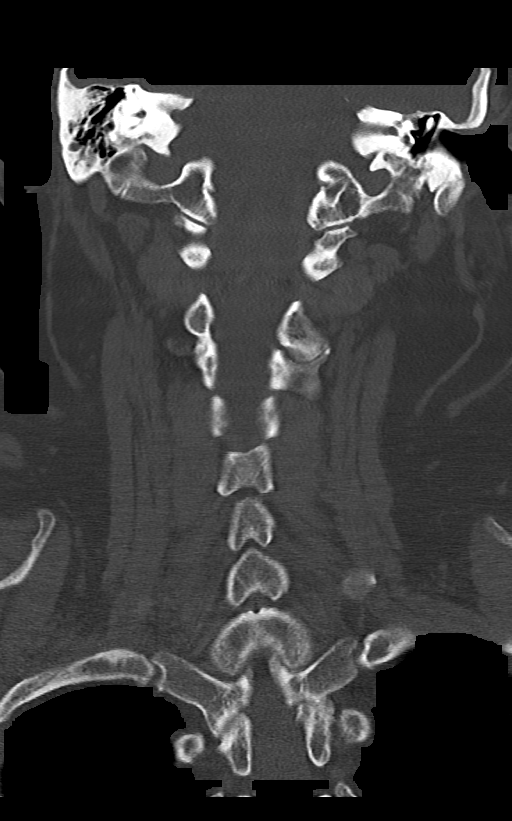

[12 of 33 positions shown; findings below may reference images not displayed]

FINDINGS: CT HEAD FINDINGS

Brain: No evidence of acute infarction, hemorrhage, hydrocephalus,
extra-axial collection or mass lesion/mass effect. Mild
periventricular white matter hypodensity.

Vascular: No hyperdense vessel or unexpected calcification.

CT FACIAL BONES FINDINGS

Skull: Normal. Negative for fracture or focal lesion.

Facial bones: No displaced fractures or dislocations of the facial
bones proper.

Sinuses/Orbits: There is a hyperdense fluid level within the left
maxillary sinus. Small, mildly displaced blowout fracture fragment
of the left orbital floor, fracture fragment measuring approximately
5 mm x 9 mm with 4 mm displacement. There is a small amount of fat
herniation without involvement of the inferior rectus.

Other: Soft tissue contusion and laceration of the left cheek and
eyelids. Right parietal scalp laceration.

CT CERVICAL SPINE FINDINGS

Alignment: Normal.

Skull base and vertebrae: No acute fracture. No primary bone lesion
or focal pathologic process.

Soft tissues and spinal canal: No prevertebral fluid or swelling. No
visible canal hematoma.

Disc levels:  Intact.

Upper chest: Negative.

Other: None.
IMPRESSION: 1.  No acute intracranial pathology.

2. Small, mildly displaced blowout fracture fragment of the left
orbital floor, fracture fragment measuring approximately 5 mm x 9 mm
with 4 mm displacement. There is a small amount of fat herniation
without involvement of the inferior rectus.

3. Soft tissue contusion and laceration of the left cheek and
eyelids.

4.  Right parietal scalp laceration.

5.  No fracture or static subluxation of the cervical spine.

## 2019-12-22 NOTE — H&P (Signed)
History and Physical    Brianna Porter [id number] DOB: 06-09-1961 DOA: 12/22/2019  PCP: Cher Nakai, MD  Patient coming from: home  I have personally briefly reviewed patient's old medical records in [place]  Chief Complaint: Falls  HPI: Brianna Porter is a 59 y.o. female with medical history significant of systolic heart failure, CKD, A. fib/flutter on apixaban, hyperthyroidism, gastric ulcers, and CAD who presents after 2 ground-level falls within 24 hours.  At this hour patient is somewhat sleepy and difficult historian due to time and bandages on face.  She does eventually answer repeated questions and tells me she had 2 falls in the past day due to imbalance.  She states she did not trip over anything.  She states she should use a walker but both of her falls occurred while not using walker.  She reports prior to the last 24 hours she has not had falls recently.  She states with her falls she did hit her head on time falling backwards at the other time forwards.  She denies any syncope or dizziness/lightheadedness.  She just felt unsteady on her feet.  She denies any preceding chest pain or palpitations.  She has been taking apixaban.  At this time she tells me she is sleepy but denies any acute pain.  She denies any fevers or chills.  No dysuria, frequency.  She lives at home with her daughter.  ED Course: Extensive bleeding particularly from left cheek abrasion.  Hemostasis achieved by ER physician.  Review of Systems: As per HPI otherwise 10 point review of systems negative.    Past Medical History:  Diagnosis Date  . Anxiety and depression   . Atrial flutter ([place])   . Chronic systolic CHF (congestive heart failure) (HCC)    a. dx in setting of atrial fib/flutter - possibly tachy mediated. Coronary CTA with only mild CAD in 04/2019.  Marland Kitchen CKD (chronic kidney disease), stage II   . Confusion    a. persistent confusion during 04/2019 admission of unclear cause. Home meds  adjusted. CT/MRI brain nonacute.  . Diabetes ([place])   . Edema   . Elevated liver function tests   . Hypercholesteremia   . Hyperkalemia   . Hypertension   . Hypertensive heart and chronic kidney disease with systolic congestive heart failure ([place])   . Hyperthyroidism   . Hypokalemia   . Hypokalemia   . Hypomagnesemia   . Hyponatremia   . Iron deficiency anemia   . Mild CAD    a. Coronary CT 04/2019 - Minimal, Non-obstructive CAD.  Marland Kitchen NICM (nonischemic cardiomyopathy) ([place])   . Persistent atrial fibrillation ([place])   . Prolonged QT interval     Past Surgical History:  Procedure Laterality Date  . BIOPSY  06/29/2019   Procedure: BIOPSY;  Surgeon: Irene Shipper, MD;  Location: [place];  Service: Endoscopy;;  . CARDIOVERSION N/A 04/29/2019   Procedure: CARDIOVERSION;  Surgeon: Sanda Klein, MD;  Location: [place];  Service: Cardiovascular;  Laterality: N/A;  . CARDIOVERSION N/A 05/04/2019   Procedure: CARDIOVERSION;  Surgeon: Josue Hector, MD;  Location: [place];  Service: Cardiovascular;  Laterality: N/A;  . COLONOSCOPY WITH PROPOFOL N/A 07/12/2019   Procedure: COLONOSCOPY WITH PROPOFOL;  Surgeon: Doran Stabler, MD;  Location: [place];  Service: Gastroenterology;  Laterality: N/A;  . ENTEROSCOPY N/A 07/10/2019   Procedure: ENTEROSCOPY;  Surgeon: Doran Stabler, MD;  Location: [place];  Service: Gastroenterology;  Laterality: N/A;  . ESOPHAGOGASTRODUODENOSCOPY  06/29/2019  . ESOPHAGOGASTRODUODENOSCOPY (EGD) WITH PROPOFOL N/A 06/29/2019   Procedure: ESOPHAGOGASTRODUODENOSCOPY (EGD) WITH PROPOFOL;  Surgeon: Irene Shipper, MD;  Location[facility] ENDOSCOPY;  Service: Endoscopy;  Laterality: N/A;  . GIVENS CAPSULE STUDY N/A 07/12/2019   Procedure: GIVENS CAPSULE STUDY;  Surgeon: Doran Stabler, MD;  Location: [place];  Service: Gastroenterology;  Laterality: N/A;  . HEMOSTASIS CLIP PLACEMENT  07/12/2019   Procedure: HEMOSTASIS CLIP PLACEMENT;  Surgeon:  Doran Stabler, MD;  Location: [place];  Service: Gastroenterology;;  . POLYPECTOMY  07/12/2019   Procedure: POLYPECTOMY;  Surgeon: Doran Stabler, MD;  Location: [place];  Service: Gastroenterology;;  . SMALL BOWEL ENTEROSCOPY  07/10/2019  . SUBMUCOSAL TATTOO INJECTION  07/10/2019   Procedure: SUBMUCOSAL TATTOO INJECTION;  Surgeon: Doran Stabler, MD;  Location: [facility] ENDOSCOPY;  Service: Gastroenterology;;  . TEE WITHOUT CARDIOVERSION  04/29/2019  . TEE WITHOUT CARDIOVERSION N/A 04/29/2019   Procedure: TRANSESOPHAGEAL ECHOCARDIOGRAM (TEE);  Surgeon: Sanda Klein, MD;  Location: [facility] ENDOSCOPY;  Service: Cardiovascular;  Laterality: N/A;  . TUBAL LIGATION       reports that she quit smoking about 20 years ago. Her smoking use included cigarettes. She has a 60.00 pack-year smoking history. She has never used smokeless tobacco. She reports that she does not drink alcohol or use drugs.  Allergies  Allergen Reactions  . Ambien [Zolpidem Tartrate]     Pt and family state this med makes the patient sleepwalk.  . Adhesive [Tape]     Tears skin, please use paper tape  . Codeine Nausea And Vomiting    Family History  Problem Relation Age of Onset  . Cancer Mother   . Heart disease Mother   . Hypercholesterolemia Mother   . Osteoarthritis Mother   . Stroke Mother   . Heart disease Father   . Hypercholesterolemia Brother      Prior to Admission medications   Medication Sig Start Date End Date Taking? Authorizing Provider  acetaminophen (TYLENOL) 325 MG tablet Take 650 mg by mouth every 6 (six) hours as needed for mild pain.   Yes [provider]  albuterol (VENTOLIN HFA) 108 (90 Base) MCG/ACT inhaler Inhale 2 puffs into the lungs every 6 (six) hours as needed for wheezing or shortness of breath.   Yes [provider]  ALPRAZolam Duanne Moron) 0.5 MG tablet Take 0.5-1 mg by mouth See admin instructions. Take 0.5-1 mg tablet by mouth two times a day (Midday  and bedtime)   Yes [provider]  amiodarone (PACERONE) 200 MG tablet Take 1 tablet (200 mg total) by mouth daily. Patient taking differently: Take 200 mg by mouth 2 (two) times daily.  10/12/19  Yes Camnitz, Ocie Doyne, MD  apixaban (ELIQUIS) 5 MG TABS tablet Take 1 tablet (5 mg total) by mouth 2 (two) times daily. 10/12/19  Yes Camnitz, Ocie Doyne, MD  Ascorbic Acid (VITAMIN C) 1000 MG tablet Take 1,000 mg by mouth daily.   Yes [provider]  buPROPion (WELLBUTRIN XL) 150 MG 24 hr tablet Take 150 mg by mouth daily.   Yes [provider]  cetirizine (ZYRTEC) 10 MG tablet Take 10 mg by mouth daily as needed for allergies.   Yes [provider]  cholecalciferol (VITAMIN D3) 25 MCG (1000 UNIT) tablet Take 1,000 Units by mouth daily.   Yes [provider]  DULoxetine (CYMBALTA) 60 MG capsule Take 60 mg by mouth 2 (two) times daily. 02/26/19  Yes [provider]  fenofibrate (TRICOR) 48 MG tablet Take 48 mg by mouth at bedtime.   Yes [provider]  ferrous sulfate 325 (65 FE) MG tablet Take 325 mg by mouth daily.   Yes [provider]  furosemide (LASIX) 40 MG tablet Take 40 mg by mouth daily.    Yes [provider]  losartan (COZAAR) 50 MG tablet Take 50 mg by mouth daily.   Yes [provider]  Menthol, Topical Analgesic, (ICY HOT ADVANCED RELIEF EX) Apply 1 application topically 3 (three) times daily as needed (pain).    Yes [provider]  methimazole (TAPAZOLE) 5 MG tablet Take 5 mg by mouth at bedtime.   Yes [provider]  metoprolol succinate (TOPROL-XL) 100 MG 24 hr tablet Take 1 tablet (100 mg total) by mouth daily. Take with or immediately following a meal. 10/12/19  Yes Camnitz, Will Hassell Done, MD  pantoprazole (PROTONIX) 40 MG tablet Take 40 mg by mouth 2 (two) times daily.  10/15/19  Yes [provider]  polyethylene glycol (MIRALAX / GLYCOLAX) 17 g packet Take 17 g by  mouth at bedtime as needed for moderate constipation.   Yes [provider]  traMADol (ULTRAM) 50 MG tablet Take 50 mg by mouth 4 (four) times daily as needed for pain. 10/20/19  Yes [provider]    Physical Exam: Vitals:   12/22/19 1815 12/22/19 1830  BP:  (!) 166/79  Resp:  16  SpO2: 98%     Vitals:   12/22/19 1815 12/22/19 1830  BP:  (!) 166/79  Resp:  16  SpO2: 98%      Constitutional: Sleepy, no apparent distress, evident multiple ecchymoses purpura throughout the upper extremities and face Eyes: Swollen left eye notably with periorbital swelling ENMT: Limited exam due to swelling and dressings, left cheek with dressing applied. Neck: Limited exam Respiratory: clear to auscultation bilaterally, no wheezing, no crackles. Normal respiratory effort. No accessory muscle use.  Cardiovascular: Regular rate and rhythm, no murmurs / rubs / gallops. No extremity edema. 2+ pedal pulses. No carotid bruits.  Abdomen: no tenderness, no masses palpated. No hepatosplenomegaly. Bowel sounds positive.  Musculoskeletal: no clubbing / cyanosis. No joint deformity upper and lower extremities.  Skin: no rashes, lesions, ulcers. No induration Neurologic: Limited exam, no gross deficits Psychiatric: Slightly agitated likely due to difficulty hearing with dressing   Labs on Admission: I have personally reviewed following labs and imaging studies  CBC: Recent Labs  Lab 12/22/19 1806  WBC 8.0  NEUTROABS 6.1  HGB 10.2*  HCT 32.8*  MCV 102.5*  PLT [id number]*   Basic Metabolic Panel: Recent Labs  Lab 12/22/19 1806  NA 131*  K 4.7  CL 98  CO2 25  GLUCOSE 115*  BUN 25*  CREATININE 1.58*  CALCIUM 9.4   GFR: CrCl cannot be calculated (Unknown ideal weight.). Liver Function Tests: Recent Labs  Lab 12/22/19 1806  AST 43*  ALT 63*  ALKPHOS 50  BILITOT 0.5  PROT 6.6  ALBUMIN 4.1   No results for input(s): LIPASE, AMYLASE in the last 168 hours. No results for  input(s): AMMONIA in the last 168 hours. Coagulation Profile: Recent Labs  Lab 12/22/19 1806  INR 1.3*   Cardiac Enzymes: No results for input(s): CKTOTAL, CKMB, CKMBINDEX, TROPONINI in the last 168 hours. BNP (last 3 results) Recent Labs    04/23/19 1233 06/12/19 1705 10/16/19 1020  PROBNP 3,353* 12,215* 449*   HbA1C: No results for input(s): HGBA1C in the last  72 hours. CBG: No results for input(s): GLUCAP in the last 168 hours. Lipid Profile: No results for input(s): CHOL, HDL, LDLCALC, TRIG, CHOLHDL, LDLDIRECT in the last 72 hours. Thyroid Function Tests: No results for input(s): TSH, T4TOTAL, FREET4, T3FREE, THYROIDAB in the last 72 hours. Anemia Panel: No results for input(s): VITAMINB12, FOLATE, FERRITIN, TIBC, IRON, RETICCTPCT in the last 72 hours. Urine analysis:    Component Value Date/Time   COLORURINE STRAW (A) 07/09/2019 0826   APPEARANCEUR CLEAR 07/09/2019 0826   LABSPEC 1.006 07/09/2019 0826   PHURINE 6.0 07/09/2019 0826   GLUCOSEU NEGATIVE 07/09/2019 0826   HGBUR NEGATIVE 07/09/2019 0826   BILIRUBINUR NEGATIVE 07/09/2019 0826   KETONESUR NEGATIVE 07/09/2019 0826   PROTEINUR NEGATIVE 07/09/2019 0826   NITRITE NEGATIVE 07/09/2019 0826   LEUKOCYTESUR NEGATIVE 07/09/2019 0826    Radiological Exams on Admission: DG Pelvis 1-2 Views  Result Date: 12/22/2019 CLINICAL DATA:  Fall with bilateral hip pain EXAM: PELVIS - 1-2 VIEW COMPARISON:  07/10/2019 FINDINGS: There is no evidence of pelvic fracture or diastasis. No pelvic bone lesions are seen. IMPRESSION: Negative. Electronically Signed   By: Donavan Foil M.D.   On: 12/22/2019 18:40   DG Knee 2 Views Left  Result Date: 12/22/2019 CLINICAL DATA:  Recent fall. Knee pain. EXAM: LEFT KNEE - 1-2 VIEW COMPARISON:  None. FINDINGS: The joint spaces are fairly well maintained. Slight lateral joint space narrowing and early spurring. Possible tiny avulsion fracture involving the lateral tibia. Difficult to be certain  because there is significant clothing artifact. Suspect suprapatellar knee joint effusion. IMPRESSION: 1. Possible tiny avulsion fracture involving the lateral tibia. 2. Suspect suprapatellar knee joint effusion. 3. Mild lateral joint space narrowing and early spurring. Electronically Signed   By: Marijo Sanes M.D.   On: 12/22/2019 18:43   CT Head Wo Contrast  Result Date: 12/22/2019 CLINICAL DATA:  Multiple falls, scalp laceration, Eliquis EXAM: CT HEAD WITHOUT CONTRAST CT MAXILLOFACIAL WITHOUT CONTRAST CT CERVICAL SPINE WITHOUT CONTRAST TECHNIQUE: Multidetector CT imaging of the head, cervical spine, and maxillofacial structures were performed using the standard protocol without intravenous contrast. Multiplanar CT image reconstructions of the cervical spine and maxillofacial structures were also generated. COMPARISON:  07/09/2019 FINDINGS: CT HEAD FINDINGS Brain: No evidence of acute infarction, hemorrhage, hydrocephalus, extra-axial collection or mass lesion/mass effect. Mild periventricular white matter hypodensity. Vascular: No hyperdense vessel or unexpected calcification. CT FACIAL BONES FINDINGS Skull: Normal. Negative for fracture or focal lesion. Facial bones: No displaced fractures or dislocations of the facial bones proper. Sinuses/Orbits: There is a hyperdense fluid level within the left maxillary sinus. Small, mildly displaced blowout fracture fragment of the left orbital floor, fracture fragment measuring approximately 5 mm x 9 mm with 4 mm displacement. There is a small amount of fat herniation without involvement of the inferior rectus. Other: Soft tissue contusion and laceration of the left cheek and eyelids. Right parietal scalp laceration. CT CERVICAL SPINE FINDINGS Alignment: Normal. Skull base and vertebrae: No acute fracture. No primary bone lesion or focal pathologic process. Soft tissues and spinal canal: No prevertebral fluid or swelling. No visible canal hematoma. Disc levels:   Intact. Upper chest: Negative. Other: None. IMPRESSION: 1.  No acute intracranial pathology. 2. Small, mildly displaced blowout fracture fragment of the left orbital floor, fracture fragment measuring approximately 5 mm x 9 mm with 4 mm displacement. There is a small amount of fat herniation without involvement of the inferior rectus. 3. Soft tissue contusion and laceration of the left cheek and  eyelids. 4.  Right parietal scalp laceration. 5.  No fracture or static subluxation of the cervical spine. Electronically Signed   By: Eddie Candle M.D.   On: 12/22/2019 19:43   CT Cervical Spine Wo Contrast  Result Date: 12/22/2019 CLINICAL DATA:  Multiple falls, scalp laceration, Eliquis EXAM: CT HEAD WITHOUT CONTRAST CT MAXILLOFACIAL WITHOUT CONTRAST CT CERVICAL SPINE WITHOUT CONTRAST TECHNIQUE: Multidetector CT imaging of the head, cervical spine, and maxillofacial structures were performed using the standard protocol without intravenous contrast. Multiplanar CT image reconstructions of the cervical spine and maxillofacial structures were also generated. COMPARISON:  07/09/2019 FINDINGS: CT HEAD FINDINGS Brain: No evidence of acute infarction, hemorrhage, hydrocephalus, extra-axial collection or mass lesion/mass effect. Mild periventricular white matter hypodensity. Vascular: No hyperdense vessel or unexpected calcification. CT FACIAL BONES FINDINGS Skull: Normal. Negative for fracture or focal lesion. Facial bones: No displaced fractures or dislocations of the facial bones proper. Sinuses/Orbits: There is a hyperdense fluid level within the left maxillary sinus. Small, mildly displaced blowout fracture fragment of the left orbital floor, fracture fragment measuring approximately 5 mm x 9 mm with 4 mm displacement. There is a small amount of fat herniation without involvement of the inferior rectus. Other: Soft tissue contusion and laceration of the left cheek and eyelids. Right parietal scalp laceration. CT CERVICAL  SPINE FINDINGS Alignment: Normal. Skull base and vertebrae: No acute fracture. No primary bone lesion or focal pathologic process. Soft tissues and spinal canal: No prevertebral fluid or swelling. No visible canal hematoma. Disc levels:  Intact. Upper chest: Negative. Other: None. IMPRESSION: 1.  No acute intracranial pathology. 2. Small, mildly displaced blowout fracture fragment of the left orbital floor, fracture fragment measuring approximately 5 mm x 9 mm with 4 mm displacement. There is a small amount of fat herniation without involvement of the inferior rectus. 3. Soft tissue contusion and laceration of the left cheek and eyelids. 4.  Right parietal scalp laceration. 5.  No fracture or static subluxation of the cervical spine. Electronically Signed   By: Eddie Candle M.D.   On: 12/22/2019 19:43   DG Chest Port 1 View  Result Date: 12/22/2019 CLINICAL DATA:  Fall EXAM: PORTABLE CHEST 1 VIEW COMPARISON:  07/09/2019 FINDINGS: The heart size and mediastinal contours are within normal limits. Aortic atherosclerosis. Both lungs are clear. The visualized skeletal structures are unremarkable. IMPRESSION: No active disease. Electronically Signed   By: Donavan Foil M.D.   On: 12/22/2019 18:40   CT Maxillofacial WO CM  Result Date: 12/22/2019 CLINICAL DATA:  Multiple falls, scalp laceration, Eliquis EXAM: CT HEAD WITHOUT CONTRAST CT MAXILLOFACIAL WITHOUT CONTRAST CT CERVICAL SPINE WITHOUT CONTRAST TECHNIQUE: Multidetector CT imaging of the head, cervical spine, and maxillofacial structures were performed using the standard protocol without intravenous contrast. Multiplanar CT image reconstructions of the cervical spine and maxillofacial structures were also generated. COMPARISON:  07/09/2019 FINDINGS: CT HEAD FINDINGS Brain: No evidence of acute infarction, hemorrhage, hydrocephalus, extra-axial collection or mass lesion/mass effect. Mild periventricular white matter hypodensity. Vascular: No hyperdense vessel or  unexpected calcification. CT FACIAL BONES FINDINGS Skull: Normal. Negative for fracture or focal lesion. Facial bones: No displaced fractures or dislocations of the facial bones proper. Sinuses/Orbits: There is a hyperdense fluid level within the left maxillary sinus. Small, mildly displaced blowout fracture fragment of the left orbital floor, fracture fragment measuring approximately 5 mm x 9 mm with 4 mm displacement. There is a small amount of fat herniation without involvement of the inferior rectus. Other: Soft tissue contusion  and laceration of the left cheek and eyelids. Right parietal scalp laceration. CT CERVICAL SPINE FINDINGS Alignment: Normal. Skull base and vertebrae: No acute fracture. No primary bone lesion or focal pathologic process. Soft tissues and spinal canal: No prevertebral fluid or swelling. No visible canal hematoma. Disc levels:  Intact. Upper chest: Negative. Other: None. IMPRESSION: 1.  No acute intracranial pathology. 2. Small, mildly displaced blowout fracture fragment of the left orbital floor, fracture fragment measuring approximately 5 mm x 9 mm with 4 mm displacement. There is a small amount of fat herniation without involvement of the inferior rectus. 3. Soft tissue contusion and laceration of the left cheek and eyelids. 4.  Right parietal scalp laceration. 5.  No fracture or static subluxation of the cervical spine. Electronically Signed   By: Eddie Candle M.D.   On: 12/22/2019 19:43    EKG: Independently reviewed.   Assessment/Plan Anjail Sanden is a 59 y.o. female with medical history significant of systolic heart failure, CKD, A. fib/flutter on apixaban, hyperthyroidism, gastric ulcers, and CAD who presents after 2 ground-level falls within 24 hours with sustained ecchymoses and multiple areas of bruising as well as bleeding cheek abrasion and mildly displaced blowout fracture of left orbital floor.  #Ground-level fall #Mildly displaced blowout fracture of left  orbital floor #Bleeding cheek abrasion #Avulsion fracture involving the lateral tibia -Patient's fall seemingly are not related to syncope or presyncope based off history.  Collateral would be important in eliciting more information as well as safety at home.  Given extensive bleeding to left cheek and difficulty achieving hemostasis I have held her apixaban at the moment. -CT survey of head reveals blowout fracture of left orbital floor, right parietal scalp laceration, left cheek laceration.  C-spine without fracture or subluxation.  Plain films of knee reveals tiny avulsion fracture of left tibia.  No fracture to pelvis -At this time it does not appear to be syncope, however if daughter provides different history would obtain further work-up -ER discussed periorbital fracture with ENT who reported that patient can follow-up in clinic this week -Could discuss with Ortho regarding avulsion fracture but does not appear to require acute intervention -Sodium 131 but doubt contributing given mentation intact, anticipate improvement in am (otherwise consider holding diuretics for possibly hypovolemia) -Infectious work-up unrevealing -PT/OT/SLP/social work for home safety eval  #Chronic systolic heart failure #Paroxysmal A. fib/flutter #CAD -Held apixaban, continued Lasix, losartan, metoprolol  #CKD 3 -Creatinine improved from recent baseline -Monitor I/O -Avoid nephrotoxic agents  #Macrocytic anemia -Stable, no acute issues  #Hypertension -Continue metoprolol, losartan  #Hypothyroidism -Continue methimazole  #GERD/gastric ulcers -Continue pantoprazole  #Hypertriglyceridemia -Continue fenofibrate   DVT prophylaxis: SCDs, apixaban held Code Status: Full Family Communication: Contact daughter in a.m. Consults called: ER spoke with ENT Admission status: Obs   Truddie Hidden MD [facility] Pager [phone]  If 7PM-7AM, please contact  night-coverage [email or web address] Password [facility]  12/22/2019, 9:16 PM

## 2019-12-22 NOTE — Progress Notes (Signed)
Orthopedic Tech Progress Note Patient Details:  Brianna Porter 02/18/1961 [id number]  Ortho Devices Type of Ortho Device: Knee Immobilizer Ortho Device/Splint Location: lle Ortho Device/Splint Interventions: Ordered, Application, Adjustment   Post Interventions Patient Tolerated: Well Instructions Provided: Care of device, Adjustment of device   [name] 12/22/2019, 9:52 PM

## 2019-12-22 NOTE — ED Notes (Signed)
Quick clot dressing applied to patients L cheek

## 2019-12-22 NOTE — ED Notes (Signed)
Patient transported to CT 

## 2019-12-22 NOTE — ED Triage Notes (Addendum)
C/O weakness and dizziness recently; worst the last 2 days; EMS endorsed re-current falls. And fall twice today; + head injury; -LOC; on Eliquis. VS: 166/82 HR 48; RR 20; 96%RA BGL 126;

## 2019-12-22 NOTE — ED Provider Notes (Signed)
[place] EMERGENCY DEPARTMENT Provider Note   CSN: [id number] Arrival date & time: 12/22/19  1752     History Chief Complaint  Patient presents with  . Fall    Brianna Porter is a 59 y.o. female.  59 year old female with prior medical history as detailed below presents for evaluation following reported falls.  Patient reports 2 falls over the last 24 hours.  She struck her left cheek early this morning.  She has an abrasion that has been persistently bleeding since.  She struck her right occiput this afternoon.  She denies loss of conscious.  She denies neck pain.  She does complain of left lateral knee pain.  She walks with a walker at baseline.  She resides with her daughter is her primary caregiver at home.  She is on Eliquis.  The history is provided by the patient, the EMS personnel and medical records.  Fall This is a recurrent problem. The current episode started 6 to 12 hours ago. The problem occurs constantly. The problem has been gradually worsening. Nothing aggravates the symptoms. Nothing relieves the symptoms.       Past Medical History:  Diagnosis Date  . Anxiety and depression   . Atrial flutter ([place])   . Chronic systolic CHF (congestive heart failure) (HCC)    a. dx in setting of atrial fib/flutter - possibly tachy mediated. Coronary CTA with only mild CAD in 04/2019.  Marland Kitchen CKD (chronic kidney disease), stage II   . Confusion    a. persistent confusion during 04/2019 admission of unclear cause. Home meds adjusted. CT/MRI brain nonacute.  . Diabetes ([place])   . Edema   . Elevated liver function tests   . Hypercholesteremia   . Hyperkalemia   . Hypertension   . Hypertensive heart and chronic kidney disease with systolic congestive heart failure ([place])   . Hyperthyroidism   . Hypokalemia   . Hypokalemia   . Hypomagnesemia   . Hyponatremia   . Iron deficiency anemia   . Mild CAD    a. Coronary CT 04/2019 - Minimal, Non-obstructive CAD.  Marland Kitchen NICM  (nonischemic cardiomyopathy) ([place])   . Persistent atrial fibrillation ([place])   . Prolonged QT interval     Patient Active Problem List   Diagnosis Date Noted  . Gastrointestinal hemorrhage   . Pressure injury of skin 07/09/2019  . Acute gastric ulcer with hemorrhage   . Symptomatic anemia 06/28/2019  . Elevated liver function tests   . NICM (nonischemic cardiomyopathy) ([place])   . Mild CAD   . Confusion   . Diabetes ([place])   . Iron deficiency anemia   . Hyperthyroidism   . Altered mental status 05/07/2019  . Acute systolic heart failure ([place]) 05/07/2019  . AKI (acute kidney injury) ([place]) 05/07/2019  . Hyponatremia   . CAD (coronary artery disease)   . Essential hypertension   . Hypokalemia   . Prolonged Q-T interval on ECG 04/30/2019  . Atrial fibrillation with rapid ventricular response ([place]) 04/29/2019  . Persistent atrial fibrillation ([place])   . Hyperlipidemia 04/23/2019  . Anemia 04/23/2019  . CKD (chronic kidney disease) stage 3, GFR 30-59 ml/min 04/23/2019  . Depression 03/02/2019  . Osteoarthritis 03/02/2019  . Type 2 diabetes mellitus with complication, without long-term current use of insulin ([place]) 03/02/2019  . Hypertensive heart disease 03/02/2019    Past Surgical History:  Procedure Laterality Date  . BIOPSY  06/29/2019   Procedure: BIOPSY;  Surgeon: Irene Shipper, MD;  Location: [facility]  ENDOSCOPY;  Service: Endoscopy;;  . CARDIOVERSION N/A 04/29/2019   Procedure: CARDIOVERSION;  Surgeon: Sanda Klein, MD;  Location: [place] ENDOSCOPY;  Service: Cardiovascular;  Laterality: N/A;  . CARDIOVERSION N/A 05/04/2019   Procedure: CARDIOVERSION;  Surgeon: Josue Hector, MD;  Location: [facility];  Service: Cardiovascular;  Laterality: N/A;  . COLONOSCOPY WITH PROPOFOL N/A 07/12/2019   Procedure: COLONOSCOPY WITH PROPOFOL;  Surgeon: Doran Stabler, MD;  Location: [place];  Service: Gastroenterology;  Laterality: N/A;  . ENTEROSCOPY N/A 07/10/2019   Procedure:  ENTEROSCOPY;  Surgeon: Doran Stabler, MD;  Location: [facility];  Service: Gastroenterology;  Laterality: N/A;  . ESOPHAGOGASTRODUODENOSCOPY  06/29/2019  . ESOPHAGOGASTRODUODENOSCOPY (EGD) WITH PROPOFOL N/A 06/29/2019   Procedure: ESOPHAGOGASTRODUODENOSCOPY (EGD) WITH PROPOFOL;  Surgeon: Irene Shipper, MD;  Location: [facility] ENDOSCOPY;  Service: Endoscopy;  Laterality: N/A;  . GIVENS CAPSULE STUDY N/A 07/12/2019   Procedure: GIVENS CAPSULE STUDY;  Surgeon: Doran Stabler, MD;  Location: [place];  Service: Gastroenterology;  Laterality: N/A;  . HEMOSTASIS CLIP PLACEMENT  07/12/2019   Procedure: HEMOSTASIS CLIP PLACEMENT;  Surgeon: Doran Stabler, MD;  Location: [place];  Service: Gastroenterology;;  . POLYPECTOMY  07/12/2019   Procedure: POLYPECTOMY;  Surgeon: Doran Stabler, MD;  Location: [place];  Service: Gastroenterology;;  . SMALL BOWEL ENTEROSCOPY  07/10/2019  . SUBMUCOSAL TATTOO INJECTION  07/10/2019   Procedure: SUBMUCOSAL TATTOO INJECTION;  Surgeon: Doran Stabler, MD;  Location: [facility] ENDOSCOPY;  Service: Gastroenterology;;  . TEE WITHOUT CARDIOVERSION  04/29/2019  . TEE WITHOUT CARDIOVERSION N/A 04/29/2019   Procedure: TRANSESOPHAGEAL ECHOCARDIOGRAM (TEE);  Surgeon: Sanda Klein, MD;  Location: [facility] ENDOSCOPY;  Service: Cardiovascular;  Laterality: N/A;  . TUBAL LIGATION       OB History   No obstetric history on file.     Family History  Problem Relation Age of Onset  . Cancer Mother   . Heart disease Mother   . Hypercholesterolemia Mother   . Osteoarthritis Mother   . Stroke Mother   . Heart disease Father   . Hypercholesterolemia Brother     Social History   Tobacco Use  . Smoking status: Former Smoker    Packs/day: 2.00    Years: 30.00    Pack years: 60.00    Types: Cigarettes    Quit date: 2001    Years since quitting: 20.3  . Smokeless tobacco: Never Used  Substance Use Topics  . Alcohol use: Never  . Drug use: Never     Home Medications Prior to Admission medications   Medication Sig Start Date End Date Taking? Authorizing Provider  acetaminophen (TYLENOL) 325 MG tablet Take 650 mg by mouth every 6 (six) hours as needed for mild pain.   Yes [provider]  albuterol (VENTOLIN HFA) 108 (90 Base) MCG/ACT inhaler Inhale 2 puffs into the lungs every 6 (six) hours as needed for wheezing or shortness of breath.   Yes [provider]  ALPRAZolam Duanne Moron) 0.5 MG tablet Take 0.5-1 mg by mouth See admin instructions. Take 0.5-1 mg tablet by mouth two times a day (Midday and bedtime)   Yes [provider]  amiodarone (PACERONE) 200 MG tablet Take 1 tablet (200 mg total) by mouth daily. Patient taking differently: Take 200 mg by mouth 2 (two) times daily.  10/12/19  Yes Camnitz, Ocie Doyne, MD  apixaban (ELIQUIS) 5 MG TABS tablet Take 1 tablet (5 mg total) by mouth 2 (two) times daily. 10/12/19  Yes Camnitz, Ocie Doyne, MD  Ascorbic Acid (VITAMIN C) 1000 MG tablet Take 1,000 mg by mouth daily.   Yes [provider]  buPROPion (WELLBUTRIN XL) 150 MG 24 hr tablet Take 150 mg by mouth daily.   Yes [provider]  cetirizine (ZYRTEC) 10 MG tablet Take 10 mg by mouth daily as needed for allergies.   Yes [provider]  cholecalciferol (VITAMIN D3) 25 MCG (1000 UNIT) tablet Take 1,000 Units by mouth daily.   Yes [provider]  DULoxetine (CYMBALTA) 60 MG capsule Take 60 mg by mouth 2 (two) times daily. 02/26/19  Yes [provider]  fenofibrate (TRICOR) 48 MG tablet Take 48 mg by mouth at bedtime.   Yes [provider]  ferrous sulfate 325 (65 FE) MG tablet Take 325 mg by mouth daily.   Yes [provider]  furosemide (LASIX) 40 MG tablet Take 40 mg by mouth daily.    Yes [provider]  losartan (COZAAR) 50 MG tablet Take 50 mg by mouth daily.   Yes [provider]  Menthol, Topical Analgesic, (ICY HOT ADVANCED  RELIEF EX) Apply 1 application topically 3 (three) times daily as needed (pain).    Yes [provider]  methimazole (TAPAZOLE) 5 MG tablet Take 5 mg by mouth at bedtime.   Yes [provider]  metoprolol succinate (TOPROL-XL) 100 MG 24 hr tablet Take 1 tablet (100 mg total) by mouth daily. Take with or immediately following a meal. 10/12/19  Yes Camnitz, Will Hassell Done, MD  pantoprazole (PROTONIX) 40 MG tablet Take 40 mg by mouth 2 (two) times daily.  10/15/19  Yes [provider]  polyethylene glycol (MIRALAX / GLYCOLAX) 17 g packet Take 17 g by mouth at bedtime as needed for moderate constipation.   Yes [provider]  traMADol (ULTRAM) 50 MG tablet Take 50 mg by mouth 4 (four) times daily as needed for pain. 10/20/19  Yes [provider]    Allergies    Ambien [zolpidem tartrate], Adhesive [tape], and Codeine  Review of Systems   Review of Systems  All other systems reviewed and are negative.   Physical Exam Updated Vital Signs BP (!) 166/79   Resp 16   LMP  (LMP Unknown) Comment: ?10 years ago?  SpO2 98%   Physical Exam Vitals and nursing note reviewed.  Constitutional:      General: She is not in acute distress.    Appearance: She is well-developed.  HENT:     Head: Normocephalic.     Comments: Large abrasion overlying the left cheek approximately 4 x 4 cm.  This is oozing blood.  There is no specific laceration.  Small nonbleeding abrasion noted to the right occiput. Eyes:     Conjunctiva/sclera: Conjunctivae normal.     Pupils: Pupils are equal, round, and reactive to light.  Cardiovascular:     Rate and Rhythm: Normal rate and regular rhythm.     Heart sounds: Normal heart sounds.  Pulmonary:     Effort: Pulmonary effort is normal. No respiratory distress.     Breath sounds: Normal breath sounds.  Abdominal:     General: There is no distension.     Palpations: Abdomen is soft.     Tenderness: There is no abdominal  tenderness.  Musculoskeletal:        General: No deformity. Normal range of motion.     Cervical back: Normal range of motion and neck supple.  Skin:  General: Skin is warm and dry.     Comments: Multiple ecchymoses of different ages overlying the patient's extremities and thorax.  Neurological:     Mental Status: She is alert and oriented to person, place, and time.     ED Results / Procedures / Treatments   Labs (all labs ordered are listed, but only abnormal results are displayed) Labs Reviewed  CBC WITH DIFFERENTIAL/PLATELET - Abnormal; Notable for the following components:      Result Value   RBC 3.20 (*)    Hemoglobin 10.2 (*)    HCT 32.8 (*)    MCV 102.5 (*)    Platelets 402 (*)    Basophils Absolute 0.2 (*)    All other components within normal limits  PROTIME-INR - Abnormal; Notable for the following components:   Prothrombin Time 15.3 (*)    INR 1.3 (*)    All other components within normal limits  COMPREHENSIVE METABOLIC PANEL - Abnormal; Notable for the following components:   Sodium 131 (*)    Glucose, Bld 115 (*)    BUN 25 (*)    Creatinine, Ser 1.58 (*)    AST 43 (*)    ALT 63 (*)    GFR calc non Af Amer 36 (*)    GFR calc Af Amer 41 (*)    All other components within normal limits  RESPIRATORY PANEL BY RT PCR (FLU A&B, COVID)  URINALYSIS, ROUTINE W REFLEX MICROSCOPIC  RAPID URINE DRUG SCREEN, HOSP PERFORMED  TYPE AND SCREEN    EKG EKG Interpretation  Date/Time:  Tuesday Dec 22 2019 18:04:36 EDT Ventricular Rate:  50 PR Interval:    QRS Duration: 105 QT Interval:  527 QTC Calculation: 476 R Axis:   75 Text Interpretation: Junctional rhythm Low voltage, extremity leads Borderline repolarization abnormality Reconfirmed by Dene Gentry [phone]) on 12/22/2019 10:04:38 PM   Radiology DG Pelvis 1-2 Views  Result Date: 12/22/2019 CLINICAL DATA:  Fall with bilateral hip pain EXAM: PELVIS - 1-2 VIEW COMPARISON:  07/10/2019 FINDINGS: There is no  evidence of pelvic fracture or diastasis. No pelvic bone lesions are seen. IMPRESSION: Negative. Electronically Signed   By: Donavan Foil M.D.   On: 12/22/2019 18:40   DG Knee 2 Views Left  Result Date: 12/22/2019 CLINICAL DATA:  Recent fall. Knee pain. EXAM: LEFT KNEE - 1-2 VIEW COMPARISON:  None. FINDINGS: The joint spaces are fairly well maintained. Slight lateral joint space narrowing and early spurring. Possible tiny avulsion fracture involving the lateral tibia. Difficult to be certain because there is significant clothing artifact. Suspect suprapatellar knee joint effusion. IMPRESSION: 1. Possible tiny avulsion fracture involving the lateral tibia. 2. Suspect suprapatellar knee joint effusion. 3. Mild lateral joint space narrowing and early spurring. Electronically Signed   By: Marijo Sanes M.D.   On: 12/22/2019 18:43   CT Head Wo Contrast  Result Date: 12/22/2019 CLINICAL DATA:  Multiple falls, scalp laceration, Eliquis EXAM: CT HEAD WITHOUT CONTRAST CT MAXILLOFACIAL WITHOUT CONTRAST CT CERVICAL SPINE WITHOUT CONTRAST TECHNIQUE: Multidetector CT imaging of the head, cervical spine, and maxillofacial structures were performed using the standard protocol without intravenous contrast. Multiplanar CT image reconstructions of the cervical spine and maxillofacial structures were also generated. COMPARISON:  07/09/2019 FINDINGS: CT HEAD FINDINGS Brain: No evidence of acute infarction, hemorrhage, hydrocephalus, extra-axial collection or mass lesion/mass effect. Mild periventricular white matter hypodensity. Vascular: No hyperdense vessel or unexpected calcification. CT FACIAL BONES FINDINGS Skull: Normal. Negative for fracture or focal lesion. Facial bones: No  displaced fractures or dislocations of the facial bones proper. Sinuses/Orbits: There is a hyperdense fluid level within the left maxillary sinus. Small, mildly displaced blowout fracture fragment of the left orbital floor, fracture fragment measuring  approximately 5 mm x 9 mm with 4 mm displacement. There is a small amount of fat herniation without involvement of the inferior rectus. Other: Soft tissue contusion and laceration of the left cheek and eyelids. Right parietal scalp laceration. CT CERVICAL SPINE FINDINGS Alignment: Normal. Skull base and vertebrae: No acute fracture. No primary bone lesion or focal pathologic process. Soft tissues and spinal canal: No prevertebral fluid or swelling. No visible canal hematoma. Disc levels:  Intact. Upper chest: Negative. Other: None. IMPRESSION: 1.  No acute intracranial pathology. 2. Small, mildly displaced blowout fracture fragment of the left orbital floor, fracture fragment measuring approximately 5 mm x 9 mm with 4 mm displacement. There is a small amount of fat herniation without involvement of the inferior rectus. 3. Soft tissue contusion and laceration of the left cheek and eyelids. 4.  Right parietal scalp laceration. 5.  No fracture or static subluxation of the cervical spine. Electronically Signed   By: Eddie Candle M.D.   On: 12/22/2019 19:43   CT Cervical Spine Wo Contrast  Result Date: 12/22/2019 CLINICAL DATA:  Multiple falls, scalp laceration, Eliquis EXAM: CT HEAD WITHOUT CONTRAST CT MAXILLOFACIAL WITHOUT CONTRAST CT CERVICAL SPINE WITHOUT CONTRAST TECHNIQUE: Multidetector CT imaging of the head, cervical spine, and maxillofacial structures were performed using the standard protocol without intravenous contrast. Multiplanar CT image reconstructions of the cervical spine and maxillofacial structures were also generated. COMPARISON:  07/09/2019 FINDINGS: CT HEAD FINDINGS Brain: No evidence of acute infarction, hemorrhage, hydrocephalus, extra-axial collection or mass lesion/mass effect. Mild periventricular white matter hypodensity. Vascular: No hyperdense vessel or unexpected calcification. CT FACIAL BONES FINDINGS Skull: Normal. Negative for fracture or focal lesion. Facial bones: No displaced  fractures or dislocations of the facial bones proper. Sinuses/Orbits: There is a hyperdense fluid level within the left maxillary sinus. Small, mildly displaced blowout fracture fragment of the left orbital floor, fracture fragment measuring approximately 5 mm x 9 mm with 4 mm displacement. There is a small amount of fat herniation without involvement of the inferior rectus. Other: Soft tissue contusion and laceration of the left cheek and eyelids. Right parietal scalp laceration. CT CERVICAL SPINE FINDINGS Alignment: Normal. Skull base and vertebrae: No acute fracture. No primary bone lesion or focal pathologic process. Soft tissues and spinal canal: No prevertebral fluid or swelling. No visible canal hematoma. Disc levels:  Intact. Upper chest: Negative. Other: None. IMPRESSION: 1.  No acute intracranial pathology. 2. Small, mildly displaced blowout fracture fragment of the left orbital floor, fracture fragment measuring approximately 5 mm x 9 mm with 4 mm displacement. There is a small amount of fat herniation without involvement of the inferior rectus. 3. Soft tissue contusion and laceration of the left cheek and eyelids. 4.  Right parietal scalp laceration. 5.  No fracture or static subluxation of the cervical spine. Electronically Signed   By: Eddie Candle M.D.   On: 12/22/2019 19:43   DG Chest Port 1 View  Result Date: 12/22/2019 CLINICAL DATA:  Fall EXAM: PORTABLE CHEST 1 VIEW COMPARISON:  07/09/2019 FINDINGS: The heart size and mediastinal contours are within normal limits. Aortic atherosclerosis. Both lungs are clear. The visualized skeletal structures are unremarkable. IMPRESSION: No active disease. Electronically Signed   By: Donavan Foil M.D.   On: 12/22/2019 18:40  CT Maxillofacial WO CM  Result Date: 12/22/2019 CLINICAL DATA:  Multiple falls, scalp laceration, Eliquis EXAM: CT HEAD WITHOUT CONTRAST CT MAXILLOFACIAL WITHOUT CONTRAST CT CERVICAL SPINE WITHOUT CONTRAST TECHNIQUE: Multidetector CT  imaging of the head, cervical spine, and maxillofacial structures were performed using the standard protocol without intravenous contrast. Multiplanar CT image reconstructions of the cervical spine and maxillofacial structures were also generated. COMPARISON:  07/09/2019 FINDINGS: CT HEAD FINDINGS Brain: No evidence of acute infarction, hemorrhage, hydrocephalus, extra-axial collection or mass lesion/mass effect. Mild periventricular white matter hypodensity. Vascular: No hyperdense vessel or unexpected calcification. CT FACIAL BONES FINDINGS Skull: Normal. Negative for fracture or focal lesion. Facial bones: No displaced fractures or dislocations of the facial bones proper. Sinuses/Orbits: There is a hyperdense fluid level within the left maxillary sinus. Small, mildly displaced blowout fracture fragment of the left orbital floor, fracture fragment measuring approximately 5 mm x 9 mm with 4 mm displacement. There is a small amount of fat herniation without involvement of the inferior rectus. Other: Soft tissue contusion and laceration of the left cheek and eyelids. Right parietal scalp laceration. CT CERVICAL SPINE FINDINGS Alignment: Normal. Skull base and vertebrae: No acute fracture. No primary bone lesion or focal pathologic process. Soft tissues and spinal canal: No prevertebral fluid or swelling. No visible canal hematoma. Disc levels:  Intact. Upper chest: Negative. Other: None. IMPRESSION: 1.  No acute intracranial pathology. 2. Small, mildly displaced blowout fracture fragment of the left orbital floor, fracture fragment measuring approximately 5 mm x 9 mm with 4 mm displacement. There is a small amount of fat herniation without involvement of the inferior rectus. 3. Soft tissue contusion and laceration of the left cheek and eyelids. 4.  Right parietal scalp laceration. 5.  No fracture or static subluxation of the cervical spine. Electronically Signed   By: Eddie Candle M.D.   On: 12/22/2019 19:43     Procedures Procedures (including critical care time)  Medications Ordered in ED Medications - No data to display  ED Course  I have reviewed the triage vital signs and the nursing notes.  Pertinent labs & imaging results that were available during my care of the patient were reviewed by me and considered in my medical decision making (see chart for details).    MDM Rules/Calculators/A&P                      MDM  Screen complete  Alouise Levene was evaluated in Emergency Department on 12/22/2019 for the symptoms described in the history of present illness. She was evaluated in the context of the global COVID-19 pandemic, which necessitated consideration that the patient might be at risk for infection with the SARS-CoV-2 virus that causes COVID-19. Institutional protocols and algorithms that pertain to the evaluation of patients at risk for COVID-19 are in a state of rapid change based on information released by regulatory bodies including the CDC and federal and state organizations. These policies and algorithms were followed during the patient's care in the ED.  Patient is presenting for evaluation following multiple falls at home.  Patient is a significant fall risk given her concurrent use of Eliquis.  Work-up today demonstrates evidence of possible fracture of the left knee.  She also has evidence of left orbital blowout fracture.  Her facial fractures were discussed with the ENT Dr. Iran Planas.  She requires outpatient management.  No antibiotics are indicated.  Pressure dressing overlying the left cheek was placed with a base of combat gauze.  This appears to have controlled the bleeding from the left cheek abrasion while in the ED.  Given patient's multiple comorbidities and significant fall risk an observation admission overnight appears appropriate.  The patient's daughter is in agreement with this plan.  The patient is not fully happy with the plan to stay.  She does not  however have a a ride home otherwise.  I do not feel that she would be a safe discharge.      Final Clinical Impression(s) / ED Diagnoses Final diagnoses:  Fall, initial encounter  Abrasion  Fall in home, initial encounter    Rx / DC Orders ED Discharge Orders    None       Valarie Merino, MD 12/22/19 2204

## 2019-12-23 ENCOUNTER — Encounter (HOSPITAL_COMMUNITY): Payer: Self-pay | Admitting: Internal Medicine

## 2019-12-23 ENCOUNTER — Other Ambulatory Visit: Payer: Self-pay

## 2019-12-23 DIAGNOSIS — W19XXXA Unspecified fall, initial encounter: Secondary | ICD-10-CM

## 2019-12-23 DIAGNOSIS — S0285XA Fracture of orbit, unspecified, initial encounter for closed fracture: Secondary | ICD-10-CM | POA: Diagnosis not present

## 2019-12-23 DIAGNOSIS — W19XXXS Unspecified fall, sequela: Secondary | ICD-10-CM | POA: Diagnosis not present

## 2019-12-23 HISTORY — DX: Unspecified fall, initial encounter: W19.XXXA

## 2019-12-23 LAB — BASIC METABOLIC PANEL
Anion gap: 9 (ref 5–15)
BUN: 22 mg/dL — ABNORMAL HIGH (ref 6–20)
CO2: 22 mmol/L (ref 22–32)
Calcium: 8.1 mg/dL — ABNORMAL LOW (ref 8.9–10.3)
Chloride: 102 mmol/L (ref 98–111)
Creatinine, Ser: 1.31 mg/dL — ABNORMAL HIGH (ref 0.44–1.00)
GFR calc Af Amer: 52 mL/min — ABNORMAL LOW (ref >60)
GFR calc non Af Amer: 45 mL/min — ABNORMAL LOW (ref >60)
Glucose, Bld: 103 mg/dL — ABNORMAL HIGH (ref 70–99)
Potassium: 4.6 mmol/L (ref 3.5–5.1)
Sodium: 133 mmol/L — ABNORMAL LOW (ref 135–145)

## 2019-12-23 LAB — CBC
HCT: 25.9 % — ABNORMAL LOW (ref 36.0–46.0)
Hemoglobin: 8.1 g/dL — ABNORMAL LOW (ref 12.0–15.0)
MCH: 32.3 pg (ref 26.0–34.0)
MCHC: 31.3 g/dL (ref 30.0–36.0)
MCV: 103.2 fL — ABNORMAL HIGH (ref 80.0–100.0)
Platelets: 301 10*3/uL (ref 150–400)
RBC: 2.51 MIL/uL — ABNORMAL LOW (ref 3.87–5.11)
RDW: 14.4 % (ref 11.5–15.5)
WBC: 5.8 10*3/uL (ref 4.0–10.5)
nRBC: 0 % (ref 0.0–0.2)

## 2019-12-23 LAB — PROTIME-INR
INR: 1.3 — ABNORMAL HIGH (ref 0.8–1.2)
Prothrombin Time: 15.8 seconds — ABNORMAL HIGH (ref 11.4–15.2)

## 2019-12-23 LAB — T4, FREE: Free T4: <0.25 ng/dL — ABNORMAL LOW (ref 0.61–1.12)

## 2019-12-23 LAB — TSH: TSH: 55.003 u[IU]/mL — ABNORMAL HIGH (ref 0.350–4.500)

## 2019-12-23 MED ORDER — ALBUTEROL SULFATE HFA 108 (90 BASE) MCG/ACT IN AERS
2.0000 | INHALATION_SPRAY | Freq: Four times a day (QID) | RESPIRATORY_TRACT | Status: DC | PRN
  Filled 2019-12-23: qty 6.7

## 2019-12-23 MED ORDER — ACETAMINOPHEN 325 MG PO TABS
650.0000 mg | ORAL_TABLET | Freq: Four times a day (QID) | ORAL | Status: DC | PRN
  Administered 2019-12-23 – 2019-12-29 (×11): 650 mg via ORAL
  Filled 2019-12-23 (×11): qty 2

## 2019-12-23 MED ORDER — SODIUM CHLORIDE 0.9% FLUSH
3.0000 mL | Freq: Two times a day (BID) | INTRAVENOUS | Status: DC
  Administered 2019-12-23 – 2019-12-30 (×14): 3 mL via INTRAVENOUS

## 2019-12-23 MED ORDER — DULOXETINE HCL 60 MG PO CPEP
60.0000 mg | ORAL_CAPSULE | Freq: Two times a day (BID) | ORAL | Status: DC
  Administered 2019-12-23 – 2019-12-25 (×5): 60 mg via ORAL
  Filled 2019-12-23 (×7): qty 1

## 2019-12-23 MED ORDER — METHIMAZOLE 5 MG PO TABS
5.0000 mg | ORAL_TABLET | Freq: Every day | ORAL | Status: DC
  Filled 2019-12-23: qty 1

## 2019-12-23 MED ORDER — PANTOPRAZOLE SODIUM 40 MG PO TBEC
40.0000 mg | DELAYED_RELEASE_TABLET | Freq: Two times a day (BID) | ORAL | Status: DC
  Administered 2019-12-23 – 2019-12-30 (×15): 40 mg via ORAL
  Filled 2019-12-23 (×15): qty 1

## 2019-12-23 MED ORDER — MUSCLE RUB 10-15 % EX CREA
TOPICAL_CREAM | Freq: Three times a day (TID) | CUTANEOUS | Status: DC | PRN
  Administered 2019-12-26: 1 via TOPICAL
  Filled 2019-12-23: qty 85

## 2019-12-23 MED ORDER — BUPROPION HCL ER (XL) 150 MG PO TB24
150.0000 mg | ORAL_TABLET | Freq: Every day | ORAL | Status: DC
  Administered 2019-12-23 – 2019-12-30 (×8): 150 mg via ORAL
  Filled 2019-12-23 (×8): qty 1

## 2019-12-23 MED ORDER — METOPROLOL SUCCINATE ER 50 MG PO TB24
100.0000 mg | ORAL_TABLET | Freq: Every day | ORAL | Status: DC
  Administered 2019-12-23 – 2019-12-30 (×7): 100 mg via ORAL
  Filled 2019-12-23 (×3): qty 2
  Filled 2019-12-23: qty 4
  Filled 2019-12-23 (×3): qty 2

## 2019-12-23 MED ORDER — LOSARTAN POTASSIUM 50 MG PO TABS
50.0000 mg | ORAL_TABLET | Freq: Every day | ORAL | Status: DC
  Administered 2019-12-23: 50 mg via ORAL
  Filled 2019-12-23: qty 1

## 2019-12-23 MED ORDER — LORATADINE 10 MG PO TABS
10.0000 mg | ORAL_TABLET | Freq: Every day | ORAL | Status: DC
  Administered 2019-12-23 – 2019-12-30 (×8): 10 mg via ORAL
  Filled 2019-12-23 (×8): qty 1

## 2019-12-23 MED ORDER — AMIODARONE HCL 200 MG PO TABS
200.0000 mg | ORAL_TABLET | Freq: Two times a day (BID) | ORAL | Status: DC
  Administered 2019-12-23 – 2019-12-30 (×15): 200 mg via ORAL
  Filled 2019-12-23 (×15): qty 1

## 2019-12-23 MED ORDER — FUROSEMIDE 40 MG PO TABS
40.0000 mg | ORAL_TABLET | Freq: Every day | ORAL | Status: DC
  Administered 2019-12-23 – 2019-12-30 (×7): 40 mg via ORAL
  Filled 2019-12-23 (×2): qty 1
  Filled 2019-12-23: qty 2
  Filled 2019-12-23 (×4): qty 1

## 2019-12-23 MED ORDER — ASCORBIC ACID 500 MG PO TABS
1000.0000 mg | ORAL_TABLET | Freq: Every day | ORAL | Status: DC
  Administered 2019-12-23 – 2019-12-30 (×8): 1000 mg via ORAL
  Filled 2019-12-23 (×8): qty 2

## 2019-12-23 MED ORDER — FENOFIBRATE 54 MG PO TABS
54.0000 mg | ORAL_TABLET | Freq: Every day | ORAL | Status: DC
  Administered 2019-12-23 – 2019-12-30 (×8): 54 mg via ORAL
  Filled 2019-12-23 (×8): qty 1

## 2019-12-23 MED ORDER — ALPRAZOLAM 0.25 MG PO TABS: 0.5000 mg | ORAL_TABLET | ORAL | Status: DC

## 2019-12-23 MED ORDER — ALBUTEROL SULFATE (2.5 MG/3ML) 0.083% IN NEBU: 2.5000 mg | INHALATION_SOLUTION | Freq: Four times a day (QID) | RESPIRATORY_TRACT | Status: DC | PRN

## 2019-12-23 MED ORDER — FERROUS SULFATE 325 (65 FE) MG PO TABS
325.0000 mg | ORAL_TABLET | Freq: Every day | ORAL | Status: DC
  Administered 2019-12-23 – 2019-12-30 (×8): 325 mg via ORAL
  Filled 2019-12-23 (×8): qty 1

## 2019-12-23 MED ORDER — VITAMIN D 25 MCG (1000 UNIT) PO TABS
1000.0000 [IU] | ORAL_TABLET | Freq: Every day | ORAL | Status: DC
  Administered 2019-12-23 – 2019-12-30 (×8): 1000 [IU] via ORAL
  Filled 2019-12-23 (×8): qty 1

## 2019-12-23 MED ORDER — POLYETHYLENE GLYCOL 3350 17 G PO PACK: 17.0000 g | PACK | Freq: Every evening | ORAL | Status: DC | PRN

## 2019-12-23 NOTE — Progress Notes (Signed)
PASSR is still pending for Level II. All information requested was faxed out at 19:56 on 12/23/2019 (transmission successful) [phone]. Reference # [id number]  CSW faxed out FL2 with PASSR pending to several SNFs. Family prefers the [place] area with [facility] as first preference. CSW explained the SNF bed offer process both to Pt and to daughter.    Vergie Living MSW LCSWA Transitions of Care  Clinical Social Worker  [facility]  [phone]

## 2019-12-23 NOTE — Progress Notes (Signed)
PROGRESS NOTE    Brianna Porter  [id number] DOB: [birth year] DOA: 12/22/2019 PCP: Brianna Nakai, MD      Brief Narrative:  Brianna Porter is a 59 y.o. F with Afib on Eliquis, HTN, CAD, NICM and DM who presented with fall.  In the ER, found to have an orbital fracture, a small tibial tubercle avulsion fracture, and unsteady gait.        Assessment & Plan:  Orbital fracture This was discussed with ENT -Follow up outpatient with Dr. Bettye Porter tubercle fracture -Weight bearing as tolerated -Knee immobilizer if significant pain with standing -Analgesics -Outpatient follow up with Orthopedics  Shoulder pain No fractures.  This limits examination of arm strength. 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  Last notes suggest she was supposed to be off methimazole, but she reported to [facility] tech she was still taking -Stop methimazole -Follow T3 and free T4  Coronary disease Nonischemic cardiomyopathy Atrial fibrillation, chronic Hypertension -Continue Eliquis (has moderate bruising to eye, but benefit likely outweighs risk for continuing) -Continue amiodarone -Continue losartan, metoprolol, Lasix  Depression -Continue bupropion -Continue duloxetine   CKD IIIa Creatinine stable at baseline  Anemia of chronic disease Hgb drop noted, without hemodynamic instability or bleeding noted. -Monitor Hgb         Disposition: Status is: Observation  The patient remains OBS appropriate    Dispo: The patient is from: Home              Anticipated d/c is to: Home              Anticipated d/c date is: 1 day              Patient currently is medically stable to d/c.              MDM: The below labs and imaging reports were reviewed and summarized above.  Medication management as above.   DVT prophylaxis: Eliquis Code Status:  FULL Family Communication:     Consultants:     Procedures:     Antimicrobials:      Culture data:              Subjective: Eye hurts, eye is watering.  Shoulder hurts.  Knee hurts.  No confusion, fever, cough.  No chest pain, dyspnea.  Objective: Vitals:   12/23/19 1500 12/23/19 1600 12/23/19 1700 12/23/19 1800  BP: 134/66 128/77 (!) 150/66 138/68  Pulse: (!) 52 (!) 52 (!) 51 (!) 52  Resp: 15 17 13 17   Temp:      TempSrc:      SpO2: 99% 98% 99% 97%   No intake or output data in the 24 hours ending 12/23/19 1858 There were no vitals filed for this visit.  Examination: General appearance:  adult female, alert and in no acute distress.   HEENT: Bruised left eye.  No nasal deformity.  OP moist. Skin: Warm and dry.  no jaundice.  No suspicious rashes or lesions.  Bruising on face, arms. Cardiac: RRR, nl S1-S2, no murmurs appreciated.  Capillary refill is brisk.  JVP not visible.  No LE edema.  Radial pulses 2+ and symmetric. Respiratory: Normal respiratory rate and rhythm.  CTAB without rales or wheezes. Abdomen: Abdomen soft.  No TTP. No ascites, distension, hepatosplenomegaly.   MSK: No deformities or effusions. Neuro: Awake and alert.  EOMI, moves all extremities with  severe generalized weakness, I do not appreciate a consistent left sided hemiparesis other than expected given her left shoulder pain. Speech fluent.    Psych: Sensorium intact and responding to questions, attention normal. Affect normal.  Judgment and insight appear normal.    Data Reviewed: I have personally reviewed following labs and imaging studies:  CBC: Recent Labs  Lab 12/22/19 1806 12/23/19 0425  WBC 8.0 5.8  NEUTROABS 6.1  --   HGB 10.2* 8.1*  HCT 32.8* 25.9*  MCV 102.5* 103.2*  PLT 402* [id number]   Basic Metabolic Panel: Recent Labs  Lab 12/22/19 1806 12/23/19 0425  NA 131* 133*  K 4.7 4.6  CL 98 102  CO2 25 22  GLUCOSE 115* 103*  BUN 25* 22*  CREATININE 1.58* 1.31*   CALCIUM 9.4 8.1*   GFR: CrCl cannot be calculated (Unknown ideal weight.). Liver Function Tests: Recent Labs  Lab 12/22/19 1806  AST 43*  ALT 63*  ALKPHOS 50  BILITOT 0.5  PROT 6.6  ALBUMIN 4.1   No results for input(s): LIPASE, AMYLASE in the last 168 hours. No results for input(s): AMMONIA in the last 168 hours. Coagulation Profile: Recent Labs  Lab 12/22/19 1806 12/23/19 0425  INR 1.3* 1.3*   Cardiac Enzymes: No results for input(s): CKTOTAL, CKMB, CKMBINDEX, TROPONINI in the last 168 hours. BNP (last 3 results) Recent Labs    04/23/19 1233 06/12/19 1705 10/16/19 1020  PROBNP 3,353* 12,215* 449*   HbA1C: No results for input(s): HGBA1C in the last 72 hours. CBG: No results for input(s): GLUCAP in the last 168 hours. Lipid Profile: No results for input(s): CHOL, HDL, LDLCALC, TRIG, CHOLHDL, LDLDIRECT in the last 72 hours. Thyroid Function Tests: Recent Labs    12/23/19 1645  TSH 55.003*  FREET4 <0.25*   Anemia Panel: No results for input(s): VITAMINB12, FOLATE, FERRITIN, TIBC, IRON, RETICCTPCT in the last 72 hours. Urine analysis:    Component Value Date/Time   COLORURINE YELLOW 12/22/2019 2201   APPEARANCEUR CLEAR 12/22/2019 2201   LABSPEC 1.011 12/22/2019 2201   PHURINE 6.0 12/22/2019 2201   GLUCOSEU NEGATIVE 12/22/2019 2201   HGBUR NEGATIVE 12/22/2019 2201   BILIRUBINUR NEGATIVE 12/22/2019 2201   KETONESUR NEGATIVE 12/22/2019 2201   PROTEINUR NEGATIVE 12/22/2019 2201   NITRITE NEGATIVE 12/22/2019 2201   LEUKOCYTESUR NEGATIVE 12/22/2019 2201   Sepsis Labs: @LABRCNTIP (procalcitonin:4,lacticacidven:4)  ) Recent Results (from the past 240 hour(s))  Respiratory Panel by RT PCR (Flu A&B, Covid) - Nasopharyngeal Swab     Status: None   Collection Time: 12/22/19  6:22 PM   Specimen: Nasopharyngeal Swab  Result Value Ref Range Status   SARS Coronavirus 2 by RT PCR NEGATIVE NEGATIVE Final    Comment: (NOTE) SARS-CoV-2 target nucleic acids are  NOT DETECTED. The SARS-CoV-2 RNA is generally detectable in upper respiratoy specimens during the acute phase of infection. The lowest concentration of SARS-CoV-2 viral copies this assay can detect is 131 copies/mL. A negative result does not preclude SARS-Cov-2 infection and should not be used as the sole basis for treatment or other patient management decisions. A negative result may occur with  improper specimen collection/handling, submission of specimen other than nasopharyngeal swab, presence of viral mutation(s) within the areas targeted by this assay, and inadequate number of viral copies (<131 copies/mL). A negative result must be combined with clinical observations, patient history, and epidemiological information. The expected result is Negative. Fact Sheet for Patients:  PinkCheek.be Fact Sheet for Healthcare Providers:  GravelBags.it This test is not  yet ap proved or cleared by the [place] and  has been authorized for detection and/or diagnosis of SARS-CoV-2 by FDA under an Emergency Use Authorization (EUA). This EUA will remain  in effect (meaning this test can be used) for the duration of the COVID-19 declaration under Section 564(b)(1) of the Act, 21 U.S.C. section 360bbb-3(b)(1), unless the authorization is terminated or revoked sooner.    Influenza A by PCR NEGATIVE NEGATIVE Final   Influenza B by PCR NEGATIVE NEGATIVE Final    Comment: (NOTE) The Xpert Xpress SARS-CoV-2/FLU/RSV assay is intended as an aid in  the diagnosis of influenza from Nasopharyngeal swab specimens and  should not be used as a sole basis for treatment. Nasal washings and  aspirates are unacceptable for Xpert Xpress SARS-CoV-2/FLU/RSV  testing. Fact Sheet for Patients: PinkCheek.be Fact Sheet for Healthcare Providers: GravelBags.it This test is not yet approved or  cleared by the [place] FDA and  has been authorized for detection and/or diagnosis of SARS-CoV-2 by  FDA under an Emergency Use Authorization (EUA). This EUA will remain  in effect (meaning this test can be used) for the duration of the  Covid-19 declaration under Section 564(b)(1) of the Act, 21  U.S.C. section 360bbb-3(b)(1), unless the authorization is  terminated or revoked. Performed at [facility], [place] [address], [place], [place] [address]          Radiology Studies: DG Pelvis 1-2 Views  Result Date: 12/22/2019 CLINICAL DATA:  Fall with bilateral hip pain EXAM: PELVIS - 1-2 VIEW COMPARISON:  07/10/2019 FINDINGS: There is no evidence of pelvic fracture or diastasis. No pelvic bone lesions are seen. IMPRESSION: Negative. Electronically Signed   By: Donavan Foil M.D.   On: 12/22/2019 18:40   DG Knee 2 Views Left  Result Date: 12/22/2019 CLINICAL DATA:  Recent fall. Knee pain. EXAM: LEFT KNEE - 1-2 VIEW COMPARISON:  None. FINDINGS: The joint spaces are fairly well maintained. Slight lateral joint space narrowing and early spurring. Possible tiny avulsion fracture involving the lateral tibia. Difficult to be certain because there is significant clothing artifact. Suspect suprapatellar knee joint effusion. IMPRESSION: 1. Possible tiny avulsion fracture involving the lateral tibia. 2. Suspect suprapatellar knee joint effusion. 3. Mild lateral joint space narrowing and early spurring. Electronically Signed   By: Marijo Sanes M.D.   On: 12/22/2019 18:43   CT Head Wo Contrast  Result Date: 12/22/2019 CLINICAL DATA:  Multiple falls, scalp laceration, Eliquis EXAM: CT HEAD WITHOUT CONTRAST CT MAXILLOFACIAL WITHOUT CONTRAST CT CERVICAL SPINE WITHOUT CONTRAST TECHNIQUE: Multidetector CT imaging of the head, cervical spine, and maxillofacial structures were performed using the standard protocol without intravenous contrast. Multiplanar CT image reconstructions of the cervical spine and  maxillofacial structures were also generated. COMPARISON:  07/09/2019 FINDINGS: CT HEAD FINDINGS Brain: No evidence of acute infarction, hemorrhage, hydrocephalus, extra-axial collection or mass lesion/mass effect. Mild periventricular white matter hypodensity. Vascular: No hyperdense vessel or unexpected calcification. CT FACIAL BONES FINDINGS Skull: Normal. Negative for fracture or focal lesion. Facial bones: No displaced fractures or dislocations of the facial bones proper. Sinuses/Orbits: There is a hyperdense fluid level within the left maxillary sinus. Small, mildly displaced blowout fracture fragment of the left orbital floor, fracture fragment measuring approximately 5 mm x 9 mm with 4 mm displacement. There is a small amount of fat herniation without involvement of the inferior rectus. Other: Soft tissue contusion and laceration of the left cheek and eyelids. Right parietal scalp laceration. CT CERVICAL SPINE  FINDINGS Alignment: Normal. Skull base and vertebrae: No acute fracture. No primary bone lesion or focal pathologic process. Soft tissues and spinal canal: No prevertebral fluid or swelling. No visible canal hematoma. Disc levels:  Intact. Upper chest: Negative. Other: None. IMPRESSION: 1.  No acute intracranial pathology. 2. Small, mildly displaced blowout fracture fragment of the left orbital floor, fracture fragment measuring approximately 5 mm x 9 mm with 4 mm displacement. There is a small amount of fat herniation without involvement of the inferior rectus. 3. Soft tissue contusion and laceration of the left cheek and eyelids. 4.  Right parietal scalp laceration. 5.  No fracture or static subluxation of the cervical spine. Electronically Signed   By: Eddie Candle M.D.   On: 12/22/2019 19:43   CT Cervical Spine Wo Contrast  Result Date: 12/22/2019 CLINICAL DATA:  Multiple falls, scalp laceration, Eliquis EXAM: CT HEAD WITHOUT CONTRAST CT MAXILLOFACIAL WITHOUT CONTRAST CT CERVICAL SPINE WITHOUT  CONTRAST TECHNIQUE: Multidetector CT imaging of the head, cervical spine, and maxillofacial structures were performed using the standard protocol without intravenous contrast. Multiplanar CT image reconstructions of the cervical spine and maxillofacial structures were also generated. COMPARISON:  07/09/2019 FINDINGS: CT HEAD FINDINGS Brain: No evidence of acute infarction, hemorrhage, hydrocephalus, extra-axial collection or mass lesion/mass effect. Mild periventricular white matter hypodensity. Vascular: No hyperdense vessel or unexpected calcification. CT FACIAL BONES FINDINGS Skull: Normal. Negative for fracture or focal lesion. Facial bones: No displaced fractures or dislocations of the facial bones proper. Sinuses/Orbits: There is a hyperdense fluid level within the left maxillary sinus. Small, mildly displaced blowout fracture fragment of the left orbital floor, fracture fragment measuring approximately 5 mm x 9 mm with 4 mm displacement. There is a small amount of fat herniation without involvement of the inferior rectus. Other: Soft tissue contusion and laceration of the left cheek and eyelids. Right parietal scalp laceration. CT CERVICAL SPINE FINDINGS Alignment: Normal. Skull base and vertebrae: No acute fracture. No primary bone lesion or focal pathologic process. Soft tissues and spinal canal: No prevertebral fluid or swelling. No visible canal hematoma. Disc levels:  Intact. Upper chest: Negative. Other: None. IMPRESSION: 1.  No acute intracranial pathology. 2. Small, mildly displaced blowout fracture fragment of the left orbital floor, fracture fragment measuring approximately 5 mm x 9 mm with 4 mm displacement. There is a small amount of fat herniation without involvement of the inferior rectus. 3. Soft tissue contusion and laceration of the left cheek and eyelids. 4.  Right parietal scalp laceration. 5.  No fracture or static subluxation of the cervical spine. Electronically Signed   By: Eddie Candle  M.D.   On: 12/22/2019 19:43   DG Chest Port 1 View  Result Date: 12/22/2019 CLINICAL DATA:  Fall EXAM: PORTABLE CHEST 1 VIEW COMPARISON:  07/09/2019 FINDINGS: The heart size and mediastinal contours are within normal limits. Aortic atherosclerosis. Both lungs are clear. The visualized skeletal structures are unremarkable. IMPRESSION: No active disease. Electronically Signed   By: Donavan Foil M.D.   On: 12/22/2019 18:40   CT Maxillofacial WO CM  Result Date: 12/22/2019 CLINICAL DATA:  Multiple falls, scalp laceration, Eliquis EXAM: CT HEAD WITHOUT CONTRAST CT MAXILLOFACIAL WITHOUT CONTRAST CT CERVICAL SPINE WITHOUT CONTRAST TECHNIQUE: Multidetector CT imaging of the head, cervical spine, and maxillofacial structures were performed using the standard protocol without intravenous contrast. Multiplanar CT image reconstructions of the cervical spine and maxillofacial structures were also generated. COMPARISON:  07/09/2019 FINDINGS: CT HEAD FINDINGS Brain: No evidence of acute infarction,  hemorrhage, hydrocephalus, extra-axial collection or mass lesion/mass effect. Mild periventricular white matter hypodensity. Vascular: No hyperdense vessel or unexpected calcification. CT FACIAL BONES FINDINGS Skull: Normal. Negative for fracture or focal lesion. Facial bones: No displaced fractures or dislocations of the facial bones proper. Sinuses/Orbits: There is a hyperdense fluid level within the left maxillary sinus. Small, mildly displaced blowout fracture fragment of the left orbital floor, fracture fragment measuring approximately 5 mm x 9 mm with 4 mm displacement. There is a small amount of fat herniation without involvement of the inferior rectus. Other: Soft tissue contusion and laceration of the left cheek and eyelids. Right parietal scalp laceration. CT CERVICAL SPINE FINDINGS Alignment: Normal. Skull base and vertebrae: No acute fracture. No primary bone lesion or focal pathologic process. Soft tissues and spinal  canal: No prevertebral fluid or swelling. No visible canal hematoma. Disc levels:  Intact. Upper chest: Negative. Other: None. IMPRESSION: 1.  No acute intracranial pathology. 2. Small, mildly displaced blowout fracture fragment of the left orbital floor, fracture fragment measuring approximately 5 mm x 9 mm with 4 mm displacement. There is a small amount of fat herniation without involvement of the inferior rectus. 3. Soft tissue contusion and laceration of the left cheek and eyelids. 4.  Right parietal scalp laceration. 5.  No fracture or static subluxation of the cervical spine. Electronically Signed   By: Eddie Candle M.D.   On: 12/22/2019 19:43        Scheduled Meds: . amiodarone  200 mg Oral BID  . vitamin C  1,000 mg Oral Daily  . buPROPion  150 mg Oral Daily  . cholecalciferol  1,000 Units Oral Daily  . DULoxetine  60 mg Oral BID  . fenofibrate  54 mg Oral Daily  . ferrous sulfate  325 mg Oral Daily  . furosemide  40 mg Oral Daily  . loratadine  10 mg Oral Daily  . losartan  50 mg Oral Daily  . metoprolol succinate  100 mg Oral Daily  . pantoprazole  40 mg Oral BID  . sodium chloride flush  3 mL Intravenous Q12H   Continuous Infusions:   LOS: 0 days    Time spent: 25 minutes    Edwin Dada, MD [facility] 12/23/2019, 6:58 PM     Please page though [place] or Epic secure chat:  For [employer], Adult nurse

## 2019-12-23 NOTE — ED Notes (Signed)
Daughter Albina Billet would like an update [phone]

## 2019-12-23 NOTE — Progress Notes (Signed)
PT Cancellation Note  Patient Details Name: Brianna Porter MRN: [id number] DOB: 1961/01/28   Cancelled Treatment:    Reason Eval/Treat Not Completed: Medical issues which prohibited therapy Pt with new L lateral tibia avulsion fx. Awaiting clarification of weightbearing orders prior to initiating PT; sent MD secure chat and awaiting response. Will follow up as schedule allows.   Lou Miner, DPT  Acute Rehabilitation Services  Pager: [phone] Office: [phone]     Rudean Hitt 12/23/2019, 10:47 AM

## 2019-12-23 NOTE — Progress Notes (Addendum)
Pt stable on arrival to floor. Pt currently not in pain except for a little discomfort from facial trauma. On skin assessment pt have scatter briusing and red spots throughout her entire body d/t trauma and blood thinner she states. Pt has multiple skin tears to bilateral legs, arms, hands. ED place several band-aids over skin tears. Pt also has a large red area over left should, pt states she landed on the side when she fell. Pt face currently dressed with gauze and tape from ED, will redress the dressing if becomes saturated in drainage. Pt states her falls are due to generalized weakness. Placed a sacral foam due to a little blanchable redness on bottom. Heel foams also applied d/t blanchable redness. Pt A&Ox4, in good spirits. Will continue to monitor pt.

## 2019-12-23 NOTE — NC FL2 (Signed)
[place] LEVEL OF CARE SCREENING TOOL     IDENTIFICATION  Patient Name: Brianna Porter Birthdate: Jan 18, 1961 Sex: female Admission Date (Current Location): 12/22/2019  [facility] and [place] Number:  Herbalist and Address:  [facility], [place] [address] [place], [place] [address]      Provider Number: [id number]  Attending Physician Name and Address:  Edwin Dada, *  Relative Name and Phone Number:  Albina Billet [phone]    Current Level of Care: Hospital Recommended Level of Care: [place] Prior Approval Number:    Date Approved/Denied:   PASRR Number: Pending  Discharge Plan: SNF    Current Diagnoses: Patient Active Problem List   Diagnosis Date Noted  . Fall 12/23/2019  . Gastrointestinal hemorrhage   . Pressure injury of skin 07/09/2019  . Acute gastric ulcer with hemorrhage   . Symptomatic anemia 06/28/2019  . Elevated liver function tests   . NICM (nonischemic cardiomyopathy) ([place])   . Mild CAD   . Confusion   . Diabetes ([place])   . Iron deficiency anemia   . Hyperthyroidism   . Altered mental status 05/07/2019  . Acute systolic heart failure ([place]) 05/07/2019  . AKI (acute kidney injury) ([place]) 05/07/2019  . Hyponatremia   . CAD (coronary artery disease)   . Essential hypertension   . Hypokalemia   . Prolonged Q-T interval on ECG 04/30/2019  . Atrial fibrillation with rapid ventricular response ([place]) 04/29/2019  . Persistent atrial fibrillation ([place])   . Hyperlipidemia 04/23/2019  . Anemia 04/23/2019  . CKD (chronic kidney disease) stage 3, GFR 30-59 ml/min 04/23/2019  . Depression 03/02/2019  . Osteoarthritis 03/02/2019  . Type 2 diabetes mellitus with complication, without long-term current use of insulin ([place]) 03/02/2019  . Hypertensive heart disease 03/02/2019    Orientation RESPIRATION BLADDER Height & Weight     Self, Time, Situation, Place  Normal Continent Weight:    Height:     BEHAVIORAL SYMPTOMS/MOOD NEUROLOGICAL BOWEL NUTRITION STATUS      Continent Diet(Thin due to injury)  AMBULATORY STATUS COMMUNICATION OF NEEDS Skin   Extensive Assist Verbally Other (Comment)(ecchymosis)                       Personal Care Assistance Level of Assistance  Bathing, Feeding, Dressing Bathing Assistance: Limited assistance Feeding assistance: Independent Dressing Assistance: Limited assistance     Functional Limitations Info  Sight, Hearing, Speech Sight Info: Adequate Hearing Info: Adequate Speech Info: Adequate    SPECIAL CARE FACTORS FREQUENCY  PT (By licensed PT), OT (By licensed OT)     PT Frequency: 5x weekly OT Frequency: 5x weekly            Contractures Contractures Info: Not present    Additional Factors Info  Code Status, Allergies Code Status Info: Full Allergies Info: Ambien (Zolpidem Tartrate) ,Adhesive Tape,Codeine           Current Medications (12/23/2019):  This is the current hospital active medication list Current Facility-Administered Medications  Medication Dose Route Frequency Provider Last Rate Last Admin  . acetaminophen (TYLENOL) tablet 650 mg  650 mg Oral Q6H PRN Truddie Hidden, MD   650 mg at 12/23/19 1528  . albuterol (VENTOLIN HFA) 108 (90 Base) MCG/ACT inhaler 2 puff  2 puff Inhalation Q6H PRN Truddie Hidden, MD      . amiodarone (PACERONE) tablet 200 mg  200 mg Oral BID Truddie Hidden, MD   200  mg at 12/23/19 0900  . ascorbic acid (VITAMIN C) tablet 1,000 mg  1,000 mg Oral Daily Truddie Hidden, MD   1,000 mg at 12/23/19 0900  . buPROPion (WELLBUTRIN XL) 24 hr tablet 150 mg  150 mg Oral Daily Truddie Hidden, MD   150 mg at 12/23/19 0900  . cholecalciferol (VITAMIN D3) tablet 1,000 Units  1,000 Units Oral Daily Truddie Hidden, MD   1,000 Units at 12/23/19 0900  . DULoxetine (CYMBALTA) DR capsule 60 mg  60 mg Oral BID Truddie Hidden, MD   60 mg at 12/23/19 0900  . fenofibrate tablet 54 mg  54  mg Oral Daily Truddie Hidden, MD   54 mg at 12/23/19 1035  . ferrous sulfate tablet 325 mg  325 mg Oral Daily Truddie Hidden, MD   325 mg at 12/23/19 0900  . furosemide (LASIX) tablet 40 mg  40 mg Oral Daily Truddie Hidden, MD   40 mg at 12/23/19 0900  . loratadine (CLARITIN) tablet 10 mg  10 mg Oral Daily Truddie Hidden, MD   10 mg at 12/23/19 0900  . losartan (COZAAR) tablet 50 mg  50 mg Oral Daily Truddie Hidden, MD   50 mg at 12/23/19 0900  . metoprolol succinate (TOPROL-XL) 24 hr tablet 100 mg  100 mg Oral Daily Truddie Hidden, MD   100 mg at 12/23/19 0900  . Muscle Rub CREA   Topical TID PRN Truddie Hidden, MD      . pantoprazole (PROTONIX) EC tablet 40 mg  40 mg Oral BID Truddie Hidden, MD   40 mg at 12/23/19 0900  . polyethylene glycol (MIRALAX / GLYCOLAX) packet 17 g  17 g Oral QHS PRN Truddie Hidden, MD      . sodium chloride flush (NS) 0.9 % injection 3 mL  3 mL Intravenous Q12H Truddie Hidden, MD   3 mL at 12/23/19 [id number]   Current Outpatient Medications  Medication Sig Dispense Refill  . acetaminophen (TYLENOL) 325 MG tablet Take 650 mg by mouth every 6 (six) hours as needed for mild pain.    Marland Kitchen albuterol (VENTOLIN HFA) 108 (90 Base) MCG/ACT inhaler Inhale 2 puffs into the lungs every 6 (six) hours as needed for wheezing or shortness of breath.    . ALPRAZolam (XANAX) 0.5 MG tablet Take 0.5-1 mg by mouth See admin instructions. Take 0.5-1 mg tablet by mouth two times a day (Midday and bedtime)    . amiodarone (PACERONE) 200 MG tablet Take 1 tablet (200 mg total) by mouth daily. (Patient taking differently: Take 200 mg by mouth 2 (two) times daily. ) 90 tablet 1  . apixaban (ELIQUIS) 5 MG TABS tablet Take 1 tablet (5 mg total) by mouth 2 (two) times daily. 60 tablet 5  . Ascorbic Acid (VITAMIN C) 1000 MG tablet Take 1,000 mg by mouth daily.    Marland Kitchen buPROPion (WELLBUTRIN XL) 150 MG 24 hr tablet Take 150 mg by mouth daily.    . cetirizine (ZYRTEC) 10 MG tablet Take 10 mg  by mouth daily as needed for allergies.    . cholecalciferol (VITAMIN D3) 25 MCG (1000 UNIT) tablet Take 1,000 Units by mouth daily.    . DULoxetine (CYMBALTA) 60 MG capsule Take 60 mg by mouth 2 (two) times daily.    . fenofibrate (TRICOR) 48 MG tablet Take 48 mg by mouth at bedtime.    . ferrous sulfate 325 (65 FE) MG tablet Take 325 mg by mouth daily.    . furosemide (LASIX) 40  MG tablet Take 40 mg by mouth daily.     Marland Kitchen losartan (COZAAR) 50 MG tablet Take 50 mg by mouth daily.    . Menthol, Topical Analgesic, (ICY HOT ADVANCED RELIEF EX) Apply 1 application topically 3 (three) times daily as needed (pain).     . methimazole (TAPAZOLE) 5 MG tablet Take 5 mg by mouth at bedtime.    . metoprolol succinate (TOPROL-XL) 100 MG 24 hr tablet Take 1 tablet (100 mg total) by mouth daily. Take with or immediately following a meal. 90 tablet 1  . pantoprazole (PROTONIX) 40 MG tablet Take 40 mg by mouth 2 (two) times daily.     . polyethylene glycol (MIRALAX / GLYCOLAX) 17 g packet Take 17 g by mouth at bedtime as needed for moderate constipation.    . traMADol (ULTRAM) 50 MG tablet Take 50 mg by mouth 4 (four) times daily as needed for pain.       Discharge Medications: Please see discharge summary for a list of discharge medications.  Relevant Imaging Results:  Relevant Lab Results:   Additional Information SSN [id number]  Marvie Brevik, LCSW

## 2019-12-23 NOTE — Evaluation (Signed)
Physical Therapy Evaluation Patient Details Name: Brianna Porter MRN: [id number] DOB: November 12, 1960 Today's Date: 12/23/2019   History of Present Illness  59 y.o. female with medical history significant of systolic heart failure, CKD, A. fib/flutter on apixaban, hyperthyroidism, gastric ulcers, and CAD who presents after 2 ground-level falls within 24 hours. Found to have L tibial avulsion fx and L orbital floor fx.   Clinical Impression  Pt admitted secondary to problem above with deficits below. Pt requiring min to heavy min/light mod A +2 to ambulate short distance. Pt very weak and with increased instability in bilateral LEs. Pt with more than 10 falls in the past 3-6 months. Feel pt would benefit from SNF level therapies prior to d/c home. Will continue to follow acutely to maximize functional mobility independence and safety.     Follow Up Recommendations SNF;Supervision/Assistance - 24 hour    Equipment Recommendations  Wheelchair (measurements PT);Wheelchair cushion (measurements PT)    Recommendations for Other Services       Precautions / Restrictions Precautions Precautions: Fall Restrictions Weight Bearing Restrictions: No Other Position/Activity Restrictions: WBAT on LLE per MD      Mobility  Bed Mobility Overal bed mobility: Needs Assistance Bed Mobility: Supine to Sit;Sit to Supine     Supine to sit: Min assist Sit to supine: Min assist   General bed mobility comments: Min A for trunk assist and LLE assist. Increased time required.   Transfers Overall transfer level: Needs assistance Equipment used: Rolling walker (2 wheeled) Transfers: Sit to/from Stand Sit to Stand: Min assist;+2 safety/equipment;+2 physical assistance         General transfer comment: Min A +2 for steadying assist to stand from stretcher.   Ambulation/Gait Ambulation/Gait assistance: Min assist;+2 physical assistance;+2 safety/equipment;Mod assist Gait Distance (Feet): 50  Feet Assistive device: Rolling walker (2 wheeled) Gait Pattern/deviations: Step-to pattern;Step-through pattern;Decreased step length - right;Decreased step length - left;Antalgic Gait velocity: Decreased   General Gait Details: Pt with increased knee instability bilaterally. Noted slight buckling on LLE even with KI. Difficulty with bilateral LE advancement with increased distance. Required min +2 to heavy min/light mod A +2 with increased distance secondary to instability.   Stairs            Wheelchair Mobility    Modified Rankin (Stroke Patients Only)       Balance Overall balance assessment: Needs assistance Sitting-balance support: No upper extremity supported Sitting balance-Leahy Scale: Fair     Standing balance support: Bilateral upper extremity supported;During functional activity Standing balance-Leahy Scale: Poor Standing balance comment: Reliant on BUE support and external support                              Pertinent Vitals/Pain Pain Assessment: 0-10 Pain Score: 10-Worst pain ever Pain Location: shoulder;face; LLE Pain Descriptors / Indicators: Discomfort;Grimacing;Guarding Pain Intervention(s): Limited activity within patient's tolerance;Monitored during session;Repositioned    Home Living Family/patient expects to be discharged to:: Private residence Living Arrangements: Children;Other relatives Available Help at Discharge: Family;Friend(s);Available 24 hours/day Type of Home: House Home Access: Stairs to enter Entrance Stairs-Rails: Psychiatric nurse of Steps: 2 Home Layout: One level Home Equipment: Walker - 2 wheels;Cane - single point;Bedside commode;Shower seat      Prior Function Level of Independence: Independent with assistive device(s);Needs assistance   Gait / Transfers Assistance Needed: Used a cane adn then recently (past week) has been using a RW  ADL's / Homemaking Assistance Needed: daughter assist  with  washing bath        Hand Dominance   Dominant Hand: Right    Extremity/Trunk Assessment   Upper Extremity Assessment Upper Extremity Assessment: Defer to OT evaluation    Lower Extremity Assessment Lower Extremity Assessment: LLE deficits/detail;Generalized weakness LLE Deficits / Details: Increased pain at L knee. Functional weakness and instability noted during ambulation.     Cervical / Trunk Assessment Cervical / Trunk Assessment: Normal  Communication   Communication: No difficulties  Cognition Arousal/Alertness: Awake/alert Behavior During Therapy: WFL for tasks assessed/performed Overall Cognitive Status: No family/caregiver present to determine baseline cognitive functioning                                 General Comments: Decreased safety awareness with poor insight to deficits.       General Comments General comments (skin integrity, edema, etc.): Lengthy discussion about benefits of SNF.     Exercises     Assessment/Plan    PT Assessment Patient needs continued PT services  PT Problem List Decreased strength;Decreased balance;Decreased mobility;Decreased safety awareness;Decreased knowledge of use of DME;Decreased knowledge of precautions;Pain       PT Treatment Interventions DME instruction;Gait training;Therapeutic activities;Functional mobility training;Stair training;Therapeutic exercise;Balance training;Patient/family education    PT Goals (Current goals can be found in the Care Plan section)  Acute Rehab PT Goals Patient Stated Goal: to figure out why she is falling PT Goal Formulation: With patient Time For Goal Achievement: 01/06/20 Potential to Achieve Goals: Good    Frequency Min 3X/week   Barriers to discharge        Co-evaluation PT/OT/SLP Co-Evaluation/Treatment: Yes Reason for Co-Treatment: Complexity of the patient's impairments (multi-system involvement);To address functional/ADL transfers;For patient/therapist  safety PT goals addressed during session: Mobility/safety with mobility;Balance;Proper use of DME         AM-PAC PT "6 Clicks" Mobility  Outcome Measure Help needed turning from your back to your side while in a flat bed without using bedrails?: A Little Help needed moving from lying on your back to sitting on the side of a flat bed without using bedrails?: A Little Help needed moving to and from a bed to a chair (including a wheelchair)?: A Little Help needed standing up from a chair using your arms (e.g., wheelchair or bedside chair)?: A Little Help needed to walk in hospital room?: A Lot Help needed climbing 3-5 steps with a railing? : Total 6 Click Score: 15    End of Session Equipment Utilized During Treatment: Gait belt Activity Tolerance: Patient limited by fatigue Patient left: in bed;with call bell/phone within reach Nurse Communication: Mobility status PT Visit Diagnosis: History of falling (Z91.81);Repeated falls (R29.6);Muscle weakness (generalized) (M62.81);Unsteadiness on feet (R26.81)    Time: [id number] PT Time Calculation (min) (ACUTE ONLY): 44 min   Charges:   PT Evaluation $PT Eval Moderate Complexity: 1 Mod PT Treatments $Gait Training: 8-22 mins        Lou Miner, DPT  Acute Rehabilitation Services  Pager: [phone] Office: [phone]   Rudean Hitt 12/23/2019, 1:52 PM

## 2019-12-23 NOTE — ED Notes (Signed)
Date and time results received: 12/23/19 (use smartphrase ".now" to insert current time)  Test: Hg Critical Value:8.1  Name of Provider Notified: [place]

## 2019-12-23 NOTE — Social Work (Signed)
CSW met with Pt at bedside. Pt has decided to go to SNF per recommendation of PT and OT. Per Pt request, CSW called daughter, Albina Billet @ [phone] to inform daughter of Pts decision.  CSW will begin SNF process.

## 2019-12-23 NOTE — Progress Notes (Signed)
Occupational Therapy Evaluation  PTA, pt living with her daughter and has been using a RW for mobility for the past week due to multiple falls. Pt states she falls very easily and her legs also "give out on her". Pt with multiple bruises and skin tears. Required Mod A +2 at times during mobility due to fatigue and weakness and trying to ambulate with L KI. Requires Mod A with LB ADL. Pt has fallen at least 10 times in the past 3 months and is higher risk for falls now given her current status and having to use the L KI. Pt became tearful during session asking "why am I like this"? Pt will benefit from rehab at SNF to maximize independence with ADL and mobility to facilitate safe DC home. Will follow acutely.    12/23/19 1400  OT Visit Information  Last OT Received On 12/23/19  Assistance Needed +2  PT/OT/SLP Co-Evaluation/Treatment Yes  Reason for Co-Treatment Complexity of the patient's impairments (multi-system involvement);For patient/therapist safety;To address functional/ADL transfers  OT goals addressed during session ADL's and self-care  History of Present Illness 59 y.o. female with medical history significant of systolic heart failure, CKD, A. fib/flutter on apixaban, hyperthyroidism, gastric ulcers, and CAD who presents after 2 ground-level falls within 24 hours. Found to have L tibial avulsion fx and L orbital floor fx.   Precautions  Precautions Fall  Restrictions  Weight Bearing Restrictions No  Other Position/Activity Restrictions WBAT on LLE per MD  Home Living  Family/patient expects to be discharged to: Private residence  Living Arrangements Children;Other relatives  Available Help at Discharge Family;Friend(s);Available 24 hours/day  Type of Home House  Home Access Stairs to enter  Entrance Stairs-Number of Steps 2  Entrance Stairs-Rails Right;Left  Home Layout One level  Bathroom Shower/Tub Tub/shower unit;Curtain  Corporate treasurer Yes   How Accessible Accessible via walker  [place] - 2 wheels;Cane - single point;BSC;Shower seat  Prior Function  Level of Independence Independent with assistive device(s);Needs assistance  Gait / Transfers Assistance Needed Used a cane adn then recently (past week) has been using a RW  ADL's / Homemaking Assistance Needed daughter assists with washing back  Comments Has required more assistance over last month due to weakness/falls  Communication  Communication No difficulties  Pain Assessment  Pain Assessment 0-10  Pain Score 10  Pain Location shoulder;face; LLE  Pain Descriptors / Indicators Discomfort;Grimacing;Guarding  Pain Intervention(s) Limited activity within patient's tolerance  Cognition  Arousal/Alertness Awake/alert  Behavior During Therapy WFL for tasks assessed/performed  Overall Cognitive Status No family/caregiver present to determine baseline cognitive functioning  General Comments Decreased safety awareness with poor insight to deficits.   Upper Extremity Assessment  Upper Extremity Assessment LUE deficits/detail  LUE Deficits / Details complaining of L shoulder pain, especially with palpation over anterior aspect of shoulder; generalized weakness; noted incoordination - difficulty with finger to nose adn tying to reach gown and continued to demosntrate inability to grasp gown   LUE Coordination decreased fine motor;decreased gross motor  Lower Extremity Assessment  Lower Extremity Assessment Defer to PT evaluation  LLE Deficits / Details Increased pain at L knee. Functional weakness and instability noted during ambulation.   Cervical / Trunk Assessment  Cervical / Trunk Assessment Normal  ADL  Overall ADL's  Needs assistance/impaired  Eating/Feeding Set up  Grooming Set up;Sitting  Upper Body Bathing Set up;Sitting  Lower Body Bathing Moderate assistance;Sit to/from stand  Upper Body Dressing  Minimal assistance;Sitting  Lower Body Dressing  Moderate assistance;Sit to/from stand  Toilet Transfer Moderate assistance;Ambulation  Toileting- Clothing Manipulation and Hygiene Minimal assistance;Sit to/from stand;Sitting/lateral lean  Functional mobility during ADLs Moderate assistance;+2 for physical assistance (at times)  General ADL Comments Pt initially required Mod A at times due to balance deficits and posterior lean; Required +2 Mod A after ambulating @ 50 ft due to fatigue; L knee buckling in KI.   Vision- History  Baseline Vision/History Wears glasses  Wears Glasses At all times  Vision- Assessment  Vision Assessment? Yes  Eye Alignment [facility]  Ocular Range of Motion Impaired-to be further tested in functional context  Saccades [facility]  Additional Comments complaining of some "double vision up close". Most likely related to orbital fx  Bed Mobility  Overal bed mobility Needs Assistance  Bed Mobility Supine to Sit;Sit to Supine  Supine to sit Min assist  Sit to supine Min assist  General bed mobility comments Min A for trunk assist and LLE assist. Increased time required.   Transfers  Overall transfer level Needs assistance  Equipment used Rolling walker (2 wheeled)  Transfers Sit to/from Stand  Sit to Stand Min assist;+2 safety/equipment;+2 physical assistance  General transfer comment Min A +2 for steadying assist to stand from stretcher.   Balance  Overall balance assessment Needs assistance;History of Falls  Sitting-balance support No upper extremity supported  Sitting balance-Leahy Scale Fair  Standing balance support Bilateral upper extremity supported;During functional activity  Standing balance-Leahy Scale Poor  Standing balance comment Reliant on BUE support and external support   General Comments  General comments (skin integrity, edema, etc.) Pt began crying and stating "why am I like this? I'm too young for this". Lengthy discussion about benefits of rehab at [facility].   OT - End of Session  Equipment Utilized  During Treatment Gait belt;Rolling walker  Activity Tolerance Patient tolerated treatment well  Patient left in bed;with call bell/phone within reach  Nurse Communication Mobility status;Other (comment) (DC needs)  OT Assessment  OT Recommendation/Assessment Patient needs continued OT Services  OT Visit Diagnosis Unsteadiness on feet (R26.81);Other abnormalities of gait and mobility (R26.89);Repeated falls (R29.6);Muscle weakness (generalized) (M62.81);History of falling (Z91.81);Pain  Pain - Right/Left Left  Pain - part of body Leg;Knee;Shoulder (face)  OT Problem List Decreased strength;Decreased range of motion;Decreased activity tolerance;Impaired balance (sitting and/or standing);Impaired vision/perception;Decreased coordination;Decreased safety awareness;Decreased knowledge of use of DME or AE;Impaired sensation;Impaired UE functional use;Pain;Increased edema  OT Plan  OT Frequency (ACUTE ONLY) Min 2X/week  OT Treatment/Interventions (ACUTE ONLY) Self-care/ADL training;Therapeutic exercise;DME and/or AE instruction;Therapeutic activities;Cognitive remediation/compensation;Visual/perceptual remediation/compensation;Patient/family education;Balance training  AM-PAC OT "6 Clicks" Daily Activity Outcome Measure (Version 2)  Help from another person eating meals? 3  Help from another person taking care of personal grooming? 3  Help from another person toileting, which includes using toliet, bedpan, or urinal? 2  Help from another person bathing (including washing, rinsing, drying)? 2  Help from another person to put on and taking off regular upper body clothing? 3  Help from another person to put on and taking off regular lower body clothing? 2  6 Click Score 15  OT Recommendation  Follow Up Recommendations SNF;Supervision/Assistance - 24 hour  OT Equipment None recommended by OT  Individuals Consulted  Consulted and Agree with Results and Recommendations Patient  Acute Rehab OT Goals   Patient Stated Goal to figure out why she is falling  OT Goal Formulation With patient  Time For Goal Achievement 01/06/20  Potential to Achieve Goals Good  OT Time Calculation  OT Start Time (ACUTE ONLY) 1231  OT Stop Time (ACUTE ONLY) 1315  OT Time Calculation (min) 44 min  OT General Charges  $OT Visit 1 Visit  OT Evaluation  $OT Eval Moderate Complexity 1 Mod  Written Expression  Dominant Hand Right  Maurie Boettcher, OT/L   Acute OT Clinical Specialist Acute Rehabilitation Services Pager [phone] Office [phone]

## 2019-12-23 NOTE — TOC Initial Note (Signed)
Transition of Care [facility]) - Initial/Assessment Note    Patient Details  Name: Brianna Porter MRN: [id number] Date of Birth: [birth year]  Transition of Care [facility]) CM/SW Contact:    Vergie Living, LCSW Phone Number: 12/23/2019, 6:25 PM  Clinical Narrative:  Pt is pleasant although pained. A&Ox4 and accepting the recommendation of PT/OT that she go to a SNF for rehab in order to regain her strength to prevent further falls.                 Expected Discharge Plan: Skilled Nursing Facility Barriers to Discharge: [facility])   Patient Goals and CMS Choice Patient states their goals for this hospitalization and ongoing recovery are:: I need to get stronger and walk again CMS Medicare.gov Compare Post Acute Care list provided to:: Patient Choice offered to / list presented to : Patient, Adult Children  Expected Discharge Plan and Services Expected Discharge Plan: [place] Choice: [place] Living arrangements for the past 2 months: Single Family Home                                      Prior Living Arrangements/Services Living arrangements for the past 2 months: Single Family Home Lives with:: Adult Children, Self Patient language and need for interpreter reviewed:: No(No interpreter needed)        Need for Family Participation in Patient Care: Yes (Comment)(Pt lives with daughter) Care giver support system in place?: Yes (comment)(Pt lives with daughter)   Criminal Activity/Legal Involvement Pertinent to Current Situation/Hospitalization: No - Comment as needed  Activities of Daily Living      Permission Sought/Granted Permission sought to share information with : Facility Art therapist granted to share information with : Yes, Verbal Permission Granted  Share Information with NAME: Cranford Mon     Permission granted to share info w Relationship: Daughter  Permission  granted to share info w Contact Information: [phone]  Emotional Assessment Appearance:: Appears older than stated age Attitude/Demeanor/Rapport: Engaged Affect (typically observed): Pleasant Orientation: : Oriented to Self, Oriented to Place, Oriented to  Time, Oriented to Situation Alcohol / Substance Use: Not Applicable Psych Involvement: No (comment)  Admission diagnosis:  Fall [W19.XXXA] Patient Active Problem List   Diagnosis Date Noted  . Fall 12/23/2019  . Gastrointestinal hemorrhage   . Pressure injury of skin 07/09/2019  . Acute gastric ulcer with hemorrhage   . Symptomatic anemia 06/28/2019  . Elevated liver function tests   . NICM (nonischemic cardiomyopathy) ([place])   . Mild CAD   . Confusion   . Diabetes ([place])   . Iron deficiency anemia   . Hyperthyroidism   . Altered mental status 05/07/2019  . Acute systolic heart failure ([place]) 05/07/2019  . AKI (acute kidney injury) ([place]) 05/07/2019  . Hyponatremia   . CAD (coronary artery disease)   . Essential hypertension   . Hypokalemia   . Prolonged Q-T interval on ECG 04/30/2019  . Atrial fibrillation with rapid ventricular response ([place]) 04/29/2019  . Persistent atrial fibrillation ([place])   . Hyperlipidemia 04/23/2019  . Anemia 04/23/2019  . CKD (chronic kidney disease) stage 3, GFR 30-59 ml/min 04/23/2019  . Depression 03/02/2019  . Osteoarthritis 03/02/2019  . Type 2 diabetes mellitus with complication, without long-term current use of insulin ([place]) 03/02/2019  . Hypertensive heart disease 03/02/2019   PCP:  Truman Hayward,  Joylene Igo, MD Pharmacy:   [facility] [address], [place] [address] Phone: [phone] Fax: [phone]  [facility] [place], [place] - [address] [place] [address] Phone: [phone] Fax: [place], [place] - [place] [address] [place] [address] Phone: [phone] Fax: [place], [place] - [place] [address] Phone: [phone] Fax: [phone]     Social Determinants of Health ([place]) Interventions    Readmission Risk Interventions Readmission Risk Prevention Plan 07/13/2019  Transportation Screening Complete  PCP or Specialist Appt within 3-5 Days Complete  HRI or [place] Complete  Social Work Consult for [place] Planning/Counseling Complete  Palliative Care Screening Not Applicable  Medication Review Press photographer) Complete

## 2019-12-23 NOTE — Plan of Care (Signed)

## 2019-12-24 DIAGNOSIS — K219 Gastro-esophageal reflux disease without esophagitis: Secondary | ICD-10-CM | POA: Diagnosis present

## 2019-12-24 DIAGNOSIS — S0285XA Fracture of orbit, unspecified, initial encounter for closed fracture: Secondary | ICD-10-CM

## 2019-12-24 DIAGNOSIS — S0232XD Fracture of orbital floor, left side, subsequent encounter for fracture with routine healing: Secondary | ICD-10-CM | POA: Diagnosis not present

## 2019-12-24 DIAGNOSIS — E1122 Type 2 diabetes mellitus with diabetic chronic kidney disease: Secondary | ICD-10-CM | POA: Diagnosis not present

## 2019-12-24 DIAGNOSIS — E781 Pure hyperglyceridemia: Secondary | ICD-10-CM | POA: Diagnosis present

## 2019-12-24 DIAGNOSIS — I2510 Atherosclerotic heart disease of native coronary artery without angina pectoris: Secondary | ICD-10-CM | POA: Diagnosis present

## 2019-12-24 DIAGNOSIS — R531 Weakness: Secondary | ICD-10-CM | POA: Diagnosis present

## 2019-12-24 DIAGNOSIS — M2550 Pain in unspecified joint: Secondary | ICD-10-CM | POA: Diagnosis not present

## 2019-12-24 DIAGNOSIS — I482 Chronic atrial fibrillation, unspecified: Secondary | ICD-10-CM | POA: Diagnosis not present

## 2019-12-24 DIAGNOSIS — W19XXXS Unspecified fall, sequela: Secondary | ICD-10-CM

## 2019-12-24 DIAGNOSIS — D539 Nutritional anemia, unspecified: Secondary | ICD-10-CM | POA: Diagnosis present

## 2019-12-24 DIAGNOSIS — Z7401 Bed confinement status: Secondary | ICD-10-CM | POA: Diagnosis not present

## 2019-12-24 DIAGNOSIS — S01412A Laceration without foreign body of left cheek and temporomandibular area, initial encounter: Secondary | ICD-10-CM | POA: Diagnosis present

## 2019-12-24 DIAGNOSIS — E0590 Thyrotoxicosis, unspecified without thyrotoxic crisis or storm: Secondary | ICD-10-CM | POA: Diagnosis present

## 2019-12-24 DIAGNOSIS — N1831 Chronic kidney disease, stage 3a: Secondary | ICD-10-CM | POA: Diagnosis not present

## 2019-12-24 DIAGNOSIS — Z79899 Other long term (current) drug therapy: Secondary | ICD-10-CM | POA: Diagnosis not present

## 2019-12-24 DIAGNOSIS — Y92009 Unspecified place in unspecified non-institutional (private) residence as the place of occurrence of the external cause: Secondary | ICD-10-CM | POA: Diagnosis not present

## 2019-12-24 DIAGNOSIS — S82122D Displaced fracture of lateral condyle of left tibia, subsequent encounter for closed fracture with routine healing: Secondary | ICD-10-CM | POA: Diagnosis not present

## 2019-12-24 DIAGNOSIS — W1830XA Fall on same level, unspecified, initial encounter: Secondary | ICD-10-CM | POA: Diagnosis not present

## 2019-12-24 DIAGNOSIS — Z7901 Long term (current) use of anticoagulants: Secondary | ICD-10-CM | POA: Diagnosis not present

## 2019-12-24 DIAGNOSIS — E871 Hypo-osmolality and hyponatremia: Secondary | ICD-10-CM | POA: Diagnosis not present

## 2019-12-24 DIAGNOSIS — I428 Other cardiomyopathies: Secondary | ICD-10-CM | POA: Diagnosis not present

## 2019-12-24 DIAGNOSIS — W19XXXA Unspecified fall, initial encounter: Secondary | ICD-10-CM | POA: Diagnosis not present

## 2019-12-24 DIAGNOSIS — E7800 Pure hypercholesterolemia, unspecified: Secondary | ICD-10-CM | POA: Diagnosis present

## 2019-12-24 DIAGNOSIS — I4819 Other persistent atrial fibrillation: Secondary | ICD-10-CM | POA: Diagnosis not present

## 2019-12-24 DIAGNOSIS — S82152A Displaced fracture of left tibial tuberosity, initial encounter for closed fracture: Secondary | ICD-10-CM | POA: Diagnosis not present

## 2019-12-24 DIAGNOSIS — I5022 Chronic systolic (congestive) heart failure: Secondary | ICD-10-CM | POA: Diagnosis not present

## 2019-12-24 DIAGNOSIS — Z20822 Contact with and (suspected) exposure to covid-19: Secondary | ICD-10-CM | POA: Diagnosis not present

## 2019-12-24 DIAGNOSIS — I130 Hypertensive heart and chronic kidney disease with heart failure and stage 1 through stage 4 chronic kidney disease, or unspecified chronic kidney disease: Secondary | ICD-10-CM | POA: Diagnosis not present

## 2019-12-24 DIAGNOSIS — E0580 Other thyrotoxicosis without thyrotoxic crisis or storm: Secondary | ICD-10-CM | POA: Diagnosis not present

## 2019-12-24 DIAGNOSIS — Z9181 History of falling: Secondary | ICD-10-CM | POA: Diagnosis not present

## 2019-12-24 DIAGNOSIS — S0101XA Laceration without foreign body of scalp, initial encounter: Secondary | ICD-10-CM | POA: Diagnosis present

## 2019-12-24 DIAGNOSIS — S0232XA Fracture of orbital floor, left side, initial encounter for closed fracture: Secondary | ICD-10-CM | POA: Diagnosis not present

## 2019-12-24 DIAGNOSIS — Z87891 Personal history of nicotine dependence: Secondary | ICD-10-CM | POA: Diagnosis not present

## 2019-12-24 DIAGNOSIS — N179 Acute kidney failure, unspecified: Secondary | ICD-10-CM | POA: Diagnosis not present

## 2019-12-24 DIAGNOSIS — F329 Major depressive disorder, single episode, unspecified: Secondary | ICD-10-CM | POA: Diagnosis present

## 2019-12-24 LAB — T3: T3, Total: <20 ng/dL — ABNORMAL LOW (ref 71–180)

## 2019-12-24 LAB — BASIC METABOLIC PANEL
Anion gap: 11 (ref 5–15)
BUN: 27 mg/dL — ABNORMAL HIGH (ref 6–20)
CO2: 24 mmol/L (ref 22–32)
Calcium: 9.6 mg/dL (ref 8.9–10.3)
Chloride: 98 mmol/L (ref 98–111)
Creatinine, Ser: 2.03 mg/dL — ABNORMAL HIGH (ref 0.44–1.00)
GFR calc Af Amer: 31 mL/min — ABNORMAL LOW (ref >60)
GFR calc non Af Amer: 26 mL/min — ABNORMAL LOW (ref >60)
Glucose, Bld: 128 mg/dL — ABNORMAL HIGH (ref 70–99)
Potassium: 3.8 mmol/L (ref 3.5–5.1)
Sodium: 133 mmol/L — ABNORMAL LOW (ref 135–145)

## 2019-12-24 LAB — CBC
HCT: 27.9 % — ABNORMAL LOW (ref 36.0–46.0)
Hemoglobin: 9 g/dL — ABNORMAL LOW (ref 12.0–15.0)
MCH: 32.1 pg (ref 26.0–34.0)
MCHC: 32.3 g/dL (ref 30.0–36.0)
MCV: 99.6 fL (ref 80.0–100.0)
Platelets: 376 10*3/uL (ref 150–400)
RBC: 2.8 MIL/uL — ABNORMAL LOW (ref 3.87–5.11)
RDW: 14.2 % (ref 11.5–15.5)
WBC: 6.9 10*3/uL (ref 4.0–10.5)
nRBC: 0.3 % — ABNORMAL HIGH (ref 0.0–0.2)

## 2019-12-24 MED ORDER — SODIUM CHLORIDE 0.9 % IV SOLN: INTRAVENOUS | Status: AC

## 2019-12-24 MED ORDER — APIXABAN 5 MG PO TABS
5.0000 mg | ORAL_TABLET | Freq: Two times a day (BID) | ORAL | Status: DC
  Filled 2019-12-24: qty 1

## 2019-12-24 MED ORDER — SODIUM CHLORIDE 0.9 % IV SOLN: INTRAVENOUS | Status: DC

## 2019-12-24 MED ORDER — APIXABAN 5 MG PO TABS
5.0000 mg | ORAL_TABLET | Freq: Two times a day (BID) | ORAL | Status: DC
  Administered 2019-12-25 – 2019-12-30 (×11): 5 mg via ORAL
  Filled 2019-12-24 (×11): qty 1

## 2019-12-24 NOTE — TOC Initial Note (Signed)
Transition of Care [facility]) - Initial/Assessment Note    Patient Details  Name: Brianna Porter MRN: [id number] Date of Birth: 08-01-1961  Transition of Care [facility]) CM/SW Contact:    Curlene Labrum, RN Phone Number: 12/24/2019, 11:48 AM  Clinical Narrative:                 Case management met with the patient at the bedside to discuss SNF placement bed offers and provide medicare choice to the patient.  The patient was left with a list of accepting facilities to discuss with the daughter.  PASSR level 2 pending and will continue to followup.  [facility] called and [facility] is not in network with the facility.  Patient lives with her daughter and grandchildren. Patient is S/P fall and left cheek injury after fall on ground level due to imbalance and dizziness.  Patient educated in relation to [facility] for provided by Medicare observation status.  Patient unable to sign - signed document left in the room with the patient.  Will follow-up with SNF choice.  Expected Discharge Plan: Skilled Nursing Facility Barriers to Discharge: [place] [facility])   Patient Goals and CMS Choice Patient states their goals for this hospitalization and ongoing recovery are:: I need to get stronger and walk again CMS Medicare.gov Compare Post Acute Care list provided to:: Patient Choice offered to / list presented to : Patient, Adult Children  Expected Discharge Plan and Services Expected Discharge Plan: [place] Choice: [place] Living arrangements for the past 2 months: Single Family Home                                      Prior Living Arrangements/Services Living arrangements for the past 2 months: Single Family Home Lives with:: Adult Children, Self Patient language and need for interpreter reviewed:: No(No interpreter needed)        Need for Family Participation in Patient Care: Yes (Comment)(Pt lives with daughter) Care giver  support system in place?: Yes (comment)(Pt lives with daughter)   Criminal Activity/Legal Involvement Pertinent to Current Situation/Hospitalization: No - Comment as needed  Activities of Daily Living Home Assistive Devices/Equipment: Environmental consultant (specify type) ADL Screening (condition at time of admission) Patient's cognitive ability adequate to safely complete daily activities?: Yes Is the patient deaf or have difficulty hearing?: No Does the patient have difficulty seeing, even when wearing glasses/contacts?: No Does the patient have difficulty concentrating, remembering, or making decisions?: No Patient able to express need for assistance with ADLs?: Yes Does the patient have difficulty dressing or bathing?: No Independently performs ADLs?: No Does the patient have difficulty walking or climbing stairs?: Yes Weakness of Legs: Both Weakness of Arms/Hands: Left  Permission Sought/Granted Permission sought to share information with : Facility Art therapist granted to share information with : Yes, Verbal Permission Granted  Share Information with NAME: Cranford Mon     Permission granted to share info w Relationship: Daughter  Permission granted to share info w Contact Information: [phone]  Emotional Assessment Appearance:: Appears older than stated age Attitude/Demeanor/Rapport: Engaged Affect (typically observed): Pleasant Orientation: : Oriented to Self, Oriented to Place, Oriented to  Time, Oriented to Situation Alcohol / Substance Use: Not Applicable Psych Involvement: No (comment)  Admission diagnosis:  Fall [W19.XXXA] Abrasion [T14.8XXA] Fall, initial encounter [id number]] Fall in home, initial encounter [W19.Merril Abbe, Y92.009] Patient Active Problem List  Diagnosis Date Noted  . Fall 12/23/2019  . Gastrointestinal hemorrhage   . Pressure injury of skin 07/09/2019  . Acute gastric ulcer with hemorrhage   . Symptomatic anemia 06/28/2019  . Elevated  liver function tests   . NICM (nonischemic cardiomyopathy) ([place])   . Mild CAD   . Confusion   . Diabetes ([place])   . Iron deficiency anemia   . Hyperthyroidism   . Altered mental status 05/07/2019  . Acute systolic heart failure ([place]) 05/07/2019  . AKI (acute kidney injury) ([place]) 05/07/2019  . Hyponatremia   . CAD (coronary artery disease)   . Essential hypertension   . Hypokalemia   . Prolonged Q-T interval on ECG 04/30/2019  . Atrial fibrillation with rapid ventricular response ([place]) 04/29/2019  . Persistent atrial fibrillation ([place])   . Hyperlipidemia 04/23/2019  . Anemia 04/23/2019  . CKD (chronic kidney disease) stage 3, GFR 30-59 ml/min 04/23/2019  . Depression 03/02/2019  . Osteoarthritis 03/02/2019  . Type 2 diabetes mellitus with complication, without long-term current use of insulin ([place]) 03/02/2019  . Hypertensive heart disease 03/02/2019   PCP:  Cher Nakai, MD Pharmacy:   [facility] [address] [place] [address] Phone: [phone] Fax: [phone]  [facility] [place], [place] - [address] [place] [address] Phone: [phone] Fax: [place], [place] - [place] [address] [place] [address] Phone: [phone] Fax: [place], [place] - [place] [address] Phone: [phone] Fax: [phone]     Social Determinants of Health ([place]) Interventions    Readmission Risk Interventions Readmission Risk Prevention Plan 07/13/2019  Transportation Screening Complete  PCP or Specialist Appt within 3-5 Days Complete  HRI or [place] Complete  Social Work Consult for [place] Planning/Counseling Complete  Palliative Care Screening Not Applicable  Medication Review Press photographer)  Complete

## 2019-12-24 NOTE — Discharge Instructions (Signed)

## 2019-12-24 NOTE — Consult Note (Signed)
[facility] Nurse Consult Note: Patient receiving care in [place]. Reason for Consult: facial wound secondary to fall Wound type: traumatic injury Pressure Injury POA: Yes/No/NA Measurement: Complete removal of existing gauze and telfa dressing deferred as supplies needed for dressing must be ordered and arrive from materials management.  With very limited, very gentle lifting of existing dressing active bleeding to margin of wound on left face ensued. Wound bed: pink Drainage (amount, consistency, odor) bleeding Periwound: bruised Dressing procedure/placement/frequency: HEAVILY MOISTEN existing left face wound with saline BEFORE attempting to remove.  Place half of a Mepitel dressing Kellie Simmering [phone]) over the wound, directly against the skin--the Mepitel REMAINS IN PLACE UP TO 5 DAYS. Place a piece of Aquacel Kellie Simmering [phone]) over the [place]. Then place dry gauze over the Aquacel. Frame with paper tape to hold in place. Change the Aquacel and dry gauze daily and prn soilage.  Leave the Mepitel in place up to 5 days. Mepitel and Aquacel are being ordered by unit secretary. For all extremity skin tears, I have ordered Xeroform Kellie Simmering #294) and kerlex. Monitor the wound area(s) for worsening of condition such as: Signs/symptoms of infection,  Increase in size,  Development of or worsening of odor, Development of pain, or increased pain at the affected locations.  Notify the medical team if any of these develop.  Thank you for the consult.  Discussed plan of care with the patient and bedside nurse.  [place] nurse will not follow at this time.  Please re-consult the [place] team if needed.  Val Riles, RN, MSN, CWOCN, CNS-BC, pager [phone]

## 2019-12-24 NOTE — Care Management Obs Status (Signed)
[facility] NOTIFICATION   Patient Details  Name: Brianna Porter MRN: [id number] Date of Birth: [birth year]   Medicare Observation Status Notification Given:  Yes    Curlene Labrum, RN 12/24/2019, 11:47 AM

## 2019-12-24 NOTE — Plan of Care (Signed)
  Problem: Education: Goal: Knowledge of General Education information will improve Description: Including pain rating scale, medication(s)/side effects and non-pharmacologic comfort measures Outcome: Progressing   Problem: Health Behavior/Discharge Planning: Goal: Ability to manage health-related needs will improve Outcome: Progressing   Problem: Clinical Measurements: Goal: Ability to maintain clinical measurements within normal limits will improve Outcome: Progressing   Problem: Activity: Goal: Risk for activity intolerance will decrease Outcome: Progressing   Problem: Nutrition: Goal: Adequate nutrition will be maintained Outcome: Progressing   Problem: Coping: Goal: Level of anxiety will decrease Outcome: Progressing   Problem: Pain Managment: Goal: General experience of comfort will improve Outcome: Progressing   Problem: Skin Integrity: Goal: Risk for impaired skin integrity will decrease Outcome: Progressing   

## 2019-12-24 NOTE — Progress Notes (Addendum)
PROGRESS NOTE    Brianna Porter  [id number] DOB: [birth year] DOA: 12/22/2019 PCP: Cher Nakai, MD   Brief Narrative: 59 year old female with history of systolic CHF, A. fib/flutter on Eliquis, hypothyroidism, gastric ulcers, CAD, CKD IIIa, depression, anemia of chronic disease presented to the ED after 2 ground-level falls within 24 hours due to imbalance, generalized weakness.  Patient reported she falls very easily and her legs also "give out on her".  In the ED on initial evaluation was sleepy but denied any fever chills acute pain.  She lives with her daughter. In the ED found to have ecchymosis in multiple areas as of bruising as well as bleeding and mildly displaced blowout fracture of left orbital floor, AVULSION fracture involving the LEFT lateral tibia, CT surgery of head, CT C-spine, chest x-ray, CT maxillofacial-no acute intracranial abnormalities are spine abnormalities.  Case was discussed with the ENT physician and orthopedic by the ED provider and advised outpatient follow-up and no acute intervention. Sodium was low at 131 suspect to be symptomatic and given her generalized weakness recurrent falls admitted with PT OT speech social work evaluation.  Subjective:  Reports she is not feeling well, BP in 90s this am. laying on bed, c/o headache at site whee she fell and has a bump there. Left face covered in dressing Patient awaiting a skilled nursing facility placement. Creatinine has gone up to 2.0 previously 1.3 and 1.4.  Back in February and March was around in 2.s  on lasix and losartan- Holding Losartan as BP stable Eliquis on hold on admission= Hb stable 10.2->8.1>9.0  Assessment & Plan:  Orbital fracture, discussed with the ENT on admission, will need outpatient follow-up.  No acute intervention needed.Follow up outpatient with Dr. Bettye Boeck tubercle fracture: wt beight bearing as tolerated, cont knee mobilizer if significant pain with standing, continue as  needed analgesics and outpatient follow-up with orthopedics.  Facial abrasion-bleeding- pt reluctant to change dressing- wound care on board. RN concerned abt bleeding- hold eliquis tonight and resume from am if stable.  Shoulder pain:No fractures.  This limits examination of arm strength. 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  Last notes suggest she was supposed to be off methimazole, but she reported to [facility] tech she was still taking.  Methimazole stopped on admission.  Free T4 less than 0.25, T3 total less than 20  CAD/NICM/Atrial fibrillation, chronic/Hypertension: Previous echo shows EF 20 to 25% in September/2020.  Pressure is well controlled.  With junctional rhythm.  She will continue with Eliquis since hemoglobin is stable and no significant bleeding.  Continue amiodarone.  Blood pressure low this morning and patient with poor oral intake adding IV fluids gently and will hold off on losartan metoprolol and LASIX TODAY and reasses for resumption in am.  Monitor fluid status.  Denies any shortness of breath on laying, no significant leg edema.  Depression: Mood is stable continue her duloxetine and bupropion.  AKI on CKD IIIa: creat in feb and march was 2.0 range but on admission 1.58 down to 1.3 overnight 2.0.  Adding gentle IV fluids FOR FEW HRS THIS AFTERNOON only and holding off on her antihypertensive and diuretics.    Recent Labs  Lab 12/22/19 1806 12/23/19 0425 12/24/19 0406  BUN 25* 22* 27*  CREATININE 1.58* 1.31* 2.03*   Hyponatremia sodium at 133.  Monitor.  Anemia of chronic disease : stable mow.  Recent Labs  Lab 12/22/19 1806 12/23/19 0425 12/24/19 0406  HGB 10.2* 8.1* 9.0*  HCT 32.8* 25.9* 27.9*   DVT prophylaxis: Eliquis Code Status: FULL Family Communication: plan of care discussed with patient at bedside. Status is:  Observation  The patient will require care spanning > 2 midnights and should be moved to inpatient because: Hemodynamically unstable, Ongoing active pain requiring inpatient pain management, IV treatments appropriate due to intensity of illness or inability to take PO and Inpatient level of care appropriate due to severity of illness   Dispo: The patient is from: Home              Anticipated d/c is to: SNF              Anticipated d/c date is: 1 day              Patient currently is not medically stable to d/c.  Barriers to discharge awaiting on PASRR  Nutrition: Diet Order            Diet Heart Room service appropriate? Yes; Fluid consistency: Thin  Diet effective now             Body mass index is 25.69 kg/m. Consultants:see note  Procedures:see note Microbiology:see note  Medications: Scheduled Meds: . amiodarone  200 mg Oral BID  . vitamin C  1,000 mg Oral Daily  . buPROPion  150 mg Oral Daily  . cholecalciferol  1,000 Units Oral Daily  . DULoxetine  60 mg Oral BID  . fenofibrate  54 mg Oral Daily  . ferrous sulfate  325 mg Oral Daily  . furosemide  40 mg Oral Daily  . loratadine  10 mg Oral Daily  . metoprolol succinate  100 mg Oral Daily  . pantoprazole  40 mg Oral BID  . sodium chloride flush  3 mL Intravenous Q12H   Continuous Infusions: . sodium chloride      Antimicrobials: Anti-infectives (From admission, onward)   None       Objective: Vitals: Today's Vitals   12/24/19 0355 12/24/19 0600 12/24/19 0816 12/24/19 0819  BP: (!) 142/90  (!) 90/37   Pulse: 64  64   Resp: 17  15   Temp: 98 F (36.7 C)  98.5 F (36.9 C)   TempSrc: Oral  Axillary   SpO2: 96%  90%   Height:      PainSc:  Asleep  Asleep    Intake/Output Summary (Last 24 hours) at 12/24/2019 1153 Last data filed at 12/24/2019 0819 Gross per 24 hour  Intake 0 ml  Output --  Net 0 ml   There were no vitals filed for this visit. Weight change:    Intake/Output from previous  day: No intake/output data recorded. Intake/Output this shift: No intake/output data recorded.  Examination:  General exam: AAO x3, weak frail,NAD, weak appearing. HEENT:Oral mucosa moist, Ear/Nose WNL grossly,dentition normal. Respiratory system: bilaterally clear,no wheezing or crackles,no use of accessory muscle, non tender. Cardiovascular system: S1 & S2 +, regular, No JVD. Gastrointestinal system: Abdomen soft, NT,ND, BS+. Nervous System:Alert, awake, moving extremities and grossly nonfocal Extremities: No edema, distal peripheral pulses palpable.  Skin: bruises on skin generalized, left face covered in dressing- did not let me remove to to examine it, bruise visibile involving left eye and left face.  MSK: Normal muscle bulk,tone, power  Data Reviewed: I have personally reviewed following labs and imaging studies CBC: Recent Labs  Lab 12/22/19 1806 12/23/19 0425 12/24/19 0406  WBC 8.0 5.8 6.9  NEUTROABS 6.1  --   --   HGB 10.2* 8.1* 9.0*  HCT 32.8* 25.9* 27.9*  MCV 102.5* 103.2* 99.6  PLT 402* 301 [id number]   Basic Metabolic Panel: Recent Labs  Lab 12/22/19 1806 12/23/19 0425 12/24/19 0406  NA 131* 133* 133*  K 4.7 4.6 3.8  CL 98 102 98  CO2 25 22 24   GLUCOSE 115* 103* 128*  BUN 25* 22* 27*  CREATININE 1.58* 1.31* 2.03*  CALCIUM 9.4 8.1* 9.6   GFR: CrCl cannot be calculated (Unknown ideal weight.). Liver Function Tests: Recent Labs  Lab 12/22/19 1806  AST 43*  ALT 63*  ALKPHOS 50  BILITOT 0.5  PROT 6.6  ALBUMIN 4.1   No results for input(s): LIPASE, AMYLASE in the last 168 hours. No results for input(s): AMMONIA in the last 168 hours. Coagulation Profile: Recent Labs  Lab 12/22/19 1806 12/23/19 0425  INR 1.3* 1.3*   Cardiac Enzymes: No results for input(s): CKTOTAL, CKMB, CKMBINDEX, TROPONINI in the last 168 hours. BNP (last 3 results) Recent Labs    04/23/19 1233 06/12/19 1705 10/16/19 1020  PROBNP 3,353* 12,215* 449*   HbA1C: No results  for input(s): HGBA1C in the last 72 hours. CBG: No results for input(s): GLUCAP in the last 168 hours. Lipid Profile: No results for input(s): CHOL, HDL, LDLCALC, TRIG, CHOLHDL, LDLDIRECT in the last 72 hours. Thyroid Function Tests: Recent Labs    12/23/19 1645  TSH 55.003*  FREET4 <0.25*   Anemia Panel: No results for input(s): VITAMINB12, FOLATE, FERRITIN, TIBC, IRON, RETICCTPCT in the last 72 hours. Sepsis Labs: No results for input(s): PROCALCITON, LATICACIDVEN in the last 168 hours.  Recent Results (from the past 240 hour(s))  Respiratory Panel by RT PCR (Flu A&B, Covid) - Nasopharyngeal Swab     Status: None   Collection Time: 12/22/19  6:22 PM   Specimen: Nasopharyngeal Swab  Result Value Ref Range Status   SARS Coronavirus 2 by RT PCR NEGATIVE NEGATIVE Final    Comment: (NOTE) SARS-CoV-2 target nucleic acids are NOT DETECTED. The SARS-CoV-2 RNA is generally detectable in upper respiratoy specimens during the acute phase of infection. The lowest concentration of SARS-CoV-2 viral copies this assay can detect is 131 copies/mL. A negative result does not preclude SARS-Cov-2 infection and should not be used as the sole basis for treatment or other patient management decisions. A negative result may occur with  improper specimen collection/handling, submission of specimen other than nasopharyngeal swab, presence of viral mutation(s) within the areas targeted by this assay, and inadequate number of viral copies (<131 copies/mL). A negative result must be combined with clinical observations, patient history, and epidemiological information. The expected result is Negative. Fact Sheet for Patients:  PinkCheek.be Fact Sheet for Healthcare Providers:  GravelBags.it This test is not yet ap proved or cleared by the [place] FDA and  has been authorized for detection and/or diagnosis of SARS-CoV-2 by FDA under an  Emergency Use Authorization (EUA). This EUA will remain  in effect (meaning this test can be used) for the duration of the COVID-19 declaration under Section 564(b)(1) of the Act, 21 U.S.C. section 360bbb-3(b)(1), unless the authorization is terminated or revoked sooner.    Influenza A by PCR NEGATIVE NEGATIVE Final   Influenza B by PCR NEGATIVE NEGATIVE Final    Comment: (NOTE) The Xpert Xpress SARS-CoV-2/FLU/RSV assay is intended as an aid in  the diagnosis of influenza from Nasopharyngeal swab specimens and  should not be  used as a sole basis for treatment. Nasal washings and  aspirates are unacceptable for Xpert Xpress SARS-CoV-2/FLU/RSV  testing. Fact Sheet for Patients: PinkCheek.be Fact Sheet for Healthcare Providers: GravelBags.it This test is not yet approved or cleared by the [place] FDA and  has been authorized for detection and/or diagnosis of SARS-CoV-2 by  FDA under an Emergency Use Authorization (EUA). This EUA will remain  in effect (meaning this test can be used) for the duration of the  Covid-19 declaration under Section 564(b)(1) of the Act, 21  U.S.C. section 360bbb-3(b)(1), unless the authorization is  terminated or revoked. Performed at [facility], [place] [address], [place], [place] [address]       Radiology Studies: DG Pelvis 1-2 Views  Result Date: 12/22/2019 CLINICAL DATA:  Fall with bilateral hip pain EXAM: PELVIS - 1-2 VIEW COMPARISON:  07/10/2019 FINDINGS: There is no evidence of pelvic fracture or diastasis. No pelvic bone lesions are seen. IMPRESSION: Negative. Electronically Signed   By: Donavan Foil M.D.   On: 12/22/2019 18:40   DG Knee 2 Views Left  Result Date: 12/22/2019 CLINICAL DATA:  Recent fall. Knee pain. EXAM: LEFT KNEE - 1-2 VIEW COMPARISON:  None. FINDINGS: The joint spaces are fairly well maintained. Slight lateral joint space narrowing and early spurring. Possible  tiny avulsion fracture involving the lateral tibia. Difficult to be certain because there is significant clothing artifact. Suspect suprapatellar knee joint effusion. IMPRESSION: 1. Possible tiny avulsion fracture involving the lateral tibia. 2. Suspect suprapatellar knee joint effusion. 3. Mild lateral joint space narrowing and early spurring. Electronically Signed   By: Marijo Sanes M.D.   On: 12/22/2019 18:43   CT Head Wo Contrast  Result Date: 12/22/2019 CLINICAL DATA:  Multiple falls, scalp laceration, Eliquis EXAM: CT HEAD WITHOUT CONTRAST CT MAXILLOFACIAL WITHOUT CONTRAST CT CERVICAL SPINE WITHOUT CONTRAST TECHNIQUE: Multidetector CT imaging of the head, cervical spine, and maxillofacial structures were performed using the standard protocol without intravenous contrast. Multiplanar CT image reconstructions of the cervical spine and maxillofacial structures were also generated. COMPARISON:  07/09/2019 FINDINGS: CT HEAD FINDINGS Brain: No evidence of acute infarction, hemorrhage, hydrocephalus, extra-axial collection or mass lesion/mass effect. Mild periventricular white matter hypodensity. Vascular: No hyperdense vessel or unexpected calcification. CT FACIAL BONES FINDINGS Skull: Normal. Negative for fracture or focal lesion. Facial bones: No displaced fractures or dislocations of the facial bones proper. Sinuses/Orbits: There is a hyperdense fluid level within the left maxillary sinus. Small, mildly displaced blowout fracture fragment of the left orbital floor, fracture fragment measuring approximately 5 mm x 9 mm with 4 mm displacement. There is a small amount of fat herniation without involvement of the inferior rectus. Other: Soft tissue contusion and laceration of the left cheek and eyelids. Right parietal scalp laceration. CT CERVICAL SPINE FINDINGS Alignment: Normal. Skull base and vertebrae: No acute fracture. No primary bone lesion or focal pathologic process. Soft tissues and spinal canal: No  prevertebral fluid or swelling. No visible canal hematoma. Disc levels:  Intact. Upper chest: Negative. Other: None. IMPRESSION: 1.  No acute intracranial pathology. 2. Small, mildly displaced blowout fracture fragment of the left orbital floor, fracture fragment measuring approximately 5 mm x 9 mm with 4 mm displacement. There is a small amount of fat herniation without involvement of the inferior rectus. 3. Soft tissue contusion and laceration of the left cheek and eyelids. 4.  Right parietal scalp laceration. 5.  No fracture or static subluxation of the cervical spine. Electronically Signed  By: Eddie Candle M.D.   On: 12/22/2019 19:43   CT Cervical Spine Wo Contrast  Result Date: 12/22/2019 CLINICAL DATA:  Multiple falls, scalp laceration, Eliquis EXAM: CT HEAD WITHOUT CONTRAST CT MAXILLOFACIAL WITHOUT CONTRAST CT CERVICAL SPINE WITHOUT CONTRAST TECHNIQUE: Multidetector CT imaging of the head, cervical spine, and maxillofacial structures were performed using the standard protocol without intravenous contrast. Multiplanar CT image reconstructions of the cervical spine and maxillofacial structures were also generated. COMPARISON:  07/09/2019 FINDINGS: CT HEAD FINDINGS Brain: No evidence of acute infarction, hemorrhage, hydrocephalus, extra-axial collection or mass lesion/mass effect. Mild periventricular white matter hypodensity. Vascular: No hyperdense vessel or unexpected calcification. CT FACIAL BONES FINDINGS Skull: Normal. Negative for fracture or focal lesion. Facial bones: No displaced fractures or dislocations of the facial bones proper. Sinuses/Orbits: There is a hyperdense fluid level within the left maxillary sinus. Small, mildly displaced blowout fracture fragment of the left orbital floor, fracture fragment measuring approximately 5 mm x 9 mm with 4 mm displacement. There is a small amount of fat herniation without involvement of the inferior rectus. Other: Soft tissue contusion and laceration of  the left cheek and eyelids. Right parietal scalp laceration. CT CERVICAL SPINE FINDINGS Alignment: Normal. Skull base and vertebrae: No acute fracture. No primary bone lesion or focal pathologic process. Soft tissues and spinal canal: No prevertebral fluid or swelling. No visible canal hematoma. Disc levels:  Intact. Upper chest: Negative. Other: None. IMPRESSION: 1.  No acute intracranial pathology. 2. Small, mildly displaced blowout fracture fragment of the left orbital floor, fracture fragment measuring approximately 5 mm x 9 mm with 4 mm displacement. There is a small amount of fat herniation without involvement of the inferior rectus. 3. Soft tissue contusion and laceration of the left cheek and eyelids. 4.  Right parietal scalp laceration. 5.  No fracture or static subluxation of the cervical spine. Electronically Signed   By: Eddie Candle M.D.   On: 12/22/2019 19:43   DG Chest Port 1 View  Result Date: 12/22/2019 CLINICAL DATA:  Fall EXAM: PORTABLE CHEST 1 VIEW COMPARISON:  07/09/2019 FINDINGS: The heart size and mediastinal contours are within normal limits. Aortic atherosclerosis. Both lungs are clear. The visualized skeletal structures are unremarkable. IMPRESSION: No active disease. Electronically Signed   By: Donavan Foil M.D.   On: 12/22/2019 18:40   CT Maxillofacial WO CM  Result Date: 12/22/2019 CLINICAL DATA:  Multiple falls, scalp laceration, Eliquis EXAM: CT HEAD WITHOUT CONTRAST CT MAXILLOFACIAL WITHOUT CONTRAST CT CERVICAL SPINE WITHOUT CONTRAST TECHNIQUE: Multidetector CT imaging of the head, cervical spine, and maxillofacial structures were performed using the standard protocol without intravenous contrast. Multiplanar CT image reconstructions of the cervical spine and maxillofacial structures were also generated. COMPARISON:  07/09/2019 FINDINGS: CT HEAD FINDINGS Brain: No evidence of acute infarction, hemorrhage, hydrocephalus, extra-axial collection or mass lesion/mass effect. Mild  periventricular white matter hypodensity. Vascular: No hyperdense vessel or unexpected calcification. CT FACIAL BONES FINDINGS Skull: Normal. Negative for fracture or focal lesion. Facial bones: No displaced fractures or dislocations of the facial bones proper. Sinuses/Orbits: There is a hyperdense fluid level within the left maxillary sinus. Small, mildly displaced blowout fracture fragment of the left orbital floor, fracture fragment measuring approximately 5 mm x 9 mm with 4 mm displacement. There is a small amount of fat herniation without involvement of the inferior rectus. Other: Soft tissue contusion and laceration of the left cheek and eyelids. Right parietal scalp laceration. CT CERVICAL SPINE FINDINGS Alignment: Normal. Skull base and vertebrae:  No acute fracture. No primary bone lesion or focal pathologic process. Soft tissues and spinal canal: No prevertebral fluid or swelling. No visible canal hematoma. Disc levels:  Intact. Upper chest: Negative. Other: None. IMPRESSION: 1.  No acute intracranial pathology. 2. Small, mildly displaced blowout fracture fragment of the left orbital floor, fracture fragment measuring approximately 5 mm x 9 mm with 4 mm displacement. There is a small amount of fat herniation without involvement of the inferior rectus. 3. Soft tissue contusion and laceration of the left cheek and eyelids. 4.  Right parietal scalp laceration. 5.  No fracture or static subluxation of the cervical spine. Electronically Signed   By: Eddie Candle M.D.   On: 12/22/2019 19:43     LOS: 0 days   Time spent: More than 50% of that time was spent in counseling and/or coordination of care.  Antonieta Pert, MD [facility]  12/24/2019, 11:53 AM

## 2019-12-24 NOTE — Evaluation (Signed)
Clinical/Bedside Swallow Evaluation Patient Details  Name: Brianna Porter MRN: [id number] Date of Birth: Jan 30, 1961  Today's Date: 12/24/2019 Time: SLP Start Time (ACUTE ONLY): 1015 SLP Stop Time (ACUTE ONLY): 1025 SLP Time Calculation (min) (ACUTE ONLY): 10 min  Past Medical History:  Past Medical History:  Diagnosis Date  . Anxiety and depression   . Atrial flutter ([place])   . Chronic systolic CHF (congestive heart failure) (HCC)    a. dx in setting of atrial fib/flutter - possibly tachy mediated. Coronary CTA with only mild CAD in 04/2019.  Marland Kitchen CKD (chronic kidney disease), stage II   . Confusion    a. persistent confusion during 04/2019 admission of unclear cause. Home meds adjusted. CT/MRI brain nonacute.  . Diabetes ([place])   . Edema   . Elevated liver function tests   . Hypercholesteremia   . Hyperkalemia   . Hypertension   . Hypertensive heart and chronic kidney disease with systolic congestive heart failure ([place])   . Hyperthyroidism   . Hypokalemia   . Hypokalemia   . Hypomagnesemia   . Hyponatremia   . Iron deficiency anemia   . Mild CAD    a. Coronary CT 04/2019 - Minimal, Non-obstructive CAD.  Marland Kitchen NICM (nonischemic cardiomyopathy) ([place])   . Persistent atrial fibrillation ([place])   . Prolonged QT interval    Past Surgical History:  Past Surgical History:  Procedure Laterality Date  . BIOPSY  06/29/2019   Procedure: BIOPSY;  Surgeon: Irene Shipper, MD;  Location: [place];  Service: Endoscopy;;  . CARDIOVERSION N/A 04/29/2019   Procedure: CARDIOVERSION;  Surgeon: Sanda Klein, MD;  Location: [place];  Service: Cardiovascular;  Laterality: N/A;  . CARDIOVERSION N/A 05/04/2019   Procedure: CARDIOVERSION;  Surgeon: Josue Hector, MD;  Location: [place];  Service: Cardiovascular;  Laterality: N/A;  . COLONOSCOPY WITH PROPOFOL N/A 07/12/2019   Procedure: COLONOSCOPY WITH PROPOFOL;  Surgeon: Doran Stabler, MD;  Location: [place];  Service:  Gastroenterology;  Laterality: N/A;  . ENTEROSCOPY N/A 07/10/2019   Procedure: ENTEROSCOPY;  Surgeon: Doran Stabler, MD;  Location: [employer];  Service: Gastroenterology;  Laterality: N/A;  . ESOPHAGOGASTRODUODENOSCOPY  06/29/2019  . ESOPHAGOGASTRODUODENOSCOPY (EGD) WITH PROPOFOL N/A 06/29/2019   Procedure: ESOPHAGOGASTRODUODENOSCOPY (EGD) WITH PROPOFOL;  Surgeon: Irene Shipper, MD;  Location: [facility] ENDOSCOPY;  Service: Endoscopy;  Laterality: N/A;  . GIVENS CAPSULE STUDY N/A 07/12/2019   Procedure: GIVENS CAPSULE STUDY;  Surgeon: Doran Stabler, MD;  Location: [place];  Service: Gastroenterology;  Laterality: N/A;  . HEMOSTASIS CLIP PLACEMENT  07/12/2019   Procedure: HEMOSTASIS CLIP PLACEMENT;  Surgeon: Doran Stabler, MD;  Location: [place];  Service: Gastroenterology;;  . POLYPECTOMY  07/12/2019   Procedure: POLYPECTOMY;  Surgeon: Doran Stabler, MD;  Location: [place];  Service: Gastroenterology;;  . SMALL BOWEL ENTEROSCOPY  07/10/2019  . SUBMUCOSAL TATTOO INJECTION  07/10/2019   Procedure: SUBMUCOSAL TATTOO INJECTION;  Surgeon: Doran Stabler, MD;  Location[facility] END[facility]COPY;  Service: Gastroenterology;;  . TEE WITHOUT CARDIOVERSION  04/29/2019  . TEE WITHOUT CARDIOVERSION N/A 04/29/2019   Procedure: TRANSESOPHAGEAL ECHOCARDIOGRAM (TEE);  Surgeon: Sanda Klein, MD;  Location: [facility] ENDOSCOPY;  Service: Cardiovascular;  Laterality: N/A;  . TUBAL LIGATION     HPI:  Pt is a 59 y.o. female with medical history significant of systolic heart failure, CKD, A. fib/flutter on apixaban, hyperthyroidism, gastric ulcers, and CAD who presented after 2 ground-level falls within 24 hours. CT maxillofacial: Soft  tissue contusion and laceration of the left cheek and eyelids. Small, mildly displaced blowout fracture fragment of the left orbital floor, fracture fragment measuring approximately 5 mm x 9 mm with 4 mm displacement. CT head: no acute changes   Assessment / Plan /  Recommendation Clinical Impression  Pt was seen for bedside swallow evaluation and she denied a history of dysphagia. Oral mechanism exam was [facility] but pt reported pain while trying to use left-sided facial muscles. No signs or symptoms of oropharyngeal dysphagia were noted with any solids or liquids. It is recommended that a regular texture diet be continued. Further skilled SLP services are not clinically indicated at this time for swallowing.  SLP Visit Diagnosis: Dysphagia, unspecified (R13.10)    Aspiration Risk  No limitations    Diet Recommendation Regular;Thin liquid   Liquid Administration via: Cup;Straw Medication Administration: Whole meds with liquid Supervision: Patient able to self feed Postural Changes: Seated upright at 90 degrees    Other  Recommendations Oral Care Recommendations: Oral care BID;Patient independent with oral care   Follow up Recommendations None      Frequency and Duration            Prognosis        Swallow Study   General Date of Onset: 12/23/19 HPI: Pt is a 59 y.o. female with medical history significant of systolic heart failure, CKD, A. fib/flutter on apixaban, hyperthyroidism, gastric ulcers, and CAD who presented after 2 ground-level falls within 24 hours. CT maxillofacial: Soft tissue contusion and laceration of the left cheek and eyelids. Small, mildly displaced blowout fracture fragment of the left orbital floor, fracture fragment measuring approximately 5 mm x 9 mm with 4 mm displacement. CT head: no acute changes Type of Study: Bedside Swallow Evaluation Previous Swallow Assessment: None Diet Prior to this Study: Regular;Thin liquids Temperature Spikes Noted: No Respiratory Status: Room air History of Recent Intubation: No Behavior/Cognition: Alert;Cooperative;Pleasant mood Oral Cavity Assessment: Within Functional Limits Oral Care Completed by SLP: No Oral Cavity - Dentition: Adequate natural dentition Vision: Functional for  self-feeding Self-Feeding Abilities: Able to feed self Patient Positioning: Upright in bed;Postural control adequate for testing Baseline Vocal Quality: Normal Volitional Cough: Strong Volitional Swallow: Able to elicit    Oral/Motor/Sensory Function Overall Oral Motor/Sensory Function: Within functional limits   Ice Chips Ice chips: Within functional limits Presentation: Spoon   Thin Liquid Thin Liquid: Within functional limits Presentation: Straw    Nectar Thick Nectar Thick Liquid: Not tested   Honey Thick Honey Thick Liquid: Not tested   Puree Puree: Not tested(Pt refused)   Solid     Solid: Within functional limits Presentation: Self Fed     Gurpreet Mikhail I. Hardin Negus, [place], [place] Office number [phone] Pager [place] 12/24/2019,10:29 AM

## 2019-12-25 LAB — CBC
HCT: 25.4 % — ABNORMAL LOW (ref 36.0–46.0)
Hemoglobin: 8.1 g/dL — ABNORMAL LOW (ref 12.0–15.0)
MCH: 32.5 pg (ref 26.0–34.0)
MCHC: 31.9 g/dL (ref 30.0–36.0)
MCV: 102 fL — ABNORMAL HIGH (ref 80.0–100.0)
Platelets: 361 10*3/uL (ref 150–400)
RBC: 2.49 MIL/uL — ABNORMAL LOW (ref 3.87–5.11)
RDW: 14.5 % (ref 11.5–15.5)
WBC: 5.8 10*3/uL (ref 4.0–10.5)
nRBC: 0.5 % — ABNORMAL HIGH (ref 0.0–0.2)

## 2019-12-25 LAB — BASIC METABOLIC PANEL
Anion gap: 8 (ref 5–15)
BUN: 27 mg/dL — ABNORMAL HIGH (ref 6–20)
CO2: 25 mmol/L (ref 22–32)
Calcium: 9.1 mg/dL (ref 8.9–10.3)
Chloride: 101 mmol/L (ref 98–111)
Creatinine, Ser: 2 mg/dL — ABNORMAL HIGH (ref 0.44–1.00)
GFR calc Af Amer: 31 mL/min — ABNORMAL LOW (ref >60)
GFR calc non Af Amer: 27 mL/min — ABNORMAL LOW (ref >60)
Glucose, Bld: 141 mg/dL — ABNORMAL HIGH (ref 70–99)
Potassium: 4.3 mmol/L (ref 3.5–5.1)
Sodium: 134 mmol/L — ABNORMAL LOW (ref 135–145)

## 2019-12-25 MED ORDER — SODIUM CHLORIDE 0.9 % IV SOLN: INTRAVENOUS | Status: AC

## 2019-12-25 MED ORDER — DULOXETINE HCL 60 MG PO CPEP
120.0000 mg | ORAL_CAPSULE | Freq: Every day | ORAL | Status: DC
  Administered 2019-12-26 – 2019-12-30 (×5): 120 mg via ORAL
  Filled 2019-12-25 (×5): qty 2

## 2019-12-25 MED ORDER — DULOXETINE HCL 60 MG PO CPEP
60.0000 mg | ORAL_CAPSULE | Freq: Once | ORAL | Status: AC
  Administered 2019-12-25: 60 mg via ORAL

## 2019-12-25 NOTE — Progress Notes (Signed)
Physical Therapy Treatment Patient Details Name: Brianna Porter MRN: [id number] DOB: 02/08/1961 Today's Date: 12/25/2019    History of Present Illness 59 y.o. female with medical history significant of systolic heart failure, CKD, A. fib/flutter on apixaban, hyperthyroidism, gastric ulcers, and CAD who presents after 2 ground-level falls within 24 hours. Found to have L tibial avulsion fx and L orbital floor fx.     PT Comments    Pt progressing steadily towards her physical therapy goals, with improved pain control and activity tolerance. Ambulating multiple bouts of 65 feet with a walker and a close chair follow. Demonstrates gait abnormalities, weakness, decreased gait speed, and balance impairments. Presents as a high fall risk based on these deficits and history of recurrent falling. Continue to recommend SNF for ongoing Physical Therapy.      Follow Up Recommendations  SNF;Supervision/Assistance - 24 hour     Equipment Recommendations  Wheelchair (measurements PT)    Recommendations for Other Services       Precautions / Restrictions Precautions Precautions: Fall Restrictions Weight Bearing Restrictions: No    Mobility  Bed Mobility               General bed mobility comments: OOB on BSC  Transfers Overall transfer level: Needs assistance Equipment used: Rolling walker (2 wheeled) Transfers: Sit to/from Stand Sit to Stand: Min guard            Ambulation/Gait Ambulation/Gait assistance: Min guard Gait Distance (Feet): 65 Feet(65", 65", 65") Assistive device: Rolling walker (2 wheeled) Gait Pattern/deviations: Step-through pattern;Antalgic;Decreased stride length;Decreased stance time - left Gait velocity: Decreased   General Gait Details: Pt with antalgic gait pattern and knee instability, requiring min guard assist for stability and close chair follow. Required 1 standing rest break and 1 sitting rest break due to dyspnea on exertion, SpO2 100% on RA.  Cues for walker proximity.   Stairs             Wheelchair Mobility    Modified Rankin (Stroke Patients Only)       Balance Overall balance assessment: Needs assistance Sitting-balance support: No upper extremity supported Sitting balance-Leahy Scale: Fair     Standing balance support: Bilateral upper extremity supported;During functional activity Standing balance-Leahy Scale: Poor Standing balance comment: Reliant on BUE support and external support                             Cognition Arousal/Alertness: Awake/alert Behavior During Therapy: WFL for tasks assessed/performed Overall Cognitive Status: No family/caregiver present to determine baseline cognitive functioning                                 General Comments: Decreased safety awareness with poor insight to deficits.       Exercises General Exercises - Lower Extremity Long Arc Quad: Both;10 reps;Seated Hip Flexion/Marching: Both;10 reps;Seated    General Comments        Pertinent Vitals/Pain Pain Assessment: Faces Faces Pain Scale: Hurts a little bit Pain Location: face; LLE Pain Descriptors / Indicators: Discomfort;Grimacing;Guarding Pain Intervention(s): Monitored during session    Home Living                      Prior Function            PT Goals (current goals can now be found in the care plan section) Acute Rehab PT  Goals Patient Stated Goal: to figure out why she is falling PT Goal Formulation: With patient Time For Goal Achievement: 01/06/20 Potential to Achieve Goals: Good Progress towards PT goals: Progressing toward goals    Frequency    Min 3X/week      PT Plan Current plan remains appropriate    Co-evaluation              AM-PAC PT "6 Clicks" Mobility   Outcome Measure  Help needed turning from your back to your side while in a flat bed without using bedrails?: A Little Help needed moving from lying on your back to  sitting on the side of a flat bed without using bedrails?: A Little Help needed moving to and from a bed to a chair (including a wheelchair)?: A Little Help needed standing up from a chair using your arms (e.g., wheelchair or bedside chair)?: A Little Help needed to walk in hospital room?: A Lot Help needed climbing 3-5 steps with a railing? : Total 6 Click Score: 15    End of Session Equipment Utilized During Treatment: Gait belt Activity Tolerance: Patient tolerated treatment well Patient left: with call bell/phone within reach;in chair;with chair alarm set Nurse Communication: Mobility status PT Visit Diagnosis: History of falling (Z91.81);Repeated falls (R29.6);Muscle weakness (generalized) (M62.81);Unsteadiness on feet (R26.81)     Time: [id number] PT Time Calculation (min) (ACUTE ONLY): 29 min  Charges:  $Gait Training: 8-22 mins $Therapeutic Activity: 8-22 mins                       Brianna Porter, PT, DPT Acute Rehabilitation Services Pager [phone] Office [phone]    Deno Etienne 12/25/2019, 3:32 PM

## 2019-12-25 NOTE — Plan of Care (Signed)
  Problem: Education: Goal: Knowledge of General Education information will improve Description: Including pain rating scale, medication(s)/side effects and non-pharmacologic comfort measures Outcome: Progressing   Problem: Activity: Goal: Risk for activity intolerance will decrease Outcome: Progressing   Problem: Nutrition: Goal: Adequate nutrition will be maintained Outcome: Progressing   Problem: Coping: Goal: Level of anxiety will decrease Outcome: Progressing   Problem: Elimination: Goal: Will not experience complications related to bowel motility Outcome: Progressing Goal: Will not experience complications related to urinary retention Outcome: Progressing   Problem: Pain Managment: Goal: General experience of comfort will improve Outcome: Progressing   Problem: Safety: Goal: Ability to remain free from injury will improve Outcome: Progressing Note: Bed alarm on at all times. Pt compliant with pushing call light for assistance

## 2019-12-25 NOTE — Progress Notes (Signed)
PROGRESS NOTE    Brianna Porter  [id number] DOB: 1961/03/19 DOA: 12/22/2019 PCP: Cher Nakai, MD   Brief Narrative: 59 year old female with history of systolic CHF, A. fib/flutter on Eliquis, hypothyroidism, gastric ulcers, CAD, CKD IIIa, depression, anemia of chronic disease presented to the ED after 2 ground-level falls within 24 hours due to imbalance, generalized weakness.  Patient reported she falls very easily and her legs also "give out on her".  In the ED on initial evaluation was sleepy but denied any fever chills acute pain.  She lives with her daughter. In the ED found to have ecchymosis in multiple areas as of bruising as well as bleeding and mildly displaced blowout fracture of left orbital floor, AVULSION fracture involving the LEFT lateral tibia, CT surgery of head, CT C-spine, chest x-ray, CT maxillofacial-no acute intracranial abnormalities are spine abnormalities.  Case was discussed with the ENT physician and orthopedic by the ED provider and advised outpatient follow-up and no acute intervention. Sodium was low at 131 suspect to be symptomatic and given her generalized weakness recurrent falls admitted with PT OT speech social work evaluation.  Subjective: This morning denies any pain.  Reports she had good dinner. Left face covered in dressing Patient awaiting a skilled nursing facility placement. Creatinine has gone up to 2.0 previously 1.3 and 1.4.  Back in February and March was around in 2.s bp stable  Assessment & Plan:  Orbital fracture, EDP discussed with the ENT on admission, will need outpatient follow-up.  No acute intervention needed.Follow up outpatient with Dr. Bettye Boeck tubercle fracture: wt beight bearing as tolerated, cont knee mobilizer if significant pain with standing, continue as needed analgesics and outpatient follow-up with orthopedics.  Facial abrasion-bleeding- pt reluctant to change dressing- wound care on board. RN concerned abt  bleeding- held eliquis-we will resume and monitor.   Shoulder pain:No fractures.  This limits examination of arm strength. 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  Last notes suggest she was supposed to be off methimazole, but she reported to [facility] tech she was still taking.  Methimazole stopped on admission.  Free T4 less than 0.25, T3 total less than 20  CAD/NICM/Atrial fibrillation, chronic/Hypertension: Previous echo shows EF 20 to 25% in September/2020.  Pressure is well controlled.  With junctional rhythm.  She will continue with Eliquis since hemoglobin is stable and no significant bleeding.  Continue amiodarone.  Blood pressure improving.  Continue metoprolol, Lasix holding losartan.  No significant edema, no shortness of breath and lungs are clear, tolerating gentle IV fluids.  Depression: Mood is stable continue her duloxetine and bupropion.  AKI on CKD IIIa: creat in feb and march was 2.0 range but on admission 1.58 down to 1.3 overnight 2.0.  Added gentle IV fluids, will run for 12 hrs and stop . Cont home lasix, hold losartan Recent Labs  Lab 12/22/19 1806 12/23/19 0425 12/24/19 0406 12/25/19 0356  BUN 25* 22* 27* 27*  CREATININE 1.58* 1.31* 2.03* 2.00*   Hyponatremia sodium stable.Monitor.  Anemia of chronic disease : Slightly drifting.  We will continue to monitor.  Will hold Eliquis if has ongoing low hemoglobin. Recent Labs  Lab 12/22/19 1806 12/23/19 0425 12/24/19 0406 12/25/19 0356  HGB 10.2* 8.1* 9.0* 8.1*  HCT 32.8* 25.9* 27.9* 25.4*   DVT prophylaxis: Eliquis Code Status: FULL Family Communication: plan of care discussed with patient at bedside. Status  is: Observation  The patient will require care spanning > 2 midnights and should be moved to inpatient because: Hemodynamically unstable, Ongoing active pain requiring inpatient  pain management, IV treatments appropriate due to intensity of illness or inability to take PO and Inpatient level of care appropriate due to severity of illness   Dispo: The patient is from: Home              Anticipated d/c is to: SNF              Anticipated d/c date is: 1 day              Patient currently is not medically stable to d/c.  Barriers to discharge awaiting on PASRR  Nutrition: Diet Order            Diet Heart Room service appropriate? No; Fluid consistency: Thin  Diet effective now             Body mass index is 27.62 kg/m. Consultants:see note  Procedures:see note Microbiology:see note  Medications: Scheduled Meds: . amiodarone  200 mg Oral BID  . apixaban  5 mg Oral BID  . vitamin C  1,000 mg Oral Daily  . buPROPion  150 mg Oral Daily  . cholecalciferol  1,000 Units Oral Daily  . DULoxetine  60 mg Oral BID  . fenofibrate  54 mg Oral Daily  . ferrous sulfate  325 mg Oral Daily  . furosemide  40 mg Oral Daily  . loratadine  10 mg Oral Daily  . metoprolol succinate  100 mg Oral Daily  . pantoprazole  40 mg Oral BID  . sodium chloride flush  3 mL Intravenous Q12H   Continuous Infusions: . sodium chloride 75 mL/hr at 12/25/19 0905    Antimicrobials: Anti-infectives (From admission, onward)   None       Objective: Vitals: Today's Vitals   12/25/19 0349 12/25/19 0734 12/25/19 0906 12/25/19 0945  BP: (!) 102/52   (!) 129/52  Pulse: 71   68  Resp: 17 18  17   Temp: 98.1 F (36.7 C)   98.2 F (36.8 C)  TempSrc: Oral   Oral  SpO2: 95%   97%  Weight:   68.5 kg   Height:   5\' 2"  (1.575 m)   PainSc:  Asleep      Intake/Output Summary (Last 24 hours) at 12/25/2019 1301 Last data filed at 12/25/2019 1050 Gross per 24 hour  Intake 351.81 ml  Output 1 ml  Net 350.81 ml   Filed Weights   12/25/19 0906  Weight: 68.5 kg   Weight change:    Intake/Output from previous day: 05/06 0701 - 05/07 0700 In: 116.6 [I.V.:116.6] Out: 1  [Urine:1] Intake/Output this shift: Total I/O In: 235.2 [P.O.:120; I.V.:115.2] Out: -   Examination:  General exam: AAO x3, weak frail, not in acute distress, on room air.   HEENT:Oral mucosa moist, Ear/Nose WNL grossly,dentition normal. Respiratory system: bilaterally clear,no wheezing or crackles,no use of accessory muscle, non tender. Cardiovascular system: S1 & S2 +, regular, No JVD. Gastrointestinal system: Abdomen soft, NT,ND, BS+. Nervous System:Alert, awake, moving extremities and grossly nonfocal Extremities: No edema, distal peripheral pulses palpable.  Skin: bruises on skin generalized, left face covered in dressing no obvious bleeding, MSK: Normal muscle bulk,tone, power  Data Reviewed: I have personally reviewed following labs and imaging studies CBC: Recent Labs  Lab 12/22/19 1806 12/23/19 0425 12/24/19 0406 12/25/19 0356  WBC 8.0 5.8 6.9 5.8  NEUTROABS  6.1  --   --   --   HGB 10.2* 8.1* 9.0* 8.1*  HCT 32.8* 25.9* 27.9* 25.4*  MCV 102.5* 103.2* 99.6 102.0*  PLT 402* 301 376 [id number]   Basic Metabolic Panel: Recent Labs  Lab 12/22/19 1806 12/23/19 0425 12/24/19 0406 12/25/19 0356  NA 131* 133* 133* 134*  K 4.7 4.6 3.8 4.3  CL 98 102 98 101  CO2 25 22 24 25   GLUCOSE 115* 103* 128* 141*  BUN 25* 22* 27* 27*  CREATININE 1.58* 1.31* 2.03* 2.00*  CALCIUM 9.4 8.1* 9.6 9.1   GFR: Estimated Creatinine Clearance: 27.8 mL/min (A) (by C-G formula based on SCr of 2 mg/dL (H)). Liver Function Tests: Recent Labs  Lab 12/22/19 1806  AST 43*  ALT 63*  ALKPHOS 50  BILITOT 0.5  PROT 6.6  ALBUMIN 4.1   No results for input(s): LIPASE, AMYLASE in the last 168 hours. No results for input(s): AMMONIA in the last 168 hours. Coagulation Profile: Recent Labs  Lab 12/22/19 1806 12/23/19 0425  INR 1.3* 1.3*   Cardiac Enzymes: No results for input(s): CKTOTAL, CKMB, CKMBINDEX, TROPONINI in the last 168 hours. BNP (last 3 results) Recent Labs    04/23/19 1233  06/12/19 1705 10/16/19 1020  PROBNP 3,353* 12,215* 449*   HbA1C: No results for input(s): HGBA1C in the last 72 hours. CBG: No results for input(s): GLUCAP in the last 168 hours. Lipid Profile: No results for input(s): CHOL, HDL, LDLCALC, TRIG, CHOLHDL, LDLDIRECT in the last 72 hours. Thyroid Function Tests: Recent Labs    12/23/19 1645  TSH 55.003*  FREET4 <0.25*   Anemia Panel: No results for input(s): VITAMINB12, FOLATE, FERRITIN, TIBC, IRON, RETICCTPCT in the last 72 hours. Sepsis Labs: No results for input(s): PROCALCITON, LATICACIDVEN in the last 168 hours.  Recent Results (from the past 240 hour(s))  Respiratory Panel by RT PCR (Flu A&B, Covid) - Nasopharyngeal Swab     Status: None   Collection Time: 12/22/19  6:22 PM   Specimen: Nasopharyngeal Swab  Result Value Ref Range Status   SARS Coronavirus 2 by RT PCR NEGATIVE NEGATIVE Final    Comment: (NOTE) SARS-CoV-2 target nucleic acids are NOT DETECTED. The SARS-CoV-2 RNA is generally detectable in upper respiratoy specimens during the acute phase of infection. The lowest concentration of SARS-CoV-2 viral copies this assay can detect is 131 copies/mL. A negative result does not preclude SARS-Cov-2 infection and should not be used as the sole basis for treatment or other patient management decisions. A negative result may occur with  improper specimen collection/handling, submission of specimen other than nasopharyngeal swab, presence of viral mutation(s) within the areas targeted by this assay, and inadequate number of viral copies (<131 copies/mL). A negative result must be combined with clinical observations, patient history, and epidemiological information. The expected result is Negative. Fact Sheet for Patients:  PinkCheek.be Fact Sheet for Healthcare Providers:  GravelBags.it This test is not yet ap proved or cleared by the [place] FDA and  has  been authorized for detection and/or diagnosis of SARS-CoV-2 by FDA under an Emergency Use Authorization (EUA). This EUA will remain  in effect (meaning this test can be used) for the duration of the COVID-19 declaration under Section 564(b)(1) of the Act, 21 U.S.C. section 360bbb-3(b)(1), unless the authorization is terminated or revoked sooner.    Influenza A by PCR NEGATIVE NEGATIVE Final   Influenza B by PCR NEGATIVE NEGATIVE Final    Comment: (NOTE) The Xpert Xpress SARS-CoV-2/FLU/RSV assay  is intended as an aid in  the diagnosis of influenza from Nasopharyngeal swab specimens and  should not be used as a sole basis for treatment. Nasal washings and  aspirates are unacceptable for Xpert Xpress SARS-CoV-2/FLU/RSV  testing. Fact Sheet for Patients: PinkCheek.be Fact Sheet for Healthcare Providers: GravelBags.it This test is not yet approved or cleared by the [place] FDA and  has been authorized for detection and/or diagnosis of SARS-CoV-2 by  FDA under an Emergency Use Authorization (EUA). This EUA will remain  in effect (meaning this test can be used) for the duration of the  Covid-19 declaration under Section 564(b)(1) of the Act, 21  U.S.C. section 360bbb-3(b)(1), unless the authorization is  terminated or revoked. Performed at [facility], [place] [address] [place], [place] [address]       Radiology Studies: No results found.   LOS: 1 day   Time spent: More than 50% of that time was spent in counseling and/or coordination of care.  Antonieta Pert, MD [facility]  12/25/2019, 1:01 PM

## 2019-12-25 NOTE — Plan of Care (Signed)

## 2019-12-25 NOTE — Plan of Care (Signed)

## 2019-12-25 NOTE — TOC Transition Note (Addendum)
Transition of Care [facility]) - CM/SW Discharge Note   Patient Details  Name: Brianna Porter MRN: [id number] Date of Birth: 1961-05-30  [facility] CM/SW Contact:  Curlene Labrum, RN Phone Number: 12/25/2019, 9:41 AM   Clinical Narrative:    Case management called the daughter, Brianna Porter, listed on the facesheet for a return call to followup for SNF choice.  Brianna Porter states that she would like her mother to be placed at either [facility] or [place].  She is calling the facility and will be paying extra for a private room.  I'm waiting on a return call from her this morning for Choice of SNF placement and I will confirm with the facility and start insurance authorization. Patient's daughter chose [facility] and message left with Lenward Chancellor, liaison, for patient's choice and bed availability.  12/25/19 1015 - Spoke with Lenward Chancellor with [facility] and patient has an available bed at the facility with private bed set up.  Lenward Chancellor is starting insurance authorization today and it maybe Monday or Tuesday before insurance Josem Kaufmann is back and patient is able to transfer.  Will continue to follow.  Patient will be a COVID in 48 hours of transport and recent PT progress note by this weekend.  Note left for PT.  Will continue to follow.   Final next level of care: Skilled Nursing Facility Barriers to Discharge: [place] [facility])   Patient Goals and CMS Choice Patient states their goals for this hospitalization and ongoing recovery are:: I need to get stronger and walk again CMS Medicare.gov Compare Post Acute Care list provided to:: Patient Choice offered to / list presented to : Patient, Adult Children  Discharge Placement    Discharge Plan and Services     Post Acute Care Choice: [place]                               Social Determinants of Health (SDOH) Interventions     Readmission Risk  Interventions Readmission Risk Prevention Plan 07/13/2019  Transportation Screening Complete  PCP or Specialist Appt within 3-5 Days Complete  HRI or [place] Complete  Social Work Consult for [place] Planning/Counseling Complete  Palliative Care Screening Not Applicable  Medication Review [employer]) Complete

## 2019-12-26 LAB — BASIC METABOLIC PANEL
Anion gap: 11 (ref 5–15)
BUN: 26 mg/dL — ABNORMAL HIGH (ref 6–20)
CO2: 24 mmol/L (ref 22–32)
Calcium: 9.5 mg/dL (ref 8.9–10.3)
Chloride: 100 mmol/L (ref 98–111)
Creatinine, Ser: 1.96 mg/dL — ABNORMAL HIGH (ref 0.44–1.00)
GFR calc Af Amer: 32 mL/min — ABNORMAL LOW (ref >60)
GFR calc non Af Amer: 28 mL/min — ABNORMAL LOW (ref >60)
Glucose, Bld: 141 mg/dL — ABNORMAL HIGH (ref 70–99)
Potassium: 3.8 mmol/L (ref 3.5–5.1)
Sodium: 135 mmol/L (ref 135–145)

## 2019-12-26 LAB — CBC
HCT: 25.3 % — ABNORMAL LOW (ref 36.0–46.0)
Hemoglobin: 7.9 g/dL — ABNORMAL LOW (ref 12.0–15.0)
MCH: 31.9 pg (ref 26.0–34.0)
MCHC: 31.2 g/dL (ref 30.0–36.0)
MCV: 102 fL — ABNORMAL HIGH (ref 80.0–100.0)
Platelets: 362 10*3/uL (ref 150–400)
RBC: 2.48 MIL/uL — ABNORMAL LOW (ref 3.87–5.11)
RDW: 14.4 % (ref 11.5–15.5)
WBC: 6.1 10*3/uL (ref 4.0–10.5)
nRBC: 0.5 % — ABNORMAL HIGH (ref 0.0–0.2)

## 2019-12-26 NOTE — Progress Notes (Signed)
PROGRESS NOTE    Brianna Porter  [id number] DOB: 28-May-1961 DOA: 12/22/2019 PCP: Cher Nakai, MD   Brief Narrative: 59 year old female with history of systolic CHF, A. fib/flutter on Eliquis, hypothyroidism, gastric ulcers, CAD, CKD IIIa, depression, anemia of chronic disease presented to the ED after 2 ground-level falls within 24 hours due to imbalance, generalized weakness.  Patient reported she falls very easily and her legs also "give out on her".  In the ED on initial evaluation was sleepy but denied any fever chills acute pain.  She lives with her daughter. In the ED found to have ecchymosis in multiple areas as of bruising as well as bleeding and mildly displaced blowout fracture of left orbital floor, AVULSION fracture involving the LEFT lateral tibia, CT surgery of head, CT C-spine, chest x-ray, CT maxillofacial-no acute intracranial abnormalities are spine abnormalities.  Case was discussed with the ENT physician and orthopedic by the ED provider and advised outpatient follow-up and no acute intervention. Sodium was low at 131 suspect to be symptomatic and given her generalized weakness recurrent falls admitted with PT OT speech social work evaluation. Patient had slight increase in creatinine given IV fluids, hemoglobin slightly downtrending- is on eliquis  Subjective: Seen and examined this morning.  On room air.  Nursing reported patient ambulated to the bathroom without issues. Overnight no fever, saturating well on room air. Creatinine at 1.9 hemoglobin 7.9 g this morning. Patient awaiting a skilled nursing facility placement. Multiple bruises and ecchymosis generalized.  Assessment & Plan:  Orbital fracture, EDP discussed with the ENT on admission, will need outpatient follow-up.  No acute intervention needed.Follow up outpatient with Dr. Bettye Boeck tubercle fracture: wt beight bearing as tolerated, cont knee mobilizer if significant pain with standing, continue as  needed analgesics and outpatient follow-up with orthopedics.  Facial abrasion-bleeding- pt reluctant to change dressing- wound care on board.  Shoulder pain:No fractures.  This limits examination of arm strength. 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  Last notes suggest she was supposed to be off methimazole, but she reported to [facility] tech she was still taking.  Methimazole stopped on admission.  Free T4 less than 0.25, T3 total less than 20  CAD/NICM/Atrial fibrillation, chronic/Hypertension: Previous echo shows EF 20 to 25% in September/2020.  Pressure is well controlled.  With junctional rhythm.  She will continue with Eliquis since hemoglobin is stable and no significant bleeding.  Continue amiodarone.  Blood pressure improving.  Continue metoprolol, Lasix holding losartan.  No significant edema, no shortness of breath and lungs are clear, tolerating gentle IV fluids.  Depression: Mood is stable continue her duloxetine and bupropion.  AKI on CKD IIIa: creat in feb and march was 2.0 range but on admission 1.58 down to 1.3 overnight 2.0.  Added gentle IV fluids,x12 hrs and creat at 1.9.Cont home lasix, hold losartan Recent Labs  Lab 12/22/19 1806 12/23/19 0425 12/24/19 0406 12/25/19 0356 12/26/19 0313  BUN 25* 22* 27* 27* 26*  CREATININE 1.58* 1.31* 2.03* 2.00* 1.96*   Hyponatremia sodium stable.Monitor.  Anemia of chronic disease : Hemoglobin is downtrending . She is on Eliquis  And if has ongoing low hemoglobin consider holding it.  She does a sacralized area of ecchymosis/subcutaneous bleeding?,  Not new and chronic.  Denies any black stool or blood in the stool. Recent Labs  Lab 12/22/19 1806 12/23/19 0425 12/24/19 0406 12/25/19 [id number]  12/26/19 0313  HGB 10.2* 8.1* 9.0* 8.1* 7.9*  HCT 32.8* 25.9* 27.9* 25.4* 25.3*   DVT prophylaxis:  Eliquis Code Status: FULL Family Communication: plan of care discussed with patient at bedside. Status is: Observation  The patient will require care spanning > 2 midnights and should be moved to inpatient because: Hemodynamically unstable, Ongoing active pain requiring inpatient pain management, IV treatments appropriate due to intensity of illness or inability to take PO and Inpatient level of care appropriate due to severity of illness   Dispo: The patient is from: Home              Anticipated d/c is to: SNF              Anticipated d/c date is: 1 day              Patient currently is medically stable for discahrge. Barriers to discharge awaiting on PASRR  Nutrition: Diet Order            Diet Heart Room service appropriate? No; Fluid consistency: Thin  Diet effective now             Body mass index is 27.62 kg/m. Consultants:see note  Procedures:see note Microbiology:see note  Medications: Scheduled Meds: . amiodarone  200 mg Oral BID  . apixaban  5 mg Oral BID  . vitamin C  1,000 mg Oral Daily  . buPROPion  150 mg Oral Daily  . cholecalciferol  1,000 Units Oral Daily  . DULoxetine  120 mg Oral Daily  . fenofibrate  54 mg Oral Daily  . ferrous sulfate  325 mg Oral Daily  . furosemide  40 mg Oral Daily  . loratadine  10 mg Oral Daily  . metoprolol succinate  100 mg Oral Daily  . pantoprazole  40 mg Oral BID  . sodium chloride flush  3 mL Intravenous Q12H   Continuous Infusions:   Antimicrobials: Anti-infectives (From admission, onward)   None       Objective: Vitals: Today's Vitals   12/25/19 2003 12/25/19 2026 12/25/19 2126 12/26/19 0434  BP: (!) 156/71   140/69  Pulse: 64   62  Resp: 16   18  Temp: 98.3 F (36.8 C)   (!) 97.4 F (36.3 C)  TempSrc: Oral   Oral  SpO2: 99%   99%  Weight:      Height:      PainSc:  8  2      Intake/Output Summary (Last 24 hours) at 12/26/2019 0810 Last data filed at 12/25/2019 1819 Gross per 24 hour  Intake  1362.98 ml  Output 400 ml  Net 962.98 ml   Filed Weights   12/25/19 0906  Weight: 68.5 kg   Weight change:    Intake/Output from previous day: 05/07 0701 - 05/08 0700 In: [id number] [P.O.:840; I.V.:523] Out: 400 [Urine:400] Intake/Output this shift: No intake/output data recorded.  Examination:  General exam: AAO x3, weak frail, on room air.   HEENT:Oral mucosa moist, Ear/Nose WNL grossly,dentition normal. Respiratory system: bilaterally clear,no wheezing or crackles,no use of accessory muscle, non tender. Cardiovascular system: S1 & S2 +, regular, No JVD. Gastrointestinal system: Abdomen soft, NT,ND, BS+. Nervous System:Alert, awake, moving extremities and grossly nonfocal Extremities: No edema, distal peripheral pulses palpable.  Skin: bruises on skin generalized, left facial bruise +, dressing intact. MSK: Normal muscle bulk,tone, power  Data Reviewed: I have personally reviewed following labs and imaging studies CBC: Recent Labs  Lab 12/22/19 1806 12/23/19 0425  12/24/19 0406 12/25/19 0356 12/26/19 0313  WBC 8.0 5.8 6.9 5.8 6.1  NEUTROABS 6.1  --   --   --   --   HGB 10.2* 8.1* 9.0* 8.1* 7.9*  HCT 32.8* 25.9* 27.9* 25.4* 25.3*  MCV 102.5* 103.2* 99.6 102.0* 102.0*  PLT 402* 301 376 361 [id number]   Basic Metabolic Panel: Recent Labs  Lab 12/22/19 1806 12/23/19 0425 12/24/19 0406 12/25/19 0356 12/26/19 0313  NA 131* 133* 133* 134* 135  K 4.7 4.6 3.8 4.3 3.8  CL 98 102 98 101 100  CO2 25 22 24 25 24   GLUCOSE 115* 103* 128* 141* 141*  BUN 25* 22* 27* 27* 26*  CREATININE 1.58* 1.31* 2.03* 2.00* 1.96*  CALCIUM 9.4 8.1* 9.6 9.1 9.5   GFR: Estimated Creatinine Clearance: 28.4 mL/min (A) (by C-G formula based on SCr of 1.96 mg/dL (H)). Liver Function Tests: Recent Labs  Lab 12/22/19 1806  AST 43*  ALT 63*  ALKPHOS 50  BILITOT 0.5  PROT 6.6  ALBUMIN 4.1   No results for input(s): LIPASE, AMYLASE in the last 168 hours. No results for input(s): AMMONIA in the  last 168 hours. Coagulation Profile: Recent Labs  Lab 12/22/19 1806 12/23/19 0425  INR 1.3* 1.3*   Cardiac Enzymes: No results for input(s): CKTOTAL, CKMB, CKMBINDEX, TROPONINI in the last 168 hours. BNP (last 3 results) Recent Labs    04/23/19 1233 06/12/19 1705 10/16/19 1020  PROBNP 3,353* 12,215* 449*   HbA1C: No results for input(s): HGBA1C in the last 72 hours. CBG: No results for input(s): GLUCAP in the last 168 hours. Lipid Profile: No results for input(s): CHOL, HDL, LDLCALC, TRIG, CHOLHDL, LDLDIRECT in the last 72 hours. Thyroid Function Tests: Recent Labs    12/23/19 1645  TSH 55.003*  FREET4 <0.25*   Anemia Panel: No results for input(s): VITAMINB12, FOLATE, FERRITIN, TIBC, IRON, RETICCTPCT in the last 72 hours. Sepsis Labs: No results for input(s): PROCALCITON, LATICACIDVEN in the last 168 hours.  Recent Results (from the past 240 hour(s))  Respiratory Panel by RT PCR (Flu A&B, Covid) - Nasopharyngeal Swab     Status: None   Collection Time: 12/22/19  6:22 PM   Specimen: Nasopharyngeal Swab  Result Value Ref Range Status   SARS Coronavirus 2 by RT PCR NEGATIVE NEGATIVE Final    Comment: (NOTE) SARS-CoV-2 target nucleic acids are NOT DETECTED. The SARS-CoV-2 RNA is generally detectable in upper respiratoy specimens during the acute phase of infection. The lowest concentration of SARS-CoV-2 viral copies this assay can detect is 131 copies/mL. A negative result does not preclude SARS-Cov-2 infection and should not be used as the sole basis for treatment or other patient management decisions. A negative result may occur with  improper specimen collection/handling, submission of specimen other than nasopharyngeal swab, presence of viral mutation(s) within the areas targeted by this assay, and inadequate number of viral copies (<131 copies/mL). A negative result must be combined with clinical observations, patient history, and epidemiological information.  The expected result is Negative. Fact Sheet for Patients:  PinkCheek.be Fact Sheet for Healthcare Providers:  GravelBags.it This test is not yet ap proved or cleared by the [place] FDA and  has been authorized for detection and/or diagnosis of SARS-CoV-2 by FDA under an Emergency Use Authorization (EUA). This EUA will remain  in effect (meaning this test can be used) for the duration of the COVID-19 declaration under Section 564(b)(1) of the Act, 21 U.S.C. section 360bbb-3(b)(1), unless the authorization is terminated  or revoked sooner.    Influenza A by PCR NEGATIVE NEGATIVE Final   Influenza B by PCR NEGATIVE NEGATIVE Final    Comment: (NOTE) The Xpert Xpress SARS-CoV-2/FLU/RSV assay is intended as an aid in  the diagnosis of influenza from Nasopharyngeal swab specimens and  should not be used as a sole basis for treatment. Nasal washings and  aspirates are unacceptable for Xpert Xpress SARS-CoV-2/FLU/RSV  testing. Fact Sheet for Patients: PinkCheek.be Fact Sheet for Healthcare Providers: GravelBags.it This test is not yet approved or cleared by the [place] FDA and  has been authorized for detection and/or diagnosis of SARS-CoV-2 by  FDA under an Emergency Use Authorization (EUA). This EUA will remain  in effect (meaning this test can be used) for the duration of the  Covid-19 declaration under Section 564(b)(1) of the Act, 21  U.S.C. section 360bbb-3(b)(1), unless the authorization is  terminated or revoked. Performed at [facility], [place] [address], [place], [place] [address]       Radiology Studies: No results found.   LOS: 2 days   Time spent: More than 50% of that time was spent in counseling and/or coordination of care.  Antonieta Pert, MD [facility]  12/26/2019, 8:10 AM

## 2019-12-26 NOTE — Plan of Care (Signed)

## 2019-12-27 LAB — CBC
HCT: 25.9 % — ABNORMAL LOW (ref 36.0–46.0)
Hemoglobin: 8.1 g/dL — ABNORMAL LOW (ref 12.0–15.0)
MCH: 31.6 pg (ref 26.0–34.0)
MCHC: 31.3 g/dL (ref 30.0–36.0)
MCV: 101.2 fL — ABNORMAL HIGH (ref 80.0–100.0)
Platelets: 374 10*3/uL (ref 150–400)
RBC: 2.56 MIL/uL — ABNORMAL LOW (ref 3.87–5.11)
RDW: 14.6 % (ref 11.5–15.5)
WBC: 6.8 10*3/uL (ref 4.0–10.5)
nRBC: 0.4 % — ABNORMAL HIGH (ref 0.0–0.2)

## 2019-12-27 LAB — BASIC METABOLIC PANEL
Anion gap: 12 (ref 5–15)
BUN: 26 mg/dL — ABNORMAL HIGH (ref 6–20)
CO2: 24 mmol/L (ref 22–32)
Calcium: 9.2 mg/dL (ref 8.9–10.3)
Chloride: 96 mmol/L — ABNORMAL LOW (ref 98–111)
Creatinine, Ser: 1.68 mg/dL — ABNORMAL HIGH (ref 0.44–1.00)
GFR calc Af Amer: 38 mL/min — ABNORMAL LOW (ref >60)
GFR calc non Af Amer: 33 mL/min — ABNORMAL LOW (ref >60)
Glucose, Bld: 133 mg/dL — ABNORMAL HIGH (ref 70–99)
Potassium: 3.6 mmol/L (ref 3.5–5.1)
Sodium: 132 mmol/L — ABNORMAL LOW (ref 135–145)

## 2019-12-27 MED ORDER — MECLIZINE HCL 12.5 MG PO TABS
12.5000 mg | ORAL_TABLET | Freq: Three times a day (TID) | ORAL | Status: DC | PRN
  Administered 2019-12-27: 12.5 mg via ORAL
  Filled 2019-12-27 (×2): qty 1

## 2019-12-27 NOTE — Plan of Care (Signed)
  Problem: Pain Managment: Goal: General experience of comfort will improve Outcome: Progressing   Problem: Safety: Goal: Ability to remain free from injury will improve Outcome: Progressing   Problem: Skin Integrity: Goal: Risk for impaired skin integrity will decrease Outcome: Progressing   

## 2019-12-27 NOTE — Progress Notes (Signed)
Pt did not want bandage changed on face or arms. She stated they were done yesterday and that she didn't want to go through it again today. All bandages are without drainage and intact.

## 2019-12-27 NOTE — Progress Notes (Signed)
PROGRESS NOTE    Brianna Porter  [id number] DOB: 02/02/1961 DOA: 12/22/2019 PCP: Cher Nakai, MD   Brief Narrative: 59 year old female with history of systolic CHF, A. fib/flutter on Eliquis, hypothyroidism, gastric ulcers, CAD, CKD IIIa, depression, anemia of chronic disease presented to the ED after 2 ground-level falls within 24 hours due to imbalance, generalized weakness.  Patient reported she falls very easily and her legs also "give out on her".  In the ED on initial evaluation was sleepy but denied any fever chills acute pain.  She lives with her daughter. In the ED found to have ecchymosis in multiple areas as of bruising as well as bleeding and mildly displaced blowout fracture of left orbital floor, AVULSION fracture involving the LEFT lateral tibia, CT surgery of head, CT C-spine, chest x-ray, CT maxillofacial-no acute intracranial abnormalities are spine abnormalities.  Case was discussed with the ENT physician and orthopedic by the ED provider and advised outpatient follow-up and no acute intervention. Sodium was low at 131 suspect to be symptomatic and given her generalized weakness recurrent falls admitted with PT OT speech social work evaluation. Patient had slight increase in creatinine given IV fluids, hemoglobin slightly downtrending- is on eliquis Patient is overall medically stable awaiting for skilled nursing fatty placement pending PASAR level II.  Subjective:  No acute events overnight.  Hemodynamically stable, on room air.   Patient awaiting a skilled nursing facility placement. Multiple bruises and ecchymosis generalized SKIN.  Assessment & Plan:  Orbital fracture, EDP discussed with the ENT on admission, will need outpatient follow-up.  No acute intervention needed.Follow up outpatient with Dr. Bettye Boeck tubercle fracture: wt beight bearing as tolerated, cont knee mobilizer if significant pain with standing, continue as needed analgesics and outpatient  follow-up with orthopedics.  Facial abrasion-bleeding- pt reluctant to change dressing- wound care on board.  Shoulder pain:No fractures.  This limits examination of arm strength. 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  Last notes suggest she was supposed to be off methimazole, but she reported to [facility] tech she was still taking.  Methimazole stopped on admission.  Free T4 less than 0.25, T3 total less than 20  CAD/NICM/Atrial fibrillation, chronic/Hypertension: Previous echo shows EF 20 to 25% in September/2020.  Pressure is well controlled.  With junctional rhythm.  She will continue with Eliquis since hemoglobin is stable and no significant bleeding.  Continue amiodarone, metoprolol, Lasix, holding losartan for elevated creat- resume on d/c.No significant edema, no shortness of breath and lungs are clear.   Depression: Mood is stable continue her duloxetine and bupropion. On high dose cymbalta and reports her pcp has been giving this.  Chronic dizziness- check orthostatics and add meclizine. CT had neg on admission. Report songoing for few yrs. Reluctant to try new meds.  AKI on CKD IIIa: creat in feb and march was 2.0 range but on admission 1.58 >1.3>> 2.0, helpd losartan and gave iv gently- Creat now at 1.6. Recent Labs  Lab 12/23/19 0425 12/24/19 0406 12/25/19 0356 12/26/19 0313 12/27/19 0347  BUN 22* 27* 27* 26* 26*  CREATININE 1.31* 2.03* 2.00* 1.96* 1.68*   Hyponatremia sodium stable.Monitor.  Anemia of chronic disease : Hemoglobin is overall stable.She is on Eliquis  And if has ongoing low hemoglobin consider holding it.  She  Has multiple areas of  Ecchymosis but not new and is chronic.  Denies any black stool  or blood in the stool. Recent Labs  Lab 12/23/19 0425 12/24/19 0406 12/25/19 0356 12/26/19 0313 12/27/19 0347  HGB 8.1* 9.0* 8.1*  7.9* 8.1*  HCT 25.9* 27.9* 25.4* 25.3* 25.9*   DVT prophylaxis: Eliquis Code Status: FULL Family Communication: plan of care discussed with patient at bedside. Status is: Observation  The patient will require care spanning > 2 midnights and should be moved to inpatient because: Hemodynamically unstable, Ongoing active pain requiring inpatient pain management, IV treatments appropriate due to intensity of illness or inability to take PO and Inpatient level of care appropriate due to severity of illness   Dispo: The patient is from: Home              Anticipated d/c is to: SNF              Anticipated d/c date is: 1 day              Patient currently is medically stable for discharge. Barriers to discharge awaiting on PASRR/State approval.  Nutrition: Diet Order            Diet Heart Room service appropriate? No; Fluid consistency: Thin  Diet effective now             Body mass index is 27.62 kg/m. Consultants:see note  Procedures:see note Microbiology:see note  Medications: Scheduled Meds: . amiodarone  200 mg Oral BID  . apixaban  5 mg Oral BID  . vitamin C  1,000 mg Oral Daily  . buPROPion  150 mg Oral Daily  . cholecalciferol  1,000 Units Oral Daily  . DULoxetine  120 mg Oral Daily  . fenofibrate  54 mg Oral Daily  . ferrous sulfate  325 mg Oral Daily  . furosemide  40 mg Oral Daily  . loratadine  10 mg Oral Daily  . metoprolol succinate  100 mg Oral Daily  . pantoprazole  40 mg Oral BID  . sodium chloride flush  3 mL Intravenous Q12H   Continuous Infusions:   Antimicrobials: Anti-infectives (From admission, onward)   None       Objective: Vitals: Today's Vitals   12/26/19 1935 12/26/19 2138 12/27/19 0434 12/27/19 0900  BP: 133/61  (!) 152/78 (!) 145/67  Pulse: 60  (!) 58 63  Resp: 16  14 15   Temp: 97.6 F (36.4 C)  97.7 F (36.5 C) 98.2 F (36.8 C)  TempSrc: Oral  Oral Oral  SpO2: 99%  100% 100%  Weight:      Height:      PainSc:  6      No  intake or output data in the 24 hours ending 12/27/19 1006 Filed Weights   12/25/19 0906  Weight: 68.5 kg   Weight change:    Intake/Output from previous day: No intake/output data recorded. Intake/Output this shift: No intake/output data recorded.  Examination:  General exam: AAO x3, weak frail on room air not in acute distress.   HEENT:Oral mucosa moist, Ear/Nose WNL grossly,dentition normal. Respiratory system: bilaterally clear,no wheezing or crackles,no use of accessory muscle, non tender. Cardiovascular system: S1 & S2 +, regular, No JVD. Gastrointestinal system: Abdomen soft, NT,ND, BS+. Nervous System:Alert, awake, moving extremities and grossly nonfocal Extremities: No edema, distal peripheral pulses palpable.  Skin: bruises ++ on skin generalized, left facial bruise +, dressing intact. MSK: Normal muscle bulk,tone, power  Data Reviewed: I have personally reviewed following labs and imaging studies CBC: Recent Labs  Lab 12/22/19 1806 12/22/19 1806 12/23/19 0425 12/24/19  0406 12/25/19 0356 12/26/19 0313 12/27/19 0347  WBC 8.0   < > 5.8 6.9 5.8 6.1 6.8  NEUTROABS 6.1  --   --   --   --   --   --   HGB 10.2*   < > 8.1* 9.0* 8.1* 7.9* 8.1*  HCT 32.8*   < > 25.9* 27.9* 25.4* 25.3* 25.9*  MCV 102.5*   < > 103.2* 99.6 102.0* 102.0* 101.2*  PLT 402*   < > 301 376 361 362 374   < > = values in this interval not displayed.   Basic Metabolic Panel: Recent Labs  Lab 12/23/19 0425 12/24/19 0406 12/25/19 0356 12/26/19 0313 12/27/19 0347  NA 133* 133* 134* 135 132*  K 4.6 3.8 4.3 3.8 3.6  CL 102 98 101 100 96*  CO2 22 24 25 24 24   GLUCOSE 103* 128* 141* 141* 133*  BUN 22* 27* 27* 26* 26*  CREATININE 1.31* 2.03* 2.00* 1.96* 1.68*  CALCIUM 8.1* 9.6 9.1 9.5 9.2   GFR: Estimated Creatinine Clearance: 33.1 mL/min (A) (by C-G formula based on SCr of 1.68 mg/dL (H)). Liver Function Tests: Recent Labs  Lab 12/22/19 1806  AST 43*  ALT 63*  ALKPHOS 50  BILITOT 0.5   PROT 6.6  ALBUMIN 4.1   No results for input(s): LIPASE, AMYLASE in the last 168 hours. No results for input(s): AMMONIA in the last 168 hours. Coagulation Profile: Recent Labs  Lab 12/22/19 1806 12/23/19 0425  INR 1.3* 1.3*   Cardiac Enzymes: No results for input(s): CKTOTAL, CKMB, CKMBINDEX, TROPONINI in the last 168 hours. BNP (last 3 results) Recent Labs    04/23/19 1233 06/12/19 1705 10/16/19 1020  PROBNP 3,353* 12,215* 449*   HbA1C: No results for input(s): HGBA1C in the last 72 hours. CBG: No results for input(s): GLUCAP in the last 168 hours. Lipid Profile: No results for input(s): CHOL, HDL, LDLCALC, TRIG, CHOLHDL, LDLDIRECT in the last 72 hours. Thyroid Function Tests: No results for input(s): TSH, T4TOTAL, FREET4, T3FREE, THYROIDAB in the last 72 hours. Anemia Panel: No results for input(s): VITAMINB12, FOLATE, FERRITIN, TIBC, IRON, RETICCTPCT in the last 72 hours. Sepsis Labs: No results for input(s): PROCALCITON, LATICACIDVEN in the last 168 hours.  Recent Results (from the past 240 hour(s))  Respiratory Panel by RT PCR (Flu A&B, Covid) - Nasopharyngeal Swab     Status: None   Collection Time: 12/22/19  6:22 PM   Specimen: Nasopharyngeal Swab  Result Value Ref Range Status   SARS Coronavirus 2 by RT PCR NEGATIVE NEGATIVE Final    Comment: (NOTE) SARS-CoV-2 target nucleic acids are NOT DETECTED. The SARS-CoV-2 RNA is generally detectable in upper respiratoy specimens during the acute phase of infection. The lowest concentration of SARS-CoV-2 viral copies this assay can detect is 131 copies/mL. A negative result does not preclude SARS-Cov-2 infection and should not be used as the sole basis for treatment or other patient management decisions. A negative result may occur with  improper specimen collection/handling, submission of specimen other than nasopharyngeal swab, presence of viral mutation(s) within the areas targeted by this assay, and inadequate  number of viral copies (<131 copies/mL). A negative result must be combined with clinical observations, patient history, and epidemiological information. The expected result is Negative. Fact Sheet for Patients:  PinkCheek.be Fact Sheet for Healthcare Providers:  GravelBags.it This test is not yet ap proved or cleared by the [place] FDA and  has been authorized for detection and/or diagnosis of SARS-CoV-2 by  FDA under an Emergency Use Authorization (EUA). This EUA will remain  in effect (meaning this test can be used) for the duration of the COVID-19 declaration under Section 564(b)(1) of the Act, 21 U.S.C. section 360bbb-3(b)(1), unless the authorization is terminated or revoked sooner.    Influenza A by PCR NEGATIVE NEGATIVE Final   Influenza B by PCR NEGATIVE NEGATIVE Final    Comment: (NOTE) The Xpert Xpress SARS-CoV-2/FLU/RSV assay is intended as an aid in  the diagnosis of influenza from Nasopharyngeal swab specimens and  should not be used as a sole basis for treatment. Nasal washings and  aspirates are unacceptable for Xpert Xpress SARS-CoV-2/FLU/RSV  testing. Fact Sheet for Patients: PinkCheek.be Fact Sheet for Healthcare Providers: GravelBags.it This test is not yet approved or cleared by the [place] FDA and  has been authorized for detection and/or diagnosis of SARS-CoV-2 by  FDA under an Emergency Use Authorization (EUA). This EUA will remain  in effect (meaning this test can be used) for the duration of the  Covid-19 declaration under Section 564(b)(1) of the Act, 21  U.S.C. section 360bbb-3(b)(1), unless the authorization is  terminated or revoked. Performed at [facility], [place] [address] [place], [place] [address]       Radiology Studies: No results found.   LOS: 3 days   Time spent: More than 50% of that time was spent in  counseling and/or coordination of care.  Antonieta Pert, MD [facility]  12/27/2019, 10:06 AM

## 2019-12-27 NOTE — Progress Notes (Signed)
Pt stated she wanted to wait until later today to get dressing changed since the dressing was done late yesterday. RN to report to oncoming nurse the patient's request. Dressings intact.

## 2019-12-27 NOTE — Progress Notes (Signed)
Pt refused morning medications at this time. She asked RN to come back around noon and she would take them then.

## 2019-12-27 NOTE — Progress Notes (Signed)
Physical Therapy Treatment Patient Details Name: Brianna Porter MRN: [id number] DOB: [birth year] Today's Date: 12/27/2019    History of Present Illness 59 y.o. female with medical history significant of systolic heart failure, CKD, A. fib/flutter on apixaban, hyperthyroidism, gastric ulcers, and CAD who presents after 2 ground-level falls within 24 hours. Found to have L tibial avulsion fx and L orbital floor fx.     PT Comments    Pt initially refusing physical therapy due to headache but agreeable to participate upon return. Making steady progress towards her physical therapy goals. Ambulating 100 feet with a walker at a min guard assist level. Rest of session focused on bed level exercises for strengthening. Pt continues with weakness, balance impairments, gait abnormalities and decreased endurance. Presents as a high fall risk based on decreased gait speed (0.95 ft/s) and history of recurrent falls (x5 recent falls). Continue to recommend SNF for ongoing Physical Therapy.     Follow Up Recommendations  SNF;Supervision/Assistance - 24 hour     Equipment Recommendations  Wheelchair (measurements PT)    Recommendations for Other Services       Precautions / Restrictions Precautions Precautions: Fall Restrictions Weight Bearing Restrictions: No LLE Weight Bearing: Weight bearing as tolerated    Mobility  Bed Mobility Overal bed mobility: Needs Assistance Bed Mobility: Supine to Sit;Sit to Supine     Supine to sit: Min guard Sit to supine: Min assist   General bed mobility comments: MinA to assist LLE back into bed upon return  Transfers Overall transfer level: Needs assistance Equipment used: Rolling walker (2 wheeled) Transfers: Sit to/from Stand Sit to Stand: Min guard            Ambulation/Gait Ambulation/Gait assistance: Min guard Gait Distance (Feet): 100 Feet Assistive device: Rolling walker (2 wheeled) Gait Pattern/deviations: Step-through  pattern;Antalgic;Decreased stride length;Decreased stance time - left;Trendelenburg Gait velocity: 0.96 ft/s Gait velocity interpretation: <1.31 ft/sec, indicative of household ambulator General Gait Details: Cues for walker proximity, min guard for balance. Noted decreased bilateral foot clearance (particularly with fatigue) and antalgic gait pattern.   Stairs             Wheelchair Mobility    Modified Rankin (Stroke Patients Only)       Balance Overall balance assessment: Needs assistance Sitting-balance support: No upper extremity supported Sitting balance-Leahy Scale: Good     Standing balance support: Bilateral upper extremity supported;During functional activity Standing balance-Leahy Scale: Fair                              Cognition Arousal/Alertness: Awake/alert Behavior During Therapy: WFL for tasks assessed/performed Overall Cognitive Status: No family/caregiver present to determine baseline cognitive functioning                                 General Comments: Decreased safety awareness with poor insight to deficits.       Exercises General Exercises - Lower Extremity Long Arc Quad: Both;10 reps;Seated Heel Slides: Both;10 reps;Supine Straight Leg Raises: Both;10 reps;Supine Hip Flexion/Marching: Both;10 reps;Seated    General Comments        Pertinent Vitals/Pain Pain Assessment: Faces Faces Pain Scale: Hurts little more Pain Location: headache Pain Descriptors / Indicators: Headache Pain Intervention(s): Monitored during session    Home Living  Prior Function            PT Goals (current goals can now be found in the care plan section) Acute Rehab PT Goals Patient Stated Goal: to figure out why she is falling PT Goal Formulation: With patient Time For Goal Achievement: 01/06/20 Potential to Achieve Goals: Good Progress towards PT goals: Progressing toward goals     Frequency    Min 3X/week      PT Plan Current plan remains appropriate    Co-evaluation              AM-PAC PT "6 Clicks" Mobility   Outcome Measure  Help needed turning from your back to your side while in a flat bed without using bedrails?: None Help needed moving from lying on your back to sitting on the side of a flat bed without using bedrails?: A Little Help needed moving to and from a bed to a chair (including a wheelchair)?: A Little Help needed standing up from a chair using your arms (e.g., wheelchair or bedside chair)?: A Little Help needed to walk in hospital room?: A Little Help needed climbing 3-5 steps with a railing? : Total 6 Click Score: 17    End of Session Equipment Utilized During Treatment: Gait belt Activity Tolerance: Patient tolerated treatment well Patient left: with call bell/phone within reach;in chair;with chair alarm set Nurse Communication: Mobility status PT Visit Diagnosis: History of falling (Z91.81);Repeated falls (R29.6);Muscle weakness (generalized) (M62.81);Unsteadiness on feet (R26.81)     Time: [id number] PT Time Calculation (min) (ACUTE ONLY): 20 min  Charges:  $Gait Training: 8-22 mins                       Brianna Porter, PT, DPT Acute Rehabilitation Services Pager [phone] Office [phone]    Deno Etienne 12/27/2019, 12:51 PM

## 2019-12-28 LAB — SARS CORONAVIRUS 2 BY RT PCR (HOSPITAL ORDER, PERFORMED IN ~~LOC~~ HOSPITAL LAB): SARS Coronavirus 2: NEGATIVE

## 2019-12-28 NOTE — Plan of Care (Signed)
  Problem: Pain Managment: Goal: General experience of comfort will improve Outcome: Progressing   Problem: Safety: Goal: Ability to remain free from injury will improve Outcome: Progressing   Problem: Skin Integrity: Goal: Risk for impaired skin integrity will decrease Outcome: Progressing   

## 2019-12-28 NOTE — Progress Notes (Signed)
Patient did not want dressing change done to face and arms until later today. She stated she wanted to sleep and wait until later. Dressings are intact and without drainage. RN will continue to monitor

## 2019-12-28 NOTE — Progress Notes (Signed)
PROGRESS NOTE    Brianna Porter  [id number] DOB: [birth year] DOA: 12/22/2019 PCP: Cher Nakai, MD   Brief Narrative: 59 year old female with history of systolic CHF, A. fib/flutter on Eliquis, hypothyroidism, gastric ulcers, CAD, CKD IIIa, depression, anemia of chronic disease presented to the ED after 2 ground-level falls within 24 hours due to imbalance, generalized weakness.  Patient reported she falls very easily and her legs also "give out on her".  In the ED on initial evaluation was sleepy but denied any fever chills acute pain.  She lives with her daughter. In the ED found to have ecchymosis in multiple areas as of bruising as well as bleeding and mildly displaced blowout fracture of left orbital floor, AVULSION fracture involving the LEFT lateral tibia, CT surgery of head, CT C-spine, chest x-ray, CT maxillofacial-no acute intracranial abnormalities are spine abnormalities.  Case was discussed with the ENT physician and orthopedic by the ED provider and advised outpatient follow-up and no acute intervention. Sodium was low at 131 suspect to be symptomatic and given her generalized weakness recurrent falls admitted with PT OT speech social work evaluation. Patient had slight increase in creatinine given IV fluids, hemoglobin slightly downtrending- is on eliquis Patient is overall medically stable awaiting for skilled nursing fatty placement pending PASAR level II.  Subjective: Feeling well. dressing being changed on face Patient awaiting a skilled nursing facility placement. Multiple bruises and ecchymosis generalized SKIN. She is anxious to go to skilled nursing facility and wanted to talk with social worker today.  Assessment & Plan:  Orbital fracture, EDP discussed with the ENT on admission, will need outpatient follow-up.  No acute intervention needed.Follow up outpatient with Dr. Bettye Boeck tubercle fracture: wt beight bearing as tolerated, cont knee mobilizer if  significant pain with standing, continue as needed analgesics and outpatient follow-up with orthopedics.  Facial abrasion-bleeding- pt reluctant to change dressing- wound care on board.  Shoulder pain:No fractures.  This limits examination of arm strength. 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  Last notes suggest she was supposed to be off methimazole, but she reported to [facility] tech she was still taking.  Methimazole stopped on admission.  Free T4 less than 0.25, T3 total less than 20  CAD/NICM/Atrial fibrillation, chronic/Hypertension: Previous echo shows EF 20 to 25% in September/2020.  Pressure is well controlled.  With junctional rhythm.  She will continue with Eliquis since hemoglobin is stable and no significant bleeding.  Continue amiodarone, metoprolol, Lasix, holding losartan for elevated creat- resume on d/c.No significant edema, no shortness of breath and lungs are clear.   Depression: Mood is stable continue her duloxetine and bupropion. On high dose cymbalta and reports her pcp has been giving this.  Chronic dizziness- checked orthostatics- pt reports her BP did not drop. Cont prn meclizine.CT had neg on admission. Report ongoing for few yrs.  AKI on CKD IIIa: creat in feb and march was 2.0 range but on admission 1.58 >1.3>> 2.0, helpd losartan and gave iv gently- Creat now at 1.6. Recent Labs  Lab 12/23/19 0425 12/24/19 0406 12/25/19 0356 12/26/19 0313 12/27/19 0347  BUN 22* 27* 27* 26* 26*  CREATININE 1.31* 2.03* 2.00* 1.96* 1.68*   Hyponatremia sodium stable.Monitor.  Anemia of chronic disease : Hemoglobin is overall stable.She is on Eliquis  And if has ongoing low hemoglobin consider holding it.  She  Has multiple areas of  Ecchymosis but not new and is chronic.  Denies any black stool or blood in the stool. Recent Labs  Lab  12/23/19 0425 12/24/19 0406 12/25/19 0356 12/26/19 0313 12/27/19 0347  HGB 8.1* 9.0* 8.1* 7.9* 8.1*  HCT 25.9* 27.9* 25.4* 25.3* 25.9*   DVT prophylaxis: Eliquis Code Status: FULL Family Communication: plan of care discussed with patient at bedside. Status is: Observation  The patient will require care spanning > 2 midnights and should be moved to inpatient because: Hemodynamically unstable, Ongoing active pain requiring inpatient pain management, IV treatments appropriate due to intensity of illness or inability to take PO and Inpatient level of care appropriate due to severity of illness   Dispo: The patient is from: Home              Anticipated d/c is to: SNF              Anticipated d/c date is: 1 day              Patient currently is medically stable for discharge. Barriers to discharge awaiting on PASRR/State approval. Ordering COVID-19 swab for screening for possible discharge to skilled nursing facility tomorrow.  Nutrition: Diet Order            Diet Heart Room service appropriate? No; Fluid consistency: Thin  Diet effective now             Body mass index is 27.62 kg/m. Consultants:see note  Procedures:see note Microbiology:see note  Medications: Scheduled Meds: . amiodarone  200 mg Oral BID  . apixaban  5 mg Oral BID  . vitamin C  1,000 mg Oral Daily  . buPROPion  150 mg Oral Daily  . cholecalciferol  1,000 Units Oral Daily  . DULoxetine  120 mg Oral Daily  . fenofibrate  54 mg Oral Daily  . ferrous sulfate  325 mg Oral Daily  . furosemide  40 mg Oral Daily  . loratadine  10 mg Oral Daily  . metoprolol succinate  100 mg Oral Daily  . pantoprazole  40 mg Oral BID  . sodium chloride flush  3 mL Intravenous Q12H   Continuous Infusions:   Antimicrobials: Anti-infectives (From admission, onward)   None       Objective: Vitals: Today's Vitals   12/27/19 2229 12/27/19 2335 12/28/19 0343 12/28/19 0729  BP:   (!) 156/70   Pulse:   (!) 58   Resp:    16   Temp:   98.2 F (36.8 C)   TempSrc:   Oral   SpO2:   100%   Weight:      Height:      PainSc: 6  Asleep  Asleep    Intake/Output Summary (Last 24 hours) at 12/28/2019 1046 Last data filed at 12/28/2019 0900 Gross per 24 hour  Intake 120 ml  Output --  Net 120 ml   Filed Weights   12/25/19 0906  Weight: 68.5 kg   Weight change:    Intake/Output from previous day: No intake/output data recorded. Intake/Output this shift: Total I/O In: 120 [P.O.:120] Out: -   Examination:  General exam: AAO x3, NAD, on RA, left face with dressing in place, no drainage. HEENT:Oral mucosa moist, Ear/Nose WNL grossly,dentition normal. Respiratory system: bilaterally clear,no wheezing or crackles,no use of accessory muscle, non tender. Cardiovascular system: S1 & S2 +,regular, No JVD. Gastrointestinal system: Abdomen soft, NT,ND, BS+. Nervous System:Alert, awake, moving extremities and grossly nonfocal Extremities: No edema, distal peripheral pulses palpable.  Skin: bruises ++ on skin generalized, left facial bruise +, dressing intact. MSK: Normal muscle bulk,tone, power  Data Reviewed: I have personally reviewed following labs and imaging studies CBC: Recent Labs  Lab 12/22/19 1806 12/22/19 1806 12/23/19 0425 12/24/19 0406 12/25/19 0356 12/26/19 0313 12/27/19 0347  WBC 8.0   < > 5.8 6.9 5.8 6.1 6.8  NEUTROABS 6.1  --   --   --   --   --   --   HGB 10.2*   < > 8.1* 9.0* 8.1* 7.9* 8.1*  HCT 32.8*   < > 25.9* 27.9* 25.4* 25.3* 25.9*  MCV 102.5*   < > 103.2* 99.6 102.0* 102.0* 101.2*  PLT 402*   < > 301 376 361 362 374   < > = values in this interval not displayed.   Basic Metabolic Panel: Recent Labs  Lab 12/23/19 0425 12/24/19 0406 12/25/19 0356 12/26/19 0313 12/27/19 0347  NA 133* 133* 134* 135 132*  K 4.6 3.8 4.3 3.8 3.6  CL 102 98 101 100 96*  CO2 22 24 25 24 24   GLUCOSE 103* 128* 141* 141* 133*  BUN 22* 27* 27* 26* 26*  CREATININE 1.31* 2.03* 2.00* 1.96*  1.68*  CALCIUM 8.1* 9.6 9.1 9.5 9.2   GFR: Estimated Creatinine Clearance: 33.1 mL/min (A) (by C-G formula based on SCr of 1.68 mg/dL (H)). Liver Function Tests: Recent Labs  Lab 12/22/19 1806  AST 43*  ALT 63*  ALKPHOS 50  BILITOT 0.5  PROT 6.6  ALBUMIN 4.1   No results for input(s): LIPASE, AMYLASE in the last 168 hours. No results for input(s): AMMONIA in the last 168 hours. Coagulation Profile: Recent Labs  Lab 12/22/19 1806 12/23/19 0425  INR 1.3* 1.3*   Cardiac Enzymes: No results for input(s): CKTOTAL, CKMB, CKMBINDEX, TROPONINI in the last 168 hours. BNP (last 3 results) Recent Labs    04/23/19 1233 06/12/19 1705 10/16/19 1020  PROBNP 3,353* 12,215* 449*   HbA1C: No results for input(s): HGBA1C in the last 72 hours. CBG: No results for input(s): GLUCAP in the last 168 hours. Lipid Profile: No results for input(s): CHOL, HDL, LDLCALC, TRIG, CHOLHDL, LDLDIRECT in the last 72 hours. Thyroid Function Tests: No results for input(s): TSH, T4TOTAL, FREET4, T3FREE, THYROIDAB in the last 72 hours. Anemia Panel: No results for input(s): VITAMINB12, FOLATE, FERRITIN, TIBC, IRON, RETICCTPCT in the last 72 hours. Sepsis Labs: No results for input(s): PROCALCITON, LATICACIDVEN in the last 168 hours.  Recent Results (from the past 240 hour(s))  Respiratory Panel by RT PCR (Flu A&B, Covid) - Nasopharyngeal Swab     Status: None   Collection Time: 12/22/19  6:22 PM   Specimen: Nasopharyngeal Swab  Result Value Ref Range Status   SARS Coronavirus 2 by RT PCR NEGATIVE NEGATIVE Final    Comment: (NOTE) SARS-CoV-2 target nucleic acids are NOT DETECTED. The SARS-CoV-2 RNA is generally detectable in upper respiratoy specimens during the acute phase of infection. The lowest concentration of SARS-CoV-2 viral copies this assay can detect is 131 copies/mL. A negative result does not preclude SARS-Cov-2 infection and should not be used as the sole basis for treatment or other  patient management decisions. A negative result may occur with  improper specimen collection/handling, submission of specimen other than nasopharyngeal swab, presence of viral mutation(s) within the areas targeted by this assay, and inadequate number of viral copies (<131 copies/mL). A negative result must be combined with clinical observations, patient history, and epidemiological information. The expected result  is Negative. Fact Sheet for Patients:  PinkCheek.be Fact Sheet for Healthcare Providers:  GravelBags.it This test is not yet ap proved or cleared by the [place] FDA and  has been authorized for detection and/or diagnosis of SARS-CoV-2 by FDA under an Emergency Use Authorization (EUA). This EUA will remain  in effect (meaning this test can be used) for the duration of the COVID-19 declaration under Section 564(b)(1) of the Act, 21 U.S.C. section 360bbb-3(b)(1), unless the authorization is terminated or revoked sooner.    Influenza A by PCR NEGATIVE NEGATIVE Final   Influenza B by PCR NEGATIVE NEGATIVE Final    Comment: (NOTE) The Xpert Xpress SARS-CoV-2/FLU/RSV assay is intended as an aid in  the diagnosis of influenza from Nasopharyngeal swab specimens and  should not be used as a sole basis for treatment. Nasal washings and  aspirates are unacceptable for Xpert Xpress SARS-CoV-2/FLU/RSV  testing. Fact Sheet for Patients: PinkCheek.be Fact Sheet for Healthcare Providers: GravelBags.it This test is not yet approved or cleared by the [place] FDA and  has been authorized for detection and/or diagnosis of SARS-CoV-2 by  FDA under an Emergency Use Authorization (EUA). This EUA will remain  in effect (meaning this test can be used) for the duration of the  Covid-19 declaration under Section 564(b)(1) of the Act, 21  U.S.C. section 360bbb-3(b)(1), unless  the authorization is  terminated or revoked. Performed at [facility], [place] [address] [place], [place] [address]       Radiology Studies: No results found.   LOS: 4 days   Time spent: More than 50% of that time was spent in counseling and/or coordination of care.  Antonieta Pert, MD [facility]  12/28/2019, 10:46 AM

## 2019-12-28 NOTE — Plan of Care (Signed)

## 2019-12-28 NOTE — Progress Notes (Signed)
Message sent to [facility] on call to please write order for COVID test for D/C to SNF within 48 hours of discharge. RN wil continue to monitor.

## 2019-12-29 MED ORDER — MECLIZINE HCL 12.5 MG PO TABS: 12.5000 mg | ORAL_TABLET | Freq: Three times a day (TID) | ORAL | 0 refills | Status: DC | PRN

## 2019-12-29 NOTE — Discharge Summary (Signed)
Physician Discharge Summary  Brianna Porter [id number] DOB: 1960-08-27 DOA: 12/22/2019  PCP: Cher Nakai, MD  Admit date: 12/22/2019 Discharge date: 12/29/2019  Admitted From: home Disposition:  home  Recommendations for Outpatient Follow-up:  1. Follow up with PCP in 1-2 weeks 2. Please obtain BMP/CBC in one week 3. Please follow up on the following pending results:  Home Health:No  Equipment/Devices: None  Discharge Condition: Stable Code Status: FULL Diet recommendation:  Diet Order            Diet - low sodium heart healthy        Diet Heart Room service appropriate? No; Fluid consistency: Thin  Diet effective now               Brief/Interim Summary:  59 year old female with history of systolic CHF, A. fib/flutter on Eliquis, hypothyroidism, gastric ulcers, CAD, CKD IIIa, depression, anemia of chronic disease presented to the ED after 2 ground-level falls within 24 hours due to imbalance, generalized weakness.  Patient reported she falls very easily and her legs also "give out on her".  In the ED on initial evaluation was sleepy but denied any fever chills acute pain.  She lives with her daughter. In the ED found to have ecchymosis in multiple areas as of bruising as well as bleeding and mildly displaced blowout fracture of left orbital floor, AVULSION fracture involving the LEFT lateral tibia, CT surgery of head, CT C-spine, chest x-ray, CT maxillofacial-no acute intracranial abnormalities are spine abnormalities.  Case was discussed with the ENT physician and orthopedic by the ED provider and advised outpatient follow-up and no acute intervention. Sodium was low at 131 suspect to be symptomatic and given her generalized weakness recurrent falls admitted with PT OT speech social work evaluation. Patient had slight increase in creatinine given IV fluids, hemoglobin slightly downtrending- is on eliquis Patient is overall medically stable awaiting for skilled nursing fatty  placement pending PASAR level II.  Discharge Diagnoses:  Active Problems:   Fall  Orbital fracture, EDP discussed with the ENT on admission, will need outpatient follow-up.  No acute intervention needed.Follow up outpatient with Dr. Bettye Boeck tubercle fracture: wt beight bearing as tolerated, cont knee mobilizer if significant pain with standing, continue as needed analgesics and outpatient follow-up with orthopedics.  Facial abrasion-bleeding- pt reluctant to change dressing- wound care on board.  Shoulder pain:No fractures. This limits examination of arm strength. She will follow up with Orthopedics Cont PT/OT eval  Fall: Due to generalized weakness, benzodiazepine use-minimize use.  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 Last notes suggest she was supposed to be off methimazole, but she reported to [facility] tech she was still taking.  Methimazole stopped on admission.  Free T4 less than 0.25, T3 total less than 20,TSH check in 3 wks  CAD/NICM/Atrial fibrillation, chronic/Hypertension: Previous echo shows EF 20 to 25% in September/2020.  Pressure is well controlled.  With junctional rhythm.  She will continue with Eliquis since hemoglobin is stable and no significant bleeding.  Continue amiodarone, metoprolol, Lasix, holding losartan for elevated creat- resume on d/c.No significant edema, no shortness of breath and lungs are clear.   Depression: Mood is stable continue her duloxetine and bupropion. On high dose cymbalta and reports her pcp has been giving this.  Chronic dizziness- checked orthostatics- pt reports her BP did not drop. Cont prn meclizine.CT had neg on admission. Report ongoing for few yrs.  AKI on CKD IIIa: creat in feb and march was 2.0 range  but on admission 1.58 >1.3>> 2.0, helpd losartan and gave iv gently- Creat now at 1.6.  Please check BMP in a week at facility  Hyponatremia sodium improved  Anemia of chronic disease : Hemoglobin  is overall stable.She is on Eliquis  And if has ongoing low hemoglobin consider holding it.  She  Has multiple areas of  Ecchymosis but not new and is chronic.  Denies any black stool or blood in the stool. Please check CBC in a week and facility.  Wound care recommendation Wound type: traumatic injury Pressure Injury POA: Yes/No/NA Measurement: Complete removal of existing gauze and telfa dressing deferred as supplies needed for dressing must be ordered and arrive from materials management.  With very limited, very gentle lifting of existing dressing active bleeding to margin of wound on left face ensued. Wound bed: pink Drainage (amount, consistency, odor) bleeding Periwound: bruised Dressing procedure/placement/frequency: HEAVILY MOISTEN existing left face wound with saline BEFORE attempting to remove.  Place half of a Mepitel dressing Kellie Simmering [phone]) over the wound, directly against the skin--the Mepitel REMAINS IN PLACE UP TO 5 DAYS. Place a piece of Aquacel over the Mepitel. Then place dry gauze over the Aquacel. Frame with paper tape to hold in place. Change the Aquacel and dry gauze daily and prn soilage.  Leave the Mepitel in place up to 5 days. Mepitel and Aquacel are being ordered by unit secretary. For all extremity skin tears, I have ordered Xeroform Kellie Simmering #294) and kerlex. Monitor the wound area(s) for worsening of condition such as: Signs/symptoms of infection,  Increase in size,  Development of or worsening of odor, Development of pain, or increased pain at the affected locations.  Notify the medical team if any of these develop. Consults:  Sw, pt, ent and orthopedics by EDP  Subjective: Feeling well no nausea vomiting chest pain shortness of breath.  No new complaints. Frustrated that she was not able to go to SNF yesterday. Discharge Exam: Vitals:   12/29/19 0254 12/29/19 0744  BP: (!) 115/46 (!) 154/72  Pulse: (!) 57 67  Resp: 16 17  Temp: 97.7 F (36.5 C) (!)  97.4 F (36.3 C)  SpO2: 96% 98%   General: Pt is alert, awake, not in acute distress Cardiovascular: RRR, S1/S2 +, no rubs, no gallops Respiratory: CTA bilaterally, no wheezing, no rhonchi Abdominal: Soft, NT, ND, bowel sounds + Extremities: no edema, no cyanosis Left facial abrasion with dressing intact  Discharge Instructions  Discharge Instructions    Diet - low sodium heart healthy   Complete by: As directed    Discharge instructions   Complete by: As directed    Please check CBC and BMP in 1 week at the facility.  Please follow-up with the ENT doctor in 1 week for orbital fracture.  Follow-up wound care instruction attached in the discharge summary  Please call call MD or return to ER for similar or worsening recurring problem that brought you to hospital or if any fever,nausea/vomiting,abdominal pain, uncontrolled pain, chest pain,  shortness of breath or any other alarming symptoms.  Please follow-up your doctor as instructed in a week time and call the office for appointment.  Please avoid alcohol, smoking, or any other illicit substance and maintain healthy habits including taking your regular medications as prescribed.  You were cared for by a hospitalist during your hospital stay. If you have any questions about your discharge medications or the care you received while you were in the hospital after you are discharged, you  can call the unit and ask to speak with the hospitalist on call if the hospitalist that took care of you is not available.  Once you are discharged, your primary care physician will handle any further medical issues. Please note that NO REFILLS for any discharge medications will be authorized once you are discharged, as it is imperative that you return to your primary care physician (or establish a relationship with a primary care physician if you do not have one) for your aftercare needs so that they can reassess your need for medications and monitor  your lab values   Increase activity slowly   Complete by: As directed      Allergies as of 12/29/2019      Reactions   Ambien [zolpidem Tartrate]    Pt and family state this med makes the patient sleepwalk.   Adhesive [tape]    Tears skin, please use paper tape   Codeine Nausea And Vomiting      Medication List    STOP taking these medications   ALPRAZolam 0.5 MG tablet Commonly known as: XANAX   methimazole 5 MG tablet Commonly known as: TAPAZOLE   traMADol 50 MG tablet Commonly known as: ULTRAM     TAKE these medications   acetaminophen 325 MG tablet Commonly known as: TYLENOL Take 650 mg by mouth every 6 (six) hours as needed for mild pain.   albuterol 108 (90 Base) MCG/ACT inhaler Commonly known as: VENTOLIN HFA Inhale 2 puffs into the lungs every 6 (six) hours as needed for wheezing or shortness of breath.   amiodarone 200 MG tablet Commonly known as: Pacerone Take 1 tablet (200 mg total) by mouth daily. What changed: when to take this   apixaban 5 MG Tabs tablet Commonly known as: ELIQUIS Take 1 tablet (5 mg total) by mouth 2 (two) times daily.   buPROPion 150 MG 24 hr tablet Commonly known as: WELLBUTRIN XL Take 150 mg by mouth daily.   cetirizine 10 MG tablet Commonly known as: ZYRTEC Take 10 mg by mouth daily as needed for allergies.   cholecalciferol 25 MCG (1000 UNIT) tablet Commonly known as: VITAMIN D3 Take 1,000 Units by mouth daily.   DULoxetine 60 MG capsule Commonly known as: CYMBALTA Take 60 mg by mouth 2 (two) times daily.   fenofibrate 48 MG tablet Commonly known as: TRICOR Take 48 mg by mouth at bedtime.   ferrous sulfate 325 (65 FE) MG tablet Take 325 mg by mouth daily.   furosemide 40 MG tablet Commonly known as: LASIX Take 40 mg by mouth daily.   ICY HOT ADVANCED RELIEF EX Apply 1 application topically 3 (three) times daily as needed (pain).   losartan 50 MG tablet Commonly known as: COZAAR Take 50 mg by mouth  daily.   meclizine 12.5 MG tablet Commonly known as: ANTIVERT Take 1 tablet (12.5 mg total) by mouth 3 (three) times daily as needed for dizziness.   metoprolol succinate 100 MG 24 hr tablet Commonly known as: TOPROL-XL Take 1 tablet (100 mg total) by mouth daily. Take with or immediately following a meal.   pantoprazole 40 MG tablet Commonly known as: PROTONIX Take 40 mg by mouth 2 (two) times daily.   polyethylene glycol 17 g packet Commonly known as: MIRALAX / GLYCOLAX Take 17 g by mouth at bedtime as needed for moderate constipation.   vitamin C 1000 MG tablet Take 1,000 mg by mouth daily.      Follow-up Information  Cher Nakai, MD Follow up.   Specialty: Internal Medicine Contact information: [place] [address] [phone]        Richardo Priest, MD .   Specialty: Cardiology Contact information: [place] [address] [phone]        Constance Haw, MD .   Specialty: Cardiology Contact information: [place] [address] [phone]          Allergies  Allergen Reactions  . Ambien [Zolpidem Tartrate]     Pt and family state this med makes the patient sleepwalk.  . Adhesive [Tape]     Tears skin, please use paper tape  . Codeine Nausea And Vomiting    The results of significant diagnostics from this hospitalization (including imaging, microbiology, ancillary and laboratory) are listed below for reference.    Microbiology: Recent Results (from the past 240 hour(s))  Respiratory Panel by RT PCR (Flu A&B, Covid) - Nasopharyngeal Swab     Status: None   Collection Time: 12/22/19  6:22 PM   Specimen: Nasopharyngeal Swab  Result Value Ref Range Status   SARS Coronavirus 2 by RT PCR NEGATIVE NEGATIVE Final    Comment: (NOTE) SARS-CoV-2 target nucleic acids are NOT DETECTED. The SARS-CoV-2 RNA is generally detectable in upper respiratoy specimens during the acute phase of  infection. The lowest concentration of SARS-CoV-2 viral copies this assay can detect is 131 copies/mL. A negative result does not preclude SARS-Cov-2 infection and should not be used as the sole basis for treatment or other patient management decisions. A negative result may occur with  improper specimen collection/handling, submission of specimen other than nasopharyngeal swab, presence of viral mutation(s) within the areas targeted by this assay, and inadequate number of viral copies (<131 copies/mL). A negative result must be combined with clinical observations, patient history, and epidemiological information. The expected result is Negative. Fact Sheet for Patients:  PinkCheek.be Fact Sheet for Healthcare Providers:  GravelBags.it This test is not yet ap proved or cleared by the [place] FDA and  has been authorized for detection and/or diagnosis of SARS-CoV-2 by FDA under an Emergency Use Authorization (EUA). This EUA will remain  in effect (meaning this test can be used) for the duration of the COVID-19 declaration under Section 564(b)(1) of the Act, 21 U.S.C. section 360bbb-3(b)(1), unless the authorization is terminated or revoked sooner.    Influenza A by PCR NEGATIVE NEGATIVE Final   Influenza B by PCR NEGATIVE NEGATIVE Final    Comment: (NOTE) The Xpert Xpress SARS-CoV-2/FLU/RSV assay is intended as an aid in  the diagnosis of influenza from Nasopharyngeal swab specimens and  should not be used as a sole basis for treatment. Nasal washings and  aspirates are unacceptable for Xpert Xpress SARS-CoV-2/FLU/RSV  testing. Fact Sheet for Patients: PinkCheek.be Fact Sheet for Healthcare Providers: GravelBags.it This test is not yet approved or cleared by the [place] FDA and  has been authorized for detection and/or diagnosis of SARS-CoV-2 by  FDA under  an Emergency Use Authorization (EUA). This EUA will remain  in effect (meaning this test can be used) for the duration of the  Covid-19 declaration under Section 564(b)(1) of the Act, 21  U.S.C. section 360bbb-3(b)(1), unless the authorization is  terminated or revoked. Performed at [facility], [place] [address], [place], [place] [address]   SARS Coronavirus 2 by RT PCR (hospital order, performed in [facility] hospital lab) Nasopharyngeal Nasopharyngeal Swab  Status: None   Collection Time: 12/28/19  1:35 PM   Specimen: Nasopharyngeal Swab  Result Value Ref Range Status   SARS Coronavirus 2 NEGATIVE NEGATIVE Final    Comment: (NOTE) SARS-CoV-2 target nucleic acids are NOT DETECTED. The SARS-CoV-2 RNA is generally detectable in upper and lower respiratory specimens during the acute phase of infection. The lowest concentration of SARS-CoV-2 viral copies this assay can detect is 250 copies / mL. A negative result does not preclude SARS-CoV-2 infection and should not be used as the sole basis for treatment or other patient management decisions.  A negative result may occur with improper specimen collection / handling, submission of specimen other than nasopharyngeal swab, presence of viral mutation(s) within the areas targeted by this assay, and inadequate number of viral copies (<250 copies / mL). A negative result must be combined with clinical observations, patient history, and epidemiological information. Fact Sheet for Patients:   StrictlyIdeas.no Fact Sheet for Healthcare Providers: BankingDealers.co.za This test is not yet approved or cleared  by the [place] FDA and has been authorized for detection and/or diagnosis of SARS-CoV-2 by FDA under an Emergency Use Authorization (EUA).  This EUA will remain in effect (meaning this test can be used) for the duration of the COVID-19 declaration under Section 564(b)(1) of the  Act, 21 U.S.C. section 360bbb-3(b)(1), unless the authorization is terminated or revoked sooner. Performed at [facility], [place] [address], [place], [place] [address]     Procedures/Studies: [place] Pelvis 1-2 Views  Result Date: 12/22/2019 CLINICAL DATA:  Fall with bilateral hip pain EXAM: PELVIS - 1-2 VIEW COMPARISON:  07/10/2019 FINDINGS: There is no evidence of pelvic fracture or diastasis. No pelvic bone lesions are seen. IMPRESSION: Negative. Electronically Signed   By: Donavan Foil M.D.   On: 12/22/2019 18:40   DG Knee 2 Views Left  Result Date: 12/22/2019 CLINICAL DATA:  Recent fall. Knee pain. EXAM: LEFT KNEE - 1-2 VIEW COMPARISON:  None. FINDINGS: The joint spaces are fairly well maintained. Slight lateral joint space narrowing and early spurring. Possible tiny avulsion fracture involving the lateral tibia. Difficult to be certain because there is significant clothing artifact. Suspect suprapatellar knee joint effusion. IMPRESSION: 1. Possible tiny avulsion fracture involving the lateral tibia. 2. Suspect suprapatellar knee joint effusion. 3. Mild lateral joint space narrowing and early spurring. Electronically Signed   By: Marijo Sanes M.D.   On: 12/22/2019 18:43   CT Head Wo Contrast  Result Date: 12/22/2019 CLINICAL DATA:  Multiple falls, scalp laceration, Eliquis EXAM: CT HEAD WITHOUT CONTRAST CT MAXILLOFACIAL WITHOUT CONTRAST CT CERVICAL SPINE WITHOUT CONTRAST TECHNIQUE: Multidetector CT imaging of the head, cervical spine, and maxillofacial structures were performed using the standard protocol without intravenous contrast. Multiplanar CT image reconstructions of the cervical spine and maxillofacial structures were also generated. COMPARISON:  07/09/2019 FINDINGS: CT HEAD FINDINGS Brain: No evidence of acute infarction, hemorrhage, hydrocephalus, extra-axial collection or mass lesion/mass effect. Mild periventricular white matter hypodensity. Vascular: No hyperdense vessel or  unexpected calcification. CT FACIAL BONES FINDINGS Skull: Normal. Negative for fracture or focal lesion. Facial bones: No displaced fractures or dislocations of the facial bones proper. Sinuses/Orbits: There is a hyperdense fluid level within the left maxillary sinus. Small, mildly displaced blowout fracture fragment of the left orbital floor, fracture fragment measuring approximately 5 mm x 9 mm with 4 mm displacement. There is a small amount of fat herniation without involvement of the inferior rectus. Other: Soft tissue contusion and laceration of the left  cheek and eyelids. Right parietal scalp laceration. CT CERVICAL SPINE FINDINGS Alignment: Normal. Skull base and vertebrae: No acute fracture. No primary bone lesion or focal pathologic process. Soft tissues and spinal canal: No prevertebral fluid or swelling. No visible canal hematoma. Disc levels:  Intact. Upper chest: Negative. Other: None. IMPRESSION: 1.  No acute intracranial pathology. 2. Small, mildly displaced blowout fracture fragment of the left orbital floor, fracture fragment measuring approximately 5 mm x 9 mm with 4 mm displacement. There is a small amount of fat herniation without involvement of the inferior rectus. 3. Soft tissue contusion and laceration of the left cheek and eyelids. 4.  Right parietal scalp laceration. 5.  No fracture or static subluxation of the cervical spine. Electronically Signed   By: Eddie Candle M.D.   On: 12/22/2019 19:43   CT Cervical Spine Wo Contrast  Result Date: 12/22/2019 CLINICAL DATA:  Multiple falls, scalp laceration, Eliquis EXAM: CT HEAD WITHOUT CONTRAST CT MAXILLOFACIAL WITHOUT CONTRAST CT CERVICAL SPINE WITHOUT CONTRAST TECHNIQUE: Multidetector CT imaging of the head, cervical spine, and maxillofacial structures were performed using the standard protocol without intravenous contrast. Multiplanar CT image reconstructions of the cervical spine and maxillofacial structures were also generated. COMPARISON:   07/09/2019 FINDINGS: CT HEAD FINDINGS Brain: No evidence of acute infarction, hemorrhage, hydrocephalus, extra-axial collection or mass lesion/mass effect. Mild periventricular white matter hypodensity. Vascular: No hyperdense vessel or unexpected calcification. CT FACIAL BONES FINDINGS Skull: Normal. Negative for fracture or focal lesion. Facial bones: No displaced fractures or dislocations of the facial bones proper. Sinuses/Orbits: There is a hyperdense fluid level within the left maxillary sinus. Small, mildly displaced blowout fracture fragment of the left orbital floor, fracture fragment measuring approximately 5 mm x 9 mm with 4 mm displacement. There is a small amount of fat herniation without involvement of the inferior rectus. Other: Soft tissue contusion and laceration of the left cheek and eyelids. Right parietal scalp laceration. CT CERVICAL SPINE FINDINGS Alignment: Normal. Skull base and vertebrae: No acute fracture. No primary bone lesion or focal pathologic process. Soft tissues and spinal canal: No prevertebral fluid or swelling. No visible canal hematoma. Disc levels:  Intact. Upper chest: Negative. Other: None. IMPRESSION: 1.  No acute intracranial pathology. 2. Small, mildly displaced blowout fracture fragment of the left orbital floor, fracture fragment measuring approximately 5 mm x 9 mm with 4 mm displacement. There is a small amount of fat herniation without involvement of the inferior rectus. 3. Soft tissue contusion and laceration of the left cheek and eyelids. 4.  Right parietal scalp laceration. 5.  No fracture or static subluxation of the cervical spine. Electronically Signed   By: Eddie Candle M.D.   On: 12/22/2019 19:43   DG Chest Port 1 View  Result Date: 12/22/2019 CLINICAL DATA:  Fall EXAM: PORTABLE CHEST 1 VIEW COMPARISON:  07/09/2019 FINDINGS: The heart size and mediastinal contours are within normal limits. Aortic atherosclerosis. Both lungs are clear. The visualized skeletal  structures are unremarkable. IMPRESSION: No active disease. Electronically Signed   By: Donavan Foil M.D.   On: 12/22/2019 18:40   CT Maxillofacial WO CM  Result Date: 12/22/2019 CLINICAL DATA:  Multiple falls, scalp laceration, Eliquis EXAM: CT HEAD WITHOUT CONTRAST CT MAXILLOFACIAL WITHOUT CONTRAST CT CERVICAL SPINE WITHOUT CONTRAST TECHNIQUE: Multidetector CT imaging of the head, cervical spine, and maxillofacial structures were performed using the standard protocol without intravenous contrast. Multiplanar CT image reconstructions of the cervical spine and maxillofacial structures were also generated. COMPARISON:  07/09/2019  FINDINGS: CT HEAD FINDINGS Brain: No evidence of acute infarction, hemorrhage, hydrocephalus, extra-axial collection or mass lesion/mass effect. Mild periventricular white matter hypodensity. Vascular: No hyperdense vessel or unexpected calcification. CT FACIAL BONES FINDINGS Skull: Normal. Negative for fracture or focal lesion. Facial bones: No displaced fractures or dislocations of the facial bones proper. Sinuses/Orbits: There is a hyperdense fluid level within the left maxillary sinus. Small, mildly displaced blowout fracture fragment of the left orbital floor, fracture fragment measuring approximately 5 mm x 9 mm with 4 mm displacement. There is a small amount of fat herniation without involvement of the inferior rectus. Other: Soft tissue contusion and laceration of the left cheek and eyelids. Right parietal scalp laceration. CT CERVICAL SPINE FINDINGS Alignment: Normal. Skull base and vertebrae: No acute fracture. No primary bone lesion or focal pathologic process. Soft tissues and spinal canal: No prevertebral fluid or swelling. No visible canal hematoma. Disc levels:  Intact. Upper chest: Negative. Other: None. IMPRESSION: 1.  No acute intracranial pathology. 2. Small, mildly displaced blowout fracture fragment of the left orbital floor, fracture fragment measuring approximately  5 mm x 9 mm with 4 mm displacement. There is a small amount of fat herniation without involvement of the inferior rectus. 3. Soft tissue contusion and laceration of the left cheek and eyelids. 4.  Right parietal scalp laceration. 5.  No fracture or static subluxation of the cervical spine. Electronically Signed   By: Eddie Candle M.D.   On: 12/22/2019 19:43    Labs: BNP (last 3 results) Recent Labs    06/28/19 1830 07/09/19 0241  BNP 168.7* [id number]*   Basic Metabolic Panel: Recent Labs  Lab 12/23/19 0425 12/24/19 0406 12/25/19 0356 12/26/19 0313 12/27/19 0347  NA 133* 133* 134* 135 132*  K 4.6 3.8 4.3 3.8 3.6  CL 102 98 101 100 96*  CO2 22 24 25 24 24   GLUCOSE 103* 128* 141* 141* 133*  BUN 22* 27* 27* 26* 26*  CREATININE 1.31* 2.03* 2.00* 1.96* 1.68*  CALCIUM 8.1* 9.6 9.1 9.5 9.2   Liver Function Tests: Recent Labs  Lab 12/22/19 1806  AST 43*  ALT 63*  ALKPHOS 50  BILITOT 0.5  PROT 6.6  ALBUMIN 4.1   No results for input(s): LIPASE, AMYLASE in the last 168 hours. No results for input(s): AMMONIA in the last 168 hours. CBC: Recent Labs  Lab 12/22/19 1806 12/22/19 1806 12/23/19 0425 12/24/19 0406 12/25/19 0356 12/26/19 0313 12/27/19 0347  WBC 8.0   < > 5.8 6.9 5.8 6.1 6.8  NEUTROABS 6.1  --   --   --   --   --   --   HGB 10.2*   < > 8.1* 9.0* 8.1* 7.9* 8.1*  HCT 32.8*   < > 25.9* 27.9* 25.4* 25.3* 25.9*  MCV 102.5*   < > 103.2* 99.6 102.0* 102.0* 101.2*  PLT 402*   < > 301 376 361 362 374   < > = values in this interval not displayed.   Cardiac Enzymes: No results for input(s): CKTOTAL, CKMB, CKMBINDEX, TROPONINI in the last 168 hours. BNP: Invalid input(s): POCBNP CBG: No results for input(s): GLUCAP in the last 168 hours. D-Dimer No results for input(s): DDIMER in the last 72 hours. Hgb A1c No results for input(s): HGBA1C in the last 72 hours. Lipid Profile No results for input(s): CHOL, HDL, LDLCALC, TRIG, CHOLHDL, LDLDIRECT in the last 72  hours. Thyroid function studies No results for input(s): TSH, T4TOTAL, T3FREE, THYROIDAB in the last  72 hours.  Invalid input(s): FREET3 Anemia work up No results for input(s): VITAMINB12, FOLATE, FERRITIN, TIBC, IRON, RETICCTPCT in the last 72 hours. Urinalysis    Component Value Date/Time   COLORURINE YELLOW 12/22/2019 2201   APPEARANCEUR CLEAR 12/22/2019 2201   LABSPEC 1.011 12/22/2019 2201   PHURINE 6.0 12/22/2019 2201   GLUCOSEU NEGATIVE 12/22/2019 2201   HGBUR NEGATIVE 12/22/2019 2201   BILIRUBINUR NEGATIVE 12/22/2019 2201   KETONESUR NEGATIVE 12/22/2019 2201   PROTEINUR NEGATIVE 12/22/2019 2201   NITRITE NEGATIVE 12/22/2019 2201   LEUKOCYTESUR NEGATIVE 12/22/2019 2201   Sepsis Labs Invalid input(s): PROCALCITONIN,  WBC,  LACTICIDVEN Microbiology Recent Results (from the past 240 hour(s))  Respiratory Panel by RT PCR (Flu A&B, Covid) - Nasopharyngeal Swab     Status: None   Collection Time: 12/22/19  6:22 PM   Specimen: Nasopharyngeal Swab  Result Value Ref Range Status   SARS Coronavirus 2 by RT PCR NEGATIVE NEGATIVE Final    Comment: (NOTE) SARS-CoV-2 target nucleic acids are NOT DETECTED. The SARS-CoV-2 RNA is generally detectable in upper respiratoy specimens during the acute phase of infection. The lowest concentration of SARS-CoV-2 viral copies this assay can detect is 131 copies/mL. A negative result does not preclude SARS-Cov-2 infection and should not be used as the sole basis for treatment or other patient management decisions. A negative result may occur with  improper specimen collection/handling, submission of specimen other than nasopharyngeal swab, presence of viral mutation(s) within the areas targeted by this assay, and inadequate number of viral copies (<131 copies/mL). A negative result must be combined with clinical observations, patient history, and epidemiological information. The expected result is Negative. Fact Sheet for Patients:   PinkCheek.be Fact Sheet for Healthcare Providers:  GravelBags.it This test is not yet ap proved or cleared by the [place] FDA and  has been authorized for detection and/or diagnosis of SARS-CoV-2 by FDA under an Emergency Use Authorization (EUA). This EUA will remain  in effect (meaning this test can be used) for the duration of the COVID-19 declaration under Section 564(b)(1) of the Act, 21 U.S.C. section 360bbb-3(b)(1), unless the authorization is terminated or revoked sooner.    Influenza A by PCR NEGATIVE NEGATIVE Final   Influenza B by PCR NEGATIVE NEGATIVE Final    Comment: (NOTE) The Xpert Xpress SARS-CoV-2/FLU/RSV assay is intended as an aid in  the diagnosis of influenza from Nasopharyngeal swab specimens and  should not be used as a sole basis for treatment. Nasal washings and  aspirates are unacceptable for Xpert Xpress SARS-CoV-2/FLU/RSV  testing. Fact Sheet for Patients: PinkCheek.be Fact Sheet for Healthcare Providers: GravelBags.it This test is not yet approved or cleared by the [place] FDA and  has been authorized for detection and/or diagnosis of SARS-CoV-2 by  FDA under an Emergency Use Authorization (EUA). This EUA will remain  in effect (meaning this test can be used) for the duration of the  Covid-19 declaration under Section 564(b)(1) of the Act, 21  U.S.C. section 360bbb-3(b)(1), unless the authorization is  terminated or revoked. Performed at [facility], [place] [address] [place], [place] [address]   SARS Coronavirus 2 by RT PCR (hospital order, performed in [facility] hospital lab) Nasopharyngeal Nasopharyngeal Swab     Status: None   Collection Time: 12/28/19  1:35 PM   Specimen: Nasopharyngeal Swab  Result Value Ref Range Status   SARS Coronavirus 2 NEGATIVE NEGATIVE Final    Comment: (NOTE) SARS-CoV-2 target nucleic  acids are NOT  DETECTED. The SARS-CoV-2 RNA is generally detectable in upper and lower respiratory specimens during the acute phase of infection. The lowest concentration of SARS-CoV-2 viral copies this assay can detect is 250 copies / mL. A negative result does not preclude SARS-CoV-2 infection and should not be used as the sole basis for treatment or other patient management decisions.  A negative result may occur with improper specimen collection / handling, submission of specimen other than nasopharyngeal swab, presence of viral mutation(s) within the areas targeted by this assay, and inadequate number of viral copies (<250 copies / mL). A negative result must be combined with clinical observations, patient history, and epidemiological information. Fact Sheet for Patients:   StrictlyIdeas.no Fact Sheet for Healthcare Providers: BankingDealers.co.za This test is not yet approved or cleared  by the [place] FDA and has been authorized for detection and/or diagnosis of SARS-CoV-2 by FDA under an Emergency Use Authorization (EUA).  This EUA will remain in effect (meaning this test can be used) for the duration of the COVID-19 declaration under Section 564(b)(1) of the Act, 21 U.S.C. section 360bbb-3(b)(1), unless the authorization is terminated or revoked sooner. Performed at [facility], [place] [address], [place], [place] [address]       Time coordinating discharge: 35 minutes  SIGNED: Antonieta Pert, MD  [facility] 12/29/2019, 11:08 AM  If 7PM-7AM, please contact night-coverage [email or web address]

## 2019-12-29 NOTE — Progress Notes (Signed)
Physical Therapy Treatment Patient Details Name: Brianna Porter MRN: [id number] DOB: [birth year] Today's Date: 12/29/2019    History of Present Illness 59 y.o. female with medical history significant of systolic heart failure, CKD, A. fib/flutter on apixaban, hyperthyroidism, gastric ulcers, and CAD who presents after 2 ground-level falls within 24 hours. Found to have L tibial avulsion fx and L orbital floor fx.     PT Comments    Pt seated edge of bed on arrival.  She required assistance and encouragement to participate in PT session today.  Pt is mildly unsteady with gt and presents with poor control of device .  She would benefit from a skilled rehab placement for PT to improve strength , function and balance before returning home.  Session limited due gt due to increasing pain in her head.  Cold pack applied to head.    Follow Up Recommendations  SNF;Supervision/Assistance - 24 hour     Equipment Recommendations  Wheelchair (measurements PT)    Recommendations for Other Services       Precautions / Restrictions Precautions Precautions: Fall Required Braces or Orthoses: Knee Immobilizer - Left(for comfort only did not require this session.) Restrictions Weight Bearing Restrictions: Yes LLE Weight Bearing: Weight bearing as tolerated Other Position/Activity Restrictions: WBAT on LLE per MD    Mobility  Bed Mobility Overal bed mobility: Needs Assistance Bed Mobility: Sit to Supine     Supine to sit: (seated on edge of bed on arrival.) Sit to supine: Min assist   General bed mobility comments: Assistance to lift B LEs back to bed against gravity.  Transfers Overall transfer level: Needs assistance Equipment used: Rolling walker (2 wheeled) Transfers: Sit to/from Stand Sit to Stand: Min guard         General transfer comment: Cues for hand placement with poor safety noted with following commands for hand placement.  Ambulation/Gait Ambulation/Gait assistance: Min  guard;Min assist Gait Distance (Feet): 100 Feet Assistive device: Rolling walker (2 wheeled) Gait Pattern/deviations: Step-through pattern;Antalgic;Decreased stride length;Decreased stance time - left;Trendelenburg     General Gait Details: Cues for walker proximity, min guard for balance overall with min assistance x1 due to poor obstacle negotiation and stepping outside of the RW.  Noted decreased bilateral foot clearance (particularly with fatigue) and antalgic gait pattern.  Pt noted to veer to L but reports the walker is "Pulling."   Stairs             Wheelchair Mobility    Modified Rankin (Stroke Patients Only)       Balance Overall balance assessment: Needs assistance Sitting-balance support: No upper extremity supported Sitting balance-Leahy Scale: Good     Standing balance support: Bilateral upper extremity supported;During functional activity Standing balance-Leahy Scale: Fair                              Cognition Arousal/Alertness: Awake/alert Behavior During Therapy: WFL for tasks assessed/performed Overall Cognitive Status: No family/caregiver present to determine baseline cognitive functioning                                 General Comments: Decreased safety awareness with poor insight to deficits.       Exercises      General Comments        Pertinent Vitals/Pain Pain Assessment: Faces Faces Pain Scale: Hurts even more Pain Location: headache Pain Descriptors /  Indicators: Headache Pain Intervention(s): Monitored during session;Repositioned    Home Living                      Prior Function            PT Goals (current goals can now be found in the care plan section) Acute Rehab PT Goals Patient Stated Goal: To go to rehab and get stronger PT Goal Formulation: With patient Potential to Achieve Goals: Good Progress towards PT goals: Progressing toward goals    Frequency    Min 3X/week       PT Plan Current plan remains appropriate    Co-evaluation              AM-PAC PT "6 Clicks" Mobility   Outcome Measure  Help needed turning from your back to your side while in a flat bed without using bedrails?: None Help needed moving from lying on your back to sitting on the side of a flat bed without using bedrails?: A Little Help needed moving to and from a bed to a chair (including a wheelchair)?: A Little Help needed standing up from a chair using your arms (e.g., wheelchair or bedside chair)?: A Little Help needed to walk in hospital room?: A Little Help needed climbing 3-5 steps with a railing? : A Little 6 Click Score: 19    End of Session Equipment Utilized During Treatment: Gait belt Activity Tolerance: Patient tolerated treatment well Patient left: with call bell/phone within reach;in bed;with bed alarm set Nurse Communication: Mobility status PT Visit Diagnosis: History of falling (Z91.81);Repeated falls (R29.6);Muscle weakness (generalized) (M62.81);Unsteadiness on feet (R26.81)     Time: [id number] PT Time Calculation (min) (ACUTE ONLY): 17 min  Charges:  $Gait Training: 8-22 mins                     Erasmo Leventhal , PTA Acute Rehabilitation Services Pager [phone] Office [place] 12/29/2019, 1:22 PM

## 2019-12-29 NOTE — Consult Note (Signed)
   [facility] CM Inpatient Consult   12/29/2019  Brianna Porter 1961-07-21 [id number]  [facility] ACO patient: [employer]  Patient screened for high risk score for unplanned readmission with 3 hospitalizations in [facility] system in the past 6 months noted.  Chart reviewed to check if potential [facility] services are needed.   Review of patient's medical record which includes but not limited to and  reveals patient is admitted with falls resulting in Orbital fracture and Tibial tubercle fracture per MD discharge summary.  Patient is being recommended for a skilled nursing facility level of care.   Plan: No [facility] Care Management follow up is planned if patient goes to a SNF as her care needs would be met at that level of care. However, will continue to follow progress and disposition to assess for post hospital care management needs for changes.    Please place a [facility] Care Management consult as appropriate and for questions contact:   Natividad Brood, RN BSN [facility]  [phone] business mobile phone Toll free office [phone]  Fax number: [phone] [email or web address]

## 2019-12-29 NOTE — Plan of Care (Signed)
  Problem: Pain Managment: Goal: General experience of comfort will improve Outcome: Progressing   Problem: Safety: Goal: Ability to remain free from injury will improve Outcome: Progressing   Problem: Skin Integrity: Goal: Risk for impaired skin integrity will decrease Outcome: Progressing   

## 2019-12-30 DIAGNOSIS — S82122D Displaced fracture of lateral condyle of left tibia, subsequent encounter for closed fracture with routine healing: Secondary | ICD-10-CM | POA: Diagnosis not present

## 2019-12-30 DIAGNOSIS — E0580 Other thyrotoxicosis without thyrotoxic crisis or storm: Secondary | ICD-10-CM | POA: Diagnosis not present

## 2019-12-30 DIAGNOSIS — S0232XD Fracture of orbital floor, left side, subsequent encounter for fracture with routine healing: Secondary | ICD-10-CM | POA: Diagnosis not present

## 2019-12-30 DIAGNOSIS — Z7401 Bed confinement status: Secondary | ICD-10-CM | POA: Diagnosis not present

## 2019-12-30 DIAGNOSIS — R0602 Shortness of breath: Secondary | ICD-10-CM | POA: Diagnosis not present

## 2019-12-30 DIAGNOSIS — I5022 Chronic systolic (congestive) heart failure: Secondary | ICD-10-CM | POA: Diagnosis not present

## 2019-12-30 DIAGNOSIS — E1122 Type 2 diabetes mellitus with diabetic chronic kidney disease: Secondary | ICD-10-CM | POA: Diagnosis not present

## 2019-12-30 DIAGNOSIS — M2550 Pain in unspecified joint: Secondary | ICD-10-CM | POA: Diagnosis not present

## 2019-12-30 DIAGNOSIS — E039 Hypothyroidism, unspecified: Secondary | ICD-10-CM | POA: Diagnosis not present

## 2019-12-30 DIAGNOSIS — D649 Anemia, unspecified: Secondary | ICD-10-CM | POA: Diagnosis not present

## 2019-12-30 DIAGNOSIS — N1831 Chronic kidney disease, stage 3a: Secondary | ICD-10-CM | POA: Diagnosis not present

## 2019-12-30 DIAGNOSIS — I482 Chronic atrial fibrillation, unspecified: Secondary | ICD-10-CM | POA: Diagnosis not present

## 2019-12-30 DIAGNOSIS — W19XXXA Unspecified fall, initial encounter: Secondary | ICD-10-CM | POA: Diagnosis not present

## 2019-12-30 DIAGNOSIS — R7989 Other specified abnormal findings of blood chemistry: Secondary | ICD-10-CM | POA: Diagnosis not present

## 2019-12-30 NOTE — Progress Notes (Signed)
PT TREATMENT NOTE  Pt progressing steadily towards her physical therapy goals. Ambulating 100 ft x 2 with a walker and up to min assist for balance. Rest of session focused on serial sit to stands for functional strengthening. Continues to present as a high fall risk based on decreased gait speed and history of recurrent falls. Continue to recommend SNF for ongoing Physical Therapy.      Brianna Porter, PT, DPT Acute Rehabilitation Services Pager [phone] Office [phone]    12/30/19 1251  PT Visit Information  Last PT Received On 12/30/19  Assistance Needed +1  History of Present Illness 59 y.o. female with medical history significant of systolic heart failure, CKD, A. fib/flutter on apixaban, hyperthyroidism, gastric ulcers, and CAD who presents after 2 ground-level falls within 24 hours. Found to have L tibial avulsion fx and L orbital floor fx.   Subjective Data  Patient Stated Goal To go to rehab and get stronger  Precautions  Precautions Fall  Required Braces or Orthoses Knee Immobilizer - Left  Restrictions  Weight Bearing Restrictions Yes  LLE Weight Bearing WBAT  Pain Assessment  Pain Assessment Faces  Faces Pain Scale 2  Pain Location headache  Pain Descriptors / Indicators Headache  Pain Intervention(s) Monitored during session  Cognition  Arousal/Alertness Awake/alert  Behavior During Therapy WFL for tasks assessed/performed  Overall Cognitive Status Within Functional Limits for tasks assessed  Bed Mobility  Overal bed mobility Modified Independent  Transfers  Overall transfer level Needs assistance  Equipment used Rolling walker (2 wheeled)  Transfers Sit to/from Stand  Sit to Stand Min guard  Ambulation/Gait  Ambulation/Gait assistance Min guard;Min assist  Gait Distance (Feet) 100 Feet (100", 100")  Assistive device Rolling walker (2 wheeled)  Gait Pattern/deviations Step-through pattern;Antalgic;Decreased stride length;Decreased stance time -  left;Trendelenburg;Drifts right/left  General Gait Details Cues for increased left foot clearance, pt tends to drag feet with fatigue. Also tendency to drift towards left, pt stating, "why does this walker pull me to the left?" Requiring one standing rest break. Mostly min guard for safety, but required minA for stability during turning.  Gait velocity decreased  Balance  Overall balance assessment Needs assistance  Sitting-balance support No upper extremity supported  Sitting balance-Leahy Scale Good  Standing balance support During functional activity;Single extremity supported  Standing balance-Leahy Scale Fair  Exercises  Exercises Other exercises  Other Exercises  Other Exercises Serial sit to stands x 5  PT - End of Session  Equipment Utilized During Treatment Gait belt  Activity Tolerance Patient tolerated treatment well  Patient left in bed;with call bell/phone within reach;with bed alarm set  Nurse Communication Mobility status   PT - Assessment/Plan  PT Plan Current plan remains appropriate  PT Visit Diagnosis History of falling (Z91.81);Repeated falls (R29.6);Muscle weakness (generalized) (M62.81);Unsteadiness on feet (R26.81)  PT Frequency (ACUTE ONLY) Min 3X/week  Follow Up Recommendations SNF;Supervision/Assistance - 24 hour  PT equipment Wheelchair (measurements PT)  AM-PAC PT "6 Clicks" Mobility Outcome Measure (Version 2)  Help needed turning from your back to your side while in a flat bed without using bedrails? 4  Help needed moving from lying on your back to sitting on the side of a flat bed without using bedrails? 4  Help needed moving to and from a bed to a chair (including a wheelchair)? 3  Help needed standing up from a chair using your arms (e.g., wheelchair or bedside chair)? 3  Help needed to walk in hospital room? 3  Help needed climbing  3-5 steps with a railing?  3  6 Click Score 20  Consider Recommendation of Discharge To: Home with no services  PT Goal  Progression  Progress towards PT goals Progressing toward goals  Acute Rehab PT Goals  Potential to Achieve Goals Good  PT Time Calculation  PT Start Time (ACUTE ONLY) 1032  PT Stop Time (ACUTE ONLY) 1049  PT Time Calculation (min) (ACUTE ONLY) 17 min  PT General Charges  $$ ACUTE PT VISIT 1 Visit  PT Treatments  $Gait Training 8-22 mins

## 2019-12-30 NOTE — Progress Notes (Signed)
Pt did not want dressing changed at this time

## 2019-12-30 NOTE — Discharge Summary (Signed)
Admission date-12/22/2019 Discharge date 12/30/2019 Discharge disposition-skilled nursing facility.  Patient today's insurance authorization to be discharged to skilled nursing facility on 12/30/2019.  Patient is currently medically stable.  Please refer to full discharge summary done by Dr. Antonieta Pert on 12/29/2019.  Vital signs remained stable.  Medically optimized to be transferred with recommendations as stated from discharge summary on 12/29/2019.  Please call me with further questions as necessary.  Gerlean Ren MD [facility]

## 2019-12-30 NOTE — Progress Notes (Signed)
Report given to BrittanyLPN of [facility]. All questions were answered.

## 2019-12-30 NOTE — Progress Notes (Signed)
Pt is about to be transported to [email or web address]via [facility]  In stretcher,. All necessary documents given to [facility] staff. Pt alert/oriented in no acute distress.No complaints.

## 2019-12-30 NOTE — Plan of Care (Signed)
  Problem: Education: Goal: Knowledge of General Education information will improve Description Including pain rating scale, medication(s)/side effects and non-pharmacologic comfort measures Outcome: Adequate for Discharge   Problem: Health Behavior/Discharge Planning: Goal: Ability to manage health-related needs will improve Outcome: Adequate for Discharge   Problem: Clinical Measurements: Goal: Ability to maintain clinical measurements within normal limits will improve Outcome: Adequate for Discharge   Problem: Activity: Goal: Risk for activity intolerance will decrease Outcome: Adequate for Discharge   Problem: Nutrition: Goal: Adequate nutrition will be maintained Outcome: Adequate for Discharge   Problem: Coping: Goal: Level of anxiety will decrease Outcome: Adequate for Discharge   Problem: Elimination: Goal: Will not experience complications related to bowel motility Outcome: Adequate for Discharge   Problem: Pain Managment: Goal: General experience of comfort will improve Outcome: Adequate for Discharge   Problem: Safety: Goal: Ability to remain free from injury will improve Outcome: Adequate for Discharge   Problem: Skin Integrity: Goal: Risk for impaired skin integrity will decrease Outcome: Adequate for Discharge   

## 2019-12-30 NOTE — Progress Notes (Signed)
Occupational Therapy Treatment Patient Details Name: Brianna Porter MRN: [id number] DOB: March 13, 1961 Today's Date: 12/30/2019    History of present illness 59 y.o. female with medical history significant of systolic heart failure, CKD, A. fib/flutter on apixaban, hyperthyroidism, gastric ulcers, and CAD who presents after 2 ground-level falls within 24 hours. Found to have L tibial avulsion fx and L orbital floor fx.    OT comments  Pt progressing toward established goals. Pt currently requires minA for grooming completion at sink level with single upper extremity supported. Pt required minguard for sit<>stand transfers this session. Pt demonstrates increased awareness to deficits this session. Pt will continue to benefit from skilled OT services to maximize safety and independence with ADL/IADL and functional mobility. Will continue to follow acutely and progress as tolerated.      Follow Up Recommendations  SNF;Supervision/Assistance - 24 hour    Equipment Recommendations  None recommended by OT    Recommendations for Other Services      Precautions / Restrictions Precautions Precautions: Fall Required Braces or Orthoses: Knee Immobilizer - Left(for comfort only) Restrictions Weight Bearing Restrictions: Yes LLE Weight Bearing: Weight bearing as tolerated Other Position/Activity Restrictions: WBAT on LLE per MD       Mobility Bed Mobility Overal bed mobility: Needs Assistance Bed Mobility: Sit to Supine     Supine to sit: Min guard     General bed mobility comments: minguard for safety  Transfers Overall transfer level: Needs assistance Equipment used: Rolling walker (2 wheeled) Transfers: Sit to/from Stand Sit to Stand: Min guard         General transfer comment: cues for safe hand placement    Balance Overall balance assessment: Needs assistance Sitting-balance support: No upper extremity supported Sitting balance-Leahy Scale: Good     Standing balance  support: During functional activity;Single extremity supported Standing balance-Leahy Scale: Fair Standing balance comment: single UE support standing at sink level                           ADL either performed or assessed with clinical judgement   ADL Overall ADL's : Needs assistance/impaired     Grooming: Standing;Min guard Grooming Details (indicate cue type and reason): completed at sink level Upper Body Bathing: Min guard;Sitting               Toilet Transfer: Minimal assistance;Ambulation;RW   Toileting- Clothing Manipulation and Hygiene: Minimal assistance;Sit to/from stand       Functional mobility during ADLs: Minimal assistance;Rolling walker General ADL Comments: pt continues to demonstrate balance limitations and posterior lean intermittently     Vision   Additional Comments: no visual concerns reported this session   Perception     Praxis      Cognition Arousal/Alertness: Awake/alert Behavior During Therapy: WFL for tasks assessed/performed Overall Cognitive Status: Within Functional Limits for tasks assessed                                 General Comments: increased insight to deficits        Exercises     Shoulder Instructions       General Comments      Pertinent Vitals/ Pain       Pain Assessment: 0-10 Pain Score: 8  Pain Location: headache Pain Descriptors / Indicators: Headache Pain Intervention(s): Monitored during session  Home Living  Prior Functioning/Environment              Frequency  Min 2X/week        Progress Toward Goals  OT Goals(current goals can now be found in the care plan section)  Progress towards OT goals: Progressing toward goals  Acute Rehab OT Goals Patient Stated Goal: To go to rehab and get stronger OT Goal Formulation: With patient Time For Goal Achievement: 01/06/20 Potential to Achieve Goals: Good ADL  Goals Pt Will Perform Lower Body Bathing: with supervision;sit to/from stand Pt Will Transfer to Toilet: with modified independence;ambulating;bedside commode Pt Will Perform Toileting - Clothing Manipulation and hygiene: with modified independence;sit to/from stand Additional ADL Goal #1: Pt will independently verbalize 3 strategies to reduce risks of falls  Plan Discharge plan remains appropriate    Co-evaluation                 AM-PAC OT "6 Clicks" Daily Activity     Outcome Measure   Help from another person eating meals?: A Little Help from another person taking care of personal grooming?: A Little Help from another person toileting, which includes using toliet, bedpan, or urinal?: A Little Help from another person bathing (including washing, rinsing, drying)?: A Little Help from another person to put on and taking off regular upper body clothing?: A Little   6 Click Score: 15    End of Session Equipment Utilized During Treatment: Gait belt;Rolling walker  OT Visit Diagnosis: Unsteadiness on feet (R26.81);Other abnormalities of gait and mobility (R26.89);Repeated falls (R29.6);Muscle weakness (generalized) (M62.81);History of falling (Z91.81);Pain Pain - Right/Left: Left Pain - part of body: (head and face)   Activity Tolerance Patient tolerated treatment well   Patient Left in chair;with call bell/phone within reach;with chair alarm set   Nurse Communication Mobility status        Time: [id number] OT Time Calculation (min): 13 min  Charges: OT General Charges $OT Visit: 1 Visit OT Treatments $Self Care/Home Management : 8-22 mins  Helene Kelp OTR/L Acute Rehabilitation Services Office: [place] 12/30/2019, 12:49 PM

## 2020-01-05 DIAGNOSIS — R2681 Unsteadiness on feet: Secondary | ICD-10-CM | POA: Diagnosis not present

## 2020-01-05 DIAGNOSIS — Z6826 Body mass index (BMI) 26.0-26.9, adult: Secondary | ICD-10-CM | POA: Diagnosis not present

## 2020-01-05 DIAGNOSIS — R7989 Other specified abnormal findings of blood chemistry: Secondary | ICD-10-CM | POA: Diagnosis not present

## 2020-01-05 DIAGNOSIS — E7800 Pure hypercholesterolemia, unspecified: Secondary | ICD-10-CM | POA: Diagnosis not present

## 2020-01-05 DIAGNOSIS — I4892 Unspecified atrial flutter: Secondary | ICD-10-CM | POA: Diagnosis not present

## 2020-01-05 DIAGNOSIS — J309 Allergic rhinitis, unspecified: Secondary | ICD-10-CM | POA: Diagnosis not present

## 2020-01-05 DIAGNOSIS — I482 Chronic atrial fibrillation, unspecified: Secondary | ICD-10-CM | POA: Diagnosis not present

## 2020-01-05 DIAGNOSIS — E663 Overweight: Secondary | ICD-10-CM | POA: Diagnosis not present

## 2020-01-07 ENCOUNTER — Other Ambulatory Visit: Payer: Self-pay | Admitting: Cardiology

## 2020-01-07 DIAGNOSIS — I129 Hypertensive chronic kidney disease with stage 1 through stage 4 chronic kidney disease, or unspecified chronic kidney disease: Secondary | ICD-10-CM | POA: Diagnosis not present

## 2020-01-07 DIAGNOSIS — D631 Anemia in chronic kidney disease: Secondary | ICD-10-CM | POA: Diagnosis not present

## 2020-01-07 DIAGNOSIS — N1832 Chronic kidney disease, stage 3b: Secondary | ICD-10-CM | POA: Diagnosis not present

## 2020-01-07 DIAGNOSIS — N189 Chronic kidney disease, unspecified: Secondary | ICD-10-CM | POA: Diagnosis not present

## 2020-01-07 DIAGNOSIS — N2581 Secondary hyperparathyroidism of renal origin: Secondary | ICD-10-CM | POA: Diagnosis not present

## 2020-01-08 ENCOUNTER — Other Ambulatory Visit: Payer: Self-pay | Admitting: Internal Medicine

## 2020-01-08 DIAGNOSIS — N1832 Chronic kidney disease, stage 3b: Secondary | ICD-10-CM

## 2020-01-11 ENCOUNTER — Other Ambulatory Visit: Payer: Self-pay

## 2020-01-11 NOTE — Patient Outreach (Signed)
[facility]) Care Management  01/11/2020  Catoya Femrite 14-May-1961 [id number]    Reviewed new referral: Appears from reading referral that home health is needed.    I placed call to MD office to inquire if this referral was sent with the intention of home health.    PLAN: awaiting a call back.  Tomasa Rand, RN, BSN, CEN [facility] [employer] [phone]

## 2020-01-12 ENCOUNTER — Other Ambulatory Visit: Payer: Self-pay

## 2020-01-12 NOTE — Patient Outreach (Signed)
[facility]) Care Management  01/12/2020  Brianna Porter Nov 22, 1960 [id number]   Care Coordination:  Incoming call from MD office to state that referral sent in error and was to go to home health.   PLAN:case closure as error in sending referral.  Tomasa Rand, RN, BSN, CEN [place] Coordinator [phone]

## 2020-01-12 NOTE — Progress Notes (Deleted)
Cardiology Office Note:    Date:  01/12/2020   ID:  Brianna Porter, DOB 12/21/1960, MRN [id number]  PCP:  Cher Nakai, MD  Cardiologist:  Shirlee More, MD    Referring MD: Cher Nakai, MD    ASSESSMENT:    No diagnosis found. PLAN:    In order of problems listed above:  1. ***   Next appointment: ***   Medication Adjustments/Labs and Tests Ordered: Current medicines are reviewed at length with the patient today.  Concerns regarding medicines are outlined above.  No orders of the defined types were placed in this encounter.  No orders of the defined types were placed in this encounter.   No chief complaint on file.   History of Present Illness:    Brianna Porter is a 59 y.o. female with a hx of atrial fibrillation seen by me 04/23/2019.  At the time of cardioversion September 2020 was found to have cardiomyopathy and heart failure.  She initially converted to sinus rhythm had recurrent atrial fibrillation was initiated on amiodarone and resume sinus rhythm soon afterwards. Her TEE [facility] independently reviewed 04/29/2019 with severe LV dysfunction ejection fraction estimated 20 to 25% severe right ventricular dysfunction no evidence of thrombus in the left atrium or left atrial appendage. Cardiac CTA independently reviewed 05/06/2019 calcium score 56 90th percentile for age and sex and minimal nonobstructive coronary artery disease less than 25%.  She was last seen 10/16/2019. Compliance with diet, lifestyle and medications: *** Past Medical History:  Diagnosis Date  . Acute gastric ulcer with hemorrhage   . Acute systolic heart failure ([place]) 05/07/2019  . AKI (acute kidney injury) ([place]) 05/07/2019  . Altered mental status 05/07/2019  . Anemia 04/23/2019  . Anxiety and depression   . Atrial fibrillation with rapid ventricular response ([place]) 04/29/2019  . Atrial flutter ([place])   . CAD (coronary artery disease)   . Chronic systolic CHF (congestive heart failure)  (HCC)    a. dx in setting of atrial fib/flutter - possibly tachy mediated. Coronary CTA with only mild CAD in 04/2019.  Marland Kitchen CKD (chronic kidney disease) stage 3, GFR 30-59 ml/min 04/23/2019  . CKD (chronic kidney disease), stage II   . Confusion    a. persistent confusion during 04/2019 admission of unclear cause. Home meds adjusted. CT/MRI brain nonacute.  . Depression 03/02/2019  . Diabetes ([place])   . Edema   . Elevated liver function tests   . Essential hypertension   . Fall 12/23/2019  . Gastrointestinal hemorrhage   . Hypercholesteremia   . Hyperkalemia   . Hyperlipidemia 04/23/2019  . Hypertension   . Hypertensive heart and chronic kidney disease with systolic congestive heart failure ([place])   . Hypertensive heart disease 03/02/2019  . Hyperthyroidism   . Hypokalemia   . Hypokalemia   . Hypomagnesemia   . Hyponatremia   . Iron deficiency anemia   . Mild CAD    a. Coronary CT 04/2019 - Minimal, Non-obstructive CAD.  Marland Kitchen NICM (nonischemic cardiomyopathy) ([place])   . Osteoarthritis 03/02/2019  . Persistent atrial fibrillation ([place])   . Pressure injury of skin 07/09/2019  . Prolonged Q-T interval on ECG 04/30/2019  . Prolonged QT interval   . Symptomatic anemia 06/28/2019  . Type 2 diabetes mellitus with complication, without long-term current use of insulin ([place]) 03/02/2019    Past Surgical History:  Procedure Laterality Date  . BIOPSY  06/29/2019   Procedure: BIOPSY;  Surgeon: Irene Shipper, MD;  Location[facility] ENDOSCOPY;  Service: Endoscopy;;  . CARDIOVERSION N/A 04/29/2019   Procedure: CARDIOVERSION;  Surgeon: Sanda Klein, MD;  Location: [place] ENDOSCOPY;  Service: Cardiovascular;  Laterality: N/A;  . CARDIOVERSION N/A 05/04/2019   Procedure: CARDIOVERSION;  Surgeon: Josue Hector, MD;  Location: [place];  Service: Cardiovascular;  Laterality: N/A;  . COLONOSCOPY WITH PROPOFOL N/A 07/12/2019   Procedure: COLONOSCOPY WITH PROPOFOL;  Surgeon: Doran Stabler, MD;  Location: [place];  Service: Gastroenterology;  Laterality: N/A;  . ENTEROSCOPY N/A 07/10/2019   Procedure: ENTEROSCOPY;  Surgeon: Doran Stabler, MD;  Location: [place];  Service: Gastroenterology;  Laterality: N/A;  . ESOPHAGOGASTRODUODENOSCOPY  06/29/2019  . ESOPHAGOGASTRODUODENOSCOPY (EGD) WITH PROPOFOL N/A 06/29/2019   Procedure: ESOPHAGOGASTRODUODENOSCOPY (EGD) WITH PROPOFOL;  Surgeon: Irene Shipper, MD;  Location: [facility] ENDOSCOPY;  Service: Endoscopy;  Laterality: N/A;  . GIVENS CAPSULE STUDY N/A 07/12/2019   Procedure: GIVENS CAPSULE STUDY;  Surgeon: Doran Stabler, MD;  Location: [place];  Service: Gastroenterology;  Laterality: N/A;  . HEMOSTASIS CLIP PLACEMENT  07/12/2019   Procedure: HEMOSTASIS CLIP PLACEMENT;  Surgeon: Doran Stabler, MD;  Location: [place];  Service: Gastroenterology;;  . POLYPECTOMY  07/12/2019   Procedure: POLYPECTOMY;  Surgeon: Doran Stabler, MD;  Location: [place];  Service: Gastroenterology;;  . SMALL BOWEL ENTEROSCOPY  07/10/2019  . SUBMUCOSAL TATTOO INJECTION  07/10/2019   Procedure: SUBMUCOSAL TATTOO INJECTION;  Surgeon: Doran Stabler, MD;  Location: [facility] ENDOSCOPY;  Service: Gastroenterology;;  . TEE WITHOUT CARDIOVERSION  04/29/2019  . TEE WITHOUT CARDIOVERSION N/A 04/29/2019   Procedure: TRANSESOPHAGEAL ECHOCARDIOGRAM (TEE);  Surgeon: Sanda Klein, MD;  Location: [facility] ENDOSCOPY;  Service: Cardiovascular;  Laterality: N/A;  . TUBAL LIGATION      Current Medications: No outpatient medications have been marked as taking for the 01/13/20 encounter (Appointment) with Richardo Priest, MD.     Allergies:   Ambien [zolpidem tartrate], Adhesive [tape], and Codeine   Social History   Socioeconomic History  . Marital status: Divorced    Spouse name: Not on file  . Number of children: Not on file  . Years of education: Not on file  . Highest education level: Not on file  Occupational History  . Not on file  Tobacco Use  .  Smoking status: Former Smoker    Packs/day: 2.00    Years: 30.00    Pack years: 60.00    Types: Cigarettes    Quit date: 2001    Years since quitting: 20.4  . Smokeless tobacco: Never Used  Substance and Sexual Activity  . Alcohol use: Never  . Drug use: Never  . Sexual activity: Not Currently  Other Topics Concern  . Not on file  Social History Narrative  . Not on file   Social Determinants of Health   Financial Resource Strain:   . Difficulty of Paying Living Expenses:   Food Insecurity:   . Worried About Charity fundraiser in the Last Year:   . Arboriculturist in the Last Year:   Transportation Needs:   . Film/video editor (Medical):   Marland Kitchen Lack of Transportation (Non-Medical):   Physical Activity:   . Days of Exercise per Week:   . Minutes of Exercise per Session:   Stress:   . Feeling of Stress :   Social Connections:   . Frequency of Communication with Friends and Family:   . Frequency of Social Gatherings with Friends and Family:   .  Attends Religious Services:   . Active Member of Clubs or Organizations:   . Attends Archivist Meetings:   Marland Kitchen Marital Status:      Family History: The patient's ***family history includes Cancer in her mother; Heart disease in her father and mother; Hypercholesterolemia in her brother and mother; Osteoarthritis in her mother; Stroke in her mother. ROS:   Please see the history of present illness.    All other systems reviewed and are negative.  EKGs/Labs/Other Studies Reviewed:    The following studies were reviewed today:  EKG:  EKG ordered today and personally reviewed.  The ekg ordered today demonstrates ***  Recent Labs: 07/09/2019: B Natriuretic Peptide 144.1; Magnesium 1.9 10/16/2019: NT-Pro BNP 449 12/22/2019: ALT 63 12/23/2019: TSH 55.003 12/27/2019: BUN 26; Creatinine, Ser 1.68; Hemoglobin 8.1; Platelets 374; Potassium 3.6; Sodium 132  Recent Lipid Panel No results found for: CHOL, TRIG, HDL, CHOLHDL,  VLDL, LDLCALC, LDLDIRECT  Physical Exam:    VS:  LMP  (LMP Unknown) Comment: ?10 years ago?    Wt Readings from Last 3 Encounters:  12/25/19 151 lb 0.2 oz (68.5 kg)  10/16/19 145 lb (65.8 kg)  10/12/19 146 lb (66.2 kg)     GEN: *** Well nourished, well developed in no acute distress HEENT: Normal NECK: No JVD; No carotid bruits LYMPHATICS: No lymphadenopathy CARDIAC: ***RRR, no murmurs, rubs, gallops RESPIRATORY:  Clear to auscultation without rales, wheezing or rhonchi  ABDOMEN: Soft, non-tender, non-distended MUSCULOSKELETAL:  No edema; No deformity  SKIN: Warm and dry NEUROLOGIC:  Alert and oriented x 3 PSYCHIATRIC:  Normal affect    Signed, Shirlee More, MD  01/12/2020 1:02 PM    [place]

## 2020-01-13 ENCOUNTER — Ambulatory Visit: Payer: Medicare HMO | Admitting: Cardiology

## 2020-01-14 DIAGNOSIS — J309 Allergic rhinitis, unspecified: Secondary | ICD-10-CM | POA: Diagnosis not present

## 2020-01-14 DIAGNOSIS — I4892 Unspecified atrial flutter: Secondary | ICD-10-CM | POA: Diagnosis not present

## 2020-01-14 DIAGNOSIS — I482 Chronic atrial fibrillation, unspecified: Secondary | ICD-10-CM | POA: Diagnosis not present

## 2020-01-14 DIAGNOSIS — M25512 Pain in left shoulder: Secondary | ICD-10-CM | POA: Diagnosis not present

## 2020-01-14 DIAGNOSIS — E663 Overweight: Secondary | ICD-10-CM | POA: Diagnosis not present

## 2020-01-14 DIAGNOSIS — R7989 Other specified abnormal findings of blood chemistry: Secondary | ICD-10-CM | POA: Diagnosis not present

## 2020-01-14 DIAGNOSIS — Z6826 Body mass index (BMI) 26.0-26.9, adult: Secondary | ICD-10-CM | POA: Diagnosis not present

## 2020-01-15 ENCOUNTER — Other Ambulatory Visit: Payer: Self-pay | Admitting: Internal Medicine

## 2020-01-15 DIAGNOSIS — N1832 Chronic kidney disease, stage 3b: Secondary | ICD-10-CM

## 2020-01-16 DIAGNOSIS — M159 Polyosteoarthritis, unspecified: Secondary | ICD-10-CM | POA: Diagnosis not present

## 2020-01-16 DIAGNOSIS — I4892 Unspecified atrial flutter: Secondary | ICD-10-CM | POA: Diagnosis not present

## 2020-01-16 DIAGNOSIS — I5022 Chronic systolic (congestive) heart failure: Secondary | ICD-10-CM | POA: Diagnosis not present

## 2020-01-16 DIAGNOSIS — N1832 Chronic kidney disease, stage 3b: Secondary | ICD-10-CM | POA: Diagnosis not present

## 2020-01-16 DIAGNOSIS — E7800 Pure hypercholesterolemia, unspecified: Secondary | ICD-10-CM | POA: Diagnosis not present

## 2020-01-16 DIAGNOSIS — I130 Hypertensive heart and chronic kidney disease with heart failure and stage 1 through stage 4 chronic kidney disease, or unspecified chronic kidney disease: Secondary | ICD-10-CM | POA: Diagnosis not present

## 2020-01-16 DIAGNOSIS — I482 Chronic atrial fibrillation, unspecified: Secondary | ICD-10-CM | POA: Diagnosis not present

## 2020-01-16 DIAGNOSIS — R69 Illness, unspecified: Secondary | ICD-10-CM | POA: Diagnosis not present

## 2020-01-16 DIAGNOSIS — E1122 Type 2 diabetes mellitus with diabetic chronic kidney disease: Secondary | ICD-10-CM | POA: Diagnosis not present

## 2020-01-16 DIAGNOSIS — E0590 Thyrotoxicosis, unspecified without thyrotoxic crisis or storm: Secondary | ICD-10-CM | POA: Diagnosis not present

## 2020-02-01 ENCOUNTER — Other Ambulatory Visit: Payer: Medicare HMO

## 2020-02-04 DIAGNOSIS — E7800 Pure hypercholesterolemia, unspecified: Secondary | ICD-10-CM | POA: Diagnosis not present

## 2020-02-04 DIAGNOSIS — I482 Chronic atrial fibrillation, unspecified: Secondary | ICD-10-CM | POA: Diagnosis not present

## 2020-02-04 DIAGNOSIS — E875 Hyperkalemia: Secondary | ICD-10-CM | POA: Diagnosis not present

## 2020-02-04 DIAGNOSIS — N189 Chronic kidney disease, unspecified: Secondary | ICD-10-CM | POA: Diagnosis not present

## 2020-02-04 DIAGNOSIS — I5022 Chronic systolic (congestive) heart failure: Secondary | ICD-10-CM | POA: Diagnosis not present

## 2020-02-04 DIAGNOSIS — R9431 Abnormal electrocardiogram [ECG] [EKG]: Secondary | ICD-10-CM | POA: Diagnosis not present

## 2020-02-04 DIAGNOSIS — J309 Allergic rhinitis, unspecified: Secondary | ICD-10-CM | POA: Diagnosis not present

## 2020-02-04 DIAGNOSIS — I4892 Unspecified atrial flutter: Secondary | ICD-10-CM | POA: Diagnosis not present

## 2020-02-04 DIAGNOSIS — R7989 Other specified abnormal findings of blood chemistry: Secondary | ICD-10-CM | POA: Diagnosis not present

## 2020-02-04 DIAGNOSIS — L0390 Cellulitis, unspecified: Secondary | ICD-10-CM | POA: Diagnosis not present

## 2020-02-08 DIAGNOSIS — R69 Illness, unspecified: Secondary | ICD-10-CM | POA: Diagnosis not present

## 2020-02-08 DIAGNOSIS — N1832 Chronic kidney disease, stage 3b: Secondary | ICD-10-CM | POA: Diagnosis not present

## 2020-02-08 DIAGNOSIS — I130 Hypertensive heart and chronic kidney disease with heart failure and stage 1 through stage 4 chronic kidney disease, or unspecified chronic kidney disease: Secondary | ICD-10-CM | POA: Diagnosis not present

## 2020-02-08 DIAGNOSIS — M159 Polyosteoarthritis, unspecified: Secondary | ICD-10-CM | POA: Diagnosis not present

## 2020-02-08 DIAGNOSIS — E1122 Type 2 diabetes mellitus with diabetic chronic kidney disease: Secondary | ICD-10-CM | POA: Diagnosis not present

## 2020-02-08 DIAGNOSIS — I482 Chronic atrial fibrillation, unspecified: Secondary | ICD-10-CM | POA: Diagnosis not present

## 2020-02-08 DIAGNOSIS — I5022 Chronic systolic (congestive) heart failure: Secondary | ICD-10-CM | POA: Diagnosis not present

## 2020-02-08 DIAGNOSIS — I4892 Unspecified atrial flutter: Secondary | ICD-10-CM | POA: Diagnosis not present

## 2020-02-08 DIAGNOSIS — E0590 Thyrotoxicosis, unspecified without thyrotoxic crisis or storm: Secondary | ICD-10-CM | POA: Diagnosis not present

## 2020-02-08 DIAGNOSIS — E7800 Pure hypercholesterolemia, unspecified: Secondary | ICD-10-CM | POA: Diagnosis not present

## 2020-02-10 ENCOUNTER — Ambulatory Visit
Admission: RE | Admit: 2020-02-10 | Discharge: 2020-02-10 | Disposition: A | Discharge status: Home / Self Care | Payer: Medicare HMO | Source: Ambulatory Visit | Attending: Internal Medicine | Admitting: Internal Medicine

## 2020-02-10 DIAGNOSIS — E1122 Type 2 diabetes mellitus with diabetic chronic kidney disease: Secondary | ICD-10-CM | POA: Diagnosis not present

## 2020-02-10 DIAGNOSIS — M159 Polyosteoarthritis, unspecified: Secondary | ICD-10-CM | POA: Diagnosis not present

## 2020-02-10 DIAGNOSIS — I4892 Unspecified atrial flutter: Secondary | ICD-10-CM | POA: Diagnosis not present

## 2020-02-10 DIAGNOSIS — I5022 Chronic systolic (congestive) heart failure: Secondary | ICD-10-CM | POA: Diagnosis not present

## 2020-02-10 DIAGNOSIS — N1832 Chronic kidney disease, stage 3b: Secondary | ICD-10-CM

## 2020-02-10 DIAGNOSIS — R69 Illness, unspecified: Secondary | ICD-10-CM | POA: Diagnosis not present

## 2020-02-10 DIAGNOSIS — E0590 Thyrotoxicosis, unspecified without thyrotoxic crisis or storm: Secondary | ICD-10-CM | POA: Diagnosis not present

## 2020-02-10 DIAGNOSIS — I482 Chronic atrial fibrillation, unspecified: Secondary | ICD-10-CM | POA: Diagnosis not present

## 2020-02-10 DIAGNOSIS — E7800 Pure hypercholesterolemia, unspecified: Secondary | ICD-10-CM | POA: Diagnosis not present

## 2020-02-10 DIAGNOSIS — I130 Hypertensive heart and chronic kidney disease with heart failure and stage 1 through stage 4 chronic kidney disease, or unspecified chronic kidney disease: Secondary | ICD-10-CM | POA: Diagnosis not present

## 2020-02-10 DIAGNOSIS — N183 Chronic kidney disease, stage 3 unspecified: Secondary | ICD-10-CM | POA: Diagnosis not present

## 2020-02-10 IMAGING — US US RENAL
2 series · 14 of 25 positions shown · non-contrast
Comparison: None.

CLINICAL DATA: Chronic renal disease stage III B

EXAM:
RENAL / URINARY TRACT ULTRASOUND COMPLETE

[Series 1: us renal · 0.23mm/px · 12 of 30 slices shown (1 of 2)]
[im 1/30]
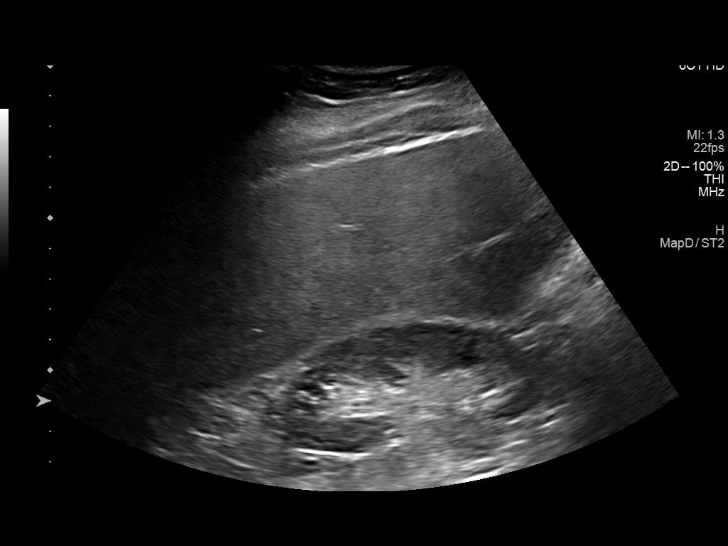
[im 3/30]
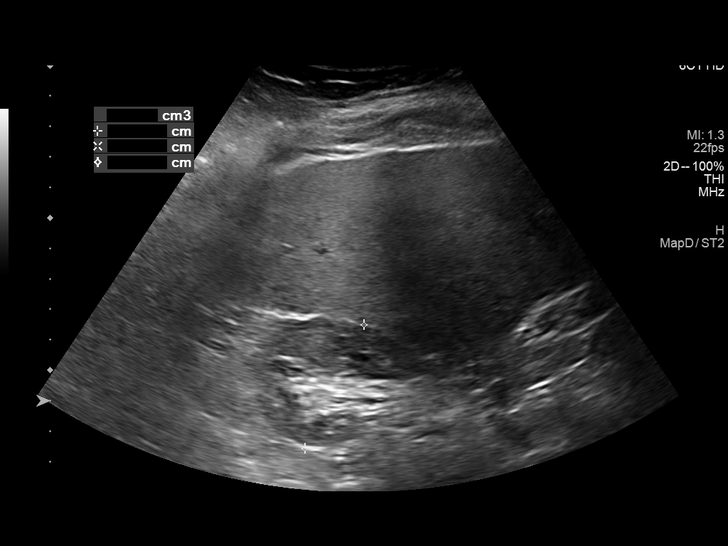
[im 6/30]
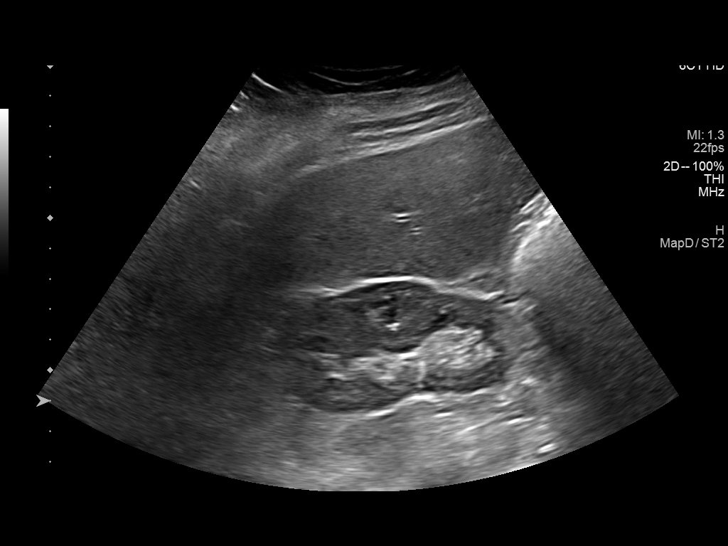
[im 9/30]
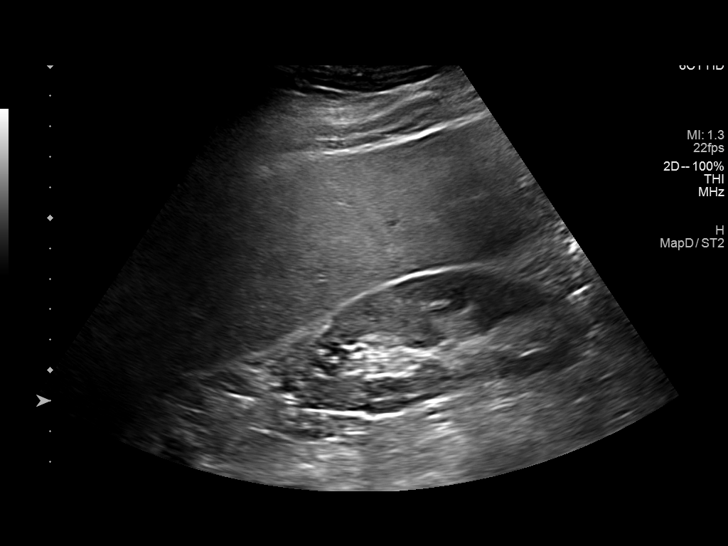
[im 12/30]
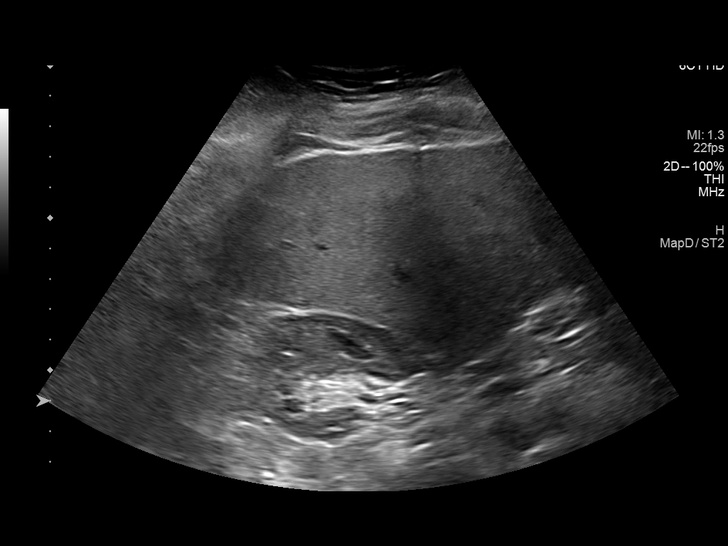
[im 13/30]
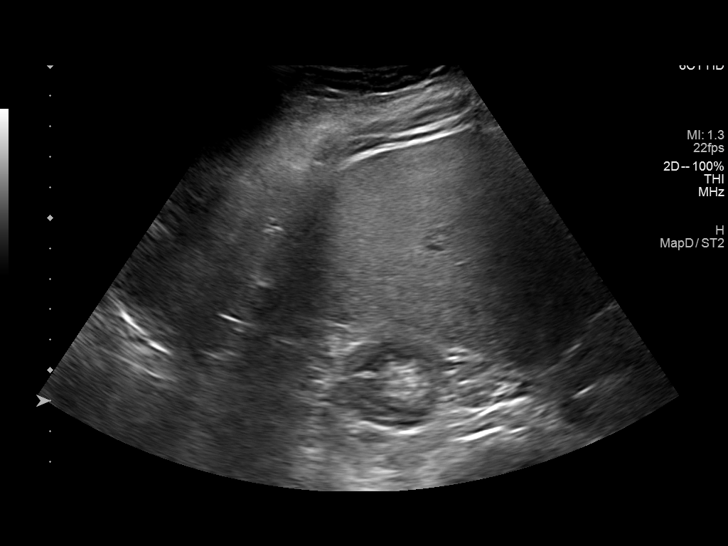
[im 16/30]
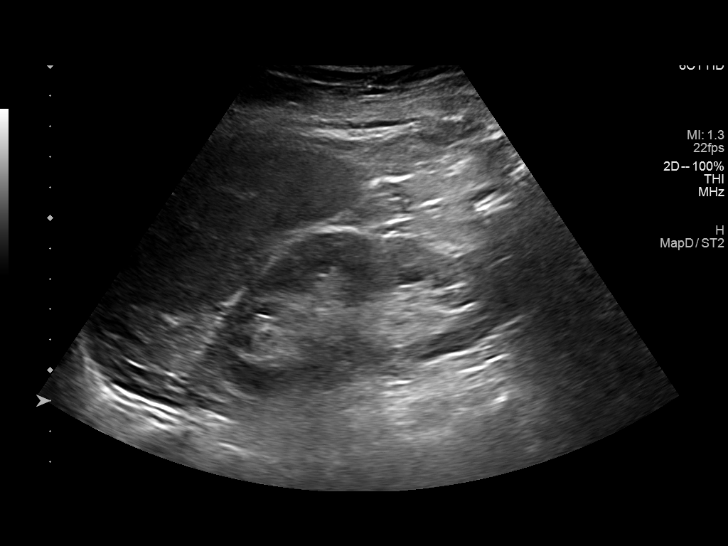
[im 18/30]
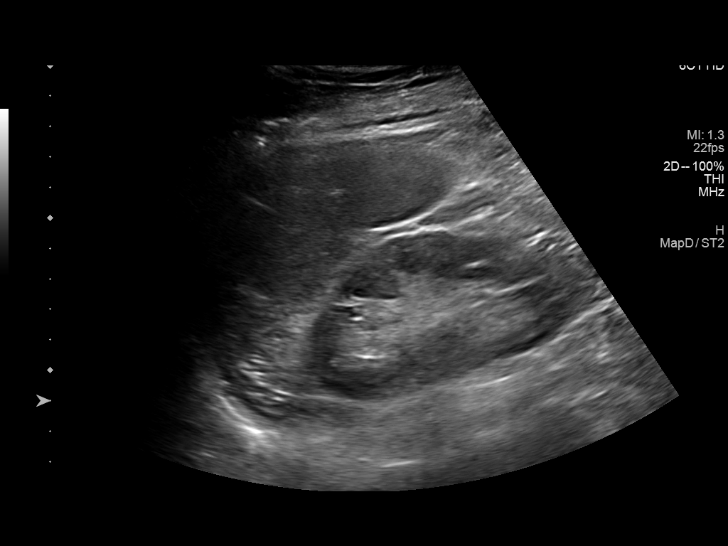
[im 21/30]
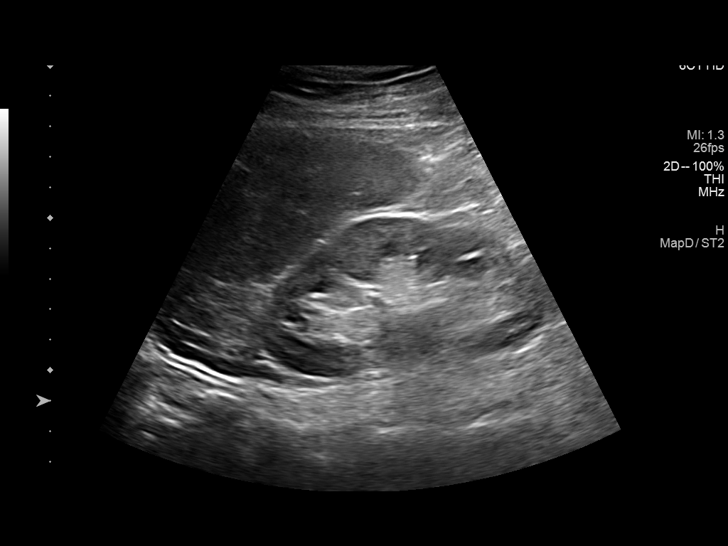
[im 23/30]
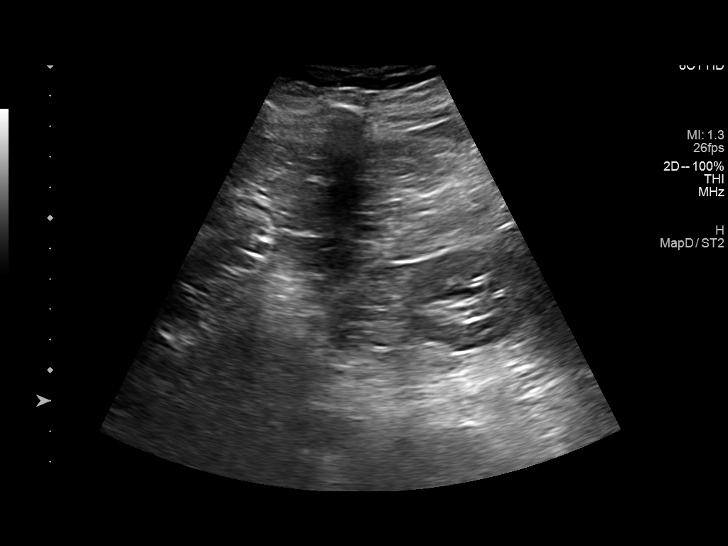
[im 25/30]
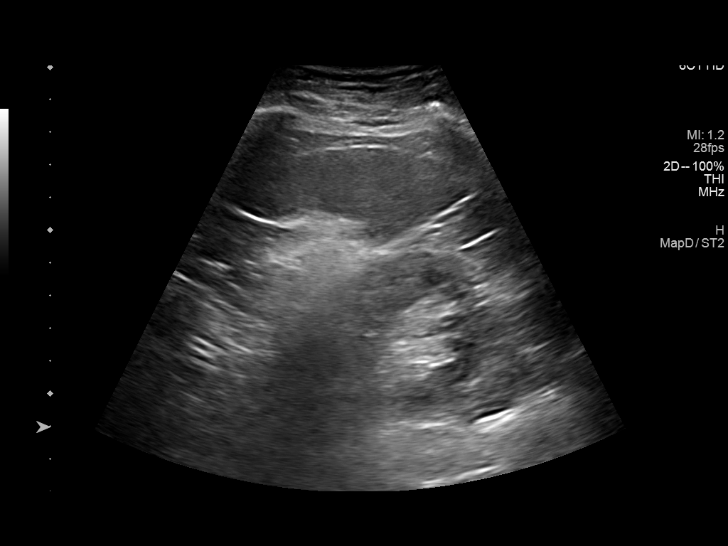
[im 28/30]
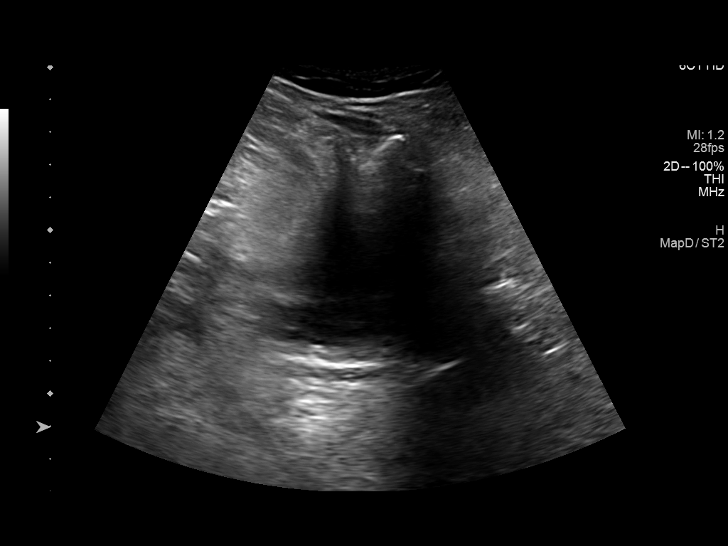

[Series 2: us renal · 0.23mm/px · 2 of 4 slices shown (2 of 2)]
[im 1/4]
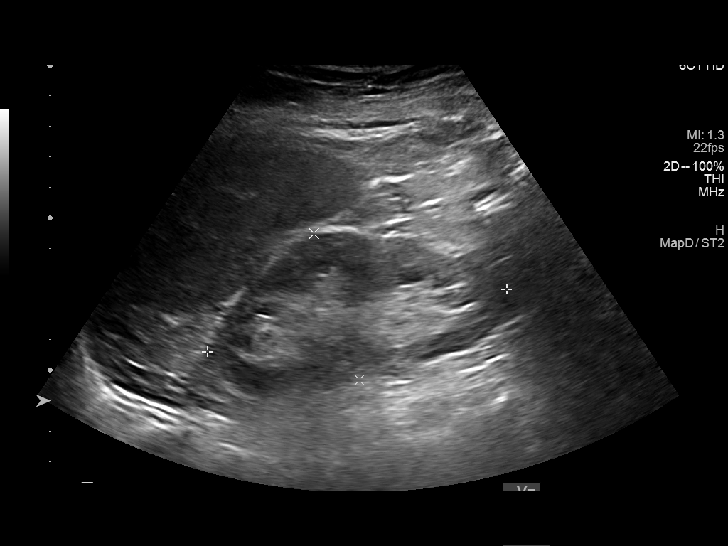
[im 4/4]
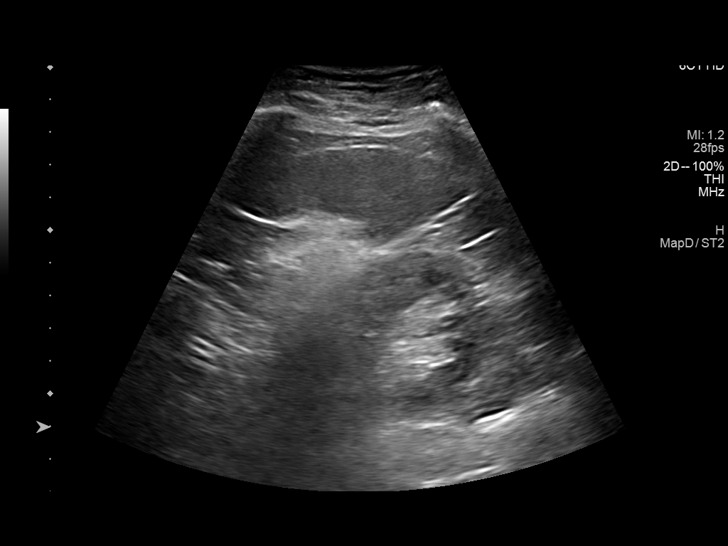

[14 of 25 positions shown; findings below may reference images not displayed]

FINDINGS: Right Kidney:

Renal measurements: 9.1 cm x 3.8 cm x 4.5 cm = volume: 80.6 mL .
Echogenicity within normal limits. No mass or hydronephrosis
visualized.

Left Kidney:

Renal measurements: 10.0 cm x 5.0 cm x 4.8 cm = volume: 128 mL.
Echogenicity within normal limits. No mass or hydronephrosis
visualized.

Bladder:

Appears normal for degree of bladder distention.

Other:

None.
IMPRESSION: Normal renal ultrasound.

## 2020-02-11 DIAGNOSIS — I4892 Unspecified atrial flutter: Secondary | ICD-10-CM | POA: Diagnosis not present

## 2020-02-11 DIAGNOSIS — E7800 Pure hypercholesterolemia, unspecified: Secondary | ICD-10-CM | POA: Diagnosis not present

## 2020-02-11 DIAGNOSIS — R69 Illness, unspecified: Secondary | ICD-10-CM | POA: Diagnosis not present

## 2020-02-11 DIAGNOSIS — I482 Chronic atrial fibrillation, unspecified: Secondary | ICD-10-CM | POA: Diagnosis not present

## 2020-02-11 DIAGNOSIS — N1832 Chronic kidney disease, stage 3b: Secondary | ICD-10-CM | POA: Diagnosis not present

## 2020-02-11 DIAGNOSIS — E0590 Thyrotoxicosis, unspecified without thyrotoxic crisis or storm: Secondary | ICD-10-CM | POA: Diagnosis not present

## 2020-02-11 DIAGNOSIS — I5022 Chronic systolic (congestive) heart failure: Secondary | ICD-10-CM | POA: Diagnosis not present

## 2020-02-11 DIAGNOSIS — M159 Polyosteoarthritis, unspecified: Secondary | ICD-10-CM | POA: Diagnosis not present

## 2020-02-11 DIAGNOSIS — I130 Hypertensive heart and chronic kidney disease with heart failure and stage 1 through stage 4 chronic kidney disease, or unspecified chronic kidney disease: Secondary | ICD-10-CM | POA: Diagnosis not present

## 2020-02-11 DIAGNOSIS — E1122 Type 2 diabetes mellitus with diabetic chronic kidney disease: Secondary | ICD-10-CM | POA: Diagnosis not present

## 2020-02-12 DIAGNOSIS — N189 Chronic kidney disease, unspecified: Secondary | ICD-10-CM | POA: Diagnosis not present

## 2020-02-12 DIAGNOSIS — L0390 Cellulitis, unspecified: Secondary | ICD-10-CM | POA: Diagnosis not present

## 2020-02-12 DIAGNOSIS — E7800 Pure hypercholesterolemia, unspecified: Secondary | ICD-10-CM | POA: Diagnosis not present

## 2020-02-12 DIAGNOSIS — I5022 Chronic systolic (congestive) heart failure: Secondary | ICD-10-CM | POA: Diagnosis not present

## 2020-02-12 DIAGNOSIS — J309 Allergic rhinitis, unspecified: Secondary | ICD-10-CM | POA: Diagnosis not present

## 2020-02-12 DIAGNOSIS — I482 Chronic atrial fibrillation, unspecified: Secondary | ICD-10-CM | POA: Diagnosis not present

## 2020-02-12 DIAGNOSIS — R110 Nausea: Secondary | ICD-10-CM | POA: Diagnosis not present

## 2020-02-12 DIAGNOSIS — R7989 Other specified abnormal findings of blood chemistry: Secondary | ICD-10-CM | POA: Diagnosis not present

## 2020-02-12 DIAGNOSIS — I4892 Unspecified atrial flutter: Secondary | ICD-10-CM | POA: Diagnosis not present

## 2020-02-12 DIAGNOSIS — B370 Candidal stomatitis: Secondary | ICD-10-CM | POA: Diagnosis not present

## 2020-02-15 DIAGNOSIS — I130 Hypertensive heart and chronic kidney disease with heart failure and stage 1 through stage 4 chronic kidney disease, or unspecified chronic kidney disease: Secondary | ICD-10-CM | POA: Diagnosis not present

## 2020-02-15 DIAGNOSIS — R69 Illness, unspecified: Secondary | ICD-10-CM | POA: Diagnosis not present

## 2020-02-15 DIAGNOSIS — I4892 Unspecified atrial flutter: Secondary | ICD-10-CM | POA: Diagnosis not present

## 2020-02-15 DIAGNOSIS — M159 Polyosteoarthritis, unspecified: Secondary | ICD-10-CM | POA: Diagnosis not present

## 2020-02-15 DIAGNOSIS — I5022 Chronic systolic (congestive) heart failure: Secondary | ICD-10-CM | POA: Diagnosis not present

## 2020-02-15 DIAGNOSIS — I482 Chronic atrial fibrillation, unspecified: Secondary | ICD-10-CM | POA: Diagnosis not present

## 2020-02-15 DIAGNOSIS — E1122 Type 2 diabetes mellitus with diabetic chronic kidney disease: Secondary | ICD-10-CM | POA: Diagnosis not present

## 2020-02-15 DIAGNOSIS — E7800 Pure hypercholesterolemia, unspecified: Secondary | ICD-10-CM | POA: Diagnosis not present

## 2020-02-15 DIAGNOSIS — N1832 Chronic kidney disease, stage 3b: Secondary | ICD-10-CM | POA: Diagnosis not present

## 2020-02-15 DIAGNOSIS — E0590 Thyrotoxicosis, unspecified without thyrotoxic crisis or storm: Secondary | ICD-10-CM | POA: Diagnosis not present

## 2020-02-17 DIAGNOSIS — I130 Hypertensive heart and chronic kidney disease with heart failure and stage 1 through stage 4 chronic kidney disease, or unspecified chronic kidney disease: Secondary | ICD-10-CM | POA: Diagnosis not present

## 2020-02-17 DIAGNOSIS — M159 Polyosteoarthritis, unspecified: Secondary | ICD-10-CM | POA: Diagnosis not present

## 2020-02-17 DIAGNOSIS — E1122 Type 2 diabetes mellitus with diabetic chronic kidney disease: Secondary | ICD-10-CM | POA: Diagnosis not present

## 2020-02-17 DIAGNOSIS — R9431 Abnormal electrocardiogram [ECG] [EKG]: Secondary | ICD-10-CM | POA: Diagnosis not present

## 2020-02-17 DIAGNOSIS — I5022 Chronic systolic (congestive) heart failure: Secondary | ICD-10-CM | POA: Diagnosis not present

## 2020-02-17 DIAGNOSIS — I482 Chronic atrial fibrillation, unspecified: Secondary | ICD-10-CM | POA: Diagnosis not present

## 2020-02-17 DIAGNOSIS — I4892 Unspecified atrial flutter: Secondary | ICD-10-CM | POA: Diagnosis not present

## 2020-02-17 DIAGNOSIS — E875 Hyperkalemia: Secondary | ICD-10-CM | POA: Diagnosis not present

## 2020-02-17 DIAGNOSIS — N390 Urinary tract infection, site not specified: Secondary | ICD-10-CM | POA: Diagnosis not present

## 2020-02-17 DIAGNOSIS — N189 Chronic kidney disease, unspecified: Secondary | ICD-10-CM | POA: Diagnosis not present

## 2020-02-17 DIAGNOSIS — R69 Illness, unspecified: Secondary | ICD-10-CM | POA: Diagnosis not present

## 2020-02-17 DIAGNOSIS — N1832 Chronic kidney disease, stage 3b: Secondary | ICD-10-CM | POA: Diagnosis not present

## 2020-02-17 DIAGNOSIS — J309 Allergic rhinitis, unspecified: Secondary | ICD-10-CM | POA: Diagnosis not present

## 2020-02-17 DIAGNOSIS — E0590 Thyrotoxicosis, unspecified without thyrotoxic crisis or storm: Secondary | ICD-10-CM | POA: Diagnosis not present

## 2020-02-17 DIAGNOSIS — E7800 Pure hypercholesterolemia, unspecified: Secondary | ICD-10-CM | POA: Diagnosis not present

## 2020-02-17 DIAGNOSIS — R7989 Other specified abnormal findings of blood chemistry: Secondary | ICD-10-CM | POA: Diagnosis not present

## 2020-02-19 ENCOUNTER — Ambulatory Visit: Payer: Medicare HMO | Admitting: Cardiology

## 2020-02-23 ENCOUNTER — Encounter (HOSPITAL_COMMUNITY): Payer: Self-pay | Admitting: Emergency Medicine

## 2020-02-23 ENCOUNTER — Emergency Department (HOSPITAL_COMMUNITY)
Admission: EM | Admit: 2020-02-23 | Discharge: 2020-02-23 | Disposition: A | Discharge status: Home / Self Care | Payer: Medicare HMO | Attending: Emergency Medicine | Admitting: Emergency Medicine

## 2020-02-23 ENCOUNTER — Emergency Department (HOSPITAL_COMMUNITY): Payer: Medicare HMO

## 2020-02-23 ENCOUNTER — Other Ambulatory Visit: Payer: Self-pay

## 2020-02-23 DIAGNOSIS — Z7901 Long term (current) use of anticoagulants: Secondary | ICD-10-CM | POA: Diagnosis not present

## 2020-02-23 DIAGNOSIS — S0990XA Unspecified injury of head, initial encounter: Secondary | ICD-10-CM | POA: Diagnosis not present

## 2020-02-23 DIAGNOSIS — E1122 Type 2 diabetes mellitus with diabetic chronic kidney disease: Secondary | ICD-10-CM | POA: Insufficient documentation

## 2020-02-23 DIAGNOSIS — Z7984 Long term (current) use of oral hypoglycemic drugs: Secondary | ICD-10-CM | POA: Diagnosis not present

## 2020-02-23 DIAGNOSIS — N183 Chronic kidney disease, stage 3 unspecified: Secondary | ICD-10-CM | POA: Diagnosis not present

## 2020-02-23 DIAGNOSIS — I5022 Chronic systolic (congestive) heart failure: Secondary | ICD-10-CM | POA: Diagnosis not present

## 2020-02-23 DIAGNOSIS — Z87891 Personal history of nicotine dependence: Secondary | ICD-10-CM | POA: Diagnosis not present

## 2020-02-23 DIAGNOSIS — I2510 Atherosclerotic heart disease of native coronary artery without angina pectoris: Secondary | ICD-10-CM | POA: Diagnosis not present

## 2020-02-23 DIAGNOSIS — M25512 Pain in left shoulder: Secondary | ICD-10-CM | POA: Diagnosis not present

## 2020-02-23 DIAGNOSIS — W19XXXA Unspecified fall, initial encounter: Secondary | ICD-10-CM

## 2020-02-23 DIAGNOSIS — R531 Weakness: Secondary | ICD-10-CM

## 2020-02-23 DIAGNOSIS — Y92009 Unspecified place in unspecified non-institutional (private) residence as the place of occurrence of the external cause: Secondary | ICD-10-CM

## 2020-02-23 DIAGNOSIS — R9082 White matter disease, unspecified: Secondary | ICD-10-CM | POA: Diagnosis not present

## 2020-02-23 DIAGNOSIS — Z79899 Other long term (current) drug therapy: Secondary | ICD-10-CM | POA: Diagnosis not present

## 2020-02-23 DIAGNOSIS — S4992XA Unspecified injury of left shoulder and upper arm, initial encounter: Secondary | ICD-10-CM | POA: Diagnosis not present

## 2020-02-23 DIAGNOSIS — E876 Hypokalemia: Secondary | ICD-10-CM

## 2020-02-23 DIAGNOSIS — R29818 Other symptoms and signs involving the nervous system: Secondary | ICD-10-CM | POA: Diagnosis present

## 2020-02-23 DIAGNOSIS — I130 Hypertensive heart and chronic kidney disease with heart failure and stage 1 through stage 4 chronic kidney disease, or unspecified chronic kidney disease: Secondary | ICD-10-CM | POA: Diagnosis not present

## 2020-02-23 DIAGNOSIS — R296 Repeated falls: Secondary | ICD-10-CM | POA: Insufficient documentation

## 2020-02-23 DIAGNOSIS — R519 Headache, unspecified: Secondary | ICD-10-CM | POA: Diagnosis not present

## 2020-02-23 DIAGNOSIS — I7090 Unspecified atherosclerosis: Secondary | ICD-10-CM | POA: Diagnosis not present

## 2020-02-23 DIAGNOSIS — G319 Degenerative disease of nervous system, unspecified: Secondary | ICD-10-CM | POA: Diagnosis not present

## 2020-02-23 DIAGNOSIS — R4182 Altered mental status, unspecified: Secondary | ICD-10-CM | POA: Diagnosis not present

## 2020-02-23 DIAGNOSIS — I6782 Cerebral ischemia: Secondary | ICD-10-CM | POA: Diagnosis not present

## 2020-02-23 LAB — BASIC METABOLIC PANEL
Anion gap: 14 (ref 5–15)
BUN: 20 mg/dL (ref 6–20)
CO2: 22 mmol/L (ref 22–32)
Calcium: 8.8 mg/dL — ABNORMAL LOW (ref 8.9–10.3)
Chloride: 95 mmol/L — ABNORMAL LOW (ref 98–111)
Creatinine, Ser: 1.63 mg/dL — ABNORMAL HIGH (ref 0.44–1.00)
GFR calc Af Amer: 40 mL/min — ABNORMAL LOW (ref >60)
GFR calc non Af Amer: 34 mL/min — ABNORMAL LOW (ref >60)
Glucose, Bld: 163 mg/dL — ABNORMAL HIGH (ref 70–99)
Potassium: 2.8 mmol/L — ABNORMAL LOW (ref 3.5–5.1)
Sodium: 131 mmol/L — ABNORMAL LOW (ref 135–145)

## 2020-02-23 LAB — URINALYSIS, ROUTINE W REFLEX MICROSCOPIC
Bilirubin Urine: NEGATIVE
Glucose, UA: NEGATIVE mg/dL
Ketones, ur: NEGATIVE mg/dL
Nitrite: NEGATIVE
Protein, ur: NEGATIVE mg/dL
Specific Gravity, Urine: 1.008 (ref 1.005–1.030)
pH: 6 (ref 5.0–8.0)

## 2020-02-23 LAB — CBC
HCT: 30.3 % — ABNORMAL LOW (ref 36.0–46.0)
Hemoglobin: 9.5 g/dL — ABNORMAL LOW (ref 12.0–15.0)
MCH: 31 pg (ref 26.0–34.0)
MCHC: 31.4 g/dL (ref 30.0–36.0)
MCV: 99 fL (ref 80.0–100.0)
Platelets: 296 10*3/uL (ref 150–400)
RBC: 3.06 MIL/uL — ABNORMAL LOW (ref 3.87–5.11)
RDW: 14.2 % (ref 11.5–15.5)
WBC: 6.9 10*3/uL (ref 4.0–10.5)
nRBC: 0 % (ref 0.0–0.2)

## 2020-02-23 IMAGING — CR DG SHOULDER 2+V*L*
3 series · 3 of 3 positions shown · non-contrast
Comparison: Left shoulder radiograph dated [DATE].

CLINICAL DATA: 58-year-old female with fall and left shoulder pain.

EXAM:
LEFT SHOULDER - 2+ VIEW

[shoulder grashey]
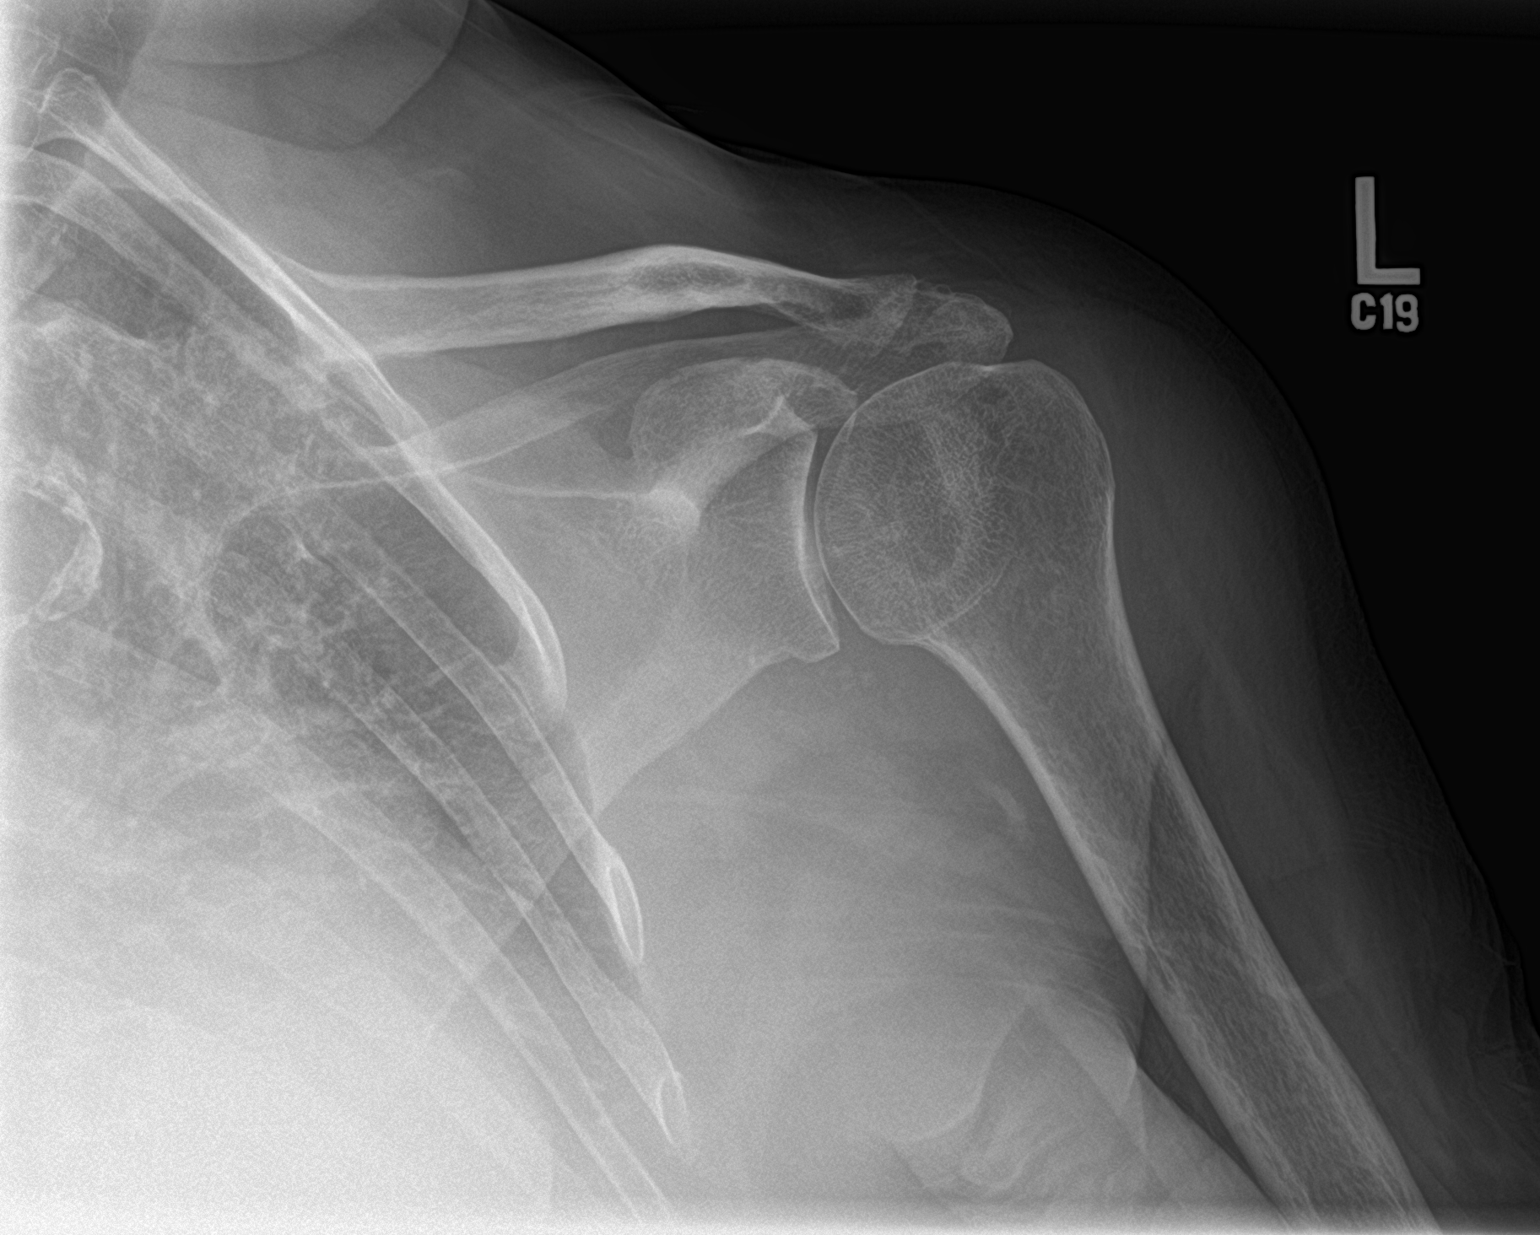

[shoulder y view]
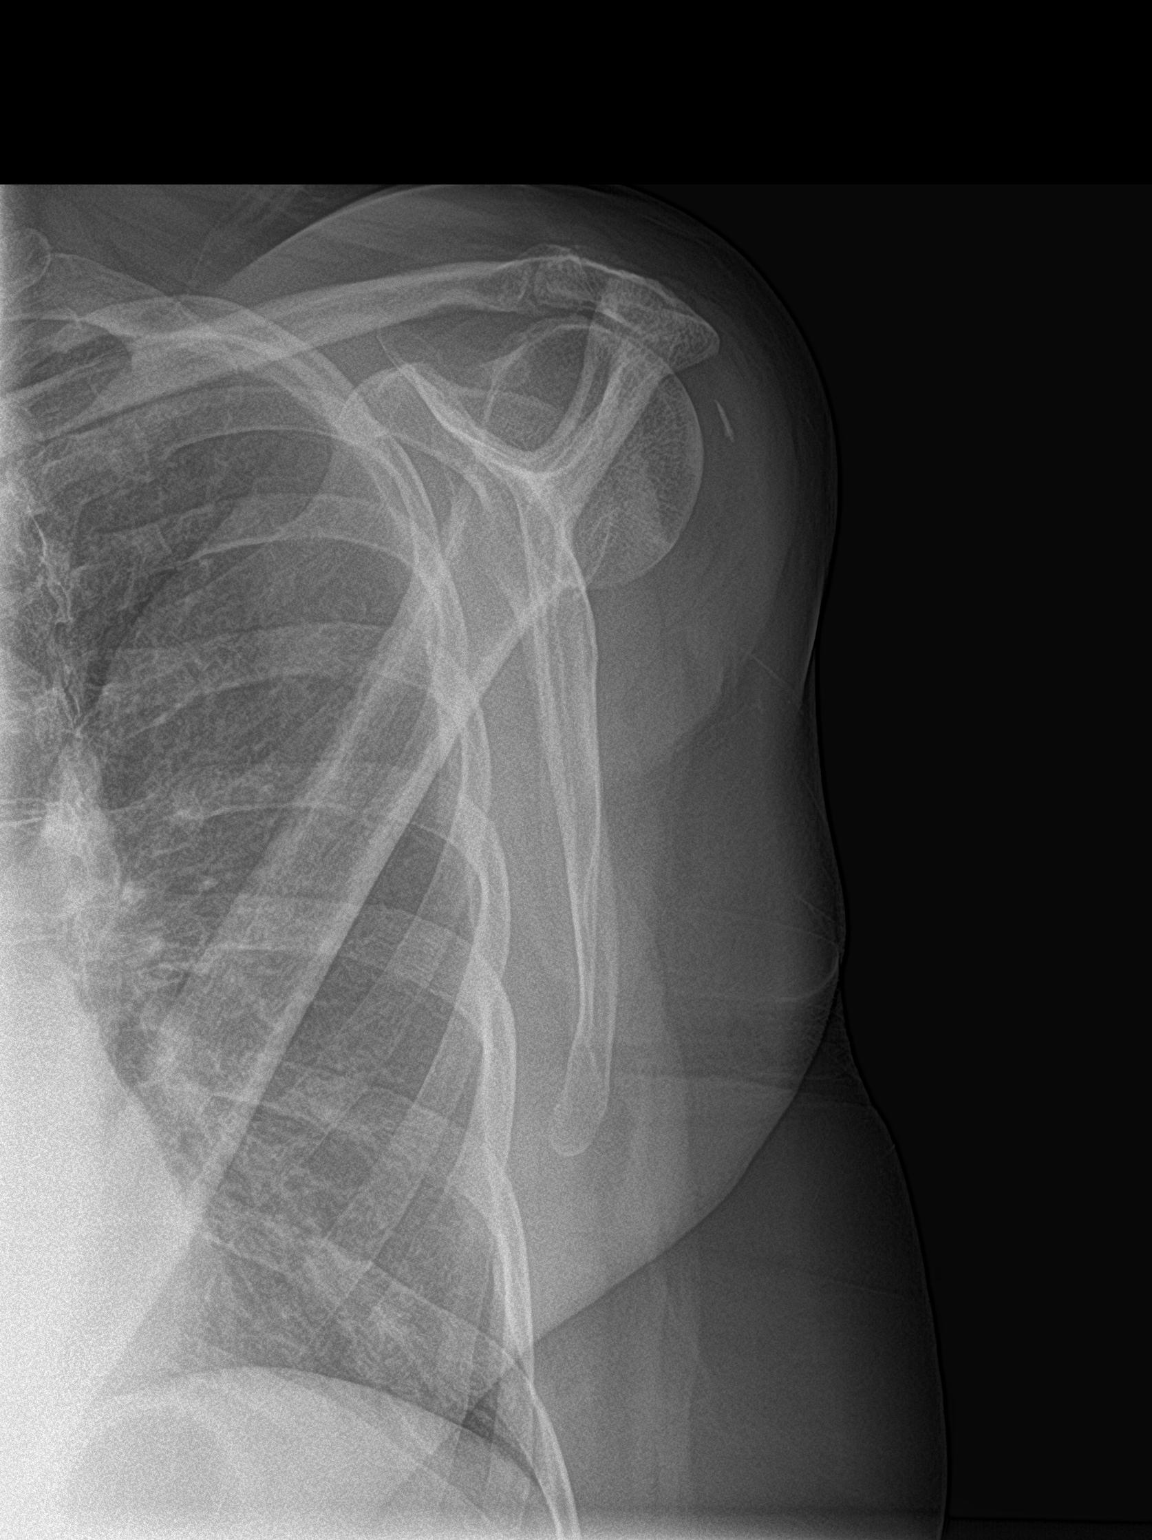

[shoulder axillary]
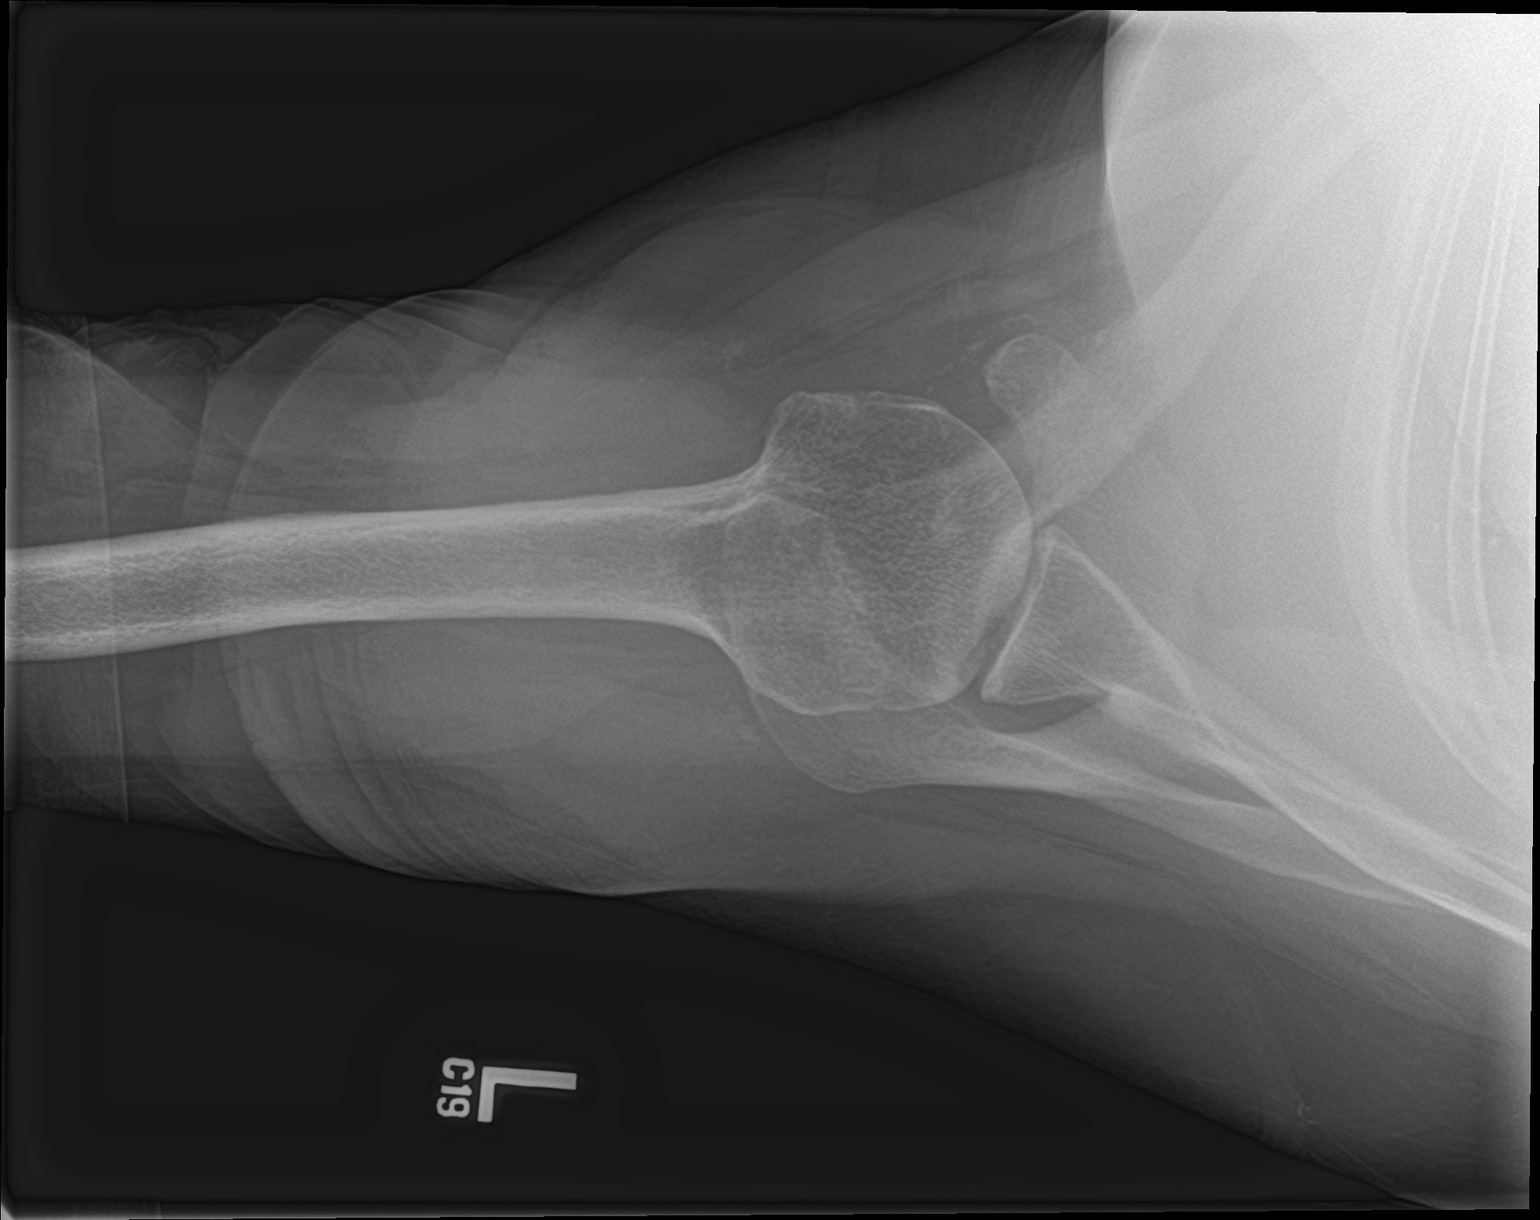

[3 of 3 positions shown; findings below may reference images not displayed]

FINDINGS: There is no acute fracture or dislocation. There is mild narrowing
of the glenohumeral joint space. The soft tissues are unremarkable.
IMPRESSION: Negative.

## 2020-02-23 IMAGING — MR MR HEAD W/O CM
12 of 13 series · 44 of 48 positions shown · non-contrast
Comparison: Head CT same day.  MRI [DATE].

CLINICAL DATA: Head trauma. Anticoagulated. Mental status changes.

EXAM:
MRI HEAD WITHOUT CONTRAST
TECHNIQUE: Multiplanar, multiecho pulse sequences of the brain and surrounding
structures were obtained without intravenous contrast.

[Series 5: DWI · axial · 3.0mm · 0.88mm/px · z∈[-154,-10]mm · 8 of 100 slices shown (1 of 4)]
[im 1/100]
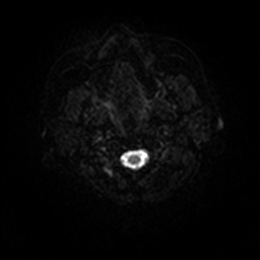
[im 15/100]
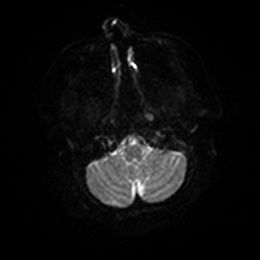
[im 29/100]
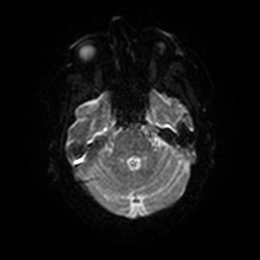
[im 43/100]
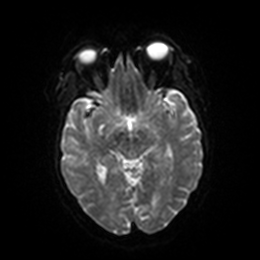
[im 57/100]
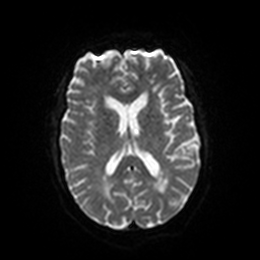
[im 71/100]
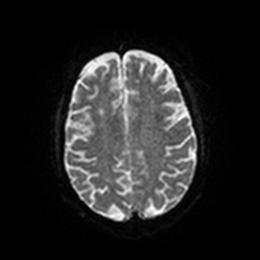
[im 85/100]
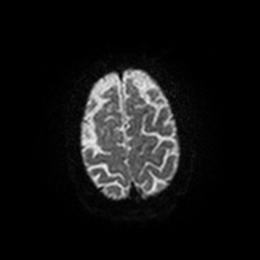
[im 100/100]
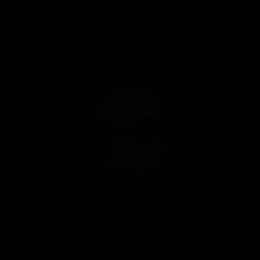

[Series 6: DWI · axial · 3.0mm · 0.88mm/px · z∈[-154,-13]mm · 4 of 48 slices shown (2 of 4)]
[im 1/48]
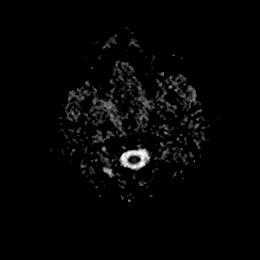
[im 16/48]
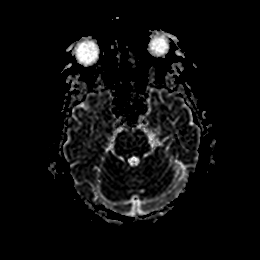
[im 32/48]
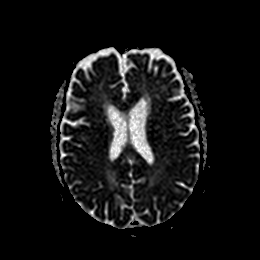
[im 48/48]
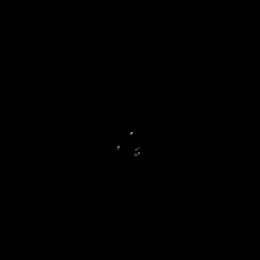

[Series 7: T1 · sagittal · 5.0mm · 0.75mm/px · 2 of 23 slices shown]
[im 1/23]
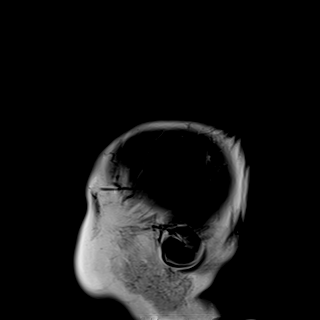
[im 23/23]
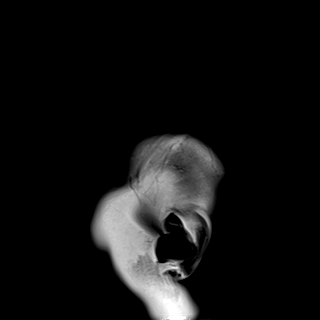

[Series 8: DWI · coronal · 4.0mm · 0.88mm/px · 6 of 76 slices shown (3 of 4)]
[im 1/76]
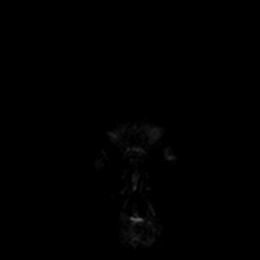
[im 16/76]
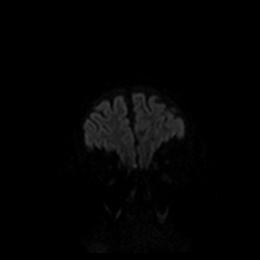
[im 31/76]
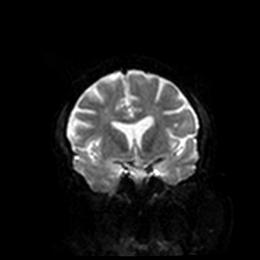
[im 46/76]
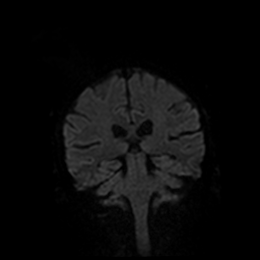
[im 61/76]
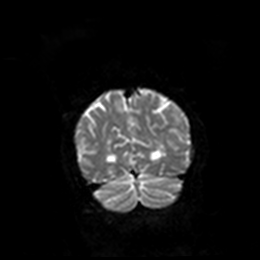
[im 76/76]
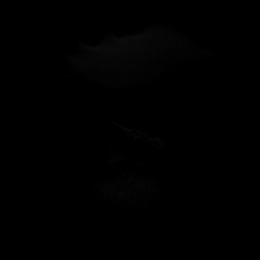

[Series 9: DWI · coronal · 4.0mm · 0.88mm/px · 3 of 38 slices shown (4 of 4)]
[im 1/38]
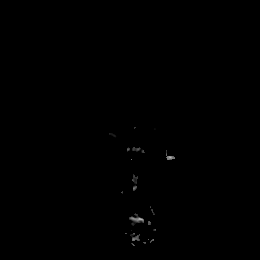
[im 19/38]
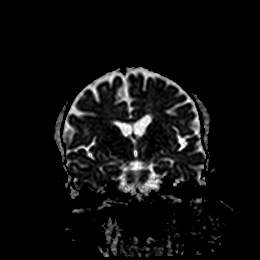
[im 38/38]
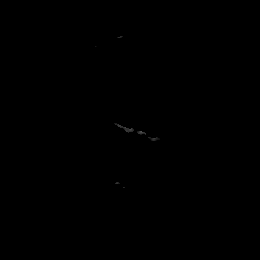

[Series 10: T2 · axial · 5.0mm · 0.90mm/px · z∈[-136,+7]mm · 2 of 25 slices shown (1 of 2)]
[im 1/25]
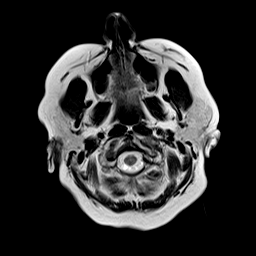
[im 25/25]
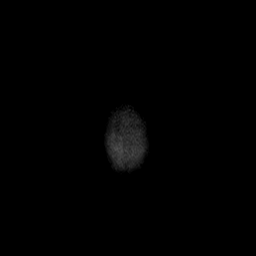

[Series 11: FLAIR · axial · 5.0mm · 0.45mm/px · z∈[-135,+8]mm · 2 of 25 slices shown]
[im 1/25]
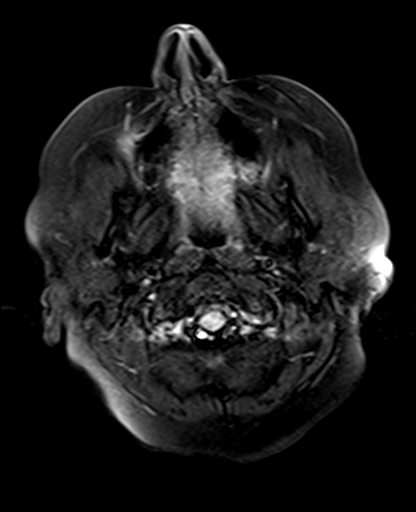
[im 25/25]
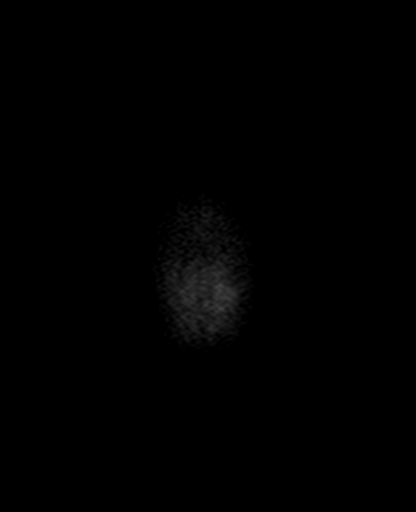

[Series 12: mag_images · axial · 3.0mm · 0.90mm/px · z∈[-137,+2]mm · 4 of 48 slices shown]
[im 1/48]
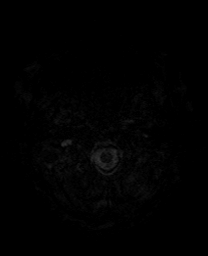
[im 16/48]
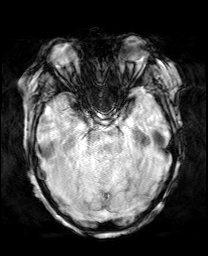
[im 32/48]
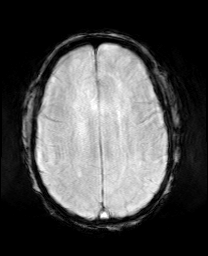
[im 48/48]
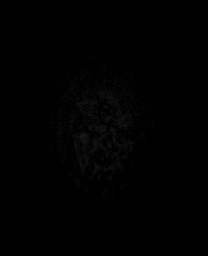

[Series 13: pha_images · axial · 3.0mm · 0.90mm/px · z∈[-137,-1]mm · 4 of 47 slices shown]
[im 1/47]
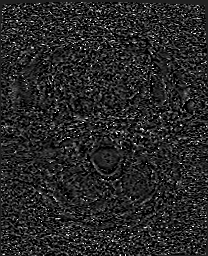
[im 16/47]
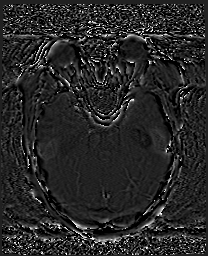
[im 31/47]
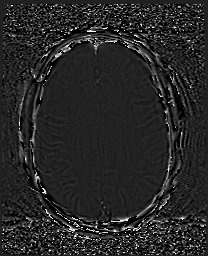
[im 47/47]
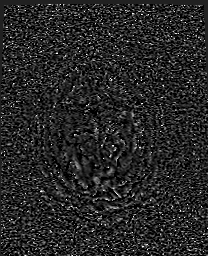

[Series 14: swi_images · axial · 3.0mm · 0.90mm/px · z∈[-137,+2]mm · 4 of 48 slices shown]
[im 1/48]
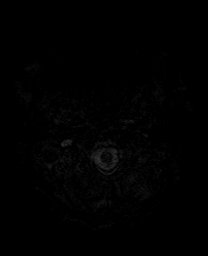
[im 16/48]
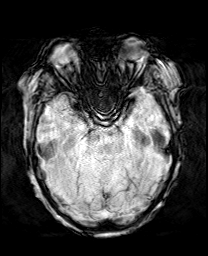
[im 32/48]
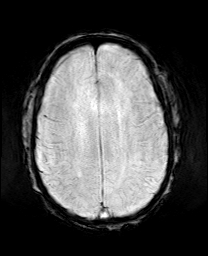
[im 48/48]
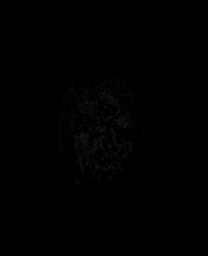

[Series 15: mip_images(sw) · axial · 24.0mm · 0.90mm/px · z∈[-127,-8]mm · 3 of 41 slices shown]
[im 1/41]
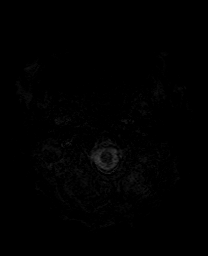
[im 21/41]
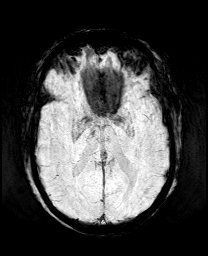
[im 41/41]
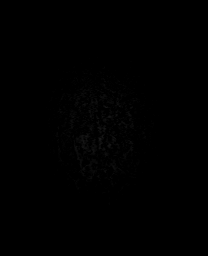

[Series 17: T2 · coronal · 5.0mm · 0.43mm/px · 2 of 31 slices shown (2 of 2)]
[im 1/31]
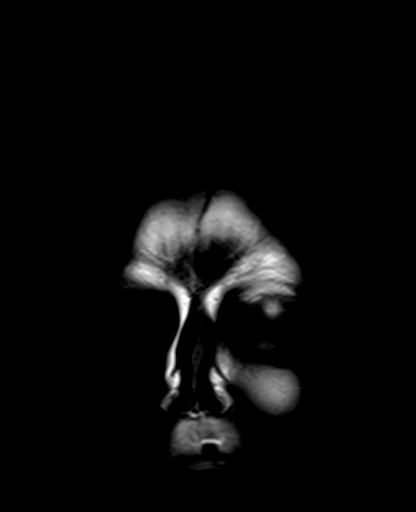
[im 31/31]
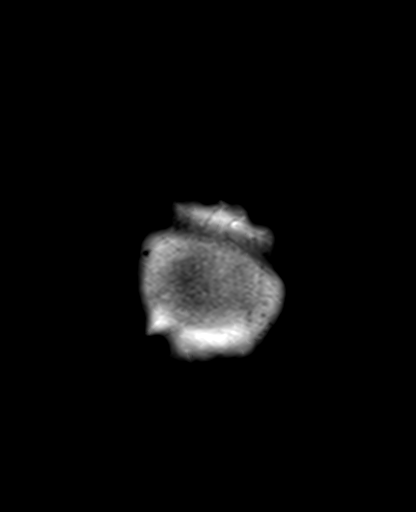

[44 of 48 positions shown; findings below may reference images not displayed]

FINDINGS: Brain: Diffusion imaging does not show any acute or subacute
infarction. No focal abnormality affects the brainstem or
cerebellum. Cerebral hemispheres show moderate small-vessel ischemic
changes affecting the deep and subcortical white matter. This is
similar compared to the previous study, with exception that the
abnormalities are more clearly seen because of less motion
degradation. It is possible that could have been some interval
worsening, but there is no acute or subacute finding. No mass
lesion, hemorrhage, hydrocephalus or extra-axial collection.

Vascular: Major vessels at the base of the brain show flow.

Skull and upper cervical spine: Negative

Sinuses/Orbits: Clear/normal

Other: None
IMPRESSION: No acute or subacute insult. Moderate changes of chronic small
vessel disease throughout the cerebral hemispheric white matter.
This is either better seen because of less motion degradation and/or
slightly progressive since the study [DATE].

## 2020-02-23 IMAGING — CT CT HEAD W/O CM
4 series · 16 of 47 positions shown, 18 images · non-contrast
Comparison: Head CT [DATE]

CLINICAL DATA: Head trauma, minor (Age >= 65y) on eliquis

EXAM:
CT HEAD WITHOUT CONTRAST
TECHNIQUE: Contiguous axial images were obtained from the base of the skull
through the vertex without intravenous contrast.

[Series 3: head without · axial · non-contrast · 0.47mm/px · z∈[-123,-3]mm · 7 of 33 slices shown, 9 images]
[im 5/33  brain]
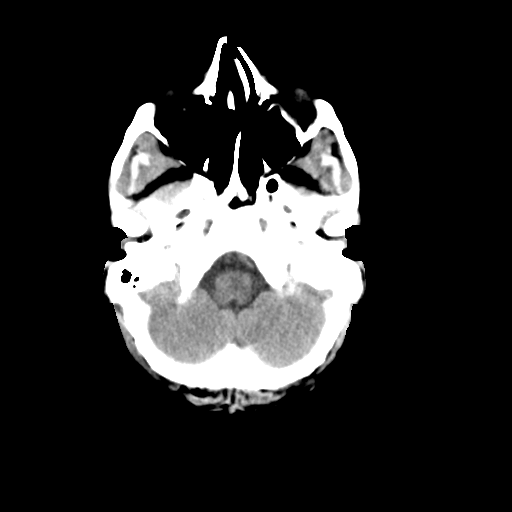
[im 5/33  bone]
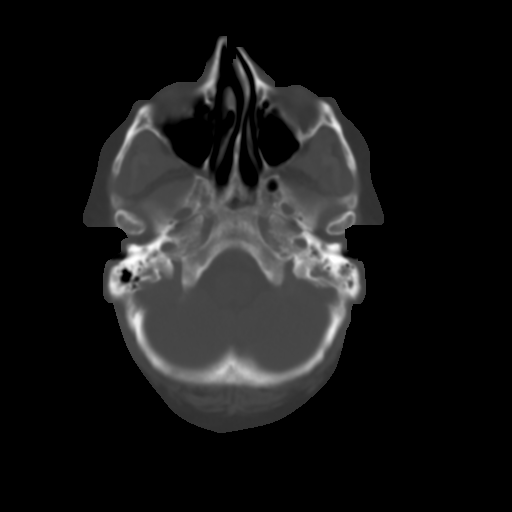
[im 9/33  brain]
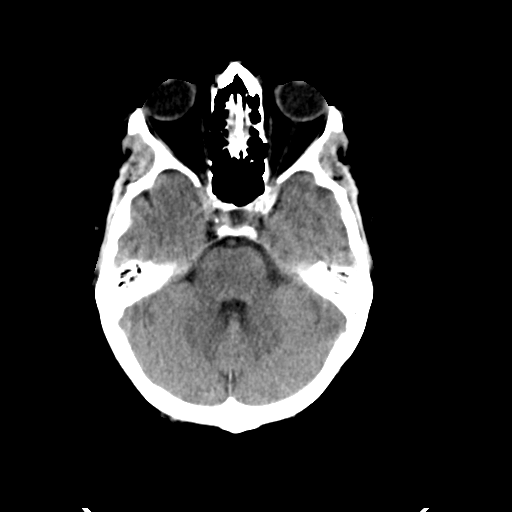
[im 13/33  brain]
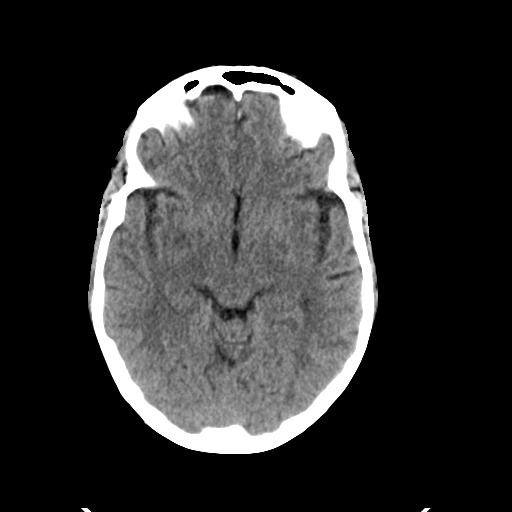
[im 17/33  brain]
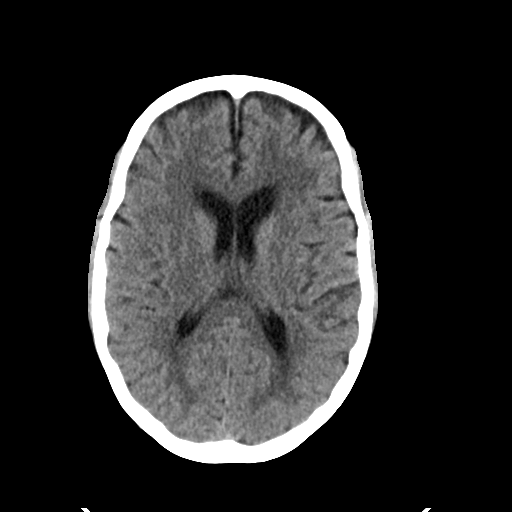
[im 21/33  brain]
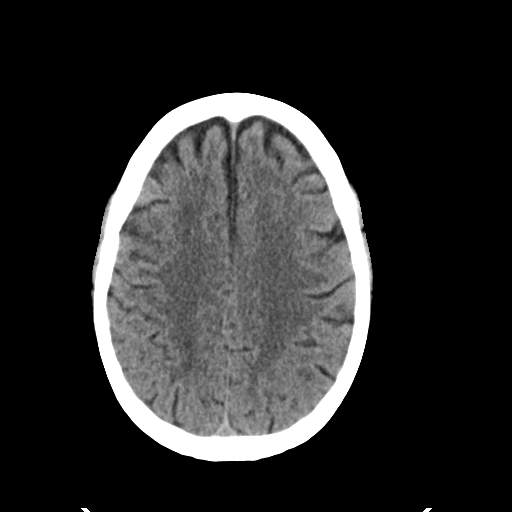
[im 21/33  bone]
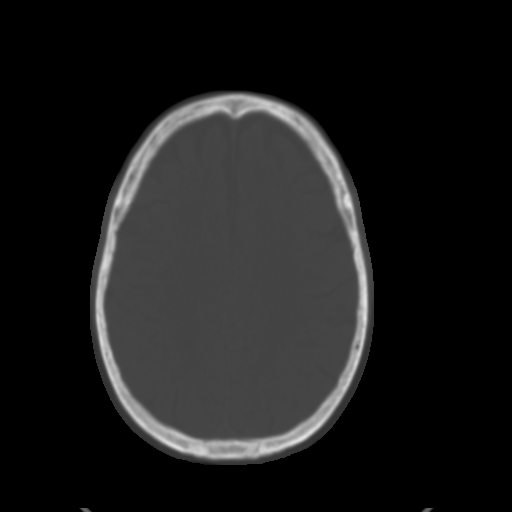
[im 25/33  brain]
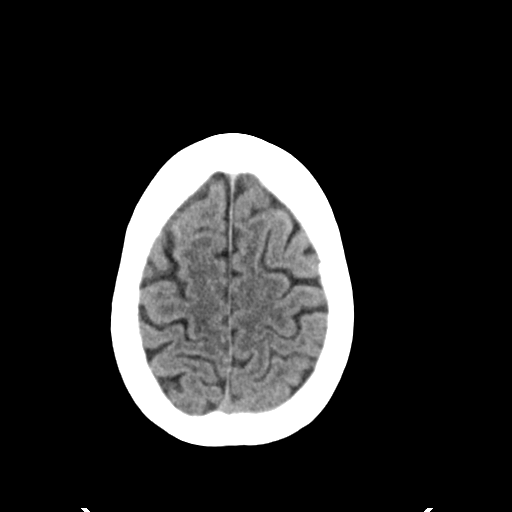
[im 29/33  brain]
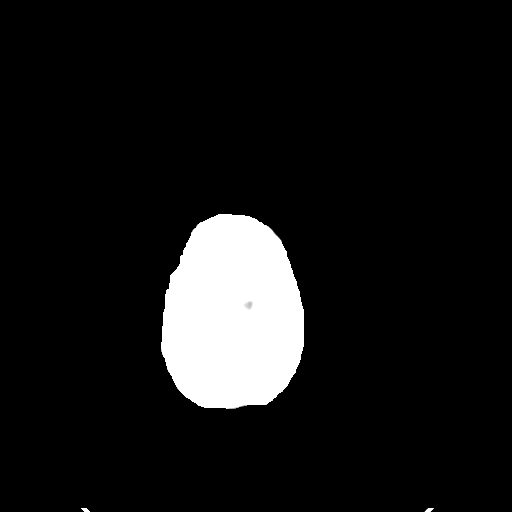

[Series 4: head bone · axial · 0.47mm/px · z∈[-127,-95]mm · 3 of 81 slices shown]
[im 9/81  bone]
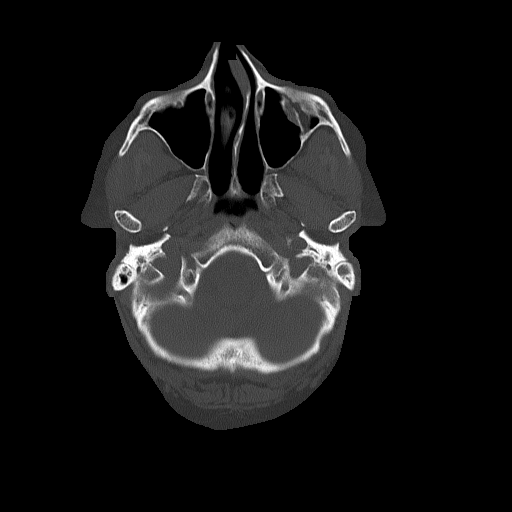
[im 17/81  bone]
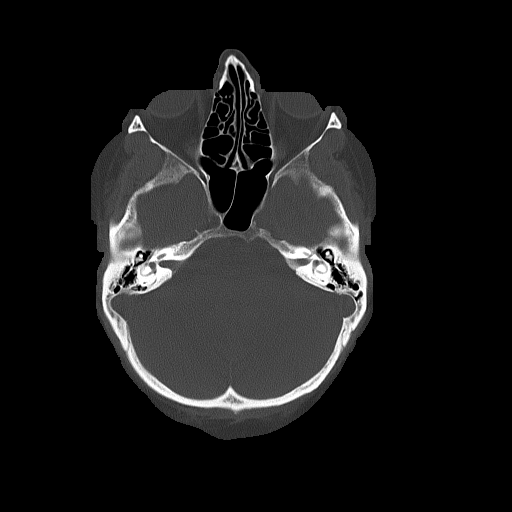
[im 25/81  bone]
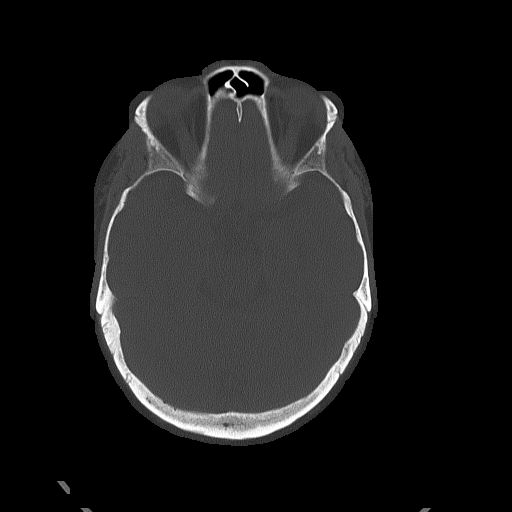

[Series 5: head without cor · coronal · non-contrast · 0.31mm/px · 3 of 73 slices shown]
[im 25/73  brain]
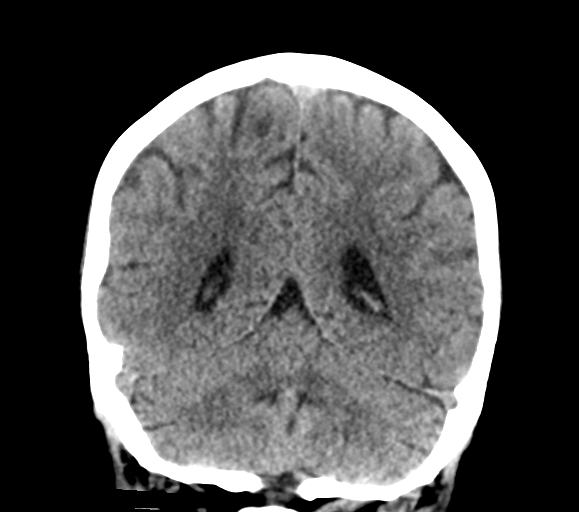
[im 33/73  brain]
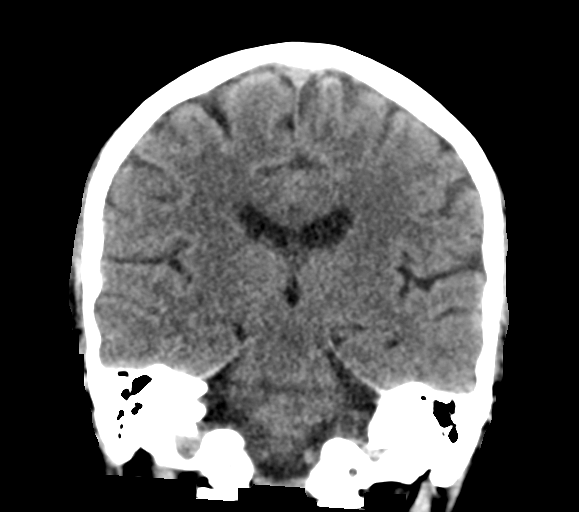
[im 41/73  brain]
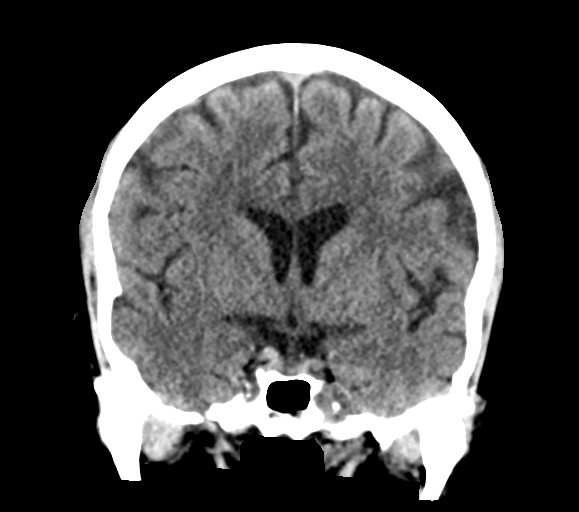

[Series 6: head without sag · sagittal · non-contrast · 0.31mm/px · 3 of 59 slices shown]
[im 20/59  brain]
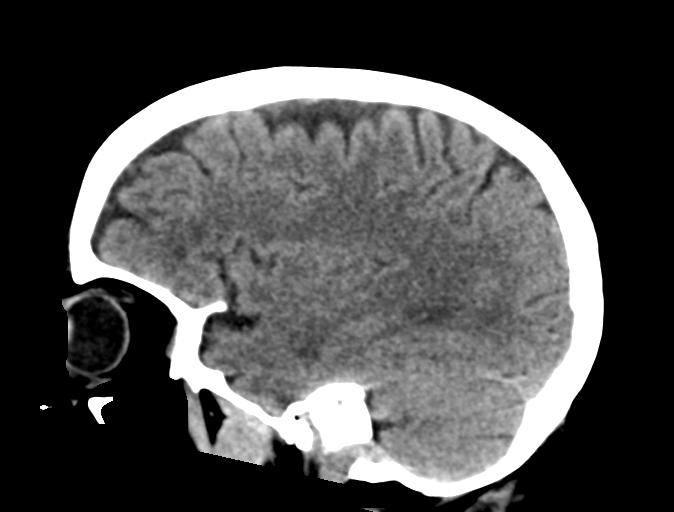
[im 30/59  brain]
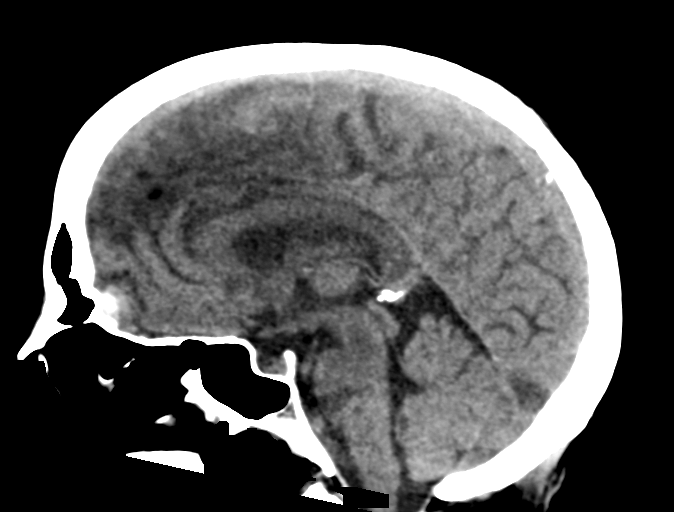
[im 39/59  brain]
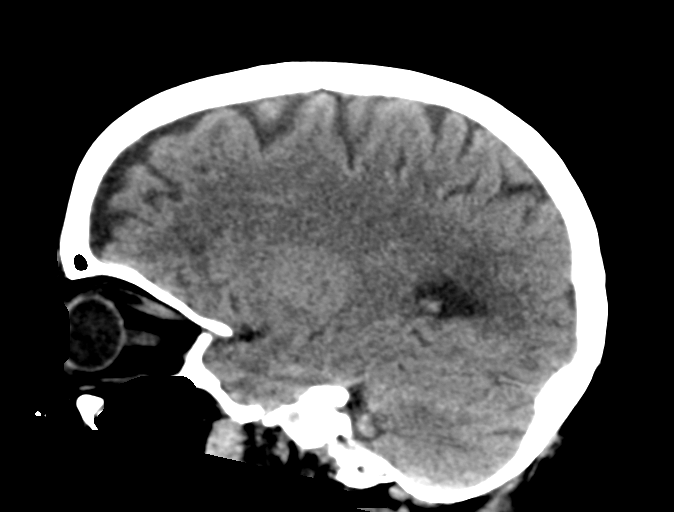

[16 of 47 positions shown; findings below may reference images not displayed]

FINDINGS: Brain: No evidence of acute infarction, hemorrhage, hydrocephalus,
extra-axial collection or mass lesion/mass effect. Mild generalized
atrophy and chronic small vessel ischemia.

Vascular: Atherosclerosis of skullbase vasculature without
hyperdense vessel or abnormal calcification.

Skull: No fracture or focal lesion.

Sinuses/Orbits: Chronic opacification of a single left mastoid air
cell. Leftward nasal septal deviation. No acute findings. Previous
left face contusion has resolved.

Other: None.
IMPRESSION: 1. No acute intracranial abnormality. No skull fracture.
2. Mild atrophy and chronic small vessel ischemia.

## 2020-02-23 MED ORDER — POTASSIUM CHLORIDE CRYS ER 20 MEQ PO TBCR
40.0000 meq | EXTENDED_RELEASE_TABLET | Freq: Once | ORAL | Status: AC
  Administered 2020-02-23: 40 meq via ORAL
  Filled 2020-02-23: qty 2

## 2020-02-23 MED ORDER — POTASSIUM CHLORIDE CRYS ER 20 MEQ PO TBCR
20.0000 meq | EXTENDED_RELEASE_TABLET | Freq: Two times a day (BID) | ORAL | 0 refills | Status: DC
Start: 2020-02-23 — End: 2023-10-04

## 2020-02-23 NOTE — ED Provider Notes (Signed)
Medical screening examination/treatment/procedure(s) were conducted as a shared visit with non-physician practitioner(s) and myself.  I personally evaluated the patient during the encounter.     Pt with difficulty with weakness, falls.  Hypokalemia noted on workup.  Renal function stable.  Anemia stable.  MRI negative for acute cva.  Will dc home on potassium supplementation.  Follow up with pcp.   Dorie Rank, MD 02/23/20 2210

## 2020-02-23 NOTE — ED Notes (Signed)
Patient transported to CT 

## 2020-02-23 NOTE — ED Triage Notes (Signed)
Pt c/o left shoulder pain after a fall x 2 days ago, denies hitting her head. A&O x4. Pt also concerned that she may have a UTI, also reports difficulty standing that has progressively gotten worse.

## 2020-02-23 NOTE — ED Provider Notes (Signed)
[facility] EMERGENCY DEPARTMENT Provider Note   CSN: [id number] Arrival date & time: 02/23/20  1542     History Chief Complaint  Patient presents with  . Fall    Brianna Porter is a 59 y.o. female.  59 year old female presents for evaluation after multiple falls.  Patient's daughter is at bedside and assists with history, states that she has seemed off for the past 2 weeks, does not seem to be mentating at baseline at times and states this has been worse for the past 3 days.  Patient is on Eliquis for A. fib, states that she has had multiple falls in the past 2 weeks and attributes this to right leg weakness, also reports left shoulder pain and is concerned her shoulder is broken as she feels like her shoulders "clunking."  Patient states that she has hit her head several times with her falls, no loss of consciousness.  Patient is also concerned she may have a UTI, no urinary symptoms however thinks that she may be weak secondary to a UTI.  No other complaints or concerns today.        Past Medical History:  Diagnosis Date  . Acute gastric ulcer with hemorrhage   . Acute systolic heart failure ([place]) 05/07/2019  . AKI (acute kidney injury) ([place]) 05/07/2019  . Altered mental status 05/07/2019  . Anemia 04/23/2019  . Anxiety and depression   . Atrial fibrillation with rapid ventricular response ([place]) 04/29/2019  . Atrial flutter ([place])   . CAD (coronary artery disease)   . Chronic systolic CHF (congestive heart failure) (HCC)    a. dx in setting of atrial fib/flutter - possibly tachy mediated. Coronary CTA with only mild CAD in 04/2019.  Marland Kitchen CKD (chronic kidney disease) stage 3, GFR 30-59 ml/min 04/23/2019  . CKD (chronic kidney disease), stage II   . Confusion    a. persistent confusion during 04/2019 admission of unclear cause. Home meds adjusted. CT/MRI brain nonacute.  . Depression 03/02/2019  . Diabetes ([place])   . Edema   . Elevated liver function tests   . Essential  hypertension   . Fall 12/23/2019  . Gastrointestinal hemorrhage   . Hypercholesteremia   . Hyperkalemia   . Hyperlipidemia 04/23/2019  . Hypertension   . Hypertensive heart and chronic kidney disease with systolic congestive heart failure ([place])   . Hypertensive heart disease 03/02/2019  . Hyperthyroidism   . Hypokalemia   . Hypokalemia   . Hypomagnesemia   . Hyponatremia   . Iron deficiency anemia   . Mild CAD    a. Coronary CT 04/2019 - Minimal, Non-obstructive CAD.  Marland Kitchen NICM (nonischemic cardiomyopathy) ([place])   . Osteoarthritis 03/02/2019  . Persistent atrial fibrillation ([place])   . Pressure injury of skin 07/09/2019  . Prolonged Q-T interval on ECG 04/30/2019  . Prolonged QT interval   . Symptomatic anemia 06/28/2019  . Type 2 diabetes mellitus with complication, without long-term current use of insulin ([place]) 03/02/2019    Patient Active Problem List   Diagnosis Date Noted  . Fall 12/23/2019  . Gastrointestinal hemorrhage   . Pressure injury of skin 07/09/2019  . Acute gastric ulcer with hemorrhage   . Symptomatic anemia 06/28/2019  . Elevated liver function tests   . NICM (nonischemic cardiomyopathy) ([place])   . Mild CAD   . Confusion   . Diabetes ([place])   . Iron deficiency anemia   . Hyperthyroidism   . Altered mental status 05/07/2019  . Acute systolic  heart failure ([place]) 05/07/2019  . AKI (acute kidney injury) ([place]) 05/07/2019  . Hyponatremia   . CAD (coronary artery disease)   . Essential hypertension   . Hypokalemia   . Prolonged Q-T interval on ECG 04/30/2019  . Atrial fibrillation with rapid ventricular response ([place]) 04/29/2019  . Persistent atrial fibrillation ([place])   . Hyperlipidemia 04/23/2019  . Anemia 04/23/2019  . CKD (chronic kidney disease) stage 3, GFR 30-59 ml/min 04/23/2019  . Depression 03/02/2019  . Osteoarthritis 03/02/2019  . Type 2 diabetes mellitus with complication, without long-term current use of insulin ([place]) 03/02/2019  . Hypertensive heart  disease 03/02/2019    Past Surgical History:  Procedure Laterality Date  . BIOPSY  06/29/2019   Procedure: BIOPSY;  Surgeon: Irene Shipper, MD;  Location: [facility];  Service: Endoscopy;;  . CARDIOVERSION N/A 04/29/2019   Procedure: CARDIOVERSION;  Surgeon: Sanda Klein, MD;  Location: [facility];  Service: Cardiovascular;  Laterality: N/A;  . CARDIOVERSION N/A 05/04/2019   Procedure: CARDIOVERSION;  Surgeon: Josue Hector, MD;  Location: [facility];  Service: Cardiovascular;  Laterality: N/A;  . COLONOSCOPY WITH PROPOFOL N/A 07/12/2019   Procedure: COLONOSCOPY WITH PROPOFOL;  Surgeon: Doran Stabler, MD;  Location: [facility];  Service: Gastroenterology;  Laterality: N/A;  . ENTEROSCOPY N/A 07/10/2019   Procedure: ENTEROSCOPY;  Surgeon: Doran Stabler, MD;  Location: [facility];  Service: Gastroenterology;  Laterality: N/A;  . ESOPHAGOGASTRODUODENOSCOPY  06/29/2019  . ESOPHAGOGASTRODUODENOSCOPY (EGD) WITH PROPOFOL N/A 06/29/2019   Procedure: ESOPHAGOGASTRODUODENOSCOPY (EGD) WITH PROPOFOL;  Surgeon: Irene Shipper, MD;  Location: [facility] ENDOSCOPY;  Service: Endoscopy;  Laterality: N/A;  . GIVENS CAPSULE STUDY N/A 07/12/2019   Procedure: GIVENS CAPSULE STUDY;  Surgeon: Doran Stabler, MD;  Location: [facility];  Service: Gastroenterology;  Laterality: N/A;  . HEMOSTASIS CLIP PLACEMENT  07/12/2019   Procedure: HEMOSTASIS CLIP PLACEMENT;  Surgeon: Doran Stabler, MD;  Location: [facility];  Service: Gastroenterology;;  . POLYPECTOMY  07/12/2019   Procedure: POLYPECTOMY;  Surgeon: Doran Stabler, MD;  Location: [facility];  Service: Gastroenterology;;  . SMALL BOWEL ENTEROSCOPY  07/10/2019  . SUBMUCOSAL TATTOO INJECTION  07/10/2019   Procedure: SUBMUCOSAL TATTOO INJECTION;  Surgeon: Doran Stabler, MD;  Location[facility];  Service: Gastroenterology;;  . TEE WITHOUT CARDIOVERSION  04/29/2019  . TEE WITHOUT CARDIOVERSION N/A 04/29/2019   Procedure:  TRANSESOPHAGEAL ECHOCARDIOGRAM (TEE);  Surgeon: Sanda Klein, MD;  Location: [facility] ENDOSCOPY;  Service: Cardiovascular;  Laterality: N/A;  . TUBAL LIGATION       OB History   No obstetric history on file.     Family History  Problem Relation Age of Onset  . Cancer Mother   . Heart disease Mother   . Hypercholesterolemia Mother   . Osteoarthritis Mother   . Stroke Mother   . Heart disease Father   . Hypercholesterolemia Brother     Social History   Tobacco Use  . Smoking status: Former Smoker    Packs/day: 2.00    Years: 30.00    Pack years: 60.00    Types: Cigarettes    Quit date: 2001    Years since quitting: 20.5  . Smokeless tobacco: Never Used  Vaping Use  . Vaping Use: Never used  Substance Use Topics  . Alcohol use: Never  . Drug use: Never    Home Medications Prior to Admission medications   Medication Sig Start Date End Date Taking? Authorizing Provider  acetaminophen (TYLENOL)  325 MG tablet Take 650 mg by mouth every 6 (six) hours as needed for mild pain.    [provider]  albuterol (VENTOLIN HFA) 108 (90 Base) MCG/ACT inhaler Inhale 2 puffs into the lungs every 6 (six) hours as needed for wheezing or shortness of breath.    [provider]  amiodarone (PACERONE) 200 MG tablet Take 1 tablet (200 mg total) by mouth daily. Patient taking differently: Take 200 mg by mouth 2 (two) times daily.  10/12/19   Camnitz, Ocie Doyne, MD  apixaban (ELIQUIS) 5 MG TABS tablet Take 1 tablet (5 mg total) by mouth 2 (two) times daily. 10/12/19   Camnitz, Ocie Doyne, MD  Ascorbic Acid (VITAMIN C) 1000 MG tablet Take 1,000 mg by mouth daily.    [provider]  buPROPion (WELLBUTRIN XL) 150 MG 24 hr tablet Take 150 mg by mouth daily.    [provider]  cetirizine (ZYRTEC) 10 MG tablet Take 10 mg by mouth daily as needed for allergies.    [provider]  cholecalciferol (VITAMIN D3) 25 MCG (1000 UNIT) tablet Take 1,000 Units by  mouth daily.    [provider]  DULoxetine (CYMBALTA) 60 MG capsule Take 60 mg by mouth 2 (two) times daily. 02/26/19   [provider]  fenofibrate (TRICOR) 48 MG tablet Take 48 mg by mouth at bedtime.    [provider]  ferrous sulfate 325 (65 FE) MG tablet Take 325 mg by mouth daily.    [provider]  furosemide (LASIX) 40 MG tablet Take 1 tablet by mouth twice daily 01/08/20   Richardo Priest, MD  losartan (COZAAR) 50 MG tablet Take 50 mg by mouth daily.    [provider]  meclizine (ANTIVERT) 12.5 MG tablet Take 1 tablet (12.5 mg total) by mouth 3 (three) times daily as needed for dizziness. 12/29/19   Antonieta Pert, MD  Menthol, Topical Analgesic, (ICY HOT ADVANCED RELIEF EX) Apply 1 application topically 3 (three) times daily as needed (pain).     [provider]  metoprolol succinate (TOPROL-XL) 100 MG 24 hr tablet Take 1 tablet (100 mg total) by mouth daily. Take with or immediately following a meal. 10/12/19   Camnitz, Ocie Doyne, MD  oxyCODONE-acetaminophen (PERCOCET/ROXICET) 5-325 MG tablet Take 1 tablet by mouth 2 (two) times daily. 10/29/19   [provider]  pantoprazole (PROTONIX) 40 MG tablet Take 40 mg by mouth 2 (two) times daily.  10/15/19   [provider]  polyethylene glycol (MIRALAX / GLYCOLAX) 17 g packet Take 17 g by mouth at bedtime as needed for moderate constipation.    [provider]    Allergies    Ambien [zolpidem tartrate], Adhesive [tape], and Codeine  Review of Systems   Review of Systems  Constitutional: Negative for fever.  Respiratory: Negative for shortness of breath.   Cardiovascular: Negative for chest pain.  Gastrointestinal: Negative for abdominal pain, nausea and vomiting.  Genitourinary: Negative for dysuria and frequency.  Musculoskeletal: Positive for arthralgias.  Skin: Positive for wound.  Neurological: Positive for weakness. Negative for dizziness,  light-headedness, numbness and headaches.  Hematological: Bruises/bleeds easily.  Psychiatric/Behavioral: Positive for confusion.  All other systems reviewed and are negative.   Physical Exam Updated Vital Signs BP 128/76 (BP Location: Right Arm)   Pulse (!) 56   Temp 97.7 F (36.5 C) (Oral)   Resp 16   LMP  (LMP Unknown) Comment: ?10 years ago?  SpO2 100%  Physical Exam Vitals and nursing note reviewed.  Constitutional:      General: She is not in acute distress.    Appearance: She is well-developed. She is not diaphoretic.  HENT:     Head: Normocephalic and atraumatic.     Mouth/Throat:     Mouth: Mucous membranes are moist.  Eyes:     Extraocular Movements: Extraocular movements intact.     Pupils: Pupils are equal, round, and reactive to light.  Cardiovascular:     Rate and Rhythm: Normal rate. Rhythm irregular.     Pulses: Normal pulses.     Heart sounds: Normal heart sounds.  Pulmonary:     Effort: Pulmonary effort is normal.     Breath sounds: Normal breath sounds.  Abdominal:     Palpations: Abdomen is soft.     Tenderness: There is no abdominal tenderness.  Musculoskeletal:        General: Tenderness present. No deformity.     Cervical back: Neck supple. No tenderness.     Right lower leg: No edema.     Left lower leg: No edema.     Comments: Limited ROM left shoulder secondary to pain and crepitus.   Skin:    General: Skin is warm and dry.     Findings: Bruising present. No erythema or rash.     Comments: Bruising to arms and legs  Neurological:     Mental Status: She is alert and oriented to person, place, and time.     Cranial Nerves: No cranial nerve deficit.     Sensory: No sensory deficit.     Motor: Weakness present.     Coordination: Coordination normal.     Deep Tendon Reflexes: Reflexes normal.     Comments: Right leg weak compared to left, sensation symmetric. DP pulses present.  Psychiatric:        Behavior: Behavior normal.     ED  Results / Procedures / Treatments   Labs (all labs ordered are listed, but only abnormal results are displayed) Labs Reviewed  URINALYSIS, ROUTINE W REFLEX MICROSCOPIC - Abnormal; Notable for the following components:      Result Value   Hgb urine dipstick SMALL (*)    Leukocytes,Ua SMALL (*)    Bacteria, UA RARE (*)    Non Squamous Epithelial 0-5 (*)    All other components within normal limits  CBC - Abnormal; Notable for the following components:   RBC 3.06 (*)    Hemoglobin 9.5 (*)    HCT 30.3 (*)    All other components within normal limits  BASIC METABOLIC PANEL - Abnormal; Notable for the following components:   Sodium 131 (*)    Potassium 2.8 (*)    Chloride 95 (*)    Glucose, Bld 163 (*)    Creatinine, Ser 1.63 (*)    Calcium 8.8 (*)    GFR calc non Af Amer 34 (*)    GFR calc Af Amer 40 (*)    All other components within normal limits    EKG None  Radiology CT Head Wo Contrast  Result Date: 02/23/2020 CLINICAL DATA:  Head trauma, minor (Age >= 65y) on eliquis EXAM: CT HEAD WITHOUT CONTRAST TECHNIQUE: Contiguous axial images were obtained from the base of the skull through the vertex without intravenous contrast. COMPARISON:  Head CT 12/22/2019 FINDINGS: Brain: No evidence of acute infarction, hemorrhage, hydrocephalus, extra-axial collection or mass lesion/mass effect. Mild generalized atrophy and chronic small vessel ischemia. Vascular: Atherosclerosis of  skullbase vasculature without hyperdense vessel or abnormal calcification. Skull: No fracture or focal lesion. Sinuses/Orbits: Chronic opacification of a single left mastoid air cell. Leftward nasal septal deviation. No acute findings. Previous left face contusion has resolved. Other: None. IMPRESSION: 1. No acute intracranial abnormality. No skull fracture. 2. Mild atrophy and chronic small vessel ischemia. Electronically Signed   By: Keith Rake M.D.   On: 02/23/2020 19:49   DG Shoulder Left  Result Date:  02/23/2020 CLINICAL DATA:  59 year old female with fall and left shoulder pain. EXAM: LEFT SHOULDER - 2+ VIEW COMPARISON:  Left shoulder radiograph dated 05/01/2019. FINDINGS: There is no acute fracture or dislocation. There is mild narrowing of the glenohumeral joint space. The soft tissues are unremarkable. IMPRESSION: Negative. Electronically Signed   By: Anner Crete M.D.   On: 02/23/2020 16:41    Procedures Procedures (including critical care time)  Medications Ordered in ED Medications  potassium chloride SA (KLOR-CON) CR tablet 40 mEq (has no administration in time range)    ED Course  I have reviewed the triage vital signs and the nursing notes.  Pertinent labs & imaging results that were available during my care of the patient were reviewed by me and considered in my medical decision making (see chart for details).  Clinical Course as of Feb 22 2102  Tue Jul 06, 869  [id number] 59 year old female with concern for multiple falls, generalized weakness, more specifically weak in her right leg.  On exam, patient has multiple bruises to her extremities, she has hit her head multiple times although no loss of consciousness with her falls.  Patient is on Eliquis for A. Fib. Patient also has weakness of the right leg compared to the left however she is able to straight leg raise the right leg. CT head is unremarkable, x-ray of left shoulder shows no fracture, patient is scheduled to follow-up with orthopedics tomorrow for the shoulder. Lab work, urinalysis shows small hemoglobin small leukocytes however 0-5 BC, rare bacteria, sample is contaminated.  BMP with hypokalemia with potassium of 2.8, given oral potassium.  Otherwise no significant changes from prior labs.  CBC with mild anemia with hemoglobin of 9.5. Discussed with Dr. Tomi Bamberger, ER attending, plan is to MRI brain, if unremarkable, may consider discharge home, if abnormal, may consider admission. Discussed results and plan of care with  patient who is agreeable with plan.    [LM]    Clinical Course User Index [LM] Roque Lias   MDM Rules/Calculators/A&P                          Final Clinical Impression(s) / ED Diagnoses Final diagnoses:  Weakness  Hypokalemia  Fall in home, initial encounter  Acute pain of left shoulder    Rx / DC Orders ED Discharge Orders    None       Roque Lias 02/23/20 2105    Dorie Rank, MD 02/23/20 [phone]

## 2020-02-23 NOTE — ED Notes (Signed)
Discharge instructions discussed with pt. Pt verbalized understanding. Pt stable and ambulatory. No signature pad available. 

## 2020-02-23 NOTE — Discharge Instructions (Signed)
Continue your current medications.  Follow-up with your primary care doctor and consider seeing a neurologist for further evaluation

## 2020-03-04 ENCOUNTER — Inpatient Hospital Stay (HOSPITAL_COMMUNITY)
Admission: EM | Admit: 2020-03-04 | Discharge: 2020-03-18 | DRG: 643 | Disposition: A | Discharge status: Skilled Nursing Facility | Payer: Medicare HMO | Attending: Internal Medicine | Admitting: Internal Medicine

## 2020-03-04 ENCOUNTER — Emergency Department (HOSPITAL_COMMUNITY): Payer: Medicare HMO

## 2020-03-04 ENCOUNTER — Other Ambulatory Visit: Payer: Self-pay

## 2020-03-04 ENCOUNTER — Encounter (HOSPITAL_COMMUNITY): Payer: Self-pay

## 2020-03-04 DIAGNOSIS — E876 Hypokalemia: Secondary | ICD-10-CM | POA: Diagnosis present

## 2020-03-04 DIAGNOSIS — M47816 Spondylosis without myelopathy or radiculopathy, lumbar region: Secondary | ICD-10-CM | POA: Diagnosis present

## 2020-03-04 DIAGNOSIS — R200 Anesthesia of skin: Secondary | ICD-10-CM | POA: Diagnosis present

## 2020-03-04 DIAGNOSIS — G9340 Encephalopathy, unspecified: Secondary | ICD-10-CM | POA: Diagnosis present

## 2020-03-04 DIAGNOSIS — R531 Weakness: Secondary | ICD-10-CM

## 2020-03-04 DIAGNOSIS — I4819 Other persistent atrial fibrillation: Secondary | ICD-10-CM | POA: Diagnosis present

## 2020-03-04 DIAGNOSIS — Z8249 Family history of ischemic heart disease and other diseases of the circulatory system: Secondary | ICD-10-CM

## 2020-03-04 DIAGNOSIS — E860 Dehydration: Secondary | ICD-10-CM | POA: Diagnosis not present

## 2020-03-04 DIAGNOSIS — T462X5A Adverse effect of other antidysrhythmic drugs, initial encounter: Secondary | ICD-10-CM | POA: Diagnosis present

## 2020-03-04 DIAGNOSIS — R296 Repeated falls: Secondary | ICD-10-CM | POA: Diagnosis present

## 2020-03-04 DIAGNOSIS — G8929 Other chronic pain: Secondary | ICD-10-CM | POA: Diagnosis present

## 2020-03-04 DIAGNOSIS — R569 Unspecified convulsions: Secondary | ICD-10-CM | POA: Diagnosis not present

## 2020-03-04 DIAGNOSIS — I130 Hypertensive heart and chronic kidney disease with heart failure and stage 1 through stage 4 chronic kidney disease, or unspecified chronic kidney disease: Secondary | ICD-10-CM | POA: Diagnosis present

## 2020-03-04 DIAGNOSIS — R202 Paresthesia of skin: Secondary | ICD-10-CM | POA: Diagnosis present

## 2020-03-04 DIAGNOSIS — E039 Hypothyroidism, unspecified: Principal | ICD-10-CM | POA: Diagnosis present

## 2020-03-04 DIAGNOSIS — G253 Myoclonus: Secondary | ICD-10-CM | POA: Diagnosis present

## 2020-03-04 DIAGNOSIS — F419 Anxiety disorder, unspecified: Secondary | ICD-10-CM | POA: Diagnosis present

## 2020-03-04 DIAGNOSIS — Z993 Dependence on wheelchair: Secondary | ICD-10-CM

## 2020-03-04 DIAGNOSIS — R0902 Hypoxemia: Secondary | ICD-10-CM | POA: Diagnosis not present

## 2020-03-04 DIAGNOSIS — I4892 Unspecified atrial flutter: Secondary | ICD-10-CM | POA: Diagnosis not present

## 2020-03-04 DIAGNOSIS — Z87891 Personal history of nicotine dependence: Secondary | ICD-10-CM

## 2020-03-04 DIAGNOSIS — N1832 Chronic kidney disease, stage 3b: Secondary | ICD-10-CM | POA: Diagnosis present

## 2020-03-04 DIAGNOSIS — E785 Hyperlipidemia, unspecified: Secondary | ICD-10-CM | POA: Diagnosis present

## 2020-03-04 DIAGNOSIS — M62569 Muscle wasting and atrophy, not elsewhere classified, unspecified lower leg: Secondary | ICD-10-CM | POA: Diagnosis present

## 2020-03-04 DIAGNOSIS — Z20822 Contact with and (suspected) exposure to covid-19: Secondary | ICD-10-CM | POA: Diagnosis present

## 2020-03-04 DIAGNOSIS — Z9181 History of falling: Secondary | ICD-10-CM

## 2020-03-04 DIAGNOSIS — E0590 Thyrotoxicosis, unspecified without thyrotoxic crisis or storm: Secondary | ICD-10-CM | POA: Diagnosis present

## 2020-03-04 DIAGNOSIS — M25561 Pain in right knee: Secondary | ICD-10-CM

## 2020-03-04 DIAGNOSIS — D649 Anemia, unspecified: Secondary | ICD-10-CM | POA: Diagnosis present

## 2020-03-04 DIAGNOSIS — E1122 Type 2 diabetes mellitus with diabetic chronic kidney disease: Secondary | ICD-10-CM | POA: Diagnosis present

## 2020-03-04 DIAGNOSIS — Z885 Allergy status to narcotic agent status: Secondary | ICD-10-CM

## 2020-03-04 DIAGNOSIS — L989 Disorder of the skin and subcutaneous tissue, unspecified: Secondary | ICD-10-CM | POA: Diagnosis present

## 2020-03-04 DIAGNOSIS — Z7901 Long term (current) use of anticoagulants: Secondary | ICD-10-CM

## 2020-03-04 DIAGNOSIS — I2510 Atherosclerotic heart disease of native coronary artery without angina pectoris: Secondary | ICD-10-CM | POA: Diagnosis present

## 2020-03-04 DIAGNOSIS — R29818 Other symptoms and signs involving the nervous system: Secondary | ICD-10-CM | POA: Diagnosis not present

## 2020-03-04 DIAGNOSIS — K2950 Unspecified chronic gastritis without bleeding: Secondary | ICD-10-CM | POA: Diagnosis present

## 2020-03-04 DIAGNOSIS — Z91048 Other nonmedicinal substance allergy status: Secondary | ICD-10-CM

## 2020-03-04 DIAGNOSIS — K5900 Constipation, unspecified: Secondary | ICD-10-CM | POA: Diagnosis not present

## 2020-03-04 DIAGNOSIS — I10 Essential (primary) hypertension: Secondary | ICD-10-CM | POA: Diagnosis present

## 2020-03-04 DIAGNOSIS — Z79899 Other long term (current) drug therapy: Secondary | ICD-10-CM

## 2020-03-04 DIAGNOSIS — E871 Hypo-osmolality and hyponatremia: Secondary | ICD-10-CM | POA: Diagnosis present

## 2020-03-04 DIAGNOSIS — R58 Hemorrhage, not elsewhere classified: Secondary | ICD-10-CM | POA: Diagnosis present

## 2020-03-04 DIAGNOSIS — E7800 Pure hypercholesterolemia, unspecified: Secondary | ICD-10-CM | POA: Diagnosis not present

## 2020-03-04 DIAGNOSIS — H532 Diplopia: Secondary | ICD-10-CM | POA: Diagnosis present

## 2020-03-04 DIAGNOSIS — R4701 Aphasia: Secondary | ICD-10-CM | POA: Diagnosis present

## 2020-03-04 DIAGNOSIS — I5022 Chronic systolic (congestive) heart failure: Secondary | ICD-10-CM | POA: Diagnosis present

## 2020-03-04 DIAGNOSIS — D631 Anemia in chronic kidney disease: Secondary | ICD-10-CM | POA: Diagnosis present

## 2020-03-04 DIAGNOSIS — J309 Allergic rhinitis, unspecified: Secondary | ICD-10-CM | POA: Diagnosis not present

## 2020-03-04 DIAGNOSIS — F329 Major depressive disorder, single episode, unspecified: Secondary | ICD-10-CM | POA: Diagnosis present

## 2020-03-04 DIAGNOSIS — G9341 Metabolic encephalopathy: Secondary | ICD-10-CM | POA: Diagnosis present

## 2020-03-04 DIAGNOSIS — K760 Fatty (change of) liver, not elsewhere classified: Secondary | ICD-10-CM | POA: Diagnosis present

## 2020-03-04 DIAGNOSIS — R001 Bradycardia, unspecified: Secondary | ICD-10-CM | POA: Diagnosis present

## 2020-03-04 DIAGNOSIS — K922 Gastrointestinal hemorrhage, unspecified: Secondary | ICD-10-CM | POA: Diagnosis not present

## 2020-03-04 DIAGNOSIS — N189 Chronic kidney disease, unspecified: Secondary | ICD-10-CM | POA: Diagnosis not present

## 2020-03-04 DIAGNOSIS — R4789 Other speech disturbances: Secondary | ICD-10-CM | POA: Diagnosis not present

## 2020-03-04 DIAGNOSIS — Z823 Family history of stroke: Secondary | ICD-10-CM

## 2020-03-04 DIAGNOSIS — E2740 Unspecified adrenocortical insufficiency: Secondary | ICD-10-CM | POA: Diagnosis present

## 2020-03-04 DIAGNOSIS — R7989 Other specified abnormal findings of blood chemistry: Secondary | ICD-10-CM

## 2020-03-04 DIAGNOSIS — Z83438 Family history of other disorder of lipoprotein metabolism and other lipidemia: Secondary | ICD-10-CM

## 2020-03-04 DIAGNOSIS — E875 Hyperkalemia: Secondary | ICD-10-CM | POA: Diagnosis not present

## 2020-03-04 DIAGNOSIS — Z888 Allergy status to other drugs, medicaments and biological substances status: Secondary | ICD-10-CM

## 2020-03-04 DIAGNOSIS — E1142 Type 2 diabetes mellitus with diabetic polyneuropathy: Secondary | ICD-10-CM | POA: Diagnosis present

## 2020-03-04 DIAGNOSIS — I482 Chronic atrial fibrillation, unspecified: Secondary | ICD-10-CM | POA: Diagnosis not present

## 2020-03-04 DIAGNOSIS — E669 Obesity, unspecified: Secondary | ICD-10-CM | POA: Diagnosis present

## 2020-03-04 DIAGNOSIS — M1990 Unspecified osteoarthritis, unspecified site: Secondary | ICD-10-CM | POA: Diagnosis present

## 2020-03-04 DIAGNOSIS — R627 Adult failure to thrive: Secondary | ICD-10-CM | POA: Diagnosis not present

## 2020-03-04 DIAGNOSIS — E1151 Type 2 diabetes mellitus with diabetic peripheral angiopathy without gangrene: Secondary | ICD-10-CM | POA: Diagnosis present

## 2020-03-04 DIAGNOSIS — I428 Other cardiomyopathies: Secondary | ICD-10-CM

## 2020-03-04 DIAGNOSIS — R9431 Abnormal electrocardiogram [ECG] [EKG]: Secondary | ICD-10-CM | POA: Diagnosis not present

## 2020-03-04 DIAGNOSIS — I440 Atrioventricular block, first degree: Secondary | ICD-10-CM | POA: Diagnosis not present

## 2020-03-04 DIAGNOSIS — Z6838 Body mass index (BMI) 38.0-38.9, adult: Secondary | ICD-10-CM

## 2020-03-04 DIAGNOSIS — R945 Abnormal results of liver function studies: Secondary | ICD-10-CM | POA: Diagnosis present

## 2020-03-04 DIAGNOSIS — D509 Iron deficiency anemia, unspecified: Secondary | ICD-10-CM | POA: Diagnosis present

## 2020-03-04 DIAGNOSIS — M25512 Pain in left shoulder: Secondary | ICD-10-CM

## 2020-03-04 DIAGNOSIS — Z91410 Personal history of adult physical and sexual abuse: Secondary | ICD-10-CM

## 2020-03-04 HISTORY — DX: Unspecified fall, initial encounter: W19.XXXA

## 2020-03-04 HISTORY — DX: Paroxysmal atrial fibrillation: I48.0

## 2020-03-04 HISTORY — DX: Repeated falls: R29.6

## 2020-03-04 LAB — COMPREHENSIVE METABOLIC PANEL
ALT: 120 U/L — ABNORMAL HIGH (ref 0–44)
AST: 76 U/L — ABNORMAL HIGH (ref 15–41)
Albumin: 3.7 g/dL (ref 3.5–5.0)
Alkaline Phosphatase: 126 U/L (ref 38–126)
Anion gap: 12 (ref 5–15)
BUN: 12 mg/dL (ref 6–20)
CO2: 28 mmol/L (ref 22–32)
Calcium: 9 mg/dL (ref 8.9–10.3)
Chloride: 95 mmol/L — ABNORMAL LOW (ref 98–111)
Creatinine, Ser: 1.36 mg/dL — ABNORMAL HIGH (ref 0.44–1.00)
GFR calc Af Amer: 50 mL/min — ABNORMAL LOW (ref >60)
GFR calc non Af Amer: 43 mL/min — ABNORMAL LOW (ref >60)
Glucose, Bld: 119 mg/dL — ABNORMAL HIGH (ref 70–99)
Potassium: 4 mmol/L (ref 3.5–5.1)
Sodium: 135 mmol/L (ref 135–145)
Total Bilirubin: 0.7 mg/dL (ref 0.3–1.2)
Total Protein: 6.6 g/dL (ref 6.5–8.1)

## 2020-03-04 LAB — URINALYSIS, ROUTINE W REFLEX MICROSCOPIC
Bilirubin Urine: NEGATIVE
Glucose, UA: NEGATIVE mg/dL
Hgb urine dipstick: NEGATIVE
Ketones, ur: NEGATIVE mg/dL
Leukocytes,Ua: NEGATIVE
Nitrite: NEGATIVE
Protein, ur: NEGATIVE mg/dL
Specific Gravity, Urine: 1.003 — ABNORMAL LOW (ref 1.005–1.030)
pH: 7 (ref 5.0–8.0)

## 2020-03-04 LAB — CBC
HCT: 31 % — ABNORMAL LOW (ref 36.0–46.0)
Hemoglobin: 9.6 g/dL — ABNORMAL LOW (ref 12.0–15.0)
MCH: 30.7 pg (ref 26.0–34.0)
MCHC: 31 g/dL (ref 30.0–36.0)
MCV: 99 fL (ref 80.0–100.0)
Platelets: 315 10*3/uL (ref 150–400)
RBC: 3.13 MIL/uL — ABNORMAL LOW (ref 3.87–5.11)
RDW: 14 % (ref 11.5–15.5)
WBC: 8.4 10*3/uL (ref 4.0–10.5)
nRBC: 0 % (ref 0.0–0.2)

## 2020-03-04 LAB — AMMONIA: Ammonia: 19 umol/L (ref 9–35)

## 2020-03-04 IMAGING — DX DG CHEST 1V PORT
1 series · 1 of 1 positions shown · non-contrast
Comparison: [DATE]

CLINICAL DATA: Speech difficulty

EXAM:
PORTABLE CHEST 1 VIEW

[chest ap]
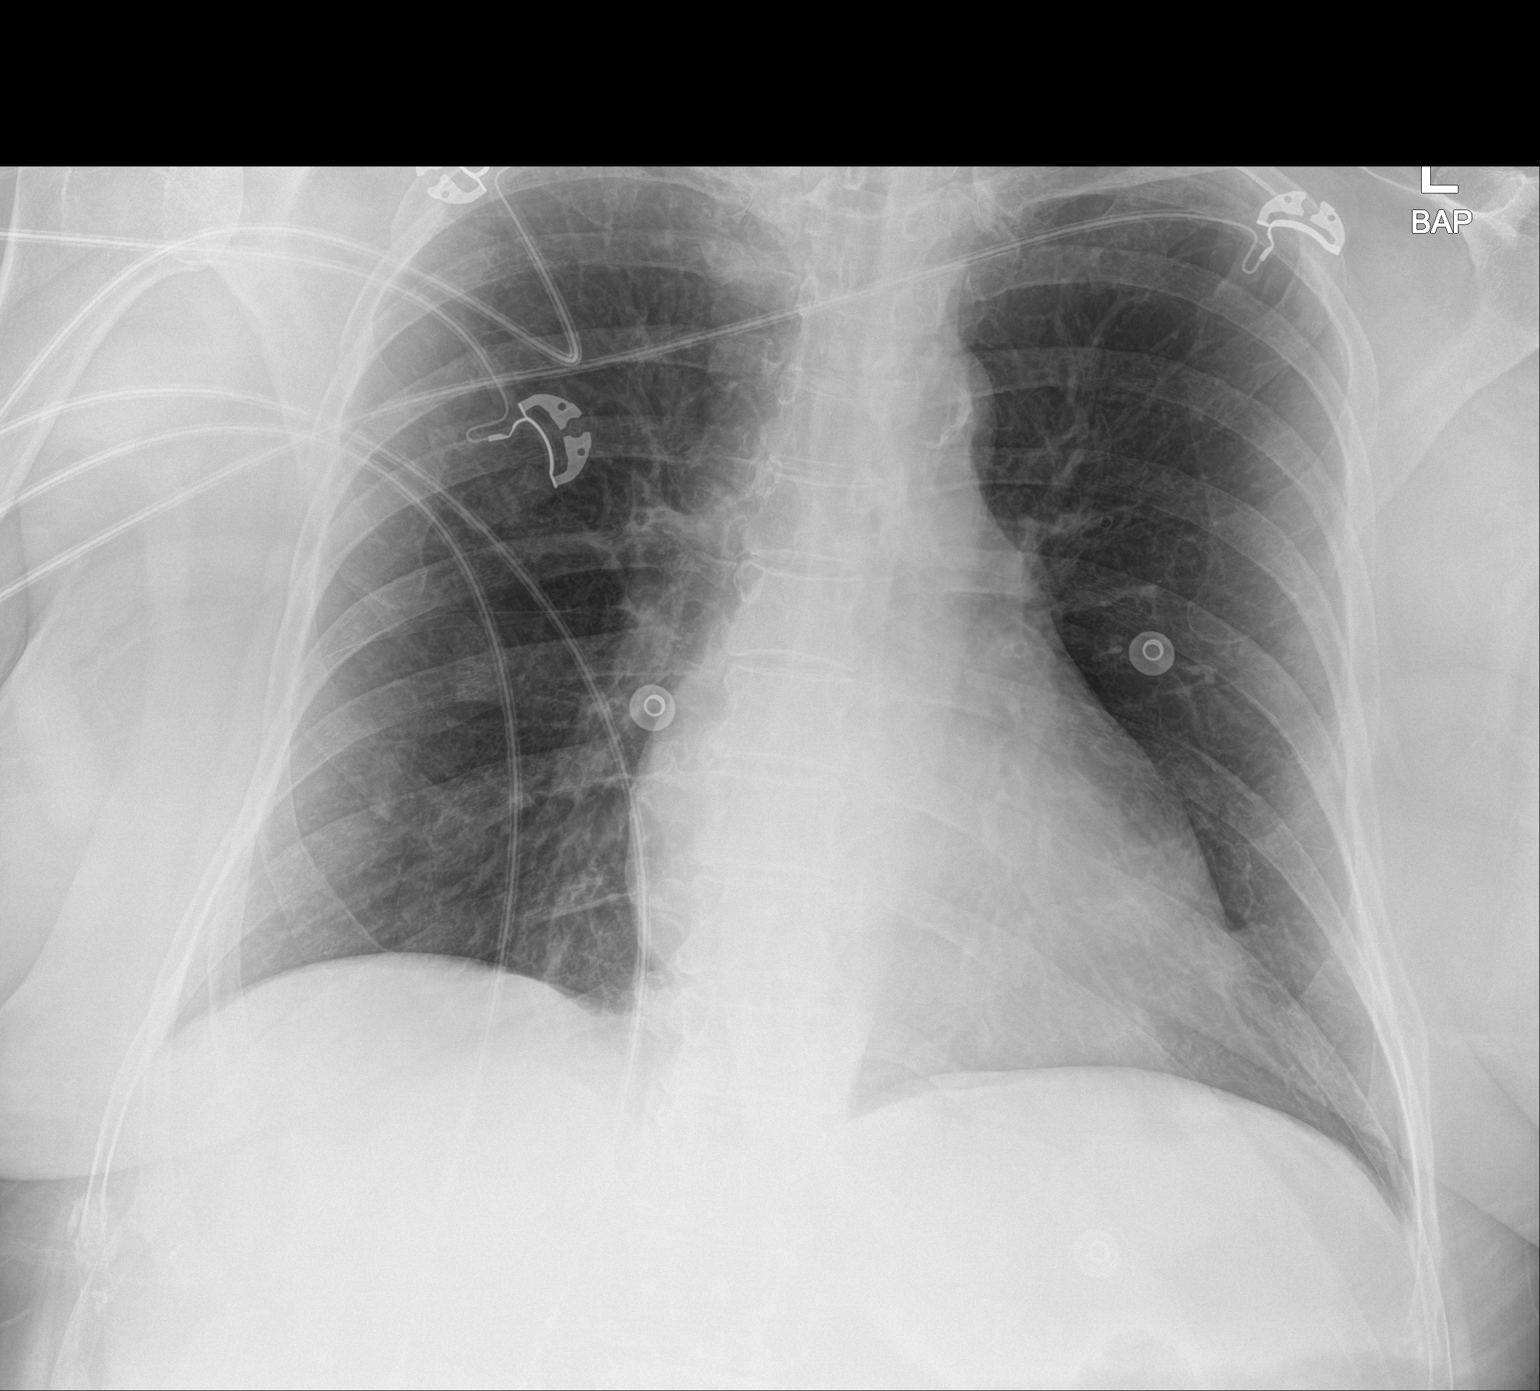

[1 of 1 positions shown; findings below may reference images not displayed]

FINDINGS: The heart size and mediastinal contours are within normal limits.
Both lungs are clear. The visualized skeletal structures are
unremarkable.
IMPRESSION: No active disease.

## 2020-03-04 IMAGING — CT CT HEAD W/O CM
3 series · 14 of 47 positions shown, 16 images · non-contrast
Comparison: [DATE]

CLINICAL DATA: Right leg weakness, numbness, and tingling. Speech
difficulty.

EXAM:
CT HEAD WITHOUT CONTRAST
TECHNIQUE: Contiguous axial images were obtained from the base of the skull
through the vertex without intravenous contrast.

[Series 3: head 5.0 h30s · axial · 0.46mm/px · z∈[-187,-42]mm · 8 of 35 slices shown, 10 images]
[im 3/35  brain]
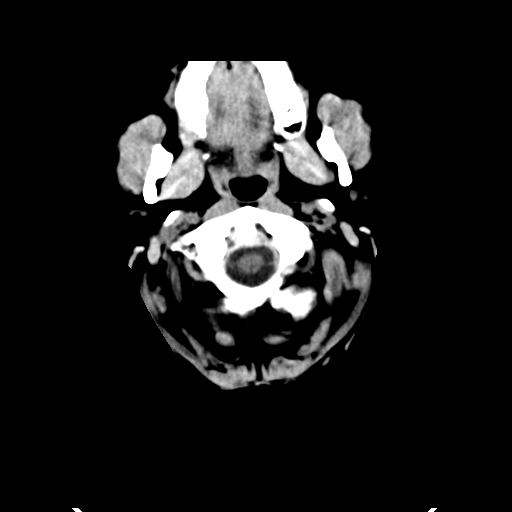
[im 3/35  bone]
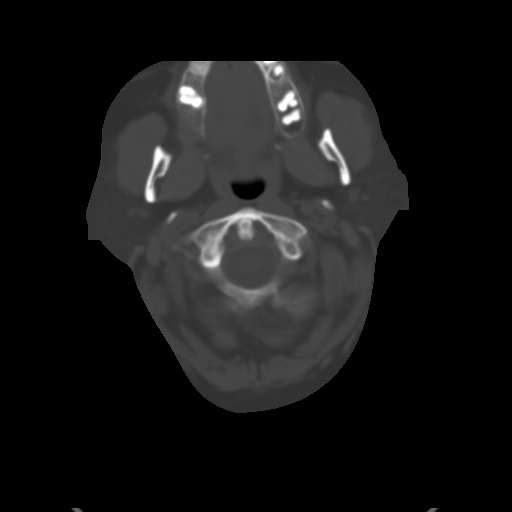
[im 8/35  brain]
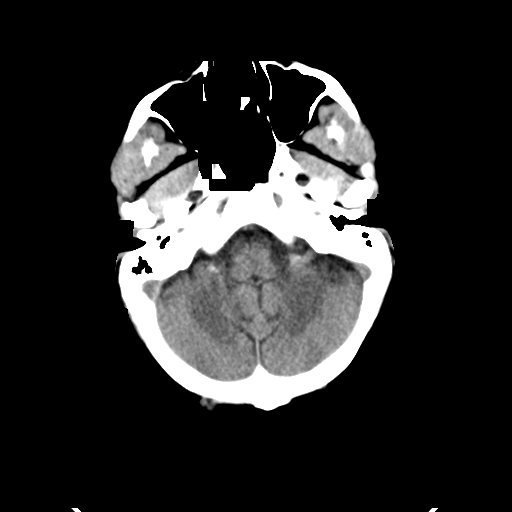
[im 11/35  brain]
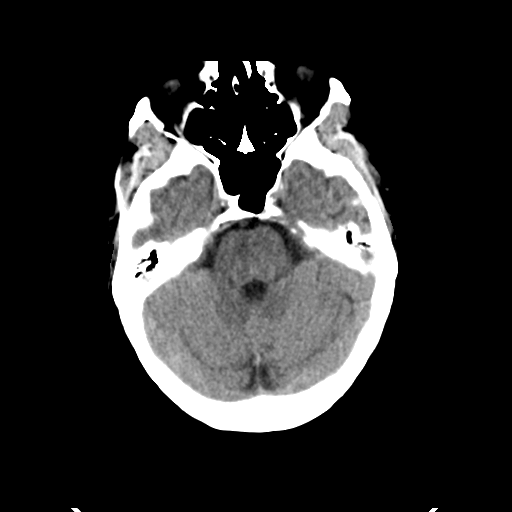
[im 16/35  brain]
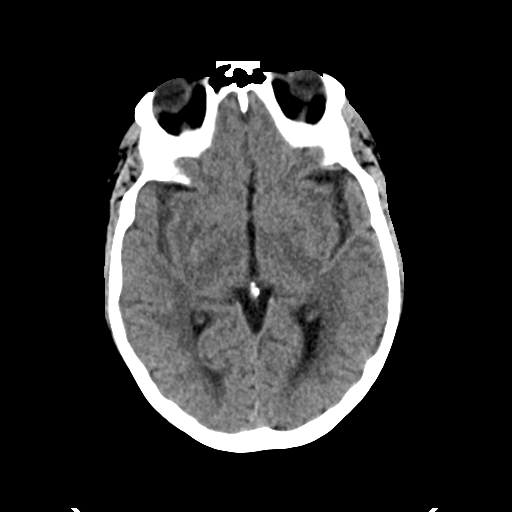
[im 19/35  brain]
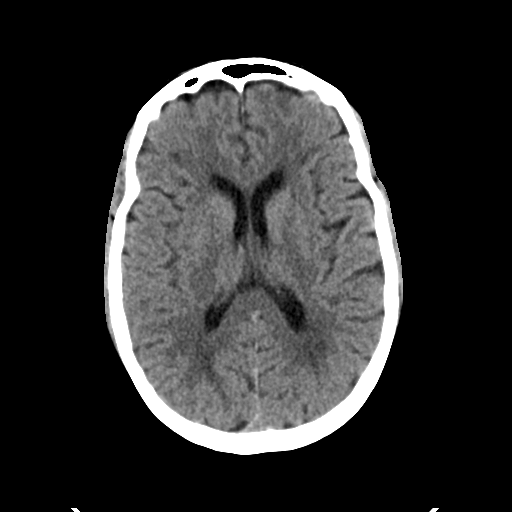
[im 19/35  bone]
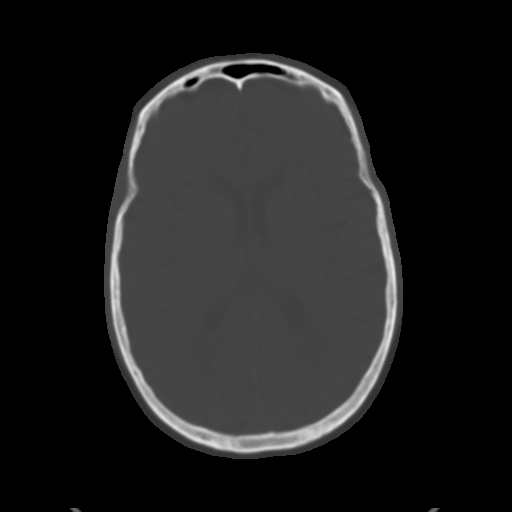
[im 24/35  brain]
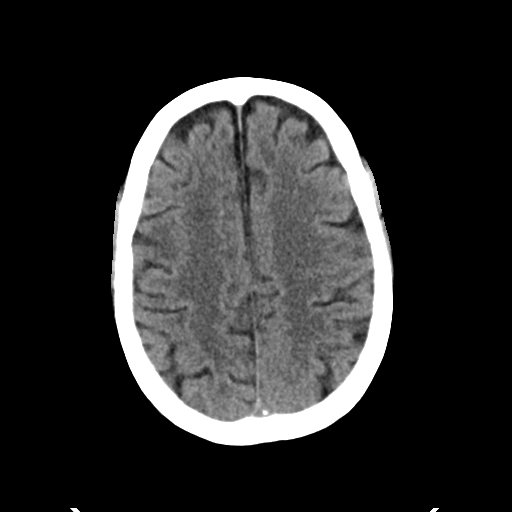
[im 27/35  brain]
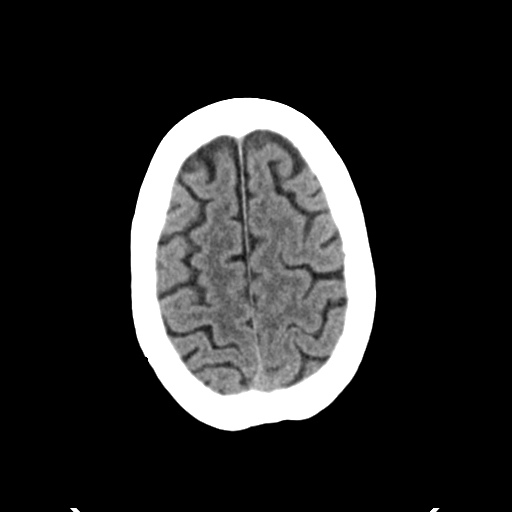
[im 32/35  brain]
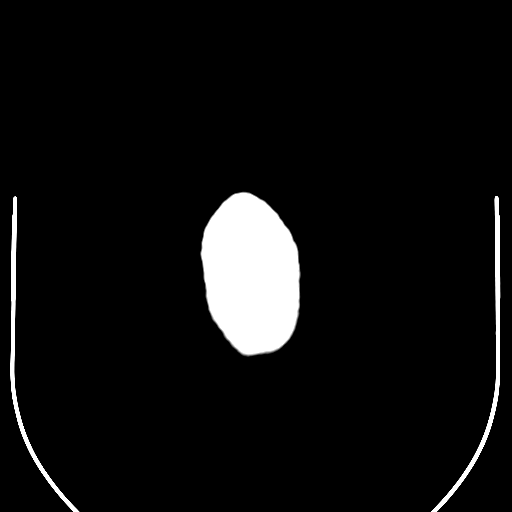

[Series 5: head 3.0 mpr cor · coronal · 0.33mm/px · 3 of 79 slices shown]
[im 27/79  brain]
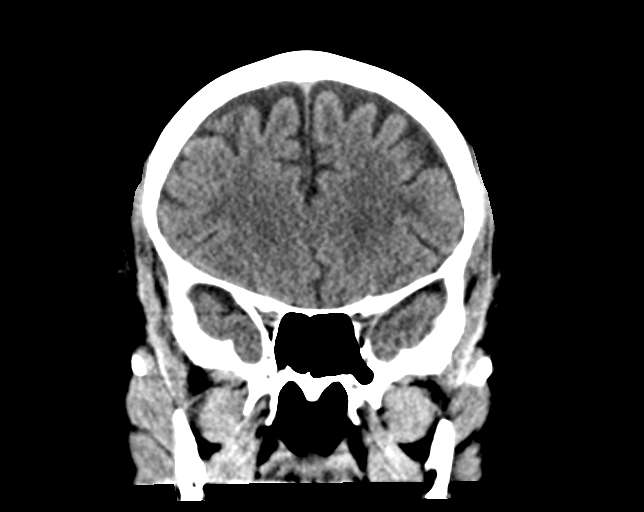
[im 35/79  brain]
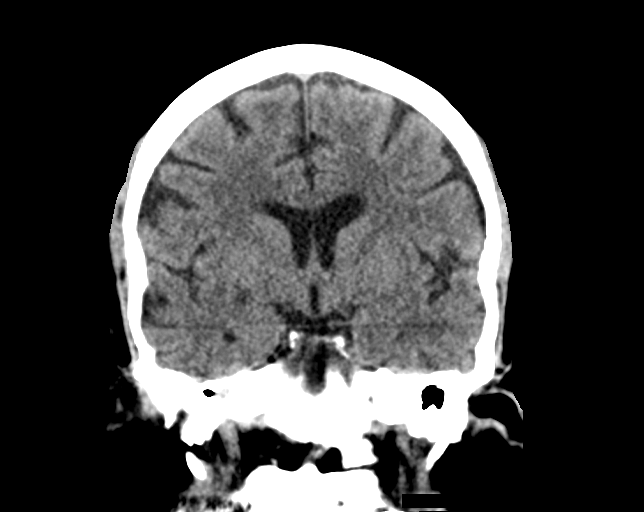
[im 44/79  brain]
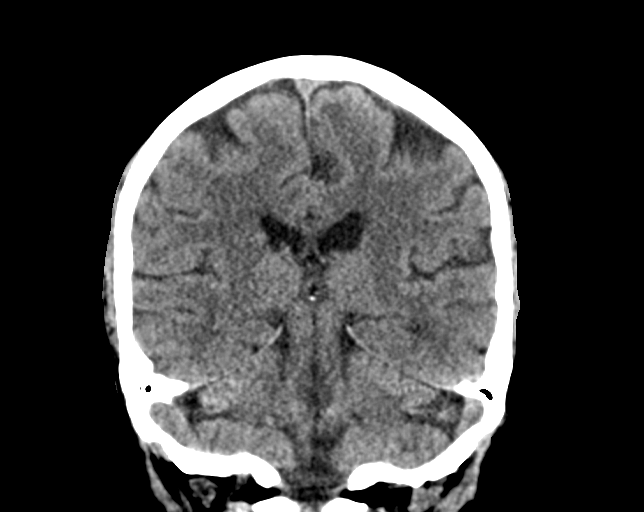

[Series 6: head 3.0 mpr sag · sagittal · 0.33mm/px · 3 of 64 slices shown]
[im 22/64  brain]
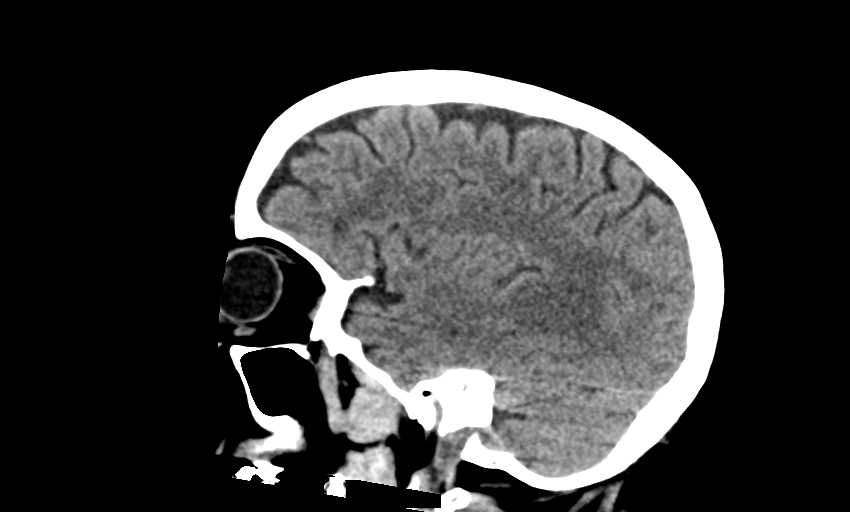
[im 32/64  brain]
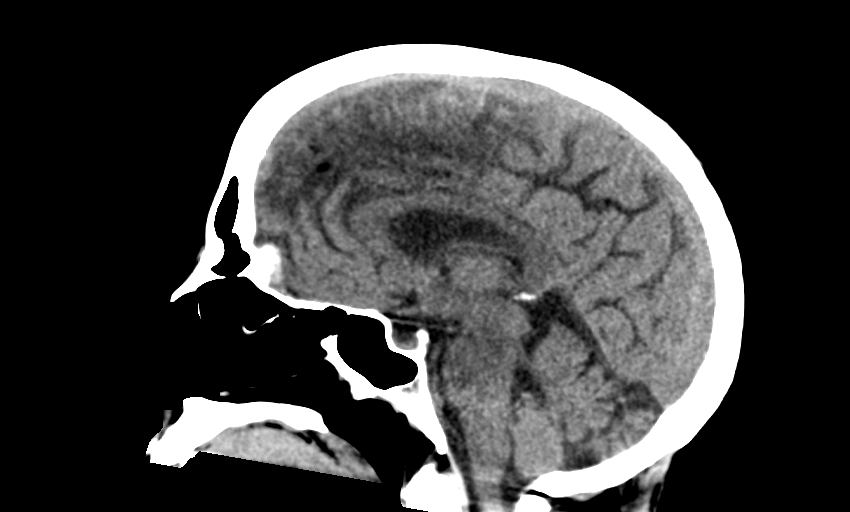
[im 43/64  brain]
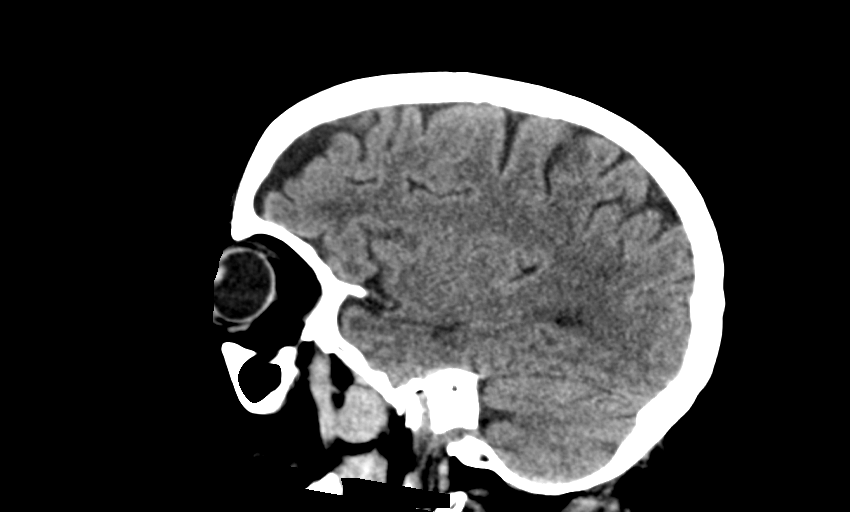

[14 of 47 positions shown; findings below may reference images not displayed]

FINDINGS: Brain: Scattered hypodensities within the periventricular white
matter compatible with stable chronic small vessel ischemic changes.
No signs of acute infarct or hemorrhage. Lateral ventricles and
midline structures are unremarkable. No acute extra-axial fluid
collections. No mass effect.

Vascular: No hyperdense vessel or unexpected calcification.

Skull: Normal. Negative for fracture or focal lesion.

Sinuses/Orbits: No acute finding.

Other: None.
IMPRESSION: 1. Stable head CT, no acute process.

## 2020-03-04 NOTE — ED Provider Notes (Signed)
[facility] EMERGENCY DEPARTMENT Provider Note   CSN: [id number] Arrival date & time: 03/04/20  2104     History Chief Complaint  Patient presents with  . Weakness  . right leg numbness    Brianna Porter is a 59 y.o. female w CHF, Afib on AC, CAD, CKD, DM, HTN, HLD who presents with worsening R sided weakness/sensation, myoclonus, aphasia following seizure like activity 2 days ago.   Patient does endorse more chronic lower extremity weakness nilaterally however states that previously she was able to move her right lower extremity more than now.  Patient also says prior to acute worsening of strength she was able to feed herself with right upper extremity, now she is struggling.  Patient also endorses new paresthesias involving bilateral feet, right hand.  Patient does appear to have some sensation changes previously.  Patient also endorses problems with speech and feeling like she cannot get words out correctly.  Seizure-like activity 3 days ago was witnessed by daughter with possible post ictal state following.  EMS was called at that time however patient refused transport to the ED.  Patient also endorses new double vision in the past couple days.  Patient endorses that approximately 1 month ago she was able to ambulate independently with a walker.  Patient states she has had worsening depression due to inability to take care of herself, do things around the house.  Patient denies fever, infectious symptoms, chest pain, shortness of breath.    The history is provided by the patient and a relative.       Past Medical History:  Diagnosis Date  . Acute gastric ulcer with hemorrhage   . Acute systolic heart failure ([place]) 05/07/2019  . AKI (acute kidney injury) ([place]) 05/07/2019  . Altered mental status 05/07/2019  . Anemia 04/23/2019  . Anxiety and depression   . Atrial fibrillation with rapid ventricular response ([place]) 04/29/2019  . Atrial flutter ([place])   . CAD (coronary  artery disease)   . Chronic systolic CHF (congestive heart failure) (HCC)    a. dx in setting of atrial fib/flutter - possibly tachy mediated. Coronary CTA with only mild CAD in 04/2019.  Marland Kitchen CKD (chronic kidney disease) stage 3, GFR 30-59 ml/min 04/23/2019  . CKD (chronic kidney disease), stage II   . Confusion    a. persistent confusion during 04/2019 admission of unclear cause. Home meds adjusted. CT/MRI brain nonacute.  . Depression 03/02/2019  . Diabetes ([place])   . Edema   . Elevated liver function tests   . Essential hypertension   . Fall 12/23/2019  . Gastrointestinal hemorrhage   . Hypercholesteremia   . Hyperkalemia   . Hyperlipidemia 04/23/2019  . Hypertension   . Hypertensive heart and chronic kidney disease with systolic congestive heart failure ([place])   . Hypertensive heart disease 03/02/2019  . Hyperthyroidism   . Hypokalemia   . Hypokalemia   . Hypomagnesemia   . Hyponatremia   . Iron deficiency anemia   . Mild CAD    a. Coronary CT 04/2019 - Minimal, Non-obstructive CAD.  Marland Kitchen NICM (nonischemic cardiomyopathy) ([place])   . Osteoarthritis 03/02/2019  . Persistent atrial fibrillation ([place])   . Pressure injury of skin 07/09/2019  . Prolonged Q-T interval on ECG 04/30/2019  . Prolonged QT interval   . Symptomatic anemia 06/28/2019  . Type 2 diabetes mellitus with complication, without long-term current use of insulin ([place]) 03/02/2019    Patient Active Problem List   Diagnosis Date Noted  .  Encephalopathy 03/05/2020  . Fall 12/23/2019  . Gastrointestinal hemorrhage   . Pressure injury of skin 07/09/2019  . Acute gastric ulcer with hemorrhage   . Symptomatic anemia 06/28/2019  . Elevated liver function tests   . NICM (nonischemic cardiomyopathy) ([place])   . Mild CAD   . Confusion   . Diabetes ([place])   . Iron deficiency anemia   . Hyperthyroidism   . Altered mental status 05/07/2019  . Acute systolic heart failure ([place]) 05/07/2019  . AKI (acute kidney injury) ([place]) 05/07/2019  .  Hyponatremia   . CAD (coronary artery disease)   . Essential hypertension   . Hypokalemia   . Prolonged Q-T interval on ECG 04/30/2019  . Atrial fibrillation with rapid ventricular response ([place]) 04/29/2019  . Persistent atrial fibrillation ([place])   . Hyperlipidemia 04/23/2019  . Anemia 04/23/2019  . CKD (chronic kidney disease) stage 3, GFR 30-59 ml/min 04/23/2019  . Depression 03/02/2019  . Osteoarthritis 03/02/2019  . Type 2 diabetes mellitus with complication, without long-term current use of insulin ([place]) 03/02/2019  . Hypertensive heart disease 03/02/2019    Past Surgical History:  Procedure Laterality Date  . BIOPSY  06/29/2019   Procedure: BIOPSY;  Surgeon: Irene Shipper, MD;  Location: [place];  Service: Endoscopy;;  . CARDIOVERSION N/A 04/29/2019   Procedure: CARDIOVERSION;  Surgeon: Sanda Klein, MD;  Location: [place];  Service: Cardiovascular;  Laterality: N/A;  . CARDIOVERSION N/A 05/04/2019   Procedure: CARDIOVERSION;  Surgeon: Josue Hector, MD;  Location: [place];  Service: Cardiovascular;  Laterality: N/A;  . COLONOSCOPY WITH PROPOFOL N/A 07/12/2019   Procedure: COLONOSCOPY WITH PROPOFOL;  Surgeon: Doran Stabler, MD;  Location: [place];  Service: Gastroenterology;  Laterality: N/A;  . ENTEROSCOPY N/A 07/10/2019   Procedure: ENTEROSCOPY;  Surgeon: Doran Stabler, MD;  Location: [place];  Service: Gastroenterology;  Laterality: N/A;  . ESOPHAGOGASTRODUODENOSCOPY  06/29/2019  . ESOPHAGOGASTRODUODENOSCOPY (EGD) WITH PROPOFOL N/A 06/29/2019   Procedure: ESOPHAGOGASTRODUODENOSCOPY (EGD) WITH PROPOFOL;  Surgeon: Irene Shipper, MD;  Location: [facility] ENDOSCOPY;  Service: Endoscopy;  Laterality: N/A;  . GIVENS CAPSULE STUDY N/A 07/12/2019   Procedure: GIVENS CAPSULE STUDY;  Surgeon: Doran Stabler, MD;  Location: [place];  Service: Gastroenterology;  Laterality: N/A;  . HEMOSTASIS CLIP PLACEMENT  07/12/2019   Procedure: HEMOSTASIS CLIP  PLACEMENT;  Surgeon: Doran Stabler, MD;  Location: [place];  Service: Gastroenterology;;  . POLYPECTOMY  07/12/2019   Procedure: POLYPECTOMY;  Surgeon: Doran Stabler, MD;  Location: [place];  Service: Gastroenterology;;  . SMALL BOWEL ENTEROSCOPY  07/10/2019  . SUBMUCOSAL TATTOO INJECTION  07/10/2019   Procedure: SUBMUCOSAL TATTOO INJECTION;  Surgeon: Doran Stabler, MD;  Location: [facility] ENDOSCOPY;  Service: Gastroenterology;;  . TEE WITHOUT CARDIOVERSION  04/29/2019  . TEE WITHOUT CARDIOVERSION N/A 04/29/2019   Procedure: TRANSESOPHAGEAL ECHOCARDIOGRAM (TEE);  Surgeon: Sanda Klein, MD;  Location: [facility] ENDOSCOPY;  Service: Cardiovascular;  Laterality: N/A;  . TUBAL LIGATION       OB History   No obstetric history on file.     Family History  Problem Relation Age of Onset  . Cancer Mother   . Heart disease Mother   . Hypercholesterolemia Mother   . Osteoarthritis Mother   . Stroke Mother   . Heart disease Father   . Hypercholesterolemia Brother     Social History   Tobacco Use  . Smoking status: Former Smoker    Packs/day: 2.00  Years: 30.00    Pack years: 60.00    Types: Cigarettes    Quit date: 2001    Years since quitting: 20.5  . Smokeless tobacco: Never Used  Vaping Use  . Vaping Use: Never used  Substance Use Topics  . Alcohol use: Never  . Drug use: Never    Home Medications Prior to Admission medications   Medication Sig Start Date End Date Taking? Authorizing Provider  acetaminophen (TYLENOL) 325 MG tablet Take 650 mg by mouth every 6 (six) hours as needed for mild pain.   Yes [provider]  albuterol (VENTOLIN HFA) 108 (90 Base) MCG/ACT inhaler Inhale 2 puffs into the lungs every 6 (six) hours as needed for wheezing or shortness of breath.   Yes [provider]  ALPRAZolam Duanne Moron) 0.5 MG tablet Take 0.5 mg by mouth 4 (four) times daily as needed for anxiety or sleep.  02/12/20  Yes [provider]   amiodarone (PACERONE) 200 MG tablet Take 1 tablet (200 mg total) by mouth daily. Patient taking differently: Take 200 mg by mouth 2 (two) times daily.  10/12/19  Yes Camnitz, Ocie Doyne, MD  apixaban (ELIQUIS) 5 MG TABS tablet Take 1 tablet (5 mg total) by mouth 2 (two) times daily. 10/12/19  Yes Camnitz, Ocie Doyne, MD  Ascorbic Acid (VITAMIN C) 1000 MG tablet Take 1,000 mg by mouth daily.   Yes [provider]  buPROPion (WELLBUTRIN XL) 150 MG 24 hr tablet Take 150 mg by mouth daily.   Yes [provider]  cetirizine (ZYRTEC) 10 MG tablet Take 10 mg by mouth daily as needed for allergies.   Yes [provider]  cholecalciferol (VITAMIN D3) 25 MCG (1000 UNIT) tablet Take 1,000 Units by mouth daily.   Yes [provider]  DULoxetine (CYMBALTA) 60 MG capsule Take 60 mg by mouth 2 (two) times daily. 02/26/19  Yes [provider]  fenofibrate (TRICOR) 48 MG tablet Take 48 mg by mouth at bedtime.   Yes [provider]  ferrous sulfate 325 (65 FE) MG tablet Take 325 mg by mouth daily.   Yes [provider]  fluconazole (DIFLUCAN) 100 MG tablet Take 100 mg by mouth daily. 02/12/20  Yes [provider]  furosemide (LASIX) 40 MG tablet Take 1 tablet by mouth twice daily Patient taking differently: Take 40 mg by mouth daily.  01/08/20  Yes Richardo Priest, MD  gabapentin (NEURONTIN) 400 MG capsule Take 400 mg by mouth 2 (two) times daily.  03/02/20  Yes [provider]  meclizine (ANTIVERT) 12.5 MG tablet Take 1 tablet (12.5 mg total) by mouth 3 (three) times daily as needed for dizziness. 12/29/19  Yes Antonieta Pert, MD  Menthol, Topical Analgesic, (ICY HOT ADVANCED RELIEF EX) Apply 1 application topically 3 (three) times daily as needed (pain).    Yes [provider]  metoprolol succinate (TOPROL-XL) 100 MG 24 hr tablet Take 1 tablet (100 mg total) by mouth daily. Take with or immediately following a meal. 10/12/19  Yes  Camnitz, Will Hassell Done, MD  pantoprazole (PROTONIX) 40 MG tablet Take 40 mg by mouth 2 (two) times daily.  10/15/19  Yes [provider]  polyethylene glycol (MIRALAX / GLYCOLAX) 17 g packet Take 17 g by mouth at bedtime as needed for moderate constipation.   Yes [provider]  potassium chloride SA (KLOR-CON) 20 MEQ tablet Take 1 tablet (20 mEq total) by mouth 2 (two) times daily. 02/23/20  Yes Dorie Rank,  MD  promethazine (PHENERGAN) 25 MG tablet Take 25 mg by mouth every 6 (six) hours as needed for nausea or vomiting.  02/12/20  Yes [provider]  tetrahydrozoline-zinc (VISINE-AC) 0.05-0.25 % ophthalmic solution Place 2 drops into both eyes 3 (three) times daily as needed (dry eyes).   Yes [provider]  traMADol (ULTRAM) 50 MG tablet Take 50 mg by mouth 4 (four) times daily as needed for moderate pain or severe pain.  02/12/20  Yes [provider]  losartan (COZAAR) 50 MG tablet Take 50 mg by mouth daily. Patient not taking: Reported on 03/04/2020    [provider]    Allergies    Ambien [zolpidem tartrate], Adhesive [tape], and Codeine  Review of Systems   Review of Systems  Constitutional: Negative for chills and fever.  HENT: Negative for congestion, rhinorrhea and sore throat.   Eyes: Positive for visual disturbance. Negative for redness.       Patient endorses new double vision.  Cardiovascular: Negative for chest pain and leg swelling.  Gastrointestinal: Negative for abdominal pain, nausea and vomiting.  Genitourinary: Negative for dysuria and hematuria.  Musculoskeletal: Positive for arthralgias and gait problem.       Patient with left shoulder pain following prior fall.  States it feels like it is clicking.  Skin: Positive for color change and wound. Negative for rash.       Patient with multiple skin tears/lacerations, bruising in setting of AC.  Neurological: Positive for tremors, seizures, speech difficulty, weakness and  numbness. Negative for dizziness, syncope and facial asymmetry.  Psychiatric/Behavioral: Positive for dysphoric mood.    Physical Exam Updated Vital Signs BP 126/68   Pulse (!) 56   Temp 98.2 F (36.8 C) (Oral)   Resp 20   Ht 5\' 2"  (1.575 m)   Wt 65.8 kg   LMP  (LMP Unknown) Comment: ?10 years ago?  SpO2 100%   BMI 26.52 kg/m   Physical Exam Vitals and nursing note reviewed.  Constitutional:      General: She is not in acute distress.    Appearance: She is ill-appearing. She is not toxic-appearing or diaphoretic.  HENT:     Head: Normocephalic and atraumatic.     Mouth/Throat:     Mouth: Mucous membranes are moist.  Eyes:     Extraocular Movements: Extraocular movements intact.     Comments: Patient endorses double vision with leftward gaze  Cardiovascular:     Rate and Rhythm: Normal rate and regular rhythm.     Heart sounds: Normal heart sounds. No murmur heard.   Pulmonary:     Breath sounds: Normal breath sounds. No wheezing or rales.  Abdominal:     Palpations: Abdomen is soft.     Tenderness: There is no abdominal tenderness. There is no guarding or rebound.  Musculoskeletal:        General: No swelling or deformity.     Right lower leg: No edema.     Left lower leg: No edema.  Skin:    General: Skin is warm.     Findings: Abrasion, bruising, laceration and wound present. No rash.     Comments: Patient with multiple areas of bruising. Skin tears/laceration related to anticoagulation use.  Neurological:     Mental Status: She is alert. She is confused.     GCS: GCS eye subscore is 4. GCS verbal subscore is 4. GCS motor subscore is 6.     Cranial Nerves: Cranial nerves are intact.  No facial asymmetry.     Sensory: Sensory deficit present.     Motor: Weakness and tremor present.     Comments: Patient appears a little confused about timeline of symptoms.  Patient with decreased sensation to bilateral lower extremities, right upper extremity.  Patient with  weakness to bilateral lower extremities however right more weak than left.  Patient with 0/5 RLE strength, is unable to hold against gravity.  Patient with LUE strength limited by pain at shoulder, RUE weakness noted.  Psychiatric:        Mood and Affect: Mood is depressed.     ED Results / Procedures / Treatments   Labs (all labs ordered are listed, but only abnormal results are displayed) Labs Reviewed  COMPREHENSIVE METABOLIC PANEL - Abnormal; Notable for the following components:      Result Value   Chloride 95 (*)    Glucose, Bld 119 (*)    Creatinine, Ser 1.36 (*)    AST 76 (*)    ALT 120 (*)    GFR calc non Af Amer 43 (*)    GFR calc Af Amer 50 (*)    All other components within normal limits  URINALYSIS, ROUTINE W REFLEX MICROSCOPIC - Abnormal; Notable for the following components:   Color, Urine STRAW (*)    Specific Gravity, Urine 1.003 (*)    All other components within normal limits  RAPID URINE DRUG SCREEN, HOSP PERFORMED - Abnormal; Notable for the following components:   Benzodiazepines POSITIVE (*)    All other components within normal limits  CBC - Abnormal; Notable for the following components:   RBC 3.13 (*)    Hemoglobin 9.6 (*)    HCT 31.0 (*)    All other components within normal limits  TSH - Abnormal; Notable for the following components:   TSH 73.817 (*)    All other components within normal limits  SARS CORONAVIRUS 2 BY RT PCR (HOSPITAL ORDER, [facility])  URINE CULTURE  AMMONIA  VITAMIN B12  FOLATE  RPR  HIV ANTIBODY (ROUTINE TESTING W REFLEX)  HEPATITIS PANEL, ACUTE  PORPHOBILINOGEN, RANDOM URINE  PORPHYRINS, FRACTIONATED URINE (TIMED COLLECTION)    EKG EKG Interpretation  Date/Time:  Friday March 04 2020 23:46:50 EDT Ventricular Rate:  53 PR Interval:    QRS Duration: 120 QT Interval:  585 QTC Calculation: 550 R Axis:   92 Text Interpretation: Sinus rhythm Nonspecific intraventricular conduction delay No  significant change since last tracing Confirmed by Gareth Morgan [phone]) on 03/05/2020 12:13:59 AM   Radiology EEG  Result Date: 03/05/2020 Lora Havens, MD     03/05/2020  9:09 AM Patient Name: Tavaria Mackins MRN: [id number] Epilepsy Attending: Lora Havens Referring Physician/Provider: Dr Mitzi Hansen Date: 03/05/2020 Duration: 27.29 mins Patient history: 59yo F presenting wiyh paresthesias and intermittent limb jerking. EEG to evaluate for seizure Level of alertness: Awake, asleep AEDs during EEG study: Gabapentin Technical aspects: This EEG study was done with scalp electrodes positioned according to the 10-20 International system of electrode placement. Electrical activity was acquired at a sampling rate of 500Hz  and reviewed with a high frequency filter of 70Hz  and a low frequency filter of 1Hz . EEG data were recorded continuously and digitally stored. Description: The posterior dominant rhythm consists of 10 Hz activity of moderate voltage (25-35 uV) seen predominantly in posterior head regions, symmetric and reactive to eye opening and eye closing. Sleep was characterized by vertex waves, sleep spindles (12 to 14 Hz),  maximal frontocentral region. Hyperventilation did not show any EEG change. Photic stimulation was not performed.   IMPRESSION: This study is within normal limits. No seizures or epileptiform discharges were seen throughout the recording. Lora Havens   CT Head Wo Contrast  Result Date: 03/04/2020 CLINICAL DATA:  Right leg weakness, numbness, and tingling. Speech difficulty. EXAM: CT HEAD WITHOUT CONTRAST TECHNIQUE: Contiguous axial images were obtained from the base of the skull through the vertex without intravenous contrast. COMPARISON:  02/23/2020 FINDINGS: Brain: Scattered hypodensities within the periventricular white matter compatible with stable chronic small vessel ischemic changes. No signs of acute infarct or hemorrhage. Lateral ventricles and midline  structures are unremarkable. No acute extra-axial fluid collections. No mass effect. Vascular: No hyperdense vessel or unexpected calcification. Skull: Normal. Negative for fracture or focal lesion. Sinuses/Orbits: No acute finding. Other: None. IMPRESSION: 1. Stable head CT, no acute process. Electronically Signed   By: Randa Ngo M.D.   On: 03/04/2020 23:35   MR BRAIN WO CONTRAST  Result Date: 03/05/2020 CLINICAL DATA:  Initial evaluation for acute neuro deficit, stroke suspected. EXAM: MRI HEAD WITHOUT CONTRAST TECHNIQUE: Multiplanar, multiecho pulse sequences of the brain and surrounding structures were obtained without intravenous contrast. COMPARISON:  Prior head CT from 03/04/2020. FINDINGS: Brain: Examination moderately degraded by motion artifact. Cerebral volume within normal limits for age. Patchy T2/FLAIR hyperintensity within the periventricular deep white matter both cerebral hemispheres most consistent with chronic small vessel ischemic disease, mild to moderate in nature. No abnormal foci of restricted diffusion to suggest acute or subacute ischemia. Gray-white matter differentiation maintained. No encephalomalacia to suggest chronic cortical infarction. No acute or chronic intracranial hemorrhage. No mass lesion, midline shift or mass effect. No hydrocephalus or extra-axial fluid collection. Pituitary gland suprasellar region within normal limits. Midline structures intact. Vascular: Major intracranial vascular flow voids are maintained. Skull and upper cervical spine: Craniocervical junction within normal limits. Upper cervical spine normal. Bone marrow signal intensity within normal limits. No scalp soft tissue abnormality. Sinuses/Orbits: Globes and orbital soft tissues within normal limits. Paranasal sinuses are clear. Small bilateral mastoid effusions noted, of doubtful significance. Other: None. IMPRESSION: 1. No acute intracranial abnormality. 2. Mild to moderate chronic microvascular  ischemic disease. Electronically Signed   By: Jeannine Boga M.D.   On: 03/05/2020 03:41   MR CERVICAL SPINE WO CONTRAST  Result Date: 03/05/2020 CLINICAL DATA:  Acute onset right leg weakness, numbness and tingling on approximately 03/03/2020. Right worse than left leg weakness. History of multiple falls. Initial encounter. EXAM: MRI CERVICAL SPINE WITHOUT CONTRAST TECHNIQUE: Multiplanar, multisequence MR imaging of the cervical spine was performed. No intravenous contrast was administered. COMPARISON:  Brain MRI this same day. Cervical spine MRI 08/01/2011. FINDINGS: Alignment: The exam is degraded by patient motion. There is mild reversal of the normal cervical lordosis. Vertebrae: No fracture, evidence of discitis, or bone lesion. Cord: Appears normal. Posterior Fossa, vertebral arteries, paraspinal tissues: Negative. Disc levels: C2-3: The right facets appear ankylosed. No disc bulge or protrusion. No stenosis. C3-4: Minimal disc bulge and mild facet arthropathy without stenosis. C4-5: Shallow disc bulge to the right. Shallow right paracentral protrusion seen on the prior examination has regressed. The central canal is open. Very mild bilateral foraminal narrowing. C5-6: Negative. C6-7: Negative. C7-T1: Negative. IMPRESSION: Motion degraded examination demonstrates no finding to explain the patient's symptoms. No new abnormality since the prior MRI. The central canal is widely patent at all levels. Very mild bilateral foraminal narrowing C4-5 is seen. Electronically Signed  By: Inge Rise M.D.   On: 03/05/2020 12:33   MR THORACIC SPINE WO CONTRAST  Result Date: 03/05/2020 CLINICAL DATA:  Acute onset right leg weakness, numbness and tingling on approximately 03/03/2020. Right worse than left leg weakness. History of multiple falls. Initial encounter. EXAM: MRI THORACIC SPINE WITHOUT CONTRAST TECHNIQUE: Multiplanar, multisequence MR imaging of the thoracic spine was performed. No intravenous  contrast was administered. COMPARISON:  None. FINDINGS: Alignment:  Trace anterolisthesis T2 on T3.  Otherwise maintained. Vertebrae: No fracture, evidence of discitis, or bone lesion. Cord: Motion degrades evaluation but no signal abnormality in the cord is seen. Paraspinal and other soft tissues: Negative. Disc levels: The central canal and neural foramina are widely patent at all levels with scattered mild degenerative disc disease most notable at T9-10 where there is a shallow left paracentral protrusion. IMPRESSION: No finding to explain the patient's symptoms. The thoracic spinal cord is normal in appearance. Mild thoracic degenerative change without stenosis. Electronically Signed   By: Inge Rise M.D.   On: 03/05/2020 12:42   MR LUMBAR SPINE WO CONTRAST  Result Date: 03/05/2020 CLINICAL DATA:  Acute onset right leg weakness, numbness and tingling on approximately 03/03/2020. Right worse than left leg weakness. History of multiple falls. Initial encounter. EXAM: MRI LUMBAR SPINE WITHOUT CONTRAST TECHNIQUE: Multiplanar, multisequence MR imaging of the lumbar spine was performed. No intravenous contrast was administered. COMPARISON:  None. FINDINGS: Segmentation:  Standard. Alignment:  Maintained. Vertebrae:  No fracture, evidence of discitis, or bone lesion. Conus medullaris and cauda equina: Conus extends to the L1 level. Conus and cauda equina appear normal. Paraspinal and other soft tissues: Negative. Disc levels: L1-2: Loss of disc space height with a shallow bulge.  No stenosis. L2-3: Loss of disc space height with a bulge and endplate spur more prominent to the left. Mild left foraminal narrowing. The central canal and right foramen are open. L3-4: Disc bulge to the right causes mild narrowing in the right lateral recess. Foramina open. L4-5: Shallow disc bulge without stenosis. L5-S1: Negative. IMPRESSION: No finding to explain the patient's symptoms. Mild lumbar spondylosis as described  above. Electronically Signed   By: Inge Rise M.D.   On: 03/05/2020 12:48   DG Chest Portable 1 View  Result Date: 03/04/2020 CLINICAL DATA:  Speech difficulty EXAM: PORTABLE CHEST 1 VIEW COMPARISON:  12/22/2019 FINDINGS: The heart size and mediastinal contours are within normal limits. Both lungs are clear. The visualized skeletal structures are unremarkable. IMPRESSION: No active disease. Electronically Signed   By: Randa Ngo M.D.   On: 03/04/2020 23:23    Procedures Procedures (including critical care time)  Medications Ordered in ED Medications  amiodarone (PACERONE) tablet 200 mg (200 mg Oral Given 03/05/20 1220)  furosemide (LASIX) tablet 40 mg (40 mg Oral Given 03/05/20 1222)  metoprolol succinate (TOPROL-XL) 24 hr tablet 100 mg (100 mg Oral Given 03/05/20 1230)  ALPRAZolam (XANAX) tablet 0.5 mg (has no administration in time range)  DULoxetine (CYMBALTA) DR capsule 60 mg (60 mg Oral Given 03/05/20 1219)  pantoprazole (PROTONIX) EC tablet 40 mg (40 mg Oral Given 03/05/20 1223)  gabapentin (NEURONTIN) capsule 400 mg (400 mg Oral Given 03/05/20 1220)  albuterol (VENTOLIN HFA) 108 (90 Base) MCG/ACT inhaler 2 puff (has no administration in time range)  sodium chloride flush (NS) 0.9 % injection 3 mL (has no administration in time range)  sodium chloride flush (NS) 0.9 % injection 3 mL (has no administration in time range)  sodium chloride flush (NS)  0.9 % injection 3 mL (has no administration in time range)  0.9 %  sodium chloride infusion (has no administration in time range)  acetaminophen (TYLENOL) tablet 650 mg (has no administration in time range)    Or  acetaminophen (TYLENOL) suppository 650 mg (has no administration in time range)  polyethylene glycol (MIRALAX / GLYCOLAX) packet 17 g (has no administration in time range)  ondansetron (ZOFRAN) tablet 4 mg (has no administration in time range)    Or  ondansetron (ZOFRAN) injection 4 mg (has no administration in time range)     ED Course  I have reviewed the triage vital signs and the nursing notes.  Pertinent labs & imaging results that were available during my care of the patient were reviewed by me and considered in my medical decision making (see chart for details).    MDM Rules/Calculators/A&P                          Medical Decision Making:  Surya Folden is a 59 y.o. female who presented to the ED today with recent seizure-like activity 3 days ago and now with worsening RUE/RLE weakness and sensation changes as well as aphasia, myoclonus and generalized weakness, concerning for a possible CVA. A through neurological exam was performed, and pertinent findings include decreased sensation to bilateral lower extremities, R>L.  Decreased strength of RUE.  Patient with myoclonic-like jerking to all extremities intermittently throughout exam.  Patient appeared to be somewhat confused about timeline of symptoms during exam/history.  Patient afebrile, hemodynamically stable, NAD.  Patient overall chronically ill-appearing.  History of these neurological changes concerning for possibility of multiple processes.  Initial differential broad including infectious causes (meningitis, UTI, PNA), tumor, toxic causes (hypoglycemia, renal failure, hepatic failure), CVA, psychiatric causes. Labs performed and resulted above.  UA without concern for infection.  UDS significant for benzodiazepines, review of her medications reveals prescription for Xanax.  Ammonia 19.  BG 119.  No metabolic abnormalities that could be contributing to symptoms.  No leukocytosis, Hgb stable at 9.6.  CXR completed without evidence PNA, acute cardiopulmonary abnormality.  CT of the head was completed without any new acute intracranial findings.  Due to concern for acute change in sensation/weakness 2 days ago following possible seizure-like activity, concern for multiple processes leading to symptoms neurology was consulted.  Neurology recommended MRI  brain without contrast.  Neurology to evaluate patient at bedside and provide additional recommendations.  Patient care handed off to oncoming team.  Please see their note for rest of ED care time and disposition.    Final Clinical Impression(s) / ED Diagnoses Final diagnoses:  Weakness  Dehydration    Rx / DC Orders ED Discharge Orders    None       Kennyth Lose, MD 03/05/20 1448    Gareth Morgan, MD 03/05/20 1530

## 2020-03-04 NOTE — ED Triage Notes (Signed)
Pt BIB [facility] from home c/o of right leg weakness and numbness and tingling. Pt's LKW was 1am on 03/03/2020. Since then pt has had weakness and numbness in right leg and is now starting to have numbness in the left leg. Pt is also c/o double vision. Pt is c/o 9/10 chronic shoulder pain related to recent falls. Pt denies any other complaints.

## 2020-03-05 ENCOUNTER — Observation Stay (HOSPITAL_COMMUNITY): Payer: Medicare HMO

## 2020-03-05 ENCOUNTER — Encounter (HOSPITAL_COMMUNITY): Payer: Self-pay | Admitting: Family Medicine

## 2020-03-05 ENCOUNTER — Emergency Department (HOSPITAL_COMMUNITY): Payer: Medicare HMO

## 2020-03-05 DIAGNOSIS — I10 Essential (primary) hypertension: Secondary | ICD-10-CM | POA: Diagnosis not present

## 2020-03-05 DIAGNOSIS — I4819 Other persistent atrial fibrillation: Secondary | ICD-10-CM

## 2020-03-05 DIAGNOSIS — M5126 Other intervertebral disc displacement, lumbar region: Secondary | ICD-10-CM | POA: Diagnosis not present

## 2020-03-05 DIAGNOSIS — M4722 Other spondylosis with radiculopathy, cervical region: Secondary | ICD-10-CM | POA: Diagnosis not present

## 2020-03-05 DIAGNOSIS — R296 Repeated falls: Secondary | ICD-10-CM | POA: Diagnosis not present

## 2020-03-05 DIAGNOSIS — I428 Other cardiomyopathies: Secondary | ICD-10-CM | POA: Diagnosis not present

## 2020-03-05 DIAGNOSIS — M4314 Spondylolisthesis, thoracic region: Secondary | ICD-10-CM | POA: Diagnosis not present

## 2020-03-05 DIAGNOSIS — R7989 Other specified abnormal findings of blood chemistry: Secondary | ICD-10-CM | POA: Diagnosis not present

## 2020-03-05 DIAGNOSIS — R569 Unspecified convulsions: Secondary | ICD-10-CM

## 2020-03-05 DIAGNOSIS — G9340 Encephalopathy, unspecified: Secondary | ICD-10-CM | POA: Diagnosis present

## 2020-03-05 DIAGNOSIS — R29818 Other symptoms and signs involving the nervous system: Secondary | ICD-10-CM | POA: Diagnosis not present

## 2020-03-05 DIAGNOSIS — N1832 Chronic kidney disease, stage 3b: Secondary | ICD-10-CM | POA: Diagnosis not present

## 2020-03-05 DIAGNOSIS — M5011 Cervical disc disorder with radiculopathy,  high cervical region: Secondary | ICD-10-CM | POA: Diagnosis not present

## 2020-03-05 DIAGNOSIS — D509 Iron deficiency anemia, unspecified: Secondary | ICD-10-CM

## 2020-03-05 DIAGNOSIS — M5124 Other intervertebral disc displacement, thoracic region: Secondary | ICD-10-CM | POA: Diagnosis not present

## 2020-03-05 DIAGNOSIS — M50121 Cervical disc disorder at C4-C5 level with radiculopathy: Secondary | ICD-10-CM | POA: Diagnosis not present

## 2020-03-05 DIAGNOSIS — R200 Anesthesia of skin: Secondary | ICD-10-CM | POA: Diagnosis not present

## 2020-03-05 DIAGNOSIS — M47814 Spondylosis without myelopathy or radiculopathy, thoracic region: Secondary | ICD-10-CM | POA: Diagnosis not present

## 2020-03-05 DIAGNOSIS — R202 Paresthesia of skin: Secondary | ICD-10-CM | POA: Diagnosis not present

## 2020-03-05 HISTORY — DX: Encephalopathy, unspecified: G93.40

## 2020-03-05 LAB — HEPATITIS PANEL, ACUTE
HCV Ab: NONREACTIVE
Hep A IgM: NONREACTIVE
Hep B C IgM: NONREACTIVE
Hepatitis B Surface Ag: NONREACTIVE

## 2020-03-05 LAB — HIV ANTIBODY (ROUTINE TESTING W REFLEX): HIV Screen 4th Generation wRfx: NONREACTIVE

## 2020-03-05 LAB — RPR: RPR Ser Ql: NONREACTIVE

## 2020-03-05 LAB — SARS CORONAVIRUS 2 BY RT PCR (HOSPITAL ORDER, PERFORMED IN ~~LOC~~ HOSPITAL LAB): SARS Coronavirus 2: NEGATIVE

## 2020-03-05 LAB — TSH: TSH: 73.817 u[IU]/mL — ABNORMAL HIGH (ref 0.350–4.500)

## 2020-03-05 LAB — RAPID URINE DRUG SCREEN, HOSP PERFORMED
Amphetamines: NOT DETECTED
Barbiturates: NOT DETECTED
Benzodiazepines: POSITIVE — AB
Cocaine: NOT DETECTED
Opiates: NOT DETECTED
Tetrahydrocannabinol: NOT DETECTED

## 2020-03-05 LAB — VITAMIN B12: Vitamin B-12: 706 pg/mL (ref 180–914)

## 2020-03-05 LAB — FOLATE: Folate: 11.3 ng/mL (ref >5.9)

## 2020-03-05 IMAGING — MR MR THORACIC SPINE W/O CM
4 of 8 series · 21 of 48 positions shown · non-contrast
Comparison: None.

CLINICAL DATA: Acute onset right leg weakness, numbness and
tingling on approximately [DATE]. Right worse than left leg
weakness. History of multiple falls. Initial encounter.

EXAM:
MRI THORACIC SPINE WITHOUT CONTRAST
TECHNIQUE: Multiplanar, multisequence MR imaging of the thoracic spine was
performed. No intravenous contrast was administered.

[Series 20: T1 · sagittal · 3.0mm · 0.62mm/px · 4 of 9 slices shown (1 of 2)]
[im 1/9]
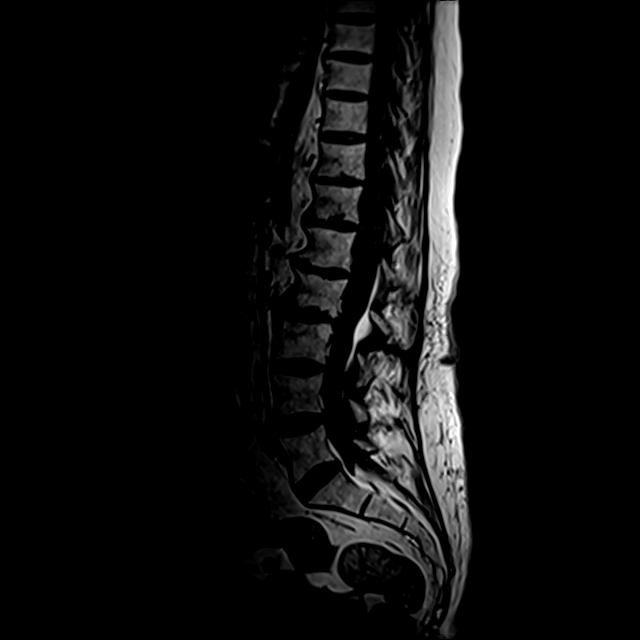
[im 3/9]
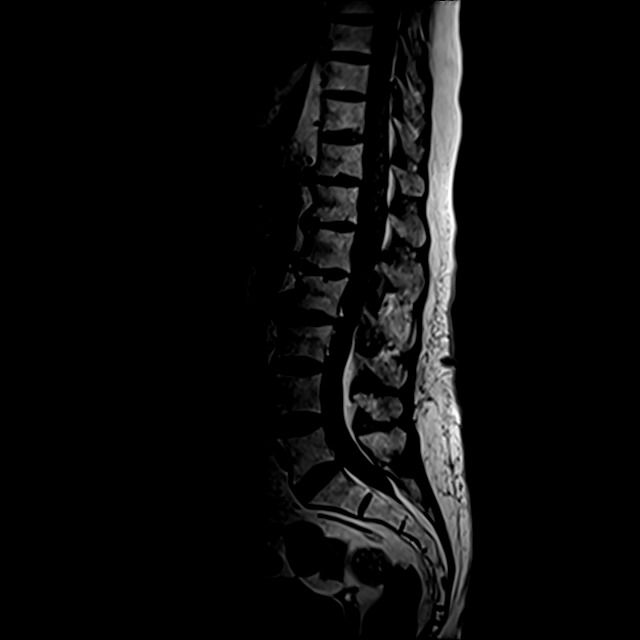
[im 6/9]
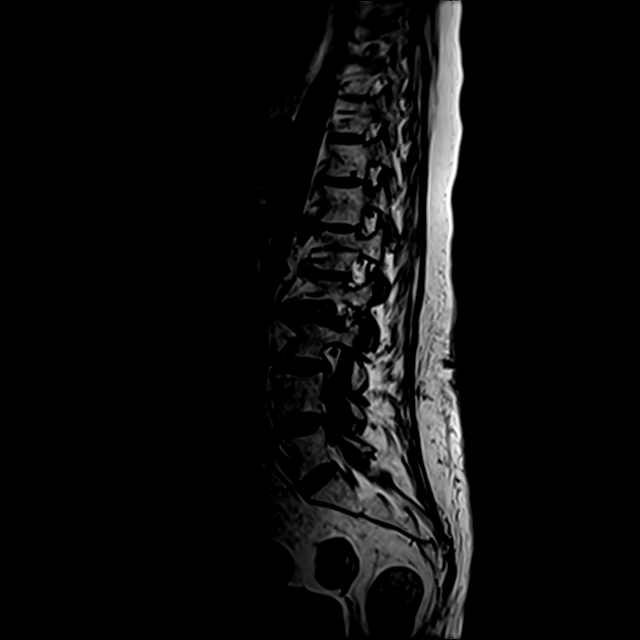
[im 9/9]
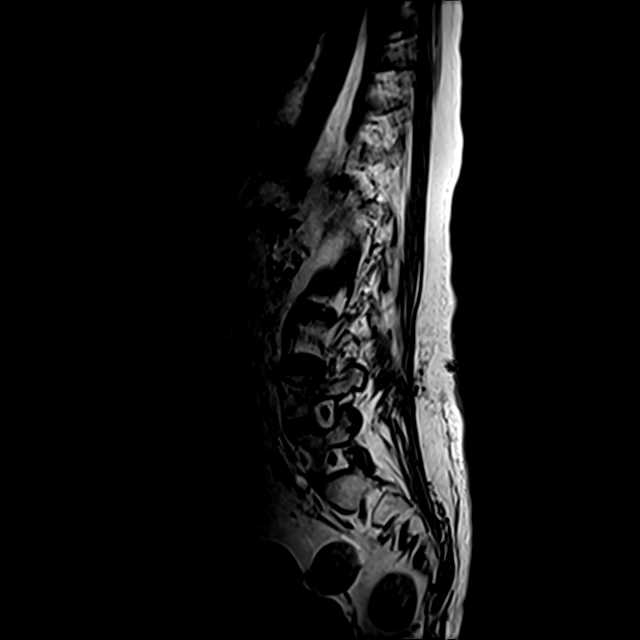

[Series 21: T1 · sagittal · 3.0mm · 0.62mm/px · 3 of 9 slices shown (2 of 2)]
[im 1/9]
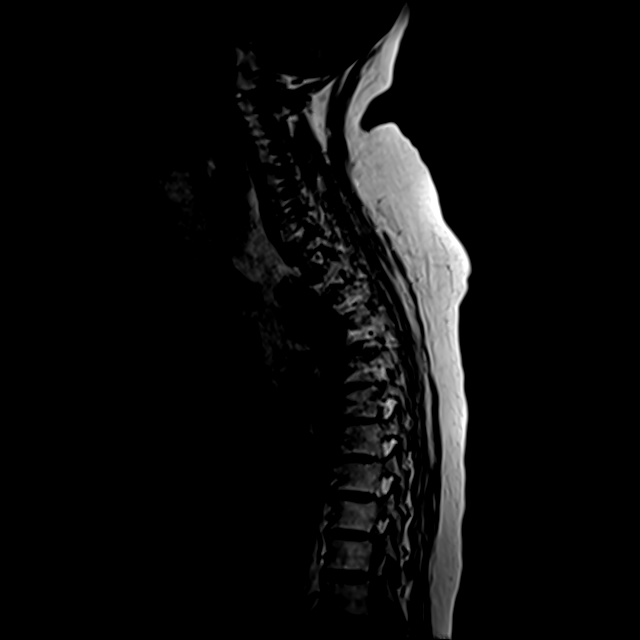
[im 5/9]
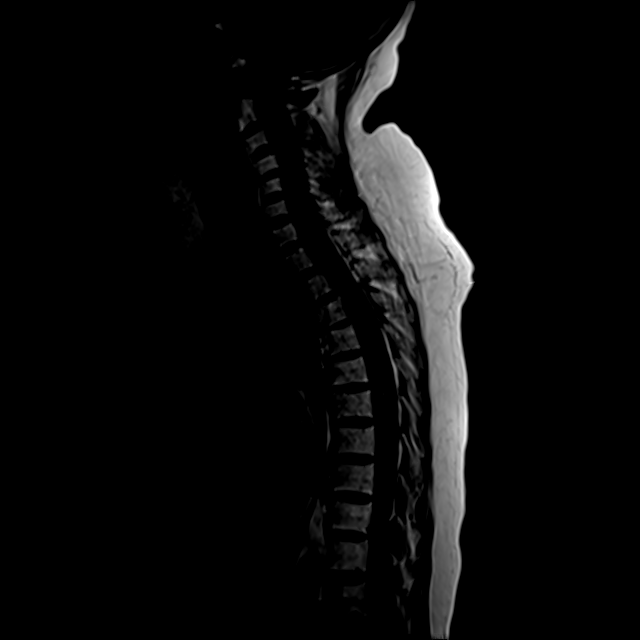
[im 9/9]
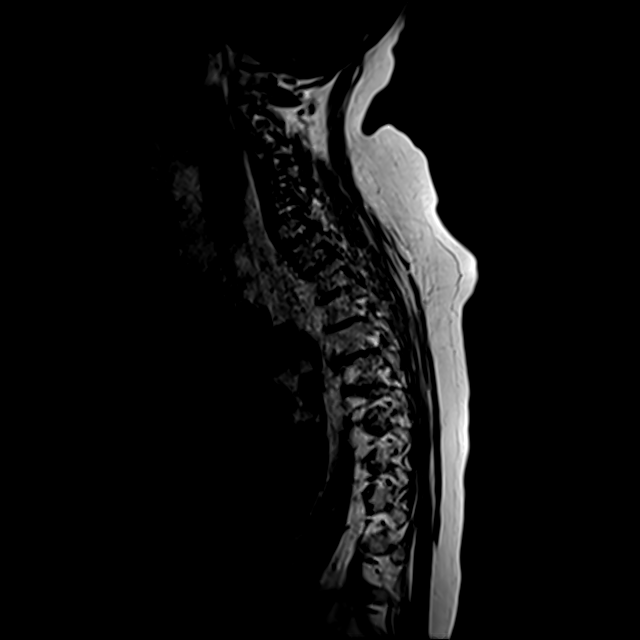

[Series 24: T2 · sagittal · 3.0mm · 0.76mm/px · 5 of 17 slices shown (1 of 2)]
[im 1/17]
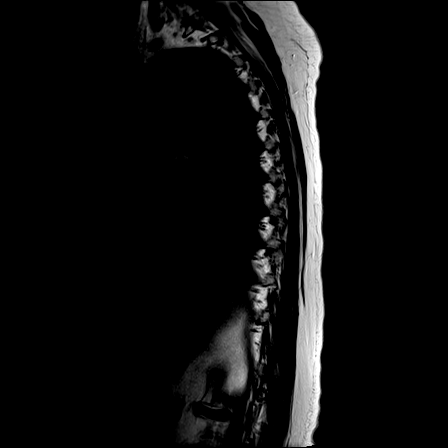
[im 5/17]
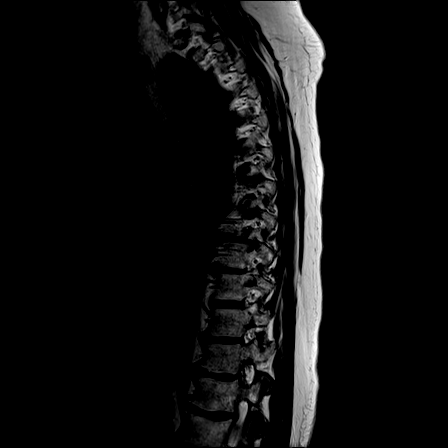
[im 9/17]
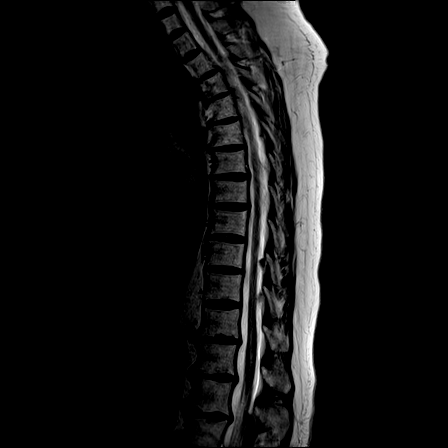
[im 13/17]
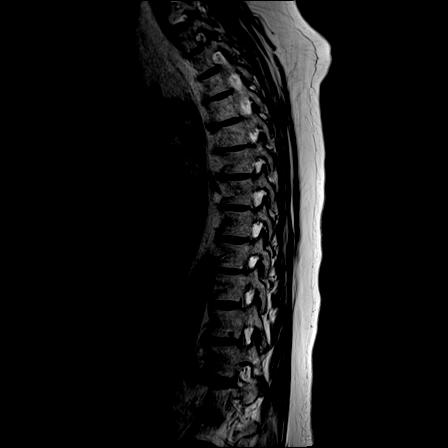
[im 17/17]
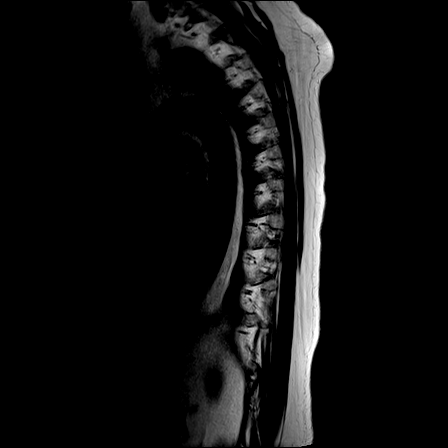

[Series 27: T2 · axial · 5.0mm · 0.59mm/px · z∈[-336,-121]mm · 9 of 39 slices shown (2 of 2)]
[im 1/39]
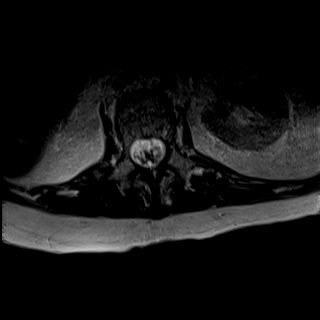
[im 7/39]
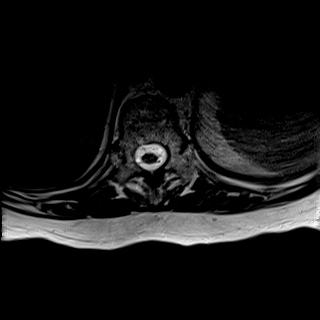
[im 11/39]
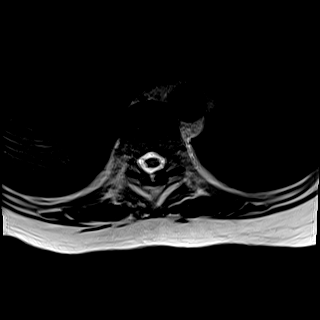
[im 18/39]
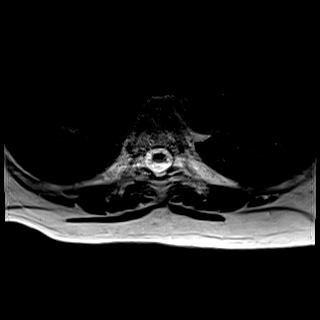
[im 21/39]
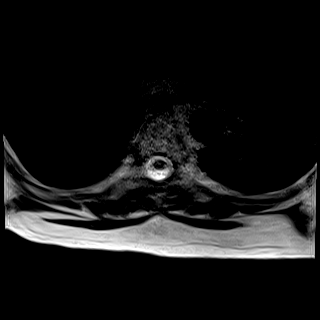
[im 28/39]
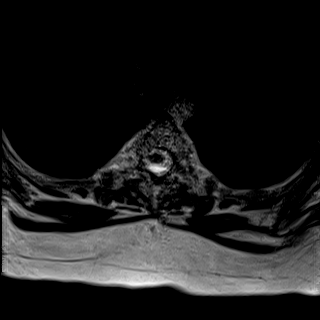
[im 32/39]
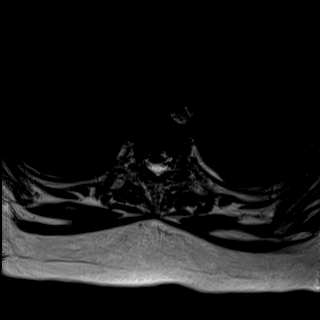
[im 35/39]
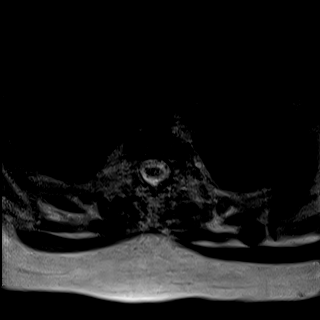
[im 39/39]
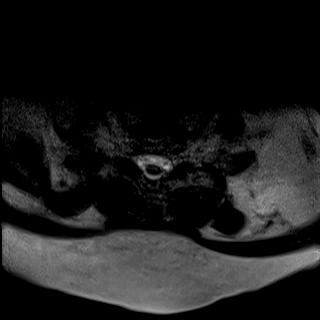

[21 of 48 positions shown; findings below may reference images not displayed]

FINDINGS: Alignment:  Trace anterolisthesis T2 on T3.  Otherwise maintained.

Vertebrae: No fracture, evidence of discitis, or bone lesion.

Cord: Motion degrades evaluation but no signal abnormality in the
cord is seen.

Paraspinal and other soft tissues: Negative.

Disc levels:

The central canal and neural foramina are widely patent at all
levels with scattered mild degenerative disc disease most notable at
T9-10 where there is a shallow left paracentral protrusion.
IMPRESSION: No finding to explain the patient's symptoms. The thoracic spinal
cord is normal in appearance.

Mild thoracic degenerative change without stenosis.

## 2020-03-05 IMAGING — MR MR CERVICAL SPINE W/O CM
6 series · 39 of 48 positions shown · non-contrast
Comparison: Brain MRI this same day. Cervical spine MRI [DATE].

CLINICAL DATA: Acute onset right leg weakness, numbness and
tingling on approximately [DATE]. Right worse than left leg
weakness. History of multiple falls. Initial encounter.

EXAM:
MRI CERVICAL SPINE WITHOUT CONTRAST
TECHNIQUE: Multiplanar, multisequence MR imaging of the cervical spine was
performed. No intravenous contrast was administered.

[Series 16: T2 · sagittal · 3.0mm · 0.69mm/px · 6 of 15 slices shown (1 of 2)]
[im 1/15]
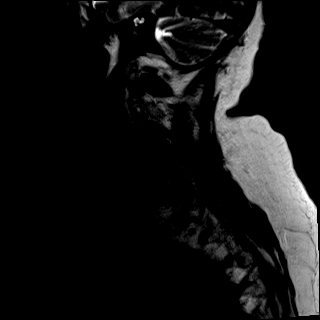
[im 3/15]
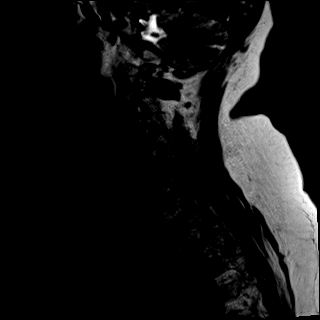
[im 6/15]
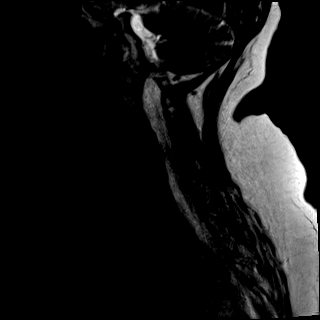
[im 9/15]
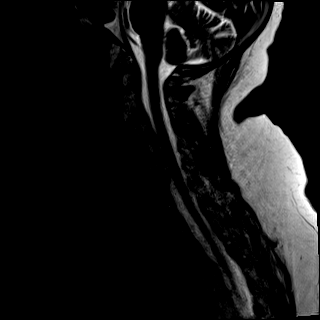
[im 12/15]
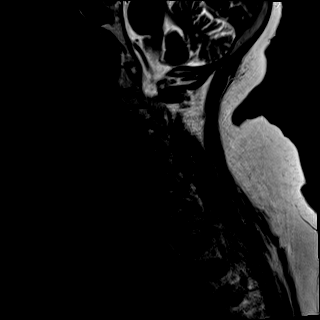
[im 15/15]
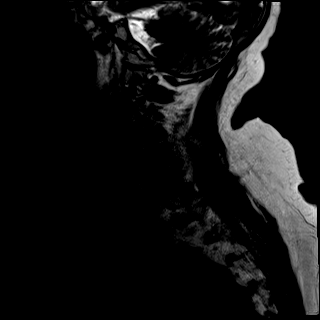

[Series 17: T1 · sagittal · 3.0mm · 0.69mm/px · 6 of 15 slices shown]
[im 1/15]
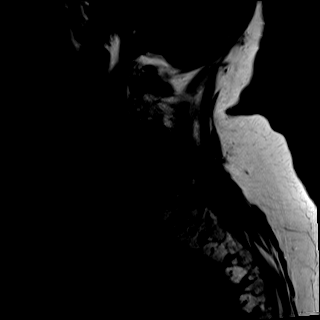
[im 3/15]
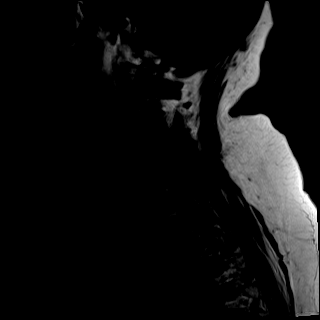
[im 6/15]
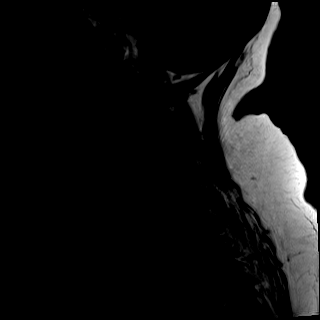
[im 9/15]
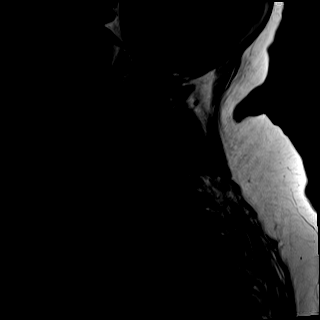
[im 12/15]
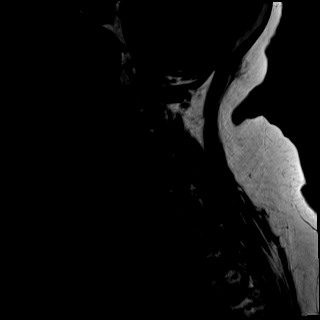
[im 15/15]
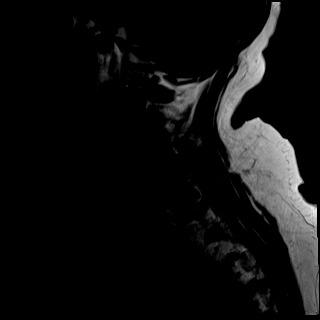

[Series 19: T2 · axial · 3.0mm · 0.66mm/px · z∈[-125,-10]mm · 9 of 38 slices shown (2 of 2)]
[im 1/38]
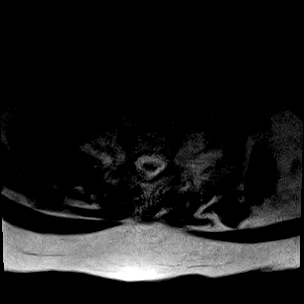
[im 7/38]
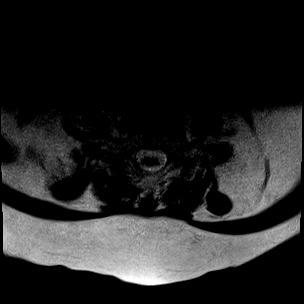
[im 13/38]
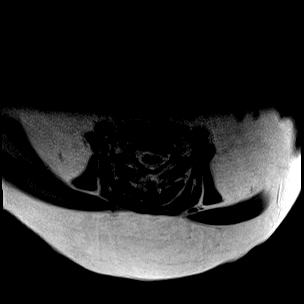
[im 16/38]
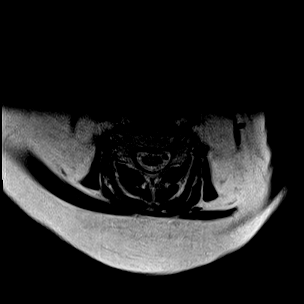
[im 19/38]
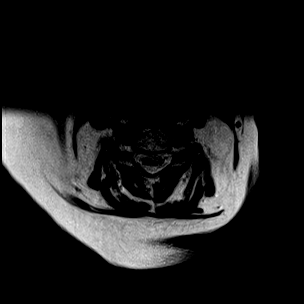
[im 22/38]
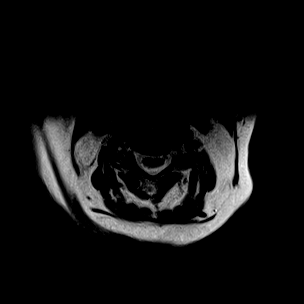
[im 25/38]
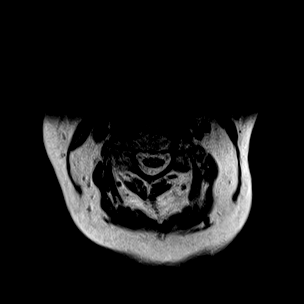
[im 31/38]
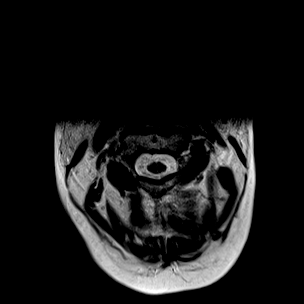
[im 38/38]
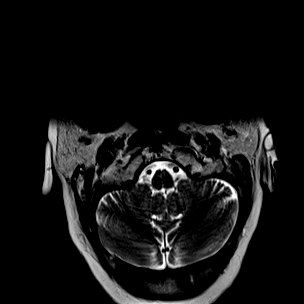

[Series 20: GRE · axial · 3.0mm · 0.35mm/px · z∈[-121,-6]mm · 8 of 38 slices shown]
[im 1/38]
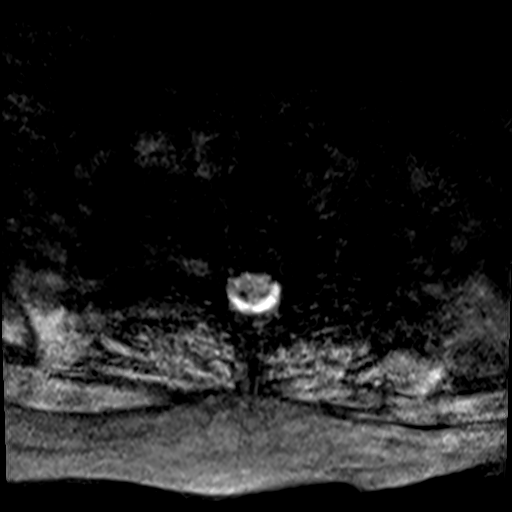
[im 7/38]
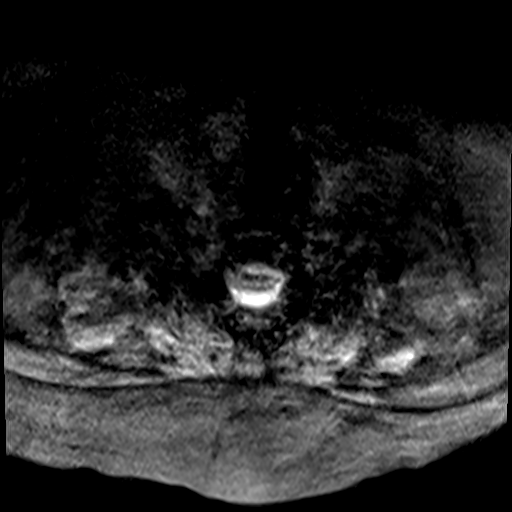
[im 13/38]
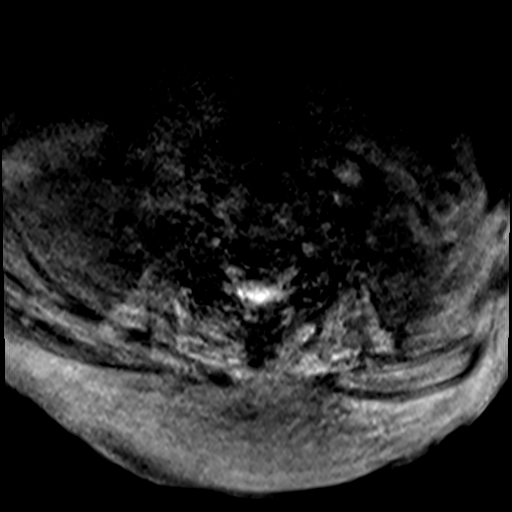
[im 16/38]
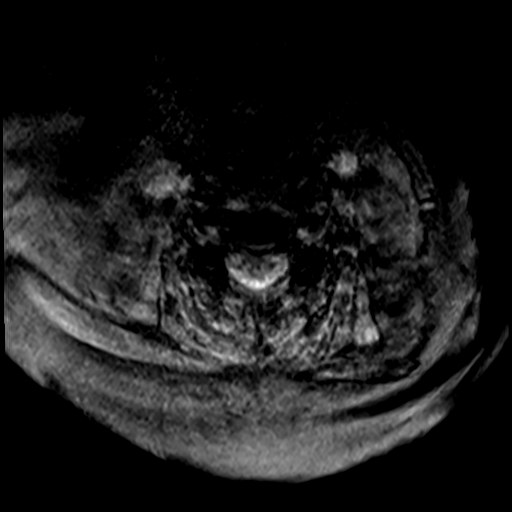
[im 22/38]
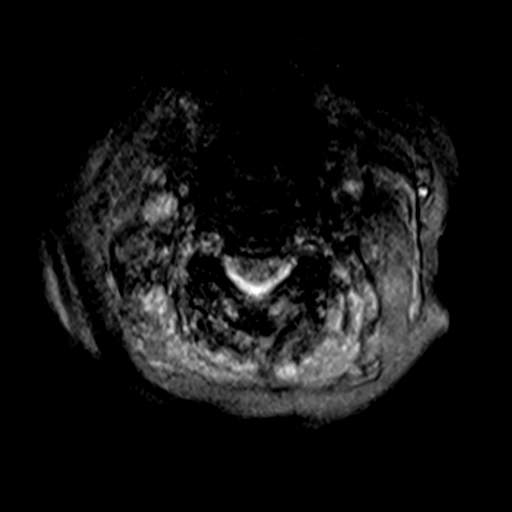
[im 25/38]
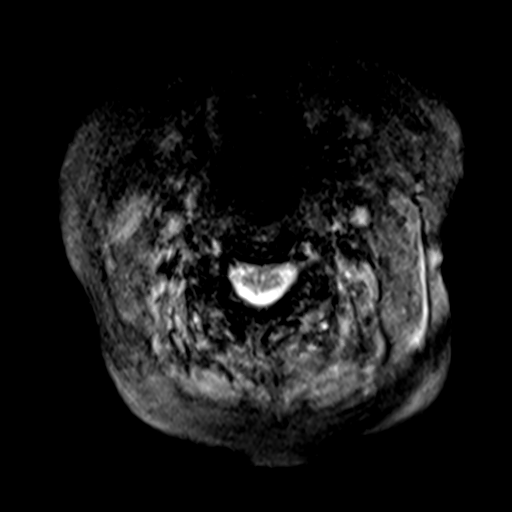
[im 31/38]
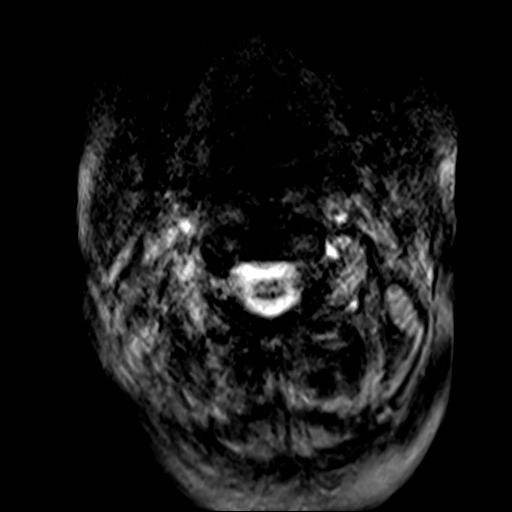
[im 38/38]
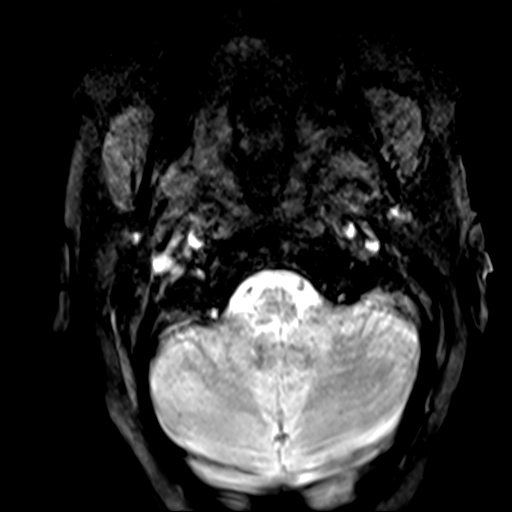

[Series 21: STIR · sagittal · 3.0mm · 0.86mm/px · 5 of 15 slices shown (1 of 2)]
[im 1/15]
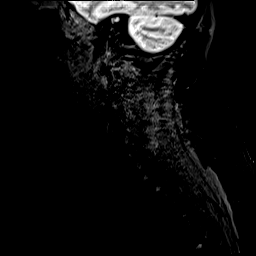
[im 4/15]
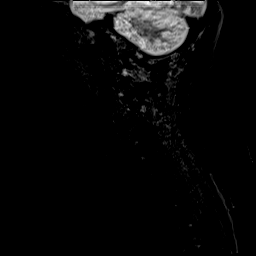
[im 8/15]
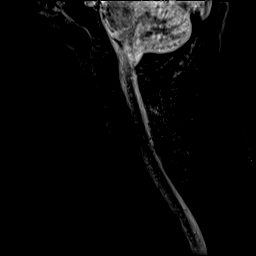
[im 11/15]
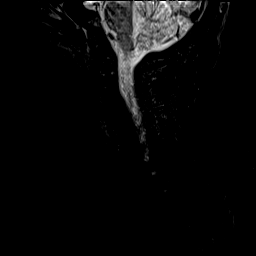
[im 15/15]
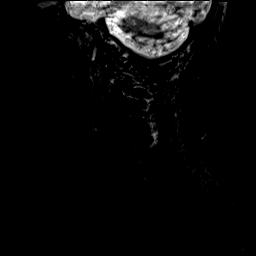

[Series 25: STIR · sagittal · 3.0mm · 0.86mm/px · 5 of 15 slices shown (2 of 2)]
[im 1/15]
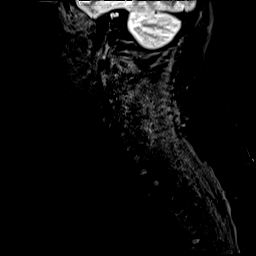
[im 4/15]
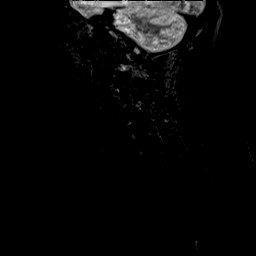
[im 8/15]
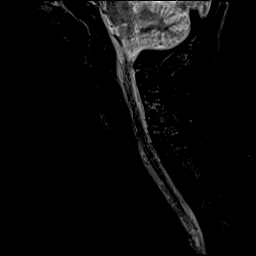
[im 11/15]
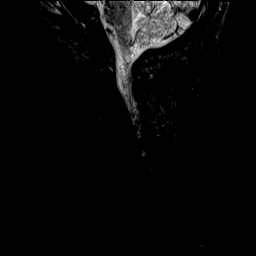
[im 15/15]
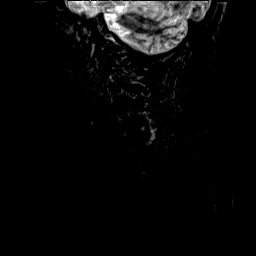

[39 of 48 positions shown; findings below may reference images not displayed]

FINDINGS: Alignment: The exam is degraded by patient motion. There is mild
reversal of the normal cervical lordosis.

Vertebrae: No fracture, evidence of discitis, or bone lesion.

Cord: Appears normal.

Posterior Fossa, vertebral arteries, paraspinal tissues: Negative.

Disc levels:

C2-3: The right facets appear ankylosed. No disc bulge or
protrusion. No stenosis.

C3-4: Minimal disc bulge and mild facet arthropathy without
stenosis.

C4-5: Shallow disc bulge to the right. Shallow right paracentral
protrusion seen on the prior examination has regressed. The central
canal is open. Very mild bilateral foraminal narrowing.

C5-6: Negative.

C6-7: Negative.

C7-T1: Negative.
IMPRESSION: Motion degraded examination demonstrates no finding to explain the
patient's symptoms. No new abnormality since the prior MRI.

The central canal is widely patent at all levels. Very mild
bilateral foraminal narrowing C4-5 is seen.

## 2020-03-05 IMAGING — MR MR HEAD W/O CM
14 series · 48 of 48 positions shown · non-contrast
Comparison: Prior head CT from [DATE].

CLINICAL DATA: Initial evaluation for acute neuro deficit, stroke
suspected.

EXAM:
MRI HEAD WITHOUT CONTRAST
TECHNIQUE: Multiplanar, multiecho pulse sequences of the brain and surrounding
structures were obtained without intravenous contrast.

[Series 5: DWI · axial · 3.0mm · 0.88mm/px · z∈[-68,+68]mm · 8 of 96 slices shown (1 of 4)]
[im 1/96]
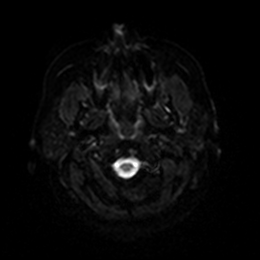
[im 14/96]
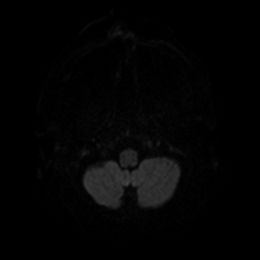
[im 28/96]
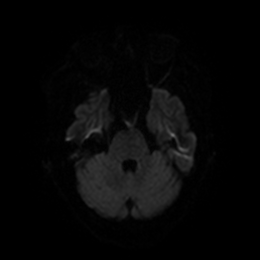
[im 41/96]
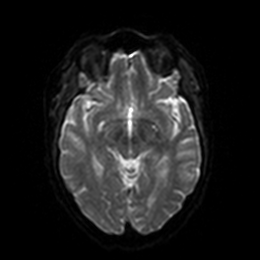
[im 55/96]
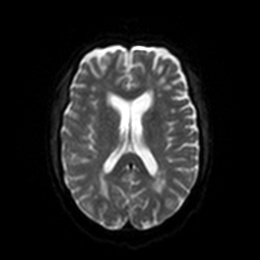
[im 68/96]
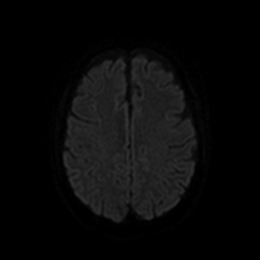
[im 82/96]
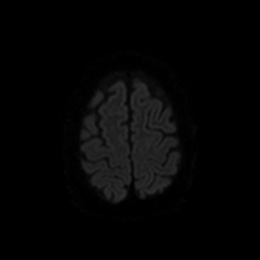
[im 96/96]
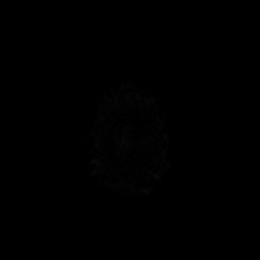

[Series 6: DWI · axial · 3.0mm · 0.88mm/px · z∈[-68,+68]mm · 4 of 48 slices shown (2 of 4)]
[im 1/48]
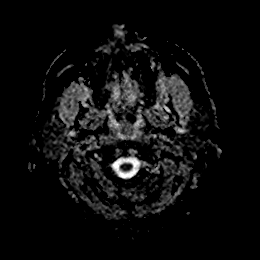
[im 16/48]
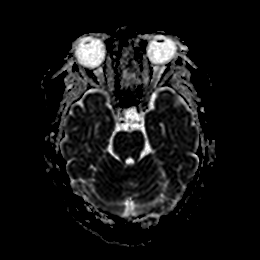
[im 32/48]
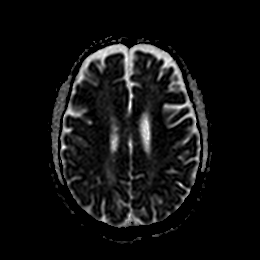
[im 48/48]
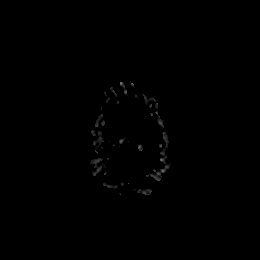

[Series 7: DWI · coronal · 4.0mm · 0.88mm/px · 5 of 66 slices shown (3 of 4)]
[im 1/66]
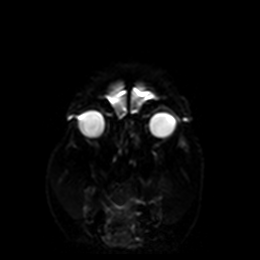
[im 17/66]
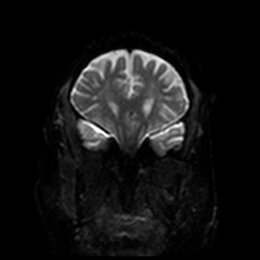
[im 33/66]
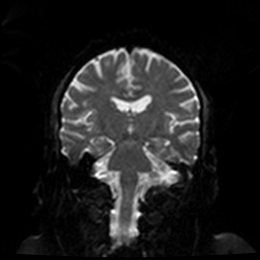
[im 49/66]
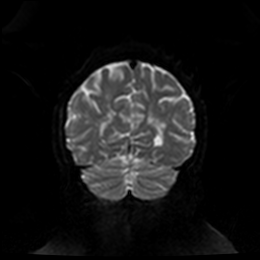
[im 66/66]
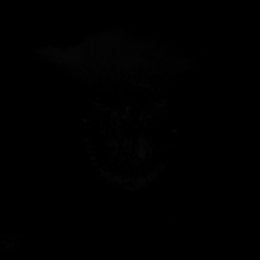

[Series 8: DWI · coronal · 4.0mm · 0.88mm/px · 3 of 33 slices shown (4 of 4)]
[im 1/33]
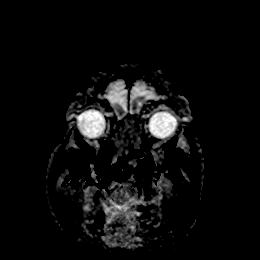
[im 17/33]
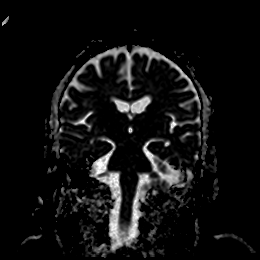
[im 33/33]
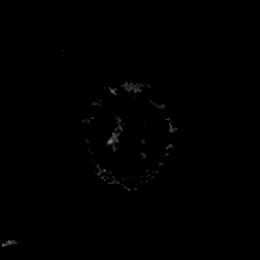

[Series 9: T1 · sagittal · 5.0mm · 0.72mm/px · 2 of 25 slices shown (1 of 2)]
[im 1/25]
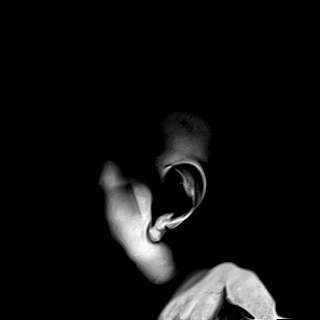
[im 25/25]
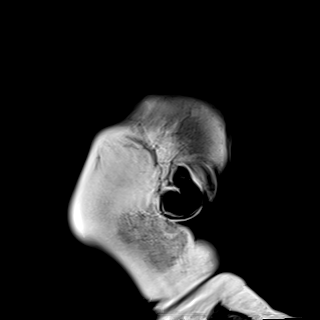

[Series 10: T2 · axial · 5.0mm · 0.72mm/px · z∈[-68,+72]mm · 2 of 25 slices shown (1 of 2)]
[im 1/25]
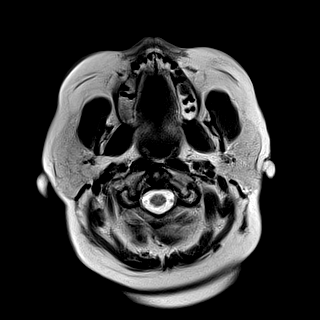
[im 25/25]
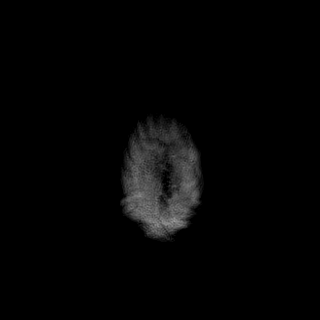

[Series 12: mag_images · axial · 3.0mm · 0.90mm/px · z∈[-73,+76]mm · 4 of 52 slices shown]
[im 1/52]
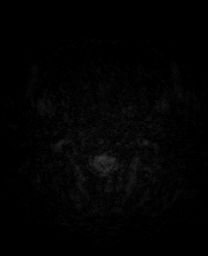
[im 18/52]
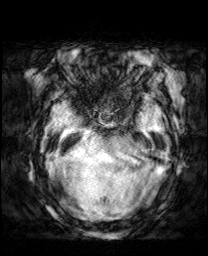
[im 35/52]
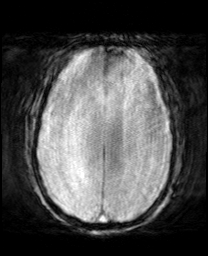
[im 52/52]
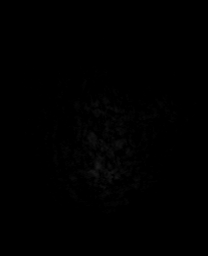

[Series 13: pha_images · axial · 3.0mm · 0.90mm/px · z∈[-67,+64]mm · 4 of 46 slices shown]
[im 1/46]
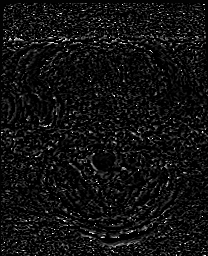
[im 16/46]
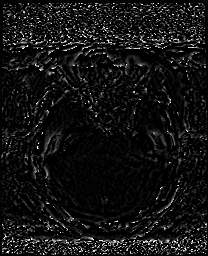
[im 31/46]
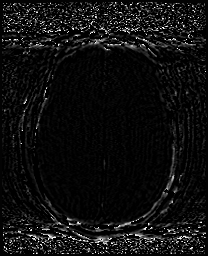
[im 46/46]
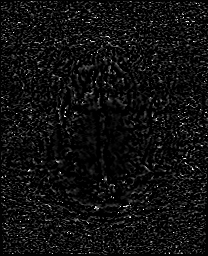

[Series 14: swi_images · axial · 3.0mm · 0.90mm/px · z∈[-73,+76]mm · 4 of 52 slices shown]
[im 1/52]
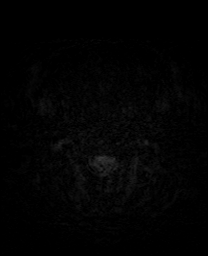
[im 18/52]
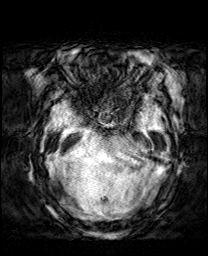
[im 35/52]
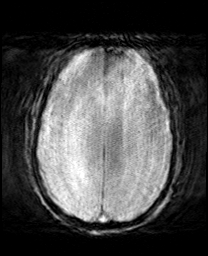
[im 52/52]
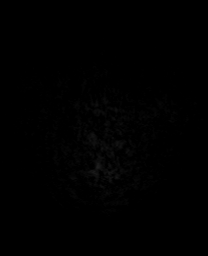

[Series 15: mip_images(sw) · axial · 24.0mm · 0.90mm/px · z∈[-63,+65]mm · 4 of 45 slices shown]
[im 1/45]
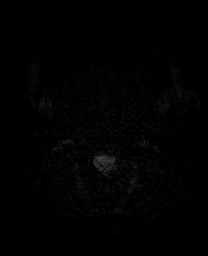
[im 15/45]
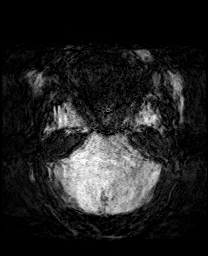
[im 30/45]
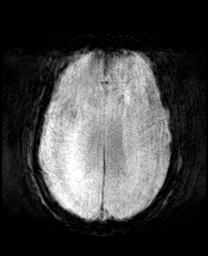
[im 45/45]
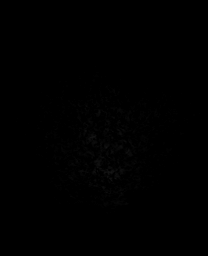

[Series 16: T2 · coronal · 5.0mm · 0.34mm/px · 2 of 29 slices shown (2 of 2)]
[im 1/29]
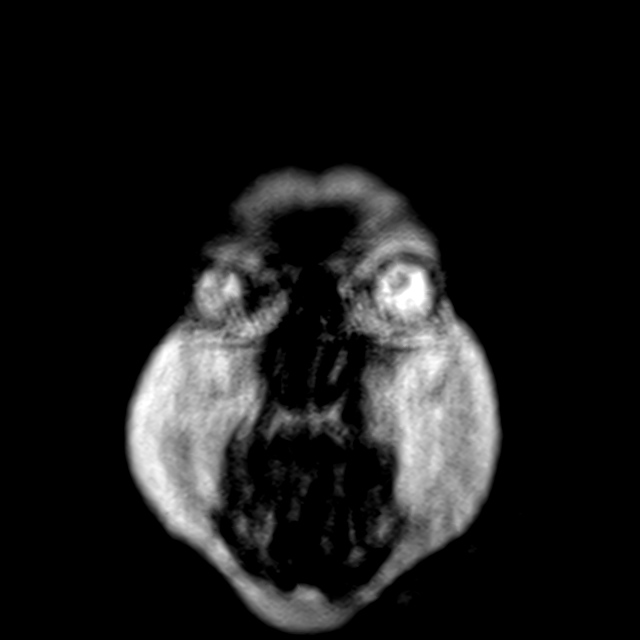
[im 29/29]
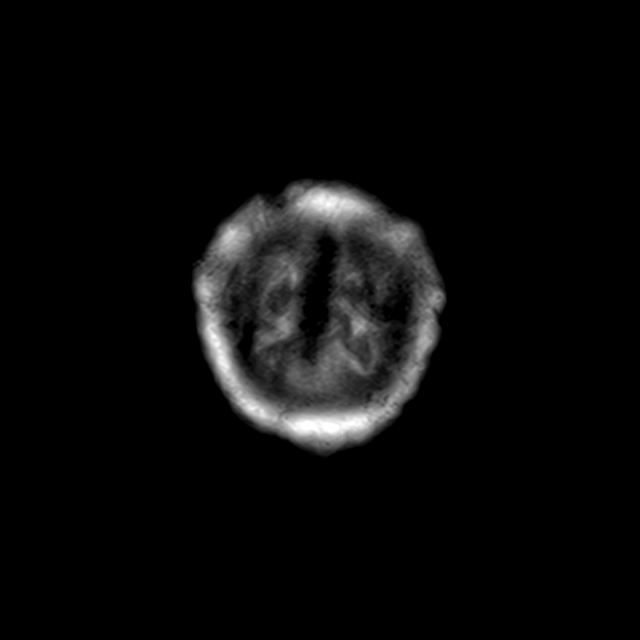

[Series 17: T1 · axial · 5.0mm · 0.40mm/px · z∈[-68,+71]mm · 2 of 25 slices shown (2 of 2)]
[im 1/25]
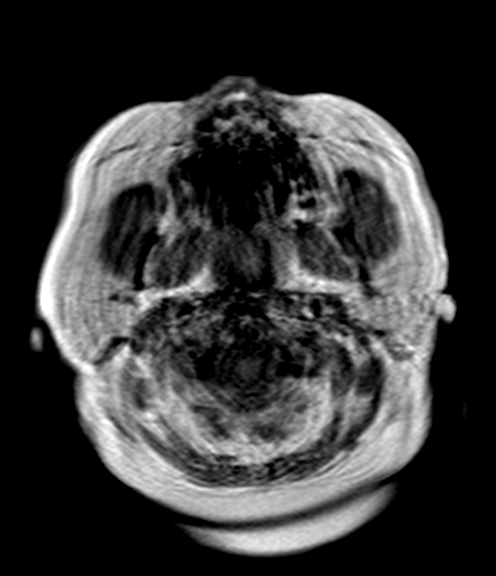
[im 25/25]
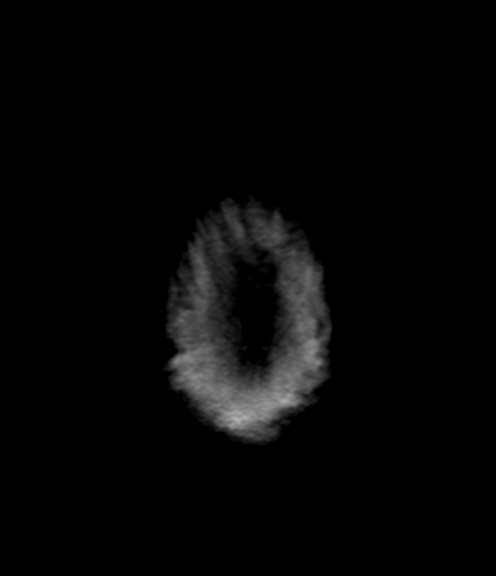

[Series 18: FLAIR · axial · 5.0mm · 0.86mm/px · z∈[-69,+70]mm · 2 of 25 slices shown]
[im 1/25]
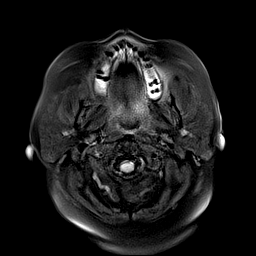
[im 25/25]
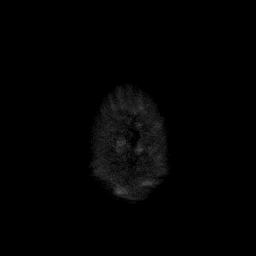

[Series 19: ax hemo · axial · 5.0mm · 0.86mm/px · z∈[-74,+66]mm · 2 of 25 slices shown]
[im 1/25]
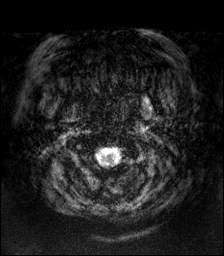
[im 25/25]
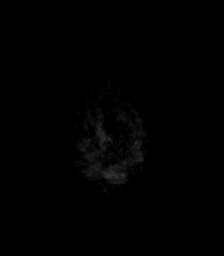

[48 of 48 positions shown; findings below may reference images not displayed]

FINDINGS: Brain: Examination moderately degraded by motion artifact.

Cerebral volume within normal limits for age. Patchy T2/FLAIR
hyperintensity within the periventricular deep white matter both
cerebral hemispheres most consistent with chronic small vessel
ischemic disease, mild to moderate in nature.

No abnormal foci of restricted diffusion to suggest acute or
subacute ischemia. Gray-white matter differentiation maintained. No
encephalomalacia to suggest chronic cortical infarction. No acute or
chronic intracranial hemorrhage.

No mass lesion, midline shift or mass effect. No hydrocephalus or
extra-axial fluid collection. Pituitary gland suprasellar region
within normal limits. Midline structures intact.

Vascular: Major intracranial vascular flow voids are maintained.

Skull and upper cervical spine: Craniocervical junction within
normal limits. Upper cervical spine normal. Bone marrow signal
intensity within normal limits. No scalp soft tissue abnormality.

Sinuses/Orbits: Globes and orbital soft tissues within normal
limits. Paranasal sinuses are clear. Small bilateral mastoid
effusions noted, of doubtful significance.

Other: None.
IMPRESSION: 1. No acute intracranial abnormality.
2. Mild to moderate chronic microvascular ischemic disease.

## 2020-03-05 IMAGING — MR MR LUMBAR SPINE W/O CM
4 of 5 series · 29 of 48 positions shown · non-contrast
Comparison: None.

CLINICAL DATA: Acute onset right leg weakness, numbness and
tingling on approximately [DATE]. Right worse than left leg
weakness. History of multiple falls. Initial encounter.

EXAM:
MRI LUMBAR SPINE WITHOUT CONTRAST
TECHNIQUE: Multiplanar, multisequence MR imaging of the lumbar spine was
performed. No intravenous contrast was administered.

[Series 1: T2 · sagittal · 4.0mm · 0.73mm/px · 8 of 17 slices shown (1 of 2)]
[im 1/17]
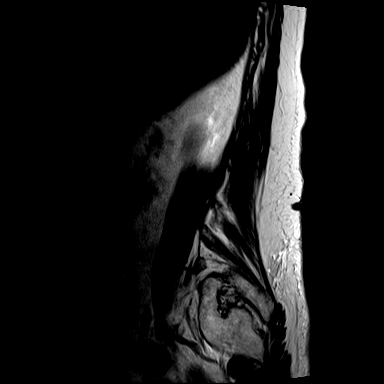
[im 3/17]
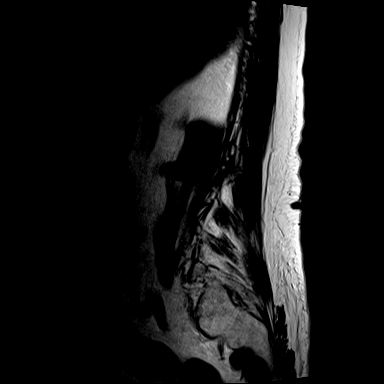
[im 5/17]
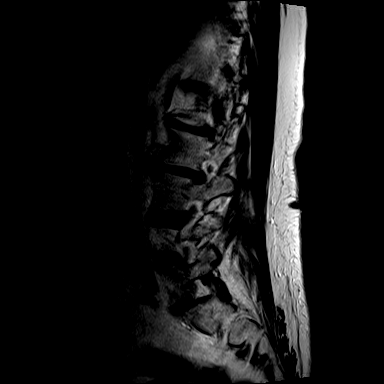
[im 7/17]
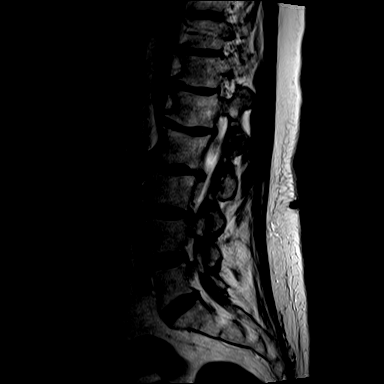
[im 10/17]
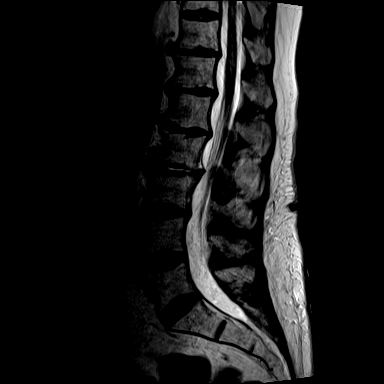
[im 12/17]
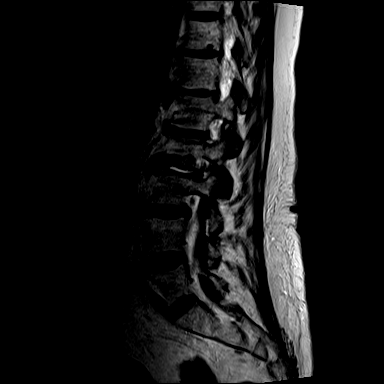
[im 14/17]
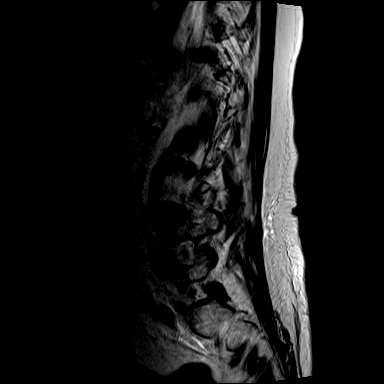
[im 17/17]
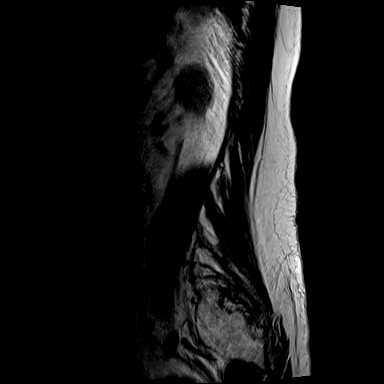

[Series 3: T1 · sagittal · 4.0mm · 0.88mm/px · 8 of 17 slices shown (1 of 2)]
[im 1/17]
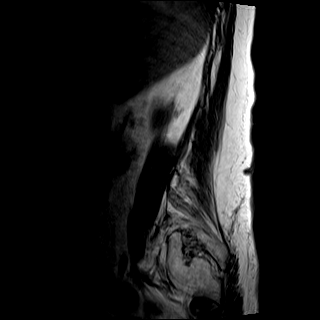
[im 3/17]
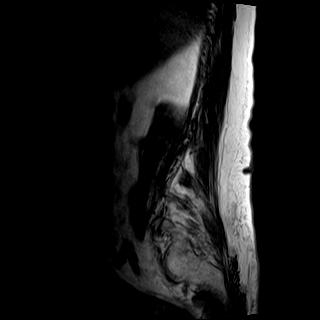
[im 5/17]
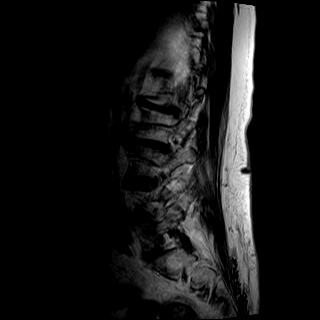
[im 7/17]
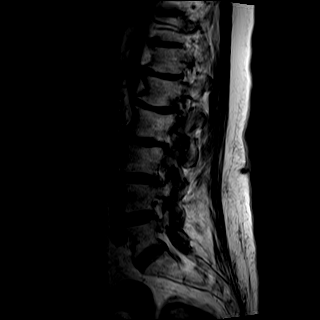
[im 10/17]
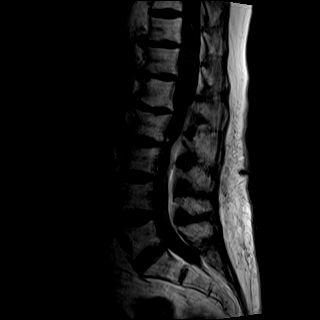
[im 12/17]
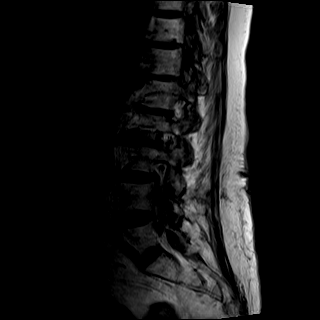
[im 14/17]
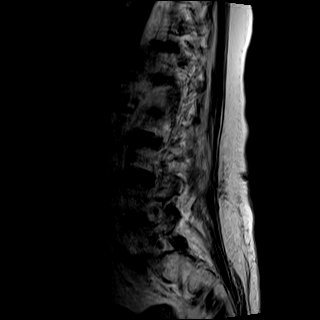
[im 17/17]
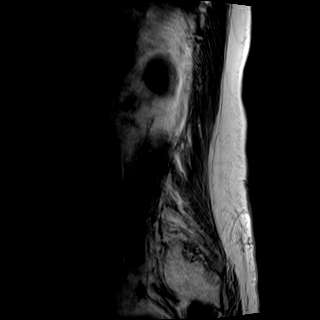

[Series 4: T2 · axial · 5.0mm · 0.57mm/px · z∈[-509,-329]mm · 9 of 25 slices shown (2 of 2)]
[im 1/25]
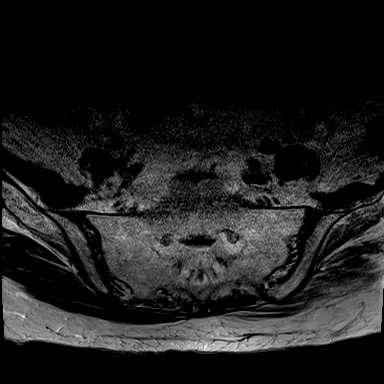
[im 5/25]
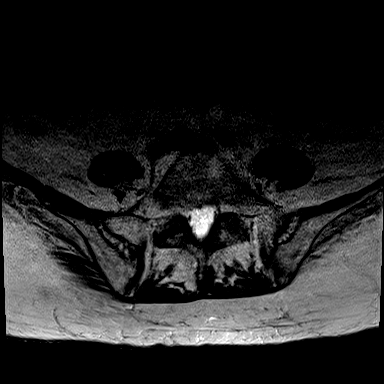
[im 7/25]
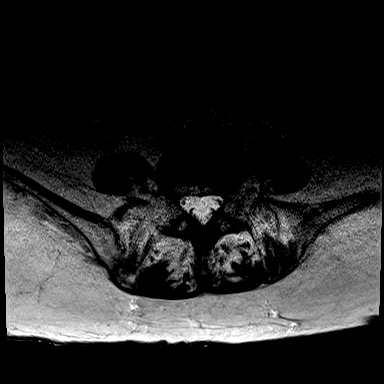
[im 11/25]
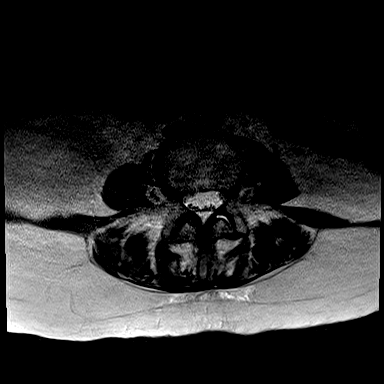
[im 14/25]
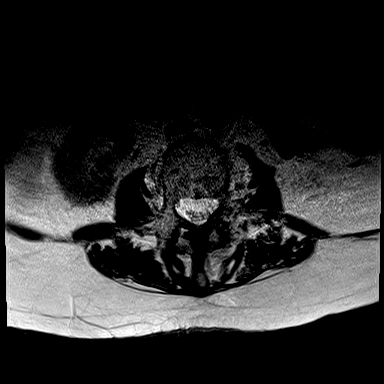
[im 18/25]
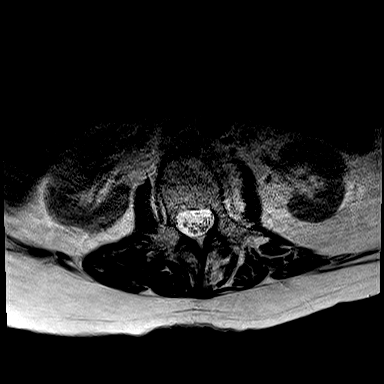
[im 20/25]
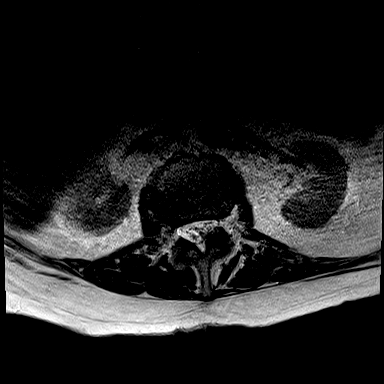
[im 22/25]
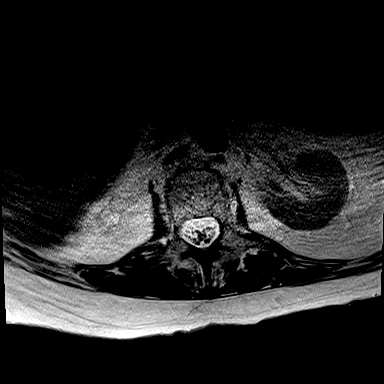
[im 25/25]
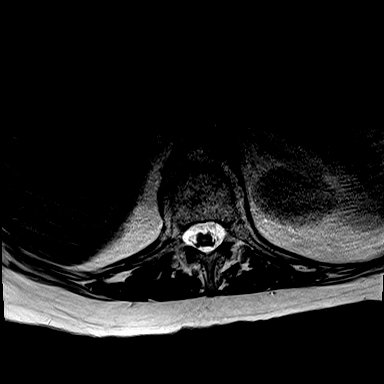

[Series 5: T1 · axial · 5.0mm · 0.34mm/px · z∈[-509,-352]mm · 4 of 25 slices shown (2 of 2)]
[im 1/25]
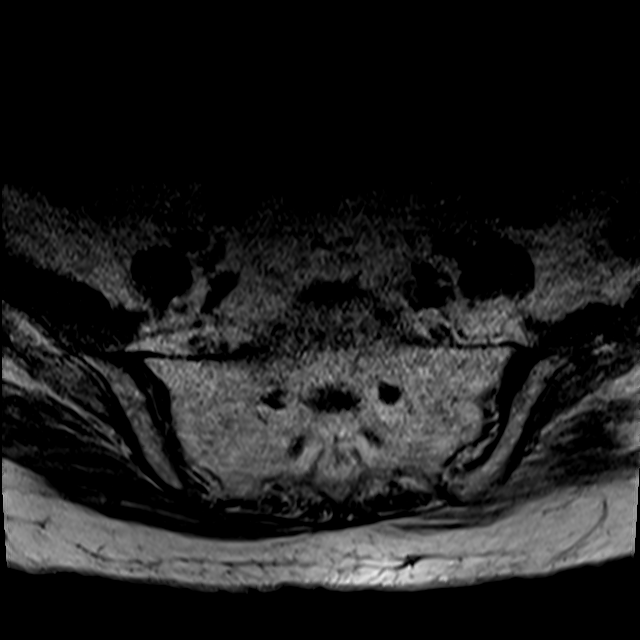
[im 5/25]
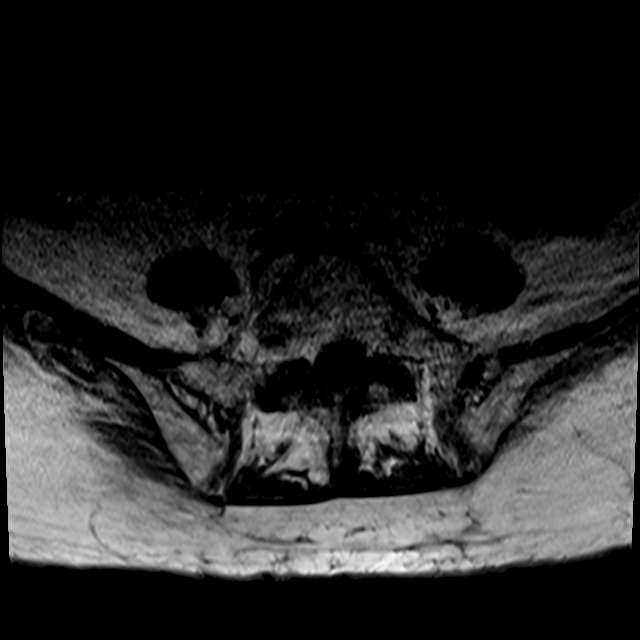
[im 14/25]
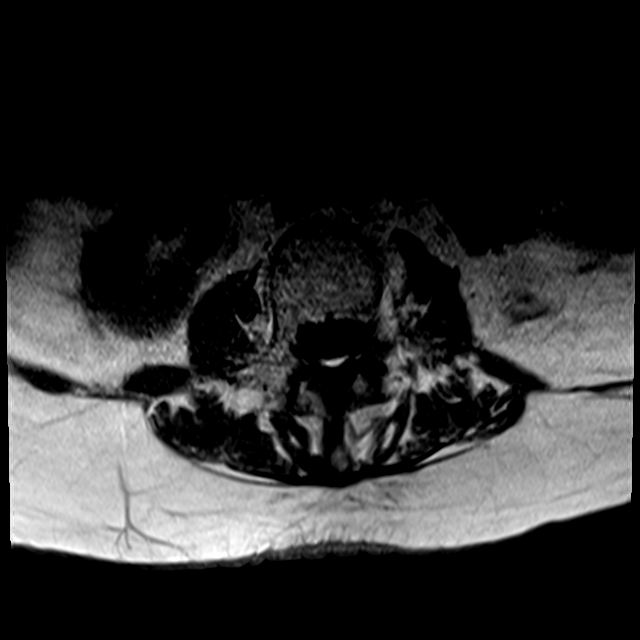
[im 22/25]
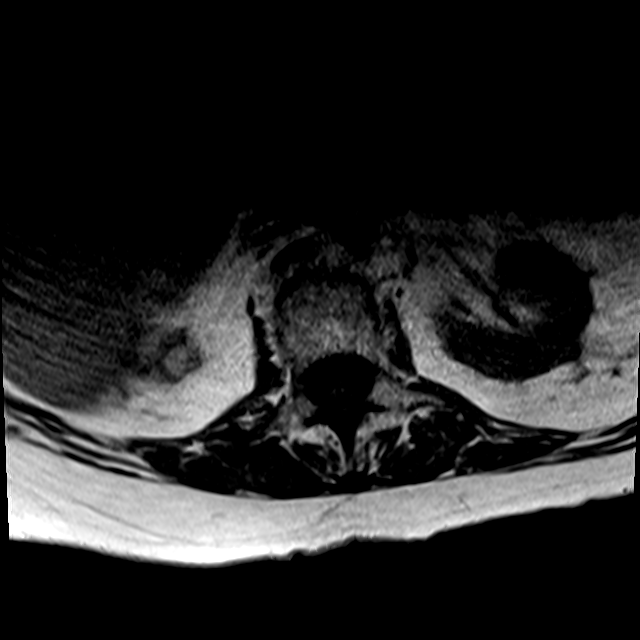

[29 of 48 positions shown; findings below may reference images not displayed]

FINDINGS: Segmentation:  Standard.

Alignment:  Maintained.

Vertebrae:  No fracture, evidence of discitis, or bone lesion.

Conus medullaris and cauda equina: Conus extends to the L1 level.
Conus and cauda equina appear normal.

Paraspinal and other soft tissues: Negative.

Disc levels:

L1-2: Loss of disc space height with a shallow bulge.  No stenosis.

L2-3: Loss of disc space height with a bulge and endplate spur more
prominent to the left. Mild left foraminal narrowing. The central
canal and right foramen are open.

L3-4: Disc bulge to the right causes mild narrowing in the right
lateral recess. Foramina open.

L4-5: Shallow disc bulge without stenosis.

L5-S1: Negative.
IMPRESSION: No finding to explain the patient's symptoms. Mild lumbar
spondylosis as described above.

## 2020-03-05 MED ORDER — GABAPENTIN 400 MG PO CAPS
400.0000 mg | ORAL_CAPSULE | Freq: Two times a day (BID) | ORAL | Status: DC
  Administered 2020-03-05 – 2020-03-06 (×3): 400 mg via ORAL
  Filled 2020-03-05 (×3): qty 1

## 2020-03-05 MED ORDER — DULOXETINE HCL 60 MG PO CPEP
60.0000 mg | ORAL_CAPSULE | Freq: Two times a day (BID) | ORAL | Status: DC
  Administered 2020-03-05 – 2020-03-18 (×27): 60 mg via ORAL
  Filled 2020-03-05 (×28): qty 1

## 2020-03-05 MED ORDER — SODIUM CHLORIDE 0.9% FLUSH
3.0000 mL | INTRAVENOUS | Status: DC | PRN
  Administered 2020-03-05: 3 mL via INTRAVENOUS

## 2020-03-05 MED ORDER — ONDANSETRON HCL 4 MG/2ML IJ SOLN: 4.0000 mg | Freq: Four times a day (QID) | INTRAMUSCULAR | Status: DC | PRN

## 2020-03-05 MED ORDER — SODIUM CHLORIDE 0.9% FLUSH
3.0000 mL | Freq: Two times a day (BID) | INTRAVENOUS | Status: DC
  Administered 2020-03-05 – 2020-03-18 (×26): 3 mL via INTRAVENOUS

## 2020-03-05 MED ORDER — ALBUTEROL SULFATE HFA 108 (90 BASE) MCG/ACT IN AERS
2.0000 | INHALATION_SPRAY | Freq: Four times a day (QID) | RESPIRATORY_TRACT | Status: DC | PRN
  Filled 2020-03-05: qty 6.7

## 2020-03-05 MED ORDER — AMIODARONE HCL 200 MG PO TABS
200.0000 mg | ORAL_TABLET | Freq: Two times a day (BID) | ORAL | Status: DC
  Administered 2020-03-05 – 2020-03-06 (×3): 200 mg via ORAL
  Filled 2020-03-05 (×3): qty 1

## 2020-03-05 MED ORDER — ALBUTEROL SULFATE (2.5 MG/3ML) 0.083% IN NEBU: 2.5000 mg | INHALATION_SOLUTION | Freq: Four times a day (QID) | RESPIRATORY_TRACT | Status: DC | PRN

## 2020-03-05 MED ORDER — ACETAMINOPHEN 325 MG PO TABS
650.0000 mg | ORAL_TABLET | Freq: Four times a day (QID) | ORAL | Status: DC | PRN
  Administered 2020-03-05: 650 mg via ORAL
  Filled 2020-03-05: qty 2

## 2020-03-05 MED ORDER — FUROSEMIDE 40 MG PO TABS
40.0000 mg | ORAL_TABLET | Freq: Every day | ORAL | Status: DC
  Administered 2020-03-05 – 2020-03-06 (×2): 40 mg via ORAL
  Filled 2020-03-05: qty 1
  Filled 2020-03-05: qty 2

## 2020-03-05 MED ORDER — SODIUM CHLORIDE 0.9 % IV SOLN: 250.0000 mL | INTRAVENOUS | Status: DC | PRN

## 2020-03-05 MED ORDER — ONDANSETRON HCL 4 MG PO TABS: 4.0000 mg | ORAL_TABLET | Freq: Four times a day (QID) | ORAL | Status: DC | PRN

## 2020-03-05 MED ORDER — METOPROLOL SUCCINATE ER 100 MG PO TB24
100.0000 mg | ORAL_TABLET | Freq: Every day | ORAL | Status: DC
  Administered 2020-03-05 – 2020-03-18 (×13): 100 mg via ORAL
  Filled 2020-03-05 (×14): qty 1

## 2020-03-05 MED ORDER — ACETAMINOPHEN 650 MG RE SUPP: 650.0000 mg | Freq: Four times a day (QID) | RECTAL | Status: DC | PRN

## 2020-03-05 MED ORDER — SODIUM CHLORIDE 0.9% FLUSH
3.0000 mL | Freq: Two times a day (BID) | INTRAVENOUS | Status: DC
  Administered 2020-03-05 – 2020-03-06 (×3): 3 mL via INTRAVENOUS

## 2020-03-05 MED ORDER — POLYETHYLENE GLYCOL 3350 17 G PO PACK
17.0000 g | PACK | Freq: Every day | ORAL | Status: DC | PRN
  Administered 2020-03-08: 17 g via ORAL
  Filled 2020-03-05: qty 1

## 2020-03-05 MED ORDER — ALPRAZOLAM 0.5 MG PO TABS
0.5000 mg | ORAL_TABLET | Freq: Four times a day (QID) | ORAL | Status: DC | PRN
  Administered 2020-03-06 – 2020-03-17 (×21): 0.5 mg via ORAL
  Filled 2020-03-05 (×21): qty 1

## 2020-03-05 MED ORDER — PANTOPRAZOLE SODIUM 40 MG PO TBEC
40.0000 mg | DELAYED_RELEASE_TABLET | Freq: Two times a day (BID) | ORAL | Status: DC
  Administered 2020-03-05 – 2020-03-18 (×27): 40 mg via ORAL
  Filled 2020-03-05 (×27): qty 1

## 2020-03-05 NOTE — Plan of Care (Signed)
Sign out received from Dr. Cheral Marker who saw the patient short while ago. MR C-, T- and L-spine ordered w/o contrast (poor renal function, so no contrast ordered). Will follow once diagnostic studies return. -- Amie Portland, MD [facility] Pager: [phone] If 7pm to 7am, please call on call as listed on [facility]ION.

## 2020-03-05 NOTE — ED Provider Notes (Signed)
7:25 AM Assumed care from Drs. Papier and Schlossman, please see their note for full history, physical and decision making until this point. In brief this is a 59 y.o. year old female who presented to the ED tonight with Weakness and right leg numbness     Ultimately had negative MRI brain and neurology saw with multiple recommendations and admission so d/w hospitalist for same.   Labs, studies and imaging reviewed by myself and considered in medical decision making if ordered. Imaging interpreted by radiology.  Labs Reviewed  COMPREHENSIVE METABOLIC PANEL - Abnormal; Notable for the following components:      Result Value   Chloride 95 (*)    Glucose, Bld 119 (*)    Creatinine, Ser 1.36 (*)    AST 76 (*)    ALT 120 (*)    GFR calc non Af Amer 43 (*)    GFR calc Af Amer 50 (*)    All other components within normal limits  URINALYSIS, ROUTINE W REFLEX MICROSCOPIC - Abnormal; Notable for the following components:   Color, Urine STRAW (*)    Specific Gravity, Urine 1.003 (*)    All other components within normal limits  RAPID URINE DRUG SCREEN, HOSP PERFORMED - Abnormal; Notable for the following components:   Benzodiazepines POSITIVE (*)    All other components within normal limits  CBC - Abnormal; Notable for the following components:   RBC 3.13 (*)    Hemoglobin 9.6 (*)    HCT 31.0 (*)    All other components within normal limits  URINE CULTURE  SARS CORONAVIRUS 2 BY RT PCR (HOSPITAL ORDER, [place] [place] [facility])  AMMONIA  TSH  VITAMIN B12  FOLATE  RPR  HIV ANTIBODY (ROUTINE TESTING W REFLEX)  PORPHOBILINOGEN, RANDOM URINE  PORPHYRINS, FRACTIONATED URINE (TIMED COLLECTION)  HEPATITIS PANEL, ACUTE    MR BRAIN WO CONTRAST  Final Result    CT Head Wo Contrast  Final Result    DG Chest Portable 1 View  Final Result    MR CERVICAL SPINE WO CONTRAST    (Results Pending)  MR THORACIC SPINE WO CONTRAST    (Results Pending)  MR LUMBAR SPINE WO CONTRAST     (Results Pending)    No follow-ups on file.    [facility] Oran, Corene Cornea, MD 03/05/20 [phone]

## 2020-03-05 NOTE — ED Notes (Signed)
PT continues working with pt.

## 2020-03-05 NOTE — ED Notes (Signed)
Called MRI to let them know pt is now available for MRI.

## 2020-03-05 NOTE — ED Notes (Signed)
MRI called for pt. She will got to MRI after the eeg is completed.

## 2020-03-05 NOTE — ED Notes (Signed)
IV team at bedside. EEG tech said they could place line.

## 2020-03-05 NOTE — Evaluation (Signed)
Occupational Therapy Evaluation Patient Details Name: Brianna Porter MRN: [id number] DOB: August 29, 1960 Today's Date: 03/05/2020    History of Present Illness Pt is a 59 y/o female admitted secondary to convulsions, weakness and numbness. MRI of the brain, cervical spine, thoracic spine and lumbar spine were all negative for any acute findings. An EEG was done and results were WNL. Further neuro workup pending. PMH including but not limited to atrial fibrillation on Eliquis, nonischemic cardiomyopathy, depression with anxiety, chronic anemia, hypertension.    Clinical Impression   This 59 y/o female presents with the above. PTA pt living with her daughter, reports decline in mobility status as of recent and mostly requiring use of wheelchair, requiring assist from daughter/family for transfers to/from wheelchair and for ADL completion. Pt currently presenting with notable weakness, decreased sitting/standing balance and impaired cognition. Pt seated on commode in ED bathroom upon arrival today, overall requiring heavy two person assist for safe completion of functional transfers with use of Stedy for transfer from commode back to ED stretcher. Pt requiring up to [place] for LB ADL. Pt to benefit from continued acute OT services, she is currently at a high risk for falls; recommend SNF level therapies at time of discharge to maximize her safety and independence with ADL and mobility prior to return home.    Follow Up Recommendations  SNF;Supervision/Assistance - 24 hour    Equipment Recommendations  Other (comment);3 in 1 bedside commode (hoyer)           Precautions / Restrictions Precautions Precautions: Fall Precaution Comments: very fragile skin Restrictions Weight Bearing Restrictions: No      Mobility Bed Mobility Overal bed mobility: Needs Assistance Bed Mobility: Sit to Supine       Sit to supine: Max assist;+2 for physical assistance   General bed mobility comments: pt  required assistance with trunk control and management as well as for bilateral LEs to return back onto stretcher  Transfers Overall transfer level: Needs assistance Equipment used: 2 person hand held assist Transfers: Sit to/from Stand Sit to Stand: Max assist;+2 physical assistance         General transfer comment: increased time and effort, cueing for sequencing and technique, cueing for improved forward weight shift, pt with heavy posterior lean in standing; initial standing attempt from toilet with 2HHA and gait belt, pt requiring heavy max A x2 and maintained a posterior lean, unable to pivot. Pt able to stand more upright with use of STEDY, still requiring max A x2 to power up into standing and cueing for hip extension and forward lean    Balance Overall balance assessment: Needs assistance Sitting-balance support: Feet unsupported;Feet supported Sitting balance-Leahy Scale: Poor     Standing balance support: Bilateral upper extremity supported Standing balance-Leahy Scale: Poor Standing balance comment: bilateral UE supports and max A x2                           ADL either performed or assessed with clinical judgement   ADL Overall ADL's : Needs assistance/impaired Eating/Feeding: Set up;Sitting   Grooming: Wash/dry hands;Min guard;Sitting   Upper Body Bathing: Minimal assistance;Sitting   Lower Body Bathing: Maximal assistance;+2 for physical assistance;+2 for safety/equipment;Sitting/lateral leans;Sit to/from stand   Upper Body Dressing : Minimal assistance;Sitting   Lower Body Dressing: Total assistance;+2 for physical assistance;+2 for safety/equipment;Sit to/from stand   Toilet Transfer: Total assistance;+2 for physical assistance;+2 for safety/equipment;Maximal assistance Toilet Transfer Details (indicate cue type and reason): pt  seated on commode in ED bathroom upon arrival, completed sit<>stand with MaxA+2 however pt unable to safely pivot to  transport chair, use of Stedy for transfer off commode and back to bed - increased assist due to weakness as well as having to ensure pt safely transition onto ED stretcher from [place] given height of ED stretcher  Toileting- Clothing Manipulation and Hygiene: Maximal assistance;+2 for physical assistance;Sitting/lateral lean;Sit to/from stand       Functional mobility during ADLs: Maximal assistance;+2 for physical assistance;+2 for safety/equipment (sit<>stand ) General ADL Comments: pt with notable weakness, impaired sitting/standing balance and overall mobility status     Vision         Perception     Praxis      Pertinent Vitals/Pain Pain Assessment: Faces Faces Pain Scale: Hurts little more Pain Location: generalized with movement Pain Descriptors / Indicators: Guarding;Grimacing Pain Intervention(s): Monitored during session;Repositioned     Hand Dominance Right   Extremity/Trunk Assessment Upper Extremity Assessment Upper Extremity Assessment: LUE deficits/detail;RUE deficits/detail;Generalized weakness RUE Deficits / Details: limited shoulder ROM, mild tremors noted, grossly weak grasp, bruising/cuts/skin tears throughout UEs LUE Deficits / Details: limited shoulder ROM (pt reports baseline,) use of compensatory motions to attempt shoulder flexion beyond 90*, bruising/cuts/skin tears throughout UEs LUE Coordination: decreased gross motor   Lower Extremity Assessment Lower Extremity Assessment: Defer to PT evaluation RLE Deficits / Details: multiple bruises and blood blisters noted throughout RLE: Unable to fully assess due to pain LLE Deficits / Details: multiple bruises and blood blisters noted throughout LLE: Unable to fully assess due to pain       Communication Communication Communication: No difficulties   Cognition Arousal/Alertness: Awake/alert Behavior During Therapy: Anxious Overall Cognitive Status: Impaired/Different from baseline Area of  Impairment: Attention;Memory;Following commands;Safety/judgement;Awareness;Problem solving                   Current Attention Level: Selective Memory: Decreased short-term memory Following Commands: Follows one step commands with increased time;Follows one step commands inconsistently Safety/Judgement: Decreased awareness of deficits;Decreased awareness of safety Awareness: Intellectual Problem Solving: Slow processing;Requires verbal cues;Difficulty sequencing;Requires tactile cues     General Comments       Exercises     Shoulder Instructions      Home Living Family/patient expects to be discharged to:: Private residence Living Arrangements: Children Available Help at Discharge: Family;Friend(s);Available 24 hours/day Type of Home: House Home Access: Stairs to enter [employer] of Steps: 2   Home Layout: One level     Bathroom Shower/Tub: Teacher, early years/pre: Handicapped height Bathroom Accessibility: Yes   Home Equipment: Environmental consultant - 2 wheels;Bedside commode;Shower seat;Toilet riser;Wheelchair - manual          Prior Functioning/Environment Level of Independence: Needs assistance  Gait / Transfers Assistance Needed: has been using a w/c for mobility; requires heavy physical assistance from daughter for transfers ADL's / Homemaking Assistance Needed: requires assistance from family with bathing/dressing            OT Problem List: Decreased strength;Decreased range of motion;Decreased activity tolerance;Impaired balance (sitting and/or standing);Decreased coordination;Decreased cognition;Decreased safety awareness;Decreased knowledge of use of DME or AE;Decreased knowledge of precautions;Impaired UE functional use      OT Treatment/Interventions: Self-care/ADL training;Therapeutic exercise;Energy conservation;DME and/or AE instruction;Therapeutic activities;Cognitive remediation/compensation;Patient/family education;Balance training     OT Goals(Current goals can be found in the care plan section) Acute Rehab OT Goals Patient Stated Goal: to get stronger OT Goal Formulation: With patient Time For Goal Achievement: 03/19/20  Potential to Achieve Goals: Good  OT Frequency: Min 2X/week   Barriers to D/C:            Co-evaluation PT/OT/SLP Co-Evaluation/Treatment: Yes Reason for Co-Treatment: For patient/therapist safety;To address functional/ADL transfers PT goals addressed during session: Mobility/safety with mobility;Balance;Proper use of DME;Strengthening/ROM OT goals addressed during session: ADL's and self-care      AM-PAC OT "6 Clicks" Daily Activity     Outcome Measure Help from another person eating meals?: A Little Help from another person taking care of personal grooming?: A Little Help from another person toileting, which includes using toliet, bedpan, or urinal?: A Lot Help from another person bathing (including washing, rinsing, drying)?: A Lot Help from another person to put on and taking off regular upper body clothing?: A Lot Help from another person to put on and taking off regular lower body clothing?: Total 6 Click Score: 13   End of Session Equipment Utilized During Treatment: Gait belt Nurse Communication: Mobility status  Activity Tolerance: Patient tolerated treatment well Patient left: in bed;with call bell/phone within reach  OT Visit Diagnosis: Unsteadiness on feet (R26.81);Other abnormalities of gait and mobility (R26.89);Muscle weakness (generalized) (M62.81);Other symptoms and signs involving cognitive function;Other symptoms and signs involving the nervous system (R29.898)                Time: 7591-6384 OT Time Calculation (min): 34 min Charges:  OT General Charges $OT Visit: 1 Visit OT Evaluation $OT Eval Moderate Complexity: [place], OT Acute Rehabilitation Services Pager [phone] Office [phone]   Raymondo Band 03/05/2020, 5:36 PM

## 2020-03-05 NOTE — ED Notes (Signed)
2 person assist to RR via [place] for BM.

## 2020-03-05 NOTE — Progress Notes (Signed)
PT Cancellation Note  Patient Details Name: Brianna Porter MRN: [id number] DOB: [birth year]   Cancelled Treatment:    Reason Eval/Treat Not Completed: Patient at procedure or test/unavailable (MRI). PT will continue to f/u with pt as available and appropriate.    Clearnce Sorrel Breaker Springer 03/05/2020, 10:12 AM

## 2020-03-05 NOTE — Evaluation (Signed)
Physical Therapy Evaluation Patient Details Name: Brianna Porter MRN: [id number] DOB: 07/21/1961 Today's Date: 03/05/2020   History of Present Illness  Pt is a 59 y/o female admitted secondary to convulsions, weakness and numbness. MRI of the brain, cervical spine, thoracic spine and lumbar spine were all negative for any acute findings. An EEG was done and results were WNL. Further neuro workup pending. PMH including but not limited to atrial fibrillation on Eliquis, nonischemic cardiomyopathy, depression with anxiety, chronic anemia, hypertension.     Clinical Impression  Pt presented sitting on toilet in ED with nurse tech present and willing to participate in therapy session. Prior to admission, pt reported that she was using a w/c primarily for mobility, required heavy physical assistance from her daughter for transfers and required assistance with all ADLs. Pt stated that it has been a few months since she has walked with her RW. Pt lives with her daughter in a single level home with two steps to enter. She stated that her daughter bumps her up and down the stairs in her w/c. At the time of evaluation, pt was significantly limited with functional mobility secondary to generalized weakness, fatigue, poor balance and coordination as well as pain (pt with very fragile skin). Pt required max A x2 for bed mobility and for sit<>stand with use of STEDY. Based on pt's current functional mobility status and caregiver burden at home, would recommend pt d/c to SNF for further intensive therapy services to maximize her independence with functional mobility prior to returning home with family support. Pt would continue to benefit from skilled physical therapy services at this time while admitted and after d/c to address the below listed limitations in order to improve overall safety and independence with functional mobility.     Follow Up Recommendations SNF    Equipment Recommendations  Other (comment)  Optician, dispensing)    Recommendations for Other Services       Precautions / Restrictions Precautions Precautions: Fall Precaution Comments: very fragile skin Restrictions Weight Bearing Restrictions: No      Mobility  Bed Mobility Overal bed mobility: Needs Assistance Bed Mobility: Sit to Supine       Sit to supine: Max assist;+2 for physical assistance   General bed mobility comments: pt required assistance with trunk control and management as well as for bilateral LEs to return back onto stretcher  Transfers Overall transfer level: Needs assistance Equipment used: 2 person hand held assist Transfers: Sit to/from Stand Sit to Stand: Max assist;+2 physical assistance         General transfer comment: increased time and effort, cueing for sequencing and technique, cueing for improved forward weight shift, pt with heavy posterior lean in standing; initial standing attempt from toilet with 2HHA and gait belt, pt requiring heavy max A x2 and maintained a posterior lean, unable to pivot. Pt able to stand more upright with use of STEDY, still requiring max A x2 to power up into standing and cueing for hip extension and forward lean  Ambulation/Gait             General Gait Details: unable  Stairs            Wheelchair Mobility    Modified Rankin (Stroke Patients Only)       Balance Overall balance assessment: Needs assistance Sitting-balance support: Feet unsupported;Feet supported Sitting balance-Leahy Scale: Poor     Standing balance support: Bilateral upper extremity supported Standing balance-Leahy Scale: Poor Standing balance comment: bilateral UE supports and  max A x2                             Pertinent Vitals/Pain Pain Assessment: Faces Faces Pain Scale: Hurts little more Pain Location: generalized with movement Pain Descriptors / Indicators: Guarding;Grimacing Pain Intervention(s): Monitored during session;Repositioned    Home  Living Family/patient expects to be discharged to:: Private residence Living Arrangements: Children Available Help at Discharge: Family;Friend(s);Available 24 hours/day Type of Home: House Home Access: Stairs to enter   [employer] of Steps: 2 Home Layout: One level Home Equipment: Walker - 2 wheels;Bedside commode;Shower seat;Toilet riser;Wheelchair - manual      Prior Function Level of Independence: Needs assistance   Gait / Transfers Assistance Needed: has been using a w/c for mobility; requires heavy physical assistance from daughter for transfers  ADL's / Homemaking Assistance Needed: requires assistance from family with bathing/dressing        Hand Dominance        Extremity/Trunk Assessment   Upper Extremity Assessment Upper Extremity Assessment: Defer to OT evaluation    Lower Extremity Assessment Lower Extremity Assessment: Generalized weakness;RLE deficits/detail;LLE deficits/detail;Difficult to assess due to impaired cognition RLE Deficits / Details: multiple bruises and blood blisters noted throughout RLE: Unable to fully assess due to pain LLE Deficits / Details: multiple bruises and blood blisters noted throughout LLE: Unable to fully assess due to pain       Communication   Communication: No difficulties  Cognition Arousal/Alertness: Awake/alert Behavior During Therapy: Anxious Overall Cognitive Status: Impaired/Different from baseline Area of Impairment: Attention;Memory;Following commands;Safety/judgement;Awareness;Problem solving                   Current Attention Level: Selective Memory: Decreased short-term memory Following Commands: Follows one step commands with increased time;Follows one step commands inconsistently Safety/Judgement: Decreased awareness of deficits;Decreased awareness of safety Awareness: Intellectual Problem Solving: Slow processing;Requires verbal cues;Difficulty sequencing;Requires tactile cues         General Comments      Exercises     Assessment/Plan    PT Assessment Patient needs continued PT services  PT Problem List Decreased strength;Decreased range of motion;Decreased activity tolerance;Decreased balance;Decreased mobility;Decreased coordination;Decreased cognition;Decreased knowledge of use of DME;Decreased safety awareness;Decreased knowledge of precautions;Pain       PT Treatment Interventions DME instruction;Functional mobility training;Therapeutic activities;Therapeutic exercise;Balance training;Neuromuscular re-education;Patient/family education;Cognitive remediation;Wheelchair mobility training;Gait training    PT Goals (Current goals can be found in the Care Plan section)  Acute Rehab PT Goals Patient Stated Goal: to get stronger PT Goal Formulation: With patient Time For Goal Achievement: 03/19/20 Potential to Achieve Goals: Fair    Frequency Min 2X/week   Barriers to discharge        Co-evaluation PT/OT/SLP Co-Evaluation/Treatment: Yes Reason for Co-Treatment: For patient/therapist safety;To address functional/ADL transfers PT goals addressed during session: Mobility/safety with mobility;Balance;Proper use of DME;Strengthening/ROM         AM-PAC PT "6 Clicks" Mobility  Outcome Measure Help needed turning from your back to your side while in a flat bed without using bedrails?: A Lot Help needed moving from lying on your back to sitting on the side of a flat bed without using bedrails?: A Lot Help needed moving to and from a bed to a chair (including a wheelchair)?: Total Help needed standing up from a chair using your arms (e.g., wheelchair or bedside chair)?: A Lot Help needed to walk in hospital room?: Total Help needed climbing 3-5 steps with a  railing? : Total 6 Click Score: 9    End of Session Equipment Utilized During Treatment: Gait belt Activity Tolerance: Patient limited by fatigue Patient left: in bed;with call bell/phone within  reach;Other (comment) (on stretcher in ED) Nurse Communication: Mobility status;Need for lift equipment PT Visit Diagnosis: Other abnormalities of gait and mobility (R26.89);Muscle weakness (generalized) (M62.81)    Time: 0017-4944 PT Time Calculation (min) (ACUTE ONLY): 35 min   Charges:   PT Evaluation $PT Eval Moderate Complexity: 1 Mod          Eduard Clos, PT, DPT  Acute Rehabilitation Services Pager [phone] Office [place] 03/05/2020, 4:00 PM

## 2020-03-05 NOTE — H&P (Addendum)
History and Physical    Brianna Porter ZOX:[id number] DOB: October 26, 1960 DOA: 03/04/2020  PCP: Cher Nakai, MD   Patient coming from: Home   Chief Complaint: numbness, weakness, convulsions   HPI: Brianna Porter is a 59 y.o. female with medical history significant for atrial fibrillation on Eliquis, nonischemic cardiomyopathy, depression with anxiety, chronic anemia, hypertension, now presenting to the emergency department with bilateral lower extremity paresthesias, right hand weakness, intermittent limb jerking, and convulsions.  [name], and then had a second similar episode 3 days ago but refused transport to the hospital at that time.  [name].  Over the past 1 to 2 days, the right hand has also been weak and lacking coordination, causing her to drop things.  Until a couple months ago, she had been ambulating with a cane, then had to use a walker due to worsening weakness and difficulty with coordination; now more recently she has been relying on a wheelchair.  [name].  She denies fevers, chills, chest pain, cough, or shortness of breath.  ED Course: Upon arrival to the ED, patient is found to be afebrile, saturating well on room air, and with stable blood pressure.  EKG features and undetermined rhythm.  Chest x-rays negative acute cardiopulmonary disease.  Noncontrast head CT is negative for acute intracranial abnormality.  MRI brain is negative for acute intracranial abnormality.  Chemistry panel notable for AST 76, ALT 120, and creatinine 1.36.  CBC features a stable normocytic anemia.  UDS positive for benzodiazepines only (prescribed).  Urinalysis features a low specific gravity.  Neurology was consulted by the ED  physician and hospitalist asked to admit.  Review of Systems:  All other systems reviewed and apart from HPI, are negative.  Past Medical History:  Diagnosis Date  . Acute gastric ulcer with hemorrhage   . Acute systolic heart failure ([place]) 05/07/2019  . AKI (acute kidney injury) ([place]) 05/07/2019  . Altered mental status 05/07/2019  . Anemia 04/23/2019  . Anxiety and depression   . Atrial fibrillation with rapid ventricular response ([place]) 04/29/2019  . Atrial flutter ([place])   . CAD (coronary artery disease)   . Chronic systolic CHF (congestive heart failure) (HCC)    a. dx in setting of atrial fib/flutter - possibly tachy mediated. Coronary CTA with only mild CAD in 04/2019.  Marland Kitchen CKD (chronic kidney disease) stage 3, GFR 30-59 ml/min 04/23/2019  . CKD (chronic kidney disease), stage II   . Confusion    a. persistent confusion during 04/2019 admission of unclear cause. Home meds adjusted. CT/MRI brain nonacute.  . Depression 03/02/2019  . Diabetes ([place])   . Edema   . Elevated liver function tests   . Essential hypertension   . Fall 12/23/2019  . Gastrointestinal hemorrhage   . Hypercholesteremia   . Hyperkalemia   . Hyperlipidemia 04/23/2019  . Hypertension   . Hypertensive heart and chronic kidney disease with systolic congestive heart failure ([place])   . Hypertensive heart disease 03/02/2019  . Hyperthyroidism   . Hypokalemia   . Hypokalemia   . Hypomagnesemia   . Hyponatremia   . Iron deficiency anemia   . Mild CAD    a. Coronary CT 04/2019 - Minimal, Non-obstructive CAD.  Marland Kitchen NICM (nonischemic cardiomyopathy) ([place])   .  Osteoarthritis 03/02/2019  . Persistent atrial fibrillation ([place])   . Pressure injury of skin 07/09/2019  . Prolonged Q-T interval on ECG 04/30/2019  . Prolonged QT interval   . Symptomatic anemia 06/28/2019  . Type 2 diabetes mellitus with complication, without long-term current use of insulin ([place]) 03/02/2019    Past Surgical History:  Procedure Laterality Date  . BIOPSY   06/29/2019   Procedure: BIOPSY;  Surgeon: Irene Shipper, MD;  Location: [facility];  Service: Endoscopy;;  . CARDIOVERSION N/A 04/29/2019   Procedure: CARDIOVERSION;  Surgeon: Sanda Klein, MD;  Location: [place];  Service: Cardiovascular;  Laterality: N/A;  . CARDIOVERSION N/A 05/04/2019   Procedure: CARDIOVERSION;  Surgeon: Josue Hector, MD;  Location: [place];  Service: Cardiovascular;  Laterality: N/A;  . COLONOSCOPY WITH PROPOFOL N/A 07/12/2019   Procedure: COLONOSCOPY WITH PROPOFOL;  Surgeon: Doran Stabler, MD;  Location: [place];  Service: Gastroenterology;  Laterality: N/A;  . ENTEROSCOPY N/A 07/10/2019   Procedure: ENTEROSCOPY;  Surgeon: Doran Stabler, MD;  Location: [place] [facility];  Service: Gastroenterology;  Laterality: N/A;  . ESOPHAGOGASTRODUODENOSCOPY  06/29/2019  . ESOPHAGOGASTRODUODENOSCOPY (EGD) WITH PROPOFOL N/A 06/29/2019   Procedure: ESOPHAGOGASTRODUODENOSCOPY (EGD) WITH PROPOFOL;  Surgeon: Irene Shipper, MD;  Location[facility] END[facility]COPY;  Service: Endoscopy;  Laterality: N/A;  . GIVENS CAPSULE STUDY N/A 07/12/2019   Procedure: GIVENS CAPSULE STUDY;  Surgeon: Doran Stabler, MD;  Location: [place];  Service: Gastroenterology;  Laterality: N/A;  . HEMOSTASIS CLIP PLACEMENT  07/12/2019   Procedure: HEMOSTASIS CLIP PLACEMENT;  Surgeon: Doran Stabler, MD;  Location: [place];  Service: Gastroenterology;;  . POLYPECTOMY  07/12/2019   Procedure: POLYPECTOMY;  Surgeon: Doran Stabler, MD;  Location: [place];  Service: Gastroenterology;;  . SMALL BOWEL ENTEROSCOPY  07/10/2019  . SUBMUCOSAL TATTOO INJECTION  07/10/2019   Procedure: SUBMUCOSAL TATTOO INJECTION;  Surgeon: Doran Stabler, MD;  Location: [facility] ENDOSCOPY;  Service: Gastroenterology;;  . TEE WITHOUT CARDIOVERSION  04/29/2019  . TEE WITHOUT CARDIOVERSION N/A 04/29/2019   Procedure: TRANSESOPHAGEAL ECHOCARDIOGRAM (TEE);  Surgeon: Sanda Klein, MD;  Location: [facility] ENDOSCOPY;   Service: Cardiovascular;  Laterality: N/A;  . TUBAL LIGATION       reports that she quit smoking about 20 years ago. Her smoking use included cigarettes. She has a 60.00 pack-year smoking history. She has never used smokeless tobacco. She reports that she does not drink alcohol and does not use drugs.  Allergies  Allergen Reactions  . Ambien [Zolpidem Tartrate]     Pt and family state this med makes the patient sleepwalk.  . Adhesive [Tape]     Tears skin, please use paper tape  . Codeine Nausea And Vomiting    Family History  Problem Relation Age of Onset  . Cancer Mother   . Heart disease Mother   . Hypercholesterolemia Mother   . Osteoarthritis Mother   . Stroke Mother   . Heart disease Father   . Hypercholesterolemia Brother      Prior to Admission medications   Medication Sig Start Date End Date Taking? Authorizing Provider  acetaminophen (TYLENOL) 325 MG tablet Take 650 mg by mouth every 6 (six) hours as needed for mild pain.   Yes [provider]  albuterol (VENTOLIN HFA) 108 (90 Base) MCG/ACT inhaler Inhale 2 puffs into the lungs every 6 (six) hours as needed for wheezing or shortness of breath.   Yes [provider]  ALPRAZolam Duanne Moron) 0.5 MG  tablet Take 0.5 mg by mouth 4 (four) times daily as needed for anxiety or sleep.  02/12/20  Yes [provider]  amiodarone (PACERONE) 200 MG tablet Take 1 tablet (200 mg total) by mouth daily. Patient taking differently: Take 200 mg by mouth 2 (two) times daily.  10/12/19  Yes Camnitz, Ocie Doyne, MD  apixaban (ELIQUIS) 5 MG TABS tablet Take 1 tablet (5 mg total) by mouth 2 (two) times daily. 10/12/19  Yes Camnitz, Ocie Doyne, MD  Ascorbic Acid (VITAMIN C) 1000 MG tablet Take 1,000 mg by mouth daily.   Yes [provider]  buPROPion (WELLBUTRIN XL) 150 MG 24 hr tablet Take 150 mg by mouth daily.   Yes [provider]  cetirizine (ZYRTEC) 10 MG tablet Take 10 mg by mouth daily as needed  for allergies.   Yes [provider]  cholecalciferol (VITAMIN D3) 25 MCG (1000 UNIT) tablet Take 1,000 Units by mouth daily.   Yes [provider]  DULoxetine (CYMBALTA) 60 MG capsule Take 60 mg by mouth 2 (two) times daily. 02/26/19  Yes [provider]  fenofibrate (TRICOR) 48 MG tablet Take 48 mg by mouth at bedtime.   Yes [provider]  ferrous sulfate 325 (65 FE) MG tablet Take 325 mg by mouth daily.   Yes [provider]  fluconazole (DIFLUCAN) 100 MG tablet Take 100 mg by mouth daily. 02/12/20  Yes [provider]  furosemide (LASIX) 40 MG tablet Take 1 tablet by mouth twice daily Patient taking differently: Take 40 mg by mouth daily.  01/08/20  Yes Richardo Priest, MD  gabapentin (NEURONTIN) 400 MG capsule Take 400 mg by mouth 2 (two) times daily.  03/02/20  Yes [provider]  meclizine (ANTIVERT) 12.5 MG tablet Take 1 tablet (12.5 mg total) by mouth 3 (three) times daily as needed for dizziness. 12/29/19  Yes Antonieta Pert, MD  Menthol, Topical Analgesic, (ICY HOT ADVANCED RELIEF EX) Apply 1 application topically 3 (three) times daily as needed (pain).    Yes [provider]  metoprolol succinate (TOPROL-XL) 100 MG 24 hr tablet Take 1 tablet (100 mg total) by mouth daily. Take with or immediately following a meal. 10/12/19  Yes Camnitz, Will Hassell Done, MD  pantoprazole (PROTONIX) 40 MG tablet Take 40 mg by mouth 2 (two) times daily.  10/15/19  Yes [provider]  polyethylene glycol (MIRALAX / GLYCOLAX) 17 g packet Take 17 g by mouth at bedtime as needed for moderate constipation.   Yes [provider]  potassium chloride SA (KLOR-CON) 20 MEQ tablet Take 1 tablet (20 mEq total) by mouth 2 (two) times daily. 02/23/20  Yes Dorie Rank, MD  promethazine (PHENERGAN) 25 MG tablet Take 25 mg by mouth every 6 (six) hours as needed for nausea or vomiting.  02/12/20  Yes [provider]  tetrahydrozoline-zinc  (VISINE-AC) 0.05-0.25 % ophthalmic solution Place 2 drops into both eyes 3 (three) times daily as needed (dry eyes).   Yes [provider]  traMADol (ULTRAM) 50 MG tablet Take 50 mg by mouth 4 (four) times daily as needed for moderate pain or severe pain.  02/12/20  Yes [provider]  losartan (COZAAR) 50 MG tablet Take 50 mg by mouth daily. Patient not taking: Reported on 03/04/2020    [provider]    Physical Exam: Vitals:   03/05/20 0330 03/05/20 0401 03/05/20 0448 03/05/20 0545  BP: (!) 121/100 (!) 126/108  (!) 107/54  Pulse: (!) 105 Marland Kitchen)  52 73   Resp: 14 14 19    Temp:      TempSrc:      SpO2: 100% 98% 96%   Weight:      Height:        Constitutional: NAD, calm  Eyes: PERTLA, lids and conjunctivae normal ENMT: Mucous membranes are moist. Posterior pharynx clear of any exudate or lesions.   Neck: normal, supple, no masses, no thyromegaly Respiratory: no wheezing, no crackles. No accessory muscle use.  Cardiovascular: S1 & S2 heard, regular rate and rhythm. No extremity edema.   Abdomen: No distension, no tenderness, soft. Bowel sounds active.  Musculoskeletal: no clubbing / cyanosis. No joint deformity upper and lower extremities.   Skin: ecchymoses, hematomas, and crusted lesions about the extremites and face. Warm, dry, well-perfused. Neurologic: No gross facial weakness, PERRL, EOMI. No aphasia. Sensation to light touch diminished in distal extremities. Distal RUE and proximal RLE strength 3-4/5, otherwise 4-5/5 Psychiatric: Alert and oriented to person, place, and situation. Pleasant and cooperative.    Labs and Imaging on Admission: I have personally reviewed following labs and imaging studies  CBC: Recent Labs  Lab 03/04/20 2247  WBC 8.4  HGB 9.6*  HCT 31.0*  MCV 99.0  PLT 940   Basic Metabolic Panel: Recent Labs  Lab 03/04/20 2247  NA 135  K 4.0  CL 95*  CO2 28  GLUCOSE 119*  BUN 12  CREATININE 1.36*  CALCIUM 9.0    GFR: Estimated Creatinine Clearance: 40.1 mL/min (A) (by C-G formula based on SCr of 1.36 mg/dL (H)). Liver Function Tests: Recent Labs  Lab 03/04/20 2247  AST 76*  ALT 120*  ALKPHOS 126  BILITOT 0.7  PROT 6.6  ALBUMIN 3.7   No results for input(s): LIPASE, AMYLASE in the last 168 hours. Recent Labs  Lab 03/04/20 2247  AMMONIA 19   Coagulation Profile: No results for input(s): INR, PROTIME in the last 168 hours. Cardiac Enzymes: No results for input(s): CKTOTAL, CKMB, CKMBINDEX, TROPONINI in the last 168 hours. BNP (last 3 results) Recent Labs    04/23/19 1233 06/12/19 1705 10/16/19 1020  PROBNP 3,353* 12,215* 449*   HbA1C: No results for input(s): HGBA1C in the last 72 hours. CBG: No results for input(s): GLUCAP in the last 168 hours. Lipid Profile: No results for input(s): CHOL, HDL, LDLCALC, TRIG, CHOLHDL, LDLDIRECT in the last 72 hours. Thyroid Function Tests: No results for input(s): TSH, T4TOTAL, FREET4, T3FREE, THYROIDAB in the last 72 hours. Anemia Panel: No results for input(s): VITAMINB12, FOLATE, FERRITIN, TIBC, IRON, RETICCTPCT in the last 72 hours. Urine analysis:    Component Value Date/Time   COLORURINE STRAW (A) 03/04/2020 2210   APPEARANCEUR CLEAR 03/04/2020 2210   LABSPEC 1.003 (L) 03/04/2020 2210   PHURINE 7.0 03/04/2020 2210   GLUCOSEU NEGATIVE 03/04/2020 2210   HGBUR NEGATIVE 03/04/2020 2210   BILIRUBINUR NEGATIVE 03/04/2020 2210   KETONESUR NEGATIVE 03/04/2020 2210   PROTEINUR NEGATIVE 03/04/2020 2210   NITRITE NEGATIVE 03/04/2020 2210   LEUKOCYTESUR NEGATIVE 03/04/2020 2210   Sepsis Labs: @LABRCNTIP (procalcitonin:4,lacticidven:4) )No results found for this or any previous visit (from the past 240 hour(s)).   Radiological Exams on Admission: CT Head Wo Contrast  Result Date: 03/04/2020 CLINICAL DATA:  Right leg weakness, numbness, and tingling. Speech difficulty. EXAM: CT HEAD WITHOUT CONTRAST TECHNIQUE: Contiguous axial images  were obtained from the base of the skull through the vertex without intravenous contrast. COMPARISON:  02/23/2020 FINDINGS: Brain: Scattered hypodensities within the periventricular white matter compatible  with stable chronic small vessel ischemic changes. No signs of acute infarct or hemorrhage. Lateral ventricles and midline structures are unremarkable. No acute extra-axial fluid collections. No mass effect. Vascular: No hyperdense vessel or unexpected calcification. Skull: Normal. Negative for fracture or focal lesion. Sinuses/Orbits: No acute finding. Other: None. IMPRESSION: 1. Stable head CT, no acute process. Electronically Signed   By: Randa Ngo M.D.   On: 03/04/2020 23:35   MR BRAIN WO CONTRAST  Result Date: 03/05/2020 CLINICAL DATA:  Initial evaluation for acute neuro deficit, stroke suspected. EXAM: MRI HEAD WITHOUT CONTRAST TECHNIQUE: Multiplanar, multiecho pulse sequences of the brain and surrounding structures were obtained without intravenous contrast. COMPARISON:  Prior head CT from 03/04/2020. FINDINGS: Brain: Examination moderately degraded by motion artifact. Cerebral volume within normal limits for age. Patchy T2/FLAIR hyperintensity within the periventricular deep white matter both cerebral hemispheres most consistent with chronic small vessel ischemic disease, mild to moderate in nature. No abnormal foci of restricted diffusion to suggest acute or subacute ischemia. Gray-white matter differentiation maintained. No encephalomalacia to suggest chronic cortical infarction. No acute or chronic intracranial hemorrhage. No mass lesion, midline shift or mass effect. No hydrocephalus or extra-axial fluid collection. Pituitary gland suprasellar region within normal limits. Midline structures intact. Vascular: Major intracranial vascular flow voids are maintained. Skull and upper cervical spine: Craniocervical junction within normal limits. Upper cervical spine normal. Bone marrow signal  intensity within normal limits. No scalp soft tissue abnormality. Sinuses/Orbits: Globes and orbital soft tissues within normal limits. Paranasal sinuses are clear. Small bilateral mastoid effusions noted, of doubtful significance. Other: None. IMPRESSION: 1. No acute intracranial abnormality. 2. Mild to moderate chronic microvascular ischemic disease. Electronically Signed   By: Jeannine Boga M.D.   On: 03/05/2020 03:41   DG Chest Portable 1 View  Result Date: 03/04/2020 CLINICAL DATA:  Speech difficulty EXAM: PORTABLE CHEST 1 VIEW COMPARISON:  12/22/2019 FINDINGS: The heart size and mediastinal contours are within normal limits. Both lungs are clear. The visualized skeletal structures are unremarkable. IMPRESSION: No active disease. Electronically Signed   By: Randa Ngo M.D.   On: 03/04/2020 23:23    EKG: Independently reviewed. Undetermined rhythm.   Assessment/Plan   1. Encephalopathy, paresthesias, focal weakness, convulsions  - Presents with 3 days of bilateral lower leg paresthesias, one day right hand weakness, reports generalized convulsions 3 days ago, and 2 years of limb jerking  - MRI brain is negative for acute findings  - Neurology consulting and much appreciated  - Hold Ultram and Wellbutrin, consult PT and OT, culture if febrile, and check EEG, TSH, ammonia, B12, folate, RPR, and urine porphyrins and PBG    2. Atrial fibrillation  - CHADS-VASc is at least 3 (gender, CHF, HTN)  - Hold Eliquis initially while obtaining above neuro workup should she need LP  - Continue metoprolol    3. CKD IIIb  - SCr is 1.36 on admission, similar to priors  - Renally-dose medications, monitor   4. NICM  - EF was 20-25% in September 2020  - Appears compensated, continue Lasix and beta-blocker, monitor weight and I/O    5. Depression, anxiety  - Hold Wellbutrin given recent convulsions, continue Cymbalta and as-needed Xanax    6. Elevated transaminases  - AST is 76 and ALT  120 with normal alk phos and bili  - Check viral hepatitis panel, repeat CMP tomorrow   7. Hypertension  - Continue metoprolol and Lasix    DVT prophylaxis: Eliquis pta, held initially  Code  Status: Full  Family Communication: Discussed with patient  Disposition Plan:  Patient is from: Home  Anticipated d/c is to: TBD Anticipated d/c date is: Possibly as early as 03/06/20 Patient currently: Pending additional lab workup, EEG, therapy assessments   Consults called: Neurology  Admission status: observation     Vianne Bulls, MD [facility]  03/05/2020, 6:39 AM

## 2020-03-05 NOTE — ED Notes (Signed)
Pt transported to MRI 

## 2020-03-05 NOTE — Consult Note (Signed)
NEURO HOSPITALIST CONSULT NOTE   Requestig physician: Dr. Dayna Barker  Reason for Consult: Seizure, encephalopathy, bilateral foot paresthesias, right hand paresthesias and right sided weakness on generalized weakness.  History obtained from:  Patient and Chart    HPI:                                                                                                                                          Brianna Porter is a 59 y.o. female presenting with multiple somatic and neurological complaints in the context of a complex medical history and failure to thrive. She was seen in th eED on 7/6 with generalized weakness and falls, was diagnosed with hypokalemia and discharged home on potassium supplementation. She re-presented on Friday night via EMS with a chief complaint of RLE weakness, numbness and tingling. LKN was 1 AM on Thursday. Since then she has had weakness and numbness in right leg and is now starting to have numbness in the left leg. She also complains of double vision and 9/10 chronic shoulder pain due to recent falls. She is centrally obese with decreased muscle bulk x 4. Also noted are multiple ecchymoses and sores as well as dressings in place.   On further interview, she clarifies the following neurological symptoms: -- Bilateral foot paresthesias of about 3 days' duration, worse on the right -- Right hand paresthesias of about 3 days' duration -- Right hand clumsiness and weakness of about 1 day's duration -- RLE weakness of about 1 day's duration -- Intermittent jerking of individual limbs for the past 2 years -- A convulsion about 2 years ago in [place]. She was not started on an anticonvulsant for this. She had the second such spell of her lifetime 2-3 days ago when she was witnessed to be convulsing in her wheelchair by a relative. There was LOC and the episode lasted for about 5 minutes. The patient states that EMS was called at that time, but that she  refused to be transported to the ED  Past Medical History:  Diagnosis Date  . Acute gastric ulcer with hemorrhage   . Acute systolic heart failure ([place]) 05/07/2019  . AKI (acute kidney injury) ([place]) 05/07/2019  . Altered mental status 05/07/2019  . Anemia 04/23/2019  . Anxiety and depression   . Atrial fibrillation with rapid ventricular response ([place]) 04/29/2019  . Atrial flutter ([place])   . CAD (coronary artery disease)   . Chronic systolic CHF (congestive heart failure) (H[facility])    a. dx in setting of atrial fib/flutter - possibly tachy mediated. Coronary CTA with only mild CAD in 04/2019.  Marland Kitchen CKD (chronic kidney disease) stage 3, GFR 30-59 ml/min 04/23/2019  . CKD (chronic kidney disease), stage II   . Confusion    a. persistent confusion during  04/2019 admission of unclear cause. Home meds adjusted. CT/MRI brain nonacute.  . Depression 03/02/2019  . Diabetes ([place])   . Edema   . Elevated liver function tests   . Essential hypertension   . Fall 12/23/2019  . Gastrointestinal hemorrhage   . Hypercholesteremia   . Hyperkalemia   . Hyperlipidemia 04/23/2019  . Hypertension   . Hypertensive heart and chronic kidney disease with systolic congestive heart failure ([place])   . Hypertensive heart disease 03/02/2019  . Hyperthyroidism   . Hypokalemia   . Hypokalemia   . Hypomagnesemia   . Hyponatremia   . Iron deficiency anemia   . Mild CAD    a. Coronary CT 04/2019 - Minimal, Non-obstructive CAD.  Marland Kitchen NICM (nonischemic cardiomyopathy) ([place])   . Osteoarthritis 03/02/2019  . Persistent atrial fibrillation ([place])   . Pressure injury of skin 07/09/2019  . Prolonged Q-T interval on ECG 04/30/2019  . Prolonged QT interval   . Symptomatic anemia 06/28/2019  . Type 2 diabetes mellitus with complication, without long-term current use of insulin ([place]) 03/02/2019    Past Surgical History:  Procedure Laterality Date  . BIOPSY  06/29/2019   Procedure: BIOPSY;  Surgeon: Irene Shipper, MD;  Location: [place];   Service: Endoscopy;;  . CARDIOVERSION N/A 04/29/2019   Procedure: CARDIOVERSION;  Surgeon: Sanda Klein, MD;  Location: [facility];  Service: Cardiovascular;  Laterality: N/A;  . CARDIOVERSION N/A 05/04/2019   Procedure: CARDIOVERSION;  Surgeon: Josue Hector, MD;  Location: [facility];  Service: Cardiovascular;  Laterality: N/A;  . COLONOSCOPY WITH PROPOFOL N/A 07/12/2019   Procedure: COLONOSCOPY WITH PROPOFOL;  Surgeon: Doran Stabler, MD;  Location: [facility];  Service: Gastroenterology;  Laterality: N/A;  . ENTEROSCOPY N/A 07/10/2019   Procedure: ENTEROSCOPY;  Surgeon: Doran Stabler, MD;  Location: [facility];  Service: Gastroenterology;  Laterality: N/A;  . ESOPHAGOGASTRODUODENOSCOPY  06/29/2019  . ESOPHAGOGASTRODUODENOSCOPY (EGD) WITH PROPOFOL N/A 06/29/2019   Procedure: ESOPHAGOGASTRODUODENOSCOPY (EGD) WITH PROPOFOL;  Surgeon: Irene Shipper, MD;  Location: [facility] ENDOSCOPY;  Service: Endoscopy;  Laterality: N/A;  . GIVENS CAPSULE STUDY N/A 07/12/2019   Procedure: GIVENS CAPSULE STUDY;  Surgeon: Doran Stabler, MD;  Location: [place];  Service: Gastroenterology;  Laterality: N/A;  . HEMOSTASIS CLIP PLACEMENT  07/12/2019   Procedure: HEMOSTASIS CLIP PLACEMENT;  Surgeon: Doran Stabler, MD;  Location: [place];  Service: Gastroenterology;;  . POLYPECTOMY  07/12/2019   Procedure: POLYPECTOMY;  Surgeon: Doran Stabler, MD;  Location: [place];  Service: Gastroenterology;;  . SMALL BOWEL ENTEROSCOPY  07/10/2019  . SUBMUCOSAL TATTOO INJECTION  07/10/2019   Procedure: SUBMUCOSAL TATTOO INJECTION;  Surgeon: Doran Stabler, MD;  Location: [facility] ENDOSCOPY;  Service: Gastroenterology;;  . TEE WITHOUT CARDIOVERSION  04/29/2019  . TEE WITHOUT CARDIOVERSION N/A 04/29/2019   Procedure: TRANSESOPHAGEAL ECHOCARDIOGRAM (TEE);  Surgeon: Sanda Klein, MD;  Location: [facility] ENDOSCOPY;  Service: Cardiovascular;  Laterality: N/A;  . TUBAL LIGATION      Family History   Problem Relation Age of Onset  . Cancer Mother   . Heart disease Mother   . Hypercholesterolemia Mother   . Osteoarthritis Mother   . Stroke Mother   . Heart disease Father   . Hypercholesterolemia Brother              Social History:  reports that she quit smoking about 20 years ago. Her smoking use included cigarettes. She has a 60.00 pack-year smoking history. She has never  used smokeless tobacco. She reports that she does not drink alcohol and does not use drugs.  Allergies  Allergen Reactions  . Ambien [Zolpidem Tartrate]     Pt and family state this med makes the patient sleepwalk.  . Adhesive [Tape]     Tears skin, please use paper tape  . Codeine Nausea And Vomiting    HOME MEDICATIONS:                                                                                                                      No current facility-administered medications on file prior to encounter.   Current Outpatient Medications on File Prior to Encounter  Medication Sig Dispense Refill  . acetaminophen (TYLENOL) 325 MG tablet Take 650 mg by mouth every 6 (six) hours as needed for mild pain.    Marland Kitchen albuterol (VENTOLIN HFA) 108 (90 Base) MCG/ACT inhaler Inhale 2 puffs into the lungs every 6 (six) hours as needed for wheezing or shortness of breath.    . ALPRAZolam (XANAX) 0.5 MG tablet Take 0.5 mg by mouth 4 (four) times daily as needed for anxiety or sleep.     Marland Kitchen amiodarone (PACERONE) 200 MG tablet Take 1 tablet (200 mg total) by mouth daily. (Patient taking differently: Take 200 mg by mouth 2 (two) times daily. ) 90 tablet 1  . apixaban (ELIQUIS) 5 MG TABS tablet Take 1 tablet (5 mg total) by mouth 2 (two) times daily. 60 tablet 5  . Ascorbic Acid (VITAMIN C) 1000 MG tablet Take 1,000 mg by mouth daily.    Marland Kitchen buPROPion (WELLBUTRIN XL) 150 MG 24 hr tablet Take 150 mg by mouth daily.    . cetirizine (ZYRTEC) 10 MG tablet Take 10 mg by mouth daily as needed for allergies.    . cholecalciferol  (VITAMIN D3) 25 MCG (1000 UNIT) tablet Take 1,000 Units by mouth daily.    . DULoxetine (CYMBALTA) 60 MG capsule Take 60 mg by mouth 2 (two) times daily.    . fenofibrate (TRICOR) 48 MG tablet Take 48 mg by mouth at bedtime.    . ferrous sulfate 325 (65 FE) MG tablet Take 325 mg by mouth daily.    . fluconazole (DIFLUCAN) 100 MG tablet Take 100 mg by mouth daily.    . furosemide (LASIX) 40 MG tablet Take 1 tablet by mouth twice daily (Patient taking differently: Take 40 mg by mouth daily. ) 60 tablet 3  . gabapentin (NEURONTIN) 400 MG capsule Take 400 mg by mouth 2 (two) times daily.     . meclizine (ANTIVERT) 12.5 MG tablet Take 1 tablet (12.5 mg total) by mouth 3 (three) times daily as needed for dizziness. 30 tablet 0  . Menthol, Topical Analgesic, (ICY HOT ADVANCED RELIEF EX) Apply 1 application topically 3 (three) times daily as needed (pain).     . metoprolol succinate (TOPROL-XL) 100 MG 24 hr tablet Take 1 tablet (100 mg total) by mouth daily. Take with or immediately following a meal.  90 tablet 1  . pantoprazole (PROTONIX) 40 MG tablet Take 40 mg by mouth 2 (two) times daily.     . polyethylene glycol (MIRALAX / GLYCOLAX) 17 g packet Take 17 g by mouth at bedtime as needed for moderate constipation.    . potassium chloride SA (KLOR-CON) 20 MEQ tablet Take 1 tablet (20 mEq total) by mouth 2 (two) times daily. 14 tablet 0  . promethazine (PHENERGAN) 25 MG tablet Take 25 mg by mouth every 6 (six) hours as needed for nausea or vomiting.     Marland Kitchen tetrahydrozoline-zinc (VISINE-AC) 0.05-0.25 % ophthalmic solution Place 2 drops into both eyes 3 (three) times daily as needed (dry eyes).    . traMADol (ULTRAM) 50 MG tablet Take 50 mg by mouth 4 (four) times daily as needed for moderate pain or severe pain.     Marland Kitchen losartan (COZAAR) 50 MG tablet Take 50 mg by mouth daily. (Patient not taking: Reported on 03/04/2020)      ROS:                                                                                                                                        As per HPI.    Blood pressure (!) 154/97, pulse 81, temperature 98.2 F (36.8 C), temperature source Oral, resp. rate 14, height 5\' 2"  (1.575 m), weight 65.8 kg, SpO2 95 %.   General Examination:                                                                                                       Physical Exam  HEENT-  Fate/AT    Lungs- Respirations unlabored Extremities- Multiple ecchymoses noted  Neurological Examination Mental Status: Awake and alert. Normal attention. Speech fluent with intact comprehension. Able to follow all commands. Good insight. Good recall for recent events. Pleasant and cooperative. No agitation noted. No dysarthria.  Cranial Nerves: II: Visual fields intact with no extinction to DSS. PERRL.  III,IV, VI: No ptosis. EOMI without nystagmus.   V,VII: Smile symmetric. Decreased temp sensation to right side of face.  VIII: Hearing intact to voice IX,X: No hypophonia.  XI: Head is midline XII: Midline tongue extension Motor: RUE 4/5 proximally and distally LUE: 4/5 proximally and distally, except for shoulder abduction and extension which are with limited movement due to pain. RLE: Unable to elevate antigravity at hip (2/5). Knee extension is 4-/5. ADF/APF 4-/5 LLE 4-/5 proximally and distally   Intermittent low-amplitude myoclonic jerks are  present at rest in all 4 limbs nonsynchronously, which worsen with movement and when limbs are held antigravity. Jerks are more prominent distally (wrists and ankles) than proximally.  Significantly decreased muscle bulk in all 4 limbs proximally and distally  Sensory: Patchy sensory loss to temp and FT in all 4 limbs. No extinction to DSS.  Deep Tendon Reflexes: 1+ bilateral biceps and brachioradialis. Trace patellar reflexes bilaterally. 0 achilles bilaterally. Toes mute bilaterally.  Cerebellar: No ataxia with FNF bilaterally. There is action tremor as well as increased  low-amplitude myoclonic jerking with movement.  Gait: Unable to assess due to weakness   Lab Results: Basic Metabolic Panel: Recent Labs  Lab 03/04/20 2247  NA 135  K 4.0  CL 95*  CO2 28  GLUCOSE 119*  BUN 12  CREATININE 1.36*  CALCIUM 9.0    CBC: Recent Labs  Lab 03/04/20 2247  WBC 8.4  HGB 9.6*  HCT 31.0*  MCV 99.0  PLT 315    Cardiac Enzymes: No results for input(s): CKTOTAL, CKMB, CKMBINDEX, TROPONINI in the last 168 hours.  Lipid Panel: No results for input(s): CHOL, TRIG, HDL, CHOLHDL, VLDL, LDLCALC in the last 168 hours.  Imaging: CT Head Wo Contrast  Result Date: 03/04/2020 CLINICAL DATA:  Right leg weakness, numbness, and tingling. Speech difficulty. EXAM: CT HEAD WITHOUT CONTRAST TECHNIQUE: Contiguous axial images were obtained from the base of the skull through the vertex without intravenous contrast. COMPARISON:  02/23/2020 FINDINGS: Brain: Scattered hypodensities within the periventricular white matter compatible with stable chronic small vessel ischemic changes. No signs of acute infarct or hemorrhage. Lateral ventricles and midline structures are unremarkable. No acute extra-axial fluid collections. No mass effect. Vascular: No hyperdense vessel or unexpected calcification. Skull: Normal. Negative for fracture or focal lesion. Sinuses/Orbits: No acute finding. Other: None. IMPRESSION: 1. Stable head CT, no acute process. Electronically Signed   By: Randa Ngo M.D.   On: 03/04/2020 23:35   MR BRAIN WO CONTRAST  Result Date: 03/05/2020 CLINICAL DATA:  Initial evaluation for acute neuro deficit, stroke suspected. EXAM: MRI HEAD WITHOUT CONTRAST TECHNIQUE: Multiplanar, multiecho pulse sequences of the brain and surrounding structures were obtained without intravenous contrast. COMPARISON:  Prior head CT from 03/04/2020. FINDINGS: Brain: Examination moderately degraded by motion artifact. Cerebral volume within normal limits for age. Patchy T2/FLAIR  hyperintensity within the periventricular deep white matter both cerebral hemispheres most consistent with chronic small vessel ischemic disease, mild to moderate in nature. No abnormal foci of restricted diffusion to suggest acute or subacute ischemia. Gray-white matter differentiation maintained. No encephalomalacia to suggest chronic cortical infarction. No acute or chronic intracranial hemorrhage. No mass lesion, midline shift or mass effect. No hydrocephalus or extra-axial fluid collection. Pituitary gland suprasellar region within normal limits. Midline structures intact. Vascular: Major intracranial vascular flow voids are maintained. Skull and upper cervical spine: Craniocervical junction within normal limits. Upper cervical spine normal. Bone marrow signal intensity within normal limits. No scalp soft tissue abnormality. Sinuses/Orbits: Globes and orbital soft tissues within normal limits. Paranasal sinuses are clear. Small bilateral mastoid effusions noted, of doubtful significance. Other: None. IMPRESSION: 1. No acute intracranial abnormality. 2. Mild to moderate chronic microvascular ischemic disease. Electronically Signed   By: Jeannine Boga M.D.   On: 03/05/2020 03:41   DG Chest Portable 1 View  Result Date: 03/04/2020 CLINICAL DATA:  Speech difficulty EXAM: PORTABLE CHEST 1 VIEW COMPARISON:  12/22/2019 FINDINGS: The heart size and mediastinal contours are within normal limits. Both lungs are clear. The  visualized skeletal structures are unremarkable. IMPRESSION: No active disease. Electronically Signed   By: Randa Ngo M.D.   On: 03/04/2020 23:23    Assessment: 59 y.o. female presenting with multiple somatic and neurological complaints in the context of a complex medical history and failure to thrive. She was seen in the ED on 7/6 with generalized weakness and falls, was diagnosed with hypokalemia and discharged home on potassium supplementation. She re-presented on Friday night via  EMS with a chief complaint of RLE weakness, numbness and tingling. LKN was 1 AM on Thursday. Since then she has had weakness and numbness in right leg and is now starting to have numbness in the left leg. She also complains of double vision and 9/10 chronic shoulder pain due to recent falls. She is centrally obese with decreased muscle bulk x 4. Also noted are multiple ecchymoses and sores as well as dressings in place. On further interview, she clarifies the following neurological symptoms: -- Bilateral foot paresthesias of about 3 days' duration, worse on the right -- Right hand paresthesias of about 3 days' duration -- Right hand clumsiness and weakness of about 1 day's duration -- RLE weakness of about 1 day's duration -- Intermittent jerking of individual limbs for the past 2 years -- A convulsion about 2 years ago in [place]. She was not started on an anticonvulsant for this. She had the second such spell of her lifetime 2-3 days ago when she was witnessed to be convulsing in her wheelchair by a relative. There was LOC and the episode lasted for about 5 minutes. The patient states that EMS was called at that time, but that she refused to be transported to the ED 1. Exam with patchy sensory deficits and diffuse weakness. Her RLE is weaker than the left, being unable to lift antigravity. She appears to suffer from severe deconditioning and failure to thrive.  2. MRI brain: No acute intracranial abnormality. Mild to moderate chronic microvascular ischemic disease. 3. Regarding her weakness, stroke has been ruled out by MRI. Myelopathy and polyradiculopathy are on the DDx, as well as a myopathy and asymmetric findings on generalized weakness possibly due to pain from recent falls.  4. Regarding her recent seizure-like spell and the convulsion she describes from 2 years ago, these may be etiologically related to the positive and negative myoclonus seen on today's exam. DDx includes metabolic disturbance  resulting in seizure and asterixis/myoclonus.  5. Regarding her distal paresthesias, and underlying small fiber or length-dependent neuropathy are on the DDx. GBS is also possible and could also explain her motor weakness, but weak reflexes are present.   6. Ammonia level normal.   Recommendations: 1. EEG (ordered) 2. Toxic/metabolic work up 3. MRI of cervical, thoracic and lumbar spine (ordered) 4. PT/OT 5. Hold Wellbutrin and Ultram for now. Both of these medications can lower the seizure threshold.  6. B12 and TSH level   Electronically signed: Dr. Kerney Elbe 03/05/2020, 3:47 AM

## 2020-03-05 NOTE — Progress Notes (Signed)
Patient was seen and examined.  Admitted by nighttime hospitalist early morning hours with extensive neurological problem including asymmetrical weakness right more than left, progressively wheelchair-bound with multiple fall.  Also with extensive medical issues.  She was resting comfortably in the bed, ready to go for his final MRIs and was hungry.  See details for H&P done early morning. Her main issues were bilateral foot paresthesia, right hand and leg paresthesia and clumsiness, intermittent jerking of the hands, reportedly convulsive episode 1 2 years ago and one 3 days ago.  Underlying cardiac history with paroxysmal A. fib and on amiodarone and Eliquis.  Also on Wellbutrin and tramadol. Followed by neurology. MRI brain normal.  MRI C-spine, T-spine and L-spine ordered and currently undergoing investigations.  Eliquis on hold in case patient needs a spinal tap. Fairly hemodynamically stable. EEG, undergoing Multiple serological tests undergoing.  Patient presented with acute neurological issues including asymmetrical paresthesia.  She will need hospitalization, monitoring, different investigations and therapeutics.  Anticipate hospitalization more than 2 midnights given severity of symptoms and need for inpatient treatment.

## 2020-03-05 NOTE — ED Notes (Signed)
EEG at bedside.

## 2020-03-05 NOTE — ED Notes (Signed)
Pt remains at MRI

## 2020-03-05 NOTE — Procedures (Signed)
Patient Name: Brianna Porter  MRN: [id number]  Epilepsy Attending: Lora Havens  Referring Physician/Provider: Dr Mitzi Hansen Date: 03/05/2020  Duration: 27.29 mins  Patient history: 59yo F presenting wiyh paresthesias and intermittent limb jerking. EEG to evaluate for seizure  Level of alertness: Awake, asleep  AEDs during EEG study: Gabapentin  Technical aspects: This EEG study was done with scalp electrodes positioned according to the 10-20 International system of electrode placement. Electrical activity was acquired at a sampling rate of 500Hz  and reviewed with a high frequency filter of 70Hz  and a low frequency filter of 1Hz . EEG data were recorded continuously and digitally stored.   Description: The posterior dominant rhythm consists of 10 Hz activity of moderate voltage (25-35 uV) seen predominantly in posterior head regions, symmetric and reactive to eye opening and eye closing. Sleep was characterized by vertex waves, sleep spindles (12 to 14 Hz), maximal frontocentral region.  Hyperventilation did not show any EEG change. Photic stimulation was not performed.     IMPRESSION: This study is within normal limits. No seizures or epileptiform discharges were seen throughout the recording.   Brianna Porter

## 2020-03-05 NOTE — Progress Notes (Signed)
EEG complete - results pending 

## 2020-03-06 DIAGNOSIS — T426X4A Poisoning by other antiepileptic and sedative-hypnotic drugs, undetermined, initial encounter: Secondary | ICD-10-CM

## 2020-03-06 DIAGNOSIS — I5022 Chronic systolic (congestive) heart failure: Secondary | ICD-10-CM | POA: Diagnosis not present

## 2020-03-06 DIAGNOSIS — I2510 Atherosclerotic heart disease of native coronary artery without angina pectoris: Secondary | ICD-10-CM | POA: Diagnosis present

## 2020-03-06 DIAGNOSIS — I130 Hypertensive heart and chronic kidney disease with heart failure and stage 1 through stage 4 chronic kidney disease, or unspecified chronic kidney disease: Secondary | ICD-10-CM | POA: Diagnosis not present

## 2020-03-06 DIAGNOSIS — D509 Iron deficiency anemia, unspecified: Secondary | ICD-10-CM | POA: Diagnosis not present

## 2020-03-06 DIAGNOSIS — E871 Hypo-osmolality and hyponatremia: Secondary | ICD-10-CM | POA: Diagnosis not present

## 2020-03-06 DIAGNOSIS — M65812 Other synovitis and tenosynovitis, left shoulder: Secondary | ICD-10-CM | POA: Diagnosis not present

## 2020-03-06 DIAGNOSIS — G9341 Metabolic encephalopathy: Secondary | ICD-10-CM | POA: Diagnosis not present

## 2020-03-06 DIAGNOSIS — Z20822 Contact with and (suspected) exposure to covid-19: Secondary | ICD-10-CM | POA: Diagnosis not present

## 2020-03-06 DIAGNOSIS — I10 Essential (primary) hypertension: Secondary | ICD-10-CM | POA: Diagnosis not present

## 2020-03-06 DIAGNOSIS — M75122 Complete rotator cuff tear or rupture of left shoulder, not specified as traumatic: Secondary | ICD-10-CM | POA: Diagnosis not present

## 2020-03-06 DIAGNOSIS — M2550 Pain in unspecified joint: Secondary | ICD-10-CM | POA: Diagnosis not present

## 2020-03-06 DIAGNOSIS — M47816 Spondylosis without myelopathy or radiculopathy, lumbar region: Secondary | ICD-10-CM | POA: Diagnosis present

## 2020-03-06 DIAGNOSIS — R531 Weakness: Secondary | ICD-10-CM | POA: Diagnosis not present

## 2020-03-06 DIAGNOSIS — M1711 Unilateral primary osteoarthritis, right knee: Secondary | ICD-10-CM | POA: Diagnosis not present

## 2020-03-06 DIAGNOSIS — I482 Chronic atrial fibrillation, unspecified: Secondary | ICD-10-CM | POA: Diagnosis not present

## 2020-03-06 DIAGNOSIS — M7552 Bursitis of left shoulder: Secondary | ICD-10-CM | POA: Diagnosis not present

## 2020-03-06 DIAGNOSIS — Z7401 Bed confinement status: Secondary | ICD-10-CM | POA: Diagnosis not present

## 2020-03-06 DIAGNOSIS — E039 Hypothyroidism, unspecified: Secondary | ICD-10-CM | POA: Diagnosis not present

## 2020-03-06 DIAGNOSIS — M25512 Pain in left shoulder: Secondary | ICD-10-CM | POA: Diagnosis not present

## 2020-03-06 DIAGNOSIS — I480 Paroxysmal atrial fibrillation: Secondary | ICD-10-CM | POA: Diagnosis not present

## 2020-03-06 DIAGNOSIS — Z885 Allergy status to narcotic agent status: Secondary | ICD-10-CM | POA: Diagnosis not present

## 2020-03-06 DIAGNOSIS — Z888 Allergy status to other drugs, medicaments and biological substances status: Secondary | ICD-10-CM | POA: Diagnosis not present

## 2020-03-06 DIAGNOSIS — T462X1D Poisoning by other antidysrhythmic drugs, accidental (unintentional), subsequent encounter: Secondary | ICD-10-CM | POA: Diagnosis not present

## 2020-03-06 DIAGNOSIS — E038 Other specified hypothyroidism: Secondary | ICD-10-CM | POA: Diagnosis not present

## 2020-03-06 DIAGNOSIS — R7989 Other specified abnormal findings of blood chemistry: Secondary | ICD-10-CM | POA: Diagnosis not present

## 2020-03-06 DIAGNOSIS — F329 Major depressive disorder, single episode, unspecified: Secondary | ICD-10-CM | POA: Diagnosis present

## 2020-03-06 DIAGNOSIS — I429 Cardiomyopathy, unspecified: Secondary | ICD-10-CM | POA: Diagnosis not present

## 2020-03-06 DIAGNOSIS — I4819 Other persistent atrial fibrillation: Secondary | ICD-10-CM | POA: Diagnosis not present

## 2020-03-06 DIAGNOSIS — R569 Unspecified convulsions: Secondary | ICD-10-CM | POA: Diagnosis not present

## 2020-03-06 DIAGNOSIS — N1832 Chronic kidney disease, stage 3b: Secondary | ICD-10-CM | POA: Diagnosis not present

## 2020-03-06 DIAGNOSIS — D631 Anemia in chronic kidney disease: Secondary | ICD-10-CM | POA: Diagnosis present

## 2020-03-06 DIAGNOSIS — E860 Dehydration: Secondary | ICD-10-CM | POA: Diagnosis present

## 2020-03-06 DIAGNOSIS — K7689 Other specified diseases of liver: Secondary | ICD-10-CM | POA: Diagnosis not present

## 2020-03-06 DIAGNOSIS — G9340 Encephalopathy, unspecified: Secondary | ICD-10-CM | POA: Diagnosis not present

## 2020-03-06 DIAGNOSIS — R627 Adult failure to thrive: Secondary | ICD-10-CM | POA: Diagnosis not present

## 2020-03-06 DIAGNOSIS — I428 Other cardiomyopathies: Secondary | ICD-10-CM | POA: Diagnosis not present

## 2020-03-06 DIAGNOSIS — M25412 Effusion, left shoulder: Secondary | ICD-10-CM | POA: Diagnosis not present

## 2020-03-06 DIAGNOSIS — R4701 Aphasia: Secondary | ICD-10-CM | POA: Diagnosis not present

## 2020-03-06 DIAGNOSIS — Z91048 Other nonmedicinal substance allergy status: Secondary | ICD-10-CM | POA: Diagnosis not present

## 2020-03-06 DIAGNOSIS — F419 Anxiety disorder, unspecified: Secondary | ICD-10-CM | POA: Diagnosis present

## 2020-03-06 DIAGNOSIS — Z7901 Long term (current) use of anticoagulants: Secondary | ICD-10-CM | POA: Diagnosis not present

## 2020-03-06 DIAGNOSIS — E2740 Unspecified adrenocortical insufficiency: Secondary | ICD-10-CM | POA: Diagnosis not present

## 2020-03-06 DIAGNOSIS — I5020 Unspecified systolic (congestive) heart failure: Secondary | ICD-10-CM | POA: Diagnosis not present

## 2020-03-06 DIAGNOSIS — R202 Paresthesia of skin: Secondary | ICD-10-CM | POA: Diagnosis not present

## 2020-03-06 HISTORY — DX: Unspecified convulsions: R56.9

## 2020-03-06 LAB — COMPREHENSIVE METABOLIC PANEL
ALT: 94 U/L — ABNORMAL HIGH (ref 0–44)
AST: 60 U/L — ABNORMAL HIGH (ref 15–41)
Albumin: 3.2 g/dL — ABNORMAL LOW (ref 3.5–5.0)
Alkaline Phosphatase: 108 U/L (ref 38–126)
Anion gap: 11 (ref 5–15)
BUN: 14 mg/dL (ref 6–20)
CO2: 26 mmol/L (ref 22–32)
Calcium: 9.5 mg/dL (ref 8.9–10.3)
Chloride: 94 mmol/L — ABNORMAL LOW (ref 98–111)
Creatinine, Ser: 1.52 mg/dL — ABNORMAL HIGH (ref 0.44–1.00)
GFR calc Af Amer: 43 mL/min — ABNORMAL LOW (ref >60)
GFR calc non Af Amer: 37 mL/min — ABNORMAL LOW (ref >60)
Glucose, Bld: 125 mg/dL — ABNORMAL HIGH (ref 70–99)
Potassium: 3.3 mmol/L — ABNORMAL LOW (ref 3.5–5.1)
Sodium: 131 mmol/L — ABNORMAL LOW (ref 135–145)
Total Bilirubin: 0.4 mg/dL (ref 0.3–1.2)
Total Protein: 6.1 g/dL — ABNORMAL LOW (ref 6.5–8.1)

## 2020-03-06 LAB — URINE CULTURE

## 2020-03-06 LAB — CBC
HCT: 28.8 % — ABNORMAL LOW (ref 36.0–46.0)
Hemoglobin: 9 g/dL — ABNORMAL LOW (ref 12.0–15.0)
MCH: 31 pg (ref 26.0–34.0)
MCHC: 31.3 g/dL (ref 30.0–36.0)
MCV: 99.3 fL (ref 80.0–100.0)
Platelets: 320 10*3/uL (ref 150–400)
RBC: 2.9 MIL/uL — ABNORMAL LOW (ref 3.87–5.11)
RDW: 14.1 % (ref 11.5–15.5)
WBC: 7.4 10*3/uL (ref 4.0–10.5)
nRBC: 0 % (ref 0.0–0.2)

## 2020-03-06 LAB — T4, FREE: Free T4: <0.25 ng/dL — ABNORMAL LOW (ref 0.61–1.12)

## 2020-03-06 LAB — TSH: TSH: 61.083 u[IU]/mL — ABNORMAL HIGH (ref 0.350–4.500)

## 2020-03-06 LAB — MAGNESIUM: Magnesium: 1.9 mg/dL (ref 1.7–2.4)

## 2020-03-06 MED ORDER — ACETAMINOPHEN 325 MG PO TABS
650.0000 mg | ORAL_TABLET | Freq: Four times a day (QID) | ORAL | Status: DC | PRN
  Administered 2020-03-08 – 2020-03-15 (×12): 650 mg via ORAL
  Filled 2020-03-06 (×14): qty 2

## 2020-03-06 MED ORDER — NICOTINE 21 MG/24HR TD PT24
21.0000 mg | MEDICATED_PATCH | Freq: Every day | TRANSDERMAL | Status: DC
  Administered 2020-03-06 – 2020-03-18 (×13): 21 mg via TRANSDERMAL
  Filled 2020-03-06 (×13): qty 1

## 2020-03-06 MED ORDER — APIXABAN 5 MG PO TABS
5.0000 mg | ORAL_TABLET | Freq: Two times a day (BID) | ORAL | Status: AC
  Administered 2020-03-06 – 2020-03-12 (×13): 5 mg via ORAL
  Filled 2020-03-06 (×13): qty 1

## 2020-03-06 MED ORDER — OXYCODONE HCL 5 MG PO TABS
5.0000 mg | ORAL_TABLET | Freq: Four times a day (QID) | ORAL | Status: DC | PRN
  Administered 2020-03-06 – 2020-03-18 (×28): 5 mg via ORAL
  Filled 2020-03-06 (×29): qty 1

## 2020-03-06 MED ORDER — BENZOCAINE 10 % MT GEL
Freq: Four times a day (QID) | OROMUCOSAL | Status: DC | PRN
  Filled 2020-03-06 (×2): qty 9

## 2020-03-06 MED ORDER — LEVOTHYROXINE SODIUM 100 MCG PO TABS
100.0000 ug | ORAL_TABLET | Freq: Every day | ORAL | Status: DC
  Administered 2020-03-07 – 2020-03-18 (×12): 100 ug via ORAL
  Filled 2020-03-06 (×12): qty 1

## 2020-03-06 MED ORDER — LEVOTHYROXINE SODIUM 100 MCG/5ML IV SOLN
100.0000 ug | Freq: Once | INTRAVENOUS | Status: AC
  Administered 2020-03-06: 100 ug via INTRAVENOUS
  Filled 2020-03-06: qty 5

## 2020-03-06 MED ORDER — GABAPENTIN 100 MG PO CAPS
100.0000 mg | ORAL_CAPSULE | Freq: Two times a day (BID) | ORAL | Status: DC
  Administered 2020-03-07 – 2020-03-08 (×3): 100 mg via ORAL
  Filled 2020-03-06 (×3): qty 1

## 2020-03-06 NOTE — Discharge Instructions (Signed)

## 2020-03-06 NOTE — Consult Note (Signed)
WOC Nurse Consult Note: Reason for Consult: Numerous full and partial thickness skin lesions with unknown etiology.  Wound type: autoimmune vs infectious vs trauma Pressure Injury POA: N/A Measurement: too numerous to measure each, the largest seen was 0.5cm x 0.8cm x 0.1cm Wound bed: bleeding to red, dry Drainage (amount, consistency, odor) serosanguinous to serous Periwound: ecchymotic Dressing procedure/placement/frequency: These lesions respond to daily application of xeroform gauze, patient reports that they have recently run out of xeroform and have been using dry gauze in its place, which becomes adherent and causes more trauma upon removal.  I have provided Nursing with guidance in the care of these lesions using saline to cleanse, then xeroform antimicrobial nonadherent gauze secured with Kerlix roll gauze except to face where dressing can be secured with a silicone foam.  [facility] nursing team will not follow, but will remain available to this patient, the nursing and medical teams.  Please re-consult if needed. Thanks, Maudie Flakes, MSN, RN, [place], Arther Abbott  Pager# [phone]

## 2020-03-06 NOTE — TOC Initial Note (Signed)
Transition of Care [facility]) - Initial/Assessment Note    Patient Details  Name: Brianna Porter MRN: [id number] Date of Birth: 1961/05/22  Transition of Care [facility]) CM/SW Contact:    Arvella Merles, [place] Phone Number: 03/06/2020, 10:46 AM  Clinical Narrative:                 CSW consulted for PT recommendation of SNF placement at discharge. CSW met with patient to discuss recommendation. Patient expressed she was tired and provided permission for CSW to contact her daughter Brianna Porter.  Patient resides with her daughter and uses a wheelchair primarily. Brianna Porter is in agreement with PT recommendation of SNF placement at discharge. Patient was previously at [facility] and Brianna Porter does not want her to return. CSW explained insurance authorization process and the need for a SNF to accept [name] expressed understanding. Patient is not vaccinated.   Expected Discharge Plan: Skilled Nursing Facility Barriers to Discharge: Continued Medical Work up, Ship broker   Patient Goals and CMS Choice   CMS Medicare.gov Compare Post Acute Care list provided to:: Patient Represenative (must comment) Brianna Porter) Choice offered to / list presented to : Adult Children  Expected Discharge Plan and Services Expected Discharge Plan: [place] In-house Referral: Clinical Social Work     Living arrangements for the past 2 months: [place]                                      Prior Living Arrangements/Services Living arrangements for the past 2 months: Single Family Home Lives with:: Adult Children Patient language and need for interpreter reviewed:: Yes Do you feel safe going back to the place where you live?: Yes      Need for Family Participation in Patient Care: Yes (Comment) Care giver support system in place?: Yes (comment)   Criminal Activity/Legal Involvement Pertinent to Current Situation/Hospitalization: No - Comment as needed  Activities of Daily  Living      Permission Sought/Granted Permission sought to share information with : Facility Sport and exercise psychologist, Family Supports Permission granted to share information with : Yes, Verbal Permission Granted  Share Information with NAME: Brianna Porter  Permission granted to share info w AGENCY: SNFs  Permission granted to share info w Relationship: Daughter  Permission granted to share info w Contact Information: [phone]  Emotional Assessment   Attitude/Demeanor/Rapport: Unable to Assess Affect (typically observed): Unable to Assess Orientation: : Oriented to Self, Oriented to Place, Oriented to  Time, Oriented to Situation Alcohol / Substance Use: Not Applicable Psych Involvement: No (comment)  Admission diagnosis:  Dehydration [E86.0] Weakness [R53.1] Encephalopathy [G93.40] Convulsions ([place]) [R56.9] Patient Active Problem List   Diagnosis Date Noted  . Convulsions ([place]) 03/06/2020  . Encephalopathy 03/05/2020  . Fall 12/23/2019  . Gastrointestinal hemorrhage   . Pressure injury of skin 07/09/2019  . Acute gastric ulcer with hemorrhage   . Symptomatic anemia 06/28/2019  . Elevated liver function tests   . NICM (nonischemic cardiomyopathy) ([place])   . Mild CAD   . Confusion   . Diabetes ([place])   . Iron deficiency anemia   . Hyperthyroidism   . Altered mental status 05/07/2019  . Acute systolic heart failure ([place]) 05/07/2019  . AKI (acute kidney injury) ([place]) 05/07/2019  . Hyponatremia   . CAD (coronary artery disease)   . Essential hypertension   . Hypokalemia   . Prolonged Q-T interval on ECG 04/30/2019  .  Atrial fibrillation with rapid ventricular response ([place]) 04/29/2019  . Persistent atrial fibrillation ([place])   . Hyperlipidemia 04/23/2019  . Anemia 04/23/2019  . CKD (chronic kidney disease) stage 3, GFR 30-59 ml/min 04/23/2019  . Depression 03/02/2019  . Osteoarthritis 03/02/2019  . Type 2 diabetes mellitus with complication, without long-term current use  of insulin ([place]) 03/02/2019  . Hypertensive heart disease 03/02/2019   PCP:  Cher Nakai, MD Pharmacy:   [facility] [place] [address] [place] [address] Phone: [phone] Fax: [phone]  [id number] [place] [place] [address]Phone: [phone]: [phone]    Social Determinants of Health (SDOH) Interventions    Readmission Risk Interventions Readmission Risk Prevention Plan 07/13/2019  Transportation Screening Complete  PCP or Specialist Appt within 3-5 Days Complete  HRI or [place]ise[place]  Social Work Consult for RFort ThomasPlanning/Counseling Complete  Palliative Care Screening Not Applicable  Medication Review (Press photographer Complete

## 2020-03-06 NOTE — Progress Notes (Signed)
Neurology Progress Note   S:// Seen and examined. Reports some improvement in numbness in arm and leg. Noted to be using a gentian violet on her tongue-says she has been using this for years-it was given by her father and is supposed to be on anti-infective.   O:// Current vital signs: BP (!) 148/98   Pulse 62   Temp 98.2 F (36.8 C) (Oral)   Resp 18   Ht 5\' 2"  (1.575 m)   Wt 65.9 kg   LMP  (LMP Unknown) Comment: ?59 years ago?  SpO2 95%   BMI 26.57 kg/m  Vital signs in last 24 hours: Temp:  [97.7 F (36.5 C)-98.2 F (36.8 C)] 98.2 F (36.8 C) (07/18 0854) Pulse Rate:  [55-64] 62 (07/18 1020) Resp:  [18-24] 18 (07/18 0854) BP: (109-165)/(62-98) 148/98 (07/18 1020) SpO2:  [95 %-100 %] 95 % (07/18 0854) Weight:  [65.9 kg] 65.9 kg (07/18 0334) Neurological exam Awake alert oriented No dysarthria No aphasia Cranial: 2-12 intact Motor exam: Right upper extremity 4/5 proximally distally.  Left upper extremity 4/5-shoulder pain limits exam. Right lower extremity 2/5 at the hip, knee flexion extension 4/5. Left lower extremity 4 -/5 all over. Does exhibit low amplitude clonic jerking at rest.  Exacerbated by movement. Sensory exam: Patchy sensory loss to light touch in all 4 limbs with no clear dermatomal distribution. DTRs: 1+ upper extremities.  Absent in lower extremities. Cerebellar: Action tremor and myoclonic jerking noted Extremities have ecchymosis all over.  Gen: Well-developed well-nourished in no distress HEENT: Normocephalic atraumatic, tongue was blue-when asking why, she said that she applies gentian violet on it. CVs: Regular rhythm Respiratory: Breathing well saturating normally on room air.  Medications  Current Facility-Administered Medications:  .  acetaminophen (TYLENOL) tablet 650 mg, 650 mg, Oral, Q6H PRN, Cherene Altes, MD .  ALPRAZolam Duanne Moron) tablet 0.5 mg, 0.5 mg, Oral, QID PRN, Opyd, Ilene Qua, MD .  apixaban (ELIQUIS) tablet 5 mg, 5 mg,  Oral, BID, McClung, Kimberlee Nearing, MD .  benzocaine (ORAJEL) 10 % mucosal gel, , Mouth/Throat, QID PRN, Cherene Altes, MD .  DULoxetine (CYMBALTA) DR capsule 60 mg, 60 mg, Oral, BID, Opyd, Ilene Qua, MD, 60 mg at 03/06/20 1020 .  furosemide (LASIX) tablet 40 mg, 40 mg, Oral, Daily, Opyd, Ilene Qua, MD, 40 mg at 03/06/20 1020 .  gabapentin (NEURONTIN) capsule 400 mg, 400 mg, Oral, BID, Opyd, Ilene Qua, MD, 400 mg at 03/06/20 1020 .  metoprolol succinate (TOPROL-XL) 24 hr tablet 100 mg, 100 mg, Oral, Daily, Opyd, Ilene Qua, MD, 100 mg at 03/06/20 1020 .  oxyCODONE (Oxy IR/ROXICODONE) immediate release tablet 5 mg, 5 mg, Oral, Q6H PRN, Cherene Altes, MD, 5 mg at 03/06/20 1020 .  pantoprazole (PROTONIX) EC tablet 40 mg, 40 mg, Oral, BID, Opyd, Ilene Qua, MD, 40 mg at 03/06/20 1020 .  polyethylene glycol (MIRALAX / GLYCOLAX) packet 17 g, 17 g, Oral, Daily PRN, Opyd, Timothy S, MD .  sodium chloride flush (NS) 0.9 % injection 3 mL, 3 mL, Intravenous, Q12H, Opyd, Ilene Qua, MD, 3 mL at 03/06/20 1100 Labs CBC    Component Value Date/Time   WBC 7.4 03/06/2020 0441   RBC 2.90 (L) 03/06/2020 0441   HGB 9.0 (L) 03/06/2020 0441   HGB 9.4 (L) 11/11/2019 1652   HCT 28.8 (L) 03/06/2020 0441   HCT 28.5 (L) 11/11/2019 1652   PLT 320 03/06/2020 0441   PLT 471 (H) 11/11/2019 1652   MCV 99.3 03/06/2020  0441   MCV 94 11/11/2019 1652   MCH 31.0 03/06/2020 0441   MCHC 31.3 03/06/2020 0441   RDW 14.1 03/06/2020 0441   RDW 14.7 11/11/2019 1652   LYMPHSABS 1.2 12/22/2019 1806   MONOABS 0.3 12/22/2019 1806   EOSABS 0.2 12/22/2019 1806   BASOSABS 0.2 (H) 12/22/2019 1806    CMP     Component Value Date/Time   NA 131 (L) 03/06/2020 0441   NA 136 11/11/2019 1655   K 3.3 (L) 03/06/2020 0441   CL 94 (L) 03/06/2020 0441   CO2 26 03/06/2020 0441   GLUCOSE 125 (H) 03/06/2020 0441   BUN 14 03/06/2020 0441   BUN 23 11/11/2019 1655   CREATININE 1.52 (H) 03/06/2020 0441   CALCIUM 9.5 03/06/2020 0441    PROT 6.1 (L) 03/06/2020 0441   PROT 6.1 10/16/2019 1020   ALBUMIN 3.2 (L) 03/06/2020 0441   ALBUMIN 4.2 10/16/2019 1020   AST 60 (H) 03/06/2020 0441   ALT 94 (H) 03/06/2020 0441   ALKPHOS 108 03/06/2020 0441   BILITOT 0.4 03/06/2020 0441   BILITOT <0.2 10/16/2019 1020   GFRNONAA 37 (L) 03/06/2020 0441   GFRAA 43 (L) 03/06/2020 0441   TSH: 73.817, today 61.083.  Free T4 less than 0.25  B12 706 Folate 11.3   Imaging I have reviewed images in epic and the results pertinent to this consultation are: CT head-no acute changes MRI brain no acute changes MRI C-spine-motion degraded.  Central canal widely patent.  Mild C4-5 bilateral foraminal narrowing. MRI T-spine-mild degenerative changes.  No acute changes. MRI L-spine-mild lumbar spondylosis.  No acute changes.  EEG was normal and negative for seizures.  Assessment:  59 year old woman with past medical history of atrial fibrillation on anticoagulation, AKI, systolic heart failure, CKD, depression, diabetes, hyperlipidemia, hypertension, diabetes, presents for evaluation of multiple neurological complaints that include bilateral foot paresthesias for 3 days worse on the right, right hand paresthesias for 3 to 4 days, right hand clumsiness and weakness of about a day or 2 and right lower extremity weakness of 1 days duration along with intermittent jerking of individual limbs for the past 2 years. Has a history of seizure 2 years ago. Based on examination today, her sensory symptoms are likely attributable, given the negative imaging findings, to her severe hypothyroidism. Neuropathy is commonly caused by hypothyroidism and is usually reversible to variable extent depending on the severity of the hypothyroidism and the correction. Her myoclonic jerky looking movements are likely secondary to her deranged renal function and in addition gabapentin which in renally impaired patients can cause myoclonic  jerking.   Impression: Paresthesias-secondary to severe hypothyroidism Myoclonic jerks-secondary to metabolic derangements and gabapentin toxicity  Recommendations: -Correction of hypothyroidism per primary team as you are. -I would recommend discontinuing gabapentin at this time. -I would recommend follow-up with outpatient neurology after correcting toxic metabolic derangements including the hypothyroidism and discontinuing gabapentin for a while to give some time for resolution of the symptoms. -Please recall inpatient neurology as needed with questions.  Will relay plan to Dr Thereasa Solo  -- Amie Portland, MD [facility] Pager: [phone] If 7pm to 7am, please call on call as listed on AMION.

## 2020-03-06 NOTE — NC FL2 (Signed)
[place] LEVEL OF CARE SCREENING TOOL     IDENTIFICATION  Patient Name: Brianna Porter Birthdate: 04-02-1961 Sex: female Admission Date (Current Location): 03/04/2020  [facility] and [place] Number:  Herbalist and Address:  [facility], [place] [address], [place], [place] [address]      Provider Number: [id number]  Attending Physician Name and Address:  Cherene Altes, MD  Relative Name and Phone Number:  Albina Billet    Current Level of Care: Hospital Recommended Level of Care: [place] Prior Approval Number:    Date Approved/Denied:   PASRR Number: [id number]  Discharge Plan: SNF    Current Diagnoses: Patient Active Problem List   Diagnosis Date Noted  . Convulsions ([place]) 03/06/2020  . Encephalopathy 03/05/2020  . Fall 12/23/2019  . Gastrointestinal hemorrhage   . Pressure injury of skin 07/09/2019  . Acute gastric ulcer with hemorrhage   . Symptomatic anemia 06/28/2019  . Elevated liver function tests   . NICM (nonischemic cardiomyopathy) ([place])   . Mild CAD   . Confusion   . Diabetes ([place])   . Iron deficiency anemia   . Hyperthyroidism   . Altered mental status 05/07/2019  . Acute systolic heart failure ([place]) 05/07/2019  . AKI (acute kidney injury) ([place]) 05/07/2019  . Hyponatremia   . CAD (coronary artery disease)   . Essential hypertension   . Hypokalemia   . Prolonged Q-T interval on ECG 04/30/2019  . Atrial fibrillation with rapid ventricular response ([place]) 04/29/2019  . Persistent atrial fibrillation ([place])   . Hyperlipidemia 04/23/2019  . Anemia 04/23/2019  . CKD (chronic kidney disease) stage 3, GFR 30-59 ml/min 04/23/2019  . Depression 03/02/2019  . Osteoarthritis 03/02/2019  . Type 2 diabetes mellitus with complication, without long-term current use of insulin ([place]) 03/02/2019  . Hypertensive heart disease 03/02/2019    Orientation RESPIRATION BLADDER Height & Weight     Self, Place,  Time, Situation  Normal Continent Weight: 145 lb 4.5 oz (65.9 kg) Height:  5\' 2"  (157.5 cm)  BEHAVIORAL SYMPTOMS/MOOD NEUROLOGICAL BOWEL NUTRITION STATUS      Continent Diet (See discharge summary)  AMBULATORY STATUS COMMUNICATION OF NEEDS Skin   Extensive Assist Verbally Normal                       Personal Care Assistance Level of Assistance  Bathing, Dressing, Feeding Bathing Assistance: Limited assistance Feeding assistance: Limited assistance Dressing Assistance: Limited assistance     Functional Limitations Info  Sight, Hearing, Speech Sight Info: Adequate Hearing Info: Adequate Speech Info: Adequate    SPECIAL CARE FACTORS FREQUENCY  PT (By licensed PT), OT (By licensed OT)     PT Frequency: 5x a week OT Frequency: 5x a week            Contractures Contractures Info: Not present    Additional Factors Info  Code Status, Allergies Code Status Info: FULL Allergies Info: Codeine; Ambien (zolpidem Tartrate); Adhesive (tape)           Current Medications (03/06/2020):  This is the current hospital active medication list Current Facility-Administered Medications  Medication Dose Route Frequency Provider Last Rate Last Admin  . acetaminophen (TYLENOL) tablet 650 mg  650 mg Oral Q6H PRN Cherene Altes, MD      . ALPRAZolam Duanne Moron) tablet 0.5 mg  0.5 mg Oral QID PRN Opyd, Ilene Qua, MD      . apixaban (ELIQUIS) tablet 5 mg  5 mg Oral BID Cherene Altes, MD   5 mg at 03/06/20 1138  . benzocaine (ORAJEL) 10 % mucosal gel   Mouth/Throat QID PRN Cherene Altes, MD   Given at 03/06/20 1141  . DULoxetine (CYMBALTA) DR capsule 60 mg  60 mg Oral BID Opyd, Ilene Qua, MD   60 mg at 03/06/20 1020  . furosemide (LASIX) tablet 40 mg  40 mg Oral Daily Opyd, Ilene Qua, MD   40 mg at 03/06/20 1020  . [START ON 03/07/2020] gabapentin (NEURONTIN) capsule 100 mg  100 mg Oral BID Joette Catching T, MD      . metoprolol succinate (TOPROL-XL) 24 hr tablet 100 mg  100 mg  Oral Daily Opyd, Ilene Qua, MD   100 mg at 03/06/20 1020  . oxyCODONE (Oxy IR/ROXICODONE) immediate release tablet 5 mg  5 mg Oral Q6H PRN Cherene Altes, MD   5 mg at 03/06/20 1020  . pantoprazole (PROTONIX) EC tablet 40 mg  40 mg Oral BID Opyd, Ilene Qua, MD   40 mg at 03/06/20 1020  . polyethylene glycol (MIRALAX / GLYCOLAX) packet 17 g  17 g Oral Daily PRN Opyd, Ilene Qua, MD      . sodium chloride flush (NS) 0.9 % injection 3 mL  3 mL Intravenous Q12H Opyd, Ilene Qua, MD   3 mL at 03/06/20 1100     Discharge Medications: Please see discharge summary for a list of discharge medications.  Relevant Imaging Results:  Relevant Lab Results:   Additional Information SSN [id number]  Monque Haggar J Miguel Barrera, [place]

## 2020-03-06 NOTE — Progress Notes (Signed)
Brianna Porter  KZS:[id number] DOB: [birth year] DOA: 03/04/2020 PCP: Cher Nakai, MD    Brief Narrative:  59 year old with a history of atrial fibrillation on Eliquis, nonischemic cardiomyopathy, depression with anxiety, chronic anemia, and HTN who presented to the ED 7/17 with bilateral lower extremity paresthesias, right hand weakness, intermittent limb jerking, and convulsions.  She reportedly generalized seizure-like episode roughly 2 years ago, and a second similar episode 3 days ago after which she refused transport to the hospital.  She stated that her paresthesias have been present for 3 to 4 days and involve both feet and her right hand.  She felt that her right hand had been weak and lacking coordination for 2 days causing her to drop things.  She reports progressive worsening of generalized weakness such that she transition from using a cane to walk to using a walker, and is now fully dependent upon a wheelchair.  In the ED CT head was unrevealing for acute abnormalities.  MRI was negative for acute abnormalities.  Vital signs were otherwise stable.  Significant Events: 7/17 admit via  7/17 EEG no evidence of seizure or epileptiform discharges  Antimicrobials:  None  Subjective: Patient reports no significant change in her symptoms since admission.  She is alert and conversant.  She does report she has had intervening episodes of confusion.  She further tells me she has not taken Neurontin for many months and just recently resumed it approximately 3 to 4 days ago.  Assessment & Plan:  Sign/Sx complex: -- Bilateral foot paresthesias of about 3 days' duration, worse on the right -- Right hand paresthesias of about 3 days' duration -- Right hand clumsiness and weakness of about 1 day's duration -- RLE weakness of about 1 day's duration -- Intermittent jerking of individual limbs for the past 2 years - generalized convulsions x2  MRI brain with no acute abnormality noted -EEG  unrevealing -MRI of cervical thoracic and lumbar spine revealed only mild lumbar spondylosis but no acute findings otherwise -folic acid level normal -B12 level normal -ammonia normal -UDS unrevealing - stopping gabapentin   Severe hypothyroidism - possible early myxedema TSH markedly elevated at 74 - stop amiodarone dosing - free T3 and free T4 ordered along with a recheck of TSH - no hypothermia, hypoglycemia, hypoventilation, or hypotension, but bradycardia and hyponatremia are present and tremors and parathesias are felt to be a neurologic manifestation - will tx as a developing myxedema in absence of coma - hold on T3 given cardiac hx/difficult Afib - check adrenal axis  Atrial fibrillation -chronic CHA2DS2-VASc 3 - resume Eliquis - stopping amiodarone - consult her Cardiologist tomorrow to consider alternative antiarrhythmics   Hypokalemia Supplement and follow - check magnesium  CKD stage IIIb Baseline creatinine 1.36 - monitor w/ gently hydration   Nonischemic systolic congestive heart failure EF 20-25% September 2020  Depression/anxiety  Elevated transaminases Improving - follow trend   HTN Monitor BP trend    DVT prophylaxis: Eliquis Code Status: FULL CODE Family Communication:  Status is: Inpatient  Remains inpatient appropriate because:Unsafe d/c plan  Dispo:  Patient From: Home  Planned Disposition: [place]  Expected discharge date: 03/07/20  Medically stable for discharge: No  Consultants:  Neurology   Objective: Blood pressure (!) 148/98, pulse 62, temperature 98.2 F (36.8 C), temperature source Oral, resp. rate 18, height 5\' 2"  (1.575 m), weight 65.9 kg, SpO2 95 %. No intake or output data in the 24 hours ending 03/06/20 [place]  03/04/20 2110 03/05/20 2021 03/06/20 0334  Weight: 65.8 kg 65.9 kg 65.9 kg    Examination: General: No acute respiratory distress - tremulous - frequent myoclonic jerks  Lungs: Clear to  auscultation bilaterally without wheezes or crackles Cardiovascular: Regular rate and rhythm without murmur  Abdomen: Nontender, nondistended, soft, bowel sounds positive, no rebound, no ascites, no appreciable mass Extremities: No significant cyanosis, clubbing, or edema bilateral lower extremities  CBC: Recent Labs  Lab 03/04/20 2247 03/06/20 0441  WBC 8.4 7.4  HGB 9.6* 9.0*  HCT 31.0* 28.8*  MCV 99.0 99.3  PLT 315 119   Basic Metabolic Panel: Recent Labs  Lab 03/04/20 2247 03/06/20 0441  NA 135 131*  K 4.0 3.3*  CL 95* 94*  CO2 28 26  GLUCOSE 119* 125*  BUN 12 14  CREATININE 1.36* 1.52*  CALCIUM 9.0 9.5   GFR: Estimated Creatinine Clearance: 35.9 mL/min (A) (by C-G formula based on SCr of 1.52 mg/dL (H)).  Liver Function Tests: Recent Labs  Lab 03/04/20 2247 03/06/20 0441  AST 76* 60*  ALT 120* 94*  ALKPHOS 126 108  BILITOT 0.7 0.4  PROT 6.6 6.1*  ALBUMIN 3.7 3.2*    Recent Labs  Lab 03/04/20 2247  AMMONIA 19   HbA1C: Hgb A1c MFr Bld  Date/Time Value Ref Range Status  04/30/2019 11:52 AM 5.9 (H) 4.8 - 5.6 % Final    Comment:    (NOTE)         Prediabetes: 5.7 - 6.4         Diabetes: >6.4         Glycemic control for adults with diabetes: <7.0     CBG: No results for input(s): GLUCAP in the last 168 hours.  Recent Results (from the past 240 hour(s))  Urine culture     Status: Abnormal   Collection Time: 03/04/20 10:56 PM   Specimen: Urine, Clean Catch  Result Value Ref Range Status   Specimen Description URINE, CLEAN CATCH  Final   Special Requests   Final    NONE Performed at [facility], [address] [place], [place] [address]    Culture MULTIPLE SPECIES PRESENT, SUGGEST RECOLLECTION (A)  Final   Report Status 03/06/2020 FINAL  Final  SARS Coronavirus 2 by RT PCR (hospital order, performed in [facility] hospital lab) Nasopharyngeal Nasopharyngeal Swab     Status: None   Collection Time: 03/05/20  6:26 AM   Specimen:  Nasopharyngeal Swab  Result Value Ref Range Status   SARS Coronavirus 2 NEGATIVE NEGATIVE Final    Comment: (NOTE) SARS-CoV-2 target nucleic acids are NOT DETECTED.  The SARS-CoV-2 RNA is generally detectable in upper and lower respiratory specimens during the acute phase of infection. The lowest concentration of SARS-CoV-2 viral copies this assay can detect is 250 copies / mL. A negative result does not preclude SARS-CoV-2 infection and should not be used as the sole basis for treatment or other patient management decisions.  A negative result may occur with improper specimen collection / handling, submission of specimen other than nasopharyngeal swab, presence of viral mutation(s) within the areas targeted by this assay, and inadequate number of viral copies (<250 copies / mL). A negative result must be combined with clinical observations, patient history, and epidemiological information.  Fact Sheet for Patients:   StrictlyIdeas.no  Fact Sheet for Healthcare Providers: BankingDealers.co.za  This test is not yet approved or  cleared by the [place] FDA and has been authorized for detection  and/or diagnosis of SARS-CoV-2 by FDA under an Emergency Use Authorization (EUA).  This EUA will remain in effect (meaning this test can be used) for the duration of the COVID-19 declaration under Section 564(b)(1) of the Act, 21 U.S.C. section 360bbb-3(b)(1), unless the authorization is terminated or revoked sooner.  Performed at [facility], [place] [address] [place], [place] [address]      Scheduled Meds: . amiodarone  200 mg Oral BID  . DULoxetine  60 mg Oral BID  . furosemide  40 mg Oral Daily  . gabapentin  400 mg Oral BID  . metoprolol succinate  100 mg Oral Daily  . pantoprazole  40 mg Oral BID  . sodium chloride flush  3 mL Intravenous Q12H  . sodium chloride flush  3 mL Intravenous Q12H   Continuous Infusions: .  sodium chloride       LOS: 0 days   Cherene Altes, MD [facility] Office  [phone] Pager - Text Page per Shea Evans  If 7PM-7AM, please contact night-coverage per Amion 03/06/2020, 10:34 AM

## 2020-03-07 ENCOUNTER — Encounter (HOSPITAL_COMMUNITY): Payer: Self-pay | Admitting: Internal Medicine

## 2020-03-07 DIAGNOSIS — I480 Paroxysmal atrial fibrillation: Secondary | ICD-10-CM

## 2020-03-07 DIAGNOSIS — R7989 Other specified abnormal findings of blood chemistry: Secondary | ICD-10-CM

## 2020-03-07 DIAGNOSIS — N1832 Chronic kidney disease, stage 3b: Secondary | ICD-10-CM

## 2020-03-07 DIAGNOSIS — G9340 Encephalopathy, unspecified: Secondary | ICD-10-CM

## 2020-03-07 DIAGNOSIS — I428 Other cardiomyopathies: Secondary | ICD-10-CM

## 2020-03-07 DIAGNOSIS — D509 Iron deficiency anemia, unspecified: Secondary | ICD-10-CM | POA: Diagnosis not present

## 2020-03-07 DIAGNOSIS — E038 Other specified hypothyroidism: Secondary | ICD-10-CM

## 2020-03-07 DIAGNOSIS — I10 Essential (primary) hypertension: Secondary | ICD-10-CM

## 2020-03-07 LAB — COMPREHENSIVE METABOLIC PANEL
ALT: 80 U/L — ABNORMAL HIGH (ref 0–44)
AST: 52 U/L — ABNORMAL HIGH (ref 15–41)
Albumin: 3.1 g/dL — ABNORMAL LOW (ref 3.5–5.0)
Alkaline Phosphatase: 105 U/L (ref 38–126)
Anion gap: 12 (ref 5–15)
BUN: 12 mg/dL (ref 6–20)
CO2: 27 mmol/L (ref 22–32)
Calcium: 9 mg/dL (ref 8.9–10.3)
Chloride: 95 mmol/L — ABNORMAL LOW (ref 98–111)
Creatinine, Ser: 1.36 mg/dL — ABNORMAL HIGH (ref 0.44–1.00)
GFR calc Af Amer: 50 mL/min — ABNORMAL LOW (ref >60)
GFR calc non Af Amer: 43 mL/min — ABNORMAL LOW (ref >60)
Glucose, Bld: 118 mg/dL — ABNORMAL HIGH (ref 70–99)
Potassium: 3.1 mmol/L — ABNORMAL LOW (ref 3.5–5.1)
Sodium: 134 mmol/L — ABNORMAL LOW (ref 135–145)
Total Bilirubin: 0.8 mg/dL (ref 0.3–1.2)
Total Protein: 6.1 g/dL — ABNORMAL LOW (ref 6.5–8.1)

## 2020-03-07 LAB — CBC
HCT: 28.6 % — ABNORMAL LOW (ref 36.0–46.0)
Hemoglobin: 8.9 g/dL — ABNORMAL LOW (ref 12.0–15.0)
MCH: 31 pg (ref 26.0–34.0)
MCHC: 31.1 g/dL (ref 30.0–36.0)
MCV: 99.7 fL (ref 80.0–100.0)
Platelets: 324 10*3/uL (ref 150–400)
RBC: 2.87 MIL/uL — ABNORMAL LOW (ref 3.87–5.11)
RDW: 14.1 % (ref 11.5–15.5)
WBC: 5.9 10*3/uL (ref 4.0–10.5)
nRBC: 0.3 % — ABNORMAL HIGH (ref 0.0–0.2)

## 2020-03-07 LAB — PORPHOBILINOGEN, RANDOM URINE

## 2020-03-07 LAB — T3, FREE: T3, Free: <0.3 pg/mL — ABNORMAL LOW (ref 2.0–4.4)

## 2020-03-07 LAB — CORTISOL: Cortisol, Plasma: 1.5 ug/dL

## 2020-03-07 MED ORDER — POTASSIUM CHLORIDE CRYS ER 20 MEQ PO TBCR
40.0000 meq | EXTENDED_RELEASE_TABLET | Freq: Once | ORAL | Status: AC
  Administered 2020-03-07: 40 meq via ORAL
  Filled 2020-03-07: qty 2

## 2020-03-07 MED ORDER — MAGNESIUM OXIDE 400 (241.3 MG) MG PO TABS
400.0000 mg | ORAL_TABLET | Freq: Once | ORAL | Status: AC
  Administered 2020-03-07: 400 mg via ORAL
  Filled 2020-03-07: qty 1

## 2020-03-07 MED ORDER — COSYNTROPIN 0.25 MG IJ SOLR
0.2500 mg | Freq: Once | INTRAMUSCULAR | Status: AC
  Administered 2020-03-08: 0.25 mg via INTRAVENOUS
  Filled 2020-03-07 (×2): qty 0.25

## 2020-03-07 NOTE — Consult Note (Addendum)
Cardiology Consultation:   Patient ID: Brianna Porter MRN: [id number]; DOB: 1960/10/23  Admit date: 03/04/2020 Date of Consult: 03/07/2020  Primary Care Provider: Cher Nakai, MD [facility] Cardiologist: Shirlee More, MD  [facility] Electrophysiologist:  Will Meredith Leeds, MD    Patient Profile:   Brianna Porter is a 59 y.o. female with a hx of paroxysmal atrial fibrillation and flutter, chronic systolic CHF/NICM, mild nonobstructive CAD by coronary CTA 04/2019, hypertensive heart disease, CKD stage III, iron deficiency anemia, DM, abnormal LFTs, HTN, HLD, hyperthyroidism s/p prior methimazole rx, prolonged QT interval, falls who is being seen today for the evaluation of antiarrhythmic management at the request of Dr. Thereasa Solo.  History of Present Illness:   Per chart review, she initially had atrial flutter then atrial fibrillation and was seen by cardiology in September 2020. At that time labs showed normal thyroid, mild anemia, renal insufficiency, mildly abnormal liver function, and thrombocytosis. She underwent TEE/DCCV with EF 20% with global hypokinesis. Coronary CTA 04/2019 showed calcium score 90th percentile, minimal nonobstructive CAD in the prox LAD and Cx. She had recurrent AF RVR that same month so was started on amiodarone. She saw Dr. Curt Bears with EP as outpatient 09/2019 and it was planned to pursue ablation but this was deferred due to persistent anemia and worsened renal function. Outpatient TSH was 70 at that time so methimazole was stopped. She was admitted in 12/2019 with falls and orbital + tibial fractures. During that admission, her TSH was noted to still be elevated at 55. Per notes, she had reported to [facility] tech that she was still taking so it was discontinued off med list again.   This admission, she presented to the hospital with bilateral lower extremity parasthesias, right hand weakness, intermittent limb jerking and convulsions. She had had intermittent  myoclonus in the past per her daughter but it got worse when she started gabapentin just a few days ago. She had noticed her right hand had been weak and lacking coordination, causing her to drop things. MRI brain was negative for acute finding, + moderate changes of chronic small vessel disease. Workup ultimately revealed continued hypothyroidism, felt possibly early myxedema with elevated TSH of 73 and low free T4. Neurology also recommended to discontinue the gabapentin due to her myoclonic jerks. She has been started on levothyroxine. EEG normal. Labs also reveal hyponatremia nadir 131, hypokalemia of 3.1, creatinine of 1.3-1.5, abnormal AST/ALT, Hgb 8.9. Skin assessment by wound team also shows numerous full and partial skin lesions with unknown etiology, autoimmune vs infectious vs trauma. The patient is very anxious, with daughter at bedside, distraught over need for medication changes. Amiodarone has been discontinued. She mentions that Eliquis is also being stopped but I see this was continued. She denies any recent palpitations, CP, SOB, orthopnea, edema. She appears to have distal lower extremity muscle wasting with numerous skin excoriations. Per PT, felt to require SNF. Daughter reports that prior to September 2020, was fully ambulatory but progressively got worse requiring increased assistance the last few months. She was supposed to be on amiodarone 200mg  once daily but has been taking BID  For unclear reasons.   Past Medical History:  Diagnosis Date  . Acute gastric ulcer with hemorrhage   . Altered mental status 05/07/2019  . Anxiety and depression   . Atrial flutter ([place])   . CAD (coronary artery disease)   . Chronic systolic CHF (congestive heart failure) ([place])    a. dx in setting of atrial fib/flutter - possibly tachy  mediated. Coronary CTA with only mild CAD in 04/2019.  Marland Kitchen CKD (chronic kidney disease) stage 3, GFR 30-59 ml/min 04/23/2019  . Confusion    a. persistent confusion during  04/2019 admission of unclear cause. Home meds adjusted. CT/MRI brain nonacute.  . Depression 03/02/2019  . Diabetes ([place])   . Edema   . Elevated liver function tests   . Essential hypertension   . Falls   . Gastrointestinal hemorrhage   . Hypercholesteremia   . Hyperkalemia   . Hyperlipidemia 04/23/2019  . Hypertensive heart and chronic kidney disease with systolic congestive heart failure ([place])   . Hypertensive heart disease 03/02/2019  . Hyperthyroidism   . Hypokalemia   . Hypokalemia   . Hypomagnesemia   . Hyponatremia   . Iron deficiency anemia   . Mild CAD    a. Coronary CT 04/2019 - Minimal, Non-obstructive CAD.  Marland Kitchen NICM (nonischemic cardiomyopathy) ([place])   . Osteoarthritis 03/02/2019  . PAF (paroxysmal atrial fibrillation) ([place])   . Pressure injury of skin 07/09/2019  . Prolonged QT interval   . Symptomatic anemia 06/28/2019    Past Surgical History:  Procedure Laterality Date  . BIOPSY  06/29/2019   Procedure: BIOPSY;  Surgeon: Irene Shipper, MD;  Location: [place];  Service: Endoscopy;;  . CARDIOVERSION N/A 04/29/2019   Procedure: CARDIOVERSION;  Surgeon: Sanda Klein, MD;  Location: [place];  Service: Cardiovascular;  Laterality: N/A;  . CARDIOVERSION N/A 05/04/2019   Procedure: CARDIOVERSION;  Surgeon: Josue Hector, MD;  Location: [place];  Service: Cardiovascular;  Laterality: N/A;  . COLONOSCOPY WITH PROPOFOL N/A 07/12/2019   Procedure: COLONOSCOPY WITH PROPOFOL;  Surgeon: Doran Stabler, MD;  Location: [place];  Service: Gastroenterology;  Laterality: N/A;  . ENTEROSCOPY N/A 07/10/2019   Procedure: ENTEROSCOPY;  Surgeon: Doran Stabler, MD;  Location: [place];  Service: Gastroenterology;  Laterality: N/A;  . ESOPHAGOGASTRODUODENOSCOPY  06/29/2019  . ESOPHAGOGASTRODUODENOSCOPY (EGD) WITH PROPOFOL N/A 06/29/2019   Procedure: ESOPHAGOGASTRODUODENOSCOPY (EGD) WITH PROPOFOL;  Surgeon: Irene Shipper, MD;  Location: [facility] ENDOSCOPY;  Service:  Endoscopy;  Laterality: N/A;  . GIVENS CAPSULE STUDY N/A 07/12/2019   Procedure: GIVENS CAPSULE STUDY;  Surgeon: Doran Stabler, MD;  Location: [place];  Service: Gastroenterology;  Laterality: N/A;  . HEMOSTASIS CLIP PLACEMENT  07/12/2019   Procedure: HEMOSTASIS CLIP PLACEMENT;  Surgeon: Doran Stabler, MD;  Location: [place];  Service: Gastroenterology;;  . POLYPECTOMY  07/12/2019   Procedure: POLYPECTOMY;  Surgeon: Doran Stabler, MD;  Location: [place];  Service: Gastroenterology;;  . SMALL BOWEL ENTEROSCOPY  07/10/2019  . SUBMUCOSAL TATTOO INJECTION  07/10/2019   Procedure: SUBMUCOSAL TATTOO INJECTION;  Surgeon: Doran Stabler, MD;  Location[facility] ENDOSCOPY;  Service: Gastroenterology;;  . TEE WITHOUT CARDIOVERSION  04/29/2019  . TEE WITHOUT CARDIOVERSION N/A 04/29/2019   Procedure: TRANSESOPHAGEAL ECHOCARDIOGRAM (TEE);  Surgeon: Sanda Klein, MD;  Location: [facility] ENDOSCOPY;  Service: Cardiovascular;  Laterality: N/A;  . TUBAL LIGATION       Home Medications:  Prior to Admission medications   Medication Sig Start Date End Date Taking? Authorizing Provider  acetaminophen (TYLENOL) 325 MG tablet Take 650 mg by mouth every 6 (six) hours as needed for mild pain.   Yes [provider]  albuterol (VENTOLIN HFA) 108 (90 Base) MCG/ACT inhaler Inhale 2 puffs into the lungs every 6 (six) hours as needed for wheezing or shortness of breath.   Yes [provider]  ALPRAZolam (  XANAX) 0.5 MG tablet Take 0.5 mg by mouth 4 (four) times daily as needed for anxiety or sleep.  02/12/20  Yes [provider]  amiodarone (PACERONE) 200 MG tablet Take 1 tablet (200 mg total) by mouth daily. Patient taking differently: Take 200 mg by mouth 2 (two) times daily.  10/12/19  Yes Camnitz, Ocie Doyne, MD  apixaban (ELIQUIS) 5 MG TABS tablet Take 1 tablet (5 mg total) by mouth 2 (two) times daily. Patient taking differently: Take 5 mg by mouth daily.  10/12/19  Yes  Camnitz, Ocie Doyne, MD  Ascorbic Acid (VITAMIN C) 1000 MG tablet Take 1,000 mg by mouth daily.   Yes [provider]  buPROPion (WELLBUTRIN XL) 150 MG 24 hr tablet Take 150 mg by mouth at bedtime.    Yes [provider]  cetirizine (ZYRTEC) 10 MG tablet Take 10 mg by mouth daily as needed for allergies.   Yes [provider]  cholecalciferol (VITAMIN D3) 25 MCG (1000 UNIT) tablet Take 1,000 Units by mouth daily.   Yes [provider]  DULoxetine (CYMBALTA) 60 MG capsule Take 120 mg by mouth in the morning.  02/26/19  Yes [provider]  fenofibrate (TRICOR) 48 MG tablet Take 48 mg by mouth at bedtime.   Yes [provider]  ferrous sulfate 325 (65 FE) MG tablet Take 325 mg by mouth every other day.    Yes [provider]  furosemide (LASIX) 40 MG tablet Take 1 tablet by mouth twice daily Patient taking differently: Take 40 mg by mouth daily.  01/08/20  Yes Richardo Priest, MD  gabapentin (NEURONTIN) 400 MG capsule Take 400 mg by mouth 2 (two) times daily.  03/02/20  Yes [provider]  meclizine (ANTIVERT) 12.5 MG tablet Take 1 tablet (12.5 mg total) by mouth 3 (three) times daily as needed for dizziness. 12/29/19  Yes Antonieta Pert, MD  Menthol, Topical Analgesic, (ICY HOT ADVANCED RELIEF EX) Apply 1 application topically 3 (three) times daily as needed (pain).    Yes [provider]  metoprolol succinate (TOPROL-XL) 100 MG 24 hr tablet Take 1 tablet (100 mg total) by mouth daily. Take with or immediately following a meal. 10/12/19  Yes Camnitz, Ocie Doyne, MD  OVER THE COUNTER MEDICATION Apply 1 application topically daily as needed (pain).   Yes [provider]  pantoprazole (PROTONIX) 40 MG tablet Take 40 mg by mouth 2 (two) times daily.  10/15/19  Yes [provider]  polyethylene glycol (MIRALAX / GLYCOLAX) 17 g packet Take 17 g by mouth at bedtime.    Yes [provider]  potassium chloride  SA (KLOR-CON) 20 MEQ tablet Take 1 tablet (20 mEq total) by mouth 2 (two) times daily. Patient taking differently: Take 20 mEq by mouth daily.  02/23/20  Yes Dorie Rank, MD  promethazine (PHENERGAN) 25 MG tablet Take 25 mg by mouth every 6 (six) hours as needed for nausea or vomiting.  02/12/20  Yes [provider]  tetrahydrozoline-zinc (VISINE-AC) 0.05-0.25 % ophthalmic solution Place 2 drops into both eyes 3 (three) times daily as needed (dry eyes).   Yes [provider]  traMADol (ULTRAM) 50 MG tablet Take 50 mg by mouth 4 (four) times daily as needed for moderate pain or severe pain.  02/12/20  Yes [provider]    Inpatient Medications: Scheduled Meds: . apixaban  5 mg Oral BID  . DULoxetine  60 mg Oral BID  . gabapentin  100 mg  Oral BID  . levothyroxine  100 mcg Oral Q0600  . metoprolol succinate  100 mg Oral Daily  . nicotine  21 mg Transdermal Daily  . pantoprazole  40 mg Oral BID  . sodium chloride flush  3 mL Intravenous Q12H   Continuous Infusions:  PRN Meds: acetaminophen, ALPRAZolam, benzocaine, oxyCODONE, polyethylene glycol  Allergies:    Allergies  Allergen Reactions  . Ambien [Zolpidem Tartrate]     Pt and family state this med makes the patient sleepwalk.  . Adhesive [Tape]     Tears skin, please use paper tape  . Codeine Nausea And Vomiting    Social History:   Social History   Socioeconomic History  . Marital status: Divorced    Spouse name: Not on file  . Number of children: Not on file  . Years of education: Not on file  . Highest education level: Not on file  Occupational History  . Not on file  Tobacco Use  . Smoking status: Former Smoker    Packs/day: 2.00    Years: 30.00    Pack years: 60.00    Types: Cigarettes    Quit date: 2001    Years since quitting: 20.5  . Smokeless tobacco: Never Used  Vaping Use  . Vaping Use: Never used  Substance and Sexual Activity  . Alcohol use: Never  . Drug use: Never  .  Sexual activity: Not Currently  Other Topics Concern  . Not on file  Social History Narrative  . Not on file   Social Determinants of Health   Financial Resource Strain:   . Difficulty of Paying Living Expenses:   Food Insecurity:   . Worried About Charity fundraiser in the Last Year:   . Arboriculturist in the Last Year:   Transportation Needs:   . Film/video editor (Medical):   Marland Kitchen Lack of Transportation (Non-Medical):   Physical Activity:   . Days of Exercise per Week:   . Minutes of Exercise per Session:   Stress:   . Feeling of Stress :   Social Connections:   . Frequency of Communication with Friends and Family:   . Frequency of Social Gatherings with Friends and Family:   . Attends Religious Services:   . Active Member of Clubs or Organizations:   . Attends Archivist Meetings:   Marland Kitchen Marital Status:   Intimate Partner Violence:   . Fear of Current or Ex-Partner:   . Emotionally Abused:   Marland Kitchen Physically Abused:   . Sexually Abused:     Family History:   Family History  Problem Relation Age of Onset  . Cancer Mother   . Heart disease Mother   . Hypercholesterolemia Mother   . Osteoarthritis Mother   . Stroke Mother   . Heart disease Father   . Hypercholesterolemia Brother      ROS:  Please see the history of present illness.  All other ROS reviewed and negative.     Physical Exam/Data:   Vitals:   03/07/20 0414 03/07/20 0500 03/07/20 0834 03/07/20 1215  BP: (!) 161/65  (!) 142/70 124/87  Pulse: 62  (!) 56 (!) 55  Resp:   18 18  Temp: 98.2 F (36.8 C)  97.8 F (36.6 C) 98.2 F (36.8 C)  TempSrc: Oral  Oral Oral  SpO2: 97%  98% 100%  Weight:  70.4 kg    Height:       No intake or output data in  the 24 hours ending 03/07/20 1443 Last 3 Weights 03/07/2020 03/06/2020 03/05/2020  Weight (lbs) 155 lb 3.3 oz 145 lb 4.5 oz 145 lb 4.5 oz  Weight (kg) 70.4 kg 65.9 kg 65.9 kg     Body mass index is 28.39 kg/m.  General: Chronically ill  appearing WF in no acute distress. Central obesity noted but with significant lower extremity muscle wasting Head: Normocephalic, atraumatic, sclera non-icteric, no xanthomas, nares are without discharge. Sparse thinning of hair noted. Neck: Negative for carotid bruits. JVP not elevated. Lungs: Clear bilaterally to auscultation without wheezes, rales, or rhonchi. Breathing is unlabored. Heart: RRR S1 S2 without murmurs, rubs, or gallops.  Abdomen: Soft, non-tender, non-distended with normoactive bowel sounds. No rebound/guarding. Extremities: No clubbing or cyanosis. No edema. Distal pedal pulses are 2+ and equal bilaterally. Skin with diffuse excoriated lesions of varying size, shape and escar  Neuro: Alert and oriented X 3. Moves all extremities spontaneously. Psych:  Responds to questions appropriately with anxious affect.   EKG:  The EKG was personally reviewed and demonstrates:  SB 53bpm with artifact, NSIVCD, QTC appears prolonged but difficult to assess based on quality of tracing  Telemetry:  Telemetry was personally reviewed and demonstrates: NSR, artifact  Relevant CV Studies: TEE 04/2019  1. The left ventricle has severely reduced systolic function, with an  ejection fraction of 20-25%. The cavity size was normal. Left ventrical  global hypokinesis without regional wall motion abnormalities.  2. The right ventricle has severely reduced systolic function. The cavity  was normal. There is no increase in right ventricular wall thickness.  Right ventricular systolic pressure is normal.  3. No evidence of a thrombus present in the left atrial appendage.  4. Mild mitral insufficiency. The jet is centrally-directed.  5. The aortic root, ascending aorta, aortic arch and descending aorta are  normal in size and structure.  6. No intracardiac thrombi or masses were visualized.    Laboratory Data:  High Sensitivity Troponin:  No results for input(s): TROPONINIHS in the last 720  hours.   Chemistry Recent Labs  Lab 03/04/20 2247 03/06/20 0441 03/07/20 1223  NA 135 131* 134*  K 4.0 3.3* 3.1*  CL 95* 94* 95*  CO2 28 26 27   GLUCOSE 119* 125* 118*  BUN 12 14 12   CREATININE 1.36* 1.52* 1.36*  CALCIUM 9.0 9.5 9.0  GFRNONAA 43* 37* 43*  GFRAA 50* 43* 50*  ANIONGAP 12 11 12     Recent Labs  Lab 03/04/20 2247 03/06/20 0441 03/07/20 1223  PROT 6.6 6.1* 6.1*  ALBUMIN 3.7 3.2* 3.1*  AST 76* 60* 52*  ALT 120* 94* 80*  ALKPHOS 126 108 105  BILITOT 0.7 0.4 0.8   Hematology Recent Labs  Lab 03/04/20 2247 03/06/20 0441 03/07/20 1223  WBC 8.4 7.4 5.9  RBC 3.13* 2.90* 2.87*  HGB 9.6* 9.0* 8.9*  HCT 31.0* 28.8* 28.6*  MCV 99.0 99.3 99.7  MCH 30.7 31.0 31.0  MCHC 31.0 31.3 31.1  RDW 14.0 14.1 14.1  PLT 315 320 324   BNPNo results for input(s): BNP, PROBNP in the last 168 hours.  DDimer No results for input(s): DDIMER in the last 168 hours.   Radiology/Studies:  EEG  Result Date: 03/05/2020 Lora Havens, MD     03/05/2020  9:09 AM Patient Name: Brianna Porter MRN: [id number] Epilepsy Attending: Lora Havens Referring Physician/Provider: Dr Mitzi Hansen Date: 03/05/2020 Duration: 27.29 mins Patient history: 59yo F presenting wiyh paresthesias and intermittent limb jerking. EEG to evaluate  for seizure Level of alertness: Awake, asleep AEDs during EEG study: Gabapentin Technical aspects: This EEG study was done with scalp electrodes positioned according to the 10-20 International system of electrode placement. Electrical activity was acquired at a sampling rate of 500Hz  and reviewed with a high frequency filter of 70Hz  and a low frequency filter of 1Hz . EEG data were recorded continuously and digitally stored. Description: The posterior dominant rhythm consists of 10 Hz activity of moderate voltage (25-35 uV) seen predominantly in posterior head regions, symmetric and reactive to eye opening and eye closing. Sleep was characterized by vertex waves, sleep  spindles (12 to 14 Hz), maximal frontocentral region. Hyperventilation did not show any EEG change. Photic stimulation was not performed.   IMPRESSION: This study is within normal limits. No seizures or epileptiform discharges were seen throughout the recording. Lora Havens   CT Head Wo Contrast  Result Date: 03/04/2020 CLINICAL DATA:  Right leg weakness, numbness, and tingling. Speech difficulty. EXAM: CT HEAD WITHOUT CONTRAST TECHNIQUE: Contiguous axial images were obtained from the base of the skull through the vertex without intravenous contrast. COMPARISON:  02/23/2020 FINDINGS: Brain: Scattered hypodensities within the periventricular white matter compatible with stable chronic small vessel ischemic changes. No signs of acute infarct or hemorrhage. Lateral ventricles and midline structures are unremarkable. No acute extra-axial fluid collections. No mass effect. Vascular: No hyperdense vessel or unexpected calcification. Skull: Normal. Negative for fracture or focal lesion. Sinuses/Orbits: No acute finding. Other: None. IMPRESSION: 1. Stable head CT, no acute process. Electronically Signed   By: Randa Ngo M.D.   On: 03/04/2020 23:35   MR BRAIN WO CONTRAST  Result Date: 03/05/2020 CLINICAL DATA:  Initial evaluation for acute neuro deficit, stroke suspected. EXAM: MRI HEAD WITHOUT CONTRAST TECHNIQUE: Multiplanar, multiecho pulse sequences of the brain and surrounding structures were obtained without intravenous contrast. COMPARISON:  Prior head CT from 03/04/2020. FINDINGS: Brain: Examination moderately degraded by motion artifact. Cerebral volume within normal limits for age. Patchy T2/FLAIR hyperintensity within the periventricular deep white matter both cerebral hemispheres most consistent with chronic small vessel ischemic disease, mild to moderate in nature. No abnormal foci of restricted diffusion to suggest acute or subacute ischemia. Gray-white matter differentiation maintained. No  encephalomalacia to suggest chronic cortical infarction. No acute or chronic intracranial hemorrhage. No mass lesion, midline shift or mass effect. No hydrocephalus or extra-axial fluid collection. Pituitary gland suprasellar region within normal limits. Midline structures intact. Vascular: Major intracranial vascular flow voids are maintained. Skull and upper cervical spine: Craniocervical junction within normal limits. Upper cervical spine normal. Bone marrow signal intensity within normal limits. No scalp soft tissue abnormality. Sinuses/Orbits: Globes and orbital soft tissues within normal limits. Paranasal sinuses are clear. Small bilateral mastoid effusions noted, of doubtful significance. Other: None. IMPRESSION: 1. No acute intracranial abnormality. 2. Mild to moderate chronic microvascular ischemic disease. Electronically Signed   By: Jeannine Boga M.D.   On: 03/05/2020 03:41   MR CERVICAL SPINE WO CONTRAST  Result Date: 03/05/2020 CLINICAL DATA:  Acute onset right leg weakness, numbness and tingling on approximately 03/03/2020. Right worse than left leg weakness. History of multiple falls. Initial encounter. EXAM: MRI CERVICAL SPINE WITHOUT CONTRAST TECHNIQUE: Multiplanar, multisequence MR imaging of the cervical spine was performed. No intravenous contrast was administered. COMPARISON:  Brain MRI this same day. Cervical spine MRI 08/01/2011. FINDINGS: Alignment: The exam is degraded by patient motion. There is mild reversal of the normal cervical lordosis. Vertebrae: No fracture, evidence of discitis, or bone lesion.  Cord: Appears normal. Posterior Fossa, vertebral arteries, paraspinal tissues: Negative. Disc levels: C2-3: The right facets appear ankylosed. No disc bulge or protrusion. No stenosis. C3-4: Minimal disc bulge and mild facet arthropathy without stenosis. C4-5: Shallow disc bulge to the right. Shallow right paracentral protrusion seen on the prior examination has regressed. The  central canal is open. Very mild bilateral foraminal narrowing. C5-6: Negative. C6-7: Negative. C7-T1: Negative. IMPRESSION: Motion degraded examination demonstrates no finding to explain the patient's symptoms. No new abnormality since the prior MRI. The central canal is widely patent at all levels. Very mild bilateral foraminal narrowing C4-5 is seen. Electronically Signed   By: Inge Rise M.D.   On: 03/05/2020 12:33   MR THORACIC SPINE WO CONTRAST  Result Date: 03/05/2020 CLINICAL DATA:  Acute onset right leg weakness, numbness and tingling on approximately 03/03/2020. Right worse than left leg weakness. History of multiple falls. Initial encounter. EXAM: MRI THORACIC SPINE WITHOUT CONTRAST TECHNIQUE: Multiplanar, multisequence MR imaging of the thoracic spine was performed. No intravenous contrast was administered. COMPARISON:  None. FINDINGS: Alignment:  Trace anterolisthesis T2 on T3.  Otherwise maintained. Vertebrae: No fracture, evidence of discitis, or bone lesion. Cord: Motion degrades evaluation but no signal abnormality in the cord is seen. Paraspinal and other soft tissues: Negative. Disc levels: The central canal and neural foramina are widely patent at all levels with scattered mild degenerative disc disease most notable at T9-10 where there is a shallow left paracentral protrusion. IMPRESSION: No finding to explain the patient's symptoms. The thoracic spinal cord is normal in appearance. Mild thoracic degenerative change without stenosis. Electronically Signed   By: Inge Rise M.D.   On: 03/05/2020 12:42   MR LUMBAR SPINE WO CONTRAST  Result Date: 03/05/2020 CLINICAL DATA:  Acute onset right leg weakness, numbness and tingling on approximately 03/03/2020. Right worse than left leg weakness. History of multiple falls. Initial encounter. EXAM: MRI LUMBAR SPINE WITHOUT CONTRAST TECHNIQUE: Multiplanar, multisequence MR imaging of the lumbar spine was performed. No intravenous contrast  was administered. COMPARISON:  None. FINDINGS: Segmentation:  Standard. Alignment:  Maintained. Vertebrae:  No fracture, evidence of discitis, or bone lesion. Conus medullaris and cauda equina: Conus extends to the L1 level. Conus and cauda equina appear normal. Paraspinal and other soft tissues: Negative. Disc levels: L1-2: Loss of disc space height with a shallow bulge.  No stenosis. L2-3: Loss of disc space height with a bulge and endplate spur more prominent to the left. Mild left foraminal narrowing. The central canal and right foramen are open. L3-4: Disc bulge to the right causes mild narrowing in the right lateral recess. Foramina open. L4-5: Shallow disc bulge without stenosis. L5-S1: Negative. IMPRESSION: No finding to explain the patient's symptoms. Mild lumbar spondylosis as described above. Electronically Signed   By: Inge Rise M.D.   On: 03/05/2020 12:48   DG Chest Portable 1 View  Result Date: 03/04/2020 CLINICAL DATA:  Speech difficulty EXAM: PORTABLE CHEST 1 VIEW COMPARISON:  12/22/2019 FINDINGS: The heart size and mediastinal contours are within normal limits. Both lungs are clear. The visualized skeletal structures are unremarkable. IMPRESSION: No active disease. Electronically Signed   By: Randa Ngo M.D.   On: 03/04/2020 23:23     Assessment and Plan:   1. Parasthesias and myoclonic jerks - parasthesias felt possibly secondary to severe hypothyroidism, myoclonic jerks felt due to metabolic derangements and gabapentin toxicity.  - per IM, neurology - now on levothyroxine  2. H/o paroxysmal atrial fibrillation - s/p  DCCVx2 in 04/2019, on amiodarone since then. Ablation entertained as OP, but postponed earlier this year in favor of medical workup of worsening renal function and anemia. She is maintaining NSR this admission, found to have hypothyroidism.  - discontinue amiodarone - would not be a candidate for flecainide due to structural heart disease - can f/u with EP as  outpatient to revisit topic of ablation as amiodarone washes out, which will take months - continue Eliquis as tolerated - felt to require SNF for physical assistance, need to continue to re-evaluate candidacy at each OV  3. Prolonged QT interval - difficult to assess on admit EKG, but 558ms on telemetry. - amiodarone discontinued - avoid additional QT prolonging agents - keep K 4.0 or greater, Mg 2.0 or greater  4. Nonischemic cardiomyopathy - home Lasix on hold by primary team with gentle hydration given - appears euvolemic - continue metoprolol - has not been on ACEI/ARB/spiro it looks like due to renal insufficiency,  can re-evaluate once more clinically stable as outpatient - recheck echocardiogram as inpatient as she has not had one since initial dx of afib in 04/2019  5. Medical issues which include CKD stage III, hyponatremia, hypokalemia, abnormal LFTs, chronic anemia, skin lesions, hypoalbuminemia, muscular wasting of the lower extremities. - ? Unifying diagnosis. Question whether there was some other medical process that was present even before acute issues arose. Baseline labs back in 04/2019 demonstrated anemia, thrombocytosis, renal insufficiency and hypokalemia at that time. TSH normal then, but now deranged since February 2021. - has had random cortisol testing but based on habitus could consider more formal testing to exclude Cushing's - per medical team - will need OP primary care follow-up as well  6. Hypokalemia - K 3.1, do not yet see repletion ordered today. Lasix on hold as of today. - Give 55meq KCl x 1 now - Mg 1.9 yesterday, will give Mag Ox 400mg  x 1 today (CrCl 62ml/min) - f/u lytes in AM - further management per primary team  For questions or updates, please contact [place] Please consult [email or web address] for contact info under   Signed, Charlie Pitter, PA-C  03/07/2020 2:43 PM  The patient was seen, examined and discussed with Melina Copa, PA-C and I agree  with the above.    Brianna Porter is a 59 y.o. female with a hx of paroxysmal atrial fibrillation and flutter, chronic systolic CHF/NICM, mild nonobstructive CAD by coronary CTA 04/2019, hypertensive heart disease, CKD stage III, iron deficiency anemia, DM, abnormal LFTs, HTN, HLD, hyperthyroidism s/p prior methimazole rx, prolonged QT interval, falls who is being seen today for the evaluation of antiarrhythmic management at the request of Dr. Thereasa Solo.  The patient has a very complicated history, to summarize she has been through tremendous emotional stress last year and it was followed by her admission to the hospital with atrial fibrillation with RVR requiring TEE with cardioversion LVEF 20% at that time with global hypokinesis, coronary CTA showed only mild nonobstructive CAD, she was also found to have elevated liver enzyme in acute renal failure.  She was started on amiodarone following that admission, per daughter her condition deteriorated with her inability to walk, weakness, peripheral wasting with increased abdominal girth, and facial swelling in a pattern of cushingoid syndrome.  Patient was readmitted she was found to have significantly elevated TSH, she has borderline QT prolongation that is unchanged from prior EKGs, she has significant bruising on her body with anemia and hemoglobin in a 8-9 range.  Given  LFT elevation I would not give her any statins also she only had mild nonobstructive CAD, also she was taking high-dose of amiodarone 200 mg p.o. twice daily for 9 months instead of 200 mg daily.  At this point will discontinue amiodarone, I would continue Eliquis for stroke prevention, unfortunately amiodarone take several months to be washed out of her system.  We will obtain another echocardiogram and to evaluate for her systolic diastolic function.  Meanwhile further work-up should be performed to find reason for her multiorgan failure, she possibly has a cushingoid disease after  significant stress, further testing should be performed. Thank you for the consultation, we will follow on her results.  Ena Dawley, MD 03/07/2020

## 2020-03-07 NOTE — Progress Notes (Signed)
Brianna Porter  VCB:[id number] DOB: October 02, 1960 DOA: 03/04/2020 PCP: Cher Nakai, MD    Brief Narrative:  59 year old with a history of atrial fibrillation on Eliquis, nonischemic cardiomyopathy, depression with anxiety, chronic anemia, and HTN who presented to the ED 7/17 with bilateral lower extremity paresthesias, right hand weakness, intermittent limb jerking, and convulsions.  She reportedly generalized seizure-like episode roughly 2 years ago, and a second similar episode 3 days ago after which she refused transport to the hospital.  She stated that her paresthesias have been present for 3 to 4 days and involve both feet and her right hand.  She felt that her right hand had been weak and lacking coordination for 2 days causing her to drop things.  She reports progressive worsening of generalized weakness such that she transition from using a cane to walk to using a walker, and is now fully dependent upon a wheelchair.  In the ED CT head was unrevealing for acute abnormalities.  MRI was negative for acute abnormalities.  Vital signs were otherwise stable.  Significant Events: 7/17 admit via [place] 7/17 EEG no evidence of seizure or epileptiform discharges  Antimicrobials:  None  Subjective: Reports that she feels "terrible" today, with ongoing complaints as prescribed at admit without change. Also reports ongoing pain in her L shoulder which has been present for weeks, and for which she was to be seen in an Orthopedic office tomorrow.   Assessment & Plan:  Sign/Sx complex: -- Bilateral foot paresthesias of about 3 days' duration, worse on the right -- Right hand paresthesias of about 3 days' duration -- Right hand clumsiness and weakness of about 1 day's duration -- RLE weakness of about 1 day's duration -- Intermittent jerking of individual limbs for the past 2 years - generalized convulsions x2  MRI brain with no acute abnormality noted - EEG unrevealing - MRI of cervical thoracic  and lumbar spine revealed only mild lumbar spondylosis but no acute findings otherwise -folic acid level normal -B12 level normal -ammonia normal -UDS unrevealing - stopping gabapentin   Severe hypothyroidism - possible early myxedema TSH markedly elevated at 74 - stopped amiodarone - free T4 very low and repeat TSH remains markedly elevated - no hypothermia, hypoglycemia, hypoventilation, or hypotension, but bradycardia and hyponatremia are present and tremors and parathesias are felt to be a neurologic manifestation - will tx as a developing myxedema in absence of coma - hold on T3 given cardiac hx/difficult Afib   Possible adrenal insufficiency  Random serum cortisol quite low at 1.5 - will obtain ACTH stim test and hold hydrocortisone dosing until results available   Atrial fibrillation -chronic CHA2DS2-VASc 3 - resumed Eliquis - stopped amiodarone - Cardiology has evaluated her and made recommendations for tx    Hypokalemia Supplement and follow - Mg is normal   CKD stage IIIb Baseline creatinine 1.36 - back to baseline w/ gently hydration   Nonischemic systolic congestive heart failure EF 20-25% September 2020 - euvolemic at present   Depression/anxiety  Elevated transaminases Improving - follow trend   HTN Monitor BP trend    DVT prophylaxis: Eliquis Code Status: FULL CODE Family Communication:  Status is: Inpatient  Remains inpatient appropriate because:Unsafe d/c plan  Dispo:  Patient From: Home  Planned Disposition: [place]  Expected discharge date: 03/11/20  Medically stable for discharge: No  Consultants:  Neurology   Objective: Blood pressure (!) 142/70, pulse (!) 56, temperature 97.8 F (36.6 C), temperature source Oral, resp. rate 18, height  5\' 2"  (1.575 m), weight 70.4 kg, SpO2 98 %. No intake or output data in the 24 hours ending 03/07/20 0930 Filed Weights   03/05/20 2021 03/06/20 0334 03/07/20 0500  Weight: 65.9 kg 65.9 kg 70.4 kg     Examination: General: No acute respiratory distress - tremulous - frequent myoclonic jerks continue   Lungs: Clear to auscultation bilaterally  Cardiovascular: Regular rate and rhythm - no rub or M  Abdomen: NT/ND, soft, bs+, no mass  Extremities: No signif edema bilateral lower extremities  CBC: Recent Labs  Lab 03/04/20 2247 03/06/20 0441  WBC 8.4 7.4  HGB 9.6* 9.0*  HCT 31.0* 28.8*  MCV 99.0 99.3  PLT 315 841   Basic Metabolic Panel: Recent Labs  Lab 03/04/20 2247 03/06/20 0441 03/06/20 1109  NA 135 131*  --   K 4.0 3.3*  --   CL 95* 94*  --   CO2 28 26  --   GLUCOSE 119* 125*  --   BUN 12 14  --   CREATININE 1.36* 1.52*  --   CALCIUM 9.0 9.5  --   MG  --   --  1.9   GFR: Estimated Creatinine Clearance: 37.1 mL/min (A) (by C-G formula based on SCr of 1.52 mg/dL (H)).  Liver Function Tests: Recent Labs  Lab 03/04/20 2247 03/06/20 0441  AST 76* 60*  ALT 120* 94*  ALKPHOS 126 108  BILITOT 0.7 0.4  PROT 6.6 6.1*  ALBUMIN 3.7 3.2*    Recent Labs  Lab 03/04/20 2247  AMMONIA 19   HbA1C: Hgb A1c MFr Bld  Date/Time Value Ref Range Status  04/30/2019 11:52 AM 5.9 (H) 4.8 - 5.6 % Final    Comment:    (NOTE)         Prediabetes: 5.7 - 6.4         Diabetes: >6.4         Glycemic control for adults with diabetes: <7.0     CBG: No results for input(s): GLUCAP in the last 168 hours.  Recent Results (from the past 240 hour(s))  Urine culture     Status: Abnormal   Collection Time: 03/04/20 10:56 PM   Specimen: Urine, Clean Catch  Result Value Ref Range Status   Specimen Description URINE, CLEAN CATCH  Final   Special Requests   Final    NONE Performed at [facility], [address] [place], [place] [address]    Culture MULTIPLE SPECIES PRESENT, SUGGEST RECOLLECTION (A)  Final   Report Status 03/06/2020 FINAL  Final  SARS Coronavirus 2 by RT PCR (hospital order, performed in [facility] hospital lab) Nasopharyngeal Nasopharyngeal Swab      Status: None   Collection Time: 03/05/20  6:26 AM   Specimen: Nasopharyngeal Swab  Result Value Ref Range Status   SARS Coronavirus 2 NEGATIVE NEGATIVE Final    Comment: (NOTE) SARS-CoV-2 target nucleic acids are NOT DETECTED.  The SARS-CoV-2 RNA is generally detectable in upper and lower respiratory specimens during the acute phase of infection. The lowest concentration of SARS-CoV-2 viral copies this assay can detect is 250 copies / mL. A negative result does not preclude SARS-CoV-2 infection and should not be used as the sole basis for treatment or other patient management decisions.  A negative result may occur with improper specimen collection / handling, submission of specimen other than nasopharyngeal swab, presence of viral mutation(s) within the areas targeted by this assay, and inadequate number of viral copies (<250  copies / mL). A negative result must be combined with clinical observations, patient history, and epidemiological information.  Fact Sheet for Patients:   StrictlyIdeas.no  Fact Sheet for Healthcare Providers: BankingDealers.co.za  This test is not yet approved or  cleared by the [place] FDA and has been authorized for detection and/or diagnosis of SARS-CoV-2 by FDA under an Emergency Use Authorization (EUA).  This EUA will remain in effect (meaning this test can be used) for the duration of the COVID-19 declaration under Section 564(b)(1) of the Act, 21 U.S.C. section 360bbb-3(b)(1), unless the authorization is terminated or revoked sooner.  Performed at [facility], [place] [address], [place],  [address]      Scheduled Meds: . apixaban  5 mg Oral BID  . DULoxetine  60 mg Oral BID  . gabapentin  100 mg Oral BID  . levothyroxine  100 mcg Oral Q0600  . metoprolol succinate  100 mg Oral Daily  . nicotine  21 mg Transdermal Daily  . pantoprazole  40 mg Oral BID  . sodium chloride flush   3 mL Intravenous Q12H     LOS: 1 day   Cherene Altes, MD [facility] Office  [phone] Pager - Text Page per Amion  If 7PM-7AM, please contact night-coverage per Amion 03/07/2020, 9:30 AM

## 2020-03-08 ENCOUNTER — Inpatient Hospital Stay (HOSPITAL_COMMUNITY): Payer: Medicare HMO

## 2020-03-08 DIAGNOSIS — D509 Iron deficiency anemia, unspecified: Secondary | ICD-10-CM | POA: Diagnosis not present

## 2020-03-08 DIAGNOSIS — I4819 Other persistent atrial fibrillation: Secondary | ICD-10-CM

## 2020-03-08 LAB — ECHOCARDIOGRAM COMPLETE
Area-P 1/2: 3.37 cm2
Height: 62 in
S' Lateral: 2.1 cm
Weight: 2550.28 oz

## 2020-03-08 LAB — BASIC METABOLIC PANEL
Anion gap: 9 (ref 5–15)
BUN: 12 mg/dL (ref 6–20)
CO2: 25 mmol/L (ref 22–32)
Calcium: 9 mg/dL (ref 8.9–10.3)
Chloride: 99 mmol/L (ref 98–111)
Creatinine, Ser: 1.4 mg/dL — ABNORMAL HIGH (ref 0.44–1.00)
GFR calc Af Amer: 48 mL/min — ABNORMAL LOW (ref >60)
GFR calc non Af Amer: 41 mL/min — ABNORMAL LOW (ref >60)
Glucose, Bld: 143 mg/dL — ABNORMAL HIGH (ref 70–99)
Potassium: 4.3 mmol/L (ref 3.5–5.1)
Sodium: 133 mmol/L — ABNORMAL LOW (ref 135–145)

## 2020-03-08 LAB — CBC
HCT: 27.5 % — ABNORMAL LOW (ref 36.0–46.0)
Hemoglobin: 8.6 g/dL — ABNORMAL LOW (ref 12.0–15.0)
MCH: 31.2 pg (ref 26.0–34.0)
MCHC: 31.3 g/dL (ref 30.0–36.0)
MCV: 99.6 fL (ref 80.0–100.0)
Platelets: 283 10*3/uL (ref 150–400)
RBC: 2.76 MIL/uL — ABNORMAL LOW (ref 3.87–5.11)
RDW: 14.3 % (ref 11.5–15.5)
WBC: 6.6 10*3/uL (ref 4.0–10.5)
nRBC: 0.3 % — ABNORMAL HIGH (ref 0.0–0.2)

## 2020-03-08 LAB — ACTH STIMULATION, 3 TIME POINTS
Cortisol, 30 Min: 12.5 ug/dL
Cortisol, 60 Min: 15 ug/dL
Cortisol, Base: 1.4 ug/dL

## 2020-03-08 MED ORDER — HYDROCORTISONE NA SUCCINATE PF 100 MG IJ SOLR
50.0000 mg | Freq: Four times a day (QID) | INTRAMUSCULAR | Status: AC
  Administered 2020-03-08 – 2020-03-09 (×3): 50 mg via INTRAVENOUS
  Filled 2020-03-08 (×3): qty 2

## 2020-03-08 MED ORDER — HYDROCORTISONE NA SUCCINATE PF 100 MG IJ SOLR
100.0000 mg | Freq: Once | INTRAMUSCULAR | Status: AC
  Administered 2020-03-08: 100 mg via INTRAVENOUS
  Filled 2020-03-08: qty 2

## 2020-03-08 MED ORDER — HYDROCORTISONE 5 MG PO TABS
5.0000 mg | ORAL_TABLET | Freq: Two times a day (BID) | ORAL | Status: DC
  Administered 2020-03-09 – 2020-03-18 (×19): 5 mg via ORAL
  Filled 2020-03-08 (×20): qty 1

## 2020-03-08 NOTE — Progress Notes (Signed)
  Echocardiogram 2D Echocardiogram has been performed.  Brianna Porter 03/08/2020, 11:52 AM

## 2020-03-08 NOTE — Progress Notes (Signed)
Physical Therapy Treatment Patient Details Name: Brianna Porter MRN: [id number] DOB: 1960/11/08 Today's Date: 03/08/2020    History of Present Illness Pt is a 59 y/o female admitted secondary to convulsions, weakness and numbness. MRI of the brain, cervical spine, thoracic spine and lumbar spine were all negative for any acute findings. An EEG was done and results were WNL. Further neuro workup pending. PMH including but not limited to atrial fibrillation on Eliquis, nonischemic cardiomyopathy, depression with anxiety, chronic anemia, hypertension.     PT Comments    Pt progressing towards physical therapy goals. She continues to be limited by L shoulder pain and R knee pain which pt attributes to frequent falls. She was not able to tolerate weight bearing on the RLE enough to attempt ambulation or even pivotal steps from bed to chair. She was able to utilize the [place] with +2 mod assist to transition to the recliner. Noted pt with fragile skin and several skin tears, and a new skin tear on the L forearm from bumping it on the [place] during the transfer. Also noted a stage I pressure injury on the patient's bottom. RN notified and did not visualize prior to therapist placing foam dressings over both areas. Pt at a high risk for skin breakdown and would recommend bilateral prevalon boots and an air mattress. Will continue to follow.    Follow Up Recommendations  SNF     Equipment Recommendations  Other (comment) Optician, dispensing)    Recommendations for Other Services       Precautions / Restrictions Precautions Precautions: Fall Precaution Comments: very fragile skin Restrictions Weight Bearing Restrictions: No    Mobility  Bed Mobility Overal bed mobility: Needs Assistance Bed Mobility: Sit to Supine       Sit to supine: Mod assist;+2 for physical assistance;HOB elevated   General bed mobility comments: pt was able to transition to a sidelying position however required +2 assist to  elevate trunk to full sitting position. Once sitting, pt able to scoot herself out so feet touched the floor, however therapist assisted slightly with the bed pad.   Transfers Overall transfer level: Needs assistance Equipment used: 2 person hand held assist;Rolling walker (2 wheeled) Transfers: Sit to/from Stand Sit to Stand: Mod assist;+2 physical assistance         General transfer comment: Initially attempted with RW and pt was unable to maintain standing due to RLE pain with weightbearing and difficulty bearing weight through LUE on the walker. With Stedy, pt appeared to have an easier time pulling to stand, however +2 mod assist required to gain full hip extension in order to put the Stedy flaps down and up.   Ambulation/Gait             General Gait Details: unable   Stairs             Wheelchair Mobility    Modified Rankin (Stroke Patients Only)       Balance Overall balance assessment: Needs assistance Sitting-balance support: Feet unsupported;Feet supported Sitting balance-Leahy Scale: Poor     Standing balance support: Bilateral upper extremity supported Standing balance-Leahy Scale: Poor Standing balance comment: bilateral UE support required                            Cognition Arousal/Alertness: Awake/alert Behavior During Therapy: Anxious Overall Cognitive Status: Impaired/Different from baseline Area of Impairment: Attention;Memory;Safety/judgement  Current Attention Level: Selective Memory: Decreased short-term memory Following Commands: Follows one step commands with increased time;Follows one step commands inconsistently Safety/Judgement: Decreased awareness of deficits;Decreased awareness of safety Awareness: Emergent Problem Solving: Slow processing;Requires verbal cues;Difficulty sequencing;Requires tactile cues        Exercises      General Comments        Pertinent Vitals/Pain Pain  Assessment: Faces Faces Pain Scale: Hurts little more Pain Location: L shoulder, R knee, generalized moaning with movement Pain Descriptors / Indicators: Guarding;Grimacing;Moaning Pain Intervention(s): Monitored during session;Limited activity within patient's tolerance;Repositioned    Home Living                      Prior Function            PT Goals (current goals can now be found in the care plan section) Acute Rehab PT Goals Patient Stated Goal: to get stronger PT Goal Formulation: With patient Time For Goal Achievement: 03/19/20 Potential to Achieve Goals: Fair Progress towards PT goals: Progressing toward goals    Frequency    Min 2X/week      PT Plan Current plan remains appropriate    Co-evaluation PT/OT/SLP Co-Evaluation/Treatment: Yes Reason for Co-Treatment: Complexity of the patient's impairments (multi-system involvement);For patient/therapist safety;To address functional/ADL transfers (RN reports unstable shoulder with earlier transfer) PT goals addressed during session: Mobility/safety with mobility;Balance;Proper use of DME;Strengthening/ROM        AM-PAC PT "6 Clicks" Mobility   Outcome Measure  Help needed turning from your back to your side while in a flat bed without using bedrails?: A Lot Help needed moving from lying on your back to sitting on the side of a flat bed without using bedrails?: A Lot Help needed moving to and from a bed to a chair (including a wheelchair)?: Total Help needed standing up from a chair using your arms (e.g., wheelchair or bedside chair)?: Total Help needed to walk in hospital room?: Total Help needed climbing 3-5 steps with a railing? : Total 6 Click Score: 8    End of Session Equipment Utilized During Treatment: Gait belt Activity Tolerance: Patient limited by fatigue Patient left: in chair;with call bell/phone within reach;with chair alarm set Nurse Communication: Mobility status;Need for lift equipment  Charlaine Dalton) PT Visit Diagnosis: Other abnormalities of gait and mobility (R26.89);Muscle weakness (generalized) (M62.81)     Time: 4174-0814 PT Time Calculation (min) (ACUTE ONLY): 39 min  Charges:  $Gait Training: 8-22 mins $Therapeutic Activity: 8-22 mins                     Rolinda Roan, PT, DPT Acute Rehabilitation Services Pager: [phone] Office: [phone]    Thelma Comp 03/08/2020, 1:46 PM

## 2020-03-08 NOTE — Progress Notes (Signed)
Asked secretary to order prevalon boots and air mattress per PT recommendation

## 2020-03-08 NOTE — Progress Notes (Signed)
Brianna Porter  XAJ:[id number] DOB: [birth year] DOA: 03/04/2020 PCP: Cher Nakai, MD    Brief Narrative:  59 year old with a history of atrial fibrillation on Eliquis, nonischemic cardiomyopathy, depression with anxiety, chronic anemia, and HTN who presented to the ED 7/17 with bilateral lower extremity paresthesias, right hand weakness, intermittent limb jerking, and convulsions.  She reportedly generalized seizure-like episode roughly 2 years ago, and a second similar episode 3 days ago after which she refused transport to the hospital.  She stated that her paresthesias have been present for 3 to 4 days and involve both feet and her right hand.  She felt that her right hand had been weak and lacking coordination for 2 days causing her to drop things.  She reports progressive worsening of generalized weakness such that she transition from using a cane to walk to using a walker, and is now fully dependent upon a wheelchair.  In the ED CT head was unrevealing for acute abnormalities.  MRI was negative for acute abnormalities.  Vital signs were otherwise stable.  Significant Events: 7/17 admit via [place] 7/17 EEG no evidence of seizure or epileptiform discharges  Antimicrobials:  None  Subjective: The patient tells me she feels a little bit better today.  She is certainly more alert and conversant and cheerful.  She reports ongoing pain in her left shoulder.  She denies headache nausea or vomiting.  No chest pain or shortness of breath.  Assessment & Plan:  Severe hypothyroidism - possible early myxedema TSH markedly elevated at 74 - stopped amiodarone - free T4 very low and repeat TSH remains markedly elevated - no hypothermia, hypoglycemia, hypoventilation, or hypotension, but bradycardia and hyponatremia are present and tremors and parathesias are felt to be a neurologic manifestation - will tx as a developing myxedema in absence of coma - hold on T3 dosing given cardiac hx/difficult Afib    Adrenal insufficiency  Random serum cortisol quite low at 1.5 - ACTH stim test indicates brisk adrenal response to cosyntropin dosing - begin hydrocortisone replacement with IV stress dosing x24hrs followed by oral dosing -patient vehemently denies use of steroids chronically or even acutely recently -studies appear suggestive of a tertiary or secondary adrenal insufficiency but there is no clear reason this will be the case -follow with establishment of hydrocortisone dosing -would benefit from outpatient endocrinology evaluation once stabilized  Sign/Sx complex: -- Bilateral foot paresthesias of about 3 days' duration, worse on the right -- Right hand paresthesias of about 3 days' duration -- Right hand clumsiness and weakness of about 1 day's duration -- RLE weakness of about 1 day's duration -- Intermittent jerking of individual limbs for the past 2 years - generalized convulsions x2  MRI brain with no acute abnormality noted - EEG unrevealing - MRI of cervical thoracic and lumbar spine revealed only mild lumbar spondylosis but no acute findings otherwise -folic acid level normal -B12 level normal -ammonia normal -UDS unrevealing - stopped neurontin as Neurology felt this was contributing - sx seem to be improving slowly - perhaps these sx are also a manifestation of her mutliple endocrine derangements   Atrial fibrillation - chronic CHA2DS2-VASc 3 - resumed Eliquis - stopped amiodarone - Cardiology has evaluated her and made recommendations for tx    Hypokalemia Corrected with supplementation  CKD stage IIIb Baseline creatinine 1.36 - back to baseline w/ gentle hydration   Nonischemic systolic congestive heart failure EF 20-25% September 2020 - euvolemic at present   Depression/anxiety Mood is stable at the  present time  Elevated transaminases Improving -recheck in a.m.  HTN Blood pressure in reasonable range at present -follow trend  Left shoulder pain -subacute Patient  tells me she was due to see an orthopedist for this pain in the outpatient setting -plain film XR 7/6 did not reveal an acute finding -if symptoms do not improve with use of hydrocortisone (indicated for other reasons noted above) will consider MRI to rule out ligamentous injury   DVT prophylaxis: Eliquis Code Status: FULL CODE Family Communication:  Status is: Inpatient  Remains inpatient appropriate because:Unsafe d/c plan  Dispo:  Patient From: Home  Planned Disposition: [place]  Expected discharge date: 03/11/20  Medically stable for discharge: No  Consultants:  Neurology   Objective: Blood pressure (!) 158/79, pulse (!) 59, temperature 98.2 F (36.8 C), temperature source Oral, resp. rate 15, height 5\' 2"  (1.575 m), weight 72.3 kg, SpO2 99 %. No intake or output data in the 24 hours ending 03/08/20 1036 Filed Weights   03/06/20 0334 03/07/20 0500 03/08/20 0500  Weight: 65.9 kg 70.4 kg 72.3 kg    Examination: General: No acute respiratory distress -less tremor noted today Lungs: Clear to auscultation bilaterally without wheezing Cardiovascular: Regular rate and rhythm - no rub or M  Abdomen: NT/ND, soft, bs+, no mass  Extremities: No edema bilateral lower extremities  CBC: Recent Labs  Lab 03/06/20 0441 03/07/20 1223 03/08/20 0412  WBC 7.4 5.9 6.6  HGB 9.0* 8.9* 8.6*  HCT 28.8* 28.6* 27.5*  MCV 99.3 99.7 99.6  PLT 320 324 347   Basic Metabolic Panel: Recent Labs  Lab 03/06/20 0441 03/06/20 1109 03/07/20 1223 03/08/20 0412  NA 131*  --  134* 133*  K 3.3*  --  3.1* 4.3  CL 94*  --  95* 99  CO2 26  --  27 25  GLUCOSE 125*  --  118* 143*  BUN 14  --  12 12  CREATININE 1.52*  --  1.36* 1.40*  CALCIUM 9.5  --  9.0 9.0  MG  --  1.9  --   --    GFR: Estimated Creatinine Clearance: 40.8 mL/min (A) (by C-G formula based on SCr of 1.4 mg/dL (H)).  Liver Function Tests: Recent Labs  Lab 03/04/20 2247 03/06/20 0441 03/07/20 1223  AST  76* 60* 52*  ALT 120* 94* 80*  ALKPHOS 126 108 105  BILITOT 0.7 0.4 0.8  PROT 6.6 6.1* 6.1*  ALBUMIN 3.7 3.2* 3.1*    Recent Labs  Lab 03/04/20 2247  AMMONIA 19   HbA1C: Hgb A1c MFr Bld  Date/Time Value Ref Range Status  04/30/2019 11:52 AM 5.9 (H) 4.8 - 5.6 % Final    Comment:    (NOTE)         Prediabetes: 5.7 - 6.4         Diabetes: >6.4         Glycemic control for adults with diabetes: <7.0     CBG: No results for input(s): GLUCAP in the last 168 hours.  Recent Results (from the past 240 hour(s))  Urine culture     Status: Abnormal   Collection Time: 03/04/20 10:56 PM   Specimen: Urine, Clean Catch  Result Value Ref Range Status   Specimen Description URINE, CLEAN CATCH  Final   Special Requests   Final    NONE Performed at [facility], [address] [place], [place] [address]    Culture MULTIPLE SPECIES PRESENT, SUGGEST RECOLLECTION (A)  Final   Report Status 03/06/2020 FINAL  Final  SARS Coronavirus 2 by RT PCR (hospital order, performed in [facility] hospital lab) Nasopharyngeal Nasopharyngeal Swab     Status: None   Collection Time: 03/05/20  6:26 AM   Specimen: Nasopharyngeal Swab  Result Value Ref Range Status   SARS Coronavirus 2 NEGATIVE NEGATIVE Final    Comment: (NOTE) SARS-CoV-2 target nucleic acids are NOT DETECTED.  The SARS-CoV-2 RNA is generally detectable in upper and lower respiratory specimens during the acute phase of infection. The lowest concentration of SARS-CoV-2 viral copies this assay can detect is 250 copies / mL. A negative result does not preclude SARS-CoV-2 infection and should not be used as the sole basis for treatment or other patient management decisions.  A negative result may occur with improper specimen collection / handling, submission of specimen other than nasopharyngeal swab, presence of viral mutation(s) within the areas targeted by this assay, and inadequate number of viral copies (<250 copies / mL). A  negative result must be combined with clinical observations, patient history, and epidemiological information.  Fact Sheet for Patients:   StrictlyIdeas.no  Fact Sheet for Healthcare Providers: BankingDealers.co.za  This test is not yet approved or  cleared by the [place] FDA and has been authorized for detection and/or diagnosis of SARS-CoV-2 by FDA under an Emergency Use Authorization (EUA).  This EUA will remain in effect (meaning this test can be used) for the duration of the COVID-19 declaration under Section 564(b)(1) of the Act, 21 U.S.C. section 360bbb-3(b)(1), unless the authorization is terminated or revoked sooner.  Performed at [facility], [place] [address], [place], [place] [address]      Scheduled Meds: . apixaban  5 mg Oral BID  . DULoxetine  60 mg Oral BID  . gabapentin  100 mg Oral BID  . levothyroxine  100 mcg Oral Q0600  . metoprolol succinate  100 mg Oral Daily  . nicotine  21 mg Transdermal Daily  . pantoprazole  40 mg Oral BID  . sodium chloride flush  3 mL Intravenous Q12H     LOS: 2 days   Cherene Altes, MD [facility] Office  [phone] Pager - Text Page per Shea Evans  If 7PM-7AM, please contact night-coverage per Amion 03/08/2020, 10:36 AM

## 2020-03-08 NOTE — Progress Notes (Signed)
Occupational Therapy Treatment Patient Details Name: Brianna Porter MRN: [id number] DOB: 07/29/1961 Today's Date: 03/08/2020    History of present illness Pt is a 59 y/o female admitted secondary to convulsions, weakness and numbness. MRI of the brain, cervical spine, thoracic spine and lumbar spine were all negative for any acute findings. An EEG was done and results were WNL. Further neuro workup pending. PMH including but not limited to atrial fibrillation on Eliquis, nonischemic cardiomyopathy, depression with anxiety, chronic anemia, hypertension.    OT comments  Pt seen in conjunction with PT to maximize participation and activity tolerance. Pt continues to present with decreased activity tolerance, impaired functional use of LUE and generalized weakness impacting pts ability to complete BADLs. Pt able to progress OOB to recliner with use of stedy and MODA +2 to power into standing and fully extend hips into standing. Pt continues to c/o of pain in L shoulder, noted increased crepitus however pt was able to use LUE to stand to stedy and pt able to complete ~ 90* active ROM shoulder flexion; alerted MD. Dc plan remains appropriate, will follow acutely per POC.   Follow Up Recommendations  SNF;Supervision/Assistance - 24 hour    Equipment Recommendations  Other (comment);3 in 1 bedside commode (hoyer)    Recommendations for Other Services      Precautions / Restrictions Precautions Precautions: Fall Precaution Comments: very fragile skin Restrictions Weight Bearing Restrictions: No       Mobility Bed Mobility Overal bed mobility: Needs Assistance Bed Mobility: Sit to Supine       Sit to supine: Mod assist;+2 for physical assistance;HOB elevated   General bed mobility comments: pt was able to transition to a sidelying position however required +2 assist to elevate trunk to full sitting position. Once sitting, pt able to scoot herself out so feet touched the floor, however  therapist assisted slightly with the bed pad.   Transfers Overall transfer level: Needs assistance Equipment used: Rolling walker (2 wheeled);Ambulation equipment used Transfers: Sit to/from Stand Sit to Stand: Mod assist;+2 physical assistance         General transfer comment: Initially attempted with RW and pt was unable to maintain standing due to RLE pain with weightbearing and difficulty bearing weight through LUE on the walker. With Stedy, pt appeared to have an easier time pulling to stand, however +2 mod assist required to gain full hip extension in order to put the Stedy flaps down and up.     Balance Overall balance assessment: Needs assistance Sitting-balance support: Feet unsupported;Feet supported Sitting balance-Leahy Scale: Poor     Standing balance support: Bilateral upper extremity supported Standing balance-Leahy Scale: Poor Standing balance comment: bilateral UE support required                           ADL either performed or assessed with clinical judgement   ADL Overall ADL's : Needs assistance/impaired                         Toilet Transfer: Moderate assistance;+2 for physical assistance Toilet Transfer Details (indicate cue type and reason): to stand to stedy only. pt reports pain in L shoulder but is able to reach with LUE to stand to stedy. Pt with impaired ability to full WB on RLE. unable to pivot BLEs at this time         Functional mobility during ADLs: Moderate assistance;+2 for safety/equipment (sit<>stand only) General  ADL Comments: pt with decreased activity tolerance, impaired use of LUE and generalized weakness     Vision       Perception     Praxis      Cognition Arousal/Alertness: Awake/alert Behavior During Therapy: Anxious Overall Cognitive Status: Impaired/Different from baseline Area of Impairment: Attention;Safety/judgement;Awareness;Problem solving;Memory                   Current Attention  Level: Selective Memory: Decreased short-term memory (unable to recall what MD stated even though MD had spoken to pt 5 mins prior to session) Following Commands: Follows one step commands with increased time;Follows one step commands inconsistently Safety/Judgement: Decreased awareness of deficits;Decreased awareness of safety Awareness: Emergent Problem Solving: Slow processing;Requires verbal cues;Difficulty sequencing;Requires tactile cues General Comments: pt overall WFL but often perseverating on current level of function needing cues to orient back to session.        Exercises Other Exercises Other Exercises: pt c/o pain in L shoulder however ROM WFL; alerted MD   Shoulder Instructions       General Comments pt with numerous skin tears, issued pt built up foam for self feeding as pt reports impaired functional grasp and tendency to drop utensils    Pertinent Vitals/ Pain       Pain Assessment: Faces Faces Pain Scale: Hurts little more Pain Location: L shoulder, R knee, generalized moaning with movement Pain Descriptors / Indicators: Guarding;Grimacing;Moaning Pain Intervention(s): Limited activity within patient's tolerance;Monitored during session;Repositioned  Home Living                                          Prior Functioning/Environment              Frequency  Min 2X/week        Progress Toward Goals  OT Goals(current goals can now be found in the care plan section)  Progress towards OT goals: Progressing toward goals  Acute Rehab OT Goals Patient Stated Goal: to get stronger OT Goal Formulation: With patient Time For Goal Achievement: 03/19/20 Potential to Achieve Goals: Good  Plan Discharge plan remains appropriate;Frequency remains appropriate    Co-evaluation      Reason for Co-Treatment: Complexity of the patient's impairments (multi-system involvement);For patient/therapist safety;To address functional/ADL transfers PT  goals addressed during session: Mobility/safety with mobility;Balance;Proper use of DME;Strengthening/ROM OT goals addressed during session: ADL's and self-care      AM-PAC OT "6 Clicks" Daily Activity     Outcome Measure   Help from another person eating meals?: A Little Help from another person taking care of personal grooming?: A Little Help from another person toileting, which includes using toliet, bedpan, or urinal?: A Lot Help from another person bathing (including washing, rinsing, drying)?: A Lot Help from another person to put on and taking off regular upper body clothing?: A Lot Help from another person to put on and taking off regular lower body clothing?: Total 6 Click Score: 13    End of Session Equipment Utilized During Treatment: Gait belt;Other (comment) (stedy)  OT Visit Diagnosis: Unsteadiness on feet (R26.81);Other abnormalities of gait and mobility (R26.89);Muscle weakness (generalized) (M62.81);Other symptoms and signs involving cognitive function;Other symptoms and signs involving the nervous system (R29.898)   Activity Tolerance Patient tolerated treatment well   Patient Left in chair;with call bell/phone within reach;with chair alarm set   Nurse Communication Mobility status;Other (comment) (stedy for  back to bed)        Time: 8127-5170 OT Time Calculation (min): 34 min  Charges: OT General Charges $OT Visit: 1 Visit OT Treatments $Therapeutic Activity: 8-22 mins  Lanier Clam., COTA/L Acute Rehabilitation Services [phone]    Ihor Gully 03/08/2020, 2:27 PM

## 2020-03-08 NOTE — Social Work (Signed)
[facility] received [id number], good thru 04/07/20.  Criss Alvine, [place] Social Worker

## 2020-03-09 DIAGNOSIS — R7989 Other specified abnormal findings of blood chemistry: Secondary | ICD-10-CM | POA: Diagnosis not present

## 2020-03-09 DIAGNOSIS — R627 Adult failure to thrive: Secondary | ICD-10-CM

## 2020-03-09 DIAGNOSIS — I4819 Other persistent atrial fibrillation: Secondary | ICD-10-CM | POA: Diagnosis not present

## 2020-03-09 DIAGNOSIS — M25512 Pain in left shoulder: Secondary | ICD-10-CM

## 2020-03-09 DIAGNOSIS — G9340 Encephalopathy, unspecified: Secondary | ICD-10-CM | POA: Diagnosis not present

## 2020-03-09 LAB — COMPREHENSIVE METABOLIC PANEL
ALT: 86 U/L — ABNORMAL HIGH (ref 0–44)
AST: 59 U/L — ABNORMAL HIGH (ref 15–41)
Albumin: 3.2 g/dL — ABNORMAL LOW (ref 3.5–5.0)
Alkaline Phosphatase: 102 U/L (ref 38–126)
Anion gap: 9 (ref 5–15)
BUN: 11 mg/dL (ref 6–20)
CO2: 24 mmol/L (ref 22–32)
Calcium: 9.4 mg/dL (ref 8.9–10.3)
Chloride: 98 mmol/L (ref 98–111)
Creatinine, Ser: 1.62 mg/dL — ABNORMAL HIGH (ref 0.44–1.00)
GFR calc Af Amer: 40 mL/min — ABNORMAL LOW (ref >60)
GFR calc non Af Amer: 35 mL/min — ABNORMAL LOW (ref >60)
Glucose, Bld: 194 mg/dL — ABNORMAL HIGH (ref 70–99)
Potassium: 4.5 mmol/L (ref 3.5–5.1)
Sodium: 131 mmol/L — ABNORMAL LOW (ref 135–145)
Total Bilirubin: 0.8 mg/dL (ref 0.3–1.2)
Total Protein: 6.2 g/dL — ABNORMAL LOW (ref 6.5–8.1)

## 2020-03-09 LAB — PHOSPHORUS: Phosphorus: 2.7 mg/dL (ref 2.5–4.6)

## 2020-03-09 LAB — MAGNESIUM: Magnesium: 2 mg/dL (ref 1.7–2.4)

## 2020-03-09 NOTE — Progress Notes (Addendum)
PROGRESS NOTE    Brianna Porter  GLO:[id number] DOB: 12-26-1960 DOA: 03/04/2020 PCP: Cher Nakai, MD    Chief Complaint  Patient presents with  . Weakness  . right leg numbness    Brief Narrative:  Brief Narrative:  59 year old with a history of atrial fibrillation on Eliquis, nonischemic cardiomyopathy, depression with anxiety, chronic anemia, and HTN who presented to the ED 7/17 with bilateral lower extremity paresthesias, right hand weakness, intermittent limb jerking, and convulsions.  She reportedly generalized seizure-like episode roughly 2 years ago, and a second similar episode 3 days ago after which she refused transport to the hospital.  She stated that her paresthesias have been present for 3 to 4 days and involve both feet and her right hand.  She felt that her right hand had been weak and lacking coordination for 2 days causing her to drop things.  She reports progressive worsening of generalized weakness such that she transition from using a cane to walk to using a walker, and is now fully dependent upon a wheelchair.  In the ED CT head was unrevealing for acute abnormalities.  MRI was negative for acute abnormalities.  Vital signs were otherwise stable.  Significant Events: 7/17 admit via [facility] 7/17 EEG no evidence of seizure or epileptiform discharges  Antimicrobials:  None  Subjective:  Appear weak and drowsy, oriented x3, she states feeling tired and wants to sleep Report chronic left shoulder pain, not able to lift left arm above left shoulder  Assessment & Plan:   Principal Problem:   Encephalopathy Active Problems:   Type 2 diabetes mellitus with complication, without long-term current use of insulin (HCC)   Anemia   CKD (chronic kidney disease) stage 3, GFR 30-59 ml/min   Persistent atrial fibrillation (HCC)   Essential hypertension   CAD (coronary artery disease)   NICM (nonischemic cardiomyopathy) (HCC)   Elevated liver function tests    Convulsions (HCC)  Severe hypothyroidism - possible early myxedema TSH markedly elevated at 74 - stopped amiodarone - free T4 very low and repeat TSH remains markedly elevated - no hypothermia, hypoglycemia, hypoventilation, or hypotension, but bradycardia and hyponatremia are present and tremors and parathesias are felt to be a neurologic manifestation - will tx as a developing myxedema in absence of coma - hold on T3 dosing given cardiac hx/difficult Afib  Continue Synthroid 100 mcg mg daily  Adrenal insufficiency  Random serum cortisol quite low at 1.5 - ACTH stim test indicates brisk adrenal response to cosyntropin dosing - begin hydrocortisone replacement with IV stress dosing x24hrs followed by oral dosing -patient vehemently denies use of steroids chronically or even acutely recently -studies appear suggestive of a tertiary or secondary adrenal insufficiency but there is no clear reason this will be the case -follow with establishment of hydrocortisone dosing -would benefit from outpatient endocrinology evaluation once stabilized Continue Cortef 2 mg twice a day  Sign/Sx complex: -- Bilateral foot paresthesias of about 3 days' duration, worse on the right -- Right hand paresthesias of about 3 days' duration -- Right hand clumsiness and weakness of about 1 day's duration -- RLE weakness of about 1 day's duration -- Intermittent jerking of individual limbs for the past 2 years - generalized convulsions x2  MRI brain with no acute abnormality noted - EEG unrevealing - MRI of cervical thoracic and lumbar spine revealed only mild lumbar spondylosis but no acute findings otherwise -folic acid level normal -B12 level normal -ammonia normal -UDS unrevealing - stopped neurontin as Neurology felt  this was contributing - sx seem to be improving slowly - perhaps these sx are also a manifestation of her mutliple endocrine derangements   Atrial fibrillation - chronic CHA2DS2-VASc 3 - resumed Eliquis  - stopped amiodarone - Cardiology has evaluated her and made recommendations for tx   Continue Toprol XL 100 mg daily, currently sinus rhythm  Hypokalemia Corrected with supplementation  CKD stage IIIb Baseline creatinine 1.36 - back to baseline w/ gentle hydration   Nonischemic systolic congestive heart failure EF 20-25% September 2020 - euvolemic at present   Depression/anxiety Mood is stable at the present time  Elevated transaminases Hepatitis panel negative Improving -recheck in a.m. Will get live [place]  HTN Blood pressure in reasonable range at present -follow trend  Left shoulder pain -subacute Patient tells me she was due to see an orthopedist for this pain in the outpatient setting -plain film XR 7/6 did not reveal an acute finding -if symptoms do not improve with use of hydrocortisone (indicated for other reasons noted above) will consider MRI to rule out ligamentous injury  Numerous skin wounds/blisters /ecchymosis  pressure Injury POA: N/A Measurement: too numerous to measure each, the largest seen was 0.5cm x 0.8cm x 0.1cm Wound bed: bleeding to red, dry Drainage (amount, consistency, odor) serosanguinous to serous Periwound: ecchymotic Dressing procedure/placement/frequency: These lesions respond to daily application of xeroform gauze, patient reports that they have recently run out of xeroform and have been using dry gauze in its place, which becomes adherent and causes more trauma upon removal.  I have provided Nursing with guidance in the care of these lesions using saline to cleanse, then xeroform antimicrobial nonadherent gauze secured with Kerlix roll gauze except to face where dressing can be secured with a silicone foam.  Failure to thrive Will need SNF placement    DVT prophylaxis:  apixaban (ELIQUIS) tablet 5 mg   Code Status:full Family Communication: patient Disposition:   Status is: Inpatient  Dispo:  Patient From: Home  Planned  Disposition: [place]  Expected discharge date: 03/11/20  Medically stable for discharge: No, getting liver US, left shoulder mri   Consultants:   Neurology on July 17  Cardiology on July 19  Wound care  Procedures:   None  Antimicrobials:   None     Objective: Vitals:   03/08/20 1502 03/08/20 1943 03/08/20 2326 03/09/20 0818  BP: (!) 146/70 (!) 147/69 (!) 143/62 (!) 156/76  Pulse: (!) 56 64 64 64  Resp: 17 16 15 18   Temp: 97.6 F (36.4 C) 97.8 F (36.6 C) 98.3 F (36.8 C) 97.9 F (36.6 C)  TempSrc: Oral Oral Oral Oral  SpO2: 99% 100% 100% 94%  Weight:      Height:       No intake or output data in the 24 hours ending 03/09/20 1125 Filed Weights   03/06/20 0334 03/07/20 0500 03/08/20 0500  Weight: 65.9 kg 70.4 kg 72.3 kg    Examination:  General exam: Chronically ill-appearing, weak, drowsy, but oriented x3 Respiratory system: Diminished at bases, no wheezing, no rales, no rhonchi, respiratory effort normal. Cardiovascular system: S1 & S2 heard, RRR. No JVD, no murmur, No pedal edema. Gastrointestinal system: Abdomen is nondistended, soft and nontender. No organomegaly or masses felt. Normal bowel sounds heard. Central nervous system: Drowsy, but oriented. No focal neurological deficits. Extremities: Generalized weakness, limited range of motion left shoulder due to pain Skin: Scattered ecchymosis, blisters Psychiatry: Flat affect, calm and cooperative    Data Reviewed: I  have personally reviewed following labs and imaging studies  CBC: Recent Labs  Lab 03/04/20 2247 03/06/20 0441 03/07/20 1223 03/08/20 0412  WBC 8.4 7.4 5.9 6.6  HGB 9.6* 9.0* 8.9* 8.6*  HCT 31.0* 28.8* 28.6* 27.5*  MCV 99.0 99.3 99.7 99.6  PLT 315 320 324 676    Basic Metabolic Panel: Recent Labs  Lab 03/04/20 2247 03/06/20 0441 03/06/20 1109 03/07/20 1223 03/08/20 0412 03/09/20 0423  NA 135 131*  --  134* 133* 131*  K 4.0 3.3*  --  3.1* 4.3 4.5  CL  95* 94*  --  95* 99 98  CO2 28 26  --  27 25 24   GLUCOSE 119* 125*  --  118* 143* 194*  BUN 12 14  --  12 12 11   CREATININE 1.36* 1.52*  --  1.36* 1.40* 1.62*  CALCIUM 9.0 9.5  --  9.0 9.0 9.4  MG  --   --  1.9  --   --  2.0  PHOS  --   --   --   --   --  2.7    GFR: Estimated Creatinine Clearance: 35.3 mL/min (A) (by C-G formula based on SCr of 1.62 mg/dL (H)).  Liver Function Tests: Recent Labs  Lab 03/04/20 2247 03/06/20 0441 03/07/20 1223 03/09/20 0423  AST 76* 60* 52* 59*  ALT 120* 94* 80* 86*  ALKPHOS 126 108 105 102  BILITOT 0.7 0.4 0.8 0.8  PROT 6.6 6.1* 6.1* 6.2*  ALBUMIN 3.7 3.2* 3.1* 3.2*    CBG: No results for input(s): GLUCAP in the last 168 hours.   Recent Results (from the past 240 hour(s))  Urine culture     Status: Abnormal   Collection Time: 03/04/20 10:56 PM   Specimen: Urine, Clean Catch  Result Value Ref Range Status   Specimen Description URINE, CLEAN CATCH  Final   Special Requests   Final    NONE Performed at [facility], [address] [place], [place] [address]    Culture MULTIPLE SPECIES PRESENT, SUGGEST RECOLLECTION (A)  Final   Report Status 03/06/2020 FINAL  Final  SARS Coronavirus 2 by RT PCR (hospital order, performed in [facility] hospital lab) Nasopharyngeal Nasopharyngeal Swab     Status: None   Collection Time: 03/05/20  6:26 AM   Specimen: Nasopharyngeal Swab  Result Value Ref Range Status   SARS Coronavirus 2 NEGATIVE NEGATIVE Final    Comment: (NOTE) SARS-CoV-2 target nucleic acids are NOT DETECTED.  The SARS-CoV-2 RNA is generally detectable in upper and lower respiratory specimens during the acute phase of infection. The lowest concentration of SARS-CoV-2 viral copies this assay can detect is 250 copies / mL. A negative result does not preclude SARS-CoV-2 infection and should not be used as the sole basis for treatment or other patient management decisions.  A negative result may occur with improper specimen  collection / handling, submission of specimen other than nasopharyngeal swab, presence of viral mutation(s) within the areas targeted by this assay, and inadequate number of viral copies (<250 copies / mL). A negative result must be combined with clinical observations, patient history, and epidemiological information.  Fact Sheet for Patients:   StrictlyIdeas.no  Fact Sheet for Healthcare Providers: BankingDealers.co.za  This test is not yet approved or  cleared by the [place] FDA and has been authorized for detection and/or diagnosis of SARS-CoV-2 by FDA under an Emergency Use Authorization (EUA).  This EUA will remain in effect (meaning this test  can be used) for the duration of the COVID-19 declaration under Section 564(b)(1) of the Act, 21 U.S.C. section 360bbb-3(b)(1), unless the authorization is terminated or revoked sooner.  Performed at [facility], [place] [address] [place], [place] [address]          Radiology Studies: ECHOCARDIOGRAM COMPLETE  Result Date: 03/08/2020    ECHOCARDIOGRAM REPORT   Patient Name:   SIDNEY SILBERMAN Date of Exam: 03/08/2020 Medical Rec #:  [id number]       Height:       62.0 in Accession #:    [id number]      Weight:       159.4 lb Date of Birth:  10-31-1960       BSA:          1.736 m Patient Age:    34 years        BP:           158/79 mmHg Patient Gender: F               HR:           59 bpm. Exam Location:  Inpatient Procedure: 2D Echo Indications:    cardiomyopathy  History:        Patient has prior history of Echocardiogram examinations, most                 recent 04/29/2019. CHF and Cardiomyopathy, CAD, Arrythmias:Atrial                 Flutter; Risk Factors:Diabetes, Hypertension, Dyslipidemia and                 Former Smoker. NICM.  Sonographer:    Jannett Celestine RDCS (AE) Referring Phys: 9 [place] Comments: Image acquisition challenging due to patient body habitus.  IMPRESSIONS  1. EF now 45-50% compared with prior. Prior echo was in Afib with RVR ~150 bpm.  2. Left ventricular ejection fraction, by estimation, is 45 to 50%. The left ventricle has mildly decreased function. The left ventricle demonstrates global hypokinesis. Left ventricular diastolic parameters are consistent with Grade II diastolic dysfunction (pseudonormalization). Elevated left atrial pressure.  3. Right ventricular systolic function is mildly reduced. The right ventricular size is normal. Tricuspid regurgitation signal is inadequate for assessing PA pressure.  4. The mitral valve is grossly normal. Trivial mitral valve regurgitation. No evidence of mitral stenosis.  5. The aortic valve is grossly normal. Aortic valve regurgitation is not visualized. No aortic stenosis is present. Comparison(s): Changes from prior study are noted. The left ventricular function has improved. FINDINGS  Left Ventricle: Left ventricular ejection fraction, by estimation, is 45 to 50%. The left ventricle has mildly decreased function. The left ventricle demonstrates global hypokinesis. The left ventricular internal cavity size was normal in size. There is  no left ventricular hypertrophy. Left ventricular diastolic parameters are consistent with Grade II diastolic dysfunction (pseudonormalization). Elevated left atrial pressure. Right Ventricle: The right ventricular size is normal. No increase in right ventricular wall thickness. Right ventricular systolic function is mildly reduced. Tricuspid regurgitation signal is inadequate for assessing PA pressure. Left Atrium: Left atrial size was normal in size. Right Atrium: Right atrial size was normal in size. Pericardium: Trivial pericardial effusion is present. Presence of pericardial fat pad. Mitral Valve: The mitral valve is grossly normal. Trivial mitral valve regurgitation. No evidence of mitral valve stenosis. Tricuspid Valve: The tricuspid valve is grossly normal. Tricuspid  valve regurgitation is not  demonstrated. No evidence of tricuspid stenosis. Aortic Valve: The aortic valve is grossly normal. Aortic valve regurgitation is not visualized. No aortic stenosis is present. Pulmonic Valve: The pulmonic valve was grossly normal. Pulmonic valve regurgitation is not visualized. No evidence of pulmonic stenosis. Aorta: The aortic root is normal in size and structure. Venous: The inferior vena cava was not well visualized. IAS/Shunts: The atrial septum is grossly normal.  LEFT VENTRICLE PLAX 2D LVIDd:         4.20 cm  Diastology LVIDs:         2.10 cm  LV e' lateral:   9.46 cm/s LV PW:         1.50 cm  LV E/e' lateral: 10.2 LV IVS:        1.00 cm  LV e' medial:    6.09 cm/s LVOT diam:     1.90 cm  LV E/e' medial:  15.8 LV SV:         55 LV SV Index:   32 LVOT Area:     2.84 cm  LEFT ATRIUM           Index LA diam:      3.40 cm 1.96 cm/m LA Vol (A4C): 25.7 ml 14.80 ml/m  AORTIC VALVE LVOT Vmax:   84.90 cm/s LVOT Vmean:  56.200 cm/s LVOT VTI:    0.193 m  AORTA Ao Root diam: 2.70 cm MITRAL VALVE MV Area (PHT): 3.37 cm    SHUNTS MV Decel Time: 225 msec    Systemic VTI:  0.19 m MV E velocity: 96.40 cm/s  Systemic Diam: 1.90 cm MV A velocity: 50.10 cm/s MV E/A ratio:  1.92 Eleonore Chiquito MD Electronically signed by Eleonore Chiquito MD Signature Date/Time: 03/08/2020/1:52:13 PM    Final         Scheduled Meds: . apixaban  5 mg Oral BID  . DULoxetine  60 mg Oral BID  . hydrocortisone  5 mg Oral BID  . levothyroxine  100 mcg Oral Q0600  . metoprolol succinate  100 mg Oral Daily  . nicotine  21 mg Transdermal Daily  . pantoprazole  40 mg Oral BID  . sodium chloride flush  3 mL Intravenous Q12H   Continuous Infusions:   LOS: 3 days     Time spent: 61mins I have personally reviewed and interpreted on  03/09/2020 daily labs, tele strips, imagings as discussed above under date review session and assessment and plans.  I reviewed all nursing notes, pharmacy notes, consultant notes,   vitals, pertinent old records  I have discussed plan of care as described above with RN , patient  on 03/09/2020  Voice Recognition /Dragon dictation system was used to create this note, attempts have been made to correct errors. Please contact the author with questions and/or clarifications.   Florencia Reasons, MD PhD FACP [facility]  Available via Epic secure chat 7am-7pm for nonurgent issues Please page for urgent issues To page the attending provider between 7A-7P or the covering provider during after hours 7P-7A, please log into the web site [email or web address] and access using universal [place] password for that web site. If you do not have the password, please call the hospital operator.    03/09/2020, 11:25 AM

## 2020-03-10 ENCOUNTER — Inpatient Hospital Stay (HOSPITAL_COMMUNITY): Payer: Medicare HMO

## 2020-03-10 DIAGNOSIS — T462X1D Poisoning by other antidysrhythmic drugs, accidental (unintentional), subsequent encounter: Secondary | ICD-10-CM

## 2020-03-10 LAB — SEDIMENTATION RATE: Sed Rate: 50 mm/hr — ABNORMAL HIGH (ref 0–22)

## 2020-03-10 LAB — COMPREHENSIVE METABOLIC PANEL
ALT: 81 U/L — ABNORMAL HIGH (ref 0–44)
AST: 55 U/L — ABNORMAL HIGH (ref 15–41)
Albumin: 2.9 g/dL — ABNORMAL LOW (ref 3.5–5.0)
Alkaline Phosphatase: 86 U/L (ref 38–126)
Anion gap: 7 (ref 5–15)
BUN: 14 mg/dL (ref 6–20)
CO2: 26 mmol/L (ref 22–32)
Calcium: 9.1 mg/dL (ref 8.9–10.3)
Chloride: 100 mmol/L (ref 98–111)
Creatinine, Ser: 1.3 mg/dL — ABNORMAL HIGH (ref 0.44–1.00)
GFR calc Af Amer: 52 mL/min — ABNORMAL LOW (ref >60)
GFR calc non Af Amer: 45 mL/min — ABNORMAL LOW (ref >60)
Glucose, Bld: 134 mg/dL — ABNORMAL HIGH (ref 70–99)
Potassium: 4.5 mmol/L (ref 3.5–5.1)
Sodium: 133 mmol/L — ABNORMAL LOW (ref 135–145)
Total Bilirubin: 0.8 mg/dL (ref 0.3–1.2)
Total Protein: 5.6 g/dL — ABNORMAL LOW (ref 6.5–8.1)

## 2020-03-10 LAB — C-REACTIVE PROTEIN: CRP: 1.2 mg/dL — ABNORMAL HIGH (ref <1.0)

## 2020-03-10 LAB — URIC ACID: Uric Acid, Serum: 4.5 mg/dL (ref 2.5–7.1)

## 2020-03-10 IMAGING — US US ABDOMEN LIMITED
1 series · 14 of 25 positions shown · non-contrast
Comparison: None.

CLINICAL DATA: Elevated LFTs

EXAM:
ULTRASOUND ABDOMEN LIMITED RIGHT UPPER QUADRANT

[Series 1: us abdomen limited · 14 of 41 slices shown]
[im 1/41]
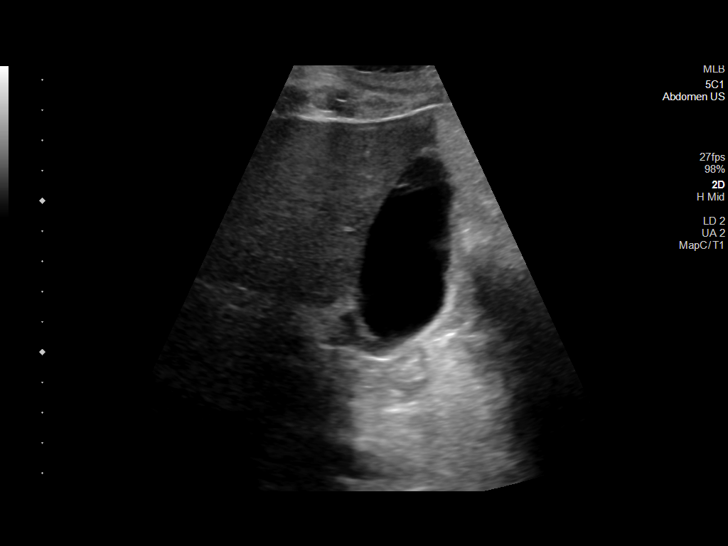
[im 4/41]
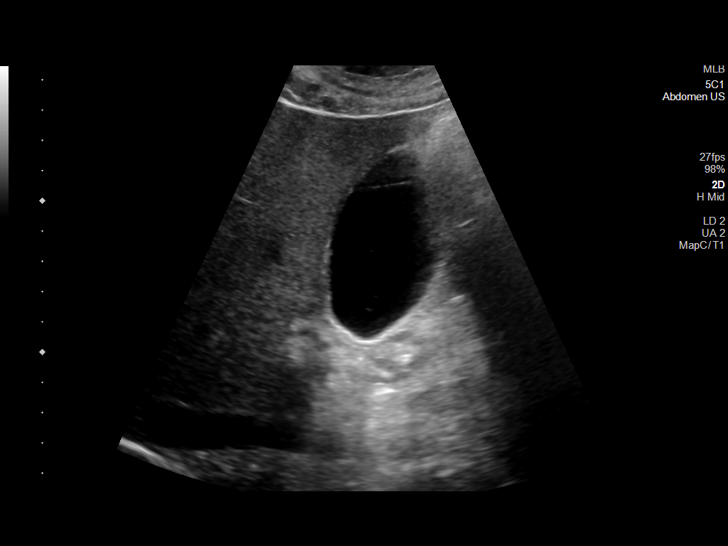
[im 7/41]
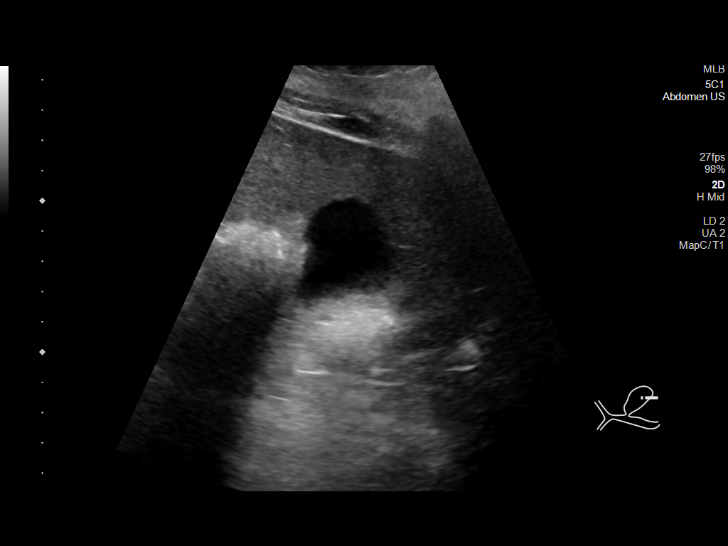
[im 11/41]
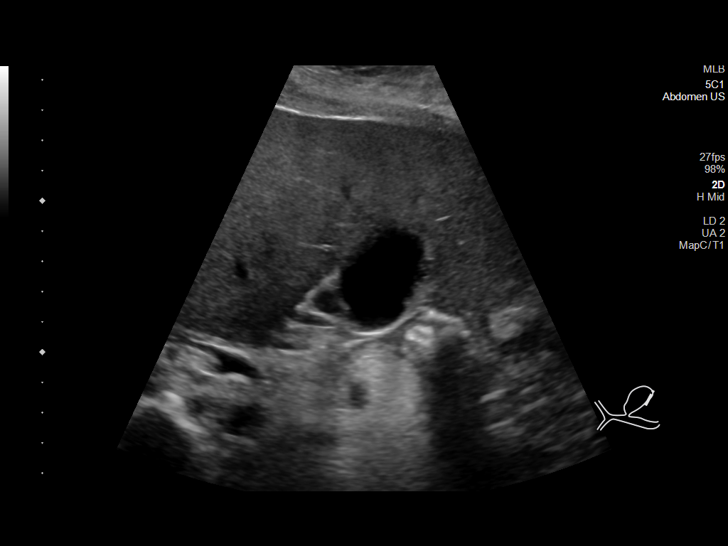
[im 14/41]
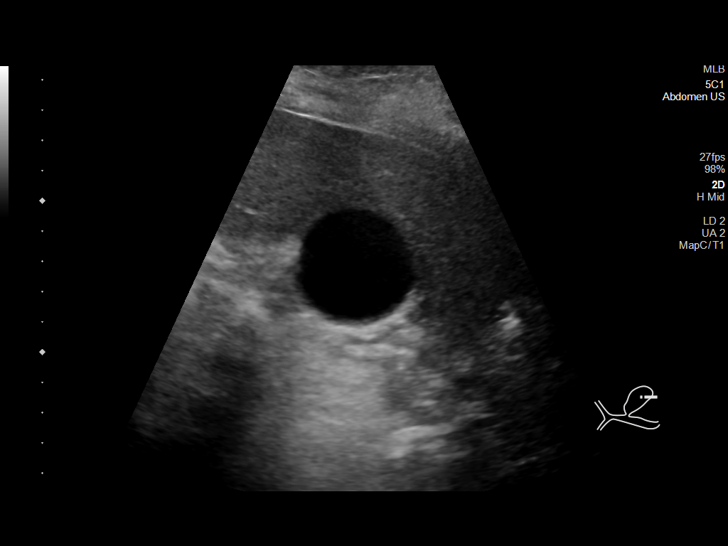
[im 16/41]
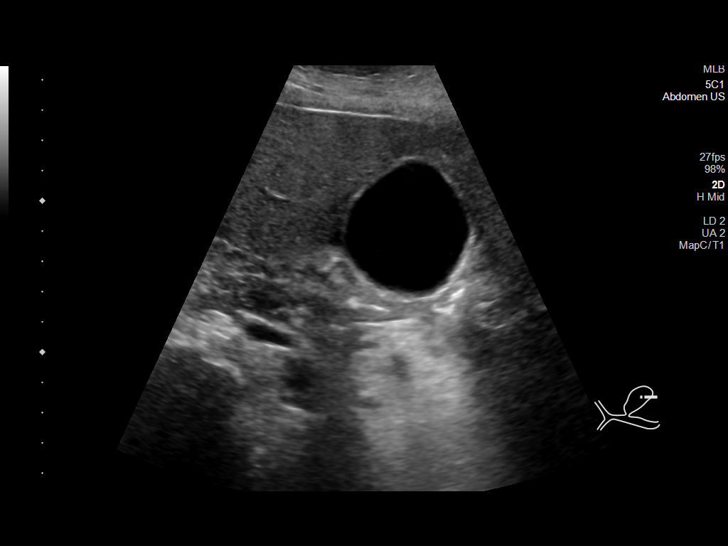
[im 19/41]
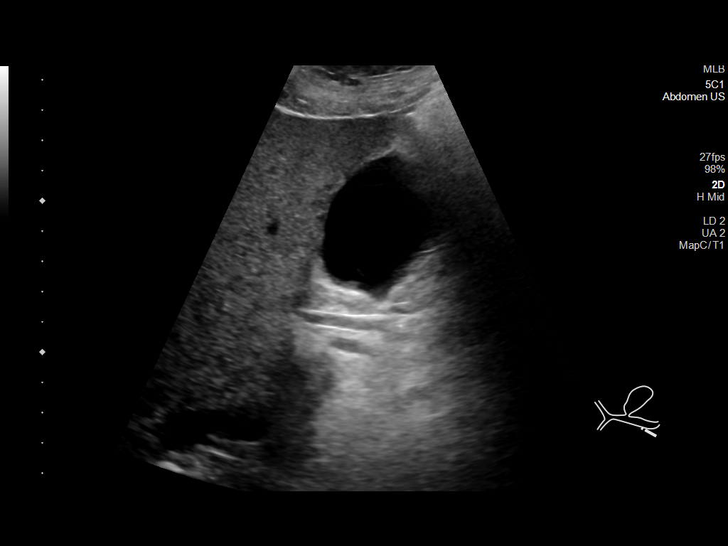
[im 22/41]
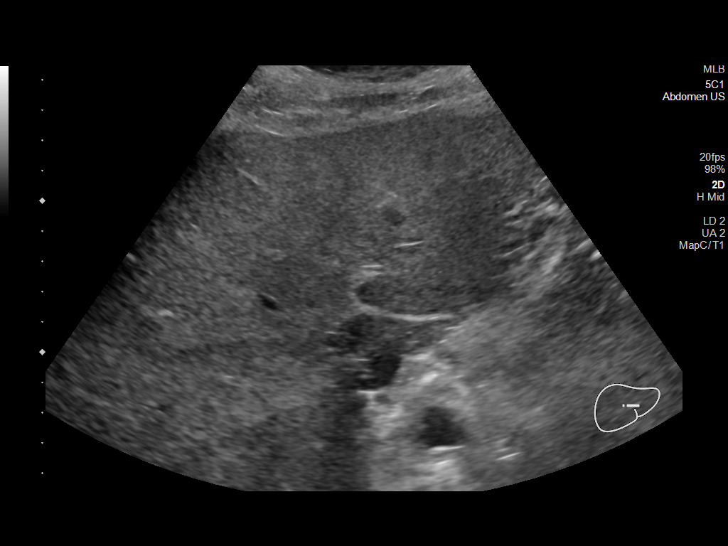
[im 26/41]
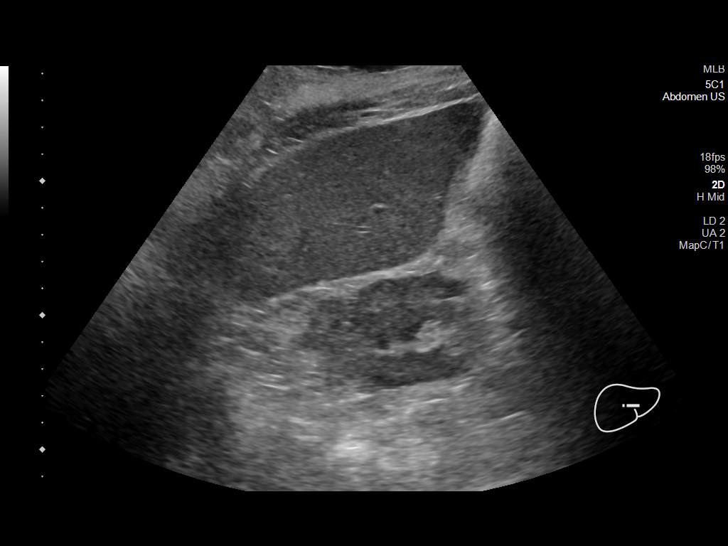
[im 27/41]
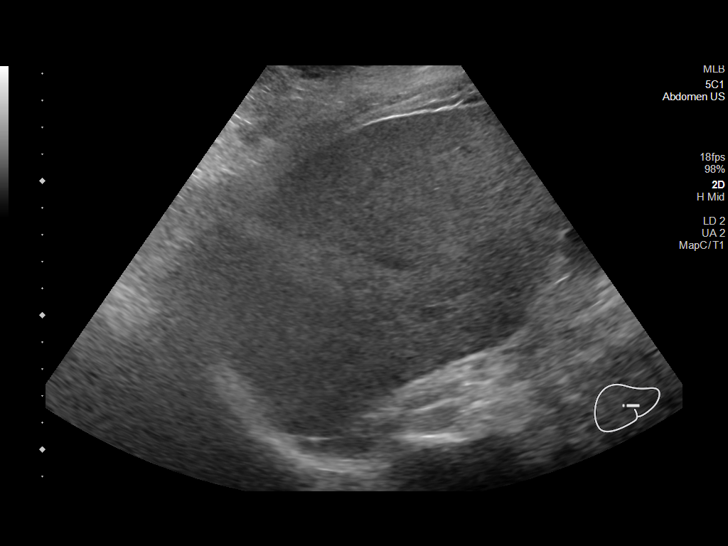
[im 31/41]
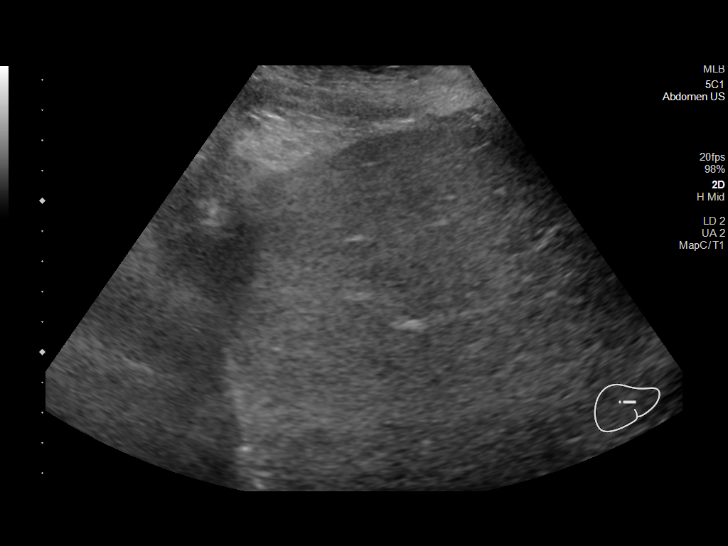
[im 34/41]
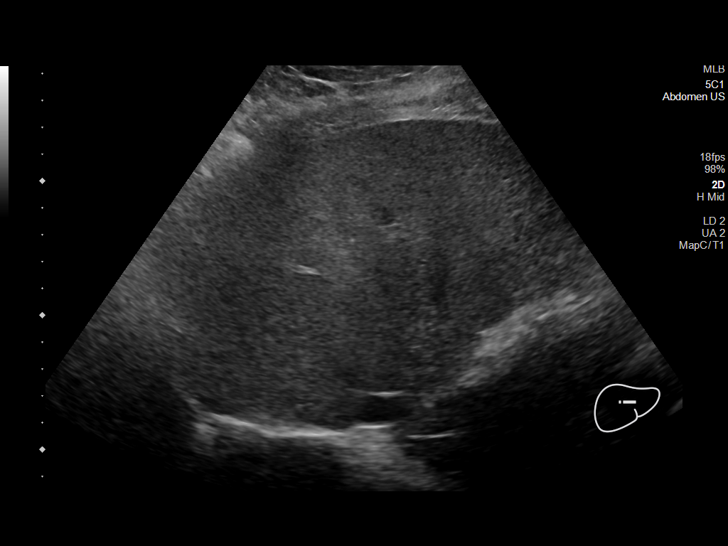
[im 37/41]
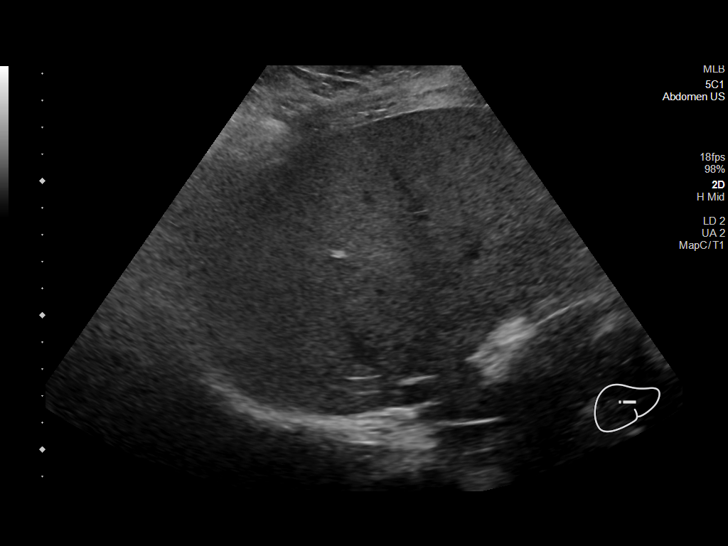
[im 41/41]
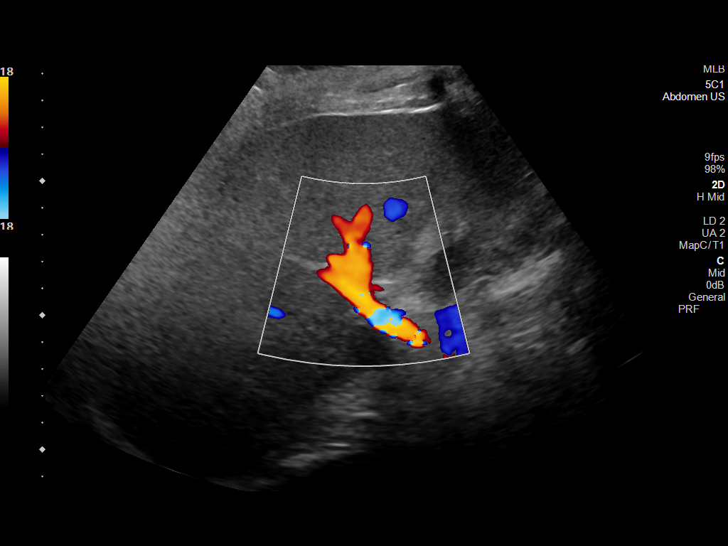

[14 of 25 positions shown; findings below may reference images not displayed]

FINDINGS: Gallbladder:

No gallstones or wall thickening visualized. No sonographic Murphy
sign noted by sonographer.

Common bile duct:

Diameter: 3.9 mm.

Liver:

Mild increased echogenicity is noted consistent with fatty
infiltration. No focal mass is seen. No significant nodularity is
noted. Portal vein is patent on color Doppler imaging with normal
direction of blood flow towards the liver.

Other: None.
IMPRESSION: Changes of fatty infiltration of the liver. No other focal
abnormality is noted.

## 2020-03-10 IMAGING — MR MR SHOULDER*L* W/O CM
4 of 6 series · 18 of 40 positions shown · non-contrast
Comparison: X-ray [DATE]

CLINICAL DATA: Shoulder pain

EXAM:
MRI OF THE LEFT SHOULDER WITHOUT CONTRAST
TECHNIQUE: Multiplanar, multisequence MR imaging of the shoulder was performed.
No intravenous contrast was administered.

[Series 3: PD fat-sat · axial · 4.0mm · 0.31mm/px · z∈[-120,-9]mm · 8 of 24 slices shown (1 of 3)]
[im 1/24]
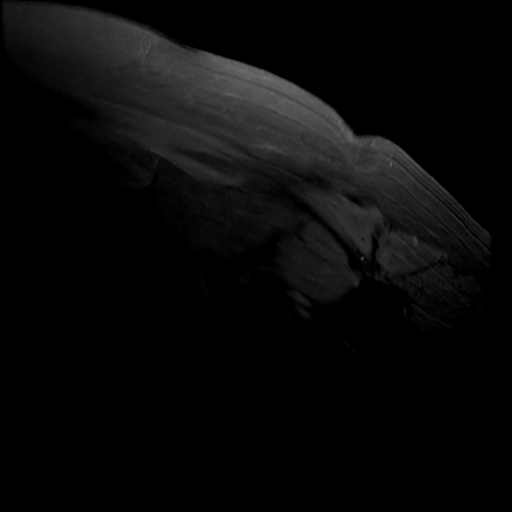
[im 4/24]
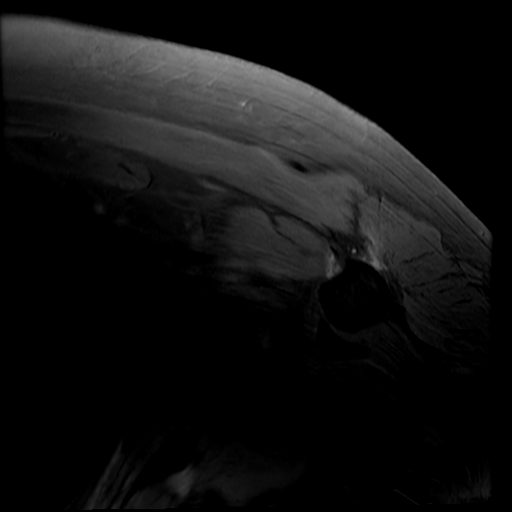
[im 7/24]
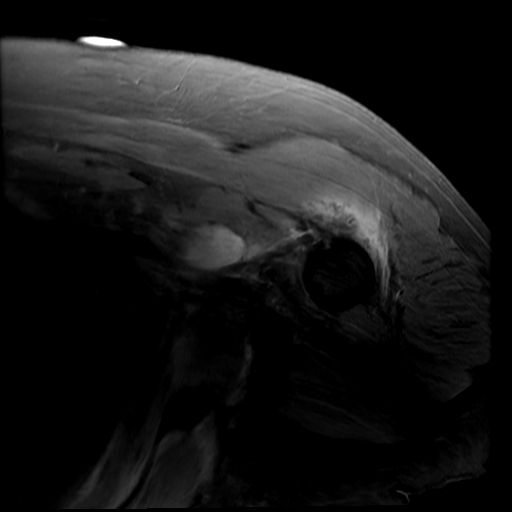
[im 10/24]
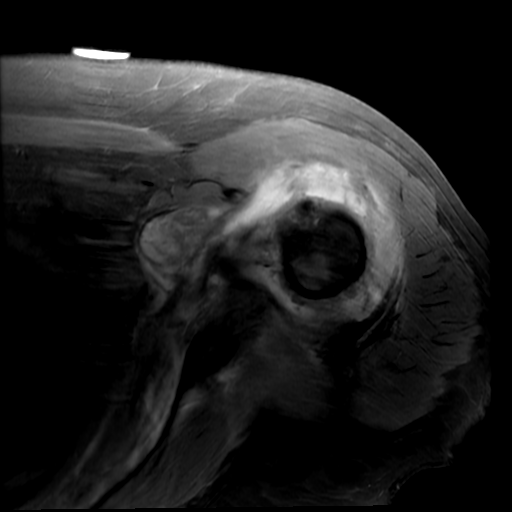
[im 14/24]
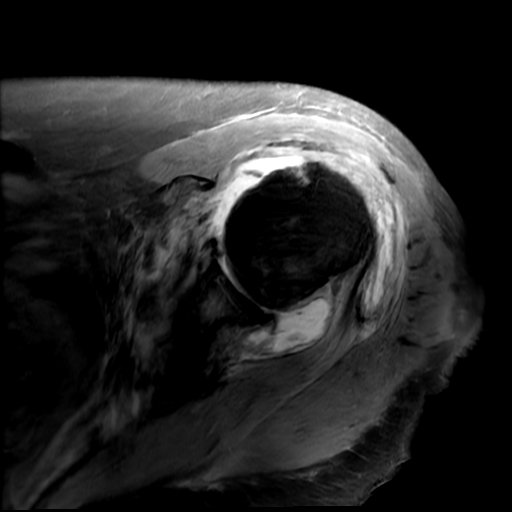
[im 17/24]
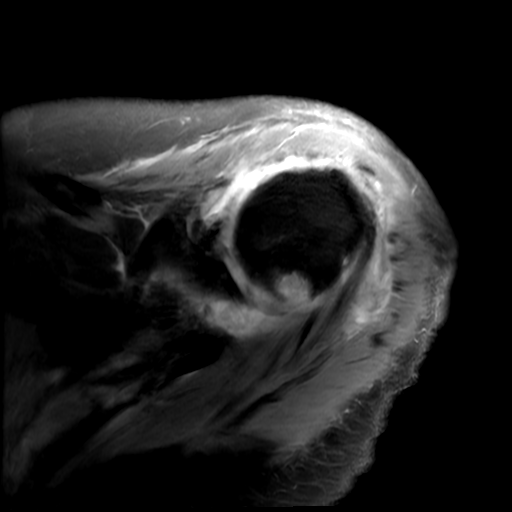
[im 20/24]
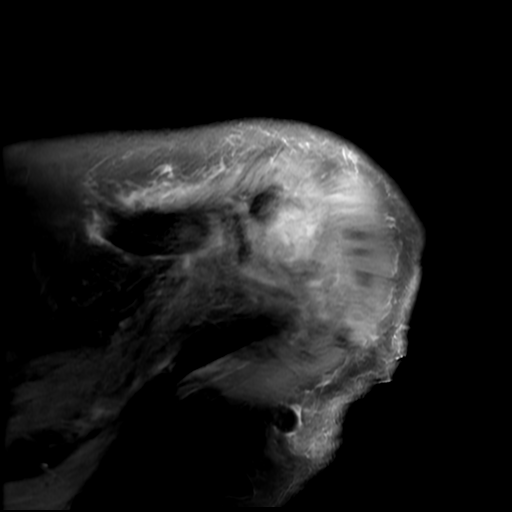
[im 24/24]
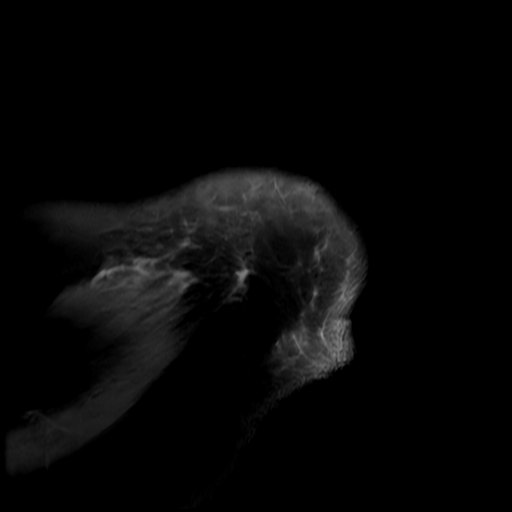

[Series 4: T2 fat-sat · oblique · 4.0mm · 0.35mm/px · 3 of 21 slices shown]
[im 5/21]
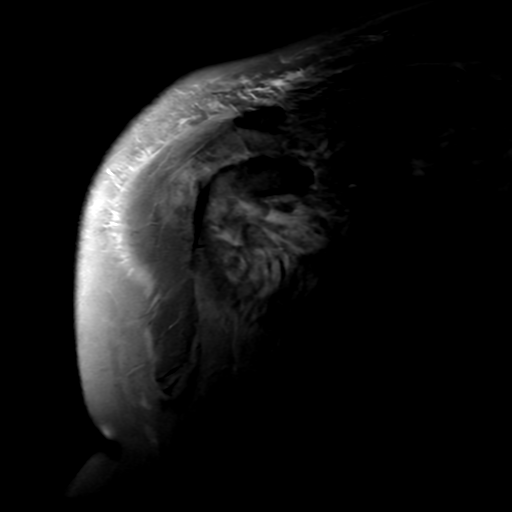
[im 13/21]
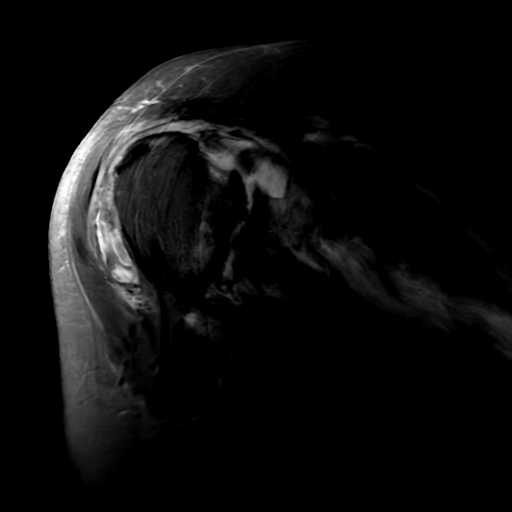
[im 21/21]
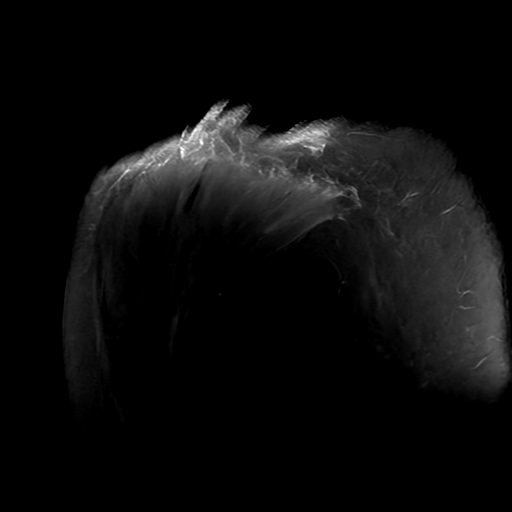

[Series 5: PD fat-sat · oblique · 4.0mm · 0.35mm/px · 4 of 21 slices shown (2 of 3)]
[im 1/21]
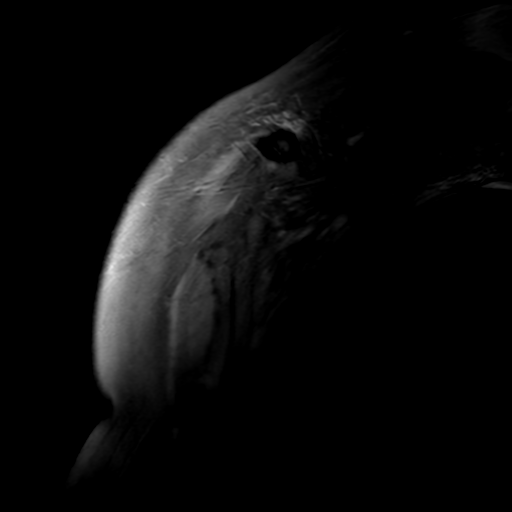
[im 5/21]
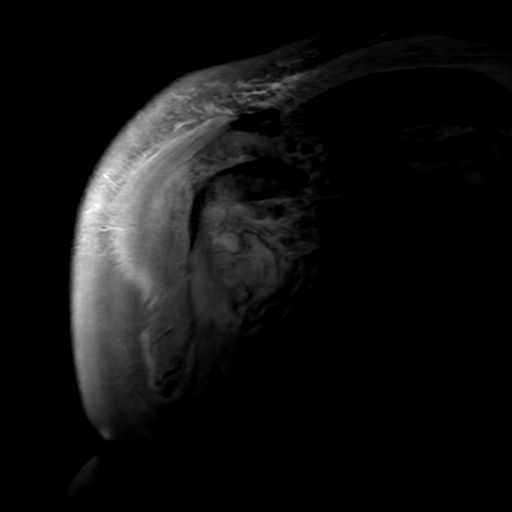
[im 13/21]
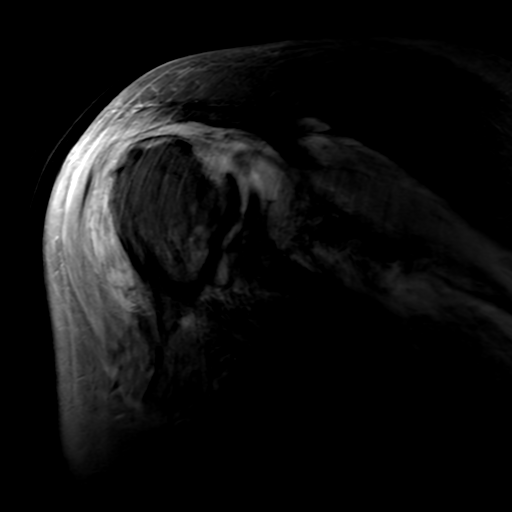
[im 21/21]
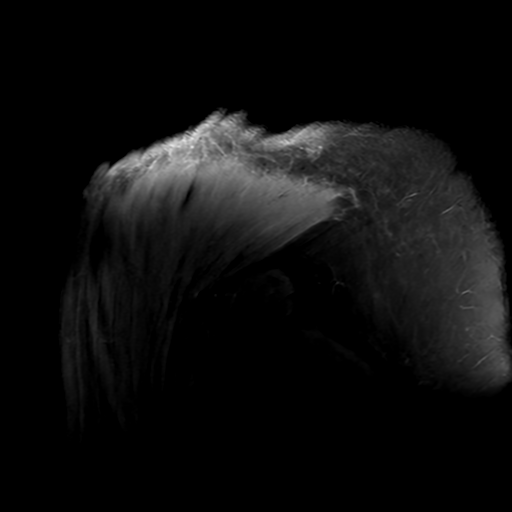

[Series 8: PD fat-sat · oblique · 4.0mm · 0.35mm/px · 3 of 21 slices shown (3 of 3)]
[im 5/21]
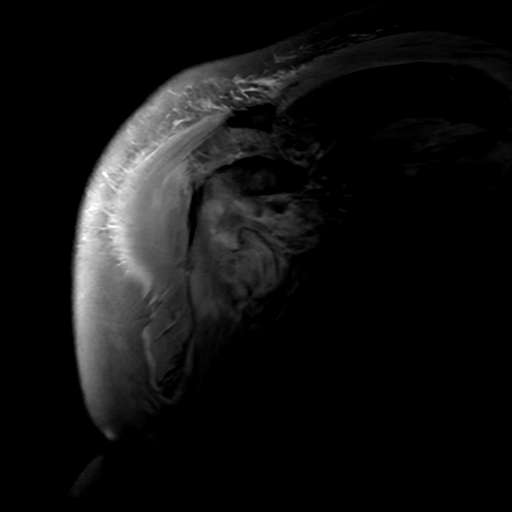
[im 13/21]
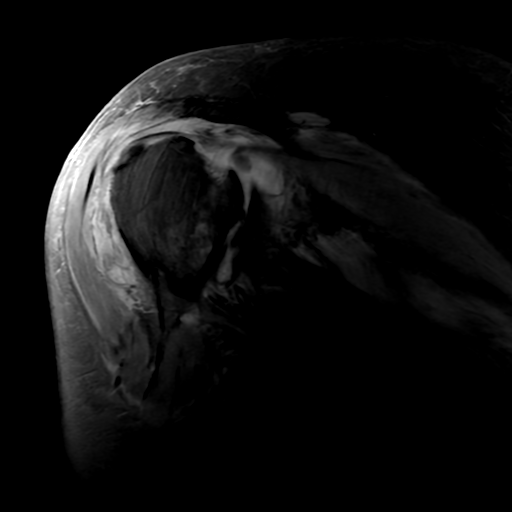
[im 21/21]
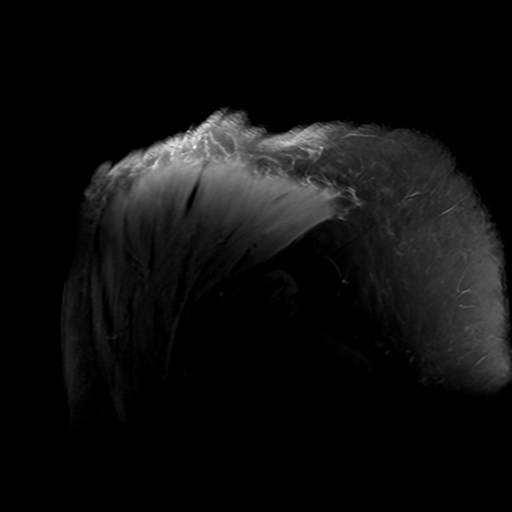

[18 of 40 positions shown; findings below may reference images not displayed]

FINDINGS: Technical note: Motion degraded examination

Rotator cuff: Complete full-thickness tear of the supraspinatus
tendon retracted to the level of the glenohumeral joint. Tear
extends to involve the interdigitating and anterior insertional
fibers of the infraspinatus tendon. The mid to posterior portions of
the infraspinatus tendon are intact but appear attenuated. Complete
full-thickness retracted tear of the subscapularis tendon. Intact
teres minor.

Muscles: Atrophy of the supraspinatus and subscapularis muscles.
Rotator cuff intramuscular edema most pronounced at the
supraspinatus and subscapularis.

Biceps long head: Torn and retracted to the level of the proximal
humeral metaphysis.

Acromioclavicular Joint: Mild arthropathy of the acromioclavicular
joint. Moderate subacromial-subdeltoid bursitis.

Glenohumeral Joint: Extensive high-grade cartilage loss of the
humeral head and glenoid. Moderate joint effusion with extensive
synovitis.

Labrum:  Circumferentially degenerated and torn.

Bones: Large erosion of the posterosuperior aspect of the humeral
head (series 3, images 7-8; series 8, images 8-11). Humeral head is
high-riding relative to the glenoid without dislocation. No acute
fracture. No suspicious marrow replacing lesion.

Other: Prominent periarticular soft tissue edema.
IMPRESSION: 1. Left shoulder joint effusion with extensive glenohumeral joint
synovitis and subacromial-subdeltoid bursitis with large erosion at
the posterosuperior humeral head. Differential includes sequela of
an inflammatory or crystalline arthropathy. Infection with septic
arthritis and humeral head osteomyelitis is not excluded and
arthrocentesis with joint fluid analysis is recommended.
2. Chronic complete full-thickness tear of the supraspinatus tendon
retracted to the level of the glenohumeral joint. Tear extends to
involve the interdigitating and anterior insertional fibers of the
infraspinatus tendon. The mid to posterior portions of the
infraspinatus tendon are intact but appear attenuated.
3. Chronic full-thickness retracted tear of the subscapularis
tendon.
4. Long head biceps tendon is torn and retracted.

These results will be called to the ordering clinician or
representative by the Radiologist Assistant, and communication
documented in the PACS or [REDACTED].

## 2020-03-10 MED ORDER — ONDANSETRON HCL 4 MG/2ML IJ SOLN: 4.0000 mg | Freq: Four times a day (QID) | INTRAMUSCULAR | Status: DC | PRN

## 2020-03-10 NOTE — Progress Notes (Signed)
Occupational Therapy Treatment Patient Details Name: Brianna Porter MRN: [id number] DOB: Jun 10, 1961 Today's Date: 03/10/2020    History of present illness Pt is a 59 y/o female admitted secondary to convulsions, weakness and numbness. MRI of the brain, cervical spine, thoracic spine and lumbar spine were all negative for any acute findings. An EEG was done and results were WNL. Further neuro workup pending. PMH including but not limited to atrial fibrillation on Eliquis, nonischemic cardiomyopathy, depression with anxiety, chronic anemia, hypertension.    OT comments  Patient continues to make steady progress towards goals in skilled OT session. Patient's session encompassed co-treat with PT in order to progress with overall activity tolerance, endurance, and progression with functional mobility. Pt remains frustrated and anxious to attempt to complete ambulation. Upon completing sit<>stands with HHA, pt unable to progress feet or provide substantial base of support in order to ambulate safely. Pt also become anxious due to increased pain and instability in R hip. Pt then utilized stedy to complete ADLs at sink, however require mod-max A at R hip in order to remain upright (RLE would buckle upon standing due to instability at hip). Pt requiring max encouragement at end of session in order to promote getting up with nursing when therapy is not present. Discharge remains appropriate at this time; will continue to follow acutely.    Follow Up Recommendations  SNF;Supervision/Assistance - 24 hour    Equipment Recommendations  Other (comment);3 in 1 bedside commode Harrel Lemon)    Recommendations for Other Services      Precautions / Restrictions Precautions Precautions: Fall Precaution Comments: very fragile skin Restrictions Weight Bearing Restrictions: No       Mobility Bed Mobility Overal bed mobility: Needs Assistance Bed Mobility: Supine to Sit     Supine to sit: Mod assist;+2 for  physical assistance;+2 for safety/equipment     General bed mobility comments: bridged with min/mod, assisted up via R UE  Transfers Overall transfer level: Needs assistance Equipment used: Rolling walker (2 wheeled);Ambulation equipment used Transfers: Sit to/from Stand Sit to Stand: Mod assist;+2 physical assistance;+2 safety/equipment         General transfer comment: cues for hand placement and improved technique, assisted forward and up with stability once upright.  6 or more trials on sit to stand within the session.    Balance Overall balance assessment: Needs assistance Sitting-balance support: Feet unsupported;Feet supported Sitting balance-Leahy Scale: Fair Sitting balance - Comments: prefers use of UE's, but able to maintain sitting on a solid surface without UE's or external support.  She cannot handle challenge.   Standing balance support: Bilateral upper extremity supported Standing balance-Leahy Scale: Poor Standing balance comment: At least 1 UE support required and /or external support.  stood 10-12 times in the STEDY                           ADL either performed or assessed with clinical judgement   ADL Overall ADL's : Needs assistance/impaired     Grooming: Oral care;Moderate assistance;Standing;Maximal assistance Grooming Details (indicate cue type and reason): Mod A in standing stedy due to R hip needing external assist to remain upright                 Toilet Transfer: Moderate assistance;+2 for physical assistance Toilet Transfer Details (indicate cue type and reason): Attempted to ambulate with 2 person HHA, unable to advance legs without scissoring due to weakness at hips; utilized stedy to complete multiple  sit<>stands (external assist provided at hips)         Functional mobility during ADLs: Moderate assistance;+2 for safety/equipment General ADL Comments: pt with decreased activity tolerance, impaired use of LUE and generalized  weakness     Vision       Perception     Praxis      Cognition Arousal/Alertness: Awake/alert Behavior During Therapy: Flat affect;Anxious (Frustrated with situation and current status) Overall Cognitive Status: Within Functional Limits for tasks assessed                                 General Comments: pt overall WFL but often perseverating on current level of function needing cues to orient back to session.        Exercises Exercises: Other exercises Other Exercises Other Exercises: Active ROM to bil LE and R UE, gentle ROM to L UE   Shoulder Instructions       General Comments vss    Pertinent Vitals/ Pain       Pain Assessment: Faces Faces Pain Scale: Hurts even more Pain Location: L shoulder, R knee, hips, generalized moaning with movement Pain Descriptors / Indicators: Guarding;Grimacing;Moaning Pain Intervention(s): Monitored during session;Repositioned;Limited activity within patient's tolerance  Home Living                                          Prior Functioning/Environment              Frequency  Min 2X/week        Progress Toward Goals  OT Goals(current goals can now be found in the care plan section)  Progress towards OT goals: Progressing toward goals  Acute Rehab OT Goals Patient Stated Goal: to get stronger OT Goal Formulation: With patient Time For Goal Achievement: 03/19/20 Potential to Achieve Goals: Good  Plan Discharge plan remains appropriate;Frequency remains appropriate    Co-evaluation      Reason for Co-Treatment: For patient/therapist safety;To address functional/ADL transfers PT goals addressed during session: Mobility/safety with mobility OT goals addressed during session: ADL's and self-care;Strengthening/ROM      AM-PAC OT "6 Clicks" Daily Activity     Outcome Measure   Help from another person eating meals?: A Little Help from another person taking care of personal  grooming?: A Little Help from another person toileting, which includes using toliet, bedpan, or urinal?: A Lot Help from another person bathing (including washing, rinsing, drying)?: A Lot Help from another person to put on and taking off regular upper body clothing?: A Lot Help from another person to put on and taking off regular lower body clothing?: Total 6 Click Score: 13    End of Session Equipment Utilized During Treatment: Gait belt;Other (comment) Charlaine Dalton)  OT Visit Diagnosis: Unsteadiness on feet (R26.81);Other abnormalities of gait and mobility (R26.89);Muscle weakness (generalized) (M62.81);Other symptoms and signs involving cognitive function;Other symptoms and signs involving the nervous system (R29.898)   Activity Tolerance Patient tolerated treatment well   Patient Left in chair;with call bell/phone within reach;with chair alarm set   Nurse Communication Mobility status;Other (comment) (stedy back to bed)        Time: 1251-1330 OT Time Calculation (min): 39 min  Charges: OT General Charges $OT Visit: 1 Visit OT Treatments $Self Care/Home Management : 8-22 mins  Corinne Ports E. Zamir Staples, COTA/L Acute Rehabilitation  Services [phone]   Ascencion Dike 03/10/2020, 2:55 PM

## 2020-03-10 NOTE — Progress Notes (Addendum)
PROGRESS NOTE    Brianna Porter  KCL:[id number] DOB: [birth year] DOA: 03/04/2020 PCP: Cher Nakai, MD    Chief Complaint  Patient presents with  . Weakness  . right leg numbness    Brief Narrative:  Brief Narrative:  59 year old with a history of atrial fibrillation on Eliquis, nonischemic cardiomyopathy, depression with anxiety, chronic anemia, and HTN who presented to the ED 7/17 with bilateral lower extremity paresthesias, right hand weakness, intermittent limb jerking, and convulsions.  She reportedly generalized seizure-like episode roughly 2 years ago, and a second similar episode 3 days ago after which she refused transport to the hospital.  She stated that her paresthesias have been present for 3 to 4 days and involve both feet and her right hand.  She felt that her right hand had been weak and lacking coordination for 2 days causing her to drop things.  She reports progressive worsening of generalized weakness such that she transition from using a cane to walk to using a walker, and is now fully dependent upon a wheelchair.  In the ED CT head was unrevealing for acute abnormalities.  MRI was negative for acute abnormalities.  Vital signs were otherwise stable.  Significant Events: 7/17 admit via [place] 7/17 EEG no evidence of seizure or epileptiform discharges  Antimicrobials:  None  Subjective:  She continues appear to be weak, but more awake and interactive compared to yesterday morning oriented x3 Report chronic left shoulder pain, not able to lift left arm above left shoulder  Assessment & Plan:   Principal Problem:   Encephalopathy Active Problems:   Type 2 diabetes mellitus with complication, without long-term current use of insulin (HCC)   Anemia   CKD (chronic kidney disease) stage 3, GFR 30-59 ml/min   Persistent atrial fibrillation (HCC)   Essential hypertension   CAD (coronary artery disease)   NICM (nonischemic cardiomyopathy) (HCC)   Elevated liver  function tests   Convulsions (HCC)  Severe hypothyroidism - possible early myxedema TSH markedly elevated at 74 - stopped amiodarone - free T4 very low and repeat TSH remains markedly elevated - no hypothermia, hypoglycemia, hypoventilation, or hypotension, but bradycardia and hyponatremia are present and tremors and parathesias are felt to be a neurologic manifestation - will tx as a developing myxedema in absence of coma - hold on T3 dosing given cardiac hx/difficult Afib  Continue Synthroid 100 mcg mg daily  Adrenal insufficiency  Random serum cortisol quite low at 1.5 - ACTH stim test indicates brisk adrenal response to cosyntropin dosing - begin hydrocortisone replacement with IV stress dosing x24hrs followed by oral dosing -patient vehemently denies use of steroids chronically or even acutely recently -studies appear suggestive of a tertiary or secondary adrenal insufficiency but there is no clear reason this will be the case -follow with establishment of hydrocortisone dosing -would benefit from outpatient endocrinology evaluation once stabilized Continue Cortef 2 mg twice a day  Sign/Sx complex: -- Bilateral foot paresthesias of about 3 days' duration, worse on the right -- Right hand paresthesias of about 3 days' duration -- Right hand clumsiness and weakness of about 1 day's duration -- RLE weakness of about 1 day's duration -- Intermittent jerking of individual limbs for the past 2 years - generalized convulsions x2  MRI brain with no acute abnormality noted - EEG unrevealing - MRI of cervical thoracic and lumbar spine revealed only mild lumbar spondylosis but no acute findings otherwise -folic acid level normal -B12 level normal -ammonia normal -UDS unrevealing - stopped neurontin  as Neurology felt this was contributing - sx seem to be improving slowly - perhaps these sx are also a manifestation of her mutliple endocrine derangements   Atrial fibrillation - chronic CHA2DS2-VASc 3  - resumed Eliquis - stopped amiodarone due to qtc prolongation (cardiology recommended should not ever restart on amiodarone again please see cardiology note from 7/19 and 7/22)-   Continue Toprol XL 100 mg daily, currently sinus rhythm with intermittent sinus bradycardia and first-degree AV block -Cardiology has signed off on 7/22, recommend follow-up with Dr. Bettina Gavia in 2 to 3 weeks.  Nonischemic systolic congestive heart failure EF 20-25% September 2020 -repeat echo with improved left ventricular EF to 45 to 50%  euvolemic at present  Cardiology consider ending losartan if renal function allows, will follow cardiology recommendation  Elevated transaminases Hepatitis panel negative  liver US "Changes of fatty infiltration of the liver. No other focal abnormality is noted." Could be due to amiodarone as well, as currently patient does not appear to have acute heart failure less likely from liver congestion.  Hypokalemia Corrected with supplementation  HTN Blood pressure in reasonable range at present -follow trend  CKD stage IIIb/anemia of chronic disease Baseline creatinine 1.36 - back to baseline w/ gentle hydration    Depression/anxiety Mood is stable at the present time   Left shoulder pain -subacute Patient tells me she was due to see an orthopedist in [place] for this pain in the outpatient setting -plain film XR 7/6 did not reveal an acute finding  MRI left shoulder obtained due to persistent symptoms, case discussed with orthopedic surgeon on-call Dr. Erlinda Hong who reviewed MRI and advised that  MRI is consistent with chronic rotator cuff arthropathy  She has been on oral hydrocortisone (indicated for other reasons noted above), but symptom persists, may benefit from shoulder injection will discuss with patient.  Numerous skin wounds/blisters /ecchymosis  pressure Injury POA: N/A Measurement: too numerous to measure each, the largest seen was 0.5cm x 0.8cm x 0.1cm Wound  bed: bleeding to red, dry Drainage (amount, consistency, odor) serosanguinous to serous Periwound: ecchymotic Dressing procedure/placement/frequency: These lesions respond to daily application of xeroform gauze, patient reports that they have recently run out of xeroform and have been using dry gauze in its place, which becomes adherent and causes more trauma upon removal.  I have provided Nursing with guidance in the care of these lesions using saline to cleanse, then xeroform antimicrobial nonadherent gauze secured with Kerlix roll gauze except to face where dressing can be secured with a silicone foam.  Failure to thrive need SNF placement    DVT prophylaxis:  apixaban (ELIQUIS) tablet 5 mg   Code Status:full Family Communication: patient Disposition:   Status is: Inpatient  Dispo:  Patient From: Home  Planned Disposition: [place]  Expected discharge date: 03/11/20  Medically stable for discharge: Getting orthopedic consult, may need shoulder injection   Consultants:   Neurology on July 17  Cardiology on July 19  Wound care  Orthopedic  Procedures:   None  Antimicrobials:   None     Objective: Vitals:   03/09/20 1951 03/09/20 2344 03/10/20 0406 03/10/20 0915  BP: 131/64 (!) 147/73 (!) 150/67 130/75  Pulse: 63 (!) 58 (!) 59 (!) 57  Resp: 16 17 16 18   Temp: 97.9 F (36.6 C) 98 F (36.7 C) 98.4 F (36.9 C) 97.8 F (36.6 C)  TempSrc: Oral Oral Oral Oral  SpO2: 100% 100% 100% 100%  Weight:  Height:        Intake/Output Summary (Last 24 hours) at 03/10/2020 1022 Last data filed at 03/09/2020 2100 Gross per 24 hour  Intake 120 ml  Output --  Net 120 ml   Filed Weights   03/06/20 0334 03/07/20 0500 03/08/20 0500  Weight: 65.9 kg 70.4 kg 72.3 kg    Examination:  General exam: Chronically ill-appearing, weak, alert, oriented x3 Respiratory system: Diminished at bases, no wheezing, no rales, no rhonchi, respiratory effort  normal. Cardiovascular system: S1 & S2 heard, RRR. No JVD, no murmur, No pedal edema. Gastrointestinal system: Abdomen is nondistended, soft and nontender. No organomegaly or masses felt. Normal bowel sounds heard. Central nervous system:  orientedx3. No focal neurological deficits. Extremities: Generalized weakness, limited range of motion left shoulder due to pain Skin: Scattered ecchymosis, blisters Psychiatry: Pleasant, calm and cooperative    Data Reviewed: I have personally reviewed following labs and imaging studies  CBC: Recent Labs  Lab 03/04/20 2247 03/06/20 0441 03/07/20 1223 03/08/20 0412  WBC 8.4 7.4 5.9 6.6  HGB 9.6* 9.0* 8.9* 8.6*  HCT 31.0* 28.8* 28.6* 27.5*  MCV 99.0 99.3 99.7 99.6  PLT 315 320 324 024    Basic Metabolic Panel: Recent Labs  Lab 03/06/20 0441 03/06/20 1109 03/07/20 1223 03/08/20 0412 03/09/20 0423 03/10/20 0541  NA 131*  --  134* 133* 131* 133*  K 3.3*  --  3.1* 4.3 4.5 4.5  CL 94*  --  95* 99 98 100  CO2 26  --  27 25 24 26   GLUCOSE 125*  --  118* 143* 194* 134*  BUN 14  --  12 12 11 14   CREATININE 1.52*  --  1.36* 1.40* 1.62* 1.30*  CALCIUM 9.5  --  9.0 9.0 9.4 9.1  MG  --  1.9  --   --  2.0  --   PHOS  --   --   --   --  2.7  --     GFR: Estimated Creatinine Clearance: 43.9 mL/min (A) (by C-G formula based on SCr of 1.3 mg/dL (H)).  Liver Function Tests: Recent Labs  Lab 03/04/20 2247 03/06/20 0441 03/07/20 1223 03/09/20 0423 03/10/20 0541  AST 76* 60* 52* 59* 55*  ALT 120* 94* 80* 86* 81*  ALKPHOS 126 108 105 102 86  BILITOT 0.7 0.4 0.8 0.8 0.8  PROT 6.6 6.1* 6.1* 6.2* 5.6*  ALBUMIN 3.7 3.2* 3.1* 3.2* 2.9*    CBG: No results for input(s): GLUCAP in the last 168 hours.   Recent Results (from the past 240 hour(s))  Urine culture     Status: Abnormal   Collection Time: 03/04/20 10:56 PM   Specimen: Urine, Clean Catch  Result Value Ref Range Status   Specimen Description URINE, CLEAN CATCH  Final   Special  Requests   Final    NONE Performed at [facility], [address] [place], [place] [address]    Culture MULTIPLE SPECIES PRESENT, SUGGEST RECOLLECTION (A)  Final   Report Status 03/06/2020 FINAL  Final  SARS Coronavirus 2 by RT PCR (hospital order, performed in [facility] hospital lab) Nasopharyngeal Nasopharyngeal Swab     Status: None   Collection Time: 03/05/20  6:26 AM   Specimen: Nasopharyngeal Swab  Result Value Ref Range Status   SARS Coronavirus 2 NEGATIVE NEGATIVE Final    Comment: (NOTE) SARS-CoV-2 target nucleic acids are NOT DETECTED.  The SARS-CoV-2 RNA is generally detectable in upper and lower respiratory  specimens during the acute phase of infection. The lowest concentration of SARS-CoV-2 viral copies this assay can detect is 250 copies / mL. A negative result does not preclude SARS-CoV-2 infection and should not be used as the sole basis for treatment or other patient management decisions.  A negative result may occur with improper specimen collection / handling, submission of specimen other than nasopharyngeal swab, presence of viral mutation(s) within the areas targeted by this assay, and inadequate number of viral copies (<250 copies / mL). A negative result must be combined with clinical observations, patient history, and epidemiological information.  Fact Sheet for Patients:   StrictlyIdeas.no  Fact Sheet for Healthcare Providers: BankingDealers.co.za  This test is not yet approved or  cleared by the [place] FDA and has been authorized for detection and/or diagnosis of SARS-CoV-2 by FDA under an Emergency Use Authorization (EUA).  This EUA will remain in effect (meaning this test can be used) for the duration of the COVID-19 declaration under Section 564(b)(1) of the Act, 21 U.S.C. section 360bbb-3(b)(1), unless the authorization is terminated or revoked sooner.  Performed at [facility], [address], [place], [place] [address]          Radiology Studies: MR SHOULDER LEFT WO CONTRAST  Result Date: 03/10/2020 CLINICAL DATA:  Shoulder pain EXAM: MRI OF THE LEFT SHOULDER WITHOUT CONTRAST TECHNIQUE: Multiplanar, multisequence MR imaging of the shoulder was performed. No intravenous contrast was administered. COMPARISON:  X-ray 02/23/2020 FINDINGS: Technical note: Motion degraded examination Rotator cuff: Complete full-thickness tear of the supraspinatus tendon retracted to the level of the glenohumeral joint. Tear extends to involve the interdigitating and anterior insertional fibers of the infraspinatus tendon. The mid to posterior portions of the infraspinatus tendon are intact but appear attenuated. Complete full-thickness retracted tear of the subscapularis tendon. Intact teres minor. Muscles: Atrophy of the supraspinatus and subscapularis muscles. Rotator cuff intramuscular edema most pronounced at the supraspinatus and subscapularis. Biceps long head: Torn and retracted to the level of the proximal humeral metaphysis. Acromioclavicular Joint: Mild arthropathy of the acromioclavicular joint. Moderate subacromial-subdeltoid bursitis. Glenohumeral Joint: Extensive high-grade cartilage loss of the humeral head and glenoid. Moderate joint effusion with extensive synovitis. Labrum:  Circumferentially degenerated and torn. Bones: Large erosion of the posterosuperior aspect of the humeral head (series 3, images 7-8; series 8, images 8-11). Humeral head is high-riding relative to the glenoid without dislocation. No acute fracture. No suspicious marrow replacing lesion. Other: Prominent periarticular soft tissue edema. IMPRESSION: 1. Left shoulder joint effusion with extensive glenohumeral joint synovitis and subacromial-subdeltoid bursitis with large erosion at the posterosuperior humeral head. Differential includes sequela of an inflammatory or crystalline arthropathy. Infection with septic  arthritis and humeral head osteomyelitis is not excluded and arthrocentesis with joint fluid analysis is recommended. 2. Chronic complete full-thickness tear of the supraspinatus tendon retracted to the level of the glenohumeral joint. Tear extends to involve the interdigitating and anterior insertional fibers of the infraspinatus tendon. The mid to posterior portions of the infraspinatus tendon are intact but appear attenuated. 3. Chronic full-thickness retracted tear of the subscapularis tendon. 4. Long head biceps tendon is torn and retracted. These results will be called to the ordering clinician or representative by the Radiologist Assistant, and communication documented in the PACS or [employer]. Electronically Signed   By: Davina Poke D.O.   On: 03/10/2020 09:51   US Abdomen Limited  Result Date: 03/10/2020 CLINICAL DATA:  Elevated LFTs EXAM: ULTRASOUND ABDOMEN LIMITED RIGHT UPPER QUADRANT  COMPARISON:  None. FINDINGS: Gallbladder: No gallstones or wall thickening visualized. No sonographic Murphy sign noted by sonographer. Common bile duct: Diameter: 3.9 mm. Liver: Mild increased echogenicity is noted consistent with fatty infiltration. No focal mass is seen. No significant nodularity is noted. Portal vein is patent on color Doppler imaging with normal direction of blood flow towards the liver. Other: None. IMPRESSION: Changes of fatty infiltration of the liver. No other focal abnormality is noted. Electronically Signed   By: Inez Catalina M.D.   On: 03/10/2020 08:30   ECHOCARDIOGRAM COMPLETE  Result Date: 03/08/2020    ECHOCARDIOGRAM REPORT   Patient Name:   JESUSA STENERSON Date of Exam: 03/08/2020 Medical Rec #:  [id number]       Height:       62.0 in Accession #:    [id number]      Weight:       159.4 lb Date of Birth:  09/13/1960       BSA:          1.736 m Patient Age:    14 years        BP:           158/79 mmHg Patient Gender: F               HR:           59 bpm. Exam Location:   Inpatient Procedure: 2D Echo Indications:    cardiomyopathy  History:        Patient has prior history of Echocardiogram examinations, most                 recent 04/29/2019. CHF and Cardiomyopathy, CAD, Arrythmias:Atrial                 Flutter; Risk Factors:Diabetes, Hypertension, Dyslipidemia and                 Former Smoker. NICM.  Sonographer:    Jannett Celestine RDCS (AE) Referring Phys: [id number] [place] Comments: Image acquisition challenging due to patient body habitus. IMPRESSIONS  1. EF now 45-50% compared with prior. Prior echo was in Afib with RVR ~150 bpm.  2. Left ventricular ejection fraction, by estimation, is 45 to 50%. The left ventricle has mildly decreased function. The left ventricle demonstrates global hypokinesis. Left ventricular diastolic parameters are consistent with Grade II diastolic dysfunction (pseudonormalization). Elevated left atrial pressure.  3. Right ventricular systolic function is mildly reduced. The right ventricular size is normal. Tricuspid regurgitation signal is inadequate for assessing PA pressure.  4. The mitral valve is grossly normal. Trivial mitral valve regurgitation. No evidence of mitral stenosis.  5. The aortic valve is grossly normal. Aortic valve regurgitation is not visualized. No aortic stenosis is present. Comparison(s): Changes from prior study are noted. The left ventricular function has improved. FINDINGS  Left Ventricle: Left ventricular ejection fraction, by estimation, is 45 to 50%. The left ventricle has mildly decreased function. The left ventricle demonstrates global hypokinesis. The left ventricular internal cavity size was normal in size. There is  no left ventricular hypertrophy. Left ventricular diastolic parameters are consistent with Grade II diastolic dysfunction (pseudonormalization). Elevated left atrial pressure. Right Ventricle: The right ventricular size is normal. No increase in right ventricular wall thickness. Right  ventricular systolic function is mildly reduced. Tricuspid regurgitation signal is inadequate for assessing PA pressure. Left Atrium: Left atrial size was normal in size. Right Atrium: Right atrial size was normal in size. Pericardium: Trivial  pericardial effusion is present. Presence of pericardial fat pad. Mitral Valve: The mitral valve is grossly normal. Trivial mitral valve regurgitation. No evidence of mitral valve stenosis. Tricuspid Valve: The tricuspid valve is grossly normal. Tricuspid valve regurgitation is not demonstrated. No evidence of tricuspid stenosis. Aortic Valve: The aortic valve is grossly normal. Aortic valve regurgitation is not visualized. No aortic stenosis is present. Pulmonic Valve: The pulmonic valve was grossly normal. Pulmonic valve regurgitation is not visualized. No evidence of pulmonic stenosis. Aorta: The aortic root is normal in size and structure. Venous: The inferior vena cava was not well visualized. IAS/Shunts: The atrial septum is grossly normal.  LEFT VENTRICLE PLAX 2D LVIDd:         4.20 cm  Diastology LVIDs:         2.10 cm  LV e' lateral:   9.46 cm/s LV PW:         1.50 cm  LV E/e' lateral: 10.2 LV IVS:        1.00 cm  LV e' medial:    6.09 cm/s LVOT diam:     1.90 cm  LV E/e' medial:  15.8 LV SV:         55 LV SV Index:   32 LVOT Area:     2.84 cm  LEFT ATRIUM           Index LA diam:      3.40 cm 1.96 cm/m LA Vol (A4C): 25.7 ml 14.80 ml/m  AORTIC VALVE LVOT Vmax:   84.90 cm/s LVOT Vmean:  56.200 cm/s LVOT VTI:    0.193 m  AORTA Ao Root diam: 2.70 cm MITRAL VALVE MV Area (PHT): 3.37 cm    SHUNTS MV Decel Time: 225 msec    Systemic VTI:  0.19 m MV E velocity: 96.40 cm/s  Systemic Diam: 1.90 cm MV A velocity: 50.10 cm/s MV E/A ratio:  1.92 Eleonore Chiquito MD Electronically signed by Eleonore Chiquito MD Signature Date/Time: 03/08/2020/1:52:13 PM    Final         Scheduled Meds: . apixaban  5 mg Oral BID  . DULoxetine  60 mg Oral BID  . hydrocortisone  5 mg Oral BID   . levothyroxine  100 mcg Oral Q0600  . metoprolol succinate  100 mg Oral Daily  . nicotine  21 mg Transdermal Daily  . pantoprazole  40 mg Oral BID  . sodium chloride flush  3 mL Intravenous Q12H   Continuous Infusions:   LOS: 4 days     Time spent: 34mins I have personally reviewed and interpreted on  03/10/2020 daily labs, tele strips, imagings as discussed above under date review session and assessment and plans.  I reviewed all nursing notes, pharmacy notes, consultant notes,  vitals, pertinent old records  I have discussed plan of care as described above with RN , patient  on 03/10/2020  Voice Recognition /Dragon dictation system was used to create this note, attempts have been made to correct errors. Please contact the author with questions and/or clarifications.   Florencia Reasons, MD PhD FACP [facility]  Available via Epic secure chat 7am-7pm for nonurgent issues Please page for urgent issues To page the attending provider between 7A-7P or the covering provider during after hours 7P-7A, please log into the web site [email or web address] and access using universal [place] password for that web site. If you do not have the password, please call the hospital operator.    03/10/2020, 10:22 AM

## 2020-03-10 NOTE — Progress Notes (Signed)
Spoke with Dr. Florencia Reasons about recent MRI findings.  They are consistent with chronic rotator cuff arthropathy.  No clinical findings to suggest infection.  If patient is significantly symptomatic, would recommend IR aspiration of effusion and injection of corticosteroid.  This can be done as an outpatient at my office as well if she chooses.    Azucena Cecil, MD [facility] [phone] 4:13 PM

## 2020-03-10 NOTE — Progress Notes (Signed)
Physical Therapy Treatment Patient Details Name: Brianna Porter MRN: [id number] DOB: September 24, 1960 Today's Date: 03/10/2020    History of Present Illness Pt is a 59 y/o female admitted secondary to convulsions, weakness and numbness. MRI of the brain, cervical spine, thoracic spine and lumbar spine were all negative for any acute findings. An EEG was done and results were WNL. Further neuro workup pending. PMH including but not limited to atrial fibrillation on Eliquis, nonischemic cardiomyopathy, depression with anxiety, chronic anemia, hypertension.     PT Comments    Pt will to participate in OOB activity.  She needed occasional redirection and was perseverative on he medical status.  Emphasis on transitions, sit to stand, standing activity at the sink and assessing pre gait status.  Follow Up Recommendations  SNF     Equipment Recommendations  Other (comment) (TBD)    Recommendations for Other Services       Precautions / Restrictions Precautions Precautions: Fall Precaution Comments: very fragile skin    Mobility  Bed Mobility Overal bed mobility: Needs Assistance Bed Mobility: Supine to Sit     Supine to sit: Mod assist;+2 for physical assistance;+2 for safety/equipment     General bed mobility comments: bridged with min/mod, assisted up via R UE  Transfers Overall transfer level: Needs assistance Equipment used: Rolling walker (2 wheeled);Ambulation equipment used Transfers: Sit to/from Stand Sit to Stand: Mod assist;+2 physical assistance;+2 safety/equipment         General transfer comment: cues for hand placement and improved technique, assisted forward and up with stability once upright.  6 or more trials on sit to stand within the session.  Ambulation/Gait             General Gait Details: advanced each foot with significant support.  R LE advance into a severe adducted position.    Stairs             Wheelchair Mobility    Modified  Rankin (Stroke Patients Only)       Balance Overall balance assessment: Needs assistance Sitting-balance support: Feet unsupported;Feet supported Sitting balance-Leahy Scale: Fair Sitting balance - Comments: prefers use of UE's, but able to maintain sitting on a solid surface without UE's or external support.  She cannot handle challenge.   Standing balance support: Bilateral upper extremity supported Standing balance-Leahy Scale: Poor Standing balance comment: At least 1 UE support required and /or external support.  stood 4-5 times in the [employer]                            Cognition Arousal/Alertness: Awake/alert Behavior During Therapy: Flat affect (upset and angry at this situation) Overall Cognitive Status:  (NT formally, followed basic direction well.)                                 General Comments: pt overall WFL but often perseverating on current level of function needing cues to orient back to session.      Exercises Other Exercises Other Exercises: Active ROM to bil LE and R UE, gentle ROM to L UE    General Comments General comments (skin integrity, edema, etc.): vss      Pertinent Vitals/Pain Pain Assessment: Faces Faces Pain Scale: Hurts even more Pain Location: L shoulder, R knee, hips, generalized moaning with movement Pain Descriptors / Indicators: Guarding;Grimacing;Moaning Pain Intervention(s): Monitored during session    Home Living  Prior Function            PT Goals (current goals can now be found in the care plan section) Acute Rehab PT Goals Patient Stated Goal: to get stronger PT Goal Formulation: With patient Time For Goal Achievement: 03/19/20 Potential to Achieve Goals: Fair Progress towards PT goals: Progressing toward goals    Frequency    Min 2X/week      PT Plan Current plan remains appropriate    Co-evaluation PT/OT/SLP Co-Evaluation/Treatment: Yes Reason for  Co-Treatment: For patient/therapist safety;To address functional/ADL transfers PT goals addressed during session: Mobility/safety with mobility OT goals addressed during session: ADL's and self-care;Strengthening/ROM      AM-PAC PT "6 Clicks" Mobility   Outcome Measure  Help needed turning from your back to your side while in a flat bed without using bedrails?: A Lot Help needed moving from lying on your back to sitting on the side of a flat bed without using bedrails?: A Lot Help needed moving to and from a bed to a chair (including a wheelchair)?: Total Help needed standing up from a chair using your arms (e.g., wheelchair or bedside chair)?: A Lot Help needed to walk in hospital room?: Total Help needed climbing 3-5 steps with a railing? : Total 6 Click Score: 9    End of Session   Activity Tolerance: Patient limited by fatigue;Patient limited by pain Patient left: in chair;with call bell/phone within reach;with chair alarm set Nurse Communication: Mobility status PT Visit Diagnosis: Other abnormalities of gait and mobility (R26.89);Muscle weakness (generalized) (M62.81)     Time: 1251-1330 PT Time Calculation (min) (ACUTE ONLY): 39 min  Charges:  $Therapeutic Activity: 23-37 mins                     03/10/2020  Ginger Carne., PT Acute Rehabilitation Services [phone]  (pager) [phone]  (office)   Brianna Porter 03/10/2020, 2:00 PM

## 2020-03-10 NOTE — Progress Notes (Signed)
Radiology notified this RN about pt MRI findings of L shoulder. MD F. Xu notified.

## 2020-03-10 NOTE — Progress Notes (Signed)
Progress Note  Patient Name: Brianna Porter Date of Encounter: 03/10/2020  [facility] Cardiologist: Shirlee More, MD   Subjective   She feels better today, denies SOB.   Inpatient Medications    Scheduled Meds: . apixaban  5 mg Oral BID  . DULoxetine  60 mg Oral BID  . hydrocortisone  5 mg Oral BID  . levothyroxine  100 mcg Oral Q0600  . metoprolol succinate  100 mg Oral Daily  . nicotine  21 mg Transdermal Daily  . pantoprazole  40 mg Oral BID  . sodium chloride flush  3 mL Intravenous Q12H   Continuous Infusions:  PRN Meds: acetaminophen, ALPRAZolam, benzocaine, oxyCODONE, polyethylene glycol   Vital Signs    Vitals:   03/09/20 1951 03/09/20 2344 03/10/20 0406 03/10/20 0915  BP: 131/64 (!) 147/73 (!) 150/67 130/75  Pulse: 63 (!) 58 (!) 59 (!) 57  Resp: 16 17 16 18   Temp: 97.9 F (36.6 C) 98 F (36.7 C) 98.4 F (36.9 C) 97.8 F (36.6 C)  TempSrc: Oral Oral Oral Oral  SpO2: 100% 100% 100% 100%  Weight:      Height:        Intake/Output Summary (Last 24 hours) at 03/10/2020 0919 Last data filed at 03/09/2020 2100 Gross per 24 hour  Intake 120 ml  Output --  Net 120 ml   Last 3 Weights 03/08/2020 03/07/2020 03/06/2020  Weight (lbs) 159 lb 6.3 oz 155 lb 3.3 oz 145 lb 4.5 oz  Weight (kg) 72.3 kg 70.4 kg 65.9 kg      Telemetry    SB, PVCs - Personally Reviewed  ECG    No new tracings - Personally Reviewed  Physical Exam  In bed, NAD, cushingoid face GEN: No acute distress.   Neck: No JVD Cardiac: RRR, 2/6 systolic murmur, rubs, or gallops.  Respiratory: Clear to auscultation bilaterally. GI: Soft, nontender, non-distended  MS: No edema; multiple bruises and skin lesions. Neuro:  Nonfocal  Psych: Normal affect   Labs    High Sensitivity Troponin:  No results for input(s): TROPONINIHS in the last 720 hours.    Chemistry Recent Labs  Lab 03/07/20 1223 03/07/20 1223 03/08/20 0412 03/09/20 0423 03/10/20 0541  NA 134*   < > 133* 131*  133*  K 3.1*   < > 4.3 4.5 4.5  CL 95*   < > 99 98 100  CO2 27   < > 25 24 26   GLUCOSE 118*   < > 143* 194* 134*  BUN 12   < > 12 11 14   CREATININE 1.36*   < > 1.40* 1.62* 1.30*  CALCIUM 9.0   < > 9.0 9.4 9.1  PROT 6.1*  --   --  6.2* 5.6*  ALBUMIN 3.1*  --   --  3.2* 2.9*  AST 52*  --   --  59* 55*  ALT 80*  --   --  86* 81*  ALKPHOS 105  --   --  102 86  BILITOT 0.8  --   --  0.8 0.8  GFRNONAA 43*   < > 41* 35* 45*  GFRAA 50*   < > 48* 40* 52*  ANIONGAP 12   < > 9 9 7    < > = values in this interval not displayed.     Hematology Recent Labs  Lab 03/06/20 0441 03/07/20 1223 03/08/20 0412  WBC 7.4 5.9 6.6  RBC 2.90* 2.87* 2.76*  HGB 9.0* 8.9* 8.6*  HCT  28.8* 28.6* 27.5*  MCV 99.3 99.7 99.6  MCH 31.0 31.0 31.2  MCHC 31.3 31.1 31.3  RDW 14.1 14.1 14.3  PLT 320 324 283    BNPNo results for input(s): BNP, PROBNP in the last 168 hours.   DDimer No results for input(s): DDIMER in the last 168 hours.   Cardiac Studies   TEE 03/08/2020   1. EF now 45-50% compared with prior. Prior echo was in Afib with RVR  ~150 bpm.  2. Left ventricular ejection fraction, by estimation, is 45 to 50%. The  left ventricle has mildly decreased function. The left ventricle  demonstrates global hypokinesis. Left ventricular diastolic parameters are  consistent with Grade II diastolic  dysfunction (pseudonormalization). Elevated left atrial pressure.  3. Right ventricular systolic function is mildly reduced. The right  ventricular size is normal. Tricuspid regurgitation signal is inadequate  for assessing PA pressure.  4. The mitral valve is grossly normal. Trivial mitral valve  regurgitation. No evidence of mitral stenosis.  5. The aortic valve is grossly normal. Aortic valve regurgitation is not  visualized. No aortic stenosis is present.   Patient Profile     59 y.o. female with a hx of paroxysmal atrial fibrillation and flutter, chronic systolic CHF/NICM, mild nonobstructive  CAD by coronary CTA 04/2019, hypertensive heart disease, CKD stage III, iron deficiency anemia, DM, abnormal LFTs, HTN, HLD, hyperthyroidism s/p prior methimazole rx, prolonged QT interval, falls who is being seen today for the evaluation of antiarrhythmic management at the request of Dr. Thereasa Solo.  The patient has a very complicated history, to summarize she has been through tremendous emotional stress last year and it was followed by her admission to the hospital with atrial fibrillation with RVR requiring TEE with cardioversion LVEF 20% at that time with global hypokinesis, coronary CTA showed only mild nonobstructive CAD, she was also found to have elevated liver enzyme in acute renal failure.  She was started on amiodarone following that admission, per daughter her condition deteriorated with her inability to walk, weakness, peripheral wasting with increased abdominal girth, and facial swelling in a pattern of cushingoid syndrome.   Assessment & Plan    PAF - in Sinus bradycardia, on chronic high doe (incidental, she was taking 200 mg po bid instead of daily), now off, should not be ever started on amiodarone again, she will require months to wash out - remains in SR, we will follow as outpatient - continue eliquis 5 mg po bid  Nonischemic CMP - LVEF 20% in 07/2019, most probably sec to a-fib with RVR, now improved to 45-50% - continue metoprolol, consider adding losartan 25 mg po daily once her crea at baseline - she is euvolemic    Severe hypothyroidism - possible early myxedema TSH markedly elevated at 74 - stopped amiodarone - free T4 very low and repeat TSH remains markedly elevated  - managed by primary team  Adrenal insufficiency  Random serum cortisol quite low at 1.5 - ACTH stim test indicates brisk adrenal response to cosyntropin dosing - begin hydrocortisone replacement with IV stress dosing x24hrs followed by oral dosing -patient vehemently denies use of steroids chronically or  even acutely recently -studies appear suggestive of a tertiary or secondary adrenal insufficiency but there is no clear reason this will be the case -follow with establishment of hydrocortisone dosing -would benefit from outpatient endocrinology evaluation once stabilized   [facility] will sign off.   Medication Recommendations:  As above Other recommendations (labs, testing, etc):  No further  cardiac testing  Follow up as an outpatient:  We will arrange with Dr Bettina Gavia in 2-3 weeks  For questions or updates, please contact [facility] Please consult [email or web address] for contact info under        Signed, Ena Dawley, MD  03/10/2020, 9:19 AM

## 2020-03-11 ENCOUNTER — Inpatient Hospital Stay (HOSPITAL_COMMUNITY): Payer: Medicare HMO

## 2020-03-11 DIAGNOSIS — R7989 Other specified abnormal findings of blood chemistry: Secondary | ICD-10-CM | POA: Diagnosis not present

## 2020-03-11 DIAGNOSIS — N1832 Chronic kidney disease, stage 3b: Secondary | ICD-10-CM | POA: Diagnosis not present

## 2020-03-11 DIAGNOSIS — G9340 Encephalopathy, unspecified: Secondary | ICD-10-CM | POA: Diagnosis not present

## 2020-03-11 DIAGNOSIS — I10 Essential (primary) hypertension: Secondary | ICD-10-CM | POA: Diagnosis not present

## 2020-03-11 LAB — ANTINUCLEAR ANTIBODIES, IFA: ANA Ab, IFA: NEGATIVE

## 2020-03-11 LAB — RHEUMATOID FACTOR: Rheumatoid fact SerPl-aCnc: 11.4 IU/mL (ref 0.0–13.9)

## 2020-03-11 IMAGING — DX DG KNEE 1-2V*R*
2 series · 2 of 2 positions shown · non-contrast
Comparison: [DATE]

CLINICAL DATA: Right-sided knee pain

EXAM:
RIGHT KNEE - 1-2 VIEW

[knee ap]
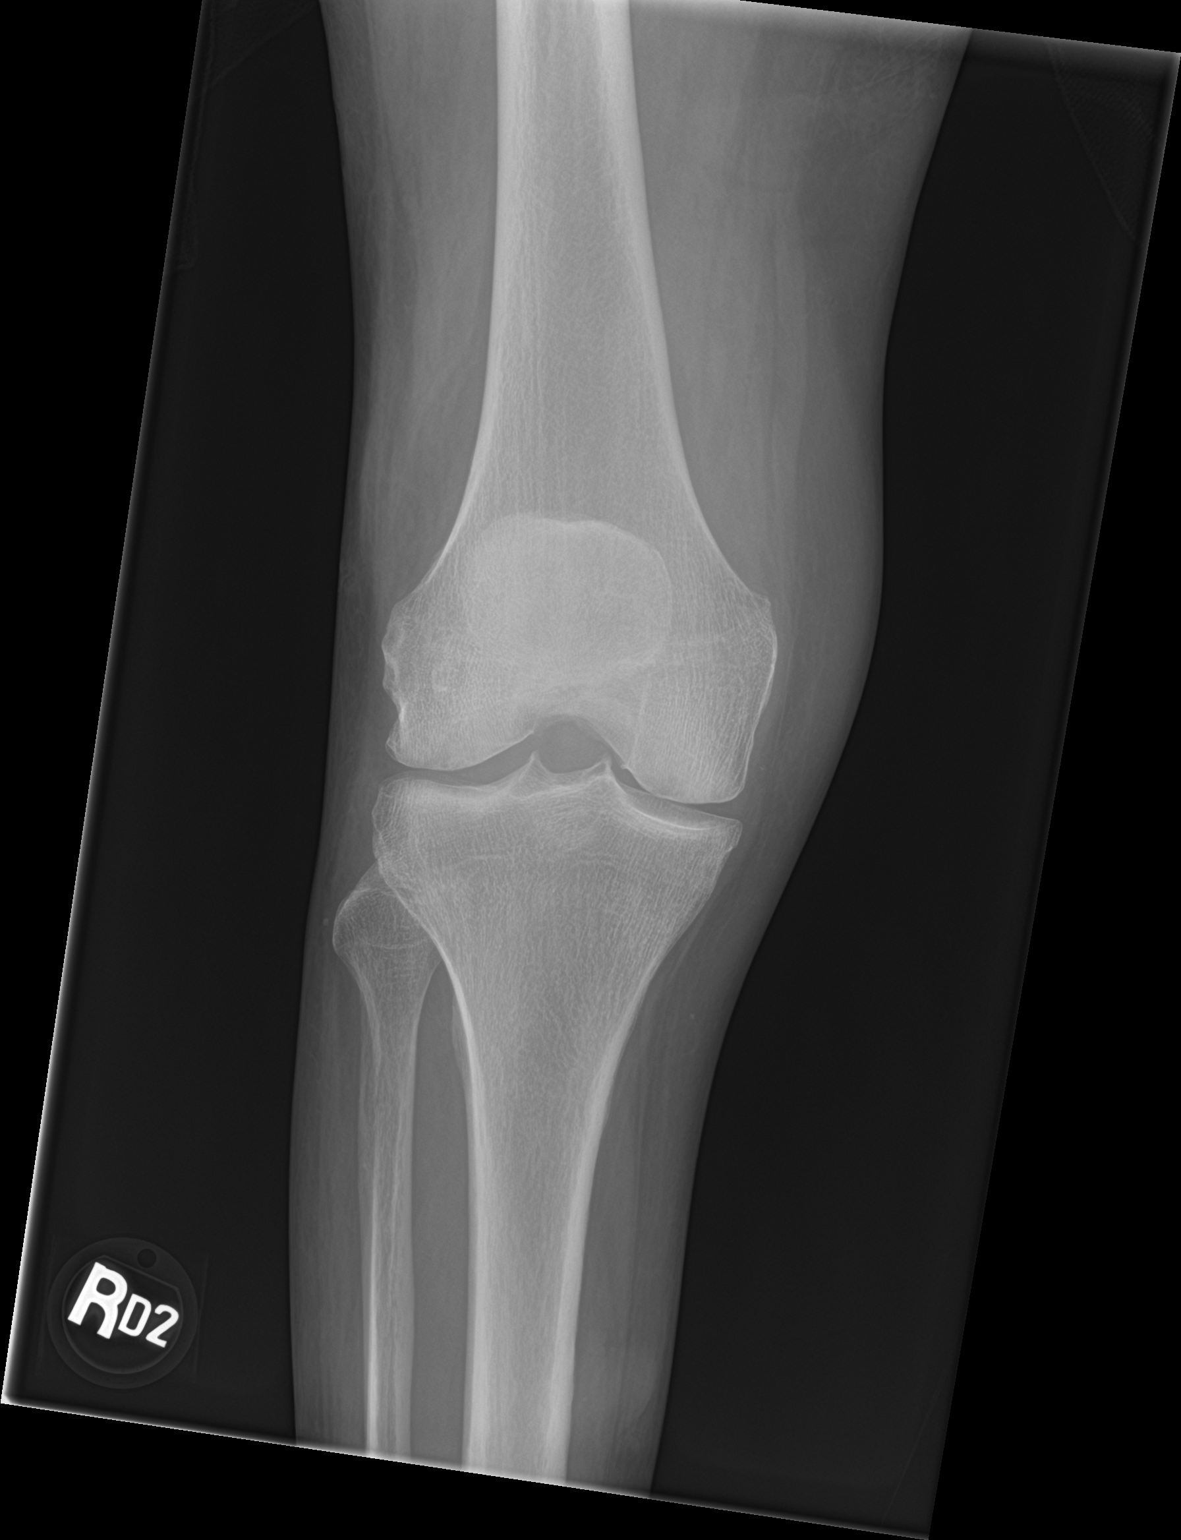

[knee lat]
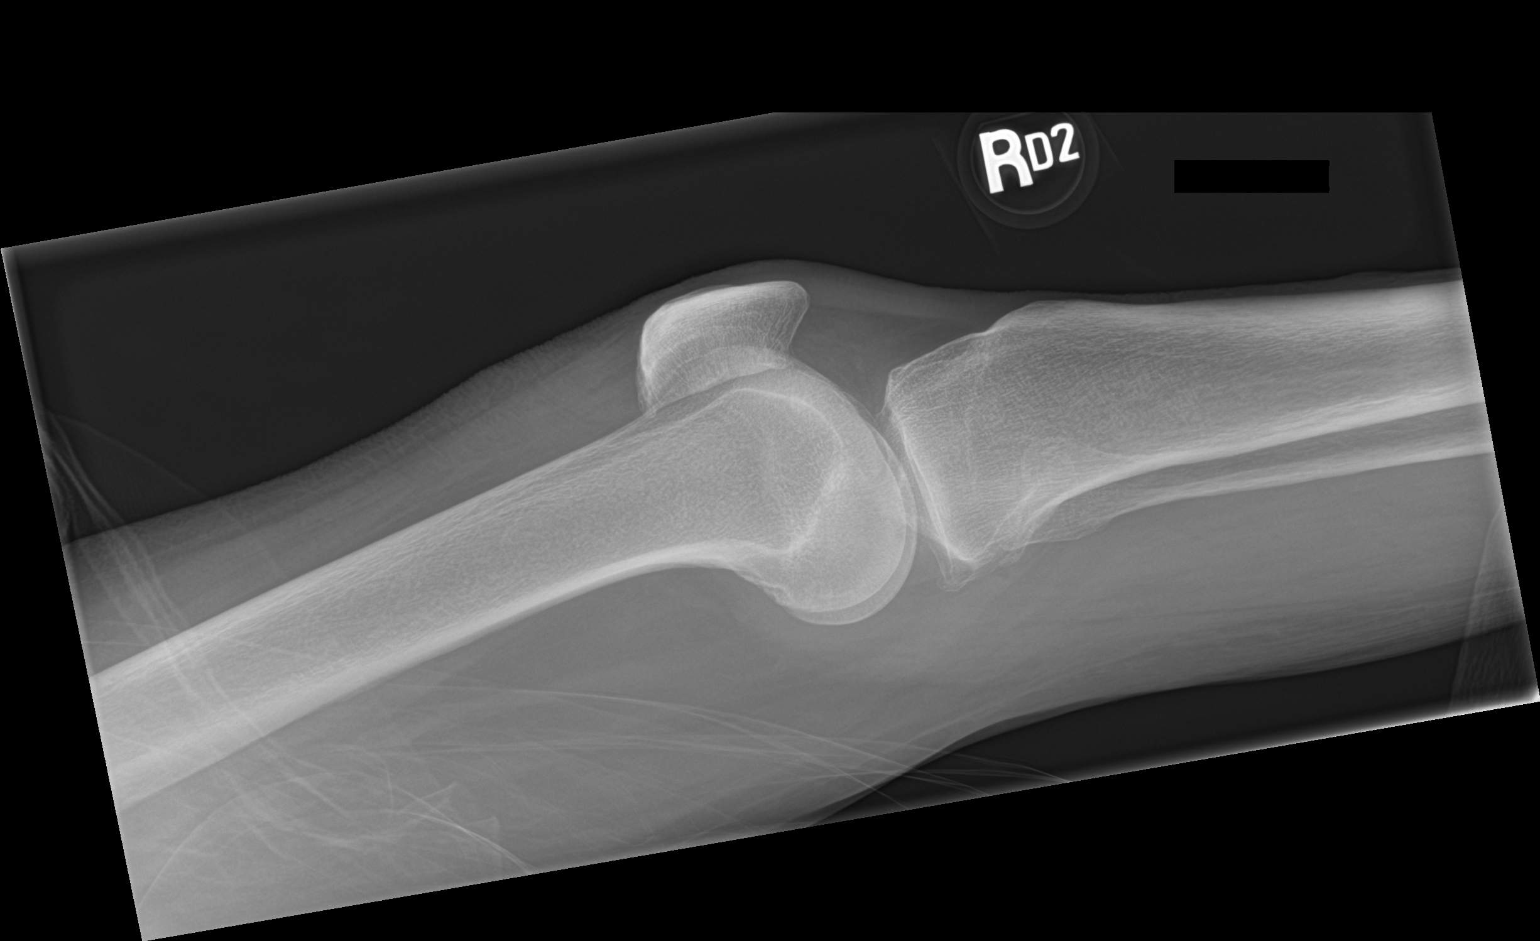

[2 of 2 positions shown; findings below may reference images not displayed]

FINDINGS: No fracture or malalignment. Mild lateral and patellofemoral
degenerative change. No large knee effusion.
IMPRESSION: Mild degenerative change.

## 2020-03-11 MED ORDER — APIXABAN 5 MG PO TABS
5.0000 mg | ORAL_TABLET | Freq: Two times a day (BID) | ORAL | Status: DC
  Administered 2020-03-14 – 2020-03-18 (×8): 5 mg via ORAL
  Filled 2020-03-11 (×8): qty 1

## 2020-03-11 MED ORDER — AMLODIPINE BESYLATE 10 MG PO TABS
10.0000 mg | ORAL_TABLET | Freq: Once | ORAL | Status: AC
  Administered 2020-03-12: 10 mg via ORAL
  Filled 2020-03-11: qty 1

## 2020-03-11 MED ORDER — FLUTICASONE PROPIONATE 50 MCG/ACT NA SUSP
1.0000 | Freq: Every day | NASAL | Status: DC
  Administered 2020-03-12 – 2020-03-18 (×6): 1 via NASAL
  Filled 2020-03-11: qty 16

## 2020-03-11 NOTE — Progress Notes (Signed)
Discharge on Monday to SNF after shoulder aspiration

## 2020-03-11 NOTE — Progress Notes (Addendum)
PROGRESS NOTE    Brianna Porter  YFV:[id number] DOB: May 29, 1961 DOA: 03/04/2020 PCP: Cher Nakai, MD    Chief Complaint  Patient presents with  . Weakness  . right leg numbness    Brief Narrative:  Brief Narrative:  59 year old with a history of atrial fibrillation on Eliquis, nonischemic cardiomyopathy, depression with anxiety, chronic anemia, and HTN who presented to the ED 7/17 with bilateral lower extremity paresthesias, right hand weakness, intermittent limb jerking, and convulsions.  She reportedly generalized seizure-like episode roughly 2 years ago, and a second similar episode 3 days ago after which she refused transport to the hospital.  She stated that her paresthesias have been present for 3 to 4 days and involve both feet and her right hand.  She felt that her right hand had been weak and lacking coordination for 2 days causing her to drop things.  She reports progressive worsening of generalized weakness such that she transition from using a cane to walk to using a walker, and is now fully dependent upon a wheelchair.  In the ED CT head was unrevealing for acute abnormalities.  MRI was negative for acute abnormalities.  Vital signs were otherwise stable.  Significant Events: 7/17 admit via [place] 7/17 EEG no evidence of seizure or epileptiform discharges  Antimicrobials:  None  Subjective: C/o sinus headache, right hand swollen, right knee pain, left shoulder pain She continues appear to be weak, but more awake and interactive compared to yesterday morning oriented x3 Report chronic left shoulder pain, not able to lift left arm above left shoulder  Assessment & Plan:   Principal Problem:   Encephalopathy Active Problems:   Type 2 diabetes mellitus with complication, without long-term current use of insulin (HCC)   Anemia   CKD (chronic kidney disease) stage 3, GFR 30-59 ml/min   Persistent atrial fibrillation (HCC)   Essential hypertension   CAD (coronary  artery disease)   NICM (nonischemic cardiomyopathy) (HCC)   Elevated liver function tests   Convulsions (HCC)  Severe hypothyroidism - possible early myxedema TSH markedly elevated at 74 - stopped amiodarone - free T4 very low and repeat TSH remains markedly elevated - no hypothermia, hypoglycemia, hypoventilation, or hypotension, but bradycardia and hyponatremia are present and tremors and parathesias are felt to be a neurologic manifestation - will tx as a developing myxedema in absence of coma - hold on T3 dosing given cardiac hx/difficult Afib  Continue Synthroid 100 mcg mg daily Will need to repeat TSH in 4 to 6 weeks  Adrenal insufficiency  Random serum cortisol quite low at 1.5 - ACTH stim test indicates brisk adrenal response to cosyntropin dosing - begin hydrocortisone replacement with IV stress dosing x24hrs followed by oral dosing -patient vehemently denies use of steroids chronically or even acutely recently -studies appear suggestive of a tertiary or secondary adrenal insufficiency but there is no clear reason this will be the case -follow with establishment of hydrocortisone dosing -would benefit from outpatient endocrinology evaluation once stabilized Continue Cortef 2 mg twice a day  Sign/Sx complex: -- Bilateral foot paresthesias of about 3 days' duration, worse on the right -- Right hand paresthesias of about 3 days' duration -- Right hand clumsiness and weakness of about 1 day's duration -- RLE weakness of about 1 day's duration -- Intermittent jerking of individual limbs for the past 2 years - generalized convulsions x2  MRI brain with no acute abnormality noted - EEG unrevealing - MRI of cervical thoracic and lumbar spine revealed only mild  lumbar spondylosis but no acute findings otherwise -folic acid level normal -B12 level normal -ammonia normal -UDS unrevealing - stopped neurontin as Neurology felt this was contributing - sx seem to be improving slowly - perhaps these  sx are also a manifestation of her mutliple endocrine derangements   Atrial fibrillation - chronic CHA2DS2-VASc 3 - resumed Eliquis - stopped amiodarone due to qtc prolongation (cardiology recommended should not ever restart on amiodarone again please see cardiology note from 7/19 and 7/22)-   Continue Toprol XL 100 mg daily, currently sinus rhythm with intermittent sinus bradycardia and first-degree AV block -Cardiology has signed off on 7/22, recommend follow-up with Dr. Bettina Gavia in 2 to 3 weeks.  QTC prolongation QTC on June 16 was 550 Repeat EKG QTC normalized to 437 on 7/23  Nonischemic systolic congestive heart failure EF 20-25% September 2020 -repeat echo with improved left ventricular EF to 45 to 50%  euvolemic at present  Cardiology consider ending losartan if renal function allows, will follow cardiology recommendation  Elevated transaminases Hepatitis panel negative  liver US "Changes of fatty infiltration of the liver. No other focal abnormality is noted." Could be due to amiodarone as well, as currently patient does not appear to have acute heart failure less likely from liver congestion.  Hypokalemia Corrected with supplementation  HTN Blood pressure in reasonable range at present -follow trend  CKD stage IIIb/anemia of chronic disease Baseline creatinine 1.36 - back to baseline w/ gentle hydration    Depression/anxiety Mood is stable at the present time   Left shoulder pain -subacute Patient tells me she was due to see an orthopedist in [place] for this pain in the outpatient setting -plain film XR 7/6 did not reveal an acute finding  MRI left shoulder obtained due to persistent symptoms, case discussed with orthopedic surgeon on-call Dr. Erlinda Hong who reviewed MRI and advised that  MRI is consistent with chronic rotator cuff arthropathy  She has been on oral hydrocortisone (indicated for other reasons noted above), but symptom persists, may benefit from shoulder  injection will discuss with patient.  Right knee deformity: Right leg limited range of motion, right leg give out for a month Right knee x ray ordered.  Numerous skin wounds/blisters /ecchymosis  pressure Injury POA: N/A Measurement: too numerous to measure each, the largest seen was 0.5cm x 0.8cm x 0.1cm Wound bed: bleeding to red, dry Drainage (amount, consistency, odor) serosanguinous to serous Periwound: ecchymotic Dressing procedure/placement/frequency: These lesions respond to daily application of xeroform gauze, patient reports that they have recently run out of xeroform and have been using dry gauze in its place, which becomes adherent and causes more trauma upon removal.  I have provided Nursing with guidance in the care of these lesions using saline to cleanse, then xeroform antimicrobial nonadherent gauze secured with Kerlix roll gauze except to face where dressing can be secured with a silicone foam.  Failure to thrive need SNF placement    DVT prophylaxis:  apixaban (ELIQUIS) tablet 5 mg   Code Status:full Family Communication: Daughter over phone with patient permission Disposition:   Status is: Inpatient  Dispo:  Patient From: Home  Planned Disposition: [place]  Expected discharge date: 03/11/20  Medically stable for discharge: Hold Eliquis for 4 doses, then shoulder injection on Monday morning, resume Eliquis after shoulder injection and hopefully can discharge Monday afternoon to SNF   Consultants:   Neurology on July 17  Cardiology on July 19  Wound care  Orthopedic  Radiology  Procedures:  Plan for left shoulder aspiration and steroid injection on Monday  Antimicrobials:   None     Objective: Vitals:   03/10/20 2040 03/10/20 2353 03/11/20 0415 03/11/20 0830  BP: (!) 162/80 (!) 157/59 (!) 173/69 (!) 183/85  Pulse: 65 63 66 67  Resp: 12 19 14 16   Temp: 98.6 F (37 C) 99.5 F (37.5 C) 98.2 F (36.8 C) 98.2 F (36.8  C)  TempSrc: Oral Oral Oral Oral  SpO2: 100% 100% 98% 98%  Weight:      Height:        Intake/Output Summary (Last 24 hours) at 03/11/2020 1011 Last data filed at 03/11/2020 0500 Gross per 24 hour  Intake 150 ml  Output 550 ml  Net -400 ml   Filed Weights   03/06/20 0334 03/07/20 0500 03/08/20 0500  Weight: 65.9 kg 70.4 kg 72.3 kg    Examination:  General exam: Chronically ill-appearing, weak, alert, oriented x3 Respiratory system: Diminished at bases, no wheezing, no rales, no rhonchi, respiratory effort normal. Cardiovascular system: S1 & S2 heard, RRR. No JVD, no murmur, No pedal edema. Gastrointestinal system: Abdomen is nondistended, soft and nontender. No organomegaly or masses felt. Normal bowel sounds heard. Central nervous system:  orientedx3. No focal neurological deficits. Extremities: Generalized weakness, limited range of motion left shoulder due to pain, right knee deformity Skin: Scattered ecchymosis, blisters Psychiatry: Pleasant, calm and cooperative    Data Reviewed: I have personally reviewed following labs and imaging studies  CBC: Recent Labs  Lab 03/04/20 2247 03/06/20 0441 03/07/20 1223 03/08/20 0412  WBC 8.4 7.4 5.9 6.6  HGB 9.6* 9.0* 8.9* 8.6*  HCT 31.0* 28.8* 28.6* 27.5*  MCV 99.0 99.3 99.7 99.6  PLT 315 320 324 831    Basic Metabolic Panel: Recent Labs  Lab 03/06/20 0441 03/06/20 1109 03/07/20 1223 03/08/20 0412 03/09/20 0423 03/10/20 0541  NA 131*  --  134* 133* 131* 133*  K 3.3*  --  3.1* 4.3 4.5 4.5  CL 94*  --  95* 99 98 100  CO2 26  --  27 25 24 26   GLUCOSE 125*  --  118* 143* 194* 134*  BUN 14  --  12 12 11 14   CREATININE 1.52*  --  1.36* 1.40* 1.62* 1.30*  CALCIUM 9.5  --  9.0 9.0 9.4 9.1  MG  --  1.9  --   --  2.0  --   PHOS  --   --   --   --  2.7  --     GFR: Estimated Creatinine Clearance: 43.9 mL/min (A) (by C-G formula based on SCr of 1.3 mg/dL (H)).  Liver Function Tests: Recent Labs  Lab 03/04/20 2247  03/06/20 0441 03/07/20 1223 03/09/20 0423 03/10/20 0541  AST 76* 60* 52* 59* 55*  ALT 120* 94* 80* 86* 81*  ALKPHOS 126 108 105 102 86  BILITOT 0.7 0.4 0.8 0.8 0.8  PROT 6.6 6.1* 6.1* 6.2* 5.6*  ALBUMIN 3.7 3.2* 3.1* 3.2* 2.9*    CBG: No results for input(s): GLUCAP in the last 168 hours.   Recent Results (from the past 240 hour(s))  Urine culture     Status: Abnormal   Collection Time: 03/04/20 10:56 PM   Specimen: Urine, Clean Catch  Result Value Ref Range Status   Specimen Description URINE, CLEAN CATCH  Final   Special Requests   Final    NONE Performed at [facility], [address] [place], [place] [address]  Culture MULTIPLE SPECIES PRESENT, SUGGEST RECOLLECTION (A)  Final   Report Status 03/06/2020 FINAL  Final  SARS Coronavirus 2 by RT PCR (hospital order, performed in [facility] hospital lab) Nasopharyngeal Nasopharyngeal Swab     Status: None   Collection Time: 03/05/20  6:26 AM   Specimen: Nasopharyngeal Swab  Result Value Ref Range Status   SARS Coronavirus 2 NEGATIVE NEGATIVE Final    Comment: (NOTE) SARS-CoV-2 target nucleic acids are NOT DETECTED.  The SARS-CoV-2 RNA is generally detectable in upper and lower respiratory specimens during the acute phase of infection. The lowest concentration of SARS-CoV-2 viral copies this assay can detect is 250 copies / mL. A negative result does not preclude SARS-CoV-2 infection and should not be used as the sole basis for treatment or other patient management decisions.  A negative result may occur with improper specimen collection / handling, submission of specimen other than nasopharyngeal swab, presence of viral mutation(s) within the areas targeted by this assay, and inadequate number of viral copies (<250 copies / mL). A negative result must be combined with clinical observations, patient history, and epidemiological information.  Fact Sheet for Patients:    StrictlyIdeas.no  Fact Sheet for Healthcare Providers: BankingDealers.co.za  This test is not yet approved or  cleared by the [place] FDA and has been authorized for detection and/or diagnosis of SARS-CoV-2 by FDA under an Emergency Use Authorization (EUA).  This EUA will remain in effect (meaning this test can be used) for the duration of the COVID-19 declaration under Section 564(b)(1) of the Act, 21 U.S.C. section 360bbb-3(b)(1), unless the authorization is terminated or revoked sooner.  Performed at [facility], [place] [address], [place], [place] [address]   Culture, blood (routine x 2)     Status: None (Preliminary result)   Collection Time: 03/10/20 10:47 AM   Specimen: BLOOD LEFT HAND  Result Value Ref Range Status   Specimen Description BLOOD LEFT HAND  Final   Special Requests   Final    BOTTLES DRAWN AEROBIC ONLY Blood Culture results may not be optimal due to an inadequate volume of blood received in culture bottles   Culture   Final    NO GROWTH < 24 HOURS Performed at [facility], [place] [address] [place], [place] [address]    Report Status PENDING  Incomplete  Culture, blood (routine x 2)     Status: None (Preliminary result)   Collection Time: 03/10/20 10:50 AM   Specimen: BLOOD RIGHT HAND  Result Value Ref Range Status   Specimen Description BLOOD RIGHT HAND  Final   Special Requests   Final    BOTTLES DRAWN AEROBIC ONLY Blood Culture adequate volume   Culture   Final    NO GROWTH < 24 HOURS Performed at [facility], [place] [address], [place], [place] [address]    Report Status PENDING  Incomplete         Radiology Studies: MR SHOULDER LEFT WO CONTRAST  Result Date: 03/10/2020 CLINICAL DATA:  Shoulder pain EXAM: MRI OF THE LEFT SHOULDER WITHOUT CONTRAST TECHNIQUE: Multiplanar, multisequence MR imaging of the shoulder was performed. No intravenous contrast was administered.  COMPARISON:  X-ray 02/23/2020 FINDINGS: Technical note: Motion degraded examination Rotator cuff: Complete full-thickness tear of the supraspinatus tendon retracted to the level of the glenohumeral joint. Tear extends to involve the interdigitating and anterior insertional fibers of the infraspinatus tendon. The mid to posterior portions of the infraspinatus tendon are intact but  appear attenuated. Complete full-thickness retracted tear of the subscapularis tendon. Intact teres minor. Muscles: Atrophy of the supraspinatus and subscapularis muscles. Rotator cuff intramuscular edema most pronounced at the supraspinatus and subscapularis. Biceps long head: Torn and retracted to the level of the proximal humeral metaphysis. Acromioclavicular Joint: Mild arthropathy of the acromioclavicular joint. Moderate subacromial-subdeltoid bursitis. Glenohumeral Joint: Extensive high-grade cartilage loss of the humeral head and glenoid. Moderate joint effusion with extensive synovitis. Labrum:  Circumferentially degenerated and torn. Bones: Large erosion of the posterosuperior aspect of the humeral head (series 3, images 7-8; series 8, images 8-11). Humeral head is high-riding relative to the glenoid without dislocation. No acute fracture. No suspicious marrow replacing lesion. Other: Prominent periarticular soft tissue edema. IMPRESSION: 1. Left shoulder joint effusion with extensive glenohumeral joint synovitis and subacromial-subdeltoid bursitis with large erosion at the posterosuperior humeral head. Differential includes sequela of an inflammatory or crystalline arthropathy. Infection with septic arthritis and humeral head osteomyelitis is not excluded and arthrocentesis with joint fluid analysis is recommended. 2. Chronic complete full-thickness tear of the supraspinatus tendon retracted to the level of the glenohumeral joint. Tear extends to involve the interdigitating and anterior insertional fibers of the infraspinatus  tendon. The mid to posterior portions of the infraspinatus tendon are intact but appear attenuated. 3. Chronic full-thickness retracted tear of the subscapularis tendon. 4. Long head biceps tendon is torn and retracted. These results will be called to the ordering clinician or representative by the Radiologist Assistant, and communication documented in the PACS or [employer]. Electronically Signed   By: Davina Poke D.O.   On: 03/10/2020 09:51   US Abdomen Limited  Result Date: 03/10/2020 CLINICAL DATA:  Elevated LFTs EXAM: ULTRASOUND ABDOMEN LIMITED RIGHT UPPER QUADRANT COMPARISON:  None. FINDINGS: Gallbladder: No gallstones or wall thickening visualized. No sonographic Murphy sign noted by sonographer. Common bile duct: Diameter: 3.9 mm. Liver: Mild increased echogenicity is noted consistent with fatty infiltration. No focal mass is seen. No significant nodularity is noted. Portal vein is patent on color Doppler imaging with normal direction of blood flow towards the liver. Other: None. IMPRESSION: Changes of fatty infiltration of the liver. No other focal abnormality is noted. Electronically Signed   By: Inez Catalina M.D.   On: 03/10/2020 08:30        Scheduled Meds: . apixaban  5 mg Oral BID  . DULoxetine  60 mg Oral BID  . hydrocortisone  5 mg Oral BID  . levothyroxine  100 mcg Oral Q0600  . metoprolol succinate  100 mg Oral Daily  . nicotine  21 mg Transdermal Daily  . pantoprazole  40 mg Oral BID  . sodium chloride flush  3 mL Intravenous Q12H   Continuous Infusions:   LOS: 5 days     Time spent: 8mins I have personally reviewed and interpreted on  03/11/2020 daily labs, tele strips, imagings as discussed above under date review session and assessment and plans.  I reviewed all nursing notes, pharmacy notes, consultant notes,  vitals, pertinent old records  I have discussed plan of care as described above with RN , patient  on 03/11/2020  Voice Recognition /Dragon  dictation system was used to create this note, attempts have been made to correct errors. Please contact the author with questions and/or clarifications.   Florencia Reasons, MD PhD FACP [facility]  Available via Epic secure chat 7am-7pm for nonurgent issues Please page for urgent issues To page the attending provider between 7A-7P or the covering provider during  after hours 7P-7A, please log into the web site [email or web address] and access using universal [place] password for that web site. If you do not have the password, please call the hospital operator.    03/11/2020, 10:11 AM

## 2020-03-12 DIAGNOSIS — G9340 Encephalopathy, unspecified: Secondary | ICD-10-CM | POA: Diagnosis not present

## 2020-03-12 MED ORDER — SENNOSIDES-DOCUSATE SODIUM 8.6-50 MG PO TABS
1.0000 | ORAL_TABLET | Freq: Two times a day (BID) | ORAL | Status: DC
  Administered 2020-03-12 – 2020-03-18 (×12): 1 via ORAL
  Filled 2020-03-12 (×12): qty 1

## 2020-03-12 NOTE — Progress Notes (Signed)
PROGRESS NOTE    Brianna Porter  EHM:[id number] DOB: [birth year] DOA: 03/04/2020 PCP: Cher Nakai, MD    Chief Complaint  Patient presents with   Weakness   right leg numbness    Brief Narrative:  Brief Narrative:  59 year old with a history of atrial fibrillation on Eliquis, nonischemic cardiomyopathy, depression with anxiety, chronic anemia, and HTN who presented to the ED 7/17 with bilateral lower extremity paresthesias, right hand weakness, intermittent limb jerking, and convulsions.  She reportedly generalized seizure-like episode roughly 2 years ago, and a second similar episode 3 days ago after which she refused transport to the hospital.  She stated that her paresthesias have been present for 3 to 4 days and involve both feet and her right hand.  She felt that her right hand had been weak and lacking coordination for 2 days causing her to drop things.  She reports progressive worsening of generalized weakness such that she transition from using a cane to walk to using a walker, and is now fully dependent upon a wheelchair.  In the ED CT head was unrevealing for acute abnormalities.  MRI was negative for acute abnormalities.  Vital signs were otherwise stable.  Significant Events: 7/17 admit via [place] 7/17 EEG no evidence of seizure or epileptiform discharges  Antimicrobials:  None  Subjective:  She is very weak, but more awake and interactive, oriented x3 Report chronic left shoulder pain, not able to lift left arm above left shoulder, denies headache, denies knee pain She wants to try got out of bed and sit in chair today  Assessment & Plan:   Principal Problem:   Encephalopathy Active Problems:   Type 2 diabetes mellitus with complication, without long-term current use of insulin (HCC)   Anemia   CKD (chronic kidney disease) stage 3, GFR 30-59 ml/min   Persistent atrial fibrillation (HCC)   Essential hypertension   CAD (coronary artery disease)   NICM  (nonischemic cardiomyopathy) (HCC)   Elevated liver function tests   Convulsions (HCC)  Severe hypothyroidism - possible early myxedema TSH markedly elevated at 74 - stopped amiodarone - free T4 very low and repeat TSH remains markedly elevated - no hypothermia, hypoglycemia, hypoventilation, or hypotension, but bradycardia and hyponatremia are present and tremors and parathesias are felt to be a neurologic manifestation - will tx as a developing myxedema in absence of coma - hold on T3 dosing given cardiac hx/difficult Afib  Continue Synthroid 100 mcg mg daily Will need to repeat TSH in 4 to 6 weeks  Adrenal insufficiency  Random serum cortisol quite low at 1.5 - ACTH stim test indicates brisk adrenal response to cosyntropin dosing - begin hydrocortisone replacement with IV stress dosing x24hrs followed by oral dosing -patient vehemently denies use of steroids chronically or even acutely recently -studies appear suggestive of a tertiary or secondary adrenal insufficiency but there is no clear reason this will be the case -follow with establishment of hydrocortisone dosing -would benefit from outpatient endocrinology evaluation once stabilized Continue Cortef 2 mg twice a day  Sign/Sx complex: -- Bilateral foot paresthesias of about 3 days' duration, worse on the right -- Right hand paresthesias of about 3 days' duration -- Right hand clumsiness and weakness of about 1 day's duration -- RLE weakness of about 1 day's duration -- Intermittent jerking of individual limbs for the past 2 years - generalized convulsions x2  MRI brain with no acute abnormality noted - EEG unrevealing - MRI of cervical thoracic and lumbar spine revealed only  mild lumbar spondylosis but no acute findings otherwise -folic acid level normal -B12 level normal -ammonia normal -UDS unrevealing - stopped neurontin as Neurology felt this was contributing - sx seem to be improving slowly - perhaps these sx are also a  manifestation of her mutliple endocrine derangements   Atrial fibrillation - chronic CHA2DS2-VASc 3 - resumed Eliquis - stopped amiodarone due to qtc prolongation (cardiology recommended should not ever restart on amiodarone again please see cardiology note from 7/19 and 7/22)-   Continue Toprol XL 100 mg daily, currently sinus rhythm with intermittent sinus bradycardia and first-degree AV block -Cardiology has signed off on 7/22, recommend follow-up with Dr. Bettina Gavia in 2 to 3 weeks.  QTC prolongation QTC on June 16 was 550 Repeat EKG QTC normalized to 437 on 7/23  Nonischemic systolic congestive heart failure EF 20-25% September 2020 -repeat echo with improved left ventricular EF to 45 to 50%  euvolemic at present  Cardiology consider ending losartan if renal function allows, will follow cardiology recommendation  Elevated transaminases Hepatitis panel negative  liver US "Changes of fatty infiltration of the liver. No other focal abnormality is noted." Could be due to amiodarone as well, as currently patient does not appear to have acute heart failure less likely from liver congestion.  Hypokalemia Corrected with supplementation  HTN Blood pressure in reasonable range at present -follow trend  CKD stage IIIb/anemia of chronic disease Baseline creatinine 1.36 - back to baseline w/ gentle hydration    Depression/anxiety Mood is stable at the present time   Left shoulder pain -subacute Patient tells me she was due to see an orthopedist in [place] for this pain in the outpatient setting -plain film XR 7/6 did not reveal an acute finding  MRI left shoulder obtained due to persistent symptoms, case discussed with orthopedic surgeon on-call Dr. Erlinda Hong who reviewed MRI and advised that  MRI is consistent with chronic rotator cuff arthropathy  She has been on oral hydrocortisone (indicated for other reasons noted above), but symptom persists, may benefit from shoulder injection will  discuss with patient.  Right knee deformity: Right leg limited range of motion, right leg give out for a month Right knee x ray "No fracture or malalignment. Mild lateral and patellofemoral degenerative change. No large knee effusion.  IMPRESSION: Mild degenerative change." Outpatient follow-up with orthopedics, consider outpatient knee mri.  Numerous skin wounds/blisters /ecchymosis  pressure Injury POA: N/A Measurement: too numerous to measure each, the largest seen was 0.5cm x 0.8cm x 0.1cm Wound bed: bleeding to red, dry Drainage (amount, consistency, odor) serosanguinous to serous Periwound: ecchymotic Dressing procedure/placement/frequency: These lesions respond to daily application of xeroform gauze, patient reports that they have recently run out of xeroform and have been using dry gauze in its place, which becomes adherent and causes more trauma upon removal.  I have provided Nursing with guidance in the care of these lesions using saline to cleanse, then xeroform antimicrobial nonadherent gauze secured with Kerlix roll gauze except to face where dressing can be secured with a silicone foam.  Failure to thrive need SNF placement    DVT prophylaxis:  apixaban (ELIQUIS) tablet 5 mg  apixaban (ELIQUIS) tablet 5 mg   Code Status:full Family Communication: Daughter over phone with patient permission on 7/23 Disposition:   Status is: Inpatient  Dispo:  Patient From: Home  Planned Disposition: [place]  Expected discharge date: 03/14/20  Medically stable for discharge: Hold Eliquis for 4 doses, then shoulder injection on Monday morning, resume  Eliquis after shoulder injection and hopefully can discharge Monday afternoon to SNF   Consultants:   Neurology on July 17  Cardiology on July 19  Wound care  Orthopedic  Radiology  Procedures:  Plan for left shoulder aspiration and steroid injection on Monday  Antimicrobials:    None     Objective: Vitals:   03/11/20 2038 03/11/20 2337 03/12/20 0344 03/12/20 0400  BP:  (!) 163/70 (!) 160/77   Pulse:  64 67   Resp:  13 18   Temp:  98.9 F (37.2 C) (!) 97.5 F (36.4 C)   TempSrc:  Oral Oral   SpO2: 97% 99% 100%   Weight:    (!) 102.5 kg  Height:       No intake or output data in the 24 hours ending 03/12/20 0820 Filed Weights   03/07/20 0500 03/08/20 0500 03/12/20 0400  Weight: 70.4 kg 72.3 kg (!) 102.5 kg    Examination:  General exam: Chronically ill-appearing, weak, alert, oriented x3 Respiratory system: Diminished at bases, no wheezing, no rales, no rhonchi, respiratory effort normal. Cardiovascular system: S1 & S2 heard, RRR. No JVD, no murmur, No pedal edema. Gastrointestinal system: Abdomen is nondistended, soft and nontender. No organomegaly or masses felt. Normal bowel sounds heard. Central nervous system:  orientedx3. No focal neurological deficits. Extremities: Generalized weakness, limited range of motion left shoulder due to pain, right knee deformity Skin: Scattered ecchymosis, blisters Psychiatry: Pleasant, calm and cooperative    Data Reviewed: I have personally reviewed following labs and imaging studies  CBC: Recent Labs  Lab 03/06/20 0441 03/07/20 1223 03/08/20 0412  WBC 7.4 5.9 6.6  HGB 9.0* 8.9* 8.6*  HCT 28.8* 28.6* 27.5*  MCV 99.3 99.7 99.6  PLT 320 324 161    Basic Metabolic Panel: Recent Labs  Lab 03/06/20 0441 03/06/20 1109 03/07/20 1223 03/08/20 0412 03/09/20 0423 03/10/20 0541  NA 131*  --  134* 133* 131* 133*  K 3.3*  --  3.1* 4.3 4.5 4.5  CL 94*  --  95* 99 98 100  CO2 26  --  27 25 24 26   GLUCOSE 125*  --  118* 143* 194* 134*  BUN 14  --  12 12 11 14   CREATININE 1.52*  --  1.36* 1.40* 1.62* 1.30*  CALCIUM 9.5  --  9.0 9.0 9.4 9.1  MG  --  1.9  --   --  2.0  --   PHOS  --   --   --   --  2.7  --     GFR: Estimated Creatinine Clearance: 52.9 mL/min (A) (by C-G formula based on SCr of 1.3  mg/dL (H)).  Liver Function Tests: Recent Labs  Lab 03/06/20 0441 03/07/20 1223 03/09/20 0423 03/10/20 0541  AST 60* 52* 59* 55*  ALT 94* 80* 86* 81*  ALKPHOS 108 105 102 86  BILITOT 0.4 0.8 0.8 0.8  PROT 6.1* 6.1* 6.2* 5.6*  ALBUMIN 3.2* 3.1* 3.2* 2.9*    CBG: No results for input(s): GLUCAP in the last 168 hours.   Recent Results (from the past 240 hour(s))  Urine culture     Status: Abnormal   Collection Time: 03/04/20 10:56 PM   Specimen: Urine, Clean Catch  Result Value Ref Range Status   Specimen Description URINE, CLEAN CATCH  Final   Special Requests   Final    NONE Performed at [facility], [address], [place],  [address]    Culture MULTIPLE  SPECIES PRESENT, SUGGEST RECOLLECTION (A)  Final   Report Status 03/06/2020 FINAL  Final  SARS Coronavirus 2 by RT PCR (hospital order, performed in [facility] hospital lab) Nasopharyngeal Nasopharyngeal Swab     Status: None   Collection Time: 03/05/20  6:26 AM   Specimen: Nasopharyngeal Swab  Result Value Ref Range Status   SARS Coronavirus 2 NEGATIVE NEGATIVE Final    Comment: (NOTE) SARS-CoV-2 target nucleic acids are NOT DETECTED.  The SARS-CoV-2 RNA is generally detectable in upper and lower respiratory specimens during the acute phase of infection. The lowest concentration of SARS-CoV-2 viral copies this assay can detect is 250 copies / mL. A negative result does not preclude SARS-CoV-2 infection and should not be used as the sole basis for treatment or other patient management decisions.  A negative result may occur with improper specimen collection / handling, submission of specimen other than nasopharyngeal swab, presence of viral mutation(s) within the areas targeted by this assay, and inadequate number of viral copies (<250 copies / mL). A negative result must be combined with clinical observations, patient history, and epidemiological information.  Fact Sheet for Patients:    StrictlyIdeas.no  Fact Sheet for Healthcare Providers: BankingDealers.co.za  This test is not yet approved or  cleared by the [place] FDA and has been authorized for detection and/or diagnosis of SARS-CoV-2 by FDA under an Emergency Use Authorization (EUA).  This EUA will remain in effect (meaning this test can be used) for the duration of the COVID-19 declaration under Section 564(b)(1) of the Act, 21 U.S.C. section 360bbb-3(b)(1), unless the authorization is terminated or revoked sooner.  Performed at [facility], [place] [address], [place], [place] [address]   Culture, blood (routine x 2)     Status: None (Preliminary result)   Collection Time: 03/10/20 10:47 AM   Specimen: BLOOD LEFT HAND  Result Value Ref Range Status   Specimen Description BLOOD LEFT HAND  Final   Special Requests   Final    BOTTLES DRAWN AEROBIC ONLY Blood Culture results may not be optimal due to an inadequate volume of blood received in culture bottles   Culture   Final    NO GROWTH 2 DAYS Performed at [facility], [place] [address] [place], [place] [address]    Report Status PENDING  Incomplete  Culture, blood (routine x 2)     Status: None (Preliminary result)   Collection Time: 03/10/20 10:50 AM   Specimen: BLOOD RIGHT HAND  Result Value Ref Range Status   Specimen Description BLOOD RIGHT HAND  Final   Special Requests   Final    BOTTLES DRAWN AEROBIC ONLY Blood Culture adequate volume   Culture   Final    NO GROWTH 2 DAYS Performed at [facility], [place] [address] [place], [place] [address]    Report Status PENDING  Incomplete         Radiology Studies: DG Knee 1-2 Views Right  Result Date: 03/11/2020 CLINICAL DATA:  Right-sided knee pain EXAM: RIGHT KNEE - 1-2 VIEW COMPARISON:  01/19/2019 FINDINGS: No fracture or malalignment. Mild lateral and patellofemoral degenerative change. No large knee effusion. IMPRESSION: Mild  degenerative change. Electronically Signed   By: Donavan Foil M.D.   On: 03/11/2020 17:02   MR SHOULDER LEFT WO CONTRAST  Result Date: 03/10/2020 CLINICAL DATA:  Shoulder pain EXAM: MRI OF THE LEFT SHOULDER WITHOUT CONTRAST TECHNIQUE: Multiplanar, multisequence MR imaging of the shoulder was performed. No intravenous contrast  was administered. COMPARISON:  X-ray 02/23/2020 FINDINGS: Technical note: Motion degraded examination Rotator cuff: Complete full-thickness tear of the supraspinatus tendon retracted to the level of the glenohumeral joint. Tear extends to involve the interdigitating and anterior insertional fibers of the infraspinatus tendon. The mid to posterior portions of the infraspinatus tendon are intact but appear attenuated. Complete full-thickness retracted tear of the subscapularis tendon. Intact teres minor. Muscles: Atrophy of the supraspinatus and subscapularis muscles. Rotator cuff intramuscular edema most pronounced at the supraspinatus and subscapularis. Biceps long head: Torn and retracted to the level of the proximal humeral metaphysis. Acromioclavicular Joint: Mild arthropathy of the acromioclavicular joint. Moderate subacromial-subdeltoid bursitis. Glenohumeral Joint: Extensive high-grade cartilage loss of the humeral head and glenoid. Moderate joint effusion with extensive synovitis. Labrum:  Circumferentially degenerated and torn. Bones: Large erosion of the posterosuperior aspect of the humeral head (series 3, images 7-8; series 8, images 8-11). Humeral head is high-riding relative to the glenoid without dislocation. No acute fracture. No suspicious marrow replacing lesion. Other: Prominent periarticular soft tissue edema. IMPRESSION: 1. Left shoulder joint effusion with extensive glenohumeral joint synovitis and subacromial-subdeltoid bursitis with large erosion at the posterosuperior humeral head. Differential includes sequela of an inflammatory or crystalline arthropathy.  Infection with septic arthritis and humeral head osteomyelitis is not excluded and arthrocentesis with joint fluid analysis is recommended. 2. Chronic complete full-thickness tear of the supraspinatus tendon retracted to the level of the glenohumeral joint. Tear extends to involve the interdigitating and anterior insertional fibers of the infraspinatus tendon. The mid to posterior portions of the infraspinatus tendon are intact but appear attenuated. 3. Chronic full-thickness retracted tear of the subscapularis tendon. 4. Long head biceps tendon is torn and retracted. These results will be called to the ordering clinician or representative by the Radiologist Assistant, and communication documented in the PACS or [employer]. Electronically Signed   By: Davina Poke D.O.   On: 03/10/2020 09:51        Scheduled Meds:  apixaban  5 mg Oral BID   [START ON 03/14/2020] apixaban  5 mg Oral BID   DULoxetine  60 mg Oral BID   fluticasone  1 spray Each Nare Daily   hydrocortisone  5 mg Oral BID   levothyroxine  100 mcg Oral Q0600   metoprolol succinate  100 mg Oral Daily   nicotine  21 mg Transdermal Daily   pantoprazole  40 mg Oral BID   senna-docusate  1 tablet Oral BID   sodium chloride flush  3 mL Intravenous Q12H   Continuous Infusions:   LOS: 6 days     Time spent: 80mins I have personally reviewed and interpreted on  03/12/2020 daily labs, tele strips, imagings as discussed above under date review session and assessment and plans.  I reviewed all nursing notes, pharmacy notes, consultant notes,  vitals, pertinent old records  I have discussed plan of care as described above with RN , patient  on 03/12/2020  Voice Recognition /Dragon dictation system was used to create this note, attempts have been made to correct errors. Please contact the author with questions and/or clarifications.   Florencia Reasons, MD PhD FACP [facility]  Available via Epic secure chat  7am-7pm for nonurgent issues Please page for urgent issues To page the attending provider between 7A-7P or the covering provider during after hours 7P-7A, please log into the web site [email or web address] and access using universal [place] password for that web site. If you do not have the password, please  call the hospital operator.    03/12/2020, 8:20 AM

## 2020-03-13 DIAGNOSIS — G9340 Encephalopathy, unspecified: Secondary | ICD-10-CM | POA: Diagnosis not present

## 2020-03-13 LAB — SARS CORONAVIRUS 2 BY RT PCR (HOSPITAL ORDER, PERFORMED IN ~~LOC~~ HOSPITAL LAB): SARS Coronavirus 2: NEGATIVE

## 2020-03-13 MED ORDER — GUAIFENESIN ER 600 MG PO TB12
600.0000 mg | ORAL_TABLET | Freq: Two times a day (BID) | ORAL | Status: DC
  Administered 2020-03-13 – 2020-03-18 (×11): 600 mg via ORAL
  Filled 2020-03-13 (×11): qty 1

## 2020-03-13 NOTE — TOC Progression Note (Signed)
Transition of Care [facility]) - Progression Note    Patient Details  Name: Brianna Porter MRN: [id number] Date of Birth: 06-14-1961  Transition of Care [facility]) CM/SW [place], [place] Phone Number: 03/13/2020, 2:40 PM  Clinical Narrative:    CSW attempted to make contact with [facility] liaison to have insurance authorization started. CSW left a voicemail, awaiting a call back.    Expected Discharge Plan: [place] Barriers to Discharge: Continued Medical Work up, Ship broker  Expected Discharge Plan and Services Expected Discharge Plan: [place] In-house Referral: Clinical Social Work     Living arrangements for the past 2 months: Single Family Home                                       Social Determinants of Health (SDOH) Interventions    Readmission Risk Interventions Readmission Risk Prevention Plan 07/13/2019  Transportation Screening Complete  PCP or Specialist Appt within 3-5 Days Complete  HRI or [place] Complete  Social Work Consult for [place] Planning/Counseling Complete  Palliative Care Screening Not Applicable  Medication Review [employer] photographer) Complete

## 2020-03-13 NOTE — Progress Notes (Signed)
PROGRESS NOTE    Brianna Porter  JQZ:[id number] DOB: 10-14-1960 DOA: 03/04/2020 PCP: Cher Nakai, MD    Chief Complaint  Patient presents with  . Weakness  . right leg numbness    Brief Narrative:  Brief Narrative:  59 year old with a history of atrial fibrillation on Eliquis, nonischemic cardiomyopathy, depression with anxiety, chronic anemia, and HTN who presented to the ED 7/17 with bilateral lower extremity paresthesias, right hand weakness, intermittent limb jerking, and convulsions.  She reportedly generalized seizure-like episode roughly 2 years ago, and a second similar episode 3 days ago after which she refused transport to the hospital.  She stated that her paresthesias have been present for 3 to 4 days and involve both feet and her right hand.  She felt that her right hand had been weak and lacking coordination for 2 days causing her to drop things.  She reports progressive worsening of generalized weakness such that she transition from using a cane to walk to using a walker, and is now fully dependent upon a wheelchair.  In the ED CT head was unrevealing for acute abnormalities.  MRI was negative for acute abnormalities.  Vital signs were otherwise stable.  Significant Events: 7/17 admit via [facility] 7/17 EEG no evidence of seizure or epileptiform discharges  Antimicrobials:  None  Subjective:  She is very weak, but improving,oriented x3 Report chronic left shoulder pain, not able to lift left arm above left shoulder,  Complaining of nasal congestion ,denies headache,  denies knee pain She wants to try got out of bed and sit in chair today Complaining of being constipated  Assessment & Plan:   Principal Problem:   Encephalopathy Active Problems:   Type 2 diabetes mellitus with complication, without long-term current use of insulin (HCC)   Anemia   CKD (chronic kidney disease) stage 3, GFR 30-59 ml/min   Persistent atrial fibrillation (HCC)   Essential  hypertension   CAD (coronary artery disease)   NICM (nonischemic cardiomyopathy) (HCC)   Elevated liver function tests   Convulsions (HCC)  Severe hypothyroidism - possible early myxedema TSH markedly elevated at 74 - stopped amiodarone - free T4 very low and repeat TSH remains markedly elevated - no hypothermia, hypoglycemia, hypoventilation, or hypotension, but bradycardia and hyponatremia are present and tremors and parathesias are felt to be a neurologic manifestation - will tx as a developing myxedema in absence of coma - hold on T3 dosing given cardiac hx/difficult Afib  Continue Synthroid 100 mcg mg daily Will need to repeat TSH in 4 to 6 weeks  Adrenal insufficiency  Random serum cortisol quite low at 1.5 - ACTH stim test indicates brisk adrenal response to cosyntropin dosing - begin hydrocortisone replacement with IV stress dosing x24hrs followed by oral dosing -patient vehemently denies use of steroids chronically or even acutely recently -studies appear suggestive of a tertiary or secondary adrenal insufficiency but there is no clear reason this will be the case -follow with establishment of hydrocortisone dosing -would benefit from outpatient endocrinology evaluation once stabilized Continue Cortef 2 mg twice a day  Sign/Sx complex: -- Bilateral foot paresthesias of about 3 days' duration, worse on the right -- Right hand paresthesias of about 3 days' duration -- Right hand clumsiness and weakness of about 1 day's duration -- RLE weakness of about 1 day's duration -- Intermittent jerking of individual limbs for the past 2 years - generalized convulsions x2  MRI brain with no acute abnormality noted - EEG unrevealing - MRI of cervical  thoracic and lumbar spine revealed only mild lumbar spondylosis but no acute findings otherwise -folic acid level normal -B12 level normal -ammonia normal -UDS unrevealing - stopped neurontin as Neurology felt this was contributing - sx seem to be  improving slowly - perhaps these sx are also a manifestation of her mutliple endocrine derangements   Atrial fibrillation - chronic CHA2DS2-VASc 3 - resumed Eliquis - stopped amiodarone due to qtc prolongation (cardiology recommended should not ever restart on amiodarone again please see cardiology note from 7/19 and 7/22)-   Continue Toprol XL 100 mg daily, currently sinus rhythm with intermittent sinus bradycardia and first-degree AV block -Cardiology has signed off on 7/22, recommend follow-up with Dr. Bettina Gavia in 2 to 3 weeks.  QTC prolongation QTC on June 16 was 550 Repeat EKG QTC normalized to 437 on 7/23  Nonischemic systolic congestive heart failure EF 20-25% September 2020 -repeat echo with improved left ventricular EF to 45 to 50%  euvolemic at present  Cardiology recommend  adding losartan 25 mg po daily once her crea at baseline Cardiology signed off on 7/22  Elevated transaminases Hepatitis panel negative  liver US "Changes of fatty infiltration of the liver. No other focal abnormality is noted." Could be due to amiodarone as well, as currently patient does not appear to have acute heart failure less likely from liver congestion.  Hypokalemia Corrected with supplementation  HTN Blood pressure start to trend up, continue metoprolol 100 mg daily, will give 1 dose of IV Lasix, repeat BMP in the morning,  Cardiology recommend  adding losartan 25 mg po daily once her crea at baseline, cr still fluctuating   CKD stage IIIb/anemia of chronic disease Baseline creatinine 1.36 - appear back to baseline w/ gentle hydration  -repeat bmp in am   Depression/anxiety Mood is stable at the present time   Left shoulder pain -subacute Patient tells me she was due to see an orthopedist in [place] for this pain in the outpatient setting -plain film XR 7/6 did not reveal an acute finding  MRI left shoulder obtained due to persistent symptoms, case discussed with orthopedic  surgeon on-call Dr. Erlinda Hong who reviewed MRI and advised that  MRI is consistent with chronic rotator cuff arthropathy  She has been on oral hydrocortisone (indicated for other reasons noted above),  but symptom persists, shoulder injection on Monday by IR  Right knee deformity: Right leg limited range of motion, right leg give out for a month Right knee x ray "No fracture or malalignment. Mild lateral and patellofemoral degenerative change. No large knee effusion.  IMPRESSION: Mild degenerative change." Outpatient follow-up with orthopedics, consider outpatient knee mri.  Numerous skin wounds/blisters /ecchymosis  pressure Injury POA: N/A Measurement: too numerous to measure each, the largest seen was 0.5cm x 0.8cm x 0.1cm Wound bed: bleeding to red, dry Drainage (amount, consistency, odor) serosanguinous to serous Periwound: ecchymotic Dressing procedure/placement/frequency: These lesions respond to daily application of xeroform gauze, patient reports that they have recently run out of xeroform and have been using dry gauze in its place, which becomes adherent and causes more trauma upon removal.  I have provided Nursing with guidance in the care of these lesions using saline to cleanse, then xeroform antimicrobial nonadherent gauze secured with Kerlix roll gauze except to face where dressing can be secured with a silicone foam. Skin lesions are improving  Constipation: Start stool softener  Nasal congestion: Flonase and Mucinex  Failure to thrive need SNF placement   DVT prophylaxis:  apixaban (ELIQUIS)  tablet 5 mg   Code Status:full Family Communication: Daughter over phone with patient permission on 7/23 Disposition:   Status is: Inpatient  Dispo:  Patient From: Home  Planned Disposition: [place]  Expected discharge date: 03/14/20  Medically stable for discharge: Hold Eliquis for 4 doses, then shoulder injection on Monday morning, resume Eliquis after  shoulder injection and hopefully can discharge Monday afternoon to SNF covid screening ordered on 7/25   Consultants:   Neurology   Cardiology   Wound care  Orthopedic  Radiology  Procedures:  Plan for left shoulder aspiration and steroid injection on Monday  Antimicrobials:   None     Objective: Vitals:   03/12/20 2007 03/12/20 2341 03/13/20 0354 03/13/20 0830  BP: (!) 140/65 (!) 146/55 (!) 158/68 (!) 167/65  Pulse: 67 66 63 68  Resp: 18 19 16 18   Temp: 98.7 F (37.1 C) 98.2 F (36.8 C) 97.9 F (36.6 C) 97.9 F (36.6 C)  TempSrc: Oral Oral Oral Oral  SpO2: 100% 100% 99% 100%  Weight:      Height:       No intake or output data in the 24 hours ending 03/13/20 1012 Filed Weights   03/07/20 0500 03/08/20 0500 03/12/20 0400  Weight: 70.4 kg 72.3 kg (!) 102.5 kg    Examination:  General exam: Chronically ill-appearing, weak, alert, oriented x3 Respiratory system: Diminished at bases, no wheezing, no rales, no rhonchi, respiratory effort normal. Cardiovascular system: S1 & S2 heard, RRR. No JVD, no murmur, No pedal edema. Gastrointestinal system: Abdomen is nondistended, soft and nontender. No organomegaly or masses felt. Normal bowel sounds heard. Central nervous system:  orientedx3. No focal neurological deficits. Extremities: Generalized weakness, limited range of motion left shoulder due to pain, right knee deformity Skin: Scattered ecchymosis, blisters Psychiatry: Pleasant, calm and cooperative    Data Reviewed: I have personally reviewed following labs and imaging studies  CBC: Recent Labs  Lab 03/07/20 1223 03/08/20 0412  WBC 5.9 6.6  HGB 8.9* 8.6*  HCT 28.6* 27.5*  MCV 99.7 99.6  PLT 324 409    Basic Metabolic Panel: Recent Labs  Lab 03/06/20 1109 03/07/20 1223 03/08/20 0412 03/09/20 0423 03/10/20 0541  NA  --  134* 133* 131* 133*  K  --  3.1* 4.3 4.5 4.5  CL  --  95* 99 98 100  CO2  --  27 25 24 26   GLUCOSE  --  118* 143* 194*  134*  BUN  --  12 12 11 14   CREATININE  --  1.36* 1.40* 1.62* 1.30*  CALCIUM  --  9.0 9.0 9.4 9.1  MG 1.9  --   --  2.0  --   PHOS  --   --   --  2.7  --     GFR: Estimated Creatinine Clearance: 52.9 mL/min (A) (by C-G formula based on SCr of 1.3 mg/dL (H)).  Liver Function Tests: Recent Labs  Lab 03/07/20 1223 03/09/20 0423 03/10/20 0541  AST 52* 59* 55*  ALT 80* 86* 81*  ALKPHOS 105 102 86  BILITOT 0.8 0.8 0.8  PROT 6.1* 6.2* 5.6*  ALBUMIN 3.1* 3.2* 2.9*    CBG: No results for input(s): GLUCAP in the last 168 hours.   Recent Results (from the past 240 hour(s))  Urine culture     Status: Abnormal   Collection Time: 03/04/20 10:56 PM   Specimen: Urine, Clean Catch  Result Value Ref Range Status   Specimen Description URINE, CLEAN  CATCH  Final   Special Requests   Final    NONE Performed at [facility], [place] [address], [place], [place] [address]    Culture MULTIPLE SPECIES PRESENT, SUGGEST RECOLLECTION (A)  Final   Report Status 03/06/2020 FINAL  Final  SARS Coronavirus 2 by RT PCR (hospital order, performed in [facility] hospital lab) Nasopharyngeal Nasopharyngeal Swab     Status: None   Collection Time: 03/05/20  6:26 AM   Specimen: Nasopharyngeal Swab  Result Value Ref Range Status   SARS Coronavirus 2 NEGATIVE NEGATIVE Final    Comment: (NOTE) SARS-CoV-2 target nucleic acids are NOT DETECTED.  The SARS-CoV-2 RNA is generally detectable in upper and lower respiratory specimens during the acute phase of infection. The lowest concentration of SARS-CoV-2 viral copies this assay can detect is 250 copies / mL. A negative result does not preclude SARS-CoV-2 infection and should not be used as the sole basis for treatment or other patient management decisions.  A negative result may occur with improper specimen collection / handling, submission of specimen other than nasopharyngeal swab, presence of viral mutation(s) within the areas targeted by this assay,  and inadequate number of viral copies (<250 copies / mL). A negative result must be combined with clinical observations, patient history, and epidemiological information.  Fact Sheet for Patients:   StrictlyIdeas.no  Fact Sheet for Healthcare Providers: BankingDealers.co.za  This test is not yet approved or  cleared by the [place] FDA and has been authorized for detection and/or diagnosis of SARS-CoV-2 by FDA under an Emergency Use Authorization (EUA).  This EUA will remain in effect (meaning this test can be used) for the duration of the COVID-19 declaration under Section 564(b)(1) of the Act, 21 U.S.C. section 360bbb-3(b)(1), unless the authorization is terminated or revoked sooner.  Performed at [facility], [place] [address], [place], [place] [address]   Culture, blood (routine x 2)     Status: None (Preliminary result)   Collection Time: 03/10/20 10:47 AM   Specimen: BLOOD LEFT HAND  Result Value Ref Range Status   Specimen Description BLOOD LEFT HAND  Final   Special Requests   Final    BOTTLES DRAWN AEROBIC ONLY Blood Culture results may not be optimal due to an inadequate volume of blood received in culture bottles   Culture   Final    NO GROWTH 3 DAYS Performed at [facility], [place] [address] [place], [place] [address]    Report Status PENDING  Incomplete  Culture, blood (routine x 2)     Status: None (Preliminary result)   Collection Time: 03/10/20 10:50 AM   Specimen: BLOOD RIGHT HAND  Result Value Ref Range Status   Specimen Description BLOOD RIGHT HAND  Final   Special Requests   Final    BOTTLES DRAWN AEROBIC ONLY Blood Culture adequate volume   Culture   Final    NO GROWTH 3 DAYS Performed at [facility], [place] [address] [place], [place] [address]    Report Status PENDING  Incomplete         Radiology Studies: DG Knee 1-2 Views Right  Result Date: 03/11/2020 CLINICAL DATA:   Right-sided knee pain EXAM: RIGHT KNEE - 1-2 VIEW COMPARISON:  01/19/2019 FINDINGS: No fracture or malalignment. Mild lateral and patellofemoral degenerative change. No large knee effusion. IMPRESSION: Mild degenerative change. Electronically Signed   By: Donavan Foil M.D.   On: 03/11/2020 17:02  Scheduled Meds: . [START ON 03/14/2020] apixaban  5 mg Oral BID  . DULoxetine  60 mg Oral BID  . fluticasone  1 spray Each Nare Daily  . hydrocortisone  5 mg Oral BID  . levothyroxine  100 mcg Oral Q0600  . metoprolol succinate  100 mg Oral Daily  . nicotine  21 mg Transdermal Daily  . pantoprazole  40 mg Oral BID  . senna-docusate  1 tablet Oral BID  . sodium chloride flush  3 mL Intravenous Q12H   Continuous Infusions:   LOS: 7 days     Time spent: 97mins I have personally reviewed and interpreted on  03/13/2020 daily labs, tele strips, imagings as discussed above under date review session and assessment and plans.  I reviewed all nursing notes, pharmacy notes, consultant notes,  vitals, pertinent old records  I have discussed plan of care as described above with RN , patient  on 03/13/2020  Voice Recognition /Dragon dictation system was used to create this note, attempts have been made to correct errors. Please contact the author with questions and/or clarifications.   Florencia Reasons, MD PhD FACP [facility]  Available via Epic secure chat 7am-7pm for nonurgent issues Please page for urgent issues To page the attending provider between 7A-7P or the covering provider during after hours 7P-7A, please log into the web site [email or web address] and access using universal [place] password for that web site. If you do not have the password, please call the hospital operator.    03/13/2020, 10:12 AM

## 2020-03-14 ENCOUNTER — Inpatient Hospital Stay (HOSPITAL_COMMUNITY): Payer: Medicare HMO

## 2020-03-14 DIAGNOSIS — D509 Iron deficiency anemia, unspecified: Secondary | ICD-10-CM | POA: Diagnosis not present

## 2020-03-14 IMAGING — RF DG FLUORO GUIDE NDL PLC/BX
4 series · 7 of 7 positions shown · non-contrast
Comparison: none

CLINICAL DATA: 58-year-old female with history of left-sided
shoulder pain.

EXAM:
LEFT SHOULDER INJECTION UNDER FLUOROSCOPY
TECHNIQUE: An appropriate skin entrance site was determined. The site was
marked, prepped with Betadine, draped in the usual sterile fashion,
and infiltrated locally with buffered Lidocaine. 22 gauge spinal
needle was advanced to the superomedial margin of the humeral head
under intermittent fluoroscopy. No immediate complication.
FLUOROSCOPY TIME:  Fluoroscopy Time:  42 seconds
Radiation Exposure Index (if provided by the fluoroscopic device):
2.1 mGy

[Series 1: cp_standard · 0.18mm/px · 1 of 1 slices shown (1 of 4)]
[im 1/1]
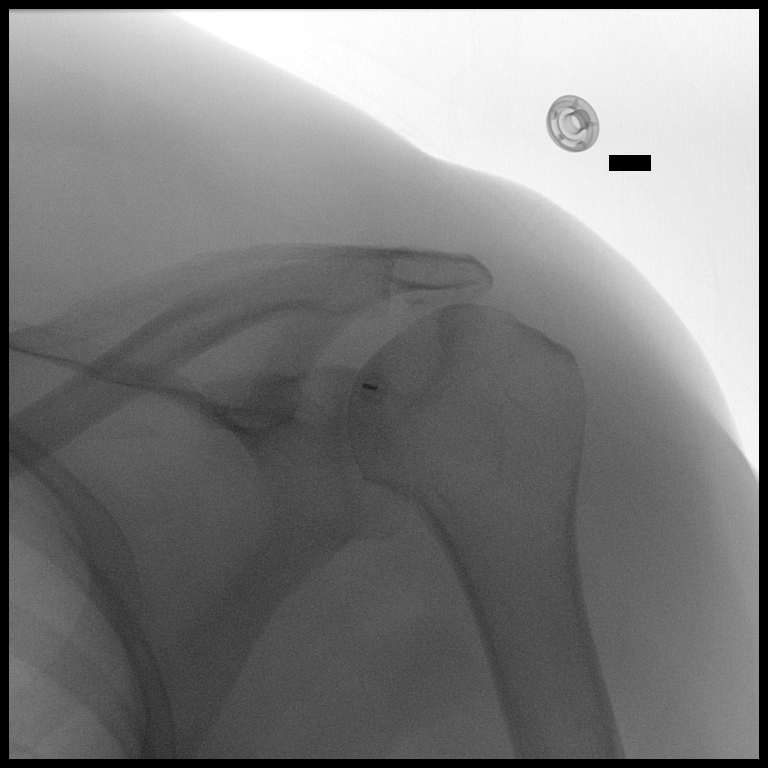

[Series 2: cp_standard · 0.18mm/px · 1 of 1 slices shown (2 of 4)]
[im 1/1]
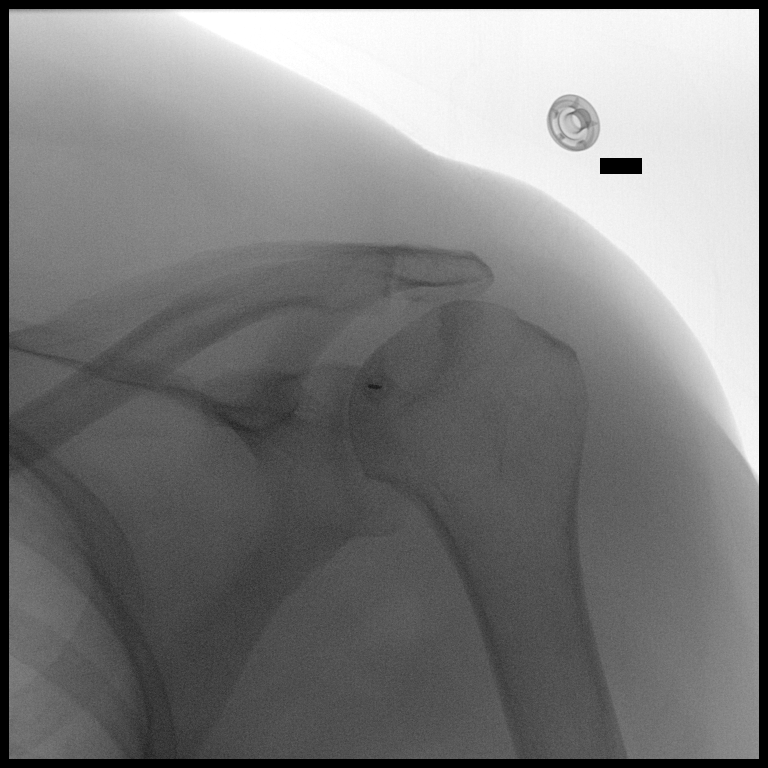

[Series 3: cp_standard · 0.18mm/px · 1 of 1 slices shown (3 of 4)]
[im 1/1]
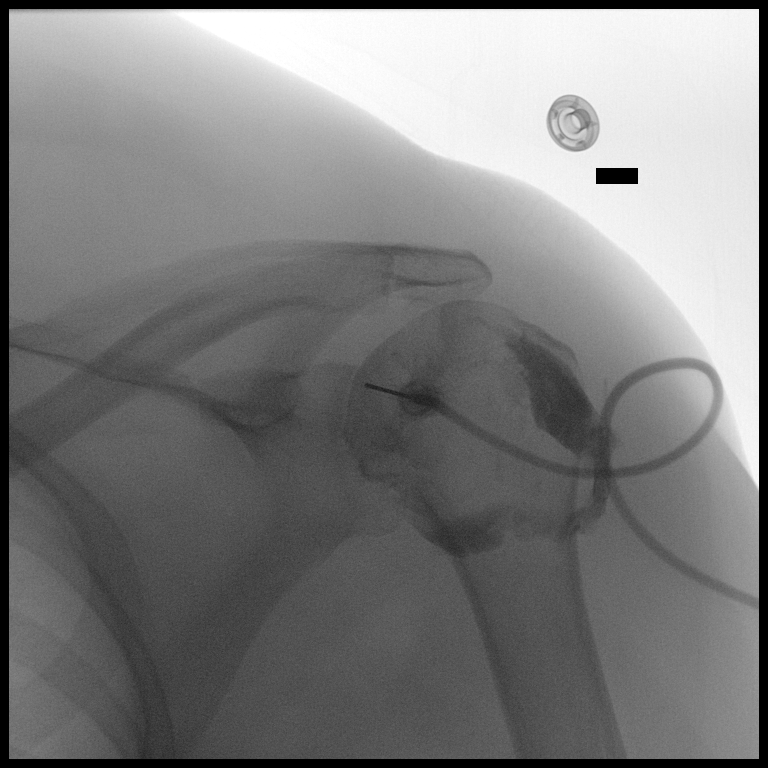

[Series 4: cp_standard · 0.36mm/px · 4 of 28 frames shown (4 of 4)]
[frame 1/28]
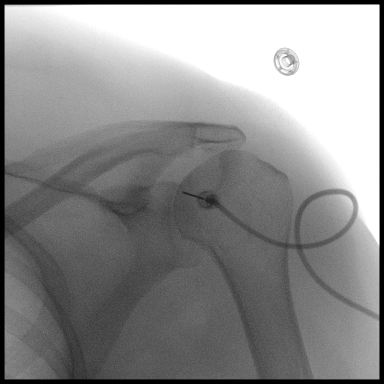
[frame 5/28]
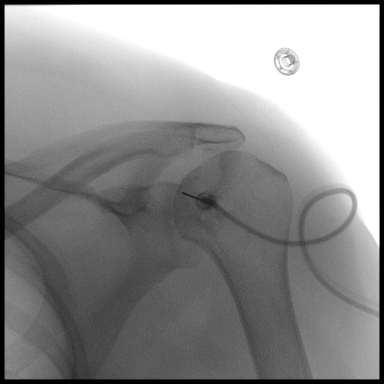
[frame 15/28]
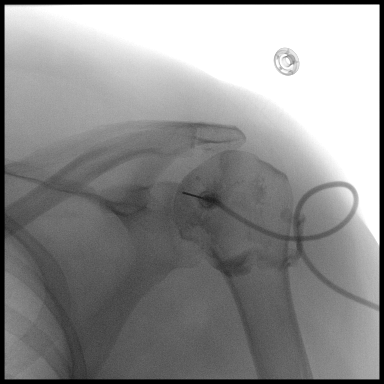
[frame 24/28]
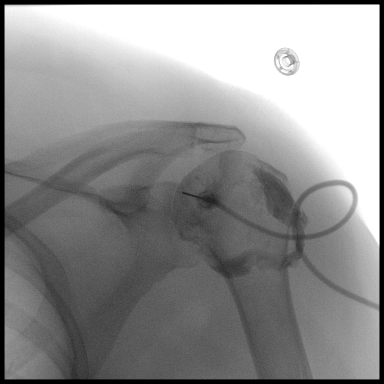

[7 of 7 positions shown; findings below may reference images not displayed]

FINDINGS: After placement of the needle, multiple attempts were made to
aspirate fluid from the joint space, which were unsuccessful.
Subsequently, a small amount of Omnipaque 180 was injected into the
joint space confirming proper placement of the needle. This contrast
medium was aspirated as much as possible, and subsequently the joint
was infused with 80 mg of Depo-Medrol diluted in 5 mL of
bupivacaine.
IMPRESSION: Technically successful left shoulder injection for MRI.

## 2020-03-14 MED ORDER — BUPIVACAINE HCL (PF) 0.25 % IJ SOLN
INTRAMUSCULAR | Status: AC
  Administered 2020-03-14: 5 mL via INTRA_ARTICULAR
  Filled 2020-03-14: qty 30

## 2020-03-14 MED ORDER — LIDOCAINE HCL (PF) 1 % IJ SOLN
5.0000 mL | Freq: Once | INTRAMUSCULAR | Status: AC
  Administered 2020-03-14: 5 mL via INTRADERMAL

## 2020-03-14 MED ORDER — CLONIDINE HCL 0.1 MG PO TABS
0.2000 mg | ORAL_TABLET | Freq: Once | ORAL | Status: AC
  Administered 2020-03-14: 0.2 mg via ORAL
  Filled 2020-03-14: qty 2

## 2020-03-14 MED ORDER — METHYLPREDNISOLONE ACETATE 40 MG/ML INJ SUSP (RADIOLOG
80.0000 mg | Freq: Once | INTRAMUSCULAR | Status: AC
  Administered 2020-03-14: 80 mg via INTRA_ARTICULAR

## 2020-03-14 MED ORDER — HYDRALAZINE HCL 20 MG/ML IJ SOLN
10.0000 mg | Freq: Once | INTRAMUSCULAR | Status: AC
  Administered 2020-03-14: 10 mg via INTRAVENOUS
  Filled 2020-03-14: qty 1

## 2020-03-14 MED ORDER — FUROSEMIDE 40 MG PO TABS
40.0000 mg | ORAL_TABLET | Freq: Every day | ORAL | Status: DC
  Administered 2020-03-14 – 2020-03-18 (×5): 40 mg via ORAL
  Filled 2020-03-14 (×5): qty 1

## 2020-03-14 MED ORDER — AMLODIPINE BESYLATE 10 MG PO TABS
10.0000 mg | ORAL_TABLET | Freq: Every day | ORAL | Status: DC
  Administered 2020-03-14 – 2020-03-18 (×5): 10 mg via ORAL
  Filled 2020-03-14 (×5): qty 1

## 2020-03-14 MED ORDER — METHYLPREDNISOLONE ACETATE 80 MG/ML IJ SUSP
INTRAMUSCULAR | Status: AC
  Filled 2020-03-14: qty 1

## 2020-03-14 MED ORDER — IOHEXOL 180 MG/ML  SOLN
20.0000 mL | Freq: Once | INTRAMUSCULAR | Status: AC | PRN
  Administered 2020-03-14: 10 mL via INTRA_ARTICULAR

## 2020-03-14 MED ORDER — BUPIVACAINE HCL (PF) 0.25 % IJ SOLN
30.0000 mL | Freq: Once | INTRAMUSCULAR | Status: DC
  Filled 2020-03-14 (×2): qty 30

## 2020-03-14 NOTE — Progress Notes (Signed)
PROGRESS NOTE    Brianna Porter  VQQ:[id number] DOB: 1961-03-28 DOA: 03/04/2020 PCP: Cher Nakai, MD   Brief Narrative:  Patient is a 59 year old female with history of chronic A. fib on Eliquis, nonischemic cardiomyopathy, depression with anxiety, chronic anemia, hypertension who presented to the emergency department on 7/17 with bilateral lower extremity paresthesias, right leg weakness, intermittent limb jerking,suspected  convulsions.  CT head, MRI done in the emergency room was negative for acute intracranial abnormalities.  EEG did not show any seizure activity.  She was found to have severe hypothyroidism after admission and started on Synthyroid.  Also found to have adrenal insufficiency and started on steroids.  Patient seen by PT/OT and recommended skilled nursing facility.  Patient is medically stable for discharge to skilled nursing facility as soon as bed is available.   Assessment & Plan:   Principal Problem:   Encephalopathy Active Problems:   Type 2 diabetes mellitus with complication, without long-term current use of insulin (HCC)   Anemia   CKD (chronic kidney disease) stage 3, GFR 30-59 ml/min   Persistent atrial fibrillation (HCC)   Essential hypertension   CAD (coronary artery disease)   NICM (nonischemic cardiomyopathy) (HCC)   Elevated liver function tests   Convulsions (HCC)   Severe hypothyroidism: TSH was elevated at 74.  Amiodarone was stopped.  Free T4 was also very low.  No finding of hypothermia, hypoglycemia.  Continue Synthyroid 100 mcg daily.  Plan to recheck TSH in 4 to 6 weeks.  Adrenal insufficiency: Random serum cortisol was low.  On Cortef 2 mg twice a day.  She needs to follow-up with endocrinology as an outpatient.  Paresthesia/weakness/intermittent jerking: MRI of the brain did not show any acute intracranial abnormality.  EEG denies any seizures.  MRI of the cervical/thoracic/lumbar spine revealed only mild lumbar spondylosis with no acute  finding.  Normal folic acid, vitamin E33 level.  Normal ammonia level.  UDS unrevealing.  Stopped Neurontin as recommended by neurology.  Symptoms have improved.  Chronic A. fib: On Eliquis.  Currently rate is controlled.  Stopped amiodarone due to prolonged QTC cardiology recommended not  to start on amiodarone in the future given her hypothyroidism.  Continue Toprol for rate control.  QTC prolongation: QTC on June 16 was 550.  Repeat EKG showed QTC normalized to 437 ms  Nonischemic cardiomyopathy/systolic congestive heart failure: Ejection fraction of 20 to 25% as per September 2020 but repeat echo showed improved ejection fraction to 40 to 45%.  Currently euvolemic.  On losartan, Lasix  Elevated transaminases: Hepatitis panel negative.  Ultrasound of the liver showed fatty changes.  Hypertension: Monitor blood pressure.  Continue current medications.  CKD stage IIIb/anemia of chronic disease: Currently kidney function at baseline.  Depression/anxiety: Currently mood is stable.  Left shoulder pain: Underwent IR guided arthrocentesis on 03/14/20.  We will follow-up fluid analysis.  She will follow-up with orthopedics as an outpatient.  MRI was consistent with chronic rotator cuff arthropathy.  Also received steroid injection.  Right knee deformity: Continue supportive care.  Imaging showed mild degenerative changes.  Follow-up with orthopedics as an outpatient  Numerous skin wounds/blisters/ecchymosis: Continue supportive care, wound care.  Weakness/deconditioning: PT/OT recommended skilled nursing facility.             DVT prophylaxis: Eliquis Code Status: Full Family Communication: None present at the bedside  status is: Inpatient  Remains inpatient appropriate because:Unsafe d/c plan   Dispo:  Patient From: Home  Planned Disposition: [place]  Expected discharge  date: 03/15/20  Medically stable for discharge: yes    Consultants: Orthopedics,  cardiology, neurology  Procedures: Arthrocentesis  Antimicrobials:  Anti-infectives (From admission, onward)   None      Subjective: Patient seen and examined at the bedside this afternoon.  Currently she is hemodynamically stable.  She just returned from arthrocentesis.  Left shoulder pain is better.  She complains of swelling of her bilateral upper extremities and was requesting for Lasix pill to be restarted.  Objective: Vitals:   03/14/20 0215 03/14/20 0319 03/14/20 0932 03/14/20 1156  BP: (!) 200/98 (!) 163/69 (!) 197/77 (!) 168/88  Pulse:  71 71 68  Resp:  18 18 18   Temp:  98.1 F (36.7 C) 97.8 F (36.6 C) 97.6 F (36.4 C)  TempSrc:  Oral Oral Oral  SpO2:  99% 99% 100%  Weight:      Height:        Intake/Output Summary (Last 24 hours) at 03/14/2020 1448 Last data filed at 03/14/2020 1100 Gross per 24 hour  Intake --  Output 501 ml  Net -501 ml   Filed Weights   03/07/20 0500 03/08/20 0500 03/12/20 0400  Weight: 70.4 kg 72.3 kg (!) 102.5 kg    Examination:  General exam: Appears calm and comfortable ,Not in distress, morbidly obese  Respiratory system: Bilateral equal air entry, normal vesicular breath sounds, no wheezes or crackles  Cardiovascular system: Irregularly irregular rhythm .No JVD, murmurs, rubs, gallops or clicks. No pedal edema. Gastrointestinal system: Abdomen is nondistended, soft and nontender. No organomegaly or masses felt. Normal bowel sounds heard. Central nervous system: Alert and oriented.  Extremities: No edema, no clubbing ,no cyanosis Skin: Numerous skin breakdowns, ecchymoses   Data Reviewed: I have personally reviewed following labs and imaging studies  CBC: Recent Labs  Lab 03/08/20 0412  WBC 6.6  HGB 8.6*  HCT 27.5*  MCV 99.6  PLT 161   Basic Metabolic Panel: Recent Labs  Lab 03/08/20 0412 03/09/20 0423 03/10/20 0541  NA 133* 131* 133*  K 4.3 4.5 4.5  CL 99 98 100  CO2 25 24 26   GLUCOSE 143* 194* 134*  BUN 12  11 14   CREATININE 1.40* 1.62* 1.30*  CALCIUM 9.0 9.4 9.1  MG  --  2.0  --   PHOS  --  2.7  --    GFR: Estimated Creatinine Clearance: 52.9 mL/min (A) (by C-G formula based on SCr of 1.3 mg/dL (H)). Liver Function Tests: Recent Labs  Lab 03/09/20 0423 03/10/20 0541  AST 59* 55*  ALT 86* 81*  ALKPHOS 102 86  BILITOT 0.8 0.8  PROT 6.2* 5.6*  ALBUMIN 3.2* 2.9*   No results for input(s): LIPASE, AMYLASE in the last 168 hours. No results for input(s): AMMONIA in the last 168 hours. Coagulation Profile: No results for input(s): INR, PROTIME in the last 168 hours. Cardiac Enzymes: No results for input(s): CKTOTAL, CKMB, CKMBINDEX, TROPONINI in the last 168 hours. BNP (last 3 results) Recent Labs    04/23/19 1233 06/12/19 1705 10/16/19 1020  PROBNP 3,353* 12,215* 449*   HbA1C: No results for input(s): HGBA1C in the last 72 hours. CBG: No results for input(s): GLUCAP in the last 168 hours. Lipid Profile: No results for input(s): CHOL, HDL, LDLCALC, TRIG, CHOLHDL, LDLDIRECT in the last 72 hours. Thyroid Function Tests: No results for input(s): TSH, T4TOTAL, FREET4, T3FREE, THYROIDAB in the last 72 hours. Anemia Panel: No results for input(s): VITAMINB12, FOLATE, FERRITIN, TIBC, IRON, RETICCTPCT in the last 72  hours. Sepsis Labs: No results for input(s): PROCALCITON, LATICACIDVEN in the last 168 hours.  Recent Results (from the past 240 hour(s))  Urine culture     Status: Abnormal   Collection Time: 03/04/20 10:56 PM   Specimen: Urine, Clean Catch  Result Value Ref Range Status   Specimen Description URINE, CLEAN CATCH  Final   Special Requests   Final    NONE Performed at [facility], [address] [place], [place] [address]    Culture MULTIPLE SPECIES PRESENT, SUGGEST RECOLLECTION (A)  Final   Report Status 03/06/2020 FINAL  Final  SARS Coronavirus 2 by RT PCR (hospital order, performed in [facility] hospital lab) Nasopharyngeal Nasopharyngeal Swab      Status: None   Collection Time: 03/05/20  6:26 AM   Specimen: Nasopharyngeal Swab  Result Value Ref Range Status   SARS Coronavirus 2 NEGATIVE NEGATIVE Final    Comment: (NOTE) SARS-CoV-2 target nucleic acids are NOT DETECTED.  The SARS-CoV-2 RNA is generally detectable in upper and lower respiratory specimens during the acute phase of infection. The lowest concentration of SARS-CoV-2 viral copies this assay can detect is 250 copies / mL. A negative result does not preclude SARS-CoV-2 infection and should not be used as the sole basis for treatment or other patient management decisions.  A negative result may occur with improper specimen collection / handling, submission of specimen other than nasopharyngeal swab, presence of viral mutation(s) within the areas targeted by this assay, and inadequate number of viral copies (<250 copies / mL). A negative result must be combined with clinical observations, patient history, and epidemiological information.  Fact Sheet for Patients:   StrictlyIdeas.no  Fact Sheet for Healthcare Providers: BankingDealers.co.za  This test is not yet approved or  cleared by the [place] FDA and has been authorized for detection and/or diagnosis of SARS-CoV-2 by FDA under an Emergency Use Authorization (EUA).  This EUA will remain in effect (meaning this test can be used) for the duration of the COVID-19 declaration under Section 564(b)(1) of the Act, 21 U.S.C. section 360bbb-3(b)(1), unless the authorization is terminated or revoked sooner.  Performed at [facility], [place] [address], [place], [place] [address]   Culture, blood (routine x 2)     Status: None (Preliminary result)   Collection Time: 03/10/20 10:47 AM   Specimen: BLOOD LEFT HAND  Result Value Ref Range Status   Specimen Description BLOOD LEFT HAND  Final   Special Requests   Final    BOTTLES DRAWN AEROBIC ONLY Blood Culture  results may not be optimal due to an inadequate volume of blood received in culture bottles   Culture   Final    NO GROWTH 4 DAYS Performed at [facility], [place] [address], [place], [place] [address]    Report Status PENDING  Incomplete  Culture, blood (routine x 2)     Status: None (Preliminary result)   Collection Time: 03/10/20 10:50 AM   Specimen: BLOOD RIGHT HAND  Result Value Ref Range Status   Specimen Description BLOOD RIGHT HAND  Final   Special Requests   Final    BOTTLES DRAWN AEROBIC ONLY Blood Culture adequate volume   Culture   Final    NO GROWTH 4 DAYS Performed at [facility], [place] [address], [place], [place] [address]    Report Status PENDING  Incomplete  SARS Coronavirus 2 by RT PCR (hospital order, performed in [facility] hospital lab) Nasopharyngeal  Nasopharyngeal Swab     Status: None   Collection Time: 03/13/20  1:12 PM   Specimen: Nasopharyngeal Swab  Result Value Ref Range Status   SARS Coronavirus 2 NEGATIVE NEGATIVE Final    Comment: (NOTE) SARS-CoV-2 target nucleic acids are NOT DETECTED.  The SARS-CoV-2 RNA is generally detectable in upper and lower respiratory specimens during the acute phase of infection. The lowest concentration of SARS-CoV-2 viral copies this assay can detect is 250 copies / mL. A negative result does not preclude SARS-CoV-2 infection and should not be used as the sole basis for treatment or other patient management decisions.  A negative result may occur with improper specimen collection / handling, submission of specimen other than nasopharyngeal swab, presence of viral mutation(s) within the areas targeted by this assay, and inadequate number of viral copies (<250 copies / mL). A negative result must be combined with clinical observations, patient history, and epidemiological information.  Fact Sheet for Patients:   StrictlyIdeas.no  Fact Sheet for Healthcare  Providers: BankingDealers.co.za  This test is not yet approved or  cleared by the [place] FDA and has been authorized for detection and/or diagnosis of SARS-CoV-2 by FDA under an Emergency Use Authorization (EUA).  This EUA will remain in effect (meaning this test can be used) for the duration of the COVID-19 declaration under Section 564(b)(1) of the Act, 21 U.S.C. section 360bbb-3(b)(1), unless the authorization is terminated or revoked sooner.  Performed at [facility], [place] [address], [place], [place] [address]          Radiology Studies: DG FLUORO GUIDED NEEDLE [facility] ASPIRATION/INJECTION LOC  Result Date: 03/14/2020 CLINICAL DATA:  59 year old female with history of left-sided shoulder pain. EXAM: LEFT SHOULDER INJECTION UNDER FLUOROSCOPY TECHNIQUE: An appropriate skin entrance site was determined. The site was marked, prepped with Betadine, draped in the usual sterile fashion, and infiltrated locally with buffered Lidocaine. 22 gauge spinal needle was advanced to the superomedial margin of the humeral head under intermittent fluoroscopy. No immediate complication. FLUOROSCOPY TIME:  Fluoroscopy Time:  42 seconds Radiation Exposure Index (if provided by the fluoroscopic device): 2.1 mGy FINDINGS: After placement of the needle, multiple attempts were made to aspirate fluid from the joint space, which were unsuccessful. Subsequently, a small amount of Omnipaque 180 was injected into the joint space confirming proper placement of the needle. This contrast medium was aspirated as much as possible, and subsequently the joint was infused with 80 mg of Depo-Medrol diluted in 5 mL of bupivacaine. IMPRESSION: Technically successful left shoulder injection for MRI. Electronically Signed   By: Vinnie Langton M.D.   On: 03/14/2020 14:34        Scheduled Meds: . apixaban  5 mg Oral BID  . bupivacaine (PF)  30 mL Intra-articular Once  . DULoxetine  60 mg Oral  BID  . fluticasone  1 spray Each Nare Daily  . guaiFENesin  600 mg Oral BID  . hydrocortisone  5 mg Oral BID  . levothyroxine  100 mcg Oral Q0600  . methylPREDNISolone acetate      . metoprolol succinate  100 mg Oral Daily  . nicotine  21 mg Transdermal Daily  . pantoprazole  40 mg Oral BID  . senna-docusate  1 tablet Oral BID  . sodium chloride flush  3 mL Intravenous Q12H   Continuous Infusions:   LOS: 8 days    Time spent: 35 mins,More than 50% of that time was spent in counseling and/or coordination of care.  Shelly Coss, MD [facility] P7/26/2021, 2:48 PM

## 2020-03-14 NOTE — Progress Notes (Signed)
PT Cancellation Note  Patient Details Name: Brianna Porter MRN: [id number] DOB: 1960/12/20   Cancelled Treatment:    Reason Eval/Treat Not Completed: Patient at procedure or test/unavailable  Patient off the unit. Per RN having shoulder aspirated.   Noted need for PT/OT notes for insurance; will attempt to see later today as able.   Arby Barrette, PT Pager [phone]  Brianna Porter 03/14/2020, 2:12 PM

## 2020-03-14 NOTE — Consult Note (Signed)
   [facility] CM Inpatient Consult   03/14/2020  Deosha Werden 1961/08/02 [id number]   Patient screened for high risk score for unplanned readmission, 25%.  Chart reviewed to check if potential [facility] Management services are needed. Per review, patient is being recommended for a skilled nursing facility level of care.   No [facility] Care Management follow up needs at this time is planned if patient goes to a SNF as care needs would be met at that level of care.   Netta Cedars, MSN, [facility] Liaison Nurse Mobile Phone [phone]  Toll free office [phone]

## 2020-03-14 NOTE — TOC Progression Note (Signed)
Transition of Care [facility]) - Progression Note    Patient Details  Name: Brianna Porter MRN: [id number] Date of Birth: [birth year]  Transition of Care [facility]) CM/SW [place], [place] Phone Number: 03/14/2020, 11:19 AM  Clinical Narrative:   CSW contacted Dayna Ramus to initiate insurance authorization with [employer]. Will need updated PT/OT notes, therapy is assigned to see patient today. CSW to follow.    Expected Discharge Plan: [place] Barriers to Discharge: Continued Medical Work up, Ship broker  Expected Discharge Plan and Services Expected Discharge Plan: [place] In-house Referral: Clinical Social Work     Living arrangements for the past 2 months: Single Family Home                                       Social Determinants of Health (SDOH) Interventions    Readmission Risk Interventions Readmission Risk Prevention Plan 07/13/2019  Transportation Screening Complete  PCP or Specialist Appt within 3-5 Days Complete  HRI or [place] Complete  Social Work Consult for [place] Planning/Counseling Complete  Palliative Care Screening Not Applicable  Medication Review Press photographer) Complete

## 2020-03-14 NOTE — Progress Notes (Signed)
Called by RN.  Blood pressure 168/76.  Blood pressure has been 160-200/75-98 throughout the day. Patient is on metoprolol. Added Norvasc 10 mg p.o. daily with first dose now regimen. Patient received hydralazine last night per report had swelling after receiving the medication so hydralazine is not used

## 2020-03-15 DIAGNOSIS — D509 Iron deficiency anemia, unspecified: Secondary | ICD-10-CM | POA: Diagnosis not present

## 2020-03-15 LAB — CULTURE, BLOOD (ROUTINE X 2)
Culture: NO GROWTH
Culture: NO GROWTH
Special Requests: ADEQUATE

## 2020-03-15 LAB — CBC WITH DIFFERENTIAL/PLATELET
Abs Immature Granulocytes: 1.14 10*3/uL — ABNORMAL HIGH (ref 0.00–0.07)
Basophils Absolute: 0.1 10*3/uL (ref 0.0–0.1)
Basophils Relative: 1 %
Eosinophils Absolute: 0 10*3/uL (ref 0.0–0.5)
Eosinophils Relative: 0 %
HCT: 34.9 % — ABNORMAL LOW (ref 36.0–46.0)
Hemoglobin: 11 g/dL — ABNORMAL LOW (ref 12.0–15.0)
Immature Granulocytes: 9 %
Lymphocytes Relative: 10 %
Lymphs Abs: 1.2 10*3/uL (ref 0.7–4.0)
MCH: 30.9 pg (ref 26.0–34.0)
MCHC: 31.5 g/dL (ref 30.0–36.0)
MCV: 98 fL (ref 80.0–100.0)
Monocytes Absolute: 0.3 10*3/uL (ref 0.1–1.0)
Monocytes Relative: 2 %
Neutro Abs: 9.7 10*3/uL — ABNORMAL HIGH (ref 1.7–7.7)
Neutrophils Relative %: 78 %
Platelets: 569 10*3/uL — ABNORMAL HIGH (ref 150–400)
RBC: 3.56 MIL/uL — ABNORMAL LOW (ref 3.87–5.11)
RDW: 14.2 % (ref 11.5–15.5)
WBC: 12.4 10*3/uL — ABNORMAL HIGH (ref 4.0–10.5)
nRBC: 0.2 % (ref 0.0–0.2)

## 2020-03-15 LAB — BASIC METABOLIC PANEL
Anion gap: 12 (ref 5–15)
BUN: 17 mg/dL (ref 6–20)
CO2: 23 mmol/L (ref 22–32)
Calcium: 9.6 mg/dL (ref 8.9–10.3)
Chloride: 97 mmol/L — ABNORMAL LOW (ref 98–111)
Creatinine, Ser: 1.17 mg/dL — ABNORMAL HIGH (ref 0.44–1.00)
GFR calc Af Amer: 59 mL/min — ABNORMAL LOW (ref >60)
GFR calc non Af Amer: 51 mL/min — ABNORMAL LOW (ref >60)
Glucose, Bld: 220 mg/dL — ABNORMAL HIGH (ref 70–99)
Potassium: 4.4 mmol/L (ref 3.5–5.1)
Sodium: 132 mmol/L — ABNORMAL LOW (ref 135–145)

## 2020-03-15 MED ORDER — HYDRALAZINE HCL 20 MG/ML IJ SOLN
10.0000 mg | Freq: Once | INTRAMUSCULAR | Status: AC
  Administered 2020-03-15: 10 mg via INTRAVENOUS
  Filled 2020-03-15: qty 1

## 2020-03-15 MED ORDER — CLONIDINE HCL 0.1 MG PO TABS
0.1000 mg | ORAL_TABLET | Freq: Once | ORAL | Status: AC
  Administered 2020-03-15: 0.1 mg via ORAL
  Filled 2020-03-15: qty 1

## 2020-03-15 MED ORDER — IRBESARTAN 150 MG PO TABS
150.0000 mg | ORAL_TABLET | Freq: Every day | ORAL | Status: DC
  Administered 2020-03-15 – 2020-03-18 (×4): 150 mg via ORAL
  Filled 2020-03-15 (×4): qty 1

## 2020-03-15 NOTE — Progress Notes (Signed)
PROGRESS NOTE    Brianna Porter  DGL:[id number] DOB: 1961-02-02 DOA: 03/04/2020 PCP: Cher Nakai, MD   Brief Narrative:  Patient is a 59 year old female with history of chronic A. fib on Eliquis, nonischemic cardiomyopathy, depression with anxiety, chronic anemia, hypertension who presented to the emergency department on 7/17 with bilateral lower extremity paresthesias, right leg weakness, intermittent limb jerking,suspected  convulsions.  CT head, MRI done in the emergency room was negative for acute intracranial abnormalities.  EEG did not show any seizure activity.  She was found to have severe hypothyroidism after admission and started on Synthyroid.  Also found to have adrenal insufficiency and started on steroids.  Patient seen by PT/OT and recommended skilled nursing facility.  Patient is medically stable for discharge to skilled nursing facility as soon as bed is available.   Assessment & Plan:   Principal Problem:   Encephalopathy Active Problems:   Type 2 diabetes mellitus with complication, without long-term current use of insulin (HCC)   Anemia   CKD (chronic kidney disease) stage 3, GFR 30-59 ml/min   Persistent atrial fibrillation (HCC)   Essential hypertension   CAD (coronary artery disease)   NICM (nonischemic cardiomyopathy) (HCC)   Elevated liver function tests   Convulsions (HCC)   Severe hypothyroidism: TSH was elevated at 74.  Amiodarone was stopped.  Free T4 was also very low.  No finding of hypothermia, hypoglycemia.  Continue Synthyroid 100 mcg daily.  Plan to recheck TSH in 4 to 6 weeks.  Adrenal insufficiency: Random serum cortisol was low.  On Cortef 2 mg twice a day.  She needs to follow-up with endocrinology as an outpatient.  Paresthesia/weakness/intermittent jerking: MRI of the brain did not show any acute intracranial abnormality.  EEG denies any seizures.  MRI of the cervical/thoracic/lumbar spine revealed only mild lumbar spondylosis with no acute  finding.  Normal folic acid, vitamin J18 level.  Normal ammonia level.  UDS unrevealing.  Stopped Neurontin as recommended by neurology.  Symptoms have improved.  Chronic A. fib: On Eliquis.  Currently rate is controlled.  Stopped amiodarone due to prolonged QTC cardiology recommended not  to start on amiodarone in the future given her hypothyroidism.  Continue Toprol for rate control.  QTC prolongation: QTC on June 16 was 550.  Repeat EKG showed QTC normalized to 437 ms  Nonischemic cardiomyopathy/systolic congestive heart failure: Ejection fraction of 20 to 25% as per September 2020 but repeat echo showed improved ejection fraction to 40 to 45%.  Currently euvolemic.  On losartan, Lasix  Elevated transaminases: Hepatitis panel negative.  Ultrasound of the liver showed fatty changes.  Hypertension: Consistently hypertensive.Monitor blood pressure.  Continue current medications. Added amlodipine  CKD stage IIIb/anemia of chronic disease: Currently kidney function at baseline.  Depression/anxiety: Currently mood is stable.  Left shoulder pain: Underwent IR guided arthrocentesis on 03/14/20.  We will follow-up fluid analysis.  She will follow-up with orthopedics as an outpatient.  MRI was consistent with chronic rotator cuff arthropathy.  Also received steroid injection.  Right knee deformity: Continue supportive care.  Imaging showed mild degenerative changes.  Follow-up with orthopedics as an outpatient  Numerous skin wounds/blisters/ecchymosis: Continue supportive care, wound care.  Weakness/deconditioning: PT/OT recommended skilled nursing facility.             DVT prophylaxis: Eliquis Code Status: Full Family Communication: None present at the bedside  status is: Inpatient  Remains inpatient appropriate because:Unsafe d/c plan   Dispo:  Patient From: Home  Planned Disposition: [place]  Expected discharge date: 03/16/20  Medically stable for discharge:  yes    Consultants: Orthopedics, cardiology, neurology  Procedures: Arthrocentesis  Antimicrobials:  Anti-infectives (From admission, onward)   None      Subjective:  Patient seen and examined at the bedside this morning.  Hemodynamically stable.  Comfortable.  Sitting on the chair.  Denies any complaints  Objective: Vitals:   03/15/20 0347 03/15/20 0458 03/15/20 0545 03/15/20 0749  BP: (!) 165/65 (!) 185/86  (!) 163/73  Pulse: 65 67  81  Resp: 18   16  Temp: 97.7 F (36.5 C)   98 F (36.7 C)  TempSrc: Oral   Oral  SpO2: 99%   98%  Weight:   (!) 95.3 kg   Height:        Intake/Output Summary (Last 24 hours) at 03/15/2020 0837 Last data filed at 03/15/2020 8119 Gross per 24 hour  Intake --  Output 2052 ml  Net -2052 ml   Filed Weights   03/08/20 0500 03/12/20 0400 03/15/20 0545  Weight: 72.3 kg (!) 102.5 kg (!) 95.3 kg    Examination:   General exam: Appears calm and comfortable ,Not in distress,aobese HEENT:PERRL,Oral mucosa moist, Ear/Nose normal on gross exam Respiratory system: Bilateral equal air entry, normal vesicular breath sounds, no wheezes or crackles  Cardiovascular system: irregularly  irregular rhythm, no JVD, murmurs, rubs, gallops or clicks. Gastrointestinal system: Abdomen is nondistended, soft and nontender. No organomegaly or masses felt. Normal bowel sounds heard. Central nervous system: Alert and oriented.  Extremities: No edema, no clubbing ,no cyanosis Skin: Numerous skin breakdowns, ecchymosis    Data Reviewed: I have personally reviewed following labs and imaging studies  CBC: No results for input(s): WBC, NEUTROABS, HGB, HCT, MCV, PLT in the last 168 hours. Basic Metabolic Panel: Recent Labs  Lab 03/09/20 0423 03/10/20 0541  NA 131* 133*  K 4.5 4.5  CL 98 100  CO2 24 26  GLUCOSE 194* 134*  BUN 11 14  CREATININE 1.62* 1.30*  CALCIUM 9.4 9.1  MG 2.0  --   PHOS 2.7  --    GFR: Estimated Creatinine Clearance: 50.8  mL/min (A) (by C-G formula based on SCr of 1.3 mg/dL (H)). Liver Function Tests: Recent Labs  Lab 03/09/20 0423 03/10/20 0541  AST 59* 55*  ALT 86* 81*  ALKPHOS 102 86  BILITOT 0.8 0.8  PROT 6.2* 5.6*  ALBUMIN 3.2* 2.9*   No results for input(s): LIPASE, AMYLASE in the last 168 hours. No results for input(s): AMMONIA in the last 168 hours. Coagulation Profile: No results for input(s): INR, PROTIME in the last 168 hours. Cardiac Enzymes: No results for input(s): CKTOTAL, CKMB, CKMBINDEX, TROPONINI in the last 168 hours. BNP (last 3 results) Recent Labs    04/23/19 1233 06/12/19 1705 10/16/19 1020  PROBNP 3,353* 12,215* 449*   HbA1C: No results for input(s): HGBA1C in the last 72 hours. CBG: No results for input(s): GLUCAP in the last 168 hours. Lipid Profile: No results for input(s): CHOL, HDL, LDLCALC, TRIG, CHOLHDL, LDLDIRECT in the last 72 hours. Thyroid Function Tests: No results for input(s): TSH, T4TOTAL, FREET4, T3FREE, THYROIDAB in the last 72 hours. Anemia Panel: No results for input(s): VITAMINB12, FOLATE, FERRITIN, TIBC, IRON, RETICCTPCT in the last 72 hours. Sepsis Labs: No results for input(s): PROCALCITON, LATICACIDVEN in the last 168 hours.  Recent Results (from the past 240 hour(s))  Culture, blood (routine x 2)     Status: None (Preliminary result)   Collection Time:  03/10/20 10:47 AM   Specimen: BLOOD LEFT HAND  Result Value Ref Range Status   Specimen Description BLOOD LEFT HAND  Final   Special Requests   Final    BOTTLES DRAWN AEROBIC ONLY Blood Culture results may not be optimal due to an inadequate volume of blood received in culture bottles   Culture   Final    NO GROWTH 4 DAYS Performed at [facility], [place] [address] [place], [place] [address]    Report Status PENDING  Incomplete  Culture, blood (routine x 2)     Status: None (Preliminary result)   Collection Time: 03/10/20 10:50 AM   Specimen: BLOOD RIGHT HAND  Result Value Ref  Range Status   Specimen Description BLOOD RIGHT HAND  Final   Special Requests   Final    BOTTLES DRAWN AEROBIC ONLY Blood Culture adequate volume   Culture   Final    NO GROWTH 4 DAYS Performed at [facility], [place] [address] [place], [place] [address]    Report Status PENDING  Incomplete  SARS Coronavirus 2 by RT PCR (hospital order, performed in [facility] hospital lab) Nasopharyngeal Nasopharyngeal Swab     Status: None   Collection Time: 03/13/20  1:12 PM   Specimen: Nasopharyngeal Swab  Result Value Ref Range Status   SARS Coronavirus 2 NEGATIVE NEGATIVE Final    Comment: (NOTE) SARS-CoV-2 target nucleic acids are NOT DETECTED.  The SARS-CoV-2 RNA is generally detectable in upper and lower respiratory specimens during the acute phase of infection. The lowest concentration of SARS-CoV-2 viral copies this assay can detect is 250 copies / mL. A negative result does not preclude SARS-CoV-2 infection and should not be used as the sole basis for treatment or other patient management decisions.  A negative result may occur with improper specimen collection / handling, submission of specimen other than nasopharyngeal swab, presence of viral mutation(s) within the areas targeted by this assay, and inadequate number of viral copies (<250 copies / mL). A negative result must be combined with clinical observations, patient history, and epidemiological information.  Fact Sheet for Patients:   StrictlyIdeas.no  Fact Sheet for Healthcare Providers: BankingDealers.co.za  This test is not yet approved or  cleared by the [place] FDA and has been authorized for detection and/or diagnosis of SARS-CoV-2 by FDA under an Emergency Use Authorization (EUA).  This EUA will remain in effect (meaning this test can be used) for the duration of the COVID-19 declaration under Section 564(b)(1) of the Act, 21 U.S.C. section 360bbb-3(b)(1),  unless the authorization is terminated or revoked sooner.  Performed at [facility], [place] [address] [place], [place] [address]          Radiology Studies: DG FLUORO GUIDED NEEDLE [facility] ASPIRATION/INJECTION LOC  Result Date: 03/14/2020 CLINICAL DATA:  59 year old female with history of left-sided shoulder pain. EXAM: LEFT SHOULDER INJECTION UNDER FLUOROSCOPY TECHNIQUE: An appropriate skin entrance site was determined. The site was marked, prepped with Betadine, draped in the usual sterile fashion, and infiltrated locally with buffered Lidocaine. 22 gauge spinal needle was advanced to the superomedial margin of the humeral head under intermittent fluoroscopy. No immediate complication. FLUOROSCOPY TIME:  Fluoroscopy Time:  42 seconds Radiation Exposure Index (if provided by the fluoroscopic device): 2.1 mGy FINDINGS: After placement of the needle, multiple attempts were made to aspirate fluid from the joint space, which were unsuccessful. Subsequently, a small amount of Omnipaque 180 was injected into the joint space  confirming proper placement of the needle. This contrast medium was aspirated as much as possible, and subsequently the joint was infused with 80 mg of Depo-Medrol diluted in 5 mL of bupivacaine. IMPRESSION: Technically successful left shoulder injection for MRI. Electronically Signed   By: Vinnie Langton M.D.   On: 03/14/2020 14:34        Scheduled Meds: . amLODipine  10 mg Oral Daily  . apixaban  5 mg Oral BID  . bupivacaine (PF)  30 mL Intra-articular Once  . DULoxetine  60 mg Oral BID  . fluticasone  1 spray Each Nare Daily  . furosemide  40 mg Oral Daily  . guaiFENesin  600 mg Oral BID  . hydrocortisone  5 mg Oral BID  . levothyroxine  100 mcg Oral Q0600  . metoprolol succinate  100 mg Oral Daily  . nicotine  21 mg Transdermal Daily  . pantoprazole  40 mg Oral BID  . senna-docusate  1 tablet Oral BID  . sodium chloride flush  3 mL Intravenous Q12H    Continuous Infusions:   LOS: 9 days    Time spent: 35 mins,More than 50% of that time was spent in counseling and/or coordination of care.      Shelly Coss, MD [facility] P7/27/2021, 8:37 AM

## 2020-03-15 NOTE — Progress Notes (Signed)
Occupational Therapy Treatment Patient Details Name: Brianna Porter MRN: [id number] DOB: [birth year] Today's Date: 03/15/2020    History of present illness Pt is a 59 y/o female admitted secondary to convulsions, weakness and numbness. MRI of the brain, cervical spine, thoracic spine and lumbar spine were all negative for any acute findings. An EEG was done and results were WNL. Further neuro workup pending. PMH including but not limited to atrial fibrillation on Eliquis, nonischemic cardiomyopathy, depression with anxiety, chronic anemia, hypertension.    OT comments  Patient continues to make steady progress towards goals in skilled OT session. Patient's session encompassed ADLs in sitting, and sit <>stand transfer with RW in order to increase overall activity tolerance. Pt with marked increase in motivation and affect to date, stating she is feeling much better and ready to get stronger. Pt still remains limited in sit<>stand tolerance, requiring external support at hips to advance one leg out of base of support and back. Discharge remains appropriate in order to address functional deficits; will continue to follow acutely.    Follow Up Recommendations  SNF;Supervision/Assistance - 24 hour    Equipment Recommendations  Other (comment);3 in 1 bedside commode Harrel Lemon)    Recommendations for Other Services      Precautions / Restrictions Precautions Precautions: Fall Precaution Comments: very fragile skin Restrictions Weight Bearing Restrictions: No       Mobility Bed Mobility Overal bed mobility: Needs Assistance Bed Mobility: Supine to Sit;Rolling Rolling: Modified independent (Device/Increase time)   Supine to sit: Min assist     General bed mobility comments: Up in char upon arrival  Transfers Overall transfer level: Needs assistance Equipment used: Rolling walker (2 wheeled) Transfers: Sit to/from Stand Sit to Stand: Min assist;From elevated surface         General  transfer comment: Completed sit<>stands from chair, working on advanced the L and R lower extremity forward and back to standing position, increased input required at hips to prevent buckling    Balance Overall balance assessment: Needs assistance Sitting-balance support: Feet unsupported;No upper extremity supported Sitting balance-Leahy Scale: Fair     Standing balance support: Bilateral upper extremity supported Standing balance-Leahy Scale: Poor Standing balance comment: bil UE support on RW; stood max 45 seconds                           ADL either performed or assessed with clinical judgement   ADL Overall ADL's : Needs assistance/impaired Eating/Feeding: Set up;Sitting   Grooming: Oral care;Wash/dry face;Wash/dry hands;Set up;Sitting Grooming Details (indicate cue type and reason): Sitting in recliner to complete                             Functional mobility during ADLs: Moderate assistance;+2 for safety/equipment General ADL Comments: Pt continues to demonstrate decreased activity tolerance, session focus on sit<>stands and ADLs in sitting     Vision       Perception     Praxis      Cognition Arousal/Alertness: Awake/alert Behavior During Therapy: WFL for tasks assessed/performed Overall Cognitive Status: Within Functional Limits for tasks assessed                                 General Comments: not specifically         Exercises     Shoulder Instructions  General Comments      Pertinent Vitals/ Pain       Pain Assessment: Faces Faces Pain Scale: Hurts a little bit Pain Location: L shoulder Pain Descriptors / Indicators: Guarding;Grimacing;Moaning Pain Intervention(s): Limited activity within patient's tolerance;Monitored during session;Repositioned  Home Living                                          Prior Functioning/Environment              Frequency  Min 2X/week         Progress Toward Goals  OT Goals(current goals can now be found in the care plan section)  Progress towards OT goals: Progressing toward goals  Acute Rehab OT Goals Patient Stated Goal: to get stronger OT Goal Formulation: With patient Time For Goal Achievement: 03/19/20 Potential to Achieve Goals: Good  Plan Discharge plan remains appropriate;Frequency remains appropriate    Co-evaluation                 AM-PAC OT "6 Clicks" Daily Activity     Outcome Measure   Help from another person eating meals?: A Little Help from another person taking care of personal grooming?: A Little Help from another person toileting, which includes using toliet, bedpan, or urinal?: A Lot Help from another person bathing (including washing, rinsing, drying)?: A Lot Help from another person to put on and taking off regular upper body clothing?: A Lot Help from another person to put on and taking off regular lower body clothing?: Total 6 Click Score: 13    End of Session Equipment Utilized During Treatment: Gait belt;Rolling walker  OT Visit Diagnosis: Unsteadiness on feet (R26.81);Other abnormalities of gait and mobility (R26.89);Muscle weakness (generalized) (M62.81);Other symptoms and signs involving cognitive function;Other symptoms and signs involving the nervous system (R29.898)   Activity Tolerance Patient tolerated treatment well   Patient Left in chair;with call bell/phone within reach;with chair alarm set   Nurse Communication Mobility status;Other (comment) (Stedy back to bed)        Time: 4585-9292 OT Time Calculation (min): 23 min  Charges: OT General Charges $OT Visit: 1 Visit OT Treatments $Self Care/Home Management : 23-37 mins  [place]. Alfonse Garringer, COTA/L Acute Rehabilitation Services [place] 03/15/2020, 10:03 AM

## 2020-03-15 NOTE — Progress Notes (Signed)
Pt refused morning labs and recheck of BP after 1x dose anti-hypertensive med. Lab to come back after this morning to try again.

## 2020-03-15 NOTE — Plan of Care (Signed)

## 2020-03-15 NOTE — Progress Notes (Signed)
Physical Therapy Treatment Patient Details Name: Brianna Porter MRN: [id number] DOB: 06-Oct-1960 Today's Date: 03/15/2020    History of Present Illness Pt is a 59 y/o female admitted secondary to convulsions, weakness and numbness. MRI of the brain, cervical spine, thoracic spine and lumbar spine were all negative for any acute findings. An EEG was done and results were WNL. Further neuro workup pending. PMH including but not limited to atrial fibrillation on Eliquis, nonischemic cardiomyopathy, depression with anxiety, chronic anemia, hypertension.     PT Comments    Patient eager to get OOB and get stronger. "When am I moving to the new place?" States disappointment with amount of therapy she receives in the hospital. Did much better today (post-shoulder aspiration and injection). Tolerated standing for up to 1 minute with RW and min assist. Tendency for posterior balance and incr LOB with pre-gait activities.    Follow Up Recommendations  SNF     Equipment Recommendations  Other (comment) (TBD)    Recommendations for Other Services       Precautions / Restrictions Precautions Precautions: Fall Precaution Comments: very fragile skin    Mobility  Bed Mobility Overal bed mobility: Needs Assistance Bed Mobility: Supine to Sit;Rolling Rolling: Modified independent (Device/Increase time)   Supine to sit: Min assist     General bed mobility comments: rolling with rail on max-inflated air mattress  Transfers Overall transfer level: Needs assistance Equipment used: Rolling walker (2 wheeled) Transfers: Sit to/from Stand Sit to Stand: Min assist;From elevated surface         General transfer comment: from EOB (very tall due to air mattress) to [place] with min assist; transffer to chair; from chair to RW x 4 with min assist  Ambulation/Gait             General Gait Details: pre-gait in standing, wt-shifting, toe tapping (alternating), heel raises (alternating),  marching (RLE adducts and externally rotates when it clears the floor)   Stairs             Wheelchair Mobility    Modified Rankin (Stroke Patients Only)       Balance Overall balance assessment: Needs assistance Sitting-balance support: Feet unsupported;No upper extremity supported Sitting balance-Leahy Scale: Fair     Standing balance support: Bilateral upper extremity supported Standing balance-Leahy Scale: Poor Standing balance comment: bil UE support on RW; stood max 60 seconds                            Cognition Arousal/Alertness: Awake/alert Behavior During Therapy: Flat affect (Frustrated with situation and current status) Overall Cognitive Status: Within Functional Limits for tasks assessed                                 General Comments: not specifically       Exercises      General Comments        Pertinent Vitals/Pain Pain Assessment: Faces Faces Pain Scale: Hurts a little bit Pain Location: L shoulder Pain Descriptors / Indicators: Guarding;Grimacing;Moaning Pain Intervention(s): Limited activity within patient's tolerance    Home Living                      Prior Function            PT Goals (current goals can now be found in the care plan section) Acute Rehab PT Goals  Patient Stated Goal: to get stronger Time For Goal Achievement: 03/19/20 Potential to Achieve Goals: Fair Progress towards PT goals: Progressing toward goals    Frequency    Min 2X/week      PT Plan Current plan remains appropriate    Co-evaluation              AM-PAC PT "6 Clicks" Mobility   Outcome Measure  Help needed turning from your back to your side while in a flat bed without using bedrails?: A Little Help needed moving from lying on your back to sitting on the side of a flat bed without using bedrails?: A Little Help needed moving to and from a bed to a chair (including a wheelchair)?: A Lot Help needed  standing up from a chair using your arms (e.g., wheelchair or bedside chair)?: A Little Help needed to walk in hospital room?: Total Help needed climbing 3-5 steps with a railing? : Total 6 Click Score: 13    End of Session Equipment Utilized During Treatment: Gait belt Activity Tolerance: Patient limited by fatigue Patient left: in chair;with call bell/phone within reach;with chair alarm set Nurse Communication: Mobility status (?need maximove back to bed due to bed high/pt short) PT Visit Diagnosis: Other abnormalities of gait and mobility (R26.89);Muscle weakness (generalized) (M62.81)     Time: 2162-4469 PT Time Calculation (min) (ACUTE ONLY): 36 min  Charges:  $Gait Training: 23-37 mins                      Arby Barrette, PT Pager [phone]    Rexanne Mano 03/15/2020, 9:09 AM

## 2020-03-16 DIAGNOSIS — D509 Iron deficiency anemia, unspecified: Secondary | ICD-10-CM | POA: Diagnosis not present

## 2020-03-16 NOTE — Progress Notes (Signed)
Pt refuses wound care this shift.

## 2020-03-16 NOTE — Progress Notes (Signed)
PROGRESS NOTE    Brianna Porter  PTW:[id number] DOB: 03-07-1961 DOA: 03/04/2020 PCP: Cher Nakai, MD   Brief Narrative:  Patient is a 59 year old female with history of chronic A. fib on Eliquis, nonischemic cardiomyopathy, depression with anxiety, chronic anemia, hypertension who presented to the emergency department on 7/17 with bilateral lower extremity paresthesias, right leg weakness, intermittent limb jerking,suspected  convulsions.  CT head, MRI done in the emergency room was negative for acute intracranial abnormalities.  EEG did not show any seizure activity.  She was found to have severe hypothyroidism after admission and started on Synthyroid.  Also found to have adrenal insufficiency and started on steroids.  Patient seen by PT/OT and recommended skilled nursing facility.  Patient is medically stable for discharge to skilled nursing facility as soon as bed is available.   Assessment & Plan:   Principal Problem:   Encephalopathy Active Problems:   Type 2 diabetes mellitus with complication, without long-term current use of insulin (HCC)   Anemia   CKD (chronic kidney disease) stage 3, GFR 30-59 ml/min   Persistent atrial fibrillation (HCC)   Essential hypertension   CAD (coronary artery disease)   NICM (nonischemic cardiomyopathy) (HCC)   Elevated liver function tests   Convulsions (HCC)   Severe hypothyroidism: TSH was elevated at 74.  Amiodarone was stopped.  Free T4 was also very low.  No finding of hypothermia, hypoglycemia.  Continue Synthyroid 100 mcg daily.  Plan to recheck TSH in 4 to 6 weeks.  Adrenal insufficiency: Random serum cortisol was low.  On Cortef 2 mg twice a day.  She needs to follow-up with endocrinology as an outpatient.  Paresthesia/weakness/intermittent jerking: MRI of the brain did not show any acute intracranial abnormality.  EEG denies any seizures.  MRI of the cervical/thoracic/lumbar spine revealed only mild lumbar spondylosis with no acute  finding.  Normal folic acid, vitamin Z00 level.  Normal ammonia level.  UDS unrevealing.  Stopped Neurontin as recommended by neurology.  Symptoms have improved.  Chronic A. fib: On Eliquis.  Currently rate is controlled.  Stopped amiodarone due to prolonged QTC cardiology recommended not  to start on amiodarone in the future given her hypothyroidism.  Continue Toprol for rate control.  QTC prolongation: QTC on June 16 was 550.  Repeat EKG showed QTC normalized to 437 ms  Nonischemic cardiomyopathy/systolic congestive heart failure: Ejection fraction of 20 to 25% as per September 2020 but repeat echo showed improved ejection fraction to 40 to 45%.  Currently euvolemic.  On losartan, Lasix  Elevated transaminases: Hepatitis panel negative.  Ultrasound of the liver showed fatty changes.  Hypertension: Consistently hypertensive.Monitor blood pressure.  Continue current medications. Added amlodipine  CKD stage IIIb/anemia of chronic disease: Currently kidney function at baseline.  Depression/anxiety: Currently mood is stable.  Left shoulder pain: Underwent IR guided arthrocentesis on 03/14/20.  We will follow-up fluid analysis.  She will follow-up with orthopedics as an outpatient.  MRI was consistent with chronic rotator cuff arthropathy.  Also received steroid injection.  Right knee deformity: Continue supportive care.  Imaging showed mild degenerative changes.  Follow-up with orthopedics as an outpatient  Numerous skin wounds/blisters/ecchymosis: Continue supportive care, wound care.  Weakness/deconditioning: PT/OT recommended skilled nursing facility.             DVT prophylaxis: Eliquis Code Status: Full Family Communication: None present at the bedside  status is: Inpatient  Remains inpatient appropriate because:Unsafe d/c plan   Dispo:  Patient From: Home  Planned Disposition: [facility]  Expected discharge date: As soon as bed is available at  SNF  Medically stable for discharge: yes    Consultants: Orthopedics, cardiology, neurology  Procedures: Arthrocentesis  Antimicrobials:  Anti-infectives (From admission, onward)   None      Subjective:  Patient seen and examined at the bedside this morning.  Hemodynamically stable.  Comfortable.  No active issues.  Waiting for bed Objective: Vitals:   03/16/20 0340 03/16/20 0343 03/16/20 0404 03/16/20 0752  BP: (!) 158/69 (!) 127/58  (!) 118/57  Pulse: 71 70  61  Resp: 16 16  18   Temp: 97.8 F (36.6 C) 97.7 F (36.5 C)  98.5 F (36.9 C)  TempSrc: Oral Oral  Axillary  SpO2: 98% 98%  94%  Weight:   (!) 95.3 kg   Height:       No intake or output data in the 24 hours ending 03/16/20 0807 Filed Weights   03/12/20 0400 03/15/20 0545 03/16/20 0404  Weight: (!) 102.5 kg (!) 95.3 kg (!) 95.3 kg    Examination:  General exam: Appears calm and comfortable ,Not in distress,,obese Respiratory system: Bilateral equal air entry, normal vesicular breath sounds, no wheezes or crackles  Cardiovascular system: Irregularly irregular rhythm, no JVD, murmurs, rubs, gallops or clicks. Gastrointestinal system: Abdomen is nondistended, soft and nontender. No organomegaly or masses felt. Normal bowel sounds heard. Central nervous system: Alert and oriented. No focal neurological deficits. Extremities: No edema, no clubbing ,no cyanosis, distal peripheral pulses palpable. Skin: Numerous skin breakdowns, ecchymosis    Data Reviewed: I have personally reviewed following labs and imaging studies  CBC: Recent Labs  Lab 03/15/20 0940  WBC 12.4*  NEUTROABS 9.7*  HGB 11.0*  HCT 34.9*  MCV 98.0  PLT 850*   Basic Metabolic Panel: Recent Labs  Lab 03/10/20 0541 03/15/20 0940  NA 133* 132*  K 4.5 4.4  CL 100 97*  CO2 26 23  GLUCOSE 134* 220*  BUN 14 17  CREATININE 1.30* 1.17*  CALCIUM 9.1 9.6   GFR: Estimated Creatinine Clearance: 56.4 mL/min (A) (by C-G formula based on  SCr of 1.17 mg/dL (H)). Liver Function Tests: Recent Labs  Lab 03/10/20 0541  AST 55*  ALT 81*  ALKPHOS 86  BILITOT 0.8  PROT 5.6*  ALBUMIN 2.9*   No results for input(s): LIPASE, AMYLASE in the last 168 hours. No results for input(s): AMMONIA in the last 168 hours. Coagulation Profile: No results for input(s): INR, PROTIME in the last 168 hours. Cardiac Enzymes: No results for input(s): CKTOTAL, CKMB, CKMBINDEX, TROPONINI in the last 168 hours. BNP (last 3 results) Recent Labs    04/23/19 1233 06/12/19 1705 10/16/19 1020  PROBNP 3,353* 12,215* 449*   HbA1C: No results for input(s): HGBA1C in the last 72 hours. CBG: No results for input(s): GLUCAP in the last 168 hours. Lipid Profile: No results for input(s): CHOL, HDL, LDLCALC, TRIG, CHOLHDL, LDLDIRECT in the last 72 hours. Thyroid Function Tests: No results for input(s): TSH, T4TOTAL, FREET4, T3FREE, THYROIDAB in the last 72 hours. Anemia Panel: No results for input(s): VITAMINB12, FOLATE, FERRITIN, TIBC, IRON, RETICCTPCT in the last 72 hours. Sepsis Labs: No results for input(s): PROCALCITON, LATICACIDVEN in the last 168 hours.  Recent Results (from the past 240 hour(s))  Culture, blood (routine x 2)     Status: None   Collection Time: 03/10/20 10:47 AM   Specimen: BLOOD LEFT HAND  Result Value Ref Range Status   Specimen Description BLOOD LEFT HAND  Final  Special Requests   Final    BOTTLES DRAWN AEROBIC ONLY Blood Culture results may not be optimal due to an inadequate volume of blood received in culture bottles   Culture   Final    NO GROWTH 5 DAYS Performed at [facility], [place] [address] [place], [place] [address]    Report Status 03/15/2020 FINAL  Final  Culture, blood (routine x 2)     Status: None   Collection Time: 03/10/20 10:50 AM   Specimen: BLOOD RIGHT HAND  Result Value Ref Range Status   Specimen Description BLOOD RIGHT HAND  Final   Special Requests   Final    BOTTLES DRAWN AEROBIC  ONLY Blood Culture adequate volume   Culture   Final    NO GROWTH 5 DAYS Performed at [facility], [place] [address], [place], [place] [address]    Report Status 03/15/2020 FINAL  Final  SARS Coronavirus 2 by RT PCR (hospital order, performed in [facility] hospital lab) Nasopharyngeal Nasopharyngeal Swab     Status: None   Collection Time: 03/13/20  1:12 PM   Specimen: Nasopharyngeal Swab  Result Value Ref Range Status   SARS Coronavirus 2 NEGATIVE NEGATIVE Final    Comment: (NOTE) SARS-CoV-2 target nucleic acids are NOT DETECTED.  The SARS-CoV-2 RNA is generally detectable in upper and lower respiratory specimens during the acute phase of infection. The lowest concentration of SARS-CoV-2 viral copies this assay can detect is 250 copies / mL. A negative result does not preclude SARS-CoV-2 infection and should not be used as the sole basis for treatment or other patient management decisions.  A negative result may occur with improper specimen collection / handling, submission of specimen other than nasopharyngeal swab, presence of viral mutation(s) within the areas targeted by this assay, and inadequate number of viral copies (<250 copies / mL). A negative result must be combined with clinical observations, patient history, and epidemiological information.  Fact Sheet for Patients:   StrictlyIdeas.no  Fact Sheet for Healthcare Providers: BankingDealers.co.za  This test is not yet approved or  cleared by the [place] FDA and has been authorized for detection and/or diagnosis of SARS-CoV-2 by FDA under an Emergency Use Authorization (EUA).  This EUA will remain in effect (meaning this test can be used) for the duration of the COVID-19 declaration under Section 564(b)(1) of the Act, 21 U.S.C. section 360bbb-3(b)(1), unless the authorization is terminated or revoked sooner.  Performed at [facility], [place] [address], [place], [place] [address]          Radiology Studies: DG FLUORO GUIDED NEEDLE [facility] ASPIRATION/INJECTION LOC  Result Date: 03/14/2020 CLINICAL DATA:  59 year old female with history of left-sided shoulder pain. EXAM: LEFT SHOULDER INJECTION UNDER FLUOROSCOPY TECHNIQUE: An appropriate skin entrance site was determined. The site was marked, prepped with Betadine, draped in the usual sterile fashion, and infiltrated locally with buffered Lidocaine. 22 gauge spinal needle was advanced to the superomedial margin of the humeral head under intermittent fluoroscopy. No immediate complication. FLUOROSCOPY TIME:  Fluoroscopy Time:  42 seconds Radiation Exposure Index (if provided by the fluoroscopic device): 2.1 mGy FINDINGS: After placement of the needle, multiple attempts were made to aspirate fluid from the joint space, which were unsuccessful. Subsequently, a small amount of Omnipaque 180 was injected into the joint space confirming proper placement of the needle. This contrast medium was aspirated as much as possible, and subsequently the joint was infused with 80 mg of Depo-Medrol  diluted in 5 mL of bupivacaine. IMPRESSION: Technically successful left shoulder injection for MRI. Electronically Signed   By: Vinnie Langton M.D.   On: 03/14/2020 14:34        Scheduled Meds: . amLODipine  10 mg Oral Daily  . apixaban  5 mg Oral BID  . bupivacaine (PF)  30 mL Intra-articular Once  . DULoxetine  60 mg Oral BID  . fluticasone  1 spray Each Nare Daily  . furosemide  40 mg Oral Daily  . guaiFENesin  600 mg Oral BID  . hydrocortisone  5 mg Oral BID  . irbesartan  150 mg Oral Daily  . levothyroxine  100 mcg Oral Q0600  . metoprolol succinate  100 mg Oral Daily  . nicotine  21 mg Transdermal Daily  . pantoprazole  40 mg Oral BID  . senna-docusate  1 tablet Oral BID  . sodium chloride flush  3 mL Intravenous Q12H   Continuous Infusions:   LOS: 10 days    Time spent: 35 mins,More than 50% of  that time was spent in counseling and/or coordination of care.      Shelly Coss, MD [facility] P7/28/2021, 8:07 AM

## 2020-03-16 NOTE — Plan of Care (Signed)

## 2020-03-17 DIAGNOSIS — D509 Iron deficiency anemia, unspecified: Secondary | ICD-10-CM | POA: Diagnosis not present

## 2020-03-17 NOTE — Progress Notes (Signed)
Physical Therapy Treatment Patient Details Name: Brianna Porter MRN: [id number] DOB: [birth year] Today's Date: 03/17/2020    History of Present Illness Pt is a 59 y/o female admitted secondary to convulsions, weakness and numbness. MRI of the brain, cervical spine, thoracic spine and lumbar spine were all negative for any acute findings. An EEG was done and results were WNL. Further neuro workup pending. PMH including but not limited to atrial fibrillation on Eliquis, nonischemic cardiomyopathy, depression with anxiety, chronic anemia, hypertension.     PT Comments    Pt is making slow but steady progress towards goals. Provided pt with HEP handout of today's exercised so she can continue to progress her LE strengthening. Pt is very motivated, but currently limited by LE weakness, R>L. She was able to progress to marching in place but can only tolerate 4 steps before requiring seated rest break. Patient would benefit from continued skilled PT to maximize functional independence and activity tolerance. Will continue to follow acutely.     Follow Up Recommendations  SNF     Equipment Recommendations  Other (comment) (TBD)    Recommendations for Other Services       Precautions / Restrictions Precautions Precautions: Fall Precaution Comments: very fragile skin    Mobility  Bed Mobility Overal bed mobility: Needs Assistance             General bed mobility comments: up in chair on arrival  Transfers Overall transfer level: Needs assistance Equipment used: Rolling walker (2 wheeled) Transfers: Sit to/from Stand Sit to Stand: Mod assist;Min assist;+2 physical assistance         General transfer comment: Pt performed sit<>stand with varying degrees of assist. PTA used manual facilitation on R quad to prevent buckling.   Ambulation/Gait             General Gait Details: Attempted marching in place. Pt would preform 4 steps before needing to sit. 4 steps  x2   Stairs             Wheelchair Mobility    Modified Rankin (Stroke Patients Only)       Balance Overall balance assessment: Needs assistance Sitting-balance support: Feet unsupported;No upper extremity supported Sitting balance-Leahy Scale: Fair Sitting balance - Comments: prefers use of UE's, but able to maintain sitting on a solid surface without UE's or external support.  She cannot handle challenge.   Standing balance support: Bilateral upper extremity supported Standing balance-Leahy Scale: Poor Standing balance comment: bil UE support on RW; stood max 45 seconds                            Cognition Arousal/Alertness: Awake/alert Behavior During Therapy: WFL for tasks assessed/performed Overall Cognitive Status: Within Functional Limits for tasks assessed                                        Exercises General Exercises - Lower Extremity Ankle Circles/Pumps: AROM;Both;20 reps;Seated;AAROM (Foot drop on R LE, requiring assist) Long Arc Quad: AROM;Both;10 reps;Seated Heel Slides: AROM;Both;10 reps;Seated Hip ABduction/ADduction: AROM;Both;10 reps;Seated    General Comments        Pertinent Vitals/Pain Pain Assessment: Faces Faces Pain Scale: Hurts a little bit Pain Location: L shoulder Pain Descriptors / Indicators: Guarding;Grimacing;Moaning Pain Intervention(s): Monitored during session;Limited activity within patient's tolerance    Home Living  Prior Function            PT Goals (current goals can now be found in the care plan section) Acute Rehab PT Goals Patient Stated Goal: to get stronger PT Goal Formulation: With patient Time For Goal Achievement: 03/19/20 Potential to Achieve Goals: Fair Progress towards PT goals: Progressing toward goals    Frequency    Min 2X/week      PT Plan Current plan remains appropriate    Co-evaluation              AM-PAC PT "6  Clicks" Mobility   Outcome Measure  Help needed turning from your back to your side while in a flat bed without using bedrails?: A Little Help needed moving from lying on your back to sitting on the side of a flat bed without using bedrails?: A Little Help needed moving to and from a bed to a chair (including a wheelchair)?: A Lot Help needed standing up from a chair using your arms (e.g., wheelchair or bedside chair)?: A Little Help needed to walk in hospital room?: Total Help needed climbing 3-5 steps with a railing? : Total 6 Click Score: 13    End of Session Equipment Utilized During Treatment: Gait belt Activity Tolerance: Patient limited by fatigue Patient left: in chair;with call bell/phone within reach;with chair alarm set Nurse Communication: Mobility status PT Visit Diagnosis: Other abnormalities of gait and mobility (R26.89);Muscle weakness (generalized) (M62.81)     Time: 5146-0479 PT Time Calculation (min) (ACUTE ONLY): 43 min  Charges:  $Therapeutic Exercise: 23-37 mins $Therapeutic Activity: 8-22 mins                     Benjiman Core, [place] Pager [phone] Acute Rehab   Allena Katz 03/17/2020, 3:07 PM

## 2020-03-17 NOTE — Progress Notes (Signed)
PROGRESS NOTE    Brianna Porter  TSV:[id number] DOB: [birth year] DOA: 03/04/2020 PCP: Cher Nakai, MD   Brief Narrative:  Patient is a 59 year old female with history of chronic A. fib on Eliquis, nonischemic cardiomyopathy, depression with anxiety, chronic anemia, hypertension who presented to the emergency department on 7/17 with bilateral lower extremity paresthesias, right leg weakness, intermittent limb jerking,suspected  convulsions.  CT head, MRI done in the emergency room was negative for acute intracranial abnormalities.  EEG did not show any seizure activity.  She was found to have severe hypothyroidism after admission and started on Synthyroid.  Also found to have adrenal insufficiency and started on steroids.  Patient seen by PT/OT and recommended skilled nursing facility.  Patient is medically stable for discharge to skilled nursing facility as soon as bed is available.   Assessment & Plan:   Principal Problem:   Encephalopathy Active Problems:   Type 2 diabetes mellitus with complication, without long-term current use of insulin (HCC)   Anemia   CKD (chronic kidney disease) stage 3, GFR 30-59 ml/min   Persistent atrial fibrillation (HCC)   Essential hypertension   CAD (coronary artery disease)   NICM (nonischemic cardiomyopathy) (HCC)   Elevated liver function tests   Convulsions (HCC)   Severe hypothyroidism: TSH was elevated at 74.  Amiodarone was stopped.  Free T4 was also very low.  No finding of hypothermia, hypoglycemia.  Continue Synthyroid 100 mcg daily.  Plan to recheck TSH in 4 to 6 weeks.  Adrenal insufficiency: Random serum cortisol was low.  On Cortef 2 mg twice a day.  She needs to follow-up with endocrinology as an outpatient.  Paresthesia/weakness/intermittent jerking: MRI of the brain did not show any acute intracranial abnormality.  EEG denies any seizures.  MRI of the cervical/thoracic/lumbar spine revealed only mild lumbar spondylosis with no acute  finding.  Normal folic acid, vitamin P23 level.  Normal ammonia level.  UDS unrevealing.  Stopped Neurontin as recommended by neurology.  She complained of severe tingling and numbness of her right lower extremity today.  If she continues to complain , we might consider restarting low-dose Neurontin.  Chronic A. fib: On Eliquis.  Currently rate is controlled.  Stopped amiodarone due to prolonged QTC cardiology recommended not  to start on amiodarone in the future given her hypothyroidism.  Continue Toprol for rate control.  QTC prolongation: QTC on June 16 was 550.  Repeat EKG showed QTC normalized to 437 ms  Nonischemic cardiomyopathy/systolic congestive heart failure: Ejection fraction of 20 to 25% as per September 2020 but repeat echo showed improved ejection fraction to 40 to 45%.  Currently euvolemic.  On losartan, Lasix  Elevated transaminases: Hepatitis panel negative.  Ultrasound of the liver showed fatty changes.  Hypertension: Consistently hypertensive.Monitor blood pressure.  Continue current medications. Added amlodipine  CKD stage IIIb/anemia of chronic disease: Currently kidney function at baseline.  Depression/anxiety: Currently mood is stable.  Left shoulder pain: Underwent IR guided arthrocentesis on 03/14/20.  We will follow-up fluid analysis.  She will follow-up with orthopedics as an outpatient.  MRI was consistent with chronic rotator cuff arthropathy.  Also received steroid injection.  Right knee deformity: Continue supportive care.  Imaging showed mild degenerative changes.  Follow-up with orthopedics as an outpatient  Numerous skin wounds/blisters/ecchymosis: Continue supportive care, wound care.  Weakness/deconditioning: PT/OT recommended skilled nursing facility.             DVT prophylaxis: Eliquis Code Status: Full Family Communication: None present at the bedside  status is: Inpatient  Remains inpatient appropriate because:Unsafe d/c  plan   Dispo:  Patient From: Home  Planned Disposition: [place]  Expected discharge date: As soon as bed is available at SNF  Medically stable for discharge: yes    Consultants: Orthopedics, cardiology, neurology  Procedures: Arthrocentesis  Antimicrobials:  Anti-infectives (From admission, onward)   None      Subjective:  Patient seen and examined at the bedside this morning.  Hemodynamically stable.  She complains of numbness and paresthesia of the right lower extremity.  Remains comfortable otherwise.    Objective: Vitals:   03/16/20 2330 03/17/20 0320 03/17/20 0750 03/17/20 1204  BP: (!) 130/64 (!) 132/58 (!) 153/66 (!) 116/60  Pulse: 60 64 64 58  Resp: 16 16 16 16   Temp: 97.9 F (36.6 C) 98 F (36.7 C) 97.6 F (36.4 C) 97.7 F (36.5 C)  TempSrc: Oral Oral Axillary Oral  SpO2: 100% 99% 100% 100%  Weight:      Height:        Intake/Output Summary (Last 24 hours) at 03/17/2020 1327 Last data filed at 03/16/2020 2300 Gross per 24 hour  Intake 480 ml  Output --  Net 480 ml   Filed Weights   03/12/20 0400 03/15/20 0545 03/16/20 0404  Weight: (!) 102.5 kg (!) 95.3 kg (!) 95.3 kg    Examination:  General exam: Appears calm and comfortable ,Not in distress,obese Respiratory system: Bilateral equal air entry, normal vesicular breath sounds, no wheezes or crackles  Cardiovascular system: Afib, No JVD, murmurs, rubs, gallops or clicks. Gastrointestinal system: Abdomen is nondistended, soft and nontender. No organomegaly or masses felt. Normal bowel sounds heard. Central nervous system: Alert and oriented. No focal neurological deficits. Extremities: No edema, no clubbing ,no cyanosis Skin: Ecchymosis, minor skin breakdowns  Data Reviewed: I have personally reviewed following labs and imaging studies  CBC: Recent Labs  Lab 03/15/20 0940  WBC 12.4*  NEUTROABS 9.7*  HGB 11.0*  HCT 34.9*  MCV 98.0  PLT 160*   Basic Metabolic  Panel: Recent Labs  Lab 03/15/20 0940  NA 132*  K 4.4  CL 97*  CO2 23  GLUCOSE 220*  BUN 17  CREATININE 1.17*  CALCIUM 9.6   GFR: Estimated Creatinine Clearance: 56.4 mL/min (A) (by C-G formula based on SCr of 1.17 mg/dL (H)). Liver Function Tests: No results for input(s): AST, ALT, ALKPHOS, BILITOT, PROT, ALBUMIN in the last 168 hours. No results for input(s): LIPASE, AMYLASE in the last 168 hours. No results for input(s): AMMONIA in the last 168 hours. Coagulation Profile: No results for input(s): INR, PROTIME in the last 168 hours. Cardiac Enzymes: No results for input(s): CKTOTAL, CKMB, CKMBINDEX, TROPONINI in the last 168 hours. BNP (last 3 results) Recent Labs    04/23/19 1233 06/12/19 1705 10/16/19 1020  PROBNP 3,353* 12,215* 449*   HbA1C: No results for input(s): HGBA1C in the last 72 hours. CBG: No results for input(s): GLUCAP in the last 168 hours. Lipid Profile: No results for input(s): CHOL, HDL, LDLCALC, TRIG, CHOLHDL, LDLDIRECT in the last 72 hours. Thyroid Function Tests: No results for input(s): TSH, T4TOTAL, FREET4, T3FREE, THYROIDAB in the last 72 hours. Anemia Panel: No results for input(s): VITAMINB12, FOLATE, FERRITIN, TIBC, IRON, RETICCTPCT in the last 72 hours. Sepsis Labs: No results for input(s): PROCALCITON, LATICACIDVEN in the last 168 hours.  Recent Results (from the past 240 hour(s))  Culture, blood (routine x 2)     Status: None   Collection  Time: 03/10/20 10:47 AM   Specimen: BLOOD LEFT HAND  Result Value Ref Range Status   Specimen Description BLOOD LEFT HAND  Final   Special Requests   Final    BOTTLES DRAWN AEROBIC ONLY Blood Culture results may not be optimal due to an inadequate volume of blood received in culture bottles   Culture   Final    NO GROWTH 5 DAYS Performed at [facility], [place] [address], [place], [place] [address]    Report Status 03/15/2020 FINAL  Final  Culture, blood (routine x 2)     Status: None    Collection Time: 03/10/20 10:50 AM   Specimen: BLOOD RIGHT HAND  Result Value Ref Range Status   Specimen Description BLOOD RIGHT HAND  Final   Special Requests   Final    BOTTLES DRAWN AEROBIC ONLY Blood Culture adequate volume   Culture   Final    NO GROWTH 5 DAYS Performed at [facility], [place] [address] [place], [place] [address]    Report Status 03/15/2020 FINAL  Final  SARS Coronavirus 2 by RT PCR (hospital order, performed in [facility] hospital lab) Nasopharyngeal Nasopharyngeal Swab     Status: None   Collection Time: 03/13/20  1:12 PM   Specimen: Nasopharyngeal Swab  Result Value Ref Range Status   SARS Coronavirus 2 NEGATIVE NEGATIVE Final    Comment: (NOTE) SARS-CoV-2 target nucleic acids are NOT DETECTED.  The SARS-CoV-2 RNA is generally detectable in upper and lower respiratory specimens during the acute phase of infection. The lowest concentration of SARS-CoV-2 viral copies this assay can detect is 250 copies / mL. A negative result does not preclude SARS-CoV-2 infection and should not be used as the sole basis for treatment or other patient management decisions.  A negative result may occur with improper specimen collection / handling, submission of specimen other than nasopharyngeal swab, presence of viral mutation(s) within the areas targeted by this assay, and inadequate number of viral copies (<250 copies / mL). A negative result must be combined with clinical observations, patient history, and epidemiological information.  Fact Sheet for Patients:   StrictlyIdeas.no  Fact Sheet for Healthcare Providers: BankingDealers.co.za  This test is not yet approved or  cleared by the [place] FDA and has been authorized for detection and/or diagnosis of SARS-CoV-2 by FDA under an Emergency Use Authorization (EUA).  This EUA will remain in effect (meaning this test can be used) for the duration of  the COVID-19 declaration under Section 564(b)(1) of the Act, 21 U.S.C. section 360bbb-3(b)(1), unless the authorization is terminated or revoked sooner.  Performed at [facility], [place] [address] [place], [place] [address]          Radiology Studies: No results found.      Scheduled Meds: . amLODipine  10 mg Oral Daily  . apixaban  5 mg Oral BID  . bupivacaine (PF)  30 mL Intra-articular Once  . DULoxetine  60 mg Oral BID  . fluticasone  1 spray Each Nare Daily  . furosemide  40 mg Oral Daily  . guaiFENesin  600 mg Oral BID  . hydrocortisone  5 mg Oral BID  . irbesartan  150 mg Oral Daily  . levothyroxine  100 mcg Oral Q0600  . metoprolol succinate  100 mg Oral Daily  . nicotine  21 mg Transdermal Daily  . pantoprazole  40 mg Oral BID  . senna-docusate  1 tablet Oral BID  .  sodium chloride flush  3 mL Intravenous Q12H   Continuous Infusions:   LOS: 11 days    Time spent: 35 mins,More than 50% of that time was spent in counseling and/or coordination of care.      Shelly Coss, MD [facility] P7/29/2021, 1:27 PM

## 2020-03-18 DIAGNOSIS — N189 Chronic kidney disease, unspecified: Secondary | ICD-10-CM | POA: Diagnosis not present

## 2020-03-18 DIAGNOSIS — I110 Hypertensive heart disease with heart failure: Secondary | ICD-10-CM | POA: Diagnosis not present

## 2020-03-18 DIAGNOSIS — E2740 Unspecified adrenocortical insufficiency: Secondary | ICD-10-CM | POA: Diagnosis not present

## 2020-03-18 DIAGNOSIS — M2550 Pain in unspecified joint: Secondary | ICD-10-CM | POA: Diagnosis not present

## 2020-03-18 DIAGNOSIS — N183 Chronic kidney disease, stage 3 unspecified: Secondary | ICD-10-CM | POA: Diagnosis not present

## 2020-03-18 DIAGNOSIS — I5022 Chronic systolic (congestive) heart failure: Secondary | ICD-10-CM | POA: Diagnosis not present

## 2020-03-18 DIAGNOSIS — Z716 Tobacco abuse counseling: Secondary | ICD-10-CM | POA: Diagnosis not present

## 2020-03-18 DIAGNOSIS — I5020 Unspecified systolic (congestive) heart failure: Secondary | ICD-10-CM | POA: Diagnosis not present

## 2020-03-18 DIAGNOSIS — Z79899 Other long term (current) drug therapy: Secondary | ICD-10-CM | POA: Diagnosis not present

## 2020-03-18 DIAGNOSIS — E039 Hypothyroidism, unspecified: Secondary | ICD-10-CM | POA: Diagnosis not present

## 2020-03-18 DIAGNOSIS — D649 Anemia, unspecified: Secondary | ICD-10-CM | POA: Diagnosis not present

## 2020-03-18 DIAGNOSIS — R202 Paresthesia of skin: Secondary | ICD-10-CM | POA: Diagnosis not present

## 2020-03-18 DIAGNOSIS — G9340 Encephalopathy, unspecified: Secondary | ICD-10-CM | POA: Diagnosis not present

## 2020-03-18 DIAGNOSIS — I2510 Atherosclerotic heart disease of native coronary artery without angina pectoris: Secondary | ICD-10-CM | POA: Diagnosis not present

## 2020-03-18 DIAGNOSIS — R5381 Other malaise: Secondary | ICD-10-CM | POA: Diagnosis not present

## 2020-03-18 DIAGNOSIS — R7989 Other specified abnormal findings of blood chemistry: Secondary | ICD-10-CM | POA: Diagnosis not present

## 2020-03-18 DIAGNOSIS — I482 Chronic atrial fibrillation, unspecified: Secondary | ICD-10-CM | POA: Diagnosis not present

## 2020-03-18 DIAGNOSIS — E1169 Type 2 diabetes mellitus with other specified complication: Secondary | ICD-10-CM | POA: Diagnosis not present

## 2020-03-18 DIAGNOSIS — Z7901 Long term (current) use of anticoagulants: Secondary | ICD-10-CM | POA: Diagnosis not present

## 2020-03-18 DIAGNOSIS — I10 Essential (primary) hypertension: Secondary | ICD-10-CM | POA: Diagnosis not present

## 2020-03-18 DIAGNOSIS — J309 Allergic rhinitis, unspecified: Secondary | ICD-10-CM | POA: Diagnosis not present

## 2020-03-18 DIAGNOSIS — I429 Cardiomyopathy, unspecified: Secondary | ICD-10-CM | POA: Diagnosis not present

## 2020-03-18 DIAGNOSIS — E875 Hyperkalemia: Secondary | ICD-10-CM | POA: Diagnosis not present

## 2020-03-18 DIAGNOSIS — D509 Iron deficiency anemia, unspecified: Secondary | ICD-10-CM | POA: Diagnosis not present

## 2020-03-18 DIAGNOSIS — R531 Weakness: Secondary | ICD-10-CM | POA: Diagnosis not present

## 2020-03-18 DIAGNOSIS — L02429 Furuncle of limb, unspecified: Secondary | ICD-10-CM | POA: Diagnosis not present

## 2020-03-18 DIAGNOSIS — I5042 Chronic combined systolic (congestive) and diastolic (congestive) heart failure: Secondary | ICD-10-CM | POA: Diagnosis not present

## 2020-03-18 DIAGNOSIS — R9431 Abnormal electrocardiogram [ECG] [EKG]: Secondary | ICD-10-CM | POA: Diagnosis not present

## 2020-03-18 DIAGNOSIS — E7800 Pure hypercholesterolemia, unspecified: Secondary | ICD-10-CM | POA: Diagnosis not present

## 2020-03-18 DIAGNOSIS — I4892 Unspecified atrial flutter: Secondary | ICD-10-CM | POA: Diagnosis not present

## 2020-03-18 DIAGNOSIS — K922 Gastrointestinal hemorrhage, unspecified: Secondary | ICD-10-CM | POA: Diagnosis not present

## 2020-03-18 DIAGNOSIS — R69 Illness, unspecified: Secondary | ICD-10-CM | POA: Diagnosis not present

## 2020-03-18 DIAGNOSIS — I4819 Other persistent atrial fibrillation: Secondary | ICD-10-CM | POA: Diagnosis not present

## 2020-03-18 DIAGNOSIS — Z7401 Bed confinement status: Secondary | ICD-10-CM | POA: Diagnosis not present

## 2020-03-18 DIAGNOSIS — R569 Unspecified convulsions: Secondary | ICD-10-CM | POA: Diagnosis not present

## 2020-03-18 DIAGNOSIS — G8929 Other chronic pain: Secondary | ICD-10-CM | POA: Diagnosis not present

## 2020-03-18 LAB — CBC WITH DIFFERENTIAL/PLATELET
Abs Immature Granulocytes: 0.71 10*3/uL — ABNORMAL HIGH (ref 0.00–0.07)
Basophils Absolute: 0.1 10*3/uL (ref 0.0–0.1)
Basophils Relative: 1 %
Eosinophils Absolute: 0.1 10*3/uL (ref 0.0–0.5)
Eosinophils Relative: 1 %
HCT: 29.9 % — ABNORMAL LOW (ref 36.0–46.0)
Hemoglobin: 9.1 g/dL — ABNORMAL LOW (ref 12.0–15.0)
Immature Granulocytes: 7 %
Lymphocytes Relative: 19 %
Lymphs Abs: 1.9 10*3/uL (ref 0.7–4.0)
MCH: 30.4 pg (ref 26.0–34.0)
MCHC: 30.4 g/dL (ref 30.0–36.0)
MCV: 100 fL (ref 80.0–100.0)
Monocytes Absolute: 0.8 10*3/uL (ref 0.1–1.0)
Monocytes Relative: 8 %
Neutro Abs: 6.3 10*3/uL (ref 1.7–7.7)
Neutrophils Relative %: 64 %
Platelets: 365 10*3/uL (ref 150–400)
RBC: 2.99 MIL/uL — ABNORMAL LOW (ref 3.87–5.11)
RDW: 14.3 % (ref 11.5–15.5)
WBC: 9.9 10*3/uL (ref 4.0–10.5)
nRBC: 0.3 % — ABNORMAL HIGH (ref 0.0–0.2)

## 2020-03-18 LAB — BASIC METABOLIC PANEL
Anion gap: 10 (ref 5–15)
BUN: 36 mg/dL — ABNORMAL HIGH (ref 6–20)
CO2: 25 mmol/L (ref 22–32)
Calcium: 9.2 mg/dL (ref 8.9–10.3)
Chloride: 97 mmol/L — ABNORMAL LOW (ref 98–111)
Creatinine, Ser: 1.44 mg/dL — ABNORMAL HIGH (ref 0.44–1.00)
GFR calc Af Amer: 46 mL/min — ABNORMAL LOW (ref >60)
GFR calc non Af Amer: 40 mL/min — ABNORMAL LOW (ref >60)
Glucose, Bld: 191 mg/dL — ABNORMAL HIGH (ref 70–99)
Potassium: 4.5 mmol/L (ref 3.5–5.1)
Sodium: 132 mmol/L — ABNORMAL LOW (ref 135–145)

## 2020-03-18 LAB — SARS CORONAVIRUS 2 BY RT PCR (DIASORIN): SARS Coronavirus 2: NEGATIVE

## 2020-03-18 MED ORDER — GABAPENTIN 100 MG PO CAPS: 100.0000 mg | ORAL_CAPSULE | Freq: Two times a day (BID) | ORAL | Status: DC

## 2020-03-18 MED ORDER — ALPRAZOLAM 0.5 MG PO TABS: 0.5000 mg | ORAL_TABLET | Freq: Three times a day (TID) | ORAL | 0 refills | Status: DC | PRN

## 2020-03-18 MED ORDER — IRBESARTAN 150 MG PO TABS: 150.0000 mg | ORAL_TABLET | Freq: Every day | ORAL | Status: DC

## 2020-03-18 MED ORDER — OXYCODONE HCL 5 MG PO TABS: 5.0000 mg | ORAL_TABLET | Freq: Four times a day (QID) | ORAL | 0 refills | Status: DC | PRN

## 2020-03-18 MED ORDER — DULOXETINE HCL 60 MG PO CPEP: 60.0000 mg | ORAL_CAPSULE | Freq: Two times a day (BID) | ORAL | 3 refills | Status: DC

## 2020-03-18 MED ORDER — AMLODIPINE BESYLATE 10 MG PO TABS: 10.0000 mg | ORAL_TABLET | Freq: Every day | ORAL | Status: DC

## 2020-03-18 MED ORDER — HYDROCORTISONE 5 MG PO TABS: 5.0000 mg | ORAL_TABLET | Freq: Two times a day (BID) | ORAL | Status: DC

## 2020-03-18 MED ORDER — LEVOTHYROXINE SODIUM 100 MCG PO TABS: 100.0000 ug | ORAL_TABLET | Freq: Every day | ORAL | Status: DC

## 2020-03-18 NOTE — Progress Notes (Signed)
Report called to [place] at [facility].  Waiting for [facility] to come to transport her.  Denies complaints.  No signs of distress.  All questions answered.

## 2020-03-18 NOTE — Discharge Summary (Signed)
Physician Discharge Summary  Brianna Porter HFW:[id number] DOB: 1961/05/03 DOA: 03/04/2020  PCP: Cher Nakai, MD  Admit date: 03/04/2020 Discharge date: 03/18/2020  Admitted From: Home Disposition:  Home  Discharge Condition:Stable CODE STATUS:FULL Diet recommendation: Heart Healthy  Brief/Interim Summary: Patient is a 59 year old female with history of chronic A. fib on Eliquis, nonischemic cardiomyopathy, depression with anxiety, chronic anemia, hypertension who presented to the emergency department on 7/17 with bilateral lower extremity paresthesias, right leg weakness, intermittent limb jerking,suspected  convulsions.  CT head, MRI done in the emergency room was negative for acute intracranial abnormalities.  EEG did not show any seizure activity.  She was found to have severe hypothyroidism after admission and started on Synthyroid.  Also found to have adrenal insufficiency and started on steroids.  Patient seen by PT/OT and recommended skilled nursing facility.  Patient is medically stable for discharge to skilled nursing facility today.  Following problems were addressed during her hospitalization:  Severe hypothyroidism: TSH was elevated at 74.  Amiodarone was stopped.  Free T4 was also very low.  No finding of hypothermia, hypoglycemia.  Continue Synthyroid 100 mcg daily.  Plan to recheck TSH in 4 to 6 weeks.  Adrenal insufficiency: Random serum cortisol was low.  On Cortef 2 mg twice a day.  She needs to follow-up with endocrinology as an outpatient.  Paresthesia/weakness/intermittent jerking: MRI of the brain did not show any acute intracranial abnormality.  EEG denies any seizures.  MRI of the cervical/thoracic/lumbar spine revealed only mild lumbar spondylosis with no acute finding.  Normal folic acid, vitamin O27 level.  Normal ammonia level.  UDS unrevealing.  Stopped Neurontin as recommended by neurology.  She complained of severe tingling and numbness of her right lower extremity  today. We started Neurontin  at low-dose.  Chronic A. fib: On Eliquis.  Currently rate is controlled.  Stopped amiodarone due to prolonged QTC cardiology recommended not  to start on amiodarone in the future given her hypothyroidism.  Continue Toprol for rate control.  QTC prolongation: QTC on June 16 was 550.  Repeat EKG showed QTC normalized to 437 ms  Nonischemic cardiomyopathy/systolic congestive heart failure: Ejection fraction of 20 to 25% as per September 2020 but repeat echo showed improved ejection fraction to 40 to 45%.  Currently euvolemic.  On ARB, Lasix  Elevated transaminases: Hepatitis panel negative.  Ultrasound of the liver showed fatty changes.  Hypertension: Consistently hypertensive.Monitor blood pressure.  Continue current medications. Added amlodipine  CKD stage IIIb/anemia of chronic disease: Currently kidney function at baseline.  Depression/anxiety: Currently mood is stable.  Left shoulder pain: Underwent IR guided arthrocentesis on 03/14/20.  We will follow-up fluid analysis.  She will follow-up with orthopedics as an outpatient.  MRI was consistent with chronic rotator cuff arthropathy.  Also received steroid injection.  Right knee deformity: Continue supportive care.  Imaging showed mild degenerative changes.  Follow-up with orthopedics as an outpatient  Numerous skin wounds/blisters/ecchymosis: Continue supportive care, wound care.  Weakness/deconditioning: PT/OT recommended skilled nursing facility.   Discharge Diagnoses:  Principal Problem:   Encephalopathy Active Problems:   Type 2 diabetes mellitus with complication, without long-term current use of insulin (HCC)   Anemia   CKD (chronic kidney disease) stage 3, GFR 30-59 ml/min   Persistent atrial fibrillation (HCC)   Essential hypertension   CAD (coronary artery disease)   NICM (nonischemic cardiomyopathy) (HCC)   Elevated liver function tests   Convulsions [facility])    Discharge  Instructions  Discharge Instructions    Diet -  low sodium heart healthy   Complete by: As directed    Discharge instructions   Complete by: As directed    1)Please take prescribed medications as instructed 2)Do a CBC and BMP test in a week. 3)Follow up with Endocrinology in 2 weeks.  Name and number of the provider group has been attached.  Do a thyroid function test during the follow-up 4)Follow up with your PCP and Cardiologist.   Discharge wound care:   Complete by: As directed    As per wound nurse   Increase activity slowly   Complete by: As directed      Allergies as of 03/18/2020      Reactions   Ambien [zolpidem Tartrate]    Pt and family state this med makes the patient sleepwalk.   Adhesive [tape]    Tears skin, please use paper tape   Codeine Nausea And Vomiting      Medication List    STOP taking these medications   amiodarone 200 MG tablet Commonly known as: Pacerone   traMADol 50 MG tablet Commonly known as: ULTRAM     TAKE these medications   acetaminophen 325 MG tablet Commonly known as: TYLENOL Take 650 mg by mouth every 6 (six) hours as needed for mild pain.   albuterol 108 (90 Base) MCG/ACT inhaler Commonly known as: VENTOLIN HFA Inhale 2 puffs into the lungs every 6 (six) hours as needed for wheezing or shortness of breath.   ALPRAZolam 0.5 MG tablet Commonly known as: XANAX Take 1 tablet (0.5 mg total) by mouth 3 (three) times daily as needed for anxiety or sleep. What changed: when to take this   amLODipine 10 MG tablet Commonly known as: NORVASC Take 1 tablet (10 mg total) by mouth daily. Start taking on: March 19, 2020   apixaban 5 MG Tabs tablet Commonly known as: ELIQUIS Take 1 tablet (5 mg total) by mouth 2 (two) times daily. What changed: when to take this   buPROPion 150 MG 24 hr tablet Commonly known as: WELLBUTRIN XL Take 150 mg by mouth at bedtime.   cetirizine 10 MG tablet Commonly known as: ZYRTEC Take 10 mg by mouth  daily as needed for allergies.   cholecalciferol 25 MCG (1000 UNIT) tablet Commonly known as: VITAMIN D3 Take 1,000 Units by mouth daily.   DULoxetine 60 MG capsule Commonly known as: CYMBALTA Take 1 capsule (60 mg total) by mouth 2 (two) times daily. What changed:   how much to take  when to take this   fenofibrate 48 MG tablet Commonly known as: TRICOR Take 48 mg by mouth at bedtime.   ferrous sulfate 325 (65 FE) MG tablet Take 325 mg by mouth every other day.   furosemide 40 MG tablet Commonly known as: LASIX Take 1 tablet by mouth twice daily What changed: when to take this   gabapentin 100 MG capsule Commonly known as: NEURONTIN Take 1 capsule (100 mg total) by mouth 2 (two) times daily. What changed:   medication strength  how much to take   hydrocortisone 5 MG tablet Commonly known as: CORTEF Take 1 tablet (5 mg total) by mouth 2 (two) times daily.   ICY HOT ADVANCED RELIEF EX Apply 1 application topically 3 (three) times daily as needed (pain).   irbesartan 150 MG tablet Commonly known as: AVAPRO Take 1 tablet (150 mg total) by mouth daily. Start taking on: March 19, 2020   levothyroxine 100 MCG tablet Commonly known as: SYNTHROID Take 1  tablet (100 mcg total) by mouth daily at 6 (six) AM. Start taking on: March 19, 2020   meclizine 12.5 MG tablet Commonly known as: ANTIVERT Take 1 tablet (12.5 mg total) by mouth 3 (three) times daily as needed for dizziness.   metoprolol succinate 100 MG 24 hr tablet Commonly known as: TOPROL-XL Take 1 tablet (100 mg total) by mouth daily. Take with or immediately following a meal.   OVER THE COUNTER MEDICATION Apply 1 application topically daily as needed (pain).   oxyCODONE 5 MG immediate release tablet Commonly known as: Oxy IR/ROXICODONE Take 1 tablet (5 mg total) by mouth every 6 (six) hours as needed for severe pain.   pantoprazole 40 MG tablet Commonly known as: PROTONIX Take 40 mg by mouth 2 (two)  times daily.   polyethylene glycol 17 g packet Commonly known as: MIRALAX / GLYCOLAX Take 17 g by mouth at bedtime.   potassium chloride SA 20 MEQ tablet Commonly known as: KLOR-CON Take 1 tablet (20 mEq total) by mouth 2 (two) times daily. What changed: when to take this   promethazine 25 MG tablet Commonly known as: PHENERGAN Take 25 mg by mouth every 6 (six) hours as needed for nausea or vomiting.   tetrahydrozoline-zinc 0.05-0.25 % ophthalmic solution Commonly known as: VISINE-AC Place 2 drops into both eyes 3 (three) times daily as needed (dry eyes).   vitamin C 1000 MG tablet Take 1,000 mg by mouth daily.            Discharge Care Instructions  (From admission, onward)         Start     Ordered   03/18/20 0000  Discharge wound care:       Comments: As per wound nurse   03/18/20 1141          Contact information for follow-up providers    Richardo Priest, MD Follow up on 03/24/2020.   Specialty: Cardiology Why: at [facility] information: [place] [address] [phone]        Cher Nakai, MD Follow up in 1 week(s).   Specialty: Internal Medicine Why: Hospital discharge follow-up, PCP to repeat thyroid function in 4 to 6 weeks pcp to refer you to endocrinologist for hypothyroidism and adrenal insufficiency. Contact information: [place] [address] [facility]. Schedule an appointment as soon as possible for a visit in 3 week(s).   Specialty: Internal Medicine Why: for hypothyroidism and adrenal insufficiency  Contact information: [address] [place] [address] [phone]           Contact information for after-discharge care    Destination    [facility] .   Service: Skilled Nursing Contact information: [address] [place] [address] [phone]                  Allergies  Allergen Reactions  . Ambien [Zolpidem Tartrate]     Pt and family state this med makes the patient sleepwalk.  . Adhesive [Tape]     Tears skin, please use paper tape  . Codeine Nausea And Vomiting    Consultations:  Cardiology,neurology   Procedures/Studies: EEG  Result Date: 03/05/2020 Lora Havens, MD     03/05/2020  9:09 AM Patient Name: Brianna Porter MRN: [id number] Epilepsy Attending: Lora Havens Referring Physician/Provider: Dr Mitzi Hansen Date: 03/05/2020 Duration: 27.29 mins  Patient history: 59yo F presenting wiyh paresthesias and intermittent limb jerking. EEG to evaluate for seizure Level of alertness: Awake, asleep AEDs during EEG study: Gabapentin Technical aspects: This EEG study was done with scalp electrodes positioned according to the 10-20 International system of electrode placement. Electrical activity was acquired at a sampling rate of 500Hz  and reviewed with a high frequency filter of 70Hz  and a low frequency filter of 1Hz . EEG data were recorded continuously and digitally stored. Description: The posterior dominant rhythm consists of 10 Hz activity of moderate voltage (25-35 uV) seen predominantly in posterior head regions, symmetric and reactive to eye opening and eye closing. Sleep was characterized by vertex waves, sleep spindles (12 to 14 Hz), maximal frontocentral region. Hyperventilation did not show any EEG change. Photic stimulation was not performed.   IMPRESSION: This study is within normal limits. No seizures or epileptiform discharges were seen throughout the recording. Lora Havens   DG Knee 1-2 Views Right  Result Date: 03/11/2020 CLINICAL DATA:  Right-sided knee pain EXAM: RIGHT KNEE - 1-2 VIEW COMPARISON:  01/19/2019 FINDINGS: No fracture or malalignment. Mild lateral and patellofemoral degenerative change. No large knee effusion. IMPRESSION: Mild degenerative change. Electronically Signed   By: Donavan Foil M.D.   On:  03/11/2020 17:02   CT Head Wo Contrast  Result Date: 03/04/2020 CLINICAL DATA:  Right leg weakness, numbness, and tingling. Speech difficulty. EXAM: CT HEAD WITHOUT CONTRAST TECHNIQUE: Contiguous axial images were obtained from the base of the skull through the vertex without intravenous contrast. COMPARISON:  02/23/2020 FINDINGS: Brain: Scattered hypodensities within the periventricular white matter compatible with stable chronic small vessel ischemic changes. No signs of acute infarct or hemorrhage. Lateral ventricles and midline structures are unremarkable. No acute extra-axial fluid collections. No mass effect. Vascular: No hyperdense vessel or unexpected calcification. Skull: Normal. Negative for fracture or focal lesion. Sinuses/Orbits: No acute finding. Other: None. IMPRESSION: 1. Stable head CT, no acute process. Electronically Signed   By: Randa Ngo M.D.   On: 03/04/2020 23:35   CT Head Wo Contrast  Result Date: 02/23/2020 CLINICAL DATA:  Head trauma, minor (Age >= 65y) on eliquis EXAM: CT HEAD WITHOUT CONTRAST TECHNIQUE: Contiguous axial images were obtained from the base of the skull through the vertex without intravenous contrast. COMPARISON:  Head CT 12/22/2019 FINDINGS: Brain: No evidence of acute infarction, hemorrhage, hydrocephalus, extra-axial collection or mass lesion/mass effect. Mild generalized atrophy and chronic small vessel ischemia. Vascular: Atherosclerosis of skullbase vasculature without hyperdense vessel or abnormal calcification. Skull: No fracture or focal lesion. Sinuses/Orbits: Chronic opacification of a single left mastoid air cell. Leftward nasal septal deviation. No acute findings. Previous left face contusion has resolved. Other: None. IMPRESSION: 1. No acute intracranial abnormality. No skull fracture. 2. Mild atrophy and chronic small vessel ischemia. Electronically Signed   By: Keith Rake M.D.   On: 02/23/2020 19:49   MR BRAIN WO CONTRAST  Result Date:  03/05/2020 CLINICAL DATA:  Initial evaluation for acute neuro deficit, stroke suspected. EXAM: MRI HEAD WITHOUT CONTRAST TECHNIQUE: Multiplanar, multiecho pulse sequences of the brain and surrounding structures were obtained without intravenous contrast. COMPARISON:  Prior head CT from 03/04/2020. FINDINGS: Brain: Examination moderately degraded by motion artifact. Cerebral volume within normal limits for age. Patchy T2/FLAIR hyperintensity within the periventricular deep white matter both cerebral hemispheres most consistent with chronic small vessel ischemic disease, mild to moderate in nature. No abnormal foci of restricted diffusion to suggest acute or subacute ischemia. Gray-white matter differentiation maintained. No  encephalomalacia to suggest chronic cortical infarction. No acute or chronic intracranial hemorrhage. No mass lesion, midline shift or mass effect. No hydrocephalus or extra-axial fluid collection. Pituitary gland suprasellar region within normal limits. Midline structures intact. Vascular: Major intracranial vascular flow voids are maintained. Skull and upper cervical spine: Craniocervical junction within normal limits. Upper cervical spine normal. Bone marrow signal intensity within normal limits. No scalp soft tissue abnormality. Sinuses/Orbits: Globes and orbital soft tissues within normal limits. Paranasal sinuses are clear. Small bilateral mastoid effusions noted, of doubtful significance. Other: None. IMPRESSION: 1. No acute intracranial abnormality. 2. Mild to moderate chronic microvascular ischemic disease. Electronically Signed   By: Jeannine Boga M.D.   On: 03/05/2020 03:41   MR BRAIN WO CONTRAST  Result Date: 02/23/2020 CLINICAL DATA:  Head trauma. Anticoagulated. Mental status changes. EXAM: MRI HEAD WITHOUT CONTRAST TECHNIQUE: Multiplanar, multiecho pulse sequences of the brain and surrounding structures were obtained without intravenous contrast. COMPARISON:  Head CT same  day.  MRI 05/03/2019. FINDINGS: Brain: Diffusion imaging does not show any acute or subacute infarction. No focal abnormality affects the brainstem or cerebellum. Cerebral hemispheres show moderate small-vessel ischemic changes affecting the deep and subcortical white matter. This is similar compared to the previous study, with exception that the abnormalities are more clearly seen because of less motion degradation. It is possible that could have been some interval worsening, but there is no acute or subacute finding. No mass lesion, hemorrhage, hydrocephalus or extra-axial collection. Vascular: Major vessels at the base of the brain show flow. Skull and upper cervical spine: Negative Sinuses/Orbits: Clear/normal Other: None IMPRESSION: No acute or subacute insult. Moderate changes of chronic small vessel disease throughout the cerebral hemispheric white matter. This is either better seen because of less motion degradation and/or slightly progressive since the study of September 2020. Electronically Signed   By: Nelson Chimes M.D.   On: 02/23/2020 21:56   MR CERVICAL SPINE WO CONTRAST  Result Date: 03/05/2020 CLINICAL DATA:  Acute onset right leg weakness, numbness and tingling on approximately 03/03/2020. Right worse than left leg weakness. History of multiple falls. Initial encounter. EXAM: MRI CERVICAL SPINE WITHOUT CONTRAST TECHNIQUE: Multiplanar, multisequence MR imaging of the cervical spine was performed. No intravenous contrast was administered. COMPARISON:  Brain MRI this same day. Cervical spine MRI 08/01/2011. FINDINGS: Alignment: The exam is degraded by patient motion. There is mild reversal of the normal cervical lordosis. Vertebrae: No fracture, evidence of discitis, or bone lesion. Cord: Appears normal. Posterior Fossa, vertebral arteries, paraspinal tissues: Negative. Disc levels: C2-3: The right facets appear ankylosed. No disc bulge or protrusion. No stenosis. C3-4: Minimal disc bulge and mild  facet arthropathy without stenosis. C4-5: Shallow disc bulge to the right. Shallow right paracentral protrusion seen on the prior examination has regressed. The central canal is open. Very mild bilateral foraminal narrowing. C5-6: Negative. C6-7: Negative. C7-T1: Negative. IMPRESSION: Motion degraded examination demonstrates no finding to explain the patient's symptoms. No new abnormality since the prior MRI. The central canal is widely patent at all levels. Very mild bilateral foraminal narrowing C4-5 is seen. Electronically Signed   By: Inge Rise M.D.   On: 03/05/2020 12:33   MR THORACIC SPINE WO CONTRAST  Result Date: 03/05/2020 CLINICAL DATA:  Acute onset right leg weakness, numbness and tingling on approximately 03/03/2020. Right worse than left leg weakness. History of multiple falls. Initial encounter. EXAM: MRI THORACIC SPINE WITHOUT CONTRAST TECHNIQUE: Multiplanar, multisequence MR imaging of the thoracic spine was performed. No intravenous contrast  was administered. COMPARISON:  None. FINDINGS: Alignment:  Trace anterolisthesis T2 on T3.  Otherwise maintained. Vertebrae: No fracture, evidence of discitis, or bone lesion. Cord: Motion degrades evaluation but no signal abnormality in the cord is seen. Paraspinal and other soft tissues: Negative. Disc levels: The central canal and neural foramina are widely patent at all levels with scattered mild degenerative disc disease most notable at T9-10 where there is a shallow left paracentral protrusion. IMPRESSION: No finding to explain the patient's symptoms. The thoracic spinal cord is normal in appearance. Mild thoracic degenerative change without stenosis. Electronically Signed   By: Inge Rise M.D.   On: 03/05/2020 12:42   MR LUMBAR SPINE WO CONTRAST  Result Date: 03/05/2020 CLINICAL DATA:  Acute onset right leg weakness, numbness and tingling on approximately 03/03/2020. Right worse than left leg weakness. History of multiple falls.  Initial encounter. EXAM: MRI LUMBAR SPINE WITHOUT CONTRAST TECHNIQUE: Multiplanar, multisequence MR imaging of the lumbar spine was performed. No intravenous contrast was administered. COMPARISON:  None. FINDINGS: Segmentation:  Standard. Alignment:  Maintained. Vertebrae:  No fracture, evidence of discitis, or bone lesion. Conus medullaris and cauda equina: Conus extends to the L1 level. Conus and cauda equina appear normal. Paraspinal and other soft tissues: Negative. Disc levels: L1-2: Loss of disc space height with a shallow bulge.  No stenosis. L2-3: Loss of disc space height with a bulge and endplate spur more prominent to the left. Mild left foraminal narrowing. The central canal and right foramen are open. L3-4: Disc bulge to the right causes mild narrowing in the right lateral recess. Foramina open. L4-5: Shallow disc bulge without stenosis. L5-S1: Negative. IMPRESSION: No finding to explain the patient's symptoms. Mild lumbar spondylosis as described above. Electronically Signed   By: Inge Rise M.D.   On: 03/05/2020 12:48   MR SHOULDER LEFT WO CONTRAST  Result Date: 03/10/2020 CLINICAL DATA:  Shoulder pain EXAM: MRI OF THE LEFT SHOULDER WITHOUT CONTRAST TECHNIQUE: Multiplanar, multisequence MR imaging of the shoulder was performed. No intravenous contrast was administered. COMPARISON:  X-ray 02/23/2020 FINDINGS: Technical note: Motion degraded examination Rotator cuff: Complete full-thickness tear of the supraspinatus tendon retracted to the level of the glenohumeral joint. Tear extends to involve the interdigitating and anterior insertional fibers of the infraspinatus tendon. The mid to posterior portions of the infraspinatus tendon are intact but appear attenuated. Complete full-thickness retracted tear of the subscapularis tendon. Intact teres minor. Muscles: Atrophy of the supraspinatus and subscapularis muscles. Rotator cuff intramuscular edema most pronounced at the supraspinatus and  subscapularis. Biceps long head: Torn and retracted to the level of the proximal humeral metaphysis. Acromioclavicular Joint: Mild arthropathy of the acromioclavicular joint. Moderate subacromial-subdeltoid bursitis. Glenohumeral Joint: Extensive high-grade cartilage loss of the humeral head and glenoid. Moderate joint effusion with extensive synovitis. Labrum:  Circumferentially degenerated and torn. Bones: Large erosion of the posterosuperior aspect of the humeral head (series 3, images 7-8; series 8, images 8-11). Humeral head is high-riding relative to the glenoid without dislocation. No acute fracture. No suspicious marrow replacing lesion. Other: Prominent periarticular soft tissue edema. IMPRESSION: 1. Left shoulder joint effusion with extensive glenohumeral joint synovitis and subacromial-subdeltoid bursitis with large erosion at the posterosuperior humeral head. Differential includes sequela of an inflammatory or crystalline arthropathy. Infection with septic arthritis and humeral head osteomyelitis is not excluded and arthrocentesis with joint fluid analysis is recommended. 2. Chronic complete full-thickness tear of the supraspinatus tendon retracted to the level of the glenohumeral joint. Tear extends to involve the  interdigitating and anterior insertional fibers of the infraspinatus tendon. The mid to posterior portions of the infraspinatus tendon are intact but appear attenuated. 3. Chronic full-thickness retracted tear of the subscapularis tendon. 4. Long head biceps tendon is torn and retracted. These results will be called to the ordering clinician or representative by the Radiologist Assistant, and communication documented in the PACS or [employer]. Electronically Signed   By: Davina Poke D.O.   On: 03/10/2020 09:51   US Abdomen Limited  Result Date: 03/10/2020 CLINICAL DATA:  Elevated LFTs EXAM: ULTRASOUND ABDOMEN LIMITED RIGHT UPPER QUADRANT COMPARISON:  None. FINDINGS: Gallbladder:  No gallstones or wall thickening visualized. No sonographic Murphy sign noted by sonographer. Common bile duct: Diameter: 3.9 mm. Liver: Mild increased echogenicity is noted consistent with fatty infiltration. No focal mass is seen. No significant nodularity is noted. Portal vein is patent on color Doppler imaging with normal direction of blood flow towards the liver. Other: None. IMPRESSION: Changes of fatty infiltration of the liver. No other focal abnormality is noted. Electronically Signed   By: Inez Catalina M.D.   On: 03/10/2020 08:30   DG Chest Portable 1 View  Result Date: 03/04/2020 CLINICAL DATA:  Speech difficulty EXAM: PORTABLE CHEST 1 VIEW COMPARISON:  12/22/2019 FINDINGS: The heart size and mediastinal contours are within normal limits. Both lungs are clear. The visualized skeletal structures are unremarkable. IMPRESSION: No active disease. Electronically Signed   By: Randa Ngo M.D.   On: 03/04/2020 23:23   DG Shoulder Left  Result Date: 02/23/2020 CLINICAL DATA:  59 year old female with fall and left shoulder pain. EXAM: LEFT SHOULDER - 2+ VIEW COMPARISON:  Left shoulder radiograph dated 05/01/2019. FINDINGS: There is no acute fracture or dislocation. There is mild narrowing of the glenohumeral joint space. The soft tissues are unremarkable. IMPRESSION: Negative. Electronically Signed   By: Anner Crete M.D.   On: 02/23/2020 16:41   DG FLUORO GUIDED NEEDLE PLC ASPIRATION/INJECTION LOC  Result Date: 03/14/2020 CLINICAL DATA:  59 year old female with history of left-sided shoulder pain. EXAM: LEFT SHOULDER INJECTION UNDER FLUOROSCOPY TECHNIQUE: An appropriate skin entrance site was determined. The site was marked, prepped with Betadine, draped in the usual sterile fashion, and infiltrated locally with buffered Lidocaine. 22 gauge spinal needle was advanced to the superomedial margin of the humeral head under intermittent fluoroscopy. No immediate complication. FLUOROSCOPY TIME:   Fluoroscopy Time:  42 seconds Radiation Exposure Index (if provided by the fluoroscopic device): 2.1 mGy FINDINGS: After placement of the needle, multiple attempts were made to aspirate fluid from the joint space, which were unsuccessful. Subsequently, a small amount of Omnipaque 180 was injected into the joint space confirming proper placement of the needle. This contrast medium was aspirated as much as possible, and subsequently the joint was infused with 80 mg of Depo-Medrol diluted in 5 mL of bupivacaine. IMPRESSION: Technically successful left shoulder injection for MRI. Electronically Signed   By: Vinnie Langton M.D.   On: 03/14/2020 14:34   ECHOCARDIOGRAM COMPLETE  Result Date: 03/08/2020    ECHOCARDIOGRAM REPORT   Patient Name:   Brianna Porter Date of Exam: 03/08/2020 Medical Rec #:  [id number]       Height:       62.0 in Accession #:    [id number]      Weight:       159.4 lb Date of Birth:  Sep 19, 1960       BSA:          1.736 m Patient Age:  58 years        BP:           158/79 mmHg Patient Gender: F               HR:           59 bpm. Exam Location:  Inpatient Procedure: 2D Echo Indications:    cardiomyopathy  History:        Patient has prior history of Echocardiogram examinations, most                 recent 04/29/2019. CHF and Cardiomyopathy, CAD, Arrythmias:Atrial                 Flutter; Risk Factors:Diabetes, Hypertension, Dyslipidemia and                 Former Smoker. NICM.  Sonographer:    Jannett Celestine RDCS (AE) Referring Phys: 57 [place] Comments: Image acquisition challenging due to patient body habitus. IMPRESSIONS  1. EF now 45-50% compared with prior. Prior echo was in Afib with RVR ~150 bpm.  2. Left ventricular ejection fraction, by estimation, is 45 to 50%. The left ventricle has mildly decreased function. The left ventricle demonstrates global hypokinesis. Left ventricular diastolic parameters are consistent with Grade II diastolic dysfunction  (pseudonormalization). Elevated left atrial pressure.  3. Right ventricular systolic function is mildly reduced. The right ventricular size is normal. Tricuspid regurgitation signal is inadequate for assessing PA pressure.  4. The mitral valve is grossly normal. Trivial mitral valve regurgitation. No evidence of mitral stenosis.  5. The aortic valve is grossly normal. Aortic valve regurgitation is not visualized. No aortic stenosis is present. Comparison(s): Changes from prior study are noted. The left ventricular function has improved. FINDINGS  Left Ventricle: Left ventricular ejection fraction, by estimation, is 45 to 50%. The left ventricle has mildly decreased function. The left ventricle demonstrates global hypokinesis. The left ventricular internal cavity size was normal in size. There is  no left ventricular hypertrophy. Left ventricular diastolic parameters are consistent with Grade II diastolic dysfunction (pseudonormalization). Elevated left atrial pressure. Right Ventricle: The right ventricular size is normal. No increase in right ventricular wall thickness. Right ventricular systolic function is mildly reduced. Tricuspid regurgitation signal is inadequate for assessing PA pressure. Left Atrium: Left atrial size was normal in size. Right Atrium: Right atrial size was normal in size. Pericardium: Trivial pericardial effusion is present. Presence of pericardial fat pad. Mitral Valve: The mitral valve is grossly normal. Trivial mitral valve regurgitation. No evidence of mitral valve stenosis. Tricuspid Valve: The tricuspid valve is grossly normal. Tricuspid valve regurgitation is not demonstrated. No evidence of tricuspid stenosis. Aortic Valve: The aortic valve is grossly normal. Aortic valve regurgitation is not visualized. No aortic stenosis is present. Pulmonic Valve: The pulmonic valve was grossly normal. Pulmonic valve regurgitation is not visualized. No evidence of pulmonic stenosis. Aorta: The  aortic root is normal in size and structure. Venous: The inferior vena cava was not well visualized. IAS/Shunts: The atrial septum is grossly normal.  LEFT VENTRICLE PLAX 2D LVIDd:         4.20 cm  Diastology LVIDs:         2.10 cm  LV e' lateral:   9.46 cm/s LV PW:         1.50 cm  LV E/e' lateral: 10.2 LV IVS:        1.00 cm  LV e' medial:    6.09 cm/s  LVOT diam:     1.90 cm  LV E/e' medial:  15.8 LV SV:         55 LV SV Index:   32 LVOT Area:     2.84 cm  LEFT ATRIUM           Index LA diam:      3.40 cm 1.96 cm/m LA Vol (A4C): 25.7 ml 14.80 ml/m  AORTIC VALVE LVOT Vmax:   84.90 cm/s LVOT Vmean:  56.200 cm/s LVOT VTI:    0.193 m  AORTA Ao Root diam: 2.70 cm MITRAL VALVE MV Area (PHT): 3.37 cm    SHUNTS MV Decel Time: 225 msec    Systemic VTI:  0.19 m MV E velocity: 96.40 cm/s  Systemic Diam: 1.90 cm MV A velocity: 50.10 cm/s MV E/A ratio:  1.92 Eleonore Chiquito MD Electronically signed by Eleonore Chiquito MD Signature Date/Time: 03/08/2020/1:52:13 PM    Final       Subjective: Patient seen and examined at the bedside this morning.  Hemodynamically stable for discharge.  Discharge Exam: Vitals:   03/18/20 0835 03/18/20 1133  BP: (!) 136/67 (!) 110/86  Pulse: 68 65  Resp: 16 18  Temp: 98 F (36.7 C) 98 F (36.7 C)  SpO2: 100% 100%   Vitals:   03/18/20 0005 03/18/20 0341 03/18/20 0835 03/18/20 1133  BP: (!) 139/55 (!) 121/61 (!) 136/67 (!) 110/86  Pulse: 63 60 68 65  Resp: 16 16 16 18   Temp: 97.9 F (36.6 C) 97.8 F (36.6 C) 98 F (36.7 C) 98 F (36.7 C)  TempSrc: Oral  Axillary Oral  SpO2: 100% 100% 100% 100%  Weight:      Height:        General: Pt is alert, awake, not in acute distress Cardiovascular: RRR, S1/S2 +, no rubs, no gallops Respiratory: CTA bilaterally, no wheezing, no rhonchi Abdominal: Soft, NT, ND, bowel sounds + Extremities: no edema, no cyanosis    The results of significant diagnostics from this hospitalization (including imaging, microbiology, ancillary  and laboratory) are listed below for reference.     Microbiology: Recent Results (from the past 240 hour(s))  Culture, blood (routine x 2)     Status: None   Collection Time: 03/10/20 10:47 AM   Specimen: BLOOD LEFT HAND  Result Value Ref Range Status   Specimen Description BLOOD LEFT HAND  Final   Special Requests   Final    BOTTLES DRAWN AEROBIC ONLY Blood Culture results may not be optimal due to an inadequate volume of blood received in culture bottles   Culture   Final    NO GROWTH 5 DAYS Performed at [facility], [place] [address] [place], [place] [address]    Report Status 03/15/2020 FINAL  Final  Culture, blood (routine x 2)     Status: None   Collection Time: 03/10/20 10:50 AM   Specimen: BLOOD RIGHT HAND  Result Value Ref Range Status   Specimen Description BLOOD RIGHT HAND  Final   Special Requests   Final    BOTTLES DRAWN AEROBIC ONLY Blood Culture adequate volume   Culture   Final    NO GROWTH 5 DAYS Performed at [facility], [place] [address], [place], [place] [address]    Report Status 03/15/2020 FINAL  Final  SARS Coronavirus 2 by RT PCR (hospital order, performed in [facility] hospital lab) Nasopharyngeal Nasopharyngeal Swab     Status: None   Collection Time: 03/13/20  1:12 PM   Specimen: Nasopharyngeal Swab  Result Value Ref Range Status   SARS Coronavirus 2 NEGATIVE NEGATIVE Final    Comment: (NOTE) SARS-CoV-2 target nucleic acids are NOT DETECTED.  The SARS-CoV-2 RNA is generally detectable in upper and lower respiratory specimens during the acute phase of infection. The lowest concentration of SARS-CoV-2 viral copies this assay can detect is 250 copies / mL. A negative result does not preclude SARS-CoV-2 infection and should not be used as the sole basis for treatment or other patient management decisions.  A negative result may occur with improper specimen collection / handling, submission of specimen other than nasopharyngeal swab,  presence of viral mutation(s) within the areas targeted by this assay, and inadequate number of viral copies (<250 copies / mL). A negative result must be combined with clinical observations, patient history, and epidemiological information.  Fact Sheet for Patients:   StrictlyIdeas.no  Fact Sheet for Healthcare Providers: BankingDealers.co.za  This test is not yet approved or  cleared by the [place] FDA and has been authorized for detection and/or diagnosis of SARS-CoV-2 by FDA under an Emergency Use Authorization (EUA).  This EUA will remain in effect (meaning this test can be used) for the duration of the COVID-19 declaration under Section 564(b)(1) of the Act, 21 U.S.C. section 360bbb-3(b)(1), unless the authorization is terminated or revoked sooner.  Performed at [facility], [place] [address], [place], [place] [address]      Labs: BNP (last 3 results) Recent Labs    06/28/19 1830 07/09/19 0241  BNP 168.7* 413.2*   Basic Metabolic Panel: Recent Labs  Lab 03/15/20 0940 03/18/20 0453  NA 132* 132*  K 4.4 4.5  CL 97* 97*  CO2 23 25  GLUCOSE 220* 191*  BUN 17 36*  CREATININE 1.17* 1.44*  CALCIUM 9.6 9.2   Liver Function Tests: No results for input(s): AST, ALT, ALKPHOS, BILITOT, PROT, ALBUMIN in the last 168 hours. No results for input(s): LIPASE, AMYLASE in the last 168 hours. No results for input(s): AMMONIA in the last 168 hours. CBC: Recent Labs  Lab 03/15/20 0940 03/18/20 0724  WBC 12.4* 9.9  NEUTROABS 9.7* 6.3  HGB 11.0* 9.1*  HCT 34.9* 29.9*  MCV 98.0 100.0  PLT 569* 365   Cardiac Enzymes: No results for input(s): CKTOTAL, CKMB, CKMBINDEX, TROPONINI in the last 168 hours. BNP: Invalid input(s): POCBNP CBG: No results for input(s): GLUCAP in the last 168 hours. D-Dimer No results for input(s): DDIMER in the last 72 hours. Hgb A1c No results for input(s): HGBA1C in the last 72  hours. Lipid Profile No results for input(s): CHOL, HDL, LDLCALC, TRIG, CHOLHDL, LDLDIRECT in the last 72 hours. Thyroid function studies No results for input(s): TSH, T4TOTAL, T3FREE, THYROIDAB in the last 72 hours.  Invalid input(s): FREET3 Anemia work up No results for input(s): VITAMINB12, FOLATE, FERRITIN, TIBC, IRON, RETICCTPCT in the last 72 hours. Urinalysis    Component Value Date/Time   COLORURINE STRAW (A) 03/04/2020 2210   APPEARANCEUR CLEAR 03/04/2020 2210   LABSPEC 1.003 (L) 03/04/2020 2210   PHURINE 7.0 03/04/2020 2210   GLUCOSEU NEGATIVE 03/04/2020 2210   HGBUR NEGATIVE 03/04/2020 2210   BILIRUBINUR NEGATIVE 03/04/2020 2210   KETONESUR NEGATIVE 03/04/2020 2210   PROTEINUR NEGATIVE 03/04/2020 2210   NITRITE NEGATIVE 03/04/2020 2210   LEUKOCYTESUR NEGATIVE 03/04/2020 2210   Sepsis Labs Invalid input(s): PROCALCITONIN,  WBC,  LACTICIDVEN Microbiology Recent Results (from the past 240 hour(s))  Culture, blood (routine x 2)  Status: None   Collection Time: 03/10/20 10:47 AM   Specimen: BLOOD LEFT HAND  Result Value Ref Range Status   Specimen Description BLOOD LEFT HAND  Final   Special Requests   Final    BOTTLES DRAWN AEROBIC ONLY Blood Culture results may not be optimal due to an inadequate volume of blood received in culture bottles   Culture   Final    NO GROWTH 5 DAYS Performed at [facility], [place] [address], [place], [place] [address]    Report Status 03/15/2020 FINAL  Final  Culture, blood (routine x 2)     Status: None   Collection Time: 03/10/20 10:50 AM   Specimen: BLOOD RIGHT HAND  Result Value Ref Range Status   Specimen Description BLOOD RIGHT HAND  Final   Special Requests   Final    BOTTLES DRAWN AEROBIC ONLY Blood Culture adequate volume   Culture   Final    NO GROWTH 5 DAYS Performed at [facility], [place] [address], [place], [place] [address]    Report Status 03/15/2020 FINAL  Final  SARS Coronavirus 2 by RT PCR  (hospital order, performed in [facility] hospital lab) Nasopharyngeal Nasopharyngeal Swab     Status: None   Collection Time: 03/13/20  1:12 PM   Specimen: Nasopharyngeal Swab  Result Value Ref Range Status   SARS Coronavirus 2 NEGATIVE NEGATIVE Final    Comment: (NOTE) SARS-CoV-2 target nucleic acids are NOT DETECTED.  The SARS-CoV-2 RNA is generally detectable in upper and lower respiratory specimens during the acute phase of infection. The lowest concentration of SARS-CoV-2 viral copies this assay can detect is 250 copies / mL. A negative result does not preclude SARS-CoV-2 infection and should not be used as the sole basis for treatment or other patient management decisions.  A negative result may occur with improper specimen collection / handling, submission of specimen other than nasopharyngeal swab, presence of viral mutation(s) within the areas targeted by this assay, and inadequate number of viral copies (<250 copies / mL). A negative result must be combined with clinical observations, patient history, and epidemiological information.  Fact Sheet for Patients:   StrictlyIdeas.no  Fact Sheet for Healthcare Providers: BankingDealers.co.za  This test is not yet approved or  cleared by the [place] FDA and has been authorized for detection and/or diagnosis of SARS-CoV-2 by FDA under an Emergency Use Authorization (EUA).  This EUA will remain in effect (meaning this test can be used) for the duration of the COVID-19 declaration under Section 564(b)(1) of the Act, 21 U.S.C. section 360bbb-3(b)(1), unless the authorization is terminated or revoked sooner.  Performed at [facility], [place] [address], [place], [place] [address]     Please note: You were cared for by a hospitalist during your hospital stay. Once you are discharged, your primary care physician will handle any further medical issues. Please note that NO  REFILLS for any discharge medications will be authorized once you are discharged, as it is imperative that you return to your primary care physician (or establish a relationship with a primary care physician if you do not have one) for your post hospital discharge needs so that they can reassess your need for medications and monitor your lab values.    Time coordinating discharge: 40 minutes  SIGNED:   Shelly Coss, MD  [facility] 03/18/2020, 11:44 AM Pager [phone]  If 7PM-7AM, please contact night-coverage [email or web address] Password [email or web address]

## 2020-03-18 NOTE — TOC Transition Note (Signed)
Transition of Care [facility]) - CM/SW Discharge Note   Patient Details  Name: Raysa Bosak MRN: [id number] Date of Birth: 04-21-1961  Transition of Care [facility]) CM/SW Contact:  Geralynn Ochs, LCSW Phone Number: 03/18/2020, 3:23 PM   Clinical Narrative:   Nurse to call report to [phone], Room 209.    Final next level of care: Skilled Nursing Facility Barriers to Discharge: Barriers Resolved   Patient Goals and CMS Choice   CMS Medicare.gov Compare Post Acute Care list provided to:: Patient Represenative (must comment) Albina Billet) Choice offered to / list presented to : Adult Children  Discharge Placement              Patient chooses bed at: [facility] Patient to be transferred to facility by: [place] Name of family member notified: Self, Daughter Patient and family notified of of transfer: 03/18/20  Discharge Plan and Services In-house Referral: Clinical Social Work                                   Social Determinants of Health (SDOH) Interventions     Readmission Risk Interventions Readmission Risk Prevention Plan 07/13/2019  Transportation Screening Complete  PCP or Specialist Appt within 3-5 Days Complete  HRI or [place] Complete  Social Work Consult for [place] Planning/Counseling Complete  Palliative Care Screening Not Applicable  Medication Review [employer]) Complete

## 2020-03-20 DIAGNOSIS — I482 Chronic atrial fibrillation, unspecified: Secondary | ICD-10-CM | POA: Diagnosis not present

## 2020-03-20 DIAGNOSIS — G8929 Other chronic pain: Secondary | ICD-10-CM | POA: Diagnosis not present

## 2020-03-20 DIAGNOSIS — R5381 Other malaise: Secondary | ICD-10-CM | POA: Diagnosis not present

## 2020-03-20 DIAGNOSIS — E7800 Pure hypercholesterolemia, unspecified: Secondary | ICD-10-CM | POA: Diagnosis not present

## 2020-03-20 DIAGNOSIS — R9431 Abnormal electrocardiogram [ECG] [EKG]: Secondary | ICD-10-CM | POA: Diagnosis not present

## 2020-03-20 DIAGNOSIS — R569 Unspecified convulsions: Secondary | ICD-10-CM | POA: Diagnosis not present

## 2020-03-20 DIAGNOSIS — E2740 Unspecified adrenocortical insufficiency: Secondary | ICD-10-CM | POA: Diagnosis not present

## 2020-03-20 DIAGNOSIS — I5020 Unspecified systolic (congestive) heart failure: Secondary | ICD-10-CM | POA: Diagnosis not present

## 2020-03-20 DIAGNOSIS — R69 Illness, unspecified: Secondary | ICD-10-CM | POA: Diagnosis not present

## 2020-03-20 DIAGNOSIS — Z79899 Other long term (current) drug therapy: Secondary | ICD-10-CM | POA: Diagnosis not present

## 2020-03-20 DIAGNOSIS — I110 Hypertensive heart disease with heart failure: Secondary | ICD-10-CM | POA: Diagnosis not present

## 2020-03-20 DIAGNOSIS — I429 Cardiomyopathy, unspecified: Secondary | ICD-10-CM | POA: Diagnosis not present

## 2020-03-20 DIAGNOSIS — N183 Chronic kidney disease, stage 3 unspecified: Secondary | ICD-10-CM | POA: Diagnosis not present

## 2020-03-20 DIAGNOSIS — E039 Hypothyroidism, unspecified: Secondary | ICD-10-CM | POA: Diagnosis not present

## 2020-03-20 DIAGNOSIS — R531 Weakness: Secondary | ICD-10-CM | POA: Diagnosis not present

## 2020-03-20 DIAGNOSIS — R202 Paresthesia of skin: Secondary | ICD-10-CM | POA: Diagnosis not present

## 2020-03-20 DIAGNOSIS — D649 Anemia, unspecified: Secondary | ICD-10-CM | POA: Diagnosis not present

## 2020-03-20 DIAGNOSIS — I2510 Atherosclerotic heart disease of native coronary artery without angina pectoris: Secondary | ICD-10-CM | POA: Diagnosis not present

## 2020-03-20 DIAGNOSIS — R7989 Other specified abnormal findings of blood chemistry: Secondary | ICD-10-CM | POA: Diagnosis not present

## 2020-03-20 DIAGNOSIS — I4892 Unspecified atrial flutter: Secondary | ICD-10-CM | POA: Diagnosis not present

## 2020-03-20 DIAGNOSIS — K922 Gastrointestinal hemorrhage, unspecified: Secondary | ICD-10-CM | POA: Diagnosis not present

## 2020-03-20 DIAGNOSIS — Z716 Tobacco abuse counseling: Secondary | ICD-10-CM | POA: Diagnosis not present

## 2020-03-20 DIAGNOSIS — I5022 Chronic systolic (congestive) heart failure: Secondary | ICD-10-CM | POA: Diagnosis not present

## 2020-03-20 DIAGNOSIS — G9340 Encephalopathy, unspecified: Secondary | ICD-10-CM | POA: Diagnosis not present

## 2020-03-20 DIAGNOSIS — L02429 Furuncle of limb, unspecified: Secondary | ICD-10-CM | POA: Diagnosis not present

## 2020-03-20 DIAGNOSIS — I10 Essential (primary) hypertension: Secondary | ICD-10-CM | POA: Diagnosis not present

## 2020-03-20 DIAGNOSIS — J309 Allergic rhinitis, unspecified: Secondary | ICD-10-CM | POA: Diagnosis not present

## 2020-03-20 DIAGNOSIS — N189 Chronic kidney disease, unspecified: Secondary | ICD-10-CM | POA: Diagnosis not present

## 2020-03-20 DIAGNOSIS — E875 Hyperkalemia: Secondary | ICD-10-CM | POA: Diagnosis not present

## 2020-03-20 DIAGNOSIS — I4819 Other persistent atrial fibrillation: Secondary | ICD-10-CM | POA: Diagnosis not present

## 2020-03-20 DIAGNOSIS — Z7901 Long term (current) use of anticoagulants: Secondary | ICD-10-CM | POA: Diagnosis not present

## 2020-03-20 DIAGNOSIS — E1169 Type 2 diabetes mellitus with other specified complication: Secondary | ICD-10-CM | POA: Diagnosis not present

## 2020-03-20 DIAGNOSIS — I5042 Chronic combined systolic (congestive) and diastolic (congestive) heart failure: Secondary | ICD-10-CM | POA: Diagnosis not present

## 2020-03-22 DIAGNOSIS — E039 Hypothyroidism, unspecified: Secondary | ICD-10-CM | POA: Diagnosis not present

## 2020-03-22 DIAGNOSIS — G9340 Encephalopathy, unspecified: Secondary | ICD-10-CM | POA: Diagnosis not present

## 2020-03-22 DIAGNOSIS — I5020 Unspecified systolic (congestive) heart failure: Secondary | ICD-10-CM | POA: Diagnosis not present

## 2020-03-22 DIAGNOSIS — R5381 Other malaise: Secondary | ICD-10-CM | POA: Diagnosis not present

## 2020-03-22 DIAGNOSIS — I10 Essential (primary) hypertension: Secondary | ICD-10-CM | POA: Diagnosis not present

## 2020-03-22 DIAGNOSIS — I482 Chronic atrial fibrillation, unspecified: Secondary | ICD-10-CM | POA: Diagnosis not present

## 2020-03-22 DIAGNOSIS — E2740 Unspecified adrenocortical insufficiency: Secondary | ICD-10-CM | POA: Diagnosis not present

## 2020-03-22 DIAGNOSIS — R202 Paresthesia of skin: Secondary | ICD-10-CM | POA: Diagnosis not present

## 2020-03-24 ENCOUNTER — Encounter: Payer: Self-pay | Admitting: Cardiology

## 2020-03-24 ENCOUNTER — Ambulatory Visit (INDEPENDENT_AMBULATORY_CARE_PROVIDER_SITE_OTHER): Payer: Medicare HMO | Admitting: Cardiology

## 2020-03-24 ENCOUNTER — Other Ambulatory Visit: Payer: Self-pay

## 2020-03-24 VITALS — BP 100/52 | HR 59 | Ht 62.0 in

## 2020-03-24 DIAGNOSIS — I5042 Chronic combined systolic (congestive) and diastolic (congestive) heart failure: Secondary | ICD-10-CM

## 2020-03-24 DIAGNOSIS — G9340 Encephalopathy, unspecified: Secondary | ICD-10-CM | POA: Diagnosis not present

## 2020-03-24 DIAGNOSIS — R5381 Other malaise: Secondary | ICD-10-CM | POA: Diagnosis not present

## 2020-03-24 DIAGNOSIS — E039 Hypothyroidism, unspecified: Secondary | ICD-10-CM | POA: Diagnosis not present

## 2020-03-24 DIAGNOSIS — I110 Hypertensive heart disease with heart failure: Secondary | ICD-10-CM

## 2020-03-24 DIAGNOSIS — I2510 Atherosclerotic heart disease of native coronary artery without angina pectoris: Secondary | ICD-10-CM | POA: Diagnosis not present

## 2020-03-24 DIAGNOSIS — I482 Chronic atrial fibrillation, unspecified: Secondary | ICD-10-CM | POA: Diagnosis not present

## 2020-03-24 DIAGNOSIS — R202 Paresthesia of skin: Secondary | ICD-10-CM | POA: Diagnosis not present

## 2020-03-24 DIAGNOSIS — I4819 Other persistent atrial fibrillation: Secondary | ICD-10-CM | POA: Diagnosis not present

## 2020-03-24 DIAGNOSIS — N183 Chronic kidney disease, stage 3 unspecified: Secondary | ICD-10-CM | POA: Diagnosis not present

## 2020-03-24 DIAGNOSIS — I5022 Chronic systolic (congestive) heart failure: Secondary | ICD-10-CM | POA: Diagnosis not present

## 2020-03-24 DIAGNOSIS — Z7901 Long term (current) use of anticoagulants: Secondary | ICD-10-CM

## 2020-03-24 DIAGNOSIS — I5020 Unspecified systolic (congestive) heart failure: Secondary | ICD-10-CM | POA: Diagnosis not present

## 2020-03-24 DIAGNOSIS — R9431 Abnormal electrocardiogram [ECG] [EKG]: Secondary | ICD-10-CM

## 2020-03-24 DIAGNOSIS — I10 Essential (primary) hypertension: Secondary | ICD-10-CM | POA: Diagnosis not present

## 2020-03-24 DIAGNOSIS — E2740 Unspecified adrenocortical insufficiency: Secondary | ICD-10-CM | POA: Diagnosis not present

## 2020-03-24 NOTE — Patient Instructions (Signed)
Medication Instructions:  Your physician has recommended you make the following change in your medication:  STOP: Amlodipine   Check your blood pressure daily. If it is staying above 162 systolic please restart and take 5 mg daily.  *If you need a refill on your cardiac medications before your next appointment, please call your pharmacy*   Lab Work: None If you have labs (blood work) drawn today and your tests are completely normal, you will receive your results only by: Marland Kitchen MyChart Message (if you have MyChart) OR . A paper copy in the mail If you have any lab test that is abnormal or we need to change your treatment, we will call you to review the results.   Testing/Procedures: None   Follow-Up: At [facility], you and your health needs are our priority.  As part of our continuing mission to provide you with exceptional heart care, we have created designated Provider Care Teams.  These Care Teams include your primary Cardiologist (physician) and Advanced Practice Providers (APPs -  Physician Assistants and Nurse Practitioners) who all work together to provide you with the care you need, when you need it.  We recommend signing up for the patient portal called "MyChart".  Sign up information is provided on this After Visit Summary.  MyChart is used to connect with patients for Virtual Visits (Telemedicine).  Patients are able to view lab/test results, encounter notes, upcoming appointments, etc.  Non-urgent messages can be sent to your provider as well.   To learn more about what you can do with MyChart, go to [email or web address].    Your next appointment:   3 month(s)  The format for your next appointment:   In Person  Provider:   Shirlee More, MD   Other Instructions

## 2020-03-24 NOTE — Progress Notes (Signed)
Cardiology Office Note:    Date:  03/24/2020   ID:  Brianna Porter, DOB [birth year], MRN [id number]  PCP:  Cher Nakai, MD  Cardiologist:  Shirlee More, MD    Referring MD: Cher Nakai, MD    ASSESSMENT:    1. Persistent atrial fibrillation ([place])   2. Chronic anticoagulation   3. Hypertensive heart disease with chronic systolic congestive heart failure ([place])   4. Chronic combined systolic and diastolic heart failure ([place])   5. Prolonged Q-T interval on ECG   6. Stage 3 chronic kidney disease, unspecified whether stage 3a or 3b CKD   7. Mild CAD    PLAN:    In order of problems listed above:  1. She had a recent complex medical admission to the hospital with persistent atrial fibrillation amiodarone intolerance with both thyrotoxicosis and severe symptomatic hypothyroidism and QT prolongation the drug was withdrawn rate is controlled and she is anticoagulated.  I would not resume sinus rhythm at this time with attempted cardioversions or other antiarrhythmic drugs. 2. Improved ejection fraction markedly improved heart failure compensated blood pressure low stop her calcium channel blocker. 3. Heart failure is compensated continue current diuretic 4. Repeat EKG today with prolonged QT interval 5. Stable CKD 6. Stable asymptomatic.   Next appointment: 3 months   Medication Adjustments/Labs and Tests Ordered: Current medicines are reviewed at length with the patient today.  Concerns regarding medicines are outlined above.  No orders of the defined types were placed in this encounter.  No orders of the defined types were placed in this encounter.   Chief Complaint  Patient presents with   Follow-up    No longer in amiodarone   Atrial Fibrillation    History of Present Illness:    Brianna Porter is a 59 y.o. female with a hx of paroxysmal atrial fibrillation on amiodarone cardiomyopathy ejection fraction in the range 20 to 25% and mild nonobstructive CAD last seen  10/16/2019. Compliance with diet, lifestyle and medications: Yes  She was admitted to [facility] 03/04/2020 to 03/18/2020 with the predominant complaint of weakness.  I she was found to be severely hypothyroid and adrenal insufficient and was put on thyroid and adrenal replacement therapy.  Amiodarone was discontinued in hospital and was found to be in atrial fibrillation with QTC prolongation.  Repeat echocardiogram showed improvement in ejection fraction to 40 to 45% she had abnormal liver function with hepatic steatosis and had stable stage III CKD  She now resides at [facility] and she is engaged in rehab.  Her goal is to be able to walk independently.  She is asymptomatic with atrial fibrillation the rate is controlled no chest pain edema shortness of breath or syncope.  Her blood pressure is relatively low I will stop her calcium channel blocker and I told him if she sustained systolics greater than 222 to resume at a lower dose.  She is now on thyroid supplement and adrenal replacement. Past Medical History:  Diagnosis Date   Acute gastric ulcer with hemorrhage    Altered mental status 05/07/2019   Anxiety and depression    Atrial flutter (HCC)    CAD (coronary artery disease)    Chronic systolic CHF (congestive heart failure) ([place])    a. dx in setting of atrial fib/flutter - possibly tachy mediated. Coronary CTA with only mild CAD in 04/2019.   CKD (chronic kidney disease) stage 3, GFR 30-59 ml/min 04/23/2019   Confusion    a. persistent confusion  during 04/2019 admission of unclear cause. Home meds adjusted. CT/MRI brain nonacute.   Depression 03/02/2019   Diabetes ([place])    Edema    Elevated liver function tests    Essential hypertension    Falls    Gastrointestinal hemorrhage    Hypercholesteremia    Hyperkalemia    Hyperlipidemia 04/23/2019   Hypertensive heart and chronic kidney disease with systolic congestive heart failure ([place])    Hypertensive heart  disease 03/02/2019   Hyperthyroidism    Hypokalemia    Hypokalemia    Hypomagnesemia    Hyponatremia    Iron deficiency anemia    Mild CAD    a. Coronary CT 04/2019 - Minimal, Non-obstructive CAD.   NICM (nonischemic cardiomyopathy) ([place])    Osteoarthritis 03/02/2019   PAF (paroxysmal atrial fibrillation) (HCC)    Pressure injury of skin 07/09/2019   Prolonged QT interval    Symptomatic anemia 06/28/2019    Past Surgical History:  Procedure Laterality Date   BIOPSY  06/29/2019   Procedure: BIOPSY;  Surgeon: Irene Shipper, MD;  Location: [place];  Service: Endoscopy;;   CARDIOVERSION N/A 04/29/2019   Procedure: CARDIOVERSION;  Surgeon: Sanda Klein, MD;  Location: [place] ENDOSCOPY;  Service: Cardiovascular;  Laterality: N/A;   CARDIOVERSION N/A 05/04/2019   Procedure: CARDIOVERSION;  Surgeon: Josue Hector, MD;  Location: [facility] ENDOSCOPY;  Service: Cardiovascular;  Laterality: N/A;   COLONOSCOPY WITH PROPOFOL N/A 07/12/2019   Procedure: COLONOSCOPY WITH PROPOFOL;  Surgeon: Doran Stabler, MD;  Location: [place];  Service: Gastroenterology;  Laterality: N/A;   ENTEROSCOPY N/A 07/10/2019   Procedure: ENTEROSCOPY;  Surgeon: Doran Stabler, MD;  Location: [place];  Service: Gastroenterology;  Laterality: N/A;   ESOPHAGOGASTRODUODENOSCOPY  06/29/2019   ESOPHAGOGASTRODUODENOSCOPY (EGD) WITH PROPOFOL N/A 06/29/2019   Procedure: ESOPHAGOGASTRODUODENOSCOPY (EGD) WITH PROPOFOL;  Surgeon: Irene Shipper, MD;  Location: [facility] ENDOSCOPY;  Service: Endoscopy;  Laterality: N/A;   GIVENS CAPSULE STUDY N/A 07/12/2019   Procedure: GIVENS CAPSULE STUDY;  Surgeon: Doran Stabler, MD;  Location: [place];  Service: Gastroenterology;  Laterality: N/A;   HEMOSTASIS CLIP PLACEMENT  07/12/2019   Procedure: HEMOSTASIS CLIP PLACEMENT;  Surgeon: Doran Stabler, MD;  Location: [place];  Service: Gastroenterology;;   POLYPECTOMY  07/12/2019   Procedure:  POLYPECTOMY;  Surgeon: Doran Stabler, MD;  Location: [place];  Service: Gastroenterology;;   SMALL BOWEL ENTEROSCOPY  07/10/2019   SUBMUCOSAL TATTOO INJECTION  07/10/2019   Procedure: SUBMUCOSAL TATTOO INJECTION;  Surgeon: Doran Stabler, MD;  Location: [place];  Service: Gastroenterology;;   TEE WITHOUT CARDIOVERSION  04/29/2019   TEE WITHOUT CARDIOVERSION N/A 04/29/2019   Procedure: TRANSESOPHAGEAL ECHOCARDIOGRAM (TEE);  Surgeon: Sanda Klein, MD;  Location[facility] END[facility]COPY;  Service: Cardiovascular;  Laterality: N/A;   TUBAL LIGATION      Current Medications: No outpatient medications have been marked as taking for the 03/24/20 encounter (Office Visit) with Richardo Priest, MD.     Allergies:   Ambien [zolpidem tartrate], Adhesive [tape], and Codeine   Social History   Socioeconomic History   Marital status: Divorced    Spouse name: Not on file   Number of children: Not on file   Years of education: Not on file   Highest education level: Not on file  Occupational History   Not on file  Tobacco Use   Smoking status: Former Smoker    Packs/day: 2.00    Years: 30.00  Pack years: 60.00    Types: Cigarettes    Quit date: 2001    Years since quitting: 20.6   Smokeless tobacco: Never Used  Scientific laboratory technician Use: Never used  Substance and Sexual Activity   Alcohol use: Never   Drug use: Never   Sexual activity: Not Currently  Other Topics Concern   Not on file  Social History Narrative   Not on file   Social Determinants of Health   Financial Resource Strain:    Difficulty of Paying Living Expenses:   Food Insecurity:    Worried About Charity fundraiser in the Last Year:    Arboriculturist in the Last Year:   Transportation Needs:    Film/video editor (Medical):    Lack of Transportation (Non-Medical):   Physical Activity:    Days of Exercise per Week:    Minutes of Exercise per Session:   Stress:    Feeling of  Stress :   Social Connections:    Frequency of Communication with Friends and Family:    Frequency of Social Gatherings with Friends and Family:    Attends Religious Services:    Active Member of Clubs or Organizations:    Attends Music therapist:    Marital Status:      Family History: The patient's family history includes Cancer in her mother; Heart disease in her father and mother; Hypercholesterolemia in her brother and mother; Osteoarthritis in her mother; Stroke in her mother. ROS:   Please see the history of present illness.    All other systems reviewed and are negative.  EKGs/Labs/Other Studies Reviewed:    The following studies were reviewed today:    Recent Labs: 07/09/2019: B Natriuretic Peptide 144.1 10/16/2019: NT-Pro BNP 449 03/06/2020: TSH 61.083 03/09/2020: Magnesium 2.0 03/10/2020: ALT 81 03/18/2020: BUN 36; Creatinine, Ser 1.44; Hemoglobin 9.1; Platelets 365; Potassium 4.5; Sodium 132  Recent Lipid Panel No results found for: CHOL, TRIG, HDL, CHOLHDL, VLDL, LDLCALC, LDLDIRECT  Physical Exam:    VS:  BP (!) 100/52    Pulse (!) 59    Ht 5\' 2"  (1.575 m)    LMP  (LMP Unknown) Comment: ?10 years ago?   SpO2 97%    BMI 38.41 kg/m     Wt Readings from Last 3 Encounters:  03/16/20 (!) 210 lb (95.3 kg)  12/25/19 151 lb 0.2 oz (68.5 kg)  10/16/19 145 lb (65.8 kg)     GEN: She looks chronically ill debilitated  in no acute distress HEENT: Normal NECK: No JVD; No carotid bruits LYMPHATICS: No lymphadenopathy CARDIAC: Irregular S1 variable RRR, no murmurs, rubs, gallops RESPIRATORY:  Clear to auscultation without rales, wheezing or rhonchi  ABDOMEN: Soft, non-tender, non-distended MUSCULOSKELETAL:  No edema; No deformity  SKIN: Warm and dry NEUROLOGIC:  Alert and oriented x 3 PSYCHIATRIC:  Normal affect    Signed, Shirlee More, MD  03/24/2020 4:17 PM    [facility]

## 2020-03-29 DIAGNOSIS — L02429 Furuncle of limb, unspecified: Secondary | ICD-10-CM | POA: Diagnosis not present

## 2020-04-04 DIAGNOSIS — K922 Gastrointestinal hemorrhage, unspecified: Secondary | ICD-10-CM | POA: Diagnosis not present

## 2020-04-04 DIAGNOSIS — R7989 Other specified abnormal findings of blood chemistry: Secondary | ICD-10-CM | POA: Diagnosis not present

## 2020-04-04 DIAGNOSIS — E875 Hyperkalemia: Secondary | ICD-10-CM | POA: Diagnosis not present

## 2020-04-04 DIAGNOSIS — I482 Chronic atrial fibrillation, unspecified: Secondary | ICD-10-CM | POA: Diagnosis not present

## 2020-04-04 DIAGNOSIS — I5022 Chronic systolic (congestive) heart failure: Secondary | ICD-10-CM | POA: Diagnosis not present

## 2020-04-04 DIAGNOSIS — I4892 Unspecified atrial flutter: Secondary | ICD-10-CM | POA: Diagnosis not present

## 2020-04-04 DIAGNOSIS — E7800 Pure hypercholesterolemia, unspecified: Secondary | ICD-10-CM | POA: Diagnosis not present

## 2020-04-04 DIAGNOSIS — R9431 Abnormal electrocardiogram [ECG] [EKG]: Secondary | ICD-10-CM | POA: Diagnosis not present

## 2020-04-04 DIAGNOSIS — J309 Allergic rhinitis, unspecified: Secondary | ICD-10-CM | POA: Diagnosis not present

## 2020-04-04 DIAGNOSIS — N189 Chronic kidney disease, unspecified: Secondary | ICD-10-CM | POA: Diagnosis not present

## 2020-04-05 DIAGNOSIS — Z716 Tobacco abuse counseling: Secondary | ICD-10-CM | POA: Diagnosis not present

## 2020-04-05 DIAGNOSIS — R69 Illness, unspecified: Secondary | ICD-10-CM | POA: Diagnosis not present

## 2020-04-05 DIAGNOSIS — Z79899 Other long term (current) drug therapy: Secondary | ICD-10-CM | POA: Diagnosis not present

## 2020-04-05 DIAGNOSIS — G8929 Other chronic pain: Secondary | ICD-10-CM | POA: Diagnosis not present

## 2020-04-07 DIAGNOSIS — E2740 Unspecified adrenocortical insufficiency: Secondary | ICD-10-CM | POA: Diagnosis not present

## 2020-04-07 DIAGNOSIS — G9340 Encephalopathy, unspecified: Secondary | ICD-10-CM | POA: Diagnosis not present

## 2020-04-07 DIAGNOSIS — I5020 Unspecified systolic (congestive) heart failure: Secondary | ICD-10-CM | POA: Diagnosis not present

## 2020-04-07 DIAGNOSIS — R202 Paresthesia of skin: Secondary | ICD-10-CM | POA: Diagnosis not present

## 2020-04-07 DIAGNOSIS — R69 Illness, unspecified: Secondary | ICD-10-CM | POA: Diagnosis not present

## 2020-04-07 DIAGNOSIS — I482 Chronic atrial fibrillation, unspecified: Secondary | ICD-10-CM | POA: Diagnosis not present

## 2020-04-07 DIAGNOSIS — R5381 Other malaise: Secondary | ICD-10-CM | POA: Diagnosis not present

## 2020-04-07 DIAGNOSIS — E039 Hypothyroidism, unspecified: Secondary | ICD-10-CM | POA: Diagnosis not present

## 2020-04-11 DIAGNOSIS — E875 Hyperkalemia: Secondary | ICD-10-CM | POA: Diagnosis not present

## 2020-04-11 DIAGNOSIS — N189 Chronic kidney disease, unspecified: Secondary | ICD-10-CM | POA: Diagnosis not present

## 2020-04-11 DIAGNOSIS — R7989 Other specified abnormal findings of blood chemistry: Secondary | ICD-10-CM | POA: Diagnosis not present

## 2020-04-11 DIAGNOSIS — I10 Essential (primary) hypertension: Secondary | ICD-10-CM | POA: Diagnosis not present

## 2020-04-11 DIAGNOSIS — E039 Hypothyroidism, unspecified: Secondary | ICD-10-CM | POA: Diagnosis not present

## 2020-04-11 DIAGNOSIS — E119 Type 2 diabetes mellitus without complications: Secondary | ICD-10-CM | POA: Diagnosis not present

## 2020-04-11 DIAGNOSIS — I5022 Chronic systolic (congestive) heart failure: Secondary | ICD-10-CM | POA: Diagnosis not present

## 2020-04-11 DIAGNOSIS — R9431 Abnormal electrocardiogram [ECG] [EKG]: Secondary | ICD-10-CM | POA: Diagnosis not present

## 2020-04-11 DIAGNOSIS — D509 Iron deficiency anemia, unspecified: Secondary | ICD-10-CM | POA: Diagnosis not present

## 2020-04-11 DIAGNOSIS — I482 Chronic atrial fibrillation, unspecified: Secondary | ICD-10-CM | POA: Diagnosis not present

## 2020-04-11 DIAGNOSIS — E7800 Pure hypercholesterolemia, unspecified: Secondary | ICD-10-CM | POA: Diagnosis not present

## 2020-04-11 DIAGNOSIS — N390 Urinary tract infection, site not specified: Secondary | ICD-10-CM | POA: Diagnosis not present

## 2020-04-11 DIAGNOSIS — I4892 Unspecified atrial flutter: Secondary | ICD-10-CM | POA: Diagnosis not present

## 2020-04-11 DIAGNOSIS — J309 Allergic rhinitis, unspecified: Secondary | ICD-10-CM | POA: Diagnosis not present

## 2020-04-12 DIAGNOSIS — E875 Hyperkalemia: Secondary | ICD-10-CM | POA: Diagnosis not present

## 2020-04-12 DIAGNOSIS — N189 Chronic kidney disease, unspecified: Secondary | ICD-10-CM | POA: Diagnosis not present

## 2020-04-12 DIAGNOSIS — R7989 Other specified abnormal findings of blood chemistry: Secondary | ICD-10-CM | POA: Diagnosis not present

## 2020-04-12 DIAGNOSIS — I4892 Unspecified atrial flutter: Secondary | ICD-10-CM | POA: Diagnosis not present

## 2020-04-12 DIAGNOSIS — E2740 Unspecified adrenocortical insufficiency: Secondary | ICD-10-CM | POA: Diagnosis not present

## 2020-04-12 DIAGNOSIS — E039 Hypothyroidism, unspecified: Secondary | ICD-10-CM | POA: Diagnosis not present

## 2020-04-12 DIAGNOSIS — J309 Allergic rhinitis, unspecified: Secondary | ICD-10-CM | POA: Diagnosis not present

## 2020-04-12 DIAGNOSIS — E7800 Pure hypercholesterolemia, unspecified: Secondary | ICD-10-CM | POA: Diagnosis not present

## 2020-04-12 DIAGNOSIS — I482 Chronic atrial fibrillation, unspecified: Secondary | ICD-10-CM | POA: Diagnosis not present

## 2020-04-12 DIAGNOSIS — I5022 Chronic systolic (congestive) heart failure: Secondary | ICD-10-CM | POA: Diagnosis not present

## 2020-04-13 ENCOUNTER — Other Ambulatory Visit: Payer: Self-pay

## 2020-04-13 ENCOUNTER — Ambulatory Visit (INDEPENDENT_AMBULATORY_CARE_PROVIDER_SITE_OTHER): Payer: Medicare HMO | Admitting: Internal Medicine

## 2020-04-13 ENCOUNTER — Encounter: Payer: Self-pay | Admitting: Internal Medicine

## 2020-04-13 VITALS — BP 110/60 | HR 54 | Ht 62.0 in

## 2020-04-13 DIAGNOSIS — E2749 Other adrenocortical insufficiency: Secondary | ICD-10-CM

## 2020-04-13 DIAGNOSIS — E559 Vitamin D deficiency, unspecified: Secondary | ICD-10-CM | POA: Diagnosis not present

## 2020-04-13 DIAGNOSIS — E1122 Type 2 diabetes mellitus with diabetic chronic kidney disease: Secondary | ICD-10-CM | POA: Diagnosis not present

## 2020-04-13 DIAGNOSIS — I429 Cardiomyopathy, unspecified: Secondary | ICD-10-CM | POA: Diagnosis not present

## 2020-04-13 DIAGNOSIS — I130 Hypertensive heart and chronic kidney disease with heart failure and stage 1 through stage 4 chronic kidney disease, or unspecified chronic kidney disease: Secondary | ICD-10-CM | POA: Diagnosis not present

## 2020-04-13 DIAGNOSIS — I5020 Unspecified systolic (congestive) heart failure: Secondary | ICD-10-CM | POA: Diagnosis not present

## 2020-04-13 DIAGNOSIS — E2740 Unspecified adrenocortical insufficiency: Secondary | ICD-10-CM | POA: Diagnosis not present

## 2020-04-13 DIAGNOSIS — N183 Chronic kidney disease, stage 3 unspecified: Secondary | ICD-10-CM | POA: Diagnosis not present

## 2020-04-13 DIAGNOSIS — E039 Hypothyroidism, unspecified: Secondary | ICD-10-CM | POA: Diagnosis not present

## 2020-04-13 DIAGNOSIS — N390 Urinary tract infection, site not specified: Secondary | ICD-10-CM | POA: Diagnosis not present

## 2020-04-13 DIAGNOSIS — I482 Chronic atrial fibrillation, unspecified: Secondary | ICD-10-CM | POA: Diagnosis not present

## 2020-04-13 DIAGNOSIS — G9340 Encephalopathy, unspecified: Secondary | ICD-10-CM | POA: Diagnosis not present

## 2020-04-13 DIAGNOSIS — I10 Essential (primary) hypertension: Secondary | ICD-10-CM | POA: Diagnosis not present

## 2020-04-13 DIAGNOSIS — E785 Hyperlipidemia, unspecified: Secondary | ICD-10-CM | POA: Diagnosis not present

## 2020-04-13 DIAGNOSIS — E0590 Thyrotoxicosis, unspecified without thyrotoxic crisis or storm: Secondary | ICD-10-CM | POA: Diagnosis not present

## 2020-04-13 NOTE — Patient Instructions (Signed)
-  Levothyroxine 100 mcg daily for now - Increase Hydrocortisone 5 mg, 3 tablets  with Breakfast and 1 tablet between 2-4 pm  - Please obtain a medical alert Bracelet " adrenal Insufficiency"  - ADRENAL INSUFFICIENCY SICK DAY RULES:  Should you face an extreme emotional or physical stress such as trauma, surgery or acute illness, this will require extra steroid coverage so that the body can meet that stress.   Without increasing the steroid dose you may experience severe weakness, headache, dizziness, nausea and vomiting and possibly a more serious deterioration in health.  Typically the dose of steroids will only need to be increased for a couple of days if you have an illness that is transient and managed in the community.   If you are unable to take/absorb an increased dose of steroids orally because of vomiting, you will urgently require steroid injections and should present to an Emergency Department.  The general advice for any serious illness is as follows: 1. Double the normal daily steroid dose for up to 3 days if you have a temperature of more than 37.50C (99.37F) with signs of sickness, or severe emotional or physical distress 2. Contact your primary care doctor and Endocrinologist if the illness worsens or it lasts for more than 3 days.  3. In cases of severe illness, urgent medical assistance should be promptly sought. 4. If you experience vomiting/diarrhea or are unable to take steroids by mouth, please administer the Hydrocortisone injection kit and seek urgent medical help.

## 2020-04-13 NOTE — Progress Notes (Signed)
Name: Brianna Porter  MRN/ DOB: [id number], [birth year]    Age/ Sex: 59 y.o., female    PCP: Cher Nakai, MD   Reason for Endocrinology Evaluation: Hypothyroidism and adrenal insufficiency     Date of Initial Endocrinology Evaluation: 04/13/2020     HPI: [name] is a 59 y.o. female with a past medical history of HTN, T2DM, A. Fib, and cardiomyopathy.  The patient presented for initial endocrinology clinic visit on 04/13/2020 for consultative assistance with her Hypothyroidism/ Adrenal Insufficiency     She is accompanied by her daughter [name]   She was discharged from rehab 04/08/2020  THYROID HISTORY:  She was diagnosed with hyperthyroidism in 2019, requiring Methimazole for ~ 2 years. Has been off Methimazole   Since ~ 09/2019  Pt has been diagnosed with hypothyroidism since 02/2020   Pt presented to the ED on 03/04/2020 with c/o lower extremity edema. She was noted to have an elevated TSH of 74 uIU/mL. Amiodarone was stopped at the time. She was started on LT -4 replacement.    ADRENAL HISTORY: She was also diagnosed with adrenal insufficiency during hospitalization in 02/2020. She had an abnormal cosyntropin stimulation test with a baseline cortisol of 1.4 ug/dL and a 60 minute cortisol level of 15 ug/dL   Of note the pt has been getting monthly corticosteroid injection of  left shoulder for approximately a year, not a candidate for surgery     HOME ENDOCRINE MEDICATIONS: Hydrocortisone 5 mg 5 mg BID  Levothyroxine 100 mcg daily       Weight is steady  Occasional dizziness  Has constipation  Has occasional nausea     HISTORY:  Past Medical History:  Past Medical History:  Diagnosis Date   Acute gastric ulcer with hemorrhage    Altered mental status 05/07/2019   Anxiety and depression    Atrial flutter (HCC)    CAD (coronary artery disease)    Chronic systolic CHF (congestive heart failure) ([place])    a. dx in setting of atrial fib/flutter  - possibly tachy mediated. Coronary CTA with only mild CAD in 04/2019.   CKD (chronic kidney disease) stage 3, GFR 30-59 ml/min 04/23/2019   Confusion    a. persistent confusion during 04/2019 admission of unclear cause. Home meds adjusted. CT/MRI brain nonacute.   Depression 03/02/2019   Diabetes ([place])    Edema    Elevated liver function tests    Essential hypertension    Falls    Gastrointestinal hemorrhage    Hypercholesteremia    Hyperkalemia    Hyperlipidemia 04/23/2019   Hypertensive heart and chronic kidney disease with systolic congestive heart failure ([place])    Hypertensive heart disease 03/02/2019   Hyperthyroidism    Hypokalemia    Hypokalemia    Hypomagnesemia    Hyponatremia    Iron deficiency anemia    Mild CAD    a. Coronary CT 04/2019 - Minimal, Non-obstructive CAD.   NICM (nonischemic cardiomyopathy) ([place])    Osteoarthritis 03/02/2019   PAF (paroxysmal atrial fibrillation) (HCC)    Pressure injury of skin 07/09/2019   Prolonged QT interval    Symptomatic anemia 06/28/2019   Past Surgical History:  Past Surgical History:  Procedure Laterality Date   BIOPSY  06/29/2019   Procedure: BIOPSY;  Surgeon: Irene Shipper, MD;  Location: [place];  Service: Endoscopy;;   CARDIOVERSION N/A 04/29/2019   Procedure: CARDIOVERSION;  Surgeon: Sanda Klein, MD;  Location: [place];  Service: Cardiovascular;  Laterality: N/A;   CARDIOVERSION N/A 05/04/2019   Procedure: CARDIOVERSION;  Surgeon: Josue Hector, MD;  Location: [facility] ENDOSCOPY;  Service: Cardiovascular;  Laterality: N/A;   COLONOSCOPY WITH PROPOFOL N/A 07/12/2019   Procedure: COLONOSCOPY WITH PROPOFOL;  Surgeon: Doran Stabler, MD;  Location: [place];  Service: Gastroenterology;  Laterality: N/A;   ENTEROSCOPY N/A 07/10/2019   Procedure: ENTEROSCOPY;  Surgeon: Doran Stabler, MD;  Location: [place];  Service: Gastroenterology;  Laterality: N/A;    ESOPHAGOGASTRODUODENOSCOPY  06/29/2019   ESOPHAGOGASTRODUODENOSCOPY (EGD) WITH PROPOFOL N/A 06/29/2019   Procedure: ESOPHAGOGASTRODUODENOSCOPY (EGD) WITH PROPOFOL;  Surgeon: Irene Shipper, MD;  Location: [facility] ENDOSCOPY;  Service: Endoscopy;  Laterality: N/A;   GIVENS CAPSULE STUDY N/A 07/12/2019   Procedure: GIVENS CAPSULE STUDY;  Surgeon: Doran Stabler, MD;  Location: [place];  Service: Gastroenterology;  Laterality: N/A;   HEMOSTASIS CLIP PLACEMENT  07/12/2019   Procedure: HEMOSTASIS CLIP PLACEMENT;  Surgeon: Doran Stabler, MD;  Location: [place];  Service: Gastroenterology;;   POLYPECTOMY  07/12/2019   Procedure: POLYPECTOMY;  Surgeon: Doran Stabler, MD;  Location: [place];  Service: Gastroenterology;;   SMALL BOWEL ENTEROSCOPY  07/10/2019   SUBMUCOSAL TATTOO INJECTION  07/10/2019   Procedure: SUBMUCOSAL TATTOO INJECTION;  Surgeon: Doran Stabler, MD;  Location: [place];  Service: Gastroenterology;;   TEE WITHOUT CARDIOVERSION  04/29/2019   TEE WITHOUT CARDIOVERSION N/A 04/29/2019   Procedure: TRANSESOPHAGEAL ECHOCARDIOGRAM (TEE);  Surgeon: Sanda Klein, MD;  Location: [facility] ENDOSCOPY;  Service: Cardiovascular;  Laterality: N/A;   TUBAL LIGATION        Social History:  reports that she quit smoking about 20 years ago. Her smoking use included cigarettes. She has a 60.00 pack-year smoking history. She has never used smokeless tobacco. She reports that she does not drink alcohol and does not use drugs.  Family History: family history includes Cancer in her mother; Heart disease in her father and mother; Hypercholesterolemia in her brother and mother; Osteoarthritis in her mother; Stroke in her mother.   HOME MEDICATIONS: Allergies as of 04/13/2020      Reactions   Ambien [zolpidem Tartrate]    Pt and family state this med makes the patient sleepwalk.   Adhesive [tape]    Tears skin, please use paper tape   Codeine Nausea And Vomiting        Medication List       Accurate as of April 13, 2020  2:23 PM. If you have any questions, ask your nurse or doctor.        STOP taking these medications   oxyCODONE 5 MG immediate release tablet Commonly known as: Oxy IR/ROXICODONE Stopped by: Dorita Sciara, MD     TAKE these medications   acetaminophen 325 MG tablet Commonly known as: TYLENOL Take 650 mg by mouth every 6 (six) hours as needed for mild pain.   albuterol 108 (90 Base) MCG/ACT inhaler Commonly known as: VENTOLIN HFA Inhale 2 puffs into the lungs every 6 (six) hours as needed for wheezing or shortness of breath.   ALPRAZolam 0.5 MG tablet Commonly known as: XANAX Take 1 tablet (0.5 mg total) by mouth 3 (three) times daily as needed for anxiety or sleep.   apixaban 5 MG Tabs tablet Commonly known as: ELIQUIS Take 1 tablet (5 mg total) by mouth 2 (two) times daily. What changed: when to take this   buPROPion 150 MG 24 hr tablet Commonly known as: WELLBUTRIN  XL Take 150 mg by mouth at bedtime.   cetirizine 10 MG tablet Commonly known as: ZYRTEC Take 10 mg by mouth daily as needed for allergies.   cholecalciferol 25 MCG (1000 UNIT) tablet Commonly known as: VITAMIN D3 Take 1,000 Units by mouth daily.   DULoxetine 60 MG capsule Commonly known as: CYMBALTA Take 1 capsule (60 mg total) by mouth 2 (two) times daily.   fenofibrate 48 MG tablet Commonly known as: TRICOR Take 48 mg by mouth at bedtime.   ferrous sulfate 325 (65 FE) MG tablet Take 325 mg by mouth every other day.   furosemide 40 MG tablet Commonly known as: LASIX Take 1 tablet by mouth twice daily What changed: when to take this   gabapentin 400 MG capsule Commonly known as: NEURONTIN Take 400 mg by mouth 3 (three) times daily. What changed: Another medication with the same name was removed. Continue taking this medication, and follow the directions you see here. Changed by: Dorita Sciara, MD   hydrocortisone 5 MG  tablet Commonly known as: CORTEF Take 1 tablet (5 mg total) by mouth 2 (two) times daily.   ICY HOT ADVANCED RELIEF EX Apply 1 application topically 3 (three) times daily as needed (pain).   irbesartan 150 MG tablet Commonly known as: AVAPRO Take 1 tablet (150 mg total) by mouth daily.   levothyroxine 100 MCG tablet Commonly known as: SYNTHROID Take 1 tablet (100 mcg total) by mouth daily at 6 (six) AM.   meclizine 12.5 MG tablet Commonly known as: ANTIVERT Take 1 tablet (12.5 mg total) by mouth 3 (three) times daily as needed for dizziness.   metoprolol succinate 100 MG 24 hr tablet Commonly known as: TOPROL-XL Take 1 tablet (100 mg total) by mouth daily. Take with or immediately following a meal.   OVER THE COUNTER MEDICATION Apply 1 application topically daily as needed (pain).   pantoprazole 40 MG tablet Commonly known as: PROTONIX Take 40 mg by mouth 2 (two) times daily.   polyethylene glycol 17 g packet Commonly known as: MIRALAX / GLYCOLAX Take 17 g by mouth at bedtime.   potassium chloride SA 20 MEQ tablet Commonly known as: KLOR-CON Take 1 tablet (20 mEq total) by mouth 2 (two) times daily. What changed: when to take this   promethazine 25 MG tablet Commonly known as: PHENERGAN Take 25 mg by mouth every 6 (six) hours as needed for nausea or vomiting.   tetrahydrozoline-zinc 0.05-0.25 % ophthalmic solution Commonly known as: VISINE-AC Place 2 drops into both eyes 3 (three) times daily as needed (dry eyes).   traMADol 50 MG tablet Commonly known as: ULTRAM Take 50 mg by mouth every 6 (six) hours as needed.   vitamin C 1000 MG tablet Take 1,000 mg by mouth daily.         REVIEW OF SYSTEMS: A comprehensive ROS was conducted with the patient and is negative except as per HPI     OBJECTIVE:  VS: Ht 5\' 2"  (1.575 m)    LMP  (LMP Unknown) Comment: ?10 years ago?   BMI 38.41 kg/m     Body surface area is 2.04 meters squared.  Wt Readings from Last  3 Encounters:  03/16/20 (!) 210 lb (95.3 kg)  12/25/19 151 lb 0.2 oz (68.5 kg)  10/16/19 145 lb (65.8 kg)     EXAM: General: Pt appears well and is in NAD  Neck: General: Supple without adenopathy. Thyroid: Thyroid size normal.  No goiter or nodules appreciated.  No thyroid bruit.  Lungs: Clear with good BS bilat with no rales, rhonchi, or wheezes  Heart: Auscultation: RRR.  Abdomen: Normoactive bowel sounds, soft, nontender, without masses or organomegaly palpable  Extremities:  BL LE: No pretibial edema normal ROM and strength.  Skin: Hair: Texture and amount normal with gender appropriate distribution Skin Inspection: No rashes Skin Palpation: Skin temperature, texture, and thickness normal to palpation  Neuro: Cranial nerves: II - XII grossly intact  Motor: Normal strength throughout DTRs: 2+ and symmetric in UE without delay in relaxation phase  Mental Status: Judgment, insight: Intact Orientation: Oriented to time, place, and person Mood and affect: No depression, anxiety, or agitation     DATA REVIEWED: Results for MAI, LONGNECKER (MRN [id number]) as of 04/15/2020 08:52  Ref. Range 04/13/2020 15:04  Sodium Latest Ref Range: 135 - 146 mmol/L 129 (L)  Potassium Latest Ref Range: 3.5 - 5.3 mmol/L 3.7  Chloride Latest Ref Range: 98 - 110 mmol/L 96 (L)  CO2 Latest Ref Range: 20 - 32 mmol/L 22  Glucose Latest Ref Range: 65 - 99 mg/dL 168 (H)  BUN Latest Ref Range: 7 - 25 mg/dL 25  Creatinine Latest Ref Range: 0.50 - 1.05 mg/dL 1.70 (H)  Calcium Latest Ref Range: 8.6 - 10.4 mg/dL 8.9  BUN/Creatinine Ratio Latest Ref Range: 6 - 22 (calc) 15  Vitamin D, 25-Hydroxy Latest Ref Range: 30 - 100 ng/mL 12 (L)  TSH Latest Ref Range: 0.40 - 4.50 mIU/L 28.35 (H)  T4,Free(Direct) Latest Ref Range: 0.8 - 1.8 ng/dL 0.9      ASSESSMENT/PLAN/RECOMMENDATIONS:   1. Hypothyroidism  - Pt was initially diagnosed with Hyperthyroidism sometimes in 2018/2019 requiring thionamide therapy. IN  09/2019 she was noted with elevated TSH while on Methimazole which was held but the pt continued with hypothyroidism despite being off thionamide therapy for ~ 6 months - Pt educated extensively on the correct way to take levothyroxine (first thing in the morning with water, 30 minutes before eating or taking other medications). - Pt encouraged to double dose the following day if she were to miss a dose given long half-life of levothyroxine. -TSH improving but continues to be high, will adjust as below    Medications : STOP Levothyroxine 100 mcg daily  Start Levothyroxine 112 mcg daily    2. Adrenal Insufficiency:  - Most likely secondary to chronic steroid intake through intra-articular injections.  - Will adjust HC dose as below based on basal surface area - Unfortunately she has chronic left shoulder pain, not a candidate for surgery and this is deemed to be a chronic . No point in trying to wean off physiologic dose of steroids since she will continue to have these monthly injections.  - Pt advised to obtain a medical alert bracelet - Discussed sick day rule    Medications: HC 5 mg, 3 tabs with Breakfast and 1 tab wit    3. Vitamin D deficiency:   - Will replenish with ergocalciferol 50,000 iu weekly . Pt will switch to OTC vitamin D 1000 iu daily after the prescription is complete.    Labs in 6 weeks  F/U in 3 months     Signed electronically by: Mack Guise, MD  [facility] Endocrinology  [facility] [address] [place], [place] [address] Phone: [phone] FAX: [phone]   CC: Cher Nakai, [place] [address] Phone: [phone] Fax: [phone]   Return to Endocrinology clinic as  below: Future Appointments  Date Time Provider [place]  05/02/2020  3:45 PM Constance Haw, MD [facility] None  06/28/2020  3:00 PM Bettina Gavia Hilton Cork, MD [facility] None

## 2020-04-14 LAB — BASIC METABOLIC PANEL
BUN/Creatinine Ratio: 15 (calc) (ref 6–22)
BUN: 25 mg/dL (ref 7–25)
CO2: 22 mmol/L (ref 20–32)
Calcium: 8.9 mg/dL (ref 8.6–10.4)
Chloride: 96 mmol/L — ABNORMAL LOW (ref 98–110)
Creat: 1.7 mg/dL — ABNORMAL HIGH (ref 0.50–1.05)
Glucose, Bld: 168 mg/dL — ABNORMAL HIGH (ref 65–99)
Potassium: 3.7 mmol/L (ref 3.5–5.3)
Sodium: 129 mmol/L — ABNORMAL LOW (ref 135–146)

## 2020-04-14 LAB — T4, FREE: Free T4: 0.9 ng/dL (ref 0.8–1.8)

## 2020-04-14 LAB — VITAMIN D 25 HYDROXY (VIT D DEFICIENCY, FRACTURES): Vit D, 25-Hydroxy: 12 ng/mL — ABNORMAL LOW (ref 30–100)

## 2020-04-14 LAB — TSH: TSH: 28.35 mIU/L — ABNORMAL HIGH (ref 0.40–4.50)

## 2020-04-15 DIAGNOSIS — E039 Hypothyroidism, unspecified: Secondary | ICD-10-CM

## 2020-04-15 DIAGNOSIS — N390 Urinary tract infection, site not specified: Secondary | ICD-10-CM | POA: Diagnosis not present

## 2020-04-15 DIAGNOSIS — E2749 Other adrenocortical insufficiency: Secondary | ICD-10-CM

## 2020-04-15 DIAGNOSIS — N183 Chronic kidney disease, stage 3 unspecified: Secondary | ICD-10-CM | POA: Diagnosis not present

## 2020-04-15 DIAGNOSIS — E1122 Type 2 diabetes mellitus with diabetic chronic kidney disease: Secondary | ICD-10-CM | POA: Diagnosis not present

## 2020-04-15 DIAGNOSIS — I5020 Unspecified systolic (congestive) heart failure: Secondary | ICD-10-CM | POA: Diagnosis not present

## 2020-04-15 DIAGNOSIS — E2740 Unspecified adrenocortical insufficiency: Secondary | ICD-10-CM | POA: Diagnosis not present

## 2020-04-15 DIAGNOSIS — E559 Vitamin D deficiency, unspecified: Secondary | ICD-10-CM | POA: Insufficient documentation

## 2020-04-15 DIAGNOSIS — I429 Cardiomyopathy, unspecified: Secondary | ICD-10-CM | POA: Diagnosis not present

## 2020-04-15 DIAGNOSIS — I482 Chronic atrial fibrillation, unspecified: Secondary | ICD-10-CM | POA: Diagnosis not present

## 2020-04-15 DIAGNOSIS — G9340 Encephalopathy, unspecified: Secondary | ICD-10-CM | POA: Diagnosis not present

## 2020-04-15 DIAGNOSIS — I130 Hypertensive heart and chronic kidney disease with heart failure and stage 1 through stage 4 chronic kidney disease, or unspecified chronic kidney disease: Secondary | ICD-10-CM | POA: Diagnosis not present

## 2020-04-15 HISTORY — DX: Hypothyroidism, unspecified: E03.9

## 2020-04-15 HISTORY — DX: Vitamin D deficiency, unspecified: E55.9

## 2020-04-15 HISTORY — DX: Other adrenocortical insufficiency: E27.49

## 2020-04-15 MED ORDER — ERGOCALCIFEROL 1.25 MG (50000 UT) PO CAPS: 50000.0000 [IU] | ORAL_CAPSULE | ORAL | 0 refills | Status: DC

## 2020-04-15 MED ORDER — LEVOTHYROXINE SODIUM 112 MCG PO TABS
112.0000 ug | ORAL_TABLET | Freq: Every day | ORAL | 4 refills | Status: DC
Start: 2020-04-15 — End: 2021-03-14

## 2020-04-15 MED ORDER — HYDROCORTISONE 5 MG PO TABS: 5.0000 mg | ORAL_TABLET | ORAL | 3 refills | Status: DC

## 2020-04-16 DIAGNOSIS — I130 Hypertensive heart and chronic kidney disease with heart failure and stage 1 through stage 4 chronic kidney disease, or unspecified chronic kidney disease: Secondary | ICD-10-CM | POA: Diagnosis not present

## 2020-04-16 DIAGNOSIS — E039 Hypothyroidism, unspecified: Secondary | ICD-10-CM | POA: Diagnosis not present

## 2020-04-16 DIAGNOSIS — I429 Cardiomyopathy, unspecified: Secondary | ICD-10-CM | POA: Diagnosis not present

## 2020-04-16 DIAGNOSIS — N183 Chronic kidney disease, stage 3 unspecified: Secondary | ICD-10-CM | POA: Diagnosis not present

## 2020-04-16 DIAGNOSIS — E1122 Type 2 diabetes mellitus with diabetic chronic kidney disease: Secondary | ICD-10-CM | POA: Diagnosis not present

## 2020-04-16 DIAGNOSIS — N390 Urinary tract infection, site not specified: Secondary | ICD-10-CM | POA: Diagnosis not present

## 2020-04-16 DIAGNOSIS — G9340 Encephalopathy, unspecified: Secondary | ICD-10-CM | POA: Diagnosis not present

## 2020-04-16 DIAGNOSIS — I5020 Unspecified systolic (congestive) heart failure: Secondary | ICD-10-CM | POA: Diagnosis not present

## 2020-04-16 DIAGNOSIS — E2740 Unspecified adrenocortical insufficiency: Secondary | ICD-10-CM | POA: Diagnosis not present

## 2020-04-16 DIAGNOSIS — I482 Chronic atrial fibrillation, unspecified: Secondary | ICD-10-CM | POA: Diagnosis not present

## 2020-04-19 DIAGNOSIS — I5020 Unspecified systolic (congestive) heart failure: Secondary | ICD-10-CM | POA: Diagnosis not present

## 2020-04-19 DIAGNOSIS — N390 Urinary tract infection, site not specified: Secondary | ICD-10-CM | POA: Diagnosis not present

## 2020-04-19 DIAGNOSIS — I482 Chronic atrial fibrillation, unspecified: Secondary | ICD-10-CM | POA: Diagnosis not present

## 2020-04-19 DIAGNOSIS — E1122 Type 2 diabetes mellitus with diabetic chronic kidney disease: Secondary | ICD-10-CM | POA: Diagnosis not present

## 2020-04-19 DIAGNOSIS — N183 Chronic kidney disease, stage 3 unspecified: Secondary | ICD-10-CM | POA: Diagnosis not present

## 2020-04-19 DIAGNOSIS — E2740 Unspecified adrenocortical insufficiency: Secondary | ICD-10-CM | POA: Diagnosis not present

## 2020-04-19 DIAGNOSIS — I130 Hypertensive heart and chronic kidney disease with heart failure and stage 1 through stage 4 chronic kidney disease, or unspecified chronic kidney disease: Secondary | ICD-10-CM | POA: Diagnosis not present

## 2020-04-19 DIAGNOSIS — G9340 Encephalopathy, unspecified: Secondary | ICD-10-CM | POA: Diagnosis not present

## 2020-04-19 DIAGNOSIS — E039 Hypothyroidism, unspecified: Secondary | ICD-10-CM | POA: Diagnosis not present

## 2020-04-19 DIAGNOSIS — I429 Cardiomyopathy, unspecified: Secondary | ICD-10-CM | POA: Diagnosis not present

## 2020-04-20 DIAGNOSIS — N183 Chronic kidney disease, stage 3 unspecified: Secondary | ICD-10-CM | POA: Diagnosis not present

## 2020-04-20 DIAGNOSIS — E1122 Type 2 diabetes mellitus with diabetic chronic kidney disease: Secondary | ICD-10-CM | POA: Diagnosis not present

## 2020-04-20 DIAGNOSIS — I482 Chronic atrial fibrillation, unspecified: Secondary | ICD-10-CM | POA: Diagnosis not present

## 2020-04-20 DIAGNOSIS — G9340 Encephalopathy, unspecified: Secondary | ICD-10-CM | POA: Diagnosis not present

## 2020-04-20 DIAGNOSIS — E2740 Unspecified adrenocortical insufficiency: Secondary | ICD-10-CM | POA: Diagnosis not present

## 2020-04-20 DIAGNOSIS — I5020 Unspecified systolic (congestive) heart failure: Secondary | ICD-10-CM | POA: Diagnosis not present

## 2020-04-20 DIAGNOSIS — E039 Hypothyroidism, unspecified: Secondary | ICD-10-CM | POA: Diagnosis not present

## 2020-04-20 DIAGNOSIS — I429 Cardiomyopathy, unspecified: Secondary | ICD-10-CM | POA: Diagnosis not present

## 2020-04-20 DIAGNOSIS — I130 Hypertensive heart and chronic kidney disease with heart failure and stage 1 through stage 4 chronic kidney disease, or unspecified chronic kidney disease: Secondary | ICD-10-CM | POA: Diagnosis not present

## 2020-04-20 DIAGNOSIS — N390 Urinary tract infection, site not specified: Secondary | ICD-10-CM | POA: Diagnosis not present

## 2020-04-22 DIAGNOSIS — E039 Hypothyroidism, unspecified: Secondary | ICD-10-CM | POA: Diagnosis not present

## 2020-04-22 DIAGNOSIS — N390 Urinary tract infection, site not specified: Secondary | ICD-10-CM | POA: Diagnosis not present

## 2020-04-22 DIAGNOSIS — N183 Chronic kidney disease, stage 3 unspecified: Secondary | ICD-10-CM | POA: Diagnosis not present

## 2020-04-22 DIAGNOSIS — I130 Hypertensive heart and chronic kidney disease with heart failure and stage 1 through stage 4 chronic kidney disease, or unspecified chronic kidney disease: Secondary | ICD-10-CM | POA: Diagnosis not present

## 2020-04-22 DIAGNOSIS — G9340 Encephalopathy, unspecified: Secondary | ICD-10-CM | POA: Diagnosis not present

## 2020-04-22 DIAGNOSIS — I5020 Unspecified systolic (congestive) heart failure: Secondary | ICD-10-CM | POA: Diagnosis not present

## 2020-04-22 DIAGNOSIS — E1122 Type 2 diabetes mellitus with diabetic chronic kidney disease: Secondary | ICD-10-CM | POA: Diagnosis not present

## 2020-04-22 DIAGNOSIS — I429 Cardiomyopathy, unspecified: Secondary | ICD-10-CM | POA: Diagnosis not present

## 2020-04-22 DIAGNOSIS — E2740 Unspecified adrenocortical insufficiency: Secondary | ICD-10-CM | POA: Diagnosis not present

## 2020-04-22 DIAGNOSIS — I482 Chronic atrial fibrillation, unspecified: Secondary | ICD-10-CM | POA: Diagnosis not present

## 2020-04-23 DIAGNOSIS — E1122 Type 2 diabetes mellitus with diabetic chronic kidney disease: Secondary | ICD-10-CM | POA: Diagnosis not present

## 2020-04-27 DIAGNOSIS — I482 Chronic atrial fibrillation, unspecified: Secondary | ICD-10-CM | POA: Diagnosis not present

## 2020-04-27 DIAGNOSIS — J309 Allergic rhinitis, unspecified: Secondary | ICD-10-CM | POA: Diagnosis not present

## 2020-04-27 DIAGNOSIS — N189 Chronic kidney disease, unspecified: Secondary | ICD-10-CM | POA: Diagnosis not present

## 2020-04-27 DIAGNOSIS — E875 Hyperkalemia: Secondary | ICD-10-CM | POA: Diagnosis not present

## 2020-04-27 DIAGNOSIS — I5022 Chronic systolic (congestive) heart failure: Secondary | ICD-10-CM | POA: Diagnosis not present

## 2020-04-27 DIAGNOSIS — R7989 Other specified abnormal findings of blood chemistry: Secondary | ICD-10-CM | POA: Diagnosis not present

## 2020-04-27 DIAGNOSIS — R0600 Dyspnea, unspecified: Secondary | ICD-10-CM | POA: Diagnosis not present

## 2020-04-27 DIAGNOSIS — I4892 Unspecified atrial flutter: Secondary | ICD-10-CM | POA: Diagnosis not present

## 2020-04-27 DIAGNOSIS — K922 Gastrointestinal hemorrhage, unspecified: Secondary | ICD-10-CM | POA: Diagnosis not present

## 2020-04-27 DIAGNOSIS — R9431 Abnormal electrocardiogram [ECG] [EKG]: Secondary | ICD-10-CM | POA: Diagnosis not present

## 2020-04-28 DIAGNOSIS — N390 Urinary tract infection, site not specified: Secondary | ICD-10-CM | POA: Diagnosis not present

## 2020-04-28 DIAGNOSIS — E039 Hypothyroidism, unspecified: Secondary | ICD-10-CM | POA: Diagnosis not present

## 2020-04-28 DIAGNOSIS — N183 Chronic kidney disease, stage 3 unspecified: Secondary | ICD-10-CM | POA: Diagnosis not present

## 2020-04-28 DIAGNOSIS — I130 Hypertensive heart and chronic kidney disease with heart failure and stage 1 through stage 4 chronic kidney disease, or unspecified chronic kidney disease: Secondary | ICD-10-CM | POA: Diagnosis not present

## 2020-04-28 DIAGNOSIS — E2740 Unspecified adrenocortical insufficiency: Secondary | ICD-10-CM | POA: Diagnosis not present

## 2020-04-28 DIAGNOSIS — G9340 Encephalopathy, unspecified: Secondary | ICD-10-CM | POA: Diagnosis not present

## 2020-04-28 DIAGNOSIS — E1122 Type 2 diabetes mellitus with diabetic chronic kidney disease: Secondary | ICD-10-CM | POA: Diagnosis not present

## 2020-04-28 DIAGNOSIS — I482 Chronic atrial fibrillation, unspecified: Secondary | ICD-10-CM | POA: Diagnosis not present

## 2020-04-28 DIAGNOSIS — I5020 Unspecified systolic (congestive) heart failure: Secondary | ICD-10-CM | POA: Diagnosis not present

## 2020-04-28 DIAGNOSIS — I429 Cardiomyopathy, unspecified: Secondary | ICD-10-CM | POA: Diagnosis not present

## 2020-04-29 DIAGNOSIS — N183 Chronic kidney disease, stage 3 unspecified: Secondary | ICD-10-CM | POA: Diagnosis not present

## 2020-04-29 DIAGNOSIS — I130 Hypertensive heart and chronic kidney disease with heart failure and stage 1 through stage 4 chronic kidney disease, or unspecified chronic kidney disease: Secondary | ICD-10-CM | POA: Diagnosis not present

## 2020-04-29 DIAGNOSIS — E1122 Type 2 diabetes mellitus with diabetic chronic kidney disease: Secondary | ICD-10-CM | POA: Diagnosis not present

## 2020-04-29 DIAGNOSIS — N390 Urinary tract infection, site not specified: Secondary | ICD-10-CM | POA: Diagnosis not present

## 2020-04-29 DIAGNOSIS — E039 Hypothyroidism, unspecified: Secondary | ICD-10-CM | POA: Diagnosis not present

## 2020-04-29 DIAGNOSIS — I429 Cardiomyopathy, unspecified: Secondary | ICD-10-CM | POA: Diagnosis not present

## 2020-04-29 DIAGNOSIS — G9340 Encephalopathy, unspecified: Secondary | ICD-10-CM | POA: Diagnosis not present

## 2020-04-29 DIAGNOSIS — E2740 Unspecified adrenocortical insufficiency: Secondary | ICD-10-CM | POA: Diagnosis not present

## 2020-04-29 DIAGNOSIS — I5020 Unspecified systolic (congestive) heart failure: Secondary | ICD-10-CM | POA: Diagnosis not present

## 2020-04-29 DIAGNOSIS — I482 Chronic atrial fibrillation, unspecified: Secondary | ICD-10-CM | POA: Diagnosis not present

## 2020-05-02 ENCOUNTER — Telehealth: Payer: Self-pay | Admitting: Cardiology

## 2020-05-02 ENCOUNTER — Ambulatory Visit: Payer: Medicare HMO | Admitting: Cardiology

## 2020-05-02 NOTE — Telephone Encounter (Signed)
Patient returning Sherri's call.

## 2020-05-02 NOTE — Telephone Encounter (Signed)
Called pt to discuss scheduling follow up with Dr. Curt Bears, to further discuss ablation. Pt does not think she wants a procedure anymore.  States she has been through so much recently (hospital/rehab 2 months) and does not want to deal with more. States she would like to see her kidney doctor before following up with [facility].  Made aware that I would follow up in couple weeks to see about getting her rescheduled. She is agreeable to plan

## 2020-05-03 DIAGNOSIS — G9340 Encephalopathy, unspecified: Secondary | ICD-10-CM | POA: Diagnosis not present

## 2020-05-03 DIAGNOSIS — I429 Cardiomyopathy, unspecified: Secondary | ICD-10-CM | POA: Diagnosis not present

## 2020-05-03 DIAGNOSIS — E1122 Type 2 diabetes mellitus with diabetic chronic kidney disease: Secondary | ICD-10-CM | POA: Diagnosis not present

## 2020-05-03 DIAGNOSIS — N183 Chronic kidney disease, stage 3 unspecified: Secondary | ICD-10-CM | POA: Diagnosis not present

## 2020-05-03 DIAGNOSIS — E039 Hypothyroidism, unspecified: Secondary | ICD-10-CM | POA: Diagnosis not present

## 2020-05-03 DIAGNOSIS — E2740 Unspecified adrenocortical insufficiency: Secondary | ICD-10-CM | POA: Diagnosis not present

## 2020-05-03 DIAGNOSIS — N390 Urinary tract infection, site not specified: Secondary | ICD-10-CM | POA: Diagnosis not present

## 2020-05-03 DIAGNOSIS — I130 Hypertensive heart and chronic kidney disease with heart failure and stage 1 through stage 4 chronic kidney disease, or unspecified chronic kidney disease: Secondary | ICD-10-CM | POA: Diagnosis not present

## 2020-05-03 DIAGNOSIS — I482 Chronic atrial fibrillation, unspecified: Secondary | ICD-10-CM | POA: Diagnosis not present

## 2020-05-03 DIAGNOSIS — I5020 Unspecified systolic (congestive) heart failure: Secondary | ICD-10-CM | POA: Diagnosis not present

## 2020-05-04 ENCOUNTER — Telehealth: Payer: Self-pay

## 2020-05-04 DIAGNOSIS — I429 Cardiomyopathy, unspecified: Secondary | ICD-10-CM | POA: Diagnosis not present

## 2020-05-04 DIAGNOSIS — N183 Chronic kidney disease, stage 3 unspecified: Secondary | ICD-10-CM | POA: Diagnosis not present

## 2020-05-04 DIAGNOSIS — N189 Chronic kidney disease, unspecified: Secondary | ICD-10-CM | POA: Diagnosis not present

## 2020-05-04 DIAGNOSIS — J309 Allergic rhinitis, unspecified: Secondary | ICD-10-CM | POA: Diagnosis not present

## 2020-05-04 DIAGNOSIS — E875 Hyperkalemia: Secondary | ICD-10-CM | POA: Diagnosis not present

## 2020-05-04 DIAGNOSIS — R9431 Abnormal electrocardiogram [ECG] [EKG]: Secondary | ICD-10-CM | POA: Diagnosis not present

## 2020-05-04 DIAGNOSIS — I482 Chronic atrial fibrillation, unspecified: Secondary | ICD-10-CM | POA: Diagnosis not present

## 2020-05-04 DIAGNOSIS — I5020 Unspecified systolic (congestive) heart failure: Secondary | ICD-10-CM | POA: Diagnosis not present

## 2020-05-04 DIAGNOSIS — I130 Hypertensive heart and chronic kidney disease with heart failure and stage 1 through stage 4 chronic kidney disease, or unspecified chronic kidney disease: Secondary | ICD-10-CM | POA: Diagnosis not present

## 2020-05-04 DIAGNOSIS — L0390 Cellulitis, unspecified: Secondary | ICD-10-CM | POA: Diagnosis not present

## 2020-05-04 DIAGNOSIS — N390 Urinary tract infection, site not specified: Secondary | ICD-10-CM | POA: Diagnosis not present

## 2020-05-04 DIAGNOSIS — G9340 Encephalopathy, unspecified: Secondary | ICD-10-CM | POA: Diagnosis not present

## 2020-05-04 DIAGNOSIS — E1122 Type 2 diabetes mellitus with diabetic chronic kidney disease: Secondary | ICD-10-CM | POA: Diagnosis not present

## 2020-05-04 DIAGNOSIS — E2740 Unspecified adrenocortical insufficiency: Secondary | ICD-10-CM | POA: Diagnosis not present

## 2020-05-04 DIAGNOSIS — R7989 Other specified abnormal findings of blood chemistry: Secondary | ICD-10-CM | POA: Diagnosis not present

## 2020-05-04 DIAGNOSIS — I4892 Unspecified atrial flutter: Secondary | ICD-10-CM | POA: Diagnosis not present

## 2020-05-04 DIAGNOSIS — I5022 Chronic systolic (congestive) heart failure: Secondary | ICD-10-CM | POA: Diagnosis not present

## 2020-05-04 DIAGNOSIS — E039 Hypothyroidism, unspecified: Secondary | ICD-10-CM | POA: Diagnosis not present

## 2020-05-04 DIAGNOSIS — R2681 Unsteadiness on feet: Secondary | ICD-10-CM | POA: Diagnosis not present

## 2020-05-04 NOTE — Telephone Encounter (Signed)
Per message from Dr. Bettina Gavia:  Please contact her I would like to place her on a minimal dose of the statin rosuvastatin 20 mg once weekly with her CAD I received a note from the pharmacist that she is not on statin   I tried calling the patient to discuss this with her but she did not answer. Left message on patients voicemail to please return our call.

## 2020-05-06 DIAGNOSIS — I130 Hypertensive heart and chronic kidney disease with heart failure and stage 1 through stage 4 chronic kidney disease, or unspecified chronic kidney disease: Secondary | ICD-10-CM | POA: Diagnosis not present

## 2020-05-06 DIAGNOSIS — G9340 Encephalopathy, unspecified: Secondary | ICD-10-CM | POA: Diagnosis not present

## 2020-05-06 DIAGNOSIS — I5020 Unspecified systolic (congestive) heart failure: Secondary | ICD-10-CM | POA: Diagnosis not present

## 2020-05-06 DIAGNOSIS — I429 Cardiomyopathy, unspecified: Secondary | ICD-10-CM | POA: Diagnosis not present

## 2020-05-06 DIAGNOSIS — E039 Hypothyroidism, unspecified: Secondary | ICD-10-CM | POA: Diagnosis not present

## 2020-05-06 DIAGNOSIS — I482 Chronic atrial fibrillation, unspecified: Secondary | ICD-10-CM | POA: Diagnosis not present

## 2020-05-06 DIAGNOSIS — E2740 Unspecified adrenocortical insufficiency: Secondary | ICD-10-CM | POA: Diagnosis not present

## 2020-05-06 DIAGNOSIS — E1122 Type 2 diabetes mellitus with diabetic chronic kidney disease: Secondary | ICD-10-CM | POA: Diagnosis not present

## 2020-05-06 DIAGNOSIS — N390 Urinary tract infection, site not specified: Secondary | ICD-10-CM | POA: Diagnosis not present

## 2020-05-06 DIAGNOSIS — N183 Chronic kidney disease, stage 3 unspecified: Secondary | ICD-10-CM | POA: Diagnosis not present

## 2020-05-10 DIAGNOSIS — N390 Urinary tract infection, site not specified: Secondary | ICD-10-CM | POA: Diagnosis not present

## 2020-05-10 DIAGNOSIS — I482 Chronic atrial fibrillation, unspecified: Secondary | ICD-10-CM | POA: Diagnosis not present

## 2020-05-10 DIAGNOSIS — R9431 Abnormal electrocardiogram [ECG] [EKG]: Secondary | ICD-10-CM | POA: Diagnosis not present

## 2020-05-10 DIAGNOSIS — J309 Allergic rhinitis, unspecified: Secondary | ICD-10-CM | POA: Diagnosis not present

## 2020-05-10 DIAGNOSIS — M25512 Pain in left shoulder: Secondary | ICD-10-CM | POA: Diagnosis not present

## 2020-05-10 DIAGNOSIS — R7989 Other specified abnormal findings of blood chemistry: Secondary | ICD-10-CM | POA: Diagnosis not present

## 2020-05-10 DIAGNOSIS — G9340 Encephalopathy, unspecified: Secondary | ICD-10-CM | POA: Diagnosis not present

## 2020-05-10 DIAGNOSIS — E2740 Unspecified adrenocortical insufficiency: Secondary | ICD-10-CM | POA: Diagnosis not present

## 2020-05-10 DIAGNOSIS — E1122 Type 2 diabetes mellitus with diabetic chronic kidney disease: Secondary | ICD-10-CM | POA: Diagnosis not present

## 2020-05-10 DIAGNOSIS — R2681 Unsteadiness on feet: Secondary | ICD-10-CM | POA: Diagnosis not present

## 2020-05-10 DIAGNOSIS — I5022 Chronic systolic (congestive) heart failure: Secondary | ICD-10-CM | POA: Diagnosis not present

## 2020-05-10 DIAGNOSIS — I429 Cardiomyopathy, unspecified: Secondary | ICD-10-CM | POA: Diagnosis not present

## 2020-05-10 DIAGNOSIS — E039 Hypothyroidism, unspecified: Secondary | ICD-10-CM | POA: Diagnosis not present

## 2020-05-10 DIAGNOSIS — N189 Chronic kidney disease, unspecified: Secondary | ICD-10-CM | POA: Diagnosis not present

## 2020-05-10 DIAGNOSIS — I5020 Unspecified systolic (congestive) heart failure: Secondary | ICD-10-CM | POA: Diagnosis not present

## 2020-05-10 DIAGNOSIS — E875 Hyperkalemia: Secondary | ICD-10-CM | POA: Diagnosis not present

## 2020-05-10 DIAGNOSIS — N183 Chronic kidney disease, stage 3 unspecified: Secondary | ICD-10-CM | POA: Diagnosis not present

## 2020-05-10 DIAGNOSIS — I4892 Unspecified atrial flutter: Secondary | ICD-10-CM | POA: Diagnosis not present

## 2020-05-10 DIAGNOSIS — I130 Hypertensive heart and chronic kidney disease with heart failure and stage 1 through stage 4 chronic kidney disease, or unspecified chronic kidney disease: Secondary | ICD-10-CM | POA: Diagnosis not present

## 2020-05-11 DIAGNOSIS — G9340 Encephalopathy, unspecified: Secondary | ICD-10-CM | POA: Diagnosis not present

## 2020-05-11 DIAGNOSIS — I5020 Unspecified systolic (congestive) heart failure: Secondary | ICD-10-CM | POA: Diagnosis not present

## 2020-05-11 DIAGNOSIS — R609 Edema, unspecified: Secondary | ICD-10-CM | POA: Diagnosis not present

## 2020-05-11 DIAGNOSIS — I130 Hypertensive heart and chronic kidney disease with heart failure and stage 1 through stage 4 chronic kidney disease, or unspecified chronic kidney disease: Secondary | ICD-10-CM | POA: Diagnosis not present

## 2020-05-11 DIAGNOSIS — I429 Cardiomyopathy, unspecified: Secondary | ICD-10-CM | POA: Diagnosis not present

## 2020-05-11 DIAGNOSIS — R2681 Unsteadiness on feet: Secondary | ICD-10-CM | POA: Diagnosis not present

## 2020-05-11 DIAGNOSIS — N390 Urinary tract infection, site not specified: Secondary | ICD-10-CM | POA: Diagnosis not present

## 2020-05-11 DIAGNOSIS — E1122 Type 2 diabetes mellitus with diabetic chronic kidney disease: Secondary | ICD-10-CM | POA: Diagnosis not present

## 2020-05-11 DIAGNOSIS — N183 Chronic kidney disease, stage 3 unspecified: Secondary | ICD-10-CM | POA: Diagnosis not present

## 2020-05-11 DIAGNOSIS — I5022 Chronic systolic (congestive) heart failure: Secondary | ICD-10-CM | POA: Diagnosis not present

## 2020-05-11 DIAGNOSIS — E039 Hypothyroidism, unspecified: Secondary | ICD-10-CM | POA: Diagnosis not present

## 2020-05-11 DIAGNOSIS — M159 Polyosteoarthritis, unspecified: Secondary | ICD-10-CM | POA: Diagnosis not present

## 2020-05-11 DIAGNOSIS — I482 Chronic atrial fibrillation, unspecified: Secondary | ICD-10-CM | POA: Diagnosis not present

## 2020-05-11 DIAGNOSIS — E2740 Unspecified adrenocortical insufficiency: Secondary | ICD-10-CM | POA: Diagnosis not present

## 2020-05-17 DIAGNOSIS — I482 Chronic atrial fibrillation, unspecified: Secondary | ICD-10-CM | POA: Diagnosis not present

## 2020-05-17 DIAGNOSIS — E1122 Type 2 diabetes mellitus with diabetic chronic kidney disease: Secondary | ICD-10-CM | POA: Diagnosis not present

## 2020-05-17 DIAGNOSIS — N183 Chronic kidney disease, stage 3 unspecified: Secondary | ICD-10-CM | POA: Diagnosis not present

## 2020-05-17 DIAGNOSIS — E2740 Unspecified adrenocortical insufficiency: Secondary | ICD-10-CM | POA: Diagnosis not present

## 2020-05-17 DIAGNOSIS — I5020 Unspecified systolic (congestive) heart failure: Secondary | ICD-10-CM | POA: Diagnosis not present

## 2020-05-17 DIAGNOSIS — E039 Hypothyroidism, unspecified: Secondary | ICD-10-CM | POA: Diagnosis not present

## 2020-05-17 DIAGNOSIS — N390 Urinary tract infection, site not specified: Secondary | ICD-10-CM | POA: Diagnosis not present

## 2020-05-17 DIAGNOSIS — I130 Hypertensive heart and chronic kidney disease with heart failure and stage 1 through stage 4 chronic kidney disease, or unspecified chronic kidney disease: Secondary | ICD-10-CM | POA: Diagnosis not present

## 2020-05-17 DIAGNOSIS — I429 Cardiomyopathy, unspecified: Secondary | ICD-10-CM | POA: Diagnosis not present

## 2020-05-17 DIAGNOSIS — G9340 Encephalopathy, unspecified: Secondary | ICD-10-CM | POA: Diagnosis not present

## 2020-05-20 DIAGNOSIS — E1122 Type 2 diabetes mellitus with diabetic chronic kidney disease: Secondary | ICD-10-CM | POA: Diagnosis not present

## 2020-05-20 DIAGNOSIS — I130 Hypertensive heart and chronic kidney disease with heart failure and stage 1 through stage 4 chronic kidney disease, or unspecified chronic kidney disease: Secondary | ICD-10-CM | POA: Diagnosis not present

## 2020-05-20 DIAGNOSIS — G9340 Encephalopathy, unspecified: Secondary | ICD-10-CM | POA: Diagnosis not present

## 2020-05-20 DIAGNOSIS — E039 Hypothyroidism, unspecified: Secondary | ICD-10-CM | POA: Diagnosis not present

## 2020-05-20 DIAGNOSIS — N390 Urinary tract infection, site not specified: Secondary | ICD-10-CM | POA: Diagnosis not present

## 2020-05-20 DIAGNOSIS — E2740 Unspecified adrenocortical insufficiency: Secondary | ICD-10-CM | POA: Diagnosis not present

## 2020-05-20 DIAGNOSIS — I429 Cardiomyopathy, unspecified: Secondary | ICD-10-CM | POA: Diagnosis not present

## 2020-05-20 DIAGNOSIS — I482 Chronic atrial fibrillation, unspecified: Secondary | ICD-10-CM | POA: Diagnosis not present

## 2020-05-20 DIAGNOSIS — N183 Chronic kidney disease, stage 3 unspecified: Secondary | ICD-10-CM | POA: Diagnosis not present

## 2020-05-20 DIAGNOSIS — I5020 Unspecified systolic (congestive) heart failure: Secondary | ICD-10-CM | POA: Diagnosis not present

## 2020-06-27 ENCOUNTER — Other Ambulatory Visit: Payer: Self-pay

## 2020-06-27 NOTE — Progress Notes (Deleted)
Cardiology Office Note:    Date:  06/27/2020   ID:  Brianna Porter, DOB 11/03/1960, MRN [id number]  PCP:  Cher Nakai, MD  Cardiologist:  Shirlee More, MD    Referring MD: Cher Nakai, MD    ASSESSMENT:    No diagnosis found. PLAN:    In order of problems listed above:  1. ***   Next appointment: ***   Medication Adjustments/Labs and Tests Ordered: Current medicines are reviewed at length with the patient today.  Concerns regarding medicines are outlined above.  No orders of the defined types were placed in this encounter.  No orders of the defined types were placed in this encounter.   No chief complaint on file.   History of Present Illness:    Brianna Porter is a 59 y.o. female with a hx of atrial fibrillation with amiodarone intolerance with thyroid disease and QT prolongation, hypertensive heart disease with chronic systolic heart failure stage III CKD and mild CAD last seen 03/24/2020 with persistent atrial fibrillation. Compliance with diet, lifestyle and medications: *** Past Medical History:  Diagnosis Date  . Acute gastric ulcer with hemorrhage   . Altered mental status 05/07/2019  . Anxiety and depression   . Atrial flutter ([place])   . CAD (coronary artery disease)   . Chronic systolic CHF (congestive heart failure) (HCC)    a. dx in setting of atrial fib/flutter - possibly tachy mediated. Coronary CTA with only mild CAD in 04/2019.  Marland Kitchen CKD (chronic kidney disease) stage 3, GFR 30-59 ml/min 04/23/2019  . Confusion    a. persistent confusion during 04/2019 admission of unclear cause. Home meds adjusted. CT/MRI brain nonacute.  . Depression 03/02/2019  . Diabetes ([place])   . Edema   . Elevated liver function tests   . Essential hypertension   . Falls   . Gastrointestinal hemorrhage   . Hypercholesteremia   . Hyperkalemia   . Hyperlipidemia 04/23/2019  . Hypertensive heart and chronic kidney disease with systolic congestive heart failure ([place])   .  Hypertensive heart disease 03/02/2019  . Hyperthyroidism   . Hypokalemia   . Hypokalemia   . Hypomagnesemia   . Hyponatremia   . Iron deficiency anemia   . Mild CAD    a. Coronary CT 04/2019 - Minimal, Non-obstructive CAD.  Marland Kitchen NICM (nonischemic cardiomyopathy) ([place])   . Osteoarthritis 03/02/2019  . PAF (paroxysmal atrial fibrillation) ([place])   . Pressure injury of skin 07/09/2019  . Prolonged QT interval   . Symptomatic anemia 06/28/2019    Past Surgical History:  Procedure Laterality Date  . BIOPSY  06/29/2019   Procedure: BIOPSY;  Surgeon: Irene Shipper, MD;  Location: [place];  Service: Endoscopy;;  . CARDIOVERSION N/A 04/29/2019   Procedure: CARDIOVERSION;  Surgeon: Sanda Klein, MD;  Location: [place];  Service: Cardiovascular;  Laterality: N/A;  . CARDIOVERSION N/A 05/04/2019   Procedure: CARDIOVERSION;  Surgeon: Josue Hector, MD;  Location: [place];  Service: Cardiovascular;  Laterality: N/A;  . COLONOSCOPY WITH PROPOFOL N/A 07/12/2019   Procedure: COLONOSCOPY WITH PROPOFOL;  Surgeon: Doran Stabler, MD;  Location: [place];  Service: Gastroenterology;  Laterality: N/A;  . ENTEROSCOPY N/A 07/10/2019   Procedure: ENTEROSCOPY;  Surgeon: Doran Stabler, MD;  Location: [place];  Service: Gastroenterology;  Laterality: N/A;  . ESOPHAGOGASTRODUODENOSCOPY  06/29/2019  . ESOPHAGOGASTRODUODENOSCOPY (EGD) WITH PROPOFOL N/A 06/29/2019   Procedure: ESOPHAGOGASTRODUODENOSCOPY (EGD) WITH PROPOFOL;  Surgeon: Irene Shipper, MD;  Location: [place];  Service: Endoscopy;  Laterality: N/A;  . GIVENS CAPSULE STUDY N/A 07/12/2019   Procedure: GIVENS CAPSULE STUDY;  Surgeon: Doran Stabler, MD;  Location: [place];  Service: Gastroenterology;  Laterality: N/A;  . HEMOSTASIS CLIP PLACEMENT  07/12/2019   Procedure: HEMOSTASIS CLIP PLACEMENT;  Surgeon: Doran Stabler, MD;  Location: [place];  Service: Gastroenterology;;  . POLYPECTOMY  07/12/2019    Procedure: POLYPECTOMY;  Surgeon: Doran Stabler, MD;  Location: [place];  Service: Gastroenterology;;  . SMALL BOWEL ENTEROSCOPY  07/10/2019  . SUBMUCOSAL TATTOO INJECTION  07/10/2019   Procedure: SUBMUCOSAL TATTOO INJECTION;  Surgeon: Doran Stabler, MD;  Location: [facility] ENDOSCOPY;  Service: Gastroenterology;;  . TEE WITHOUT CARDIOVERSION  04/29/2019  . TEE WITHOUT CARDIOVERSION N/A 04/29/2019   Procedure: TRANSESOPHAGEAL ECHOCARDIOGRAM (TEE);  Surgeon: Sanda Klein, MD;  Location[facility] ENDOSCOPY;  Service: Cardiovascular;  Laterality: N/A;  . TUBAL LIGATION      Current Medications: No outpatient medications have been marked as taking for the 06/28/20 encounter (Appointment) with Richardo Priest, MD.     Allergies:   Ambien [zolpidem tartrate], Adhesive [tape], and Codeine   Social History   Socioeconomic History  . Marital status: Divorced    Spouse name: Not on file  . Number of children: Not on file  . Years of education: Not on file  . Highest education level: Not on file  Occupational History  . Not on file  Tobacco Use  . Smoking status: Former Smoker    Packs/day: 2.00    Years: 30.00    Pack years: 60.00    Types: Cigarettes    Quit date: 2001    Years since quitting: 20.8  . Smokeless tobacco: Never Used  Vaping Use  . Vaping Use: Never used  Substance and Sexual Activity  . Alcohol use: Never  . Drug use: Never  . Sexual activity: Not Currently  Other Topics Concern  . Not on file  Social History Narrative  . Not on file   Social Determinants of Health   Financial Resource Strain:   . Difficulty of Paying Living Expenses: Not on file  Food Insecurity:   . Worried About Charity fundraiser in the Last Year: Not on file  . Ran Out of Food in the Last Year: Not on file  Transportation Needs:   . Lack of Transportation (Medical): Not on file  . Lack of Transportation (Non-Medical): Not on file  Physical Activity:   . Days of Exercise per Week:  Not on file  . Minutes of Exercise per Session: Not on file  Stress:   . Feeling of Stress : Not on file  Social Connections:   . Frequency of Communication with Friends and Family: Not on file  . Frequency of Social Gatherings with Friends and Family: Not on file  . Attends Religious Services: Not on file  . Active Member of Clubs or Organizations: Not on file  . Attends Archivist Meetings: Not on file  . Marital Status: Not on file     Family History: The patient's ***family history includes Cancer in her mother; Heart disease in her father and mother; Hypercholesterolemia in her brother and mother; Osteoarthritis in her mother; Stroke in her mother. ROS:   Please see the history of present illness.    All other systems reviewed and are negative.  EKGs/Labs/Other Studies Reviewed:    The following studies were reviewed today:  EKG:  EKG ordered  today and personally reviewed.  The ekg ordered today demonstrates ***  Recent Labs: 07/09/2019: B Natriuretic Peptide 144.1 10/16/2019: NT-Pro BNP 449 03/09/2020: Magnesium 2.0 03/10/2020: ALT 81 03/18/2020: Hemoglobin 9.1; Platelets 365 04/13/2020: BUN 25; Creat 1.70; Potassium 3.7; Sodium 129; TSH 28.35  Recent Lipid Panel No results found for: CHOL, TRIG, HDL, CHOLHDL, VLDL, LDLCALC, LDLDIRECT  Physical Exam:    VS:  LMP  (LMP Unknown) Comment: ?10 years ago?    Wt Readings from Last 3 Encounters:  03/16/20 (!) 210 lb (95.3 kg)  12/25/19 151 lb 0.2 oz (68.5 kg)  10/16/19 145 lb (65.8 kg)     GEN: *** Well nourished, well developed in no acute distress HEENT: Normal NECK: No JVD; No carotid bruits LYMPHATICS: No lymphadenopathy CARDIAC: ***RRR, no murmurs, rubs, gallops RESPIRATORY:  Clear to auscultation without rales, wheezing or rhonchi  ABDOMEN: Soft, non-tender, non-distended MUSCULOSKELETAL:  No edema; No deformity  SKIN: Warm and dry NEUROLOGIC:  Alert and oriented x 3 PSYCHIATRIC:  Normal affect     Signed, Shirlee More, MD  06/27/2020 4:38 PM    [facility]

## 2020-06-28 ENCOUNTER — Ambulatory Visit: Payer: Medicare HMO | Admitting: Cardiology

## 2020-06-29 ENCOUNTER — Other Ambulatory Visit: Payer: Self-pay | Admitting: Internal Medicine

## 2020-06-29 MED ORDER — ROSUVASTATIN CALCIUM 20 MG PO TABS: 20.0000 mg | ORAL_TABLET | ORAL | 3 refills | Status: DC

## 2020-06-29 NOTE — Telephone Encounter (Signed)
Spoke with patient regarding results and recommendation.  Patient verbalizes understanding and is agreeable to plan of care. Advised patient to call back with any issues or concerns.  

## 2020-06-29 NOTE — Addendum Note (Signed)
Addended by: Resa Miner I on: 06/29/2020 11:59 AM   Modules accepted: Orders

## 2020-07-19 ENCOUNTER — Ambulatory Visit: Payer: Medicare HMO | Admitting: Internal Medicine

## 2020-07-19 DIAGNOSIS — F419 Anxiety disorder, unspecified: Secondary | ICD-10-CM | POA: Insufficient documentation

## 2020-07-19 DIAGNOSIS — I4892 Unspecified atrial flutter: Secondary | ICD-10-CM | POA: Insufficient documentation

## 2020-07-19 DIAGNOSIS — E7800 Pure hypercholesterolemia, unspecified: Secondary | ICD-10-CM | POA: Insufficient documentation

## 2020-07-19 DIAGNOSIS — I480 Paroxysmal atrial fibrillation: Secondary | ICD-10-CM | POA: Insufficient documentation

## 2020-07-19 DIAGNOSIS — I130 Hypertensive heart and chronic kidney disease with heart failure and stage 1 through stage 4 chronic kidney disease, or unspecified chronic kidney disease: Secondary | ICD-10-CM | POA: Insufficient documentation

## 2020-07-19 DIAGNOSIS — I5042 Chronic combined systolic (congestive) and diastolic (congestive) heart failure: Secondary | ICD-10-CM | POA: Insufficient documentation

## 2020-07-19 DIAGNOSIS — W19XXXA Unspecified fall, initial encounter: Secondary | ICD-10-CM | POA: Insufficient documentation

## 2020-07-19 DIAGNOSIS — E875 Hyperkalemia: Secondary | ICD-10-CM | POA: Insufficient documentation

## 2020-07-19 DIAGNOSIS — F32A Depression, unspecified: Secondary | ICD-10-CM | POA: Insufficient documentation

## 2020-07-19 DIAGNOSIS — Z0289 Encounter for other administrative examinations: Secondary | ICD-10-CM

## 2020-07-19 DIAGNOSIS — R9431 Abnormal electrocardiogram [ECG] [EKG]: Secondary | ICD-10-CM | POA: Insufficient documentation

## 2020-07-19 DIAGNOSIS — R609 Edema, unspecified: Secondary | ICD-10-CM | POA: Insufficient documentation

## 2020-07-19 DIAGNOSIS — I5022 Chronic systolic (congestive) heart failure: Secondary | ICD-10-CM | POA: Insufficient documentation

## 2020-07-19 NOTE — Progress Notes (Deleted)
Name: Brianna Porter  MRN/ DOB: [id number], 11-Jan-1961    Age/ Sex: 59 y.o., female     PCP: Cher Nakai, MD   Reason for Endocrinology Evaluation: Hypothyroidism/ AI     Initial Endocrinology Clinic Visit: 04/13/2020    PATIENT IDENTIFIER: Brianna Porter is a 59 y.o., female with a past medical history of HTN, T2DM, A. Fib and cardiomyopathy. She has followed with [facility] since 04/13/2020 for consultative assistance with management of her Hypothyroidism and adrenal insufficiency  THYROID HISTORY:  She was diagnosed with hyperthyroidism in 2019, requiring Methimazole for ~ 2 years. Has been off Methimazole   Since ~ 09/2019  Pt has been diagnosed with hypothyroidism since 02/2020   Pt presented to the ED on 03/04/2020 with c/o lower extremity edema. She was noted to have an elevated TSH of 74 uIU/mL. Amiodarone was stopped at the time. She was started on LT -4 replacement.   ADRENAL HISTORY: She was also diagnosed with adrenal insufficiency during hospitalization in 02/2020. She had an abnormal cosyntropin stimulation test with a baseline cortisol of 1.4 ug/dL and a 60 minute cortisol level of 15 ug/dL   She was started on hydrocortisone at the time     Of note the pt has been getting monthly corticosteroid injection of  left shoulder for approximately a year, not a candidate for surgery     VITAMIN D DEFICIENCY:  She has been noted to have a low Vitamin D at 12 ng/mL. She was initially started on Ergocalciferol then switched to OTC vitamin D3.   SUBJECTIVE:   Today (07/19/2020):  Brianna Porter is here for a follow up on hypothyroidism and AI. Accompanied by daughter Brianna Porter. ***     HOME ENDOCRINE MEDICATIONS: Levothyroxine 112 mcg daily  HC 5 mg, 3 tabs QAM and 1 tablet in the afternoon  Vitamin D3 1000 iu daily       HISTORY:  Past Medical History:  Past Medical History:  Diagnosis Date  . Acute gastric ulcer with hemorrhage    . Altered mental status 05/07/2019  . Anxiety and depression   . Atrial flutter ([place])   . CAD (coronary artery disease)   . Chronic systolic CHF (congestive heart failure) (HCC)    a. dx in setting of atrial fib/flutter - possibly tachy mediated. Coronary CTA with only mild CAD in 04/2019.  Marland Kitchen CKD (chronic kidney disease) stage 3, GFR 30-59 ml/min 04/23/2019  . Confusion    a. persistent confusion during 04/2019 admission of unclear cause. Home meds adjusted. CT/MRI brain nonacute.  . Depression 03/02/2019  . Diabetes ([place])   . Edema   . Elevated liver function tests   . Essential hypertension   . Falls   . Gastrointestinal hemorrhage   . Hypercholesteremia   . Hyperkalemia   . Hyperlipidemia 04/23/2019  . Hypertensive heart and chronic kidney disease with systolic congestive heart failure ([place])   . Hypertensive heart disease 03/02/2019  . Hyperthyroidism   . Hypokalemia   . Hypokalemia   . Hypomagnesemia   . Hyponatremia   . Iron deficiency anemia   . Mild CAD    a. Coronary CT 04/2019 - Minimal, Non-obstructive CAD.  Marland Kitchen NICM (nonischemic cardiomyopathy) ([place])   . Osteoarthritis 03/02/2019  . PAF (paroxysmal atrial fibrillation) ([place])   . Pressure injury of skin 07/09/2019  . Prolonged QT interval   . Symptomatic anemia 06/28/2019    Past Surgical History:  Past Surgical History:  Procedure Laterality Date  .  BIOPSY  06/29/2019   Procedure: BIOPSY;  Surgeon: Irene Shipper, MD;  Location: [place];  Service: Endoscopy;;  . CARDIOVERSION N/A 04/29/2019   Procedure: CARDIOVERSION;  Surgeon: Sanda Klein, MD;  Location: [place];  Service: Cardiovascular;  Laterality: N/A;  . CARDIOVERSION N/A 05/04/2019   Procedure: CARDIOVERSION;  Surgeon: Josue Hector, MD;  Location: [place];  Service: Cardiovascular;  Laterality: N/A;  . COLONOSCOPY WITH PROPOFOL N/A 07/12/2019   Procedure: COLONOSCOPY WITH PROPOFOL;  Surgeon: Doran Stabler, MD;  Location: [place];   Service: Gastroenterology;  Laterality: N/A;  . ENTEROSCOPY N/A 07/10/2019   Procedure: ENTEROSCOPY;  Surgeon: Doran Stabler, MD;  Location: [place];  Service: Gastroenterology;  Laterality: N/A;  . ESOPHAGOGASTRODUODENOSCOPY  06/29/2019  . ESOPHAGOGASTRODUODENOSCOPY (EGD) WITH PROPOFOL N/A 06/29/2019   Procedure: ESOPHAGOGASTRODUODENOSCOPY (EGD) WITH PROPOFOL;  Surgeon: Irene Shipper, MD;  Location: [facility] END[facility];  Service: Endoscopy;  Laterality: N/A;  . GIVENS CAPSULE STUDY N/A 07/12/2019   Procedure: GIVENS CAPSULE STUDY;  Surgeon: Doran Stabler, MD;  Location: [place];  Service: Gastroenterology;  Laterality: N/A;  . HEMOSTASIS CLIP PLACEMENT  07/12/2019   Procedure: HEMOSTASIS CLIP PLACEMENT;  Surgeon: Doran Stabler, MD;  Location: [place];  Service: Gastroenterology;;  . POLYPECTOMY  07/12/2019   Procedure: POLYPECTOMY;  Surgeon: Doran Stabler, MD;  Location: [place];  Service: Gastroenterology;;  . SMALL BOWEL ENTEROSCOPY  07/10/2019  . SUBMUCOSAL TATTOO INJECTION  07/10/2019   Procedure: SUBMUCOSAL TATTOO INJECTION;  Surgeon: Doran Stabler, MD;  Location: [facility] ENDOSCOPY;  Service: Gastroenterology;;  . TEE WITHOUT CARDIOVERSION  04/29/2019  . TEE WITHOUT CARDIOVERSION N/A 04/29/2019   Procedure: TRANSESOPHAGEAL ECHOCARDIOGRAM (TEE);  Surgeon: Sanda Klein, MD;  Location: [facility] ENDOSCOPY;  Service: Cardiovascular;  Laterality: N/A;  . TUBAL LIGATION       Social History:  reports that she quit smoking about 20 years ago. Her smoking use included cigarettes. She has a 60.00 pack-year smoking history. She has never used smokeless tobacco. She reports that she does not drink alcohol and does not use drugs. Family History:  Family History  Problem Relation Age of Onset  . Cancer Mother   . Heart disease Mother   . Hypercholesterolemia Mother   . Osteoarthritis Mother   . Stroke Mother   . Heart disease Father   . Hypercholesterolemia Brother        HOME MEDICATIONS: Allergies as of 07/19/2020      Reactions   Ambien [zolpidem Tartrate]    Pt and family state this med makes the patient sleepwalk.   Adhesive [tape]    Tears skin, please use paper tape   Codeine Nausea And Vomiting      Medication List       Accurate as of July 19, 2020  8:48 AM. If you have any questions, ask your nurse or doctor.        acetaminophen 325 MG tablet Commonly known as: TYLENOL Take 650 mg by mouth every 6 (six) hours as needed for mild pain.   albuterol 108 (90 Base) MCG/ACT inhaler Commonly known as: VENTOLIN HFA Inhale 2 puffs into the lungs every 6 (six) hours as needed for wheezing or shortness of breath.   ALPRAZolam 0.5 MG tablet Commonly known as: XANAX Take 1 tablet (0.5 mg total) by mouth 3 (three) times daily as needed for anxiety or sleep.   apixaban 5 MG Tabs tablet Commonly known as: ELIQUIS Take 1  tablet (5 mg total) by mouth 2 (two) times daily. What changed: when to take this   buPROPion 150 MG 24 hr tablet Commonly known as: WELLBUTRIN XL Take 150 mg by mouth at bedtime.   cetirizine 10 MG tablet Commonly known as: ZYRTEC Take 10 mg by mouth daily as needed for allergies.   cholecalciferol 25 MCG (1000 UNIT) tablet Commonly known as: VITAMIN D3 Take 1,000 Units by mouth daily.   ciprofloxacin 250 MG tablet Commonly known as: CIPRO Take 250 mg by mouth 2 (two) times daily.   doxycycline 100 MG tablet Commonly known as: VIBRA-TABS Take 100 mg by mouth 2 (two) times daily.   DULoxetine 60 MG capsule Commonly known as: CYMBALTA Take 1 capsule (60 mg total) by mouth 2 (two) times daily.   ergocalciferol 1.25 MG (50000 UT) capsule Commonly known as: VITAMIN D2 Take 1 capsule (50,000 Units total) by mouth once a week.   fenofibrate 48 MG tablet Commonly known as: TRICOR Take 48 mg by mouth at bedtime.   ferrous sulfate 325 (65 FE) MG tablet Take 325 mg by mouth every other day.    furosemide 40 MG tablet Commonly known as: LASIX Take 1 tablet by mouth twice daily What changed: when to take this   gabapentin 400 MG capsule Commonly known as: NEURONTIN Take 400 mg by mouth 3 (three) times daily.   hydrocortisone 5 MG tablet Commonly known as: CORTEF Take 1 tablet (5 mg total) by mouth as directed. 3 tabs with Breakfast and 1 tab between 2-4 pm   ICY HOT ADVANCED RELIEF EX Apply 1 application topically 3 (three) times daily as needed (pain).   irbesartan 150 MG tablet Commonly known as: AVAPRO Take 1 tablet (150 mg total) by mouth daily.   levothyroxine 125 MCG tablet Commonly known as: SYNTHROID Take 125 mcg by mouth daily.   levothyroxine 112 MCG tablet Commonly known as: SYNTHROID Take 1 tablet (112 mcg total) by mouth daily.   meclizine 12.5 MG tablet Commonly known as: ANTIVERT Take 1 tablet (12.5 mg total) by mouth 3 (three) times daily as needed for dizziness.   metoprolol succinate 100 MG 24 hr tablet Commonly known as: TOPROL-XL Take 1 tablet (100 mg total) by mouth daily. Take with or immediately following a meal.   OVER THE COUNTER MEDICATION Apply 1 application topically daily as needed (pain).   pantoprazole 40 MG tablet Commonly known as: PROTONIX Take 40 mg by mouth 2 (two) times daily.   polyethylene glycol 17 g packet Commonly known as: MIRALAX / GLYCOLAX Take 17 g by mouth at bedtime.   potassium chloride SA 20 MEQ tablet Commonly known as: KLOR-CON Take 1 tablet (20 mEq total) by mouth 2 (two) times daily. What changed: when to take this   pregabalin 75 MG capsule Commonly known as: LYRICA Take 75 mg by mouth 2 (two) times daily.   promethazine 25 MG tablet Commonly known as: PHENERGAN Take 25 mg by mouth every 6 (six) hours as needed for nausea or vomiting.   rosuvastatin 20 MG tablet Commonly known as: CRESTOR Take 1 tablet (20 mg total) by mouth once a week.   tetrahydrozoline-zinc 0.05-0.25 % ophthalmic  solution Commonly known as: VISINE-AC Place 2 drops into both eyes 3 (three) times daily as needed (dry eyes).   traMADol 50 MG tablet Commonly known as: ULTRAM Take 50 mg by mouth every 6 (six) hours as needed.   vitamin C 1000 MG tablet Take 1,000 mg by mouth  daily.         OBJECTIVE:   PHYSICAL EXAM: VS: LMP  (LMP Unknown) Comment: ?10 years ago?   EXAM: General: Pt appears well and is in NAD  Hydration: Well-hydrated with moist mucous membranes and good skin turgor  Eyes: External eye exam normal without stare, lid lag or exophthalmos.  EOM intact.  PERRL.  Ears, Nose, Throat: Hearing: Grossly intact bilaterally Dental: Good dentition  Throat: Clear without mass, erythema or exudate  Neck: General: Supple without adenopathy. Thyroid: Thyroid size normal.  No goiter or nodules appreciated. No thyroid bruit.  Lungs: Clear with good BS bilat with no rales, rhonchi, or wheezes  Heart: Auscultation: RRR.  Abdomen: Normoactive bowel sounds, soft, nontender, without masses or organomegaly palpable  Extremities: Gait and station: Normal gait  Digits and nails: No clubbing, cyanosis, petechiae, or nodes Head and neck: Normal alignment and mobility BL UE: Normal ROM and strength. BL LE: No pretibial edema normal ROM and strength.  Skin: Hair: Texture and amount normal with gender appropriate distribution Skin Inspection: No rashes, acanthosis nigricans/skin tags. No lipohypertrophy Skin Palpation: Skin temperature, texture, and thickness normal to palpation  Neuro: Cranial nerves: II - XII grossly intact  Cerebellar: Normal coordination and movement; no tremor Motor: Normal strength throughout DTRs: 2+ and symmetric in UE without delay in relaxation phase  Mental Status: Judgment, insight: Intact Orientation: Oriented to time, place, and person Memory: Intact for recent and remote events Mood and affect: No depression, anxiety, or agitation     DATA  REVIEWED: ***    ASSESSMENT / PLAN / RECOMMENDATIONS:   1. Hypothyroidism  - Pt was initially diagnosed with Hyperthyroidism sometimes in 2018/2019 requiring thionamide therapy. IN 09/2019 she was noted with elevated TSH while on Methimazole which was held but the pt continued with hypothyroidism despite being off thionamide therapy for ~ 6 months     Medications :    Levothyroxine 112 mcg daily      2. Adrenal Insufficiency:  - Most likely secondary to chronic steroid intake through intra-articular injections.  - Will adjust HC dose as below based on basal surface area - Unfortunately she has chronic left shoulder pain, not a candidate for surgery and this is deemed to be a chronic . No point in trying to wean off physiologic dose of steroids since she will continue to have these monthly injections.  - Pt advised to obtain a medical alert bracelet - Discussed sick day rule    Medications: HC 5 mg, 3 tabs with Breakfast and 1 tab in the afternoon      3. Vitamin D deficiency:    Pt will switch to OTC vitamin D 1000 iu daily after the prescription is complete.     F/U in    Signed electronically by: Mack Guise, MD  [facility]  [facility] [address] [place],  [address] Phone: [phone] FAX: [phone]      CC: Cher Nakai, MD [place] [address] Phone: [phone]  Fax: [phone]   Return to Endocrinology clinic as below: Future Appointments  Date Time Provider [place]  07/19/2020  3:00 PM Decie Verne, Melanie Crazier, MD [facility]  07/26/2020  3:40 PM Richardo Priest, MD [facility] None

## 2020-07-22 ENCOUNTER — Other Ambulatory Visit: Payer: Self-pay | Admitting: Internal Medicine

## 2020-07-25 NOTE — Progress Notes (Deleted)
Cardiology Office Note:    Date:  07/25/2020   ID:  Brianna Porter, DOB 08-Sep-1960, MRN [id number]  PCP:  Cher Nakai, MD  Cardiologist:  Shirlee More, MD    Referring MD: Cher Nakai, MD    ASSESSMENT:    No diagnosis found. PLAN:    In order of problems listed above:  1. ***   Next appointment: ***   Medication Adjustments/Labs and Tests Ordered: Current medicines are reviewed at length with the patient today.  Concerns regarding medicines are outlined above.  No orders of the defined types were placed in this encounter.  No orders of the defined types were placed in this encounter.   No chief complaint on file.   History of Present Illness:    Brianna Porter is a 59 y.o. female with a hx of persistent atrial fibrillation amiodarone intolerance of thyrotoxicosis and severe symptomatic hypothyroidism and QT prolongation last seen 08/05/20201..  She also has a history of cardiomyopathy heart failure mild nonobstructive CAD hypertension and stage III CKD.  Ejection fraction improved on her most recent echocardiogram 40 to 45%. Compliance with diet, lifestyle and medications: *** Past Medical History:  Diagnosis Date  . Acute gastric ulcer with hemorrhage   . Altered mental status 05/07/2019  . Anxiety and depression   . Atrial flutter ([place])   . CAD (coronary artery disease)   . Chronic systolic CHF (congestive heart failure) (HCC)    a. dx in setting of atrial fib/flutter - possibly tachy mediated. Coronary CTA with only mild CAD in 04/2019.  Marland Kitchen CKD (chronic kidney disease) stage 3, GFR 30-59 ml/min 04/23/2019  . Confusion    a. persistent confusion during 04/2019 admission of unclear cause. Home meds adjusted. CT/MRI brain nonacute.  . Depression 03/02/2019  . Diabetes ([place])   . Edema   . Elevated liver function tests   . Essential hypertension   . Falls   . Gastrointestinal hemorrhage   . Hypercholesteremia   . Hyperkalemia   . Hyperlipidemia 04/23/2019  .  Hypertensive heart and chronic kidney disease with systolic congestive heart failure ([place])   . Hypertensive heart disease 03/02/2019  . Hyperthyroidism   . Hypokalemia   . Hypokalemia   . Hypomagnesemia   . Hyponatremia   . Iron deficiency anemia   . Mild CAD    a. Coronary CT 04/2019 - Minimal, Non-obstructive CAD.  Marland Kitchen NICM (nonischemic cardiomyopathy) ([place])   . Osteoarthritis 03/02/2019  . PAF (paroxysmal atrial fibrillation) ([place])   . Pressure injury of skin 07/09/2019  . Prolonged QT interval   . Symptomatic anemia 06/28/2019    Past Surgical History:  Procedure Laterality Date  . BIOPSY  06/29/2019   Procedure: BIOPSY;  Surgeon: Irene Shipper, MD;  Location: [place];  Service: Endoscopy;;  . CARDIOVERSION N/A 04/29/2019   Procedure: CARDIOVERSION;  Surgeon: Sanda Klein, MD;  Location: [place];  Service: Cardiovascular;  Laterality: N/A;  . CARDIOVERSION N/A 05/04/2019   Procedure: CARDIOVERSION;  Surgeon: Josue Hector, MD;  Location: [place];  Service: Cardiovascular;  Laterality: N/A;  . COLONOSCOPY WITH PROPOFOL N/A 07/12/2019   Procedure: COLONOSCOPY WITH PROPOFOL;  Surgeon: Doran Stabler, MD;  Location: [place];  Service: Gastroenterology;  Laterality: N/A;  . ENTEROSCOPY N/A 07/10/2019   Procedure: ENTEROSCOPY;  Surgeon: Doran Stabler, MD;  Location: [place];  Service: Gastroenterology;  Laterality: N/A;  . ESOPHAGOGASTRODUODENOSCOPY  06/29/2019  . ESOPHAGOGASTRODUODENOSCOPY (EGD) WITH PROPOFOL N/A 06/29/2019   Procedure:  ESOPHAGOGASTRODUODENOSCOPY (EGD) WITH PROPOFOL;  Surgeon: Irene Shipper, MD;  Location: [place];  Service: Endoscopy;  Laterality: N/A;  . GIVENS CAPSULE STUDY N/A 07/12/2019   Procedure: GIVENS CAPSULE STUDY;  Surgeon: Doran Stabler, MD;  Location: [place];  Service: Gastroenterology;  Laterality: N/A;  . HEMOSTASIS CLIP PLACEMENT  07/12/2019   Procedure: HEMOSTASIS CLIP PLACEMENT;  Surgeon: Doran Stabler, MD;  Location: [place];  Service: Gastroenterology;;  . POLYPECTOMY  07/12/2019   Procedure: POLYPECTOMY;  Surgeon: Doran Stabler, MD;  Location: [place];  Service: Gastroenterology;;  . SMALL BOWEL ENTEROSCOPY  07/10/2019  . SUBMUCOSAL TATTOO INJECTION  07/10/2019   Procedure: SUBMUCOSAL TATTOO INJECTION;  Surgeon: Doran Stabler, MD;  Location: [facility] ENDOSCOPY;  Service: Gastroenterology;;  . TEE WITHOUT CARDIOVERSION  04/29/2019  . TEE WITHOUT CARDIOVERSION N/A 04/29/2019   Procedure: TRANSESOPHAGEAL ECHOCARDIOGRAM (TEE);  Surgeon: Sanda Klein, MD;  Location: [facility] ENDOSCOPY;  Service: Cardiovascular;  Laterality: N/A;  . TUBAL LIGATION      Current Medications: No outpatient medications have been marked as taking for the 07/26/20 encounter (Appointment) with Richardo Priest, MD.     Allergies:   Ambien [zolpidem tartrate], Adhesive [tape], and Codeine   Social History   Socioeconomic History  . Marital status: Divorced    Spouse name: Not on file  . Number of children: Not on file  . Years of education: Not on file  . Highest education level: Not on file  Occupational History  . Not on file  Tobacco Use  . Smoking status: Former Smoker    Packs/day: 2.00    Years: 30.00    Pack years: 60.00    Types: Cigarettes    Quit date: 2001    Years since quitting: 20.9  . Smokeless tobacco: Never Used  Vaping Use  . Vaping Use: Never used  Substance and Sexual Activity  . Alcohol use: Never  . Drug use: Never  . Sexual activity: Not Currently  Other Topics Concern  . Not on file  Social History Narrative  . Not on file   Social Determinants of Health   Financial Resource Strain:   . Difficulty of Paying Living Expenses: Not on file  Food Insecurity:   . Worried About Charity fundraiser in the Last Year: Not on file  . Ran Out of Food in the Last Year: Not on file  Transportation Needs:   . Lack of Transportation (Medical): Not on file  . Lack of  Transportation (Non-Medical): Not on file  Physical Activity:   . Days of Exercise per Week: Not on file  . Minutes of Exercise per Session: Not on file  Stress:   . Feeling of Stress : Not on file  Social Connections:   . Frequency of Communication with Friends and Family: Not on file  . Frequency of Social Gatherings with Friends and Family: Not on file  . Attends Religious Services: Not on file  . Active Member of Clubs or Organizations: Not on file  . Attends Archivist Meetings: Not on file  . Marital Status: Not on file     Family History: The patient's ***family history includes Cancer in her mother; Heart disease in her father and mother; Hypercholesterolemia in her brother and mother; Osteoarthritis in her mother; Stroke in her mother. ROS:   Please see the history of present illness.    All other systems reviewed and are negative.  EKGs/Labs/Other Studies  Reviewed:    The following studies were reviewed today:  EKG:  EKG ordered today and personally reviewed.  The ekg ordered today demonstrates ***  Recent Labs: 10/16/2019: NT-Pro BNP 449 03/09/2020: Magnesium 2.0 03/10/2020: ALT 81 03/18/2020: Hemoglobin 9.1; Platelets 365 04/13/2020: BUN 25; Creat 1.70; Potassium 3.7; Sodium 129; TSH 28.35  Recent Lipid Panel No results found for: CHOL, TRIG, HDL, CHOLHDL, VLDL, LDLCALC, LDLDIRECT  Physical Exam:    VS:  LMP  (LMP Unknown) Comment: ?10 years ago?    Wt Readings from Last 3 Encounters:  03/16/20 (!) 210 lb (95.3 kg)  12/25/19 151 lb 0.2 oz (68.5 kg)  10/16/19 145 lb (65.8 kg)     GEN: *** Well nourished, well developed in no acute distress HEENT: Normal NECK: No JVD; No carotid bruits LYMPHATICS: No lymphadenopathy CARDIAC: ***RRR, no murmurs, rubs, gallops RESPIRATORY:  Clear to auscultation without rales, wheezing or rhonchi  ABDOMEN: Soft, non-tender, non-distended MUSCULOSKELETAL:  No edema; No deformity  SKIN: Warm and dry NEUROLOGIC:  Alert  and oriented x 3 PSYCHIATRIC:  Normal affect    Signed, Shirlee More, MD  07/25/2020 4:47 PM    [facility]

## 2020-07-26 ENCOUNTER — Ambulatory Visit: Payer: Medicare HMO | Admitting: Cardiology

## 2020-08-23 DIAGNOSIS — S2241XA Multiple fractures of ribs, right side, initial encounter for closed fracture: Secondary | ICD-10-CM | POA: Diagnosis not present

## 2020-08-23 DIAGNOSIS — R4182 Altered mental status, unspecified: Secondary | ICD-10-CM | POA: Diagnosis not present

## 2020-08-23 DIAGNOSIS — R5381 Other malaise: Secondary | ICD-10-CM | POA: Diagnosis not present

## 2020-08-23 DIAGNOSIS — J189 Pneumonia, unspecified organism: Secondary | ICD-10-CM | POA: Diagnosis not present

## 2020-08-23 DIAGNOSIS — Z79899 Other long term (current) drug therapy: Secondary | ICD-10-CM | POA: Diagnosis not present

## 2020-08-23 DIAGNOSIS — J181 Lobar pneumonia, unspecified organism: Secondary | ICD-10-CM | POA: Diagnosis not present

## 2020-08-23 DIAGNOSIS — I340 Nonrheumatic mitral (valve) insufficiency: Secondary | ICD-10-CM | POA: Diagnosis not present

## 2020-08-23 DIAGNOSIS — I4891 Unspecified atrial fibrillation: Secondary | ICD-10-CM | POA: Diagnosis not present

## 2020-08-23 DIAGNOSIS — L02212 Cutaneous abscess of back [any part, except buttock]: Secondary | ICD-10-CM | POA: Diagnosis not present

## 2020-08-23 DIAGNOSIS — I482 Chronic atrial fibrillation, unspecified: Secondary | ICD-10-CM | POA: Diagnosis not present

## 2020-08-23 DIAGNOSIS — G9341 Metabolic encephalopathy: Secondary | ICD-10-CM | POA: Diagnosis not present

## 2020-08-23 DIAGNOSIS — I110 Hypertensive heart disease with heart failure: Secondary | ICD-10-CM | POA: Diagnosis not present

## 2020-08-23 DIAGNOSIS — E7800 Pure hypercholesterolemia, unspecified: Secondary | ICD-10-CM | POA: Diagnosis not present

## 2020-08-23 DIAGNOSIS — G629 Polyneuropathy, unspecified: Secondary | ICD-10-CM | POA: Diagnosis not present

## 2020-08-23 DIAGNOSIS — Z7902 Long term (current) use of antithrombotics/antiplatelets: Secondary | ICD-10-CM | POA: Diagnosis not present

## 2020-08-23 DIAGNOSIS — G40909 Epilepsy, unspecified, not intractable, without status epilepticus: Secondary | ICD-10-CM | POA: Diagnosis not present

## 2020-08-23 DIAGNOSIS — I361 Nonrheumatic tricuspid (valve) insufficiency: Secondary | ICD-10-CM | POA: Diagnosis not present

## 2020-08-23 DIAGNOSIS — L03116 Cellulitis of left lower limb: Secondary | ICD-10-CM | POA: Diagnosis not present

## 2020-08-23 DIAGNOSIS — R296 Repeated falls: Secondary | ICD-10-CM | POA: Diagnosis not present

## 2020-08-23 DIAGNOSIS — I2510 Atherosclerotic heart disease of native coronary artery without angina pectoris: Secondary | ICD-10-CM | POA: Diagnosis not present

## 2020-08-23 DIAGNOSIS — L03312 Cellulitis of back [any part except buttock]: Secondary | ICD-10-CM | POA: Diagnosis not present

## 2020-08-23 DIAGNOSIS — R739 Hyperglycemia, unspecified: Secondary | ICD-10-CM | POA: Diagnosis not present

## 2020-08-23 DIAGNOSIS — E785 Hyperlipidemia, unspecified: Secondary | ICD-10-CM | POA: Diagnosis not present

## 2020-08-23 DIAGNOSIS — I509 Heart failure, unspecified: Secondary | ICD-10-CM | POA: Diagnosis not present

## 2020-08-23 DIAGNOSIS — R6510 Systemic inflammatory response syndrome (SIRS) of non-infectious origin without acute organ dysfunction: Secondary | ICD-10-CM | POA: Diagnosis not present

## 2020-08-23 DIAGNOSIS — L989 Disorder of the skin and subcutaneous tissue, unspecified: Secondary | ICD-10-CM | POA: Diagnosis not present

## 2020-08-23 DIAGNOSIS — F419 Anxiety disorder, unspecified: Secondary | ICD-10-CM | POA: Diagnosis not present

## 2020-08-23 DIAGNOSIS — I700 Atherosclerosis of aorta: Secondary | ICD-10-CM | POA: Diagnosis not present

## 2020-08-23 DIAGNOSIS — Z87891 Personal history of nicotine dependence: Secondary | ICD-10-CM | POA: Diagnosis not present

## 2020-08-23 DIAGNOSIS — K760 Fatty (change of) liver, not elsewhere classified: Secondary | ICD-10-CM | POA: Diagnosis not present

## 2020-08-24 DIAGNOSIS — J189 Pneumonia, unspecified organism: Secondary | ICD-10-CM | POA: Diagnosis not present

## 2020-08-24 DIAGNOSIS — G9341 Metabolic encephalopathy: Secondary | ICD-10-CM | POA: Diagnosis not present

## 2020-08-24 DIAGNOSIS — R4182 Altered mental status, unspecified: Secondary | ICD-10-CM | POA: Diagnosis not present

## 2020-08-24 DIAGNOSIS — L02212 Cutaneous abscess of back [any part, except buttock]: Secondary | ICD-10-CM | POA: Diagnosis not present

## 2020-08-25 DIAGNOSIS — J189 Pneumonia, unspecified organism: Secondary | ICD-10-CM | POA: Diagnosis not present

## 2020-08-25 DIAGNOSIS — L02212 Cutaneous abscess of back [any part, except buttock]: Secondary | ICD-10-CM | POA: Diagnosis not present

## 2020-08-25 DIAGNOSIS — G9341 Metabolic encephalopathy: Secondary | ICD-10-CM | POA: Diagnosis not present

## 2020-08-26 DIAGNOSIS — G9341 Metabolic encephalopathy: Secondary | ICD-10-CM | POA: Diagnosis not present

## 2020-08-26 DIAGNOSIS — L02212 Cutaneous abscess of back [any part, except buttock]: Secondary | ICD-10-CM | POA: Diagnosis not present

## 2020-08-26 DIAGNOSIS — J189 Pneumonia, unspecified organism: Secondary | ICD-10-CM | POA: Diagnosis not present

## 2020-08-27 DIAGNOSIS — J189 Pneumonia, unspecified organism: Secondary | ICD-10-CM | POA: Diagnosis not present

## 2020-08-27 DIAGNOSIS — L02212 Cutaneous abscess of back [any part, except buttock]: Secondary | ICD-10-CM | POA: Diagnosis not present

## 2020-08-27 DIAGNOSIS — G9341 Metabolic encephalopathy: Secondary | ICD-10-CM | POA: Diagnosis not present

## 2020-08-28 ENCOUNTER — Other Ambulatory Visit: Payer: Self-pay | Admitting: Internal Medicine

## 2020-08-28 DIAGNOSIS — L02212 Cutaneous abscess of back [any part, except buttock]: Secondary | ICD-10-CM | POA: Diagnosis not present

## 2020-08-28 DIAGNOSIS — J189 Pneumonia, unspecified organism: Secondary | ICD-10-CM | POA: Diagnosis not present

## 2020-08-28 DIAGNOSIS — G9341 Metabolic encephalopathy: Secondary | ICD-10-CM | POA: Diagnosis not present

## 2020-09-02 DIAGNOSIS — E669 Obesity, unspecified: Secondary | ICD-10-CM | POA: Diagnosis not present

## 2020-09-02 DIAGNOSIS — R9431 Abnormal electrocardiogram [ECG] [EKG]: Secondary | ICD-10-CM | POA: Diagnosis not present

## 2020-09-02 DIAGNOSIS — E875 Hyperkalemia: Secondary | ICD-10-CM | POA: Diagnosis not present

## 2020-09-02 DIAGNOSIS — M159 Polyosteoarthritis, unspecified: Secondary | ICD-10-CM | POA: Diagnosis not present

## 2020-09-02 DIAGNOSIS — E1169 Type 2 diabetes mellitus with other specified complication: Secondary | ICD-10-CM | POA: Diagnosis not present

## 2020-09-02 DIAGNOSIS — I482 Chronic atrial fibrillation, unspecified: Secondary | ICD-10-CM | POA: Diagnosis not present

## 2020-09-02 DIAGNOSIS — I5022 Chronic systolic (congestive) heart failure: Secondary | ICD-10-CM | POA: Diagnosis not present

## 2020-09-02 DIAGNOSIS — R2681 Unsteadiness on feet: Secondary | ICD-10-CM | POA: Diagnosis not present

## 2020-09-02 DIAGNOSIS — N189 Chronic kidney disease, unspecified: Secondary | ICD-10-CM | POA: Diagnosis not present

## 2020-09-02 DIAGNOSIS — I4892 Unspecified atrial flutter: Secondary | ICD-10-CM | POA: Diagnosis not present

## 2020-09-02 DIAGNOSIS — R7989 Other specified abnormal findings of blood chemistry: Secondary | ICD-10-CM | POA: Diagnosis not present

## 2020-09-02 DIAGNOSIS — J309 Allergic rhinitis, unspecified: Secondary | ICD-10-CM | POA: Diagnosis not present

## 2020-09-06 DIAGNOSIS — J189 Pneumonia, unspecified organism: Secondary | ICD-10-CM | POA: Diagnosis not present

## 2020-09-06 DIAGNOSIS — I482 Chronic atrial fibrillation, unspecified: Secondary | ICD-10-CM | POA: Diagnosis not present

## 2020-09-06 DIAGNOSIS — R2681 Unsteadiness on feet: Secondary | ICD-10-CM | POA: Diagnosis not present

## 2020-09-06 DIAGNOSIS — L0390 Cellulitis, unspecified: Secondary | ICD-10-CM | POA: Diagnosis not present

## 2020-09-06 DIAGNOSIS — E1169 Type 2 diabetes mellitus with other specified complication: Secondary | ICD-10-CM | POA: Diagnosis not present

## 2020-09-06 DIAGNOSIS — I4892 Unspecified atrial flutter: Secondary | ICD-10-CM | POA: Diagnosis not present

## 2020-09-06 DIAGNOSIS — J309 Allergic rhinitis, unspecified: Secondary | ICD-10-CM | POA: Diagnosis not present

## 2020-09-06 DIAGNOSIS — N189 Chronic kidney disease, unspecified: Secondary | ICD-10-CM | POA: Diagnosis not present

## 2020-09-06 DIAGNOSIS — I5022 Chronic systolic (congestive) heart failure: Secondary | ICD-10-CM | POA: Diagnosis not present

## 2020-09-06 DIAGNOSIS — E669 Obesity, unspecified: Secondary | ICD-10-CM | POA: Diagnosis not present

## 2020-09-06 DIAGNOSIS — R7989 Other specified abnormal findings of blood chemistry: Secondary | ICD-10-CM | POA: Diagnosis not present

## 2020-09-06 DIAGNOSIS — E875 Hyperkalemia: Secondary | ICD-10-CM | POA: Diagnosis not present

## 2020-09-26 DIAGNOSIS — R7989 Other specified abnormal findings of blood chemistry: Secondary | ICD-10-CM | POA: Diagnosis not present

## 2020-09-26 DIAGNOSIS — I5022 Chronic systolic (congestive) heart failure: Secondary | ICD-10-CM | POA: Diagnosis not present

## 2020-09-26 DIAGNOSIS — J0100 Acute maxillary sinusitis, unspecified: Secondary | ICD-10-CM | POA: Diagnosis not present

## 2020-09-26 DIAGNOSIS — E669 Obesity, unspecified: Secondary | ICD-10-CM | POA: Diagnosis not present

## 2020-09-26 DIAGNOSIS — I4892 Unspecified atrial flutter: Secondary | ICD-10-CM | POA: Diagnosis not present

## 2020-09-26 DIAGNOSIS — E1169 Type 2 diabetes mellitus with other specified complication: Secondary | ICD-10-CM | POA: Diagnosis not present

## 2020-09-26 DIAGNOSIS — I482 Chronic atrial fibrillation, unspecified: Secondary | ICD-10-CM | POA: Diagnosis not present

## 2020-09-26 DIAGNOSIS — J309 Allergic rhinitis, unspecified: Secondary | ICD-10-CM | POA: Diagnosis not present

## 2020-09-26 DIAGNOSIS — J449 Chronic obstructive pulmonary disease, unspecified: Secondary | ICD-10-CM | POA: Diagnosis not present

## 2020-09-26 DIAGNOSIS — E875 Hyperkalemia: Secondary | ICD-10-CM | POA: Diagnosis not present

## 2020-09-26 DIAGNOSIS — N189 Chronic kidney disease, unspecified: Secondary | ICD-10-CM | POA: Diagnosis not present

## 2020-09-26 DIAGNOSIS — R2681 Unsteadiness on feet: Secondary | ICD-10-CM | POA: Diagnosis not present

## 2020-09-30 DIAGNOSIS — I4892 Unspecified atrial flutter: Secondary | ICD-10-CM | POA: Diagnosis not present

## 2020-09-30 DIAGNOSIS — R7989 Other specified abnormal findings of blood chemistry: Secondary | ICD-10-CM | POA: Diagnosis not present

## 2020-09-30 DIAGNOSIS — J309 Allergic rhinitis, unspecified: Secondary | ICD-10-CM | POA: Diagnosis not present

## 2020-09-30 DIAGNOSIS — E1169 Type 2 diabetes mellitus with other specified complication: Secondary | ICD-10-CM | POA: Diagnosis not present

## 2020-09-30 DIAGNOSIS — B370 Candidal stomatitis: Secondary | ICD-10-CM | POA: Diagnosis not present

## 2020-09-30 DIAGNOSIS — N189 Chronic kidney disease, unspecified: Secondary | ICD-10-CM | POA: Diagnosis not present

## 2020-09-30 DIAGNOSIS — M159 Polyosteoarthritis, unspecified: Secondary | ICD-10-CM | POA: Diagnosis not present

## 2020-09-30 DIAGNOSIS — R2681 Unsteadiness on feet: Secondary | ICD-10-CM | POA: Diagnosis not present

## 2020-09-30 DIAGNOSIS — I482 Chronic atrial fibrillation, unspecified: Secondary | ICD-10-CM | POA: Diagnosis not present

## 2020-09-30 DIAGNOSIS — F419 Anxiety disorder, unspecified: Secondary | ICD-10-CM | POA: Diagnosis not present

## 2020-09-30 DIAGNOSIS — E669 Obesity, unspecified: Secondary | ICD-10-CM | POA: Diagnosis not present

## 2020-09-30 DIAGNOSIS — I5022 Chronic systolic (congestive) heart failure: Secondary | ICD-10-CM | POA: Diagnosis not present

## 2020-10-21 DIAGNOSIS — I5022 Chronic systolic (congestive) heart failure: Secondary | ICD-10-CM | POA: Diagnosis not present

## 2020-10-21 DIAGNOSIS — J309 Allergic rhinitis, unspecified: Secondary | ICD-10-CM | POA: Diagnosis not present

## 2020-10-21 DIAGNOSIS — R7989 Other specified abnormal findings of blood chemistry: Secondary | ICD-10-CM | POA: Diagnosis not present

## 2020-10-21 DIAGNOSIS — R9431 Abnormal electrocardiogram [ECG] [EKG]: Secondary | ICD-10-CM | POA: Diagnosis not present

## 2020-10-21 DIAGNOSIS — E1169 Type 2 diabetes mellitus with other specified complication: Secondary | ICD-10-CM | POA: Diagnosis not present

## 2020-10-21 DIAGNOSIS — N189 Chronic kidney disease, unspecified: Secondary | ICD-10-CM | POA: Diagnosis not present

## 2020-10-21 DIAGNOSIS — R2681 Unsteadiness on feet: Secondary | ICD-10-CM | POA: Diagnosis not present

## 2020-10-21 DIAGNOSIS — I4892 Unspecified atrial flutter: Secondary | ICD-10-CM | POA: Diagnosis not present

## 2020-10-21 DIAGNOSIS — E875 Hyperkalemia: Secondary | ICD-10-CM | POA: Diagnosis not present

## 2020-10-21 DIAGNOSIS — N390 Urinary tract infection, site not specified: Secondary | ICD-10-CM | POA: Diagnosis not present

## 2020-10-21 DIAGNOSIS — E669 Obesity, unspecified: Secondary | ICD-10-CM | POA: Diagnosis not present

## 2020-10-21 DIAGNOSIS — I482 Chronic atrial fibrillation, unspecified: Secondary | ICD-10-CM | POA: Diagnosis not present

## 2020-10-25 DIAGNOSIS — E1169 Type 2 diabetes mellitus with other specified complication: Secondary | ICD-10-CM | POA: Diagnosis not present

## 2020-10-25 DIAGNOSIS — R7989 Other specified abnormal findings of blood chemistry: Secondary | ICD-10-CM | POA: Diagnosis not present

## 2020-10-25 DIAGNOSIS — N189 Chronic kidney disease, unspecified: Secondary | ICD-10-CM | POA: Diagnosis not present

## 2020-10-25 DIAGNOSIS — I4892 Unspecified atrial flutter: Secondary | ICD-10-CM | POA: Diagnosis not present

## 2020-10-25 DIAGNOSIS — R2681 Unsteadiness on feet: Secondary | ICD-10-CM | POA: Diagnosis not present

## 2020-10-25 DIAGNOSIS — E669 Obesity, unspecified: Secondary | ICD-10-CM | POA: Diagnosis not present

## 2020-10-25 DIAGNOSIS — F419 Anxiety disorder, unspecified: Secondary | ICD-10-CM | POA: Diagnosis not present

## 2020-10-25 DIAGNOSIS — M159 Polyosteoarthritis, unspecified: Secondary | ICD-10-CM | POA: Diagnosis not present

## 2020-10-25 DIAGNOSIS — I5022 Chronic systolic (congestive) heart failure: Secondary | ICD-10-CM | POA: Diagnosis not present

## 2020-10-25 DIAGNOSIS — J309 Allergic rhinitis, unspecified: Secondary | ICD-10-CM | POA: Diagnosis not present

## 2020-10-25 DIAGNOSIS — E875 Hyperkalemia: Secondary | ICD-10-CM | POA: Diagnosis not present

## 2020-10-25 DIAGNOSIS — I482 Chronic atrial fibrillation, unspecified: Secondary | ICD-10-CM | POA: Diagnosis not present

## 2020-11-08 DIAGNOSIS — D509 Iron deficiency anemia, unspecified: Secondary | ICD-10-CM | POA: Diagnosis not present

## 2020-11-08 DIAGNOSIS — I482 Chronic atrial fibrillation, unspecified: Secondary | ICD-10-CM | POA: Diagnosis not present

## 2020-11-08 DIAGNOSIS — E1169 Type 2 diabetes mellitus with other specified complication: Secondary | ICD-10-CM | POA: Diagnosis not present

## 2020-11-08 DIAGNOSIS — N189 Chronic kidney disease, unspecified: Secondary | ICD-10-CM | POA: Diagnosis not present

## 2020-11-08 DIAGNOSIS — J309 Allergic rhinitis, unspecified: Secondary | ICD-10-CM | POA: Diagnosis not present

## 2020-11-08 DIAGNOSIS — F419 Anxiety disorder, unspecified: Secondary | ICD-10-CM | POA: Diagnosis not present

## 2020-11-08 DIAGNOSIS — M159 Polyosteoarthritis, unspecified: Secondary | ICD-10-CM | POA: Diagnosis not present

## 2020-11-08 DIAGNOSIS — R110 Nausea: Secondary | ICD-10-CM | POA: Diagnosis not present

## 2020-11-08 DIAGNOSIS — R7989 Other specified abnormal findings of blood chemistry: Secondary | ICD-10-CM | POA: Diagnosis not present

## 2020-11-08 DIAGNOSIS — E669 Obesity, unspecified: Secondary | ICD-10-CM | POA: Diagnosis not present

## 2020-11-08 DIAGNOSIS — E7800 Pure hypercholesterolemia, unspecified: Secondary | ICD-10-CM | POA: Diagnosis not present

## 2020-11-08 DIAGNOSIS — E039 Hypothyroidism, unspecified: Secondary | ICD-10-CM | POA: Diagnosis not present

## 2020-11-08 DIAGNOSIS — R2681 Unsteadiness on feet: Secondary | ICD-10-CM | POA: Diagnosis not present

## 2020-11-08 DIAGNOSIS — I10 Essential (primary) hypertension: Secondary | ICD-10-CM | POA: Diagnosis not present

## 2020-11-08 DIAGNOSIS — I5022 Chronic systolic (congestive) heart failure: Secondary | ICD-10-CM | POA: Diagnosis not present

## 2020-11-08 DIAGNOSIS — I4892 Unspecified atrial flutter: Secondary | ICD-10-CM | POA: Diagnosis not present

## 2020-11-11 DIAGNOSIS — M19012 Primary osteoarthritis, left shoulder: Secondary | ICD-10-CM | POA: Diagnosis not present

## 2020-11-11 DIAGNOSIS — M25512 Pain in left shoulder: Secondary | ICD-10-CM | POA: Diagnosis not present

## 2020-11-11 DIAGNOSIS — G8929 Other chronic pain: Secondary | ICD-10-CM | POA: Diagnosis not present

## 2020-11-13 DIAGNOSIS — Z8679 Personal history of other diseases of the circulatory system: Secondary | ICD-10-CM | POA: Diagnosis not present

## 2020-11-13 DIAGNOSIS — I4891 Unspecified atrial fibrillation: Secondary | ICD-10-CM | POA: Diagnosis not present

## 2020-11-13 DIAGNOSIS — Z79899 Other long term (current) drug therapy: Secondary | ICD-10-CM | POA: Diagnosis not present

## 2020-11-13 DIAGNOSIS — N182 Chronic kidney disease, stage 2 (mild): Secondary | ICD-10-CM | POA: Diagnosis not present

## 2020-11-13 DIAGNOSIS — J189 Pneumonia, unspecified organism: Secondary | ICD-10-CM | POA: Diagnosis not present

## 2020-11-13 DIAGNOSIS — G4089 Other seizures: Secondary | ICD-10-CM | POA: Diagnosis not present

## 2020-11-13 DIAGNOSIS — Z87891 Personal history of nicotine dependence: Secondary | ICD-10-CM | POA: Diagnosis not present

## 2020-11-13 DIAGNOSIS — E1122 Type 2 diabetes mellitus with diabetic chronic kidney disease: Secondary | ICD-10-CM | POA: Diagnosis not present

## 2020-11-13 DIAGNOSIS — R569 Unspecified convulsions: Secondary | ICD-10-CM | POA: Diagnosis not present

## 2020-11-13 DIAGNOSIS — I509 Heart failure, unspecified: Secondary | ICD-10-CM | POA: Diagnosis not present

## 2020-11-13 DIAGNOSIS — E7800 Pure hypercholesterolemia, unspecified: Secondary | ICD-10-CM | POA: Diagnosis not present

## 2020-11-18 DIAGNOSIS — M159 Polyosteoarthritis, unspecified: Secondary | ICD-10-CM | POA: Diagnosis not present

## 2020-11-18 DIAGNOSIS — I4892 Unspecified atrial flutter: Secondary | ICD-10-CM | POA: Diagnosis not present

## 2020-11-18 DIAGNOSIS — E669 Obesity, unspecified: Secondary | ICD-10-CM | POA: Diagnosis not present

## 2020-11-18 DIAGNOSIS — N189 Chronic kidney disease, unspecified: Secondary | ICD-10-CM | POA: Diagnosis not present

## 2020-11-18 DIAGNOSIS — L0291 Cutaneous abscess, unspecified: Secondary | ICD-10-CM | POA: Diagnosis not present

## 2020-11-18 DIAGNOSIS — J309 Allergic rhinitis, unspecified: Secondary | ICD-10-CM | POA: Diagnosis not present

## 2020-11-18 DIAGNOSIS — R2681 Unsteadiness on feet: Secondary | ICD-10-CM | POA: Diagnosis not present

## 2020-11-18 DIAGNOSIS — F419 Anxiety disorder, unspecified: Secondary | ICD-10-CM | POA: Diagnosis not present

## 2020-11-18 DIAGNOSIS — R7989 Other specified abnormal findings of blood chemistry: Secondary | ICD-10-CM | POA: Diagnosis not present

## 2020-11-18 DIAGNOSIS — E1169 Type 2 diabetes mellitus with other specified complication: Secondary | ICD-10-CM | POA: Diagnosis not present

## 2020-11-18 DIAGNOSIS — I5022 Chronic systolic (congestive) heart failure: Secondary | ICD-10-CM | POA: Diagnosis not present

## 2020-11-18 DIAGNOSIS — I482 Chronic atrial fibrillation, unspecified: Secondary | ICD-10-CM | POA: Diagnosis not present

## 2020-11-22 DIAGNOSIS — I482 Chronic atrial fibrillation, unspecified: Secondary | ICD-10-CM | POA: Diagnosis not present

## 2020-11-22 DIAGNOSIS — I5022 Chronic systolic (congestive) heart failure: Secondary | ICD-10-CM | POA: Diagnosis not present

## 2020-11-22 DIAGNOSIS — N189 Chronic kidney disease, unspecified: Secondary | ICD-10-CM | POA: Diagnosis not present

## 2020-11-22 DIAGNOSIS — E1169 Type 2 diabetes mellitus with other specified complication: Secondary | ICD-10-CM | POA: Diagnosis not present

## 2020-11-22 DIAGNOSIS — E669 Obesity, unspecified: Secondary | ICD-10-CM | POA: Diagnosis not present

## 2020-11-22 DIAGNOSIS — J309 Allergic rhinitis, unspecified: Secondary | ICD-10-CM | POA: Diagnosis not present

## 2020-11-22 DIAGNOSIS — R569 Unspecified convulsions: Secondary | ICD-10-CM | POA: Diagnosis not present

## 2020-11-22 DIAGNOSIS — I4892 Unspecified atrial flutter: Secondary | ICD-10-CM | POA: Diagnosis not present

## 2020-11-22 DIAGNOSIS — R2681 Unsteadiness on feet: Secondary | ICD-10-CM | POA: Diagnosis not present

## 2020-11-22 DIAGNOSIS — R7989 Other specified abnormal findings of blood chemistry: Secondary | ICD-10-CM | POA: Diagnosis not present

## 2020-11-22 DIAGNOSIS — F419 Anxiety disorder, unspecified: Secondary | ICD-10-CM | POA: Diagnosis not present

## 2020-11-22 DIAGNOSIS — M159 Polyosteoarthritis, unspecified: Secondary | ICD-10-CM | POA: Diagnosis not present

## 2020-11-29 DIAGNOSIS — R2681 Unsteadiness on feet: Secondary | ICD-10-CM | POA: Diagnosis not present

## 2020-11-29 DIAGNOSIS — E669 Obesity, unspecified: Secondary | ICD-10-CM | POA: Diagnosis not present

## 2020-11-29 DIAGNOSIS — R569 Unspecified convulsions: Secondary | ICD-10-CM | POA: Diagnosis not present

## 2020-11-29 DIAGNOSIS — E1169 Type 2 diabetes mellitus with other specified complication: Secondary | ICD-10-CM | POA: Diagnosis not present

## 2020-11-29 DIAGNOSIS — M159 Polyosteoarthritis, unspecified: Secondary | ICD-10-CM | POA: Diagnosis not present

## 2020-11-29 DIAGNOSIS — J309 Allergic rhinitis, unspecified: Secondary | ICD-10-CM | POA: Diagnosis not present

## 2020-11-29 DIAGNOSIS — R7989 Other specified abnormal findings of blood chemistry: Secondary | ICD-10-CM | POA: Diagnosis not present

## 2020-11-29 DIAGNOSIS — I5022 Chronic systolic (congestive) heart failure: Secondary | ICD-10-CM | POA: Diagnosis not present

## 2020-11-29 DIAGNOSIS — F419 Anxiety disorder, unspecified: Secondary | ICD-10-CM | POA: Diagnosis not present

## 2020-11-29 DIAGNOSIS — I482 Chronic atrial fibrillation, unspecified: Secondary | ICD-10-CM | POA: Diagnosis not present

## 2020-11-29 DIAGNOSIS — N189 Chronic kidney disease, unspecified: Secondary | ICD-10-CM | POA: Diagnosis not present

## 2020-11-29 DIAGNOSIS — I4892 Unspecified atrial flutter: Secondary | ICD-10-CM | POA: Diagnosis not present

## 2020-12-08 DIAGNOSIS — M25512 Pain in left shoulder: Secondary | ICD-10-CM | POA: Diagnosis not present

## 2020-12-08 DIAGNOSIS — R7989 Other specified abnormal findings of blood chemistry: Secondary | ICD-10-CM | POA: Diagnosis not present

## 2020-12-08 DIAGNOSIS — R569 Unspecified convulsions: Secondary | ICD-10-CM | POA: Diagnosis not present

## 2020-12-08 DIAGNOSIS — I4892 Unspecified atrial flutter: Secondary | ICD-10-CM | POA: Diagnosis not present

## 2020-12-08 DIAGNOSIS — F3341 Major depressive disorder, recurrent, in partial remission: Secondary | ICD-10-CM | POA: Diagnosis not present

## 2020-12-08 DIAGNOSIS — J309 Allergic rhinitis, unspecified: Secondary | ICD-10-CM | POA: Diagnosis not present

## 2020-12-08 DIAGNOSIS — M159 Polyosteoarthritis, unspecified: Secondary | ICD-10-CM | POA: Diagnosis not present

## 2020-12-08 DIAGNOSIS — E1169 Type 2 diabetes mellitus with other specified complication: Secondary | ICD-10-CM | POA: Diagnosis not present

## 2020-12-08 DIAGNOSIS — I482 Chronic atrial fibrillation, unspecified: Secondary | ICD-10-CM | POA: Diagnosis not present

## 2020-12-08 DIAGNOSIS — R2681 Unsteadiness on feet: Secondary | ICD-10-CM | POA: Diagnosis not present

## 2020-12-08 DIAGNOSIS — F419 Anxiety disorder, unspecified: Secondary | ICD-10-CM | POA: Diagnosis not present

## 2020-12-08 DIAGNOSIS — E669 Obesity, unspecified: Secondary | ICD-10-CM | POA: Diagnosis not present

## 2020-12-11 ENCOUNTER — Other Ambulatory Visit: Payer: Self-pay | Admitting: Internal Medicine

## 2020-12-13 DIAGNOSIS — I482 Chronic atrial fibrillation, unspecified: Secondary | ICD-10-CM | POA: Diagnosis not present

## 2020-12-13 DIAGNOSIS — R2681 Unsteadiness on feet: Secondary | ICD-10-CM | POA: Diagnosis not present

## 2020-12-13 DIAGNOSIS — J309 Allergic rhinitis, unspecified: Secondary | ICD-10-CM | POA: Diagnosis not present

## 2020-12-13 DIAGNOSIS — N189 Chronic kidney disease, unspecified: Secondary | ICD-10-CM | POA: Diagnosis not present

## 2020-12-13 DIAGNOSIS — F419 Anxiety disorder, unspecified: Secondary | ICD-10-CM | POA: Diagnosis not present

## 2020-12-13 DIAGNOSIS — E1169 Type 2 diabetes mellitus with other specified complication: Secondary | ICD-10-CM | POA: Diagnosis not present

## 2020-12-13 DIAGNOSIS — E669 Obesity, unspecified: Secondary | ICD-10-CM | POA: Diagnosis not present

## 2020-12-13 DIAGNOSIS — I4892 Unspecified atrial flutter: Secondary | ICD-10-CM | POA: Diagnosis not present

## 2020-12-13 DIAGNOSIS — R569 Unspecified convulsions: Secondary | ICD-10-CM | POA: Diagnosis not present

## 2020-12-13 DIAGNOSIS — R7989 Other specified abnormal findings of blood chemistry: Secondary | ICD-10-CM | POA: Diagnosis not present

## 2020-12-13 DIAGNOSIS — M159 Polyosteoarthritis, unspecified: Secondary | ICD-10-CM | POA: Diagnosis not present

## 2020-12-13 DIAGNOSIS — F3341 Major depressive disorder, recurrent, in partial remission: Secondary | ICD-10-CM | POA: Diagnosis not present

## 2020-12-28 ENCOUNTER — Other Ambulatory Visit: Payer: Self-pay | Admitting: Internal Medicine

## 2020-12-28 DIAGNOSIS — E1169 Type 2 diabetes mellitus with other specified complication: Secondary | ICD-10-CM | POA: Diagnosis not present

## 2020-12-28 DIAGNOSIS — R569 Unspecified convulsions: Secondary | ICD-10-CM | POA: Diagnosis not present

## 2020-12-28 DIAGNOSIS — R2681 Unsteadiness on feet: Secondary | ICD-10-CM | POA: Diagnosis not present

## 2020-12-28 DIAGNOSIS — M159 Polyosteoarthritis, unspecified: Secondary | ICD-10-CM | POA: Diagnosis not present

## 2020-12-28 DIAGNOSIS — F3341 Major depressive disorder, recurrent, in partial remission: Secondary | ICD-10-CM | POA: Diagnosis not present

## 2020-12-28 DIAGNOSIS — N189 Chronic kidney disease, unspecified: Secondary | ICD-10-CM | POA: Diagnosis not present

## 2020-12-28 DIAGNOSIS — R7989 Other specified abnormal findings of blood chemistry: Secondary | ICD-10-CM | POA: Diagnosis not present

## 2020-12-28 DIAGNOSIS — J309 Allergic rhinitis, unspecified: Secondary | ICD-10-CM | POA: Diagnosis not present

## 2020-12-28 DIAGNOSIS — E669 Obesity, unspecified: Secondary | ICD-10-CM | POA: Diagnosis not present

## 2020-12-28 DIAGNOSIS — I4892 Unspecified atrial flutter: Secondary | ICD-10-CM | POA: Diagnosis not present

## 2020-12-28 DIAGNOSIS — F419 Anxiety disorder, unspecified: Secondary | ICD-10-CM | POA: Diagnosis not present

## 2020-12-28 DIAGNOSIS — I482 Chronic atrial fibrillation, unspecified: Secondary | ICD-10-CM | POA: Diagnosis not present

## 2021-01-12 DIAGNOSIS — F419 Anxiety disorder, unspecified: Secondary | ICD-10-CM | POA: Diagnosis not present

## 2021-01-12 DIAGNOSIS — E669 Obesity, unspecified: Secondary | ICD-10-CM | POA: Diagnosis not present

## 2021-01-12 DIAGNOSIS — E1169 Type 2 diabetes mellitus with other specified complication: Secondary | ICD-10-CM | POA: Diagnosis not present

## 2021-01-12 DIAGNOSIS — R569 Unspecified convulsions: Secondary | ICD-10-CM | POA: Diagnosis not present

## 2021-01-12 DIAGNOSIS — F3341 Major depressive disorder, recurrent, in partial remission: Secondary | ICD-10-CM | POA: Diagnosis not present

## 2021-01-12 DIAGNOSIS — I4892 Unspecified atrial flutter: Secondary | ICD-10-CM | POA: Diagnosis not present

## 2021-01-12 DIAGNOSIS — J309 Allergic rhinitis, unspecified: Secondary | ICD-10-CM | POA: Diagnosis not present

## 2021-01-12 DIAGNOSIS — N189 Chronic kidney disease, unspecified: Secondary | ICD-10-CM | POA: Diagnosis not present

## 2021-01-12 DIAGNOSIS — R7989 Other specified abnormal findings of blood chemistry: Secondary | ICD-10-CM | POA: Diagnosis not present

## 2021-01-12 DIAGNOSIS — R2681 Unsteadiness on feet: Secondary | ICD-10-CM | POA: Diagnosis not present

## 2021-01-12 DIAGNOSIS — I482 Chronic atrial fibrillation, unspecified: Secondary | ICD-10-CM | POA: Diagnosis not present

## 2021-01-12 DIAGNOSIS — M159 Polyosteoarthritis, unspecified: Secondary | ICD-10-CM | POA: Diagnosis not present

## 2021-02-09 DIAGNOSIS — E1169 Type 2 diabetes mellitus with other specified complication: Secondary | ICD-10-CM | POA: Diagnosis not present

## 2021-02-09 DIAGNOSIS — I4892 Unspecified atrial flutter: Secondary | ICD-10-CM | POA: Diagnosis not present

## 2021-02-09 DIAGNOSIS — F419 Anxiety disorder, unspecified: Secondary | ICD-10-CM | POA: Diagnosis not present

## 2021-02-09 DIAGNOSIS — J309 Allergic rhinitis, unspecified: Secondary | ICD-10-CM | POA: Diagnosis not present

## 2021-02-09 DIAGNOSIS — R2681 Unsteadiness on feet: Secondary | ICD-10-CM | POA: Diagnosis not present

## 2021-02-09 DIAGNOSIS — F3341 Major depressive disorder, recurrent, in partial remission: Secondary | ICD-10-CM | POA: Diagnosis not present

## 2021-02-09 DIAGNOSIS — E669 Obesity, unspecified: Secondary | ICD-10-CM | POA: Diagnosis not present

## 2021-02-09 DIAGNOSIS — N189 Chronic kidney disease, unspecified: Secondary | ICD-10-CM | POA: Diagnosis not present

## 2021-02-09 DIAGNOSIS — R569 Unspecified convulsions: Secondary | ICD-10-CM | POA: Diagnosis not present

## 2021-02-09 DIAGNOSIS — I482 Chronic atrial fibrillation, unspecified: Secondary | ICD-10-CM | POA: Diagnosis not present

## 2021-02-09 DIAGNOSIS — M159 Polyosteoarthritis, unspecified: Secondary | ICD-10-CM | POA: Diagnosis not present

## 2021-02-09 DIAGNOSIS — R7989 Other specified abnormal findings of blood chemistry: Secondary | ICD-10-CM | POA: Diagnosis not present

## 2021-02-28 ENCOUNTER — Ambulatory Visit: Payer: HMO | Admitting: Neurology

## 2021-03-09 DIAGNOSIS — J309 Allergic rhinitis, unspecified: Secondary | ICD-10-CM | POA: Diagnosis not present

## 2021-03-09 DIAGNOSIS — M159 Polyosteoarthritis, unspecified: Secondary | ICD-10-CM | POA: Diagnosis not present

## 2021-03-09 DIAGNOSIS — E669 Obesity, unspecified: Secondary | ICD-10-CM | POA: Diagnosis not present

## 2021-03-09 DIAGNOSIS — I4892 Unspecified atrial flutter: Secondary | ICD-10-CM | POA: Diagnosis not present

## 2021-03-09 DIAGNOSIS — I482 Chronic atrial fibrillation, unspecified: Secondary | ICD-10-CM | POA: Diagnosis not present

## 2021-03-09 DIAGNOSIS — F3341 Major depressive disorder, recurrent, in partial remission: Secondary | ICD-10-CM | POA: Diagnosis not present

## 2021-03-09 DIAGNOSIS — R569 Unspecified convulsions: Secondary | ICD-10-CM | POA: Diagnosis not present

## 2021-03-09 DIAGNOSIS — R7989 Other specified abnormal findings of blood chemistry: Secondary | ICD-10-CM | POA: Diagnosis not present

## 2021-03-09 DIAGNOSIS — R2681 Unsteadiness on feet: Secondary | ICD-10-CM | POA: Diagnosis not present

## 2021-03-09 DIAGNOSIS — E1169 Type 2 diabetes mellitus with other specified complication: Secondary | ICD-10-CM | POA: Diagnosis not present

## 2021-03-09 DIAGNOSIS — F419 Anxiety disorder, unspecified: Secondary | ICD-10-CM | POA: Diagnosis not present

## 2021-03-09 DIAGNOSIS — L0291 Cutaneous abscess, unspecified: Secondary | ICD-10-CM | POA: Diagnosis not present

## 2021-03-14 ENCOUNTER — Encounter (HOSPITAL_COMMUNITY): Payer: Self-pay | Admitting: Emergency Medicine

## 2021-03-14 ENCOUNTER — Emergency Department (HOSPITAL_COMMUNITY): Payer: HMO

## 2021-03-14 ENCOUNTER — Other Ambulatory Visit: Payer: Self-pay

## 2021-03-14 ENCOUNTER — Inpatient Hospital Stay (HOSPITAL_COMMUNITY)
Admission: EM | Admit: 2021-03-14 | Discharge: 2021-03-17 | DRG: 481 | Disposition: A | Discharge status: Home Health | Payer: HMO | Attending: Internal Medicine | Admitting: Internal Medicine

## 2021-03-14 DIAGNOSIS — M79604 Pain in right leg: Secondary | ICD-10-CM | POA: Diagnosis not present

## 2021-03-14 DIAGNOSIS — S8991XA Unspecified injury of right lower leg, initial encounter: Secondary | ICD-10-CM | POA: Diagnosis not present

## 2021-03-14 DIAGNOSIS — E782 Mixed hyperlipidemia: Secondary | ICD-10-CM

## 2021-03-14 DIAGNOSIS — I480 Paroxysmal atrial fibrillation: Secondary | ICD-10-CM | POA: Diagnosis present

## 2021-03-14 DIAGNOSIS — E871 Hypo-osmolality and hyponatremia: Secondary | ICD-10-CM | POA: Diagnosis not present

## 2021-03-14 DIAGNOSIS — N1832 Chronic kidney disease, stage 3b: Secondary | ICD-10-CM | POA: Diagnosis not present

## 2021-03-14 DIAGNOSIS — E039 Hypothyroidism, unspecified: Secondary | ICD-10-CM | POA: Diagnosis not present

## 2021-03-14 DIAGNOSIS — S7291XD Unspecified fracture of right femur, subsequent encounter for closed fracture with routine healing: Secondary | ICD-10-CM | POA: Diagnosis not present

## 2021-03-14 DIAGNOSIS — Z87891 Personal history of nicotine dependence: Secondary | ICD-10-CM

## 2021-03-14 DIAGNOSIS — J309 Allergic rhinitis, unspecified: Secondary | ICD-10-CM | POA: Diagnosis not present

## 2021-03-14 DIAGNOSIS — Z7901 Long term (current) use of anticoagulants: Secondary | ICD-10-CM

## 2021-03-14 DIAGNOSIS — M25551 Pain in right hip: Secondary | ICD-10-CM | POA: Diagnosis not present

## 2021-03-14 DIAGNOSIS — N189 Chronic kidney disease, unspecified: Secondary | ICD-10-CM | POA: Diagnosis not present

## 2021-03-14 DIAGNOSIS — I428 Other cardiomyopathies: Secondary | ICD-10-CM | POA: Diagnosis present

## 2021-03-14 DIAGNOSIS — Z20822 Contact with and (suspected) exposure to covid-19: Secondary | ICD-10-CM | POA: Diagnosis not present

## 2021-03-14 DIAGNOSIS — Z79890 Hormone replacement therapy (postmenopausal): Secondary | ICD-10-CM | POA: Diagnosis not present

## 2021-03-14 DIAGNOSIS — E1169 Type 2 diabetes mellitus with other specified complication: Secondary | ICD-10-CM | POA: Diagnosis not present

## 2021-03-14 DIAGNOSIS — Z79899 Other long term (current) drug therapy: Secondary | ICD-10-CM | POA: Diagnosis not present

## 2021-03-14 DIAGNOSIS — D509 Iron deficiency anemia, unspecified: Secondary | ICD-10-CM | POA: Diagnosis not present

## 2021-03-14 DIAGNOSIS — Z885 Allergy status to narcotic agent status: Secondary | ICD-10-CM

## 2021-03-14 DIAGNOSIS — F3341 Major depressive disorder, recurrent, in partial remission: Secondary | ICD-10-CM | POA: Diagnosis not present

## 2021-03-14 DIAGNOSIS — Z8249 Family history of ischemic heart disease and other diseases of the circulatory system: Secondary | ICD-10-CM

## 2021-03-14 DIAGNOSIS — R7989 Other specified abnormal findings of blood chemistry: Secondary | ICD-10-CM | POA: Diagnosis not present

## 2021-03-14 DIAGNOSIS — I2510 Atherosclerotic heart disease of native coronary artery without angina pectoris: Secondary | ICD-10-CM | POA: Diagnosis present

## 2021-03-14 DIAGNOSIS — Z888 Allergy status to other drugs, medicaments and biological substances status: Secondary | ICD-10-CM | POA: Diagnosis not present

## 2021-03-14 DIAGNOSIS — Z419 Encounter for procedure for purposes other than remedying health state, unspecified: Secondary | ICD-10-CM

## 2021-03-14 DIAGNOSIS — E7800 Pure hypercholesterolemia, unspecified: Secondary | ICD-10-CM | POA: Diagnosis present

## 2021-03-14 DIAGNOSIS — D72829 Elevated white blood cell count, unspecified: Secondary | ICD-10-CM | POA: Diagnosis present

## 2021-03-14 DIAGNOSIS — E1122 Type 2 diabetes mellitus with diabetic chronic kidney disease: Secondary | ICD-10-CM | POA: Diagnosis present

## 2021-03-14 DIAGNOSIS — D62 Acute posthemorrhagic anemia: Secondary | ICD-10-CM | POA: Diagnosis not present

## 2021-03-14 DIAGNOSIS — S72001A Fracture of unspecified part of neck of right femur, initial encounter for closed fracture: Secondary | ICD-10-CM | POA: Diagnosis present

## 2021-03-14 DIAGNOSIS — R569 Unspecified convulsions: Secondary | ICD-10-CM | POA: Diagnosis not present

## 2021-03-14 DIAGNOSIS — I5042 Chronic combined systolic (congestive) and diastolic (congestive) heart failure: Secondary | ICD-10-CM | POA: Diagnosis present

## 2021-03-14 DIAGNOSIS — E861 Hypovolemia: Secondary | ICD-10-CM | POA: Diagnosis not present

## 2021-03-14 DIAGNOSIS — F411 Generalized anxiety disorder: Secondary | ICD-10-CM | POA: Diagnosis not present

## 2021-03-14 DIAGNOSIS — I130 Hypertensive heart and chronic kidney disease with heart failure and stage 1 through stage 4 chronic kidney disease, or unspecified chronic kidney disease: Secondary | ICD-10-CM | POA: Diagnosis present

## 2021-03-14 DIAGNOSIS — E669 Obesity, unspecified: Secondary | ICD-10-CM | POA: Diagnosis not present

## 2021-03-14 DIAGNOSIS — E0590 Thyrotoxicosis, unspecified without thyrotoxic crisis or storm: Secondary | ICD-10-CM | POA: Diagnosis not present

## 2021-03-14 DIAGNOSIS — E872 Acidosis: Secondary | ICD-10-CM | POA: Diagnosis not present

## 2021-03-14 DIAGNOSIS — I4892 Unspecified atrial flutter: Secondary | ICD-10-CM | POA: Diagnosis not present

## 2021-03-14 DIAGNOSIS — S72142A Displaced intertrochanteric fracture of left femur, initial encounter for closed fracture: Secondary | ICD-10-CM | POA: Diagnosis not present

## 2021-03-14 DIAGNOSIS — F32A Depression, unspecified: Secondary | ICD-10-CM | POA: Diagnosis present

## 2021-03-14 DIAGNOSIS — I10 Essential (primary) hypertension: Secondary | ICD-10-CM | POA: Diagnosis not present

## 2021-03-14 DIAGNOSIS — Z043 Encounter for examination and observation following other accident: Secondary | ICD-10-CM | POA: Diagnosis not present

## 2021-03-14 DIAGNOSIS — R0902 Hypoxemia: Secondary | ICD-10-CM | POA: Diagnosis not present

## 2021-03-14 DIAGNOSIS — E875 Hyperkalemia: Secondary | ICD-10-CM | POA: Diagnosis present

## 2021-03-14 DIAGNOSIS — S7290XA Unspecified fracture of unspecified femur, initial encounter for closed fracture: Secondary | ICD-10-CM

## 2021-03-14 DIAGNOSIS — Z83438 Family history of other disorder of lipoprotein metabolism and other lipidemia: Secondary | ICD-10-CM

## 2021-03-14 DIAGNOSIS — I482 Chronic atrial fibrillation, unspecified: Secondary | ICD-10-CM | POA: Diagnosis not present

## 2021-03-14 DIAGNOSIS — S72141A Displaced intertrochanteric fracture of right femur, initial encounter for closed fracture: Principal | ICD-10-CM | POA: Diagnosis present

## 2021-03-14 DIAGNOSIS — I5022 Chronic systolic (congestive) heart failure: Secondary | ICD-10-CM | POA: Diagnosis not present

## 2021-03-14 DIAGNOSIS — R404 Transient alteration of awareness: Secondary | ICD-10-CM | POA: Diagnosis not present

## 2021-03-14 DIAGNOSIS — M25512 Pain in left shoulder: Secondary | ICD-10-CM | POA: Diagnosis not present

## 2021-03-14 DIAGNOSIS — Z823 Family history of stroke: Secondary | ICD-10-CM

## 2021-03-14 DIAGNOSIS — Z743 Need for continuous supervision: Secondary | ICD-10-CM | POA: Diagnosis not present

## 2021-03-14 DIAGNOSIS — R2681 Unsteadiness on feet: Secondary | ICD-10-CM | POA: Diagnosis not present

## 2021-03-14 HISTORY — DX: Generalized anxiety disorder: F41.1

## 2021-03-14 HISTORY — DX: Fracture of unspecified part of neck of right femur, initial encounter for closed fracture: S72.001A

## 2021-03-14 HISTORY — DX: Type 2 diabetes mellitus with other specified complication: E11.69

## 2021-03-14 HISTORY — DX: Elevated white blood cell count, unspecified: D72.829

## 2021-03-14 LAB — CBC WITH DIFFERENTIAL/PLATELET
Abs Immature Granulocytes: 0.17 10*3/uL — ABNORMAL HIGH (ref 0.00–0.07)
Basophils Absolute: 0.1 10*3/uL (ref 0.0–0.1)
Basophils Relative: 0 %
Eosinophils Absolute: 0.2 10*3/uL (ref 0.0–0.5)
Eosinophils Relative: 1 %
HCT: 33.9 % — ABNORMAL LOW (ref 36.0–46.0)
Hemoglobin: 10 g/dL — ABNORMAL LOW (ref 12.0–15.0)
Immature Granulocytes: 1 %
Lymphocytes Relative: 12 %
Lymphs Abs: 2 10*3/uL (ref 0.7–4.0)
MCH: 29.1 pg (ref 26.0–34.0)
MCHC: 29.5 g/dL — ABNORMAL LOW (ref 30.0–36.0)
MCV: 98.5 fL (ref 80.0–100.0)
Monocytes Absolute: 1 10*3/uL (ref 0.1–1.0)
Monocytes Relative: 6 %
Neutro Abs: 13.7 10*3/uL — ABNORMAL HIGH (ref 1.7–7.7)
Neutrophils Relative %: 80 %
Platelets: 346 10*3/uL (ref 150–400)
RBC: 3.44 MIL/uL — ABNORMAL LOW (ref 3.87–5.11)
RDW: 13.5 % (ref 11.5–15.5)
WBC: 17 10*3/uL — ABNORMAL HIGH (ref 4.0–10.5)
nRBC: 0 % (ref 0.0–0.2)

## 2021-03-14 LAB — RESP PANEL BY RT-PCR (FLU A&B, COVID) ARPGX2
Influenza A by PCR: NEGATIVE
Influenza B by PCR: NEGATIVE
SARS Coronavirus 2 by RT PCR: NEGATIVE

## 2021-03-14 LAB — COMPREHENSIVE METABOLIC PANEL
ALT: 23 U/L (ref 0–44)
AST: 23 U/L (ref 15–41)
Albumin: 3.9 g/dL (ref 3.5–5.0)
Alkaline Phosphatase: 74 U/L (ref 38–126)
Anion gap: 9 (ref 5–15)
BUN: 26 mg/dL — ABNORMAL HIGH (ref 6–20)
CO2: 17 mmol/L — ABNORMAL LOW (ref 22–32)
Calcium: 9.1 mg/dL (ref 8.9–10.3)
Chloride: 107 mmol/L (ref 98–111)
Creatinine, Ser: 1.52 mg/dL — ABNORMAL HIGH (ref 0.44–1.00)
GFR, Estimated: 39 mL/min — ABNORMAL LOW (ref >60)
Glucose, Bld: 135 mg/dL — ABNORMAL HIGH (ref 70–99)
Potassium: 5.7 mmol/L — ABNORMAL HIGH (ref 3.5–5.1)
Sodium: 133 mmol/L — ABNORMAL LOW (ref 135–145)
Total Bilirubin: 0.2 mg/dL — ABNORMAL LOW (ref 0.3–1.2)
Total Protein: 6.8 g/dL (ref 6.5–8.1)

## 2021-03-14 IMAGING — DX DG HIP (WITH OR WITHOUT PELVIS) 2-3V*R*
3 series · 3 of 3 positions shown · non-contrast
Comparison: None.

CLINICAL DATA: 59-year-old female with fall and right hip pain.

EXAM:
DG HIP (WITH OR WITHOUT PELVIS) 2-3V RIGHT

[pelvis ap]
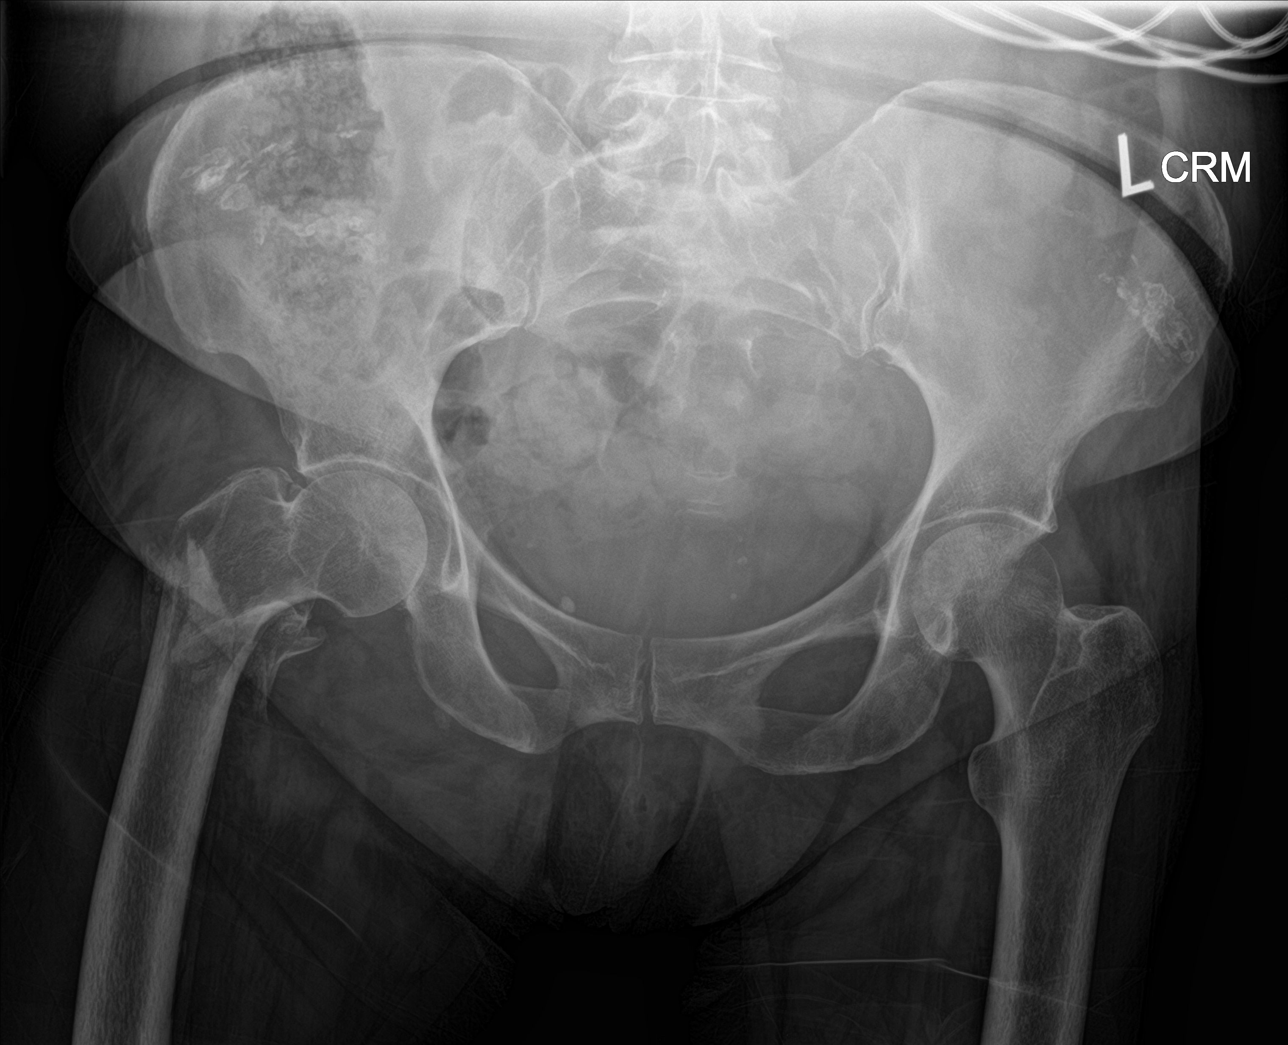

[hip ap]
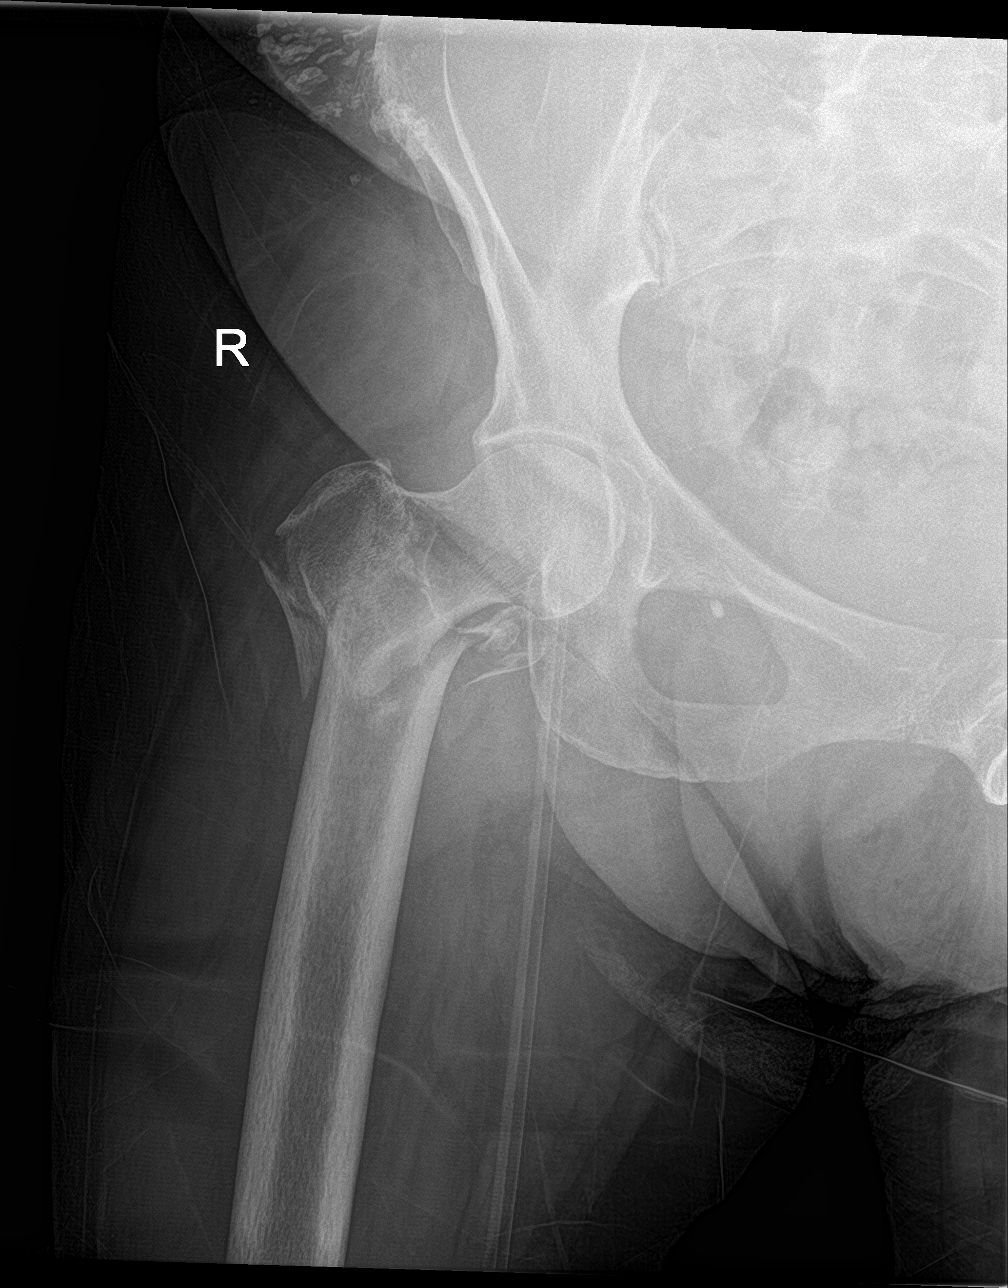

[hip lat]
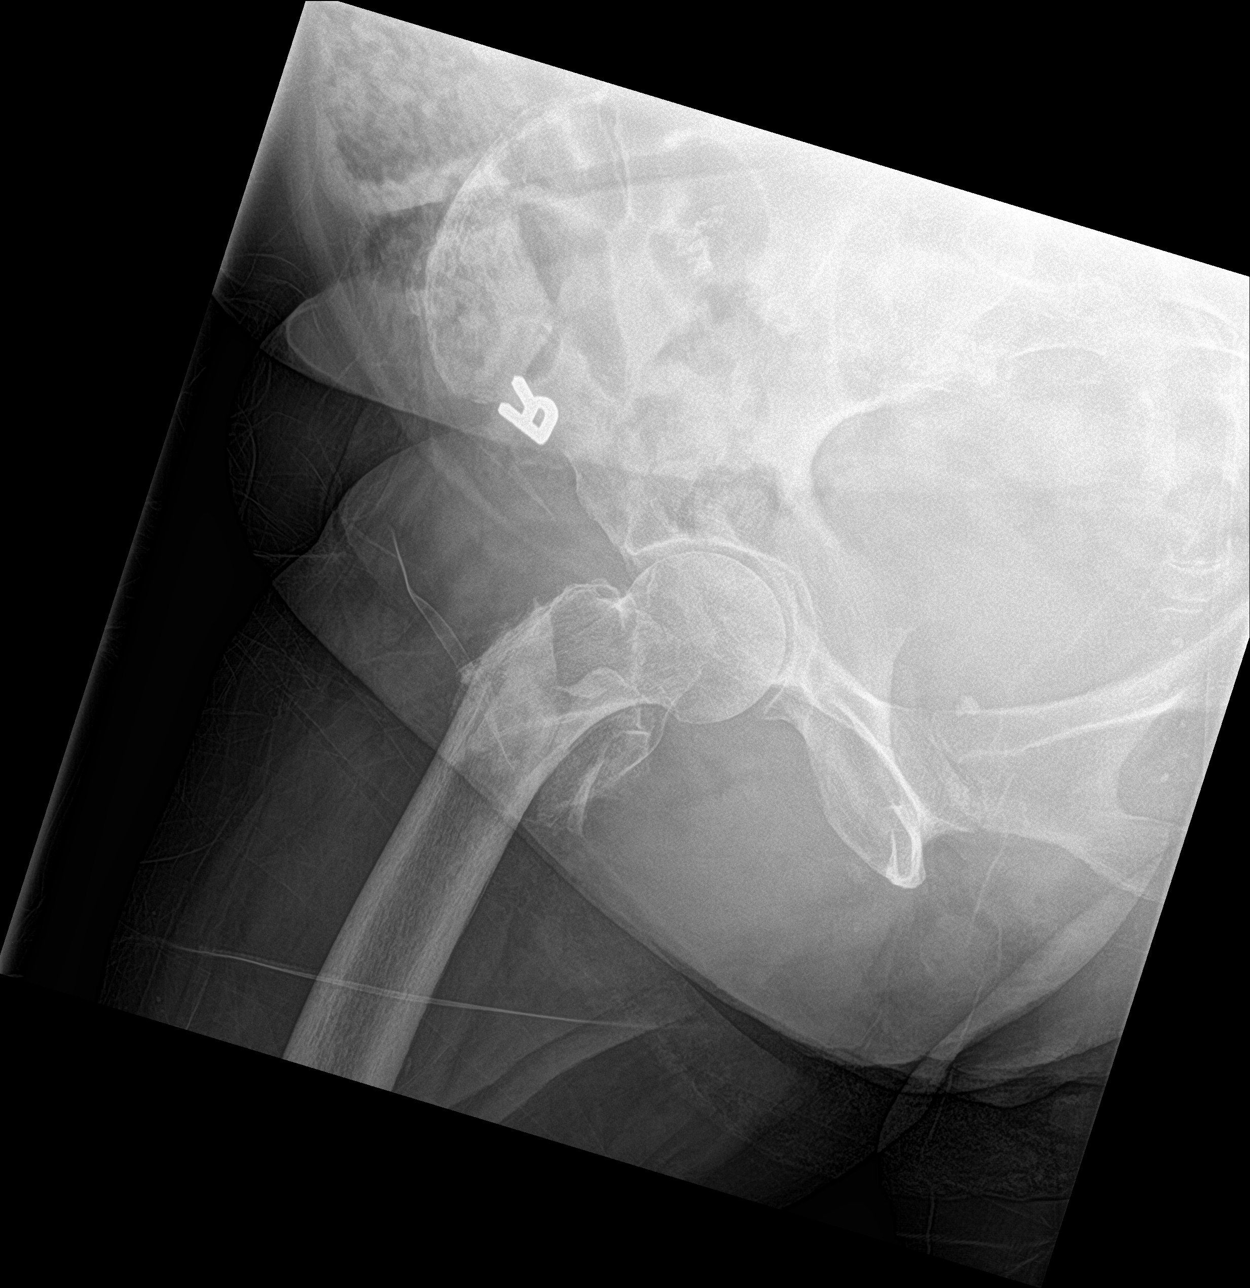

[3 of 3 positions shown; findings below may reference images not displayed]

FINDINGS: There is a comminuted mildly displaced fracture of the right femoral
neck involving the intertrochanteric and subtrochanteric region. No
dislocation. The bones are osteopenic. The soft tissues are grossly
unremarkable.
IMPRESSION: Mildly displaced comminuted fracture of the right femoral neck. No
dislocation.

## 2021-03-14 IMAGING — DX DG CHEST 1V
1 series · 1 of 1 positions shown · non-contrast
Comparison: [DATE]

CLINICAL DATA: Status post fall.

EXAM:
CHEST  1 VIEW

[chest ap]
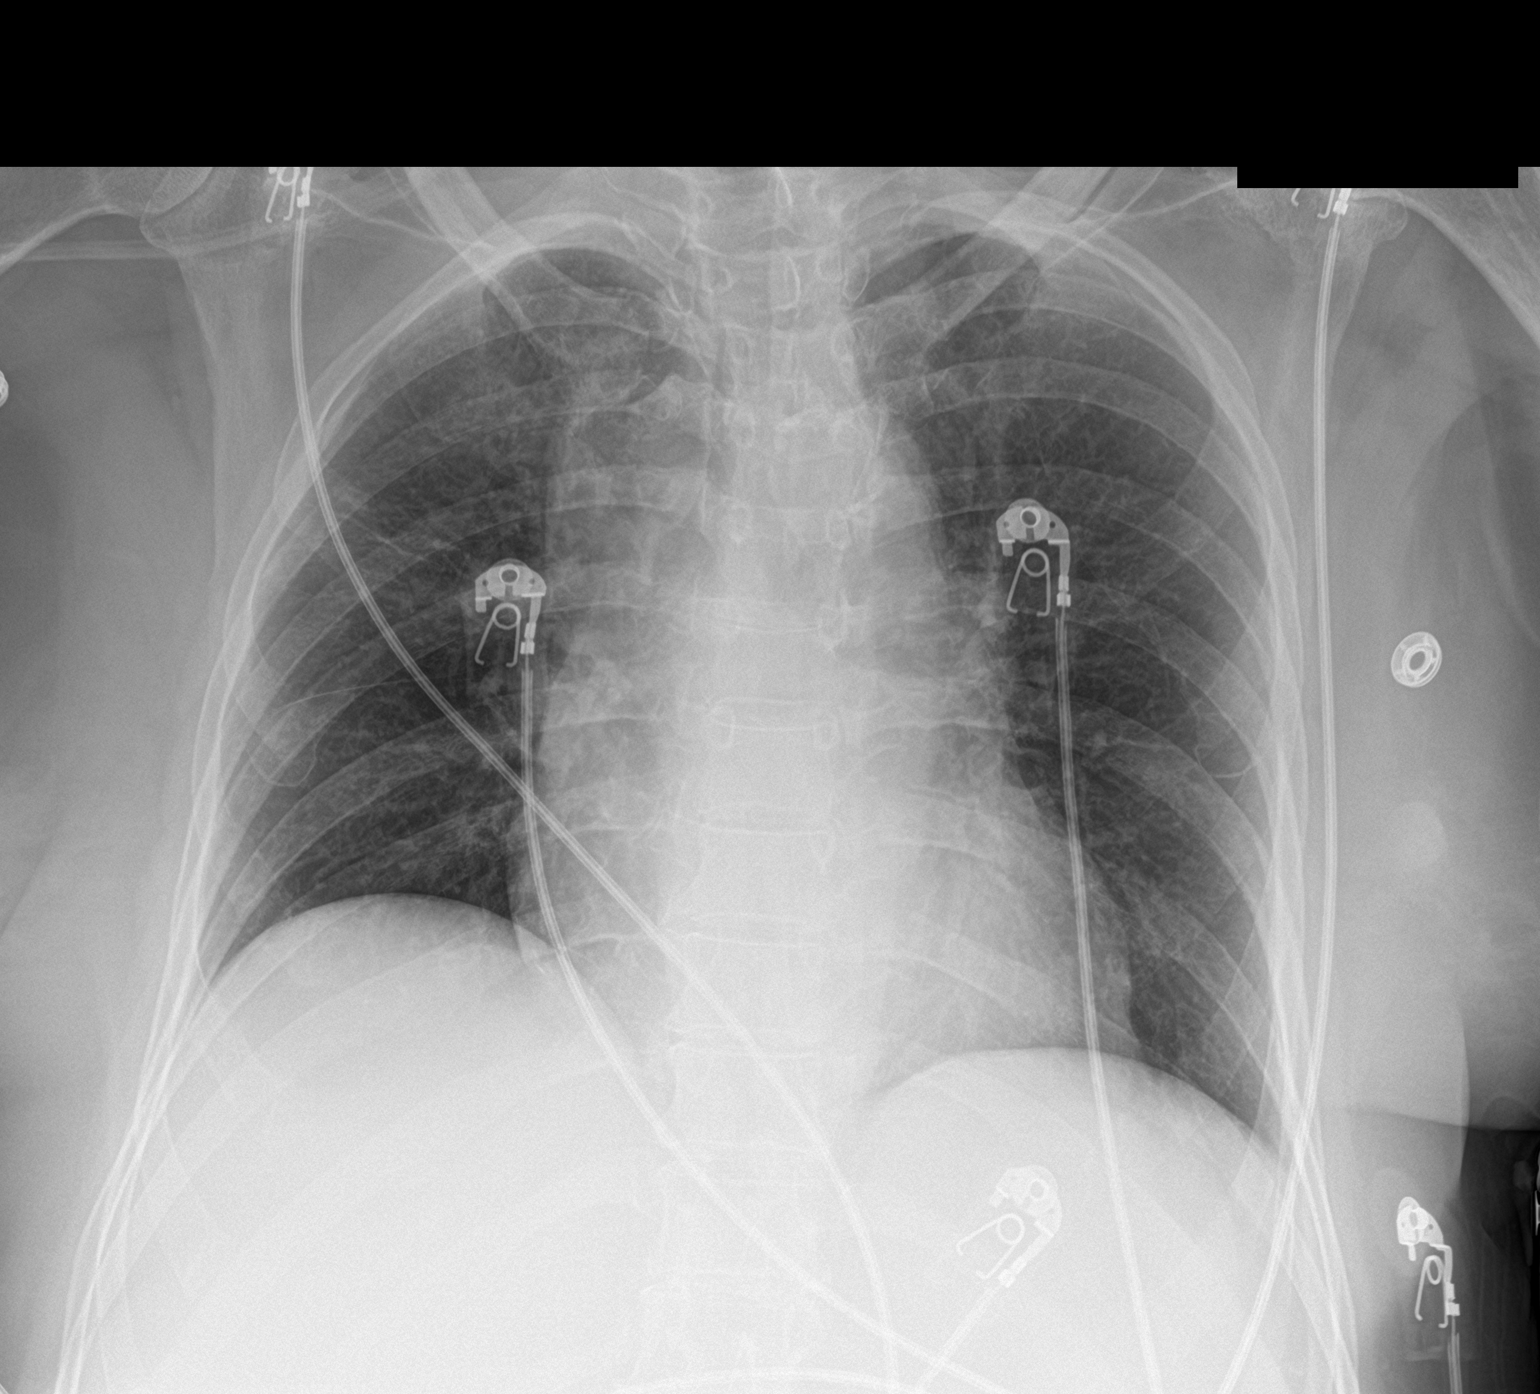

[1 of 1 positions shown; findings below may reference images not displayed]

FINDINGS: Very mild, stable linear scarring and/or atelectasis is seen within
the upper right lung. There is no evidence of acute infiltrate,
pleural effusion or pneumothorax. The heart size and mediastinal
contours are within normal limits. The visualized skeletal
structures are unremarkable.
IMPRESSION: Very mild right upper lobe linear scarring and/or atelectasis.

## 2021-03-14 MED ORDER — ONDANSETRON HCL 4 MG/2ML IJ SOLN: 4.0000 mg | Freq: Four times a day (QID) | INTRAMUSCULAR | Status: DC | PRN

## 2021-03-14 MED ORDER — ALPRAZOLAM 0.25 MG PO TABS
0.5000 mg | ORAL_TABLET | Freq: Three times a day (TID) | ORAL | Status: DC | PRN
  Filled 2021-03-14: qty 2

## 2021-03-14 MED ORDER — AMLODIPINE BESYLATE 10 MG PO TABS
10.0000 mg | ORAL_TABLET | Freq: Every day | ORAL | Status: DC
  Administered 2021-03-16 – 2021-03-17 (×2): 10 mg via ORAL
  Filled 2021-03-14 (×2): qty 1

## 2021-03-14 MED ORDER — MORPHINE SULFATE (PF) 4 MG/ML IV SOLN
4.0000 mg | INTRAVENOUS | Status: DC | PRN
Start: 2021-03-14 — End: 2021-03-15

## 2021-03-14 MED ORDER — FENOFIBRATE 54 MG PO TABS
54.0000 mg | ORAL_TABLET | Freq: Every day | ORAL | Status: DC
  Administered 2021-03-16 – 2021-03-17 (×2): 54 mg via ORAL
  Filled 2021-03-14 (×3): qty 1

## 2021-03-14 MED ORDER — SODIUM CHLORIDE 0.9 % IV SOLN: INTRAVENOUS | Status: DC

## 2021-03-14 MED ORDER — LEVETIRACETAM 500 MG PO TABS
500.0000 mg | ORAL_TABLET | Freq: Two times a day (BID) | ORAL | Status: DC
  Administered 2021-03-15 – 2021-03-17 (×4): 500 mg via ORAL
  Filled 2021-03-14 (×4): qty 1

## 2021-03-14 MED ORDER — INSULIN ASPART 100 UNIT/ML IJ SOLN
0.0000 [IU] | Freq: Three times a day (TID) | INTRAMUSCULAR | Status: DC
  Administered 2021-03-15: 5 [IU] via SUBCUTANEOUS
  Administered 2021-03-16 (×2): 3 [IU] via SUBCUTANEOUS
  Administered 2021-03-16 – 2021-03-17 (×2): 2 [IU] via SUBCUTANEOUS

## 2021-03-14 MED ORDER — HYDROCORTISONE 5 MG PO TABS: 5.0000 mg | ORAL_TABLET | ORAL | Status: DC

## 2021-03-14 MED ORDER — POLYETHYLENE GLYCOL 3350 17 G PO PACK: 17.0000 g | PACK | Freq: Every day | ORAL | Status: DC | PRN

## 2021-03-14 MED ORDER — FENTANYL CITRATE (PF) 100 MCG/2ML IJ SOLN
100.0000 ug | INTRAMUSCULAR | Status: DC | PRN
  Administered 2021-03-14 (×2): 100 ug via INTRAVENOUS
  Filled 2021-03-14 (×2): qty 2

## 2021-03-14 MED ORDER — SODIUM ZIRCONIUM CYCLOSILICATE 10 G PO PACK
10.0000 g | PACK | Freq: Once | ORAL | Status: AC
  Administered 2021-03-14: 10 g via ORAL
  Filled 2021-03-14: qty 1

## 2021-03-14 MED ORDER — BUPROPION HCL ER (XL) 150 MG PO TB24
300.0000 mg | ORAL_TABLET | Freq: Every day | ORAL | Status: DC
  Administered 2021-03-16 – 2021-03-17 (×2): 300 mg via ORAL
  Filled 2021-03-14: qty 1
  Filled 2021-03-14 (×2): qty 2

## 2021-03-14 MED ORDER — PREGABALIN 75 MG PO CAPS
75.0000 mg | ORAL_CAPSULE | Freq: Two times a day (BID) | ORAL | Status: DC
  Administered 2021-03-15 – 2021-03-17 (×4): 75 mg via ORAL
  Filled 2021-03-14 (×4): qty 1

## 2021-03-14 MED ORDER — LEVOTHYROXINE SODIUM 25 MCG PO TABS
125.0000 ug | ORAL_TABLET | Freq: Every day | ORAL | Status: DC
  Administered 2021-03-16 – 2021-03-17 (×2): 125 ug via ORAL
  Filled 2021-03-14 (×2): qty 1

## 2021-03-14 MED ORDER — OXYCODONE-ACETAMINOPHEN 5-325 MG PO TABS
1.0000 | ORAL_TABLET | ORAL | Status: DC | PRN
  Filled 2021-03-14: qty 1

## 2021-03-14 MED ORDER — DULOXETINE HCL 60 MG PO CPEP
60.0000 mg | ORAL_CAPSULE | Freq: Two times a day (BID) | ORAL | Status: DC
  Administered 2021-03-15 – 2021-03-17 (×4): 60 mg via ORAL
  Filled 2021-03-14 (×4): qty 1

## 2021-03-14 MED ORDER — LACTATED RINGERS IV BOLUS
500.0000 mL | Freq: Once | INTRAVENOUS | Status: AC
  Administered 2021-03-14: 500 mL via INTRAVENOUS

## 2021-03-14 MED ORDER — METOPROLOL SUCCINATE ER 100 MG PO TB24
100.0000 mg | ORAL_TABLET | Freq: Every day | ORAL | Status: DC
  Administered 2021-03-15 – 2021-03-17 (×3): 100 mg via ORAL
  Filled 2021-03-14: qty 1
  Filled 2021-03-14: qty 4
  Filled 2021-03-14: qty 1

## 2021-03-14 MED ORDER — ACETAMINOPHEN 325 MG PO TABS: 650.0000 mg | ORAL_TABLET | ORAL | Status: DC | PRN

## 2021-03-14 MED ORDER — LACTATED RINGERS IV SOLN: INTRAVENOUS | Status: DC

## 2021-03-14 NOTE — H&P (Signed)
History and Physical    Brianna Porter [id number] DOB: 12-20-1960 DOA: 03/14/2021  PCP: Cher Nakai, MD  Patient coming from: Home   Chief Complaint:  Chief Complaint  Patient presents with   Hip Pain    Fall      HPI:    60 year old female with past medical history of hypothyroidism, paroxysmal atrial fibrillation, systolic and diastolic congestive heart failure (Echo 02/2020 EF 45-50% with G2DD), coronary artery disease (coronary CT 04/2019 with mild nonobstructive CAD), hypertension, chronic kidney disease stage IIIb, depression, generalized anxiety disorder and diabetes mellitus type 2 who presents to [facility] emergency department with complaints of right hip pain.  Patient explains that while walking outside of her home earlier in the day on 7/26 she was walking through a portion of her yard where there was gravel.  She was walking using her cane and attempting to stabilize her self on surrounding structures had a brief episode of mild lightheadedness causing her to stumble and fall.  She denies loss of consciousness.  She landed on her right hip resulting in immediate severe pain.  Patient describes this pain as sharp in quality, severe in intensity, radiating distally down the right leg and worse with any movement at all.  Patient states that she laid in the same place for approximately 30 minutes before anyone came to her assistance.  EMS was contacted and the patient was promptly brought into [facility] emergency department for evaluation.  Upon evaluation in the emergency department right hip x-ray did confirm a mildly displaced right femoral neck fracture.  ER provider discussed case with Dr. Percell Miller with orthopedic surgery who stated that they will see patient in consultation tomorrow morning at that patient may potentially go to the operating room in the morning considering patient last took Eliquis on the morning of 7/25.  Patient was administered 100 mcg  of intravenous fentanyl x2 doses for analgesia and the hospitalist group was then called to assess the patient for admission to the hospital.  Review of Systems:   Review of Systems  Musculoskeletal:  Positive for falls and joint pain.  All other systems reviewed and are negative.  Past Medical History:  Diagnosis Date   Acute gastric ulcer with hemorrhage    Altered mental status 05/07/2019   Anxiety and depression    Atrial flutter (HCC)    CAD (coronary artery disease)    Chronic systolic CHF (congestive heart failure) ([place])    a. dx in setting of atrial fib/flutter - possibly tachy mediated. Coronary CTA with only mild CAD in 04/2019.   CKD (chronic kidney disease) stage 3, GFR 30-59 ml/min (HCC) 04/23/2019   Confusion    a. persistent confusion during 04/2019 admission of unclear cause. Home meds adjusted. CT/MRI brain nonacute.   Depression 03/02/2019   Diabetes ([place])    Edema    Elevated liver function tests    Essential hypertension    Falls    Gastrointestinal hemorrhage    Hypercholesteremia    Hyperkalemia    Hyperlipidemia 04/23/2019   Hypertensive heart and chronic kidney disease with systolic congestive heart failure ([place])    Hypertensive heart disease 03/02/2019   Hyperthyroidism    Hypokalemia    Hypokalemia    Hypomagnesemia    Hyponatremia    Iron deficiency anemia    Mild CAD    a. Coronary CT 04/2019 - Minimal, Non-obstructive CAD.   NICM (nonischemic cardiomyopathy) ([place])    Osteoarthritis 03/02/2019   PAF (  paroxysmal atrial fibrillation) (HCC)    Pressure injury of skin 07/09/2019   Prolonged QT interval    Symptomatic anemia 06/28/2019    Past Surgical History:  Procedure Laterality Date   BIOPSY  06/29/2019   Procedure: BIOPSY;  Surgeon: Irene Shipper, MD;  Location: [place];  Service: Endoscopy;;   CARDIOVERSION N/A 04/29/2019   Procedure: CARDIOVERSION;  Surgeon: Sanda Klein, MD;  Location: [place] ENDOSCOPY;  Service: Cardiovascular;  Laterality:  N/A;   CARDIOVERSION N/A 05/04/2019   Procedure: CARDIOVERSION;  Surgeon: Josue Hector, MD;  Location: [facility] ENDOSCOPY;  Service: Cardiovascular;  Laterality: N/A;   COLONOSCOPY WITH PROPOFOL N/A 07/12/2019   Procedure: COLONOSCOPY WITH PROPOFOL;  Surgeon: Doran Stabler, MD;  Location: [place];  Service: Gastroenterology;  Laterality: N/A;   ENTEROSCOPY N/A 07/10/2019   Procedure: ENTEROSCOPY;  Surgeon: Doran Stabler, MD;  Location: [place];  Service: Gastroenterology;  Laterality: N/A;   ESOPHAGOGASTRODUODENOSCOPY  06/29/2019   ESOPHAGOGASTRODUODENOSCOPY (EGD) WITH PROPOFOL N/A 06/29/2019   Procedure: ESOPHAGOGASTRODUODENOSCOPY (EGD) WITH PROPOFOL;  Surgeon: Irene Shipper, MD;  Location: [facility] ENDOSCOPY;  Service: Endoscopy;  Laterality: N/A;   GIVENS CAPSULE STUDY N/A 07/12/2019   Procedure: GIVENS CAPSULE STUDY;  Surgeon: Doran Stabler, MD;  Location: [place];  Service: Gastroenterology;  Laterality: N/A;   HEMOSTASIS CLIP PLACEMENT  07/12/2019   Procedure: HEMOSTASIS CLIP PLACEMENT;  Surgeon: Doran Stabler, MD;  Location: [place];  Service: Gastroenterology;;   POLYPECTOMY  07/12/2019   Procedure: POLYPECTOMY;  Surgeon: Doran Stabler, MD;  Location: [place];  Service: Gastroenterology;;   SMALL BOWEL ENTEROSCOPY  07/10/2019   SUBMUCOSAL TATTOO INJECTION  07/10/2019   Procedure: SUBMUCOSAL TATTOO INJECTION;  Surgeon: Doran Stabler, MD;  Location: [place];  Service: Gastroenterology;;   TEE WITHOUT CARDIOVERSION  04/29/2019   TEE WITHOUT CARDIOVERSION N/A 04/29/2019   Procedure: TRANSESOPHAGEAL ECHOCARDIOGRAM (TEE);  Surgeon: Sanda Klein, MD;  Location: [facility] ENDOSCOPY;  Service: Cardiovascular;  Laterality: N/A;   TUBAL LIGATION       reports that she quit smoking about 21 years ago. Her smoking use included cigarettes. She has a 60.00 pack-year smoking history. She has never used smokeless tobacco. She reports that she does not drink  alcohol and does not use drugs.  Allergies  Allergen Reactions   Ambien [Zolpidem Tartrate]     Pt and family state this med makes the patient sleepwalk.   Mobic [Meloxicam] Nausea Only    GI pain   Adhesive [Tape]     Tears skin, please use paper tape   Codeine Nausea And Vomiting    Family History  Problem Relation Age of Onset   Cancer Mother    Heart disease Mother    Hypercholesterolemia Mother    Osteoarthritis Mother    Stroke Mother    Heart disease Father    Hypercholesterolemia Brother      Prior to Admission medications   Medication Sig Start Date End Date Taking? Authorizing Provider  acetaminophen (TYLENOL) 325 MG tablet Take 650 mg by mouth every 6 (six) hours as needed for mild pain.   Yes [provider]  ALPRAZolam (XANAX) 0.5 MG tablet Take 1 tablet (0.5 mg total) by mouth 3 (three) times daily as needed for anxiety or sleep. 03/18/20  Yes Shelly Coss, MD  amLODipine (NORVASC) 10 MG tablet Take 10 mg by mouth daily. 03/08/21  Yes [provider]  apixaban (ELIQUIS) 5 MG TABS tablet  Take 1 tablet (5 mg total) by mouth 2 (two) times daily. Patient taking differently: Take 5 mg by mouth daily. 10/12/19  Yes Camnitz, Ocie Doyne, MD  Ascorbic Acid (VITAMIN C) 1000 MG tablet Take 1,000 mg by mouth daily.   Yes [provider]  buPROPion (WELLBUTRIN XL) 300 MG 24 hr tablet Take 300 mg by mouth daily. 03/08/21  Yes [provider]  cetirizine (ZYRTEC) 10 MG tablet Take 10 mg by mouth daily as needed for allergies.   Yes [provider]  cholecalciferol (VITAMIN D3) 25 MCG (1000 UNIT) tablet Take 1,000 Units by mouth daily.   Yes [provider]  DULoxetine (CYMBALTA) 60 MG capsule Take 1 capsule (60 mg total) by mouth 2 (two) times daily. 03/18/20  Yes Shelly Coss, MD  fenofibrate (TRICOR) 48 MG tablet Take 48 mg by mouth daily.   Yes [provider]  ferrous sulfate 325 (65 FE) MG tablet Take 325 mg by  mouth daily.   Yes [provider]  hydrocortisone (CORTEF) 5 MG tablet Take 1 tablet (5 mg total) by mouth as directed. 3 tabs with Breakfast and 1 tab between 2-4 pm Patient taking differently: Take 5 mg by mouth See admin instructions. 3 tabs with Breakfast and 1 tab between 2-4 pm 04/15/20  Yes Shamleffer, Melanie Crazier, MD  irbesartan (AVAPRO) 150 MG tablet Take 1 tablet (150 mg total) by mouth daily. 03/19/20  Yes Shelly Coss, MD  levETIRAcetam (KEPPRA) 500 MG tablet Take 500 mg by mouth 2 (two) times daily. 03/02/21  Yes [provider]  levothyroxine (SYNTHROID) 125 MCG tablet Take 125 mcg by mouth daily. 04/12/20  Yes [provider]  Menthol, Topical Analgesic, (ICY HOT ADVANCED RELIEF EX) Apply 1 application topically 3 (three) times daily as needed (pain).    Yes [provider]  sulfamethoxazole-trimethoprim (BACTRIM DS) 800-160 MG tablet Take 1 tablet by mouth 2 (two) times daily. 03/09/21  Yes [provider]  furosemide (LASIX) 40 MG tablet Take 1 tablet by mouth twice daily Patient not taking: Reported on 03/14/2021 01/08/20   Richardo Priest, MD  meclizine (ANTIVERT) 12.5 MG tablet Take 1 tablet (12.5 mg total) by mouth 3 (three) times daily as needed for dizziness. Patient not taking: Reported on 03/14/2021 12/29/19   Antonieta Pert, MD  metoprolol succinate (TOPROL-XL) 100 MG 24 hr tablet Take 1 tablet (100 mg total) by mouth daily. Take with or immediately following a meal. 10/12/19   Camnitz, Ocie Doyne, MD  OVER THE COUNTER MEDICATION Apply 1 application topically daily as needed (pain).    [provider]  pantoprazole (PROTONIX) 40 MG tablet Take 40 mg by mouth 2 (two) times daily.  10/15/19   [provider]  polyethylene glycol (MIRALAX / GLYCOLAX) 17 g packet Take 17 g by mouth at bedtime.     [provider]  potassium chloride SA (KLOR-CON) 20 MEQ tablet Take 1 tablet (20 mEq total) by mouth 2 (two) times  daily. Patient taking differently: Take 20 mEq by mouth daily.  02/23/20   Dorie Rank, MD  pregabalin (LYRICA) 75 MG capsule Take 75 mg by mouth 2 (two) times daily. 04/29/20   [provider]  promethazine (PHENERGAN) 25 MG tablet Take 25 mg by mouth every 6 (six) hours as needed for nausea or vomiting.  02/12/20   [provider]  rosuvastatin (CRESTOR) 20 MG tablet Take 1 tablet (20 mg total) by mouth once a week. 06/29/20 09/27/20  Richardo Priest, MD  tetrahydrozoline-zinc (VISINE-AC) 0.05-0.25 % ophthalmic solution Place 2 drops into both eyes 3 (three) times daily as needed (dry eyes).    [provider]  traMADol (ULTRAM) 50 MG tablet Take 50 mg by mouth every 6 (six) hours as needed.    [provider]  Vitamin D, Ergocalciferol, (DRISDOL) 1.25 MG (50000 UNIT) CAPS capsule TAKE 1 CAPSULE BY MOUTH ONE TIME PER WEEK 12/28/20   Shamleffer, Melanie Crazier, MD    Physical Exam: Vitals:   03/14/21 1945 03/14/21 2019 03/14/21 2030 03/14/21 2106  BP: 94/75 139/63 (!) 121/92 108/74  Pulse: 66 68 69 71  Resp: '17 20 11 20  '$ Temp:  98.2 F (36.8 C)    TempSrc:  Oral    SpO2: 98% 96% 100% 100%  Weight:      Height:        Constitutional: Awake alert and oriented x3, patient is visibly anxious and in distress due to pain. Skin: no rashes, no lesions, numerous ecchymoses noted over the bilateral upper extremities. Eyes: Pupils are equally reactive to light.  No evidence of scleral icterus or conjunctival pallor.  ENMT: Moist mucous membranes noted.  Posterior pharynx clear of any exudate or lesions.   Neck: normal, supple, no masses, no thyromegaly.  No evidence of jugular venous distension.   Respiratory: clear to auscultation bilaterally, no wheezing, no crackles. Normal respiratory effort. No accessory muscle use.  Cardiovascular: Regular rate and rhythm, no murmurs / rubs / gallops. No extremity edema. 2+ pedal pulses. No carotid bruits.  Chest:   Nontender  without crepitus or deformity.   Back:   Nontender without crepitus or deformity. Abdomen: Abdomen is soft and nontender.  No evidence of intra-abdominal masses.  Positive bowel sounds noted in all quadrants.   Musculoskeletal: Right hip pain with both passive and active range of motion.  no contractures. Normal muscle tone.  Neurologic: CN 2-12 grossly intact. Sensation intact.  Strength of right lower extremity unable to be assessed due to severe right hip pain.  Patient is following all commands.  Patient is responsive to verbal stimuli.   Psychiatric: Patient is extremely anxious with labile affect.  Patient seems to possess insight as to their current situation.     Labs on Admission: I have personally reviewed following labs and imaging studies -   CBC: Recent Labs  Lab 03/14/21 1812  WBC 17.0*  NEUTROABS 13.7*  HGB 10.0*  HCT 33.9*  MCV 98.5  PLT 123456   Basic Metabolic Panel: Recent Labs  Lab 03/14/21 1812  NA 133*  K 5.7*  CL 107  CO2 17*  GLUCOSE 135*  BUN 26*  CREATININE 1.52*  CALCIUM 9.1   GFR: Estimated Creatinine Clearance: 35.5 mL/min (A) (by C-G formula based on SCr of 1.52 mg/dL (H)). Liver Function Tests: Recent Labs  Lab 03/14/21 1812  AST 23  ALT 23  ALKPHOS 74  BILITOT 0.2*  PROT 6.8  ALBUMIN 3.9   No results for input(s): LIPASE, AMYLASE in the last 168 hours. No results for input(s): AMMONIA in the last 168 hours. Coagulation Profile: No results for input(s): INR, PROTIME in the last 168 hours. Cardiac Enzymes: No results for input(s): CKTOTAL, CKMB, CKMBINDEX, TROPONINI in the last 168 hours. BNP (last 3 results) No results for input(s): PROBNP in the last 8760 hours. HbA1C: No results for input(s): HGBA1C in the last 72 hours. CBG: No results for input(s): GLUCAP in the last 168 hours. Lipid Profile: No  results for input(s): CHOL, HDL, LDLCALC, TRIG, CHOLHDL, LDLDIRECT in the last 72 hours. Thyroid Function Tests: No results for  input(s): TSH, T4TOTAL, FREET4, T3FREE, THYROIDAB in the last 72 hours. Anemia Panel: No results for input(s): VITAMINB12, FOLATE, FERRITIN, TIBC, IRON, RETICCTPCT in the last 72 hours. Urine analysis:    Component Value Date/Time   COLORURINE STRAW (A) 03/04/2020 2210   APPEARANCEUR CLEAR 03/04/2020 2210   LABSPEC 1.003 (L) 03/04/2020 2210   PHURINE 7.0 03/04/2020 2210   GLUCOSEU NEGATIVE 03/04/2020 2210   HGBUR NEGATIVE 03/04/2020 2210   BILIRUBINUR NEGATIVE 03/04/2020 2210   KETONESUR NEGATIVE 03/04/2020 2210   PROTEINUR NEGATIVE 03/04/2020 2210   NITRITE NEGATIVE 03/04/2020 2210   LEUKOCYTESUR NEGATIVE 03/04/2020 2210    Radiological Exams on Admission - Personally Reviewed: DG Chest 1 View  Result Date: 03/14/2021 CLINICAL DATA:  Status post fall. EXAM: CHEST  1 VIEW COMPARISON:  November 13, 2020 FINDINGS: Very mild, stable linear scarring and/or atelectasis is seen within the upper right lung. There is no evidence of acute infiltrate, pleural effusion or pneumothorax. The heart size and mediastinal contours are within normal limits. The visualized skeletal structures are unremarkable. IMPRESSION: Very mild right upper lobe linear scarring and/or atelectasis. Electronically Signed   By: Virgina Norfolk M.D.   On: 03/14/2021 19:51   DG Hip Unilat With Pelvis 2-3 Views Right  Result Date: 03/14/2021 CLINICAL DATA:  60 year old female with fall and right hip pain. EXAM: DG HIP (WITH OR WITHOUT PELVIS) 2-3V RIGHT COMPARISON:  None. FINDINGS: There is a comminuted mildly displaced fracture of the right femoral neck involving the intertrochanteric and subtrochanteric region. No dislocation. The bones are osteopenic. The soft tissues are grossly unremarkable. IMPRESSION: Mildly displaced comminuted fracture of the right femoral neck. No dislocation. Electronically Signed   By: Anner Crete M.D.   On: 03/14/2021 19:49    EKG: Personally reviewed.  Rhythm is normal sinus rhythm with heart  rate of 69 bpm.  No dynamic ST segment changes appreciated.  Assessment/Plan Principal Problem:   Closed right hip fracture (HCC)  Right femoral neck fracture status post what sounds to be a mechanical fall ER provider discussed case with Dr. Percell Miller with orthopedic surgery who will come evaluate patient in the morning in consultation Patient reports last dose of Eliquis was the morning of 7/25 N.p.o. for midnight in case of potential surgery As needed opiates etiologies for associated substantial pain  Active Problems:  Hyperkalemia  Potassium 5.7 on arrival Etiology not entirely clear however it may be due to a combination of mild volume depletion and ongoing oral potassium supplementation Holding any further oral potassium supplementation Holding home regimen of irbesartan Administering Lokelma Mild intravenous volume resuscitation Monitoring potassium levels with serial chemistries Monitoring patient on telemetry   Type 2 diabetes mellitus with stage 3b chronic kidney disease, without long-term current use of insulin ([place])  Accu-Cheks before every meal and nightly with sliding scale insulin Hemoglobin A1c pending    Chronic kidney disease, stage 3b (HCC)  Strict intake and output monitoring Creatinine near baseline Minimizing nephrotoxic agents as much as possible Serial chemistries to monitor renal function and electrolytes    Hypothyroidism  Continue home regimen of Synthroid    AF (paroxysmal atrial fibrillation) ([place])  Patient reports last dose of Eliquis was morning of 7/25 Continue home regimen of metoprolol Monitoring patient on telemetry Patient currently rate controlled and and in normal sinus rhythm    Chronic combined systolic (congestive) and diastolic (congestive) heart  failure (HCC)  No clinical evidence of cardiogenic volume overload at this time Monitoring for any evidence of volume overload with gentle intravenous hydration.    Generalized  anxiety disorder  Notably anxious on interview Will resume home regimen of as needed low-dose Xanax for bouts of anxiety    Leukocytosis  Likely stress-induced due to fall and fracture No clinical evidence of infection Chest x-ray reveals no definitive evidence of pneumonia Will obtain urinalysis   Code Status:  Full code Family Communication: deferred   Status is: Inpatient  Remains inpatient appropriate because:Ongoing diagnostic testing needed not appropriate for outpatient work up, IV treatments appropriate due to intensity of illness or inability to take PO, and Inpatient level of care appropriate due to severity of illness  Dispo: The patient is from: Home              Anticipated d/c is to: Home              Patient currently is not medically stable to d/c.   Difficult to place patient No        Vernelle Emerald MD [facility] Pager [phone]  If 7PM-7AM, please contact night-coverage [email or web address] Use universal [place] password for that web site. If you do not have the password, please call the hospital operator.  03/14/2021, 11:05 PM

## 2021-03-14 NOTE — ED Triage Notes (Addendum)
Pt BIB by EMS from home s/p fall with shortening/rotation ofRLE. Pt received 50 mcg Fentanyl X2 en route, now on 4L satting at 96%.  Pt takes eliquis, denies hitting head, denies LOC, reports dizziness prior to fall, denies CP or SOB.  Pt reports taking abx for an abscess L axilla.

## 2021-03-14 NOTE — ED Provider Notes (Signed)
[facility] EMERGENCY DEPARTMENT Provider Note   CSN: [id number] Arrival date & time: 03/14/21  1758     History Chief Complaint  Patient presents with   Hip Pain    Fall     Brianna Porter is a 60 y.o. female.  HPI Patient fell while getting out of a car.  She was trying to maneuver with her cane and was on a rocky surface, and accidentally fell, striking her right hip as she landed.  She was unable to ambulate because of severe right hip pain.  She presents by EMS after receiving 2 doses of fentanyl, 50 mg each.  On arrival she is alert cooperative able to give history.  She denies recent illnesses or other prodrome.  There are no other known active modifying factors.    Past Medical History:  Diagnosis Date   Acute gastric ulcer with hemorrhage    Altered mental status 05/07/2019   Anxiety and depression    Atrial flutter (HCC)    CAD (coronary artery disease)    Chronic systolic CHF (congestive heart failure) ([place])    a. dx in setting of atrial fib/flutter - possibly tachy mediated. Coronary CTA with only mild CAD in 04/2019.   CKD (chronic kidney disease) stage 3, GFR 30-59 ml/min (HCC) 04/23/2019   Confusion    a. persistent confusion during 04/2019 admission of unclear cause. Home meds adjusted. CT/MRI brain nonacute.   Depression 03/02/2019   Diabetes ([place])    Edema    Elevated liver function tests    Essential hypertension    Falls    Gastrointestinal hemorrhage    Hypercholesteremia    Hyperkalemia    Hyperlipidemia 04/23/2019   Hypertensive heart and chronic kidney disease with systolic congestive heart failure ([place])    Hypertensive heart disease 03/02/2019   Hyperthyroidism    Hypokalemia    Hypokalemia    Hypomagnesemia    Hyponatremia    Iron deficiency anemia    Mild CAD    a. Coronary CT 04/2019 - Minimal, Non-obstructive CAD.   NICM (nonischemic cardiomyopathy) ([place])    Osteoarthritis 03/02/2019   PAF (paroxysmal atrial fibrillation)  (HCC)    Pressure injury of skin 07/09/2019   Prolonged QT interval    Symptomatic anemia 06/28/2019    Patient Active Problem List   Diagnosis Date Noted   Closed right hip fracture ([place]) 03/14/2021   Prolonged QT interval    PAF (paroxysmal atrial fibrillation) (HCC)    Hypomagnesemia    Hypertensive heart and chronic kidney disease with systolic congestive heart failure ([place])    Hyperkalemia    Hypercholesteremia    Falls    Edema    Chronic systolic CHF (congestive heart failure) (HCC)    Atrial flutter ([place])    Anxiety and depression    Vitamin D deficiency 04/15/2020   Secondary adrenal insufficiency ([place]) 04/15/2020   Acquired hypothyroidism 04/15/2020   Convulsions ([place]) 03/06/2020   Encephalopathy 03/05/2020   Fall 12/23/2019   Gastrointestinal hemorrhage    Pressure injury of skin 07/09/2019   Acute gastric ulcer with hemorrhage    Symptomatic anemia 06/28/2019   Elevated liver function tests    NICM (nonischemic cardiomyopathy) (HCC)    Mild CAD    Confusion    Diabetes ([place])    Iron deficiency anemia    Hyperthyroidism    Altered mental status [id number]   Acute systolic heart failure ([place]) 05/07/2019   AKI (acute kidney injury) ([place]) 05/07/2019  Hyponatremia    CAD (coronary artery disease)    Essential hypertension    Hypokalemia    Prolonged Q-T interval on ECG 04/30/2019   Atrial fibrillation with rapid ventricular response ([place]) 04/29/2019   Persistent atrial fibrillation ([place])    Hyperlipidemia 04/23/2019   Anemia 04/23/2019   CKD (chronic kidney disease) stage 3, GFR 30-59 ml/min ([place]) 04/23/2019   Depression 03/02/2019   Osteoarthritis 03/02/2019   Type 2 diabetes mellitus with complication, without long-term current use of insulin ([place]) 03/02/2019   Hypertensive heart disease 03/02/2019    Past Surgical History:  Procedure Laterality Date   BIOPSY  06/29/2019   Procedure: BIOPSY;  Surgeon: Irene Shipper, MD;  Location: [place];   Service: Endoscopy;;   CARDIOVERSION N/A 04/29/2019   Procedure: CARDIOVERSION;  Surgeon: Sanda Klein, MD;  Location: [place];  Service: Cardiovascular;  Laterality: N/A;   CARDIOVERSION N/A 05/04/2019   Procedure: CARDIOVERSION;  Surgeon: Josue Hector, MD;  Location: [place];  Service: Cardiovascular;  Laterality: N/A;   COLONOSCOPY WITH PROPOFOL N/A 07/12/2019   Procedure: COLONOSCOPY WITH PROPOFOL;  Surgeon: Doran Stabler, MD;  Location: [place];  Service: Gastroenterology;  Laterality: N/A;   ENTEROSCOPY N/A 07/10/2019   Procedure: ENTEROSCOPY;  Surgeon: Doran Stabler, MD;  Location: [place];  Service: Gastroenterology;  Laterality: N/A;   ESOPHAGOGASTRODUODENOSCOPY  06/29/2019   ESOPHAGOGASTRODUODENOSCOPY (EGD) WITH PROPOFOL N/A 06/29/2019   Procedure: ESOPHAGOGASTRODUODENOSCOPY (EGD) WITH PROPOFOL;  Surgeon: Irene Shipper, MD;  Location: [facility] ENDOSCOPY;  Service: Endoscopy;  Laterality: N/A;   GIVENS CAPSULE STUDY N/A 07/12/2019   Procedure: GIVENS CAPSULE STUDY;  Surgeon: Doran Stabler, MD;  Location: [place];  Service: Gastroenterology;  Laterality: N/A;   HEMOSTASIS CLIP PLACEMENT  07/12/2019   Procedure: HEMOSTASIS CLIP PLACEMENT;  Surgeon: Doran Stabler, MD;  Location: [place];  Service: Gastroenterology;;   POLYPECTOMY  07/12/2019   Procedure: POLYPECTOMY;  Surgeon: Doran Stabler, MD;  Location: [place];  Service: Gastroenterology;;   SMALL BOWEL ENTEROSCOPY  07/10/2019   SUBMUCOSAL TATTOO INJECTION  07/10/2019   Procedure: SUBMUCOSAL TATTOO INJECTION;  Surgeon: Doran Stabler, MD;  Location: [place];  Service: Gastroenterology;;   TEE WITHOUT CARDIOVERSION  04/29/2019   TEE WITHOUT CARDIOVERSION N/A 04/29/2019   Procedure: TRANSESOPHAGEAL ECHOCARDIOGRAM (TEE);  Surgeon: Sanda Klein, MD;  Location: [facility] ENDOSCOPY;  Service: Cardiovascular;  Laterality: N/A;   TUBAL LIGATION       OB History   No obstetric  history on file.     Family History  Problem Relation Age of Onset   Cancer Mother    Heart disease Mother    Hypercholesterolemia Mother    Osteoarthritis Mother    Stroke Mother    Heart disease Father    Hypercholesterolemia Brother     Social History   Tobacco Use   Smoking status: Former    Packs/day: 2.00    Years: 30.00    Pack years: 60.00    Types: Cigarettes    Quit date: 2001    Years since quitting: 21.5   Smokeless tobacco: Never  Vaping Use   Vaping Use: Never used  Substance Use Topics   Alcohol use: Never   Drug use: Never    Home Medications Prior to Admission medications   Medication Sig Start Date End Date Taking? Authorizing Provider  acetaminophen (TYLENOL) 325 MG tablet Take 650 mg by mouth every 6 (six) hours as needed for  mild pain.    [provider]  albuterol (VENTOLIN HFA) 108 (90 Base) MCG/ACT inhaler Inhale 2 puffs into the lungs every 6 (six) hours as needed for wheezing or shortness of breath.    [provider]  ALPRAZolam Duanne Moron) 0.5 MG tablet Take 1 tablet (0.5 mg total) by mouth 3 (three) times daily as needed for anxiety or sleep. 03/18/20   Shelly Coss, MD  amLODipine (NORVASC) 10 MG tablet Take 10 mg by mouth daily. 03/08/21   [provider]  apixaban (ELIQUIS) 5 MG TABS tablet Take 1 tablet (5 mg total) by mouth 2 (two) times daily. Patient taking differently: Take 5 mg by mouth daily.  10/12/19   Camnitz, Ocie Doyne, MD  Ascorbic Acid (VITAMIN C) 1000 MG tablet Take 1,000 mg by mouth daily.    [provider]  buPROPion (WELLBUTRIN XL) 300 MG 24 hr tablet Take 300 mg by mouth daily. 03/08/21   [provider]  cetirizine (ZYRTEC) 10 MG tablet Take 10 mg by mouth daily as needed for allergies.    [provider]  cholecalciferol (VITAMIN D3) 25 MCG (1000 UNIT) tablet Take 1,000 Units by mouth daily.    [provider]  ciprofloxacin (CIPRO) 250 MG tablet Take 250 mg  by mouth 2 (two) times daily. 04/11/20   [provider]  doxycycline (VIBRA-TABS) 100 MG tablet Take 100 mg by mouth 2 (two) times daily. 02/05/20   [provider]  DULoxetine (CYMBALTA) 60 MG capsule Take 1 capsule (60 mg total) by mouth 2 (two) times daily. 03/18/20   Shelly Coss, MD  fenofibrate (TRICOR) 48 MG tablet Take 48 mg by mouth at bedtime.    [provider]  ferrous sulfate 325 (65 FE) MG tablet Take 325 mg by mouth every other day.     [provider]  furosemide (LASIX) 40 MG tablet Take 1 tablet by mouth twice daily Patient taking differently: Take 40 mg by mouth daily.  01/08/20   Richardo Priest, MD  gabapentin (NEURONTIN) 400 MG capsule Take 400 mg by mouth 3 (three) times daily.    [provider]  hydrocortisone (CORTEF) 5 MG tablet Take 1 tablet (5 mg total) by mouth as directed. 3 tabs with Breakfast and 1 tab between 2-4 pm 04/15/20   Shamleffer, Melanie Crazier, MD  irbesartan (AVAPRO) 150 MG tablet Take 1 tablet (150 mg total) by mouth daily. 03/19/20   Shelly Coss, MD  levETIRAcetam (KEPPRA) 500 MG tablet Take 500 mg by mouth 2 (two) times daily. 03/02/21   [provider]  levothyroxine (SYNTHROID) 125 MCG tablet Take 125 mcg by mouth daily. 04/12/20   [provider]  meclizine (ANTIVERT) 12.5 MG tablet Take 1 tablet (12.5 mg total) by mouth 3 (three) times daily as needed for dizziness. 12/29/19   Antonieta Pert, MD  Menthol, Topical Analgesic, (ICY HOT ADVANCED RELIEF EX) Apply 1 application topically 3 (three) times daily as needed (pain).     [provider]  metoprolol succinate (TOPROL-XL) 100 MG 24 hr tablet Take 1 tablet (100 mg total) by mouth daily. Take with or immediately following a meal. 10/12/19   Camnitz, Ocie Doyne, MD  OVER THE COUNTER MEDICATION Apply 1 application topically daily as needed (pain).    [provider]  pantoprazole (PROTONIX) 40 MG tablet Take 40 mg by mouth 2  (two) times daily.  10/15/19   [provider]  polyethylene glycol (MIRALAX / GLYCOLAX) 17 g packet  Take 17 g by mouth at bedtime.     [provider]  potassium chloride SA (KLOR-CON) 20 MEQ tablet Take 1 tablet (20 mEq total) by mouth 2 (two) times daily. Patient taking differently: Take 20 mEq by mouth daily.  02/23/20   Dorie Rank, MD  pregabalin (LYRICA) 75 MG capsule Take 75 mg by mouth 2 (two) times daily. 04/29/20   [provider]  promethazine (PHENERGAN) 25 MG tablet Take 25 mg by mouth every 6 (six) hours as needed for nausea or vomiting.  02/12/20   [provider]  rosuvastatin (CRESTOR) 20 MG tablet Take 1 tablet (20 mg total) by mouth once a week. 06/29/20 09/27/20  Richardo Priest, MD  sulfamethoxazole-trimethoprim (BACTRIM DS) 800-160 MG tablet Take 1 tablet by mouth 2 (two) times daily. 03/09/21   [provider]  tetrahydrozoline-zinc (VISINE-AC) 0.05-0.25 % ophthalmic solution Place 2 drops into both eyes 3 (three) times daily as needed (dry eyes).    [provider]  traMADol (ULTRAM) 50 MG tablet Take 50 mg by mouth every 6 (six) hours as needed.    [provider]  Vitamin D, Ergocalciferol, (DRISDOL) 1.25 MG (50000 UNIT) CAPS capsule TAKE 1 CAPSULE BY MOUTH ONE TIME PER WEEK 12/28/20   Shamleffer, Melanie Crazier, MD    Allergies    Ambien [zolpidem tartrate], Mobic [meloxicam], Adhesive [tape], and Codeine  Review of Systems   Review of Systems  All other systems reviewed and are negative.  Physical Exam Updated Vital Signs BP 108/74   Pulse 71   Temp 98.2 F (36.8 C) (Oral)   Resp 20   Ht '5\' 2"'$  (1.575 m)   Wt 66.2 kg   LMP  (LMP Unknown) Comment: ?10 years ago?  SpO2 100%   BMI 26.70 kg/m   Physical Exam Vitals and nursing note reviewed.  Constitutional:      Appearance: She is well-developed.  HENT:     Head: Normocephalic and atraumatic.     Right Ear: External ear normal.     Left Ear:  External ear normal.     Mouth/Throat:     Mouth: Mucous membranes are moist.  Eyes:     Conjunctiva/sclera: Conjunctivae normal.     Pupils: Pupils are equal, round, and reactive to light.  Neck:     Trachea: Phonation normal.  Cardiovascular:     Rate and Rhythm: Normal rate and regular rhythm.  Pulmonary:     Effort: Pulmonary effort is normal.     Breath sounds: Normal breath sounds.  Chest:     Chest wall: No tenderness.  Abdominal:     General: There is no distension.     Palpations: Abdomen is soft.  Musculoskeletal:     Cervical back: Normal range of motion and neck supple.     Comments: Right leg shortening.  Right hip tender.  No gross deformity.  No other large joint deformity appreciated.  Skin:    General: Skin is warm and dry.  Neurological:     Mental Status: She is alert and oriented to person, place, and time.     Motor: No abnormal muscle tone.     Comments: No dysarthria or aphasia  Psychiatric:        Mood and Affect: Mood normal.        Behavior: Behavior normal.        Thought Content: Thought content normal.        Judgment: Judgment normal.  ED Results / Procedures / Treatments   Labs (all labs ordered are listed, but only abnormal results are displayed) Labs Reviewed  COMPREHENSIVE METABOLIC PANEL - Abnormal; Notable for the following components:      Result Value   Sodium 133 (*)    Potassium 5.7 (*)    CO2 17 (*)    Glucose, Bld 135 (*)    BUN 26 (*)    Creatinine, Ser 1.52 (*)    Total Bilirubin 0.2 (*)    GFR, Estimated 39 (*)    All other components within normal limits  CBC WITH DIFFERENTIAL/PLATELET - Abnormal; Notable for the following components:   WBC 17.0 (*)    RBC 3.44 (*)    Hemoglobin 10.0 (*)    HCT 33.9 (*)    MCHC 29.5 (*)    Neutro Abs 13.7 (*)    Abs Immature Granulocytes 0.17 (*)    All other components within normal limits  RESP PANEL BY RT-PCR (FLU A&B, COVID) ARPGX2  POTASSIUM  TYPE AND SCREEN     EKG EKG Interpretation  Date/Time:  Tuesday March 14 2021 20:17:37 EDT Ventricular Rate:  69 PR Interval:  49 QRS Duration: 111 QT Interval:  412 QTC Calculation: 439 R Axis:   86 Text Interpretation: Sinus rhythm Short PR interval Abnrm T, consider ischemia, anterolateral lds since last tracing no significant change Confirmed by Daleen Bo [id number]) on 03/14/2021 9:41:31 PM  Radiology DG Chest 1 View  Result Date: 03/14/2021 CLINICAL DATA:  Status post fall. EXAM: CHEST  1 VIEW COMPARISON:  November 13, 2020 FINDINGS: Very mild, stable linear scarring and/or atelectasis is seen within the upper right lung. There is no evidence of acute infiltrate, pleural effusion or pneumothorax. The heart size and mediastinal contours are within normal limits. The visualized skeletal structures are unremarkable. IMPRESSION: Very mild right upper lobe linear scarring and/or atelectasis. Electronically Signed   By: Virgina Norfolk M.D.   On: 03/14/2021 19:51   DG Hip Unilat With Pelvis 2-3 Views Right  Result Date: 03/14/2021 CLINICAL DATA:  60 year old female with fall and right hip pain. EXAM: DG HIP (WITH OR WITHOUT PELVIS) 2-3V RIGHT COMPARISON:  None. FINDINGS: There is a comminuted mildly displaced fracture of the right femoral neck involving the intertrochanteric and subtrochanteric region. No dislocation. The bones are osteopenic. The soft tissues are grossly unremarkable. IMPRESSION: Mildly displaced comminuted fracture of the right femoral neck. No dislocation. Electronically Signed   By: Anner Crete M.D.   On: 03/14/2021 19:49    Procedures .Critical Care  Date/Time: 03/14/2021 10:36 PM Performed by: Daleen Bo, MD Authorized by: Daleen Bo, MD   Critical care provider statement:    Critical care time (minutes):  35   Critical care start time:  03/14/2021 6:10 PM   Critical care end time:  03/14/2021 10:36 PM   Critical care time was exclusive of:  Separately billable  procedures and treating other patients   Critical care was necessary to treat or prevent imminent or life-threatening deterioration of the following conditions:  Trauma   Critical care was time spent personally by me on the following activities:  Blood draw for specimens, development of treatment plan with patient or surrogate, discussions with consultants, evaluation of patient's response to treatment, examination of patient, obtaining history from patient or surrogate, ordering and performing treatments and interventions, ordering and review of laboratory studies, pulse oximetry, re-evaluation of patient's condition, review of old charts and ordering and review of radiographic studies  Medications Ordered in ED Medications  0.9 %  sodium chloride infusion ( Intravenous Stopped 03/14/21 2004)  fentaNYL (SUBLIMAZE) injection 100 mcg (100 mcg Intravenous Given 03/14/21 2016)    ED Course  I have reviewed the triage vital signs and the nursing notes.  Pertinent labs & imaging results that were available during my care of the patient were reviewed by me and considered in my medical decision making (see chart for details).  Clinical Course as of 03/14/21 2236  Tue Mar 14, 2021  2129 Is discussed with Dr. Percell Miller, orthopedist who will see the patient as a Optometrist.  He states make the patient n.p.o. after midnight [EW]    Clinical Course User Index [EW] Daleen Bo, MD   MDM Rules/Calculators/A&P                            Patient Vitals for the past 24 hrs:  BP Temp Temp src Pulse Resp SpO2 Height Weight  03/14/21 2106 108/74 -- -- 71 20 100 % -- --  03/14/21 2030 (!) 121/92 -- -- 69 11 100 % -- --  03/14/21 2019 139/63 98.2 F (36.8 C) Oral 68 20 96 % -- --  03/14/21 1945 94/75 -- -- 66 17 98 % -- --  03/14/21 1900 (!) 126/59 -- -- 65 -- 99 % -- --  03/14/21 1815 122/72 -- -- 61 -- 98 % -- --  03/14/21 1814 -- -- -- -- -- -- '5\' 2"'$  (1.575 m) 66.2 kg  03/14/21 1813 (!) 125/51 (!)  97.5 F (36.4 C) Oral 62 16 100 % -- --  03/14/21 1805 -- -- -- -- -- 96 % -- --    9:53 PM Reevaluation with update and discussion. After initial assessment and treatment, an updated evaluation reveals she is comfortable and working on urinating at this time.  5 discussed with patient all questions were answered. Daleen Bo   Medical Decision Making:  This patient is presenting for evaluation of right hip pain after fall, which does require a range of treatment options, and is a complaint that involves a high risk of morbidity and mortality. The differential diagnoses include fracture, contusion, dislocation. I decided to review old records, and in summary [place] female, anticoagulated for atrial fibrillation(Eliquis), with numerous medical problems including diabetes, hypertension, non-ischemic cardiomyopathy, acute kidney disease and gait disorder..  I did not require additional historical information from anyone.  Clinical Laboratory Tests Ordered, included  hip fracture orderset . Review indicates normal except sodium low, potassium high, CO2 low, glucose high, BUN high, creatinine high, white count high, hemoglobin low.  Repeat potassium, ordered. Radiologic Tests Ordered, included chest x-ray, right hip, pelvis.  I independently Visualized: Radiographic images, which show right hip surgical neck fracture   Critical Interventions-clinical evaluation, laboratory testing, medication treatment, radiography, observation reassessment  After These Interventions, the Patient was reevaluated and was found with hip fracture requiring surgical repair.  She is on Eliquis so will have to be watched till that washes out.  Potassium elevated, initial screening, repeat ordered.  Likely hemolysis.  She has chronic mild renal insufficiency.  She also has some elevation of BUN with low sodium and CO2.  White count elevated nonspecific, hemoglobin is low.  Screening chest x-ray does not indicate infiltrate  or pneumonia  CRITICAL CARE-yes Performed by: Daleen Bo  Nursing Notes Reviewed/ Care Coordinated Applicable Imaging Reviewed Interpretation of Laboratory Data incorporated into ED treatment   Case discussed  with orthopedics during consultation.  Case discussed with hospitalist to arrange admission.    Final Clinical Impression(s) / ED Diagnoses Final diagnoses:  Closed fracture of right hip, initial encounter [facility])    Rx / [place] Orders ED Discharge Orders     None        Daleen Bo, MD 03/14/21 2237

## 2021-03-15 ENCOUNTER — Inpatient Hospital Stay (HOSPITAL_COMMUNITY): Payer: HMO | Admitting: Certified Registered Nurse Anesthetist

## 2021-03-15 ENCOUNTER — Encounter (HOSPITAL_COMMUNITY)
Admission: EM | Disposition: A | Discharge status: Home Health | Payer: Self-pay | Source: Home / Self Care | Attending: Internal Medicine

## 2021-03-15 ENCOUNTER — Inpatient Hospital Stay (HOSPITAL_COMMUNITY): Payer: HMO

## 2021-03-15 ENCOUNTER — Encounter (HOSPITAL_COMMUNITY): Payer: Self-pay | Admitting: Student

## 2021-03-15 DIAGNOSIS — I480 Paroxysmal atrial fibrillation: Secondary | ICD-10-CM | POA: Diagnosis not present

## 2021-03-15 DIAGNOSIS — D72829 Elevated white blood cell count, unspecified: Secondary | ICD-10-CM | POA: Diagnosis not present

## 2021-03-15 DIAGNOSIS — I5042 Chronic combined systolic (congestive) and diastolic (congestive) heart failure: Secondary | ICD-10-CM | POA: Diagnosis not present

## 2021-03-15 DIAGNOSIS — S72001A Fracture of unspecified part of neck of right femur, initial encounter for closed fracture: Secondary | ICD-10-CM | POA: Diagnosis not present

## 2021-03-15 HISTORY — PX: INTRAMEDULLARY (IM) NAIL INTERTROCHANTERIC: SHX5875

## 2021-03-15 LAB — CBC WITH DIFFERENTIAL/PLATELET
Abs Immature Granulocytes: 0.08 10*3/uL — ABNORMAL HIGH (ref 0.00–0.07)
Basophils Absolute: 0 10*3/uL (ref 0.0–0.1)
Basophils Relative: 1 %
Eosinophils Absolute: 0.1 10*3/uL (ref 0.0–0.5)
Eosinophils Relative: 2 %
HCT: 29.2 % — ABNORMAL LOW (ref 36.0–46.0)
Hemoglobin: 9 g/dL — ABNORMAL LOW (ref 12.0–15.0)
Immature Granulocytes: 1 %
Lymphocytes Relative: 18 %
Lymphs Abs: 1.6 10*3/uL (ref 0.7–4.0)
MCH: 29.3 pg (ref 26.0–34.0)
MCHC: 30.8 g/dL (ref 30.0–36.0)
MCV: 95.1 fL (ref 80.0–100.0)
Monocytes Absolute: 0.6 10*3/uL (ref 0.1–1.0)
Monocytes Relative: 7 %
Neutro Abs: 6.3 10*3/uL (ref 1.7–7.7)
Neutrophils Relative %: 71 %
Platelets: 292 10*3/uL (ref 150–400)
RBC: 3.07 MIL/uL — ABNORMAL LOW (ref 3.87–5.11)
RDW: 13.5 % (ref 11.5–15.5)
WBC: 8.7 10*3/uL (ref 4.0–10.5)
nRBC: 0 % (ref 0.0–0.2)

## 2021-03-15 LAB — I-STAT ARTERIAL BLOOD GAS, ED
Acid-base deficit: 5 mmol/L — ABNORMAL HIGH (ref 0.0–2.0)
Bicarbonate: 18.8 mmol/L — ABNORMAL LOW (ref 20.0–28.0)
Calcium, Ion: 1.28 mmol/L (ref 1.15–1.40)
HCT: 23 % — ABNORMAL LOW (ref 36.0–46.0)
Hemoglobin: 7.8 g/dL — ABNORMAL LOW (ref 12.0–15.0)
O2 Saturation: 96 %
Patient temperature: 98.6
Potassium: 5.3 mmol/L — ABNORMAL HIGH (ref 3.5–5.1)
Sodium: 133 mmol/L — ABNORMAL LOW (ref 135–145)
TCO2: 20 mmol/L — ABNORMAL LOW (ref 22–32)
pCO2 arterial: 29.5 mmHg — ABNORMAL LOW (ref 32.0–48.0)
pH, Arterial: 7.412 (ref 7.350–7.450)
pO2, Arterial: 80 mmHg — ABNORMAL LOW (ref 83.0–108.0)

## 2021-03-15 LAB — URINALYSIS, ROUTINE W REFLEX MICROSCOPIC
Bilirubin Urine: NEGATIVE
Glucose, UA: NEGATIVE mg/dL
Hgb urine dipstick: NEGATIVE
Ketones, ur: NEGATIVE mg/dL
Leukocytes,Ua: NEGATIVE
Nitrite: NEGATIVE
Protein, ur: NEGATIVE mg/dL
Specific Gravity, Urine: 1.009 (ref 1.005–1.030)
pH: 6 (ref 5.0–8.0)

## 2021-03-15 LAB — BASIC METABOLIC PANEL
Anion gap: 8 (ref 5–15)
BUN: 18 mg/dL (ref 6–20)
CO2: 21 mmol/L — ABNORMAL LOW (ref 22–32)
Calcium: 8.8 mg/dL — ABNORMAL LOW (ref 8.9–10.3)
Chloride: 104 mmol/L (ref 98–111)
Creatinine, Ser: 1.41 mg/dL — ABNORMAL HIGH (ref 0.44–1.00)
GFR, Estimated: 43 mL/min — ABNORMAL LOW (ref >60)
Glucose, Bld: 270 mg/dL — ABNORMAL HIGH (ref 70–99)
Potassium: 5.4 mmol/L — ABNORMAL HIGH (ref 3.5–5.1)
Sodium: 133 mmol/L — ABNORMAL LOW (ref 135–145)

## 2021-03-15 LAB — PROTIME-INR
INR: 1.2 (ref 0.8–1.2)
Prothrombin Time: 15.1 seconds (ref 11.4–15.2)

## 2021-03-15 LAB — COMPREHENSIVE METABOLIC PANEL
ALT: 15 U/L (ref 0–44)
AST: 16 U/L (ref 15–41)
Albumin: 2.6 g/dL — ABNORMAL LOW (ref 3.5–5.0)
Alkaline Phosphatase: 50 U/L (ref 38–126)
Anion gap: 11 (ref 5–15)
BUN: 18 mg/dL (ref 6–20)
CO2: 14 mmol/L — ABNORMAL LOW (ref 22–32)
Calcium: 8.2 mg/dL — ABNORMAL LOW (ref 8.9–10.3)
Chloride: 105 mmol/L (ref 98–111)
Creatinine, Ser: 1.05 mg/dL — ABNORMAL HIGH (ref 0.44–1.00)
GFR, Estimated: >60 mL/min (ref >60)
Glucose, Bld: 121 mg/dL — ABNORMAL HIGH (ref 70–99)
Potassium: 5.2 mmol/L — ABNORMAL HIGH (ref 3.5–5.1)
Sodium: 130 mmol/L — ABNORMAL LOW (ref 135–145)
Total Bilirubin: 0.1 mg/dL — ABNORMAL LOW (ref 0.3–1.2)
Total Protein: 4.5 g/dL — ABNORMAL LOW (ref 6.5–8.1)

## 2021-03-15 LAB — GLUCOSE, CAPILLARY
Glucose-Capillary: 156 mg/dL — ABNORMAL HIGH (ref 70–99)
Glucose-Capillary: 220 mg/dL — ABNORMAL HIGH (ref 70–99)
Glucose-Capillary: 210 mg/dL — ABNORMAL HIGH (ref 70–99)

## 2021-03-15 LAB — CBG MONITORING, ED: Glucose-Capillary: 119 mg/dL — ABNORMAL HIGH (ref 70–99)

## 2021-03-15 LAB — VITAMIN D 25 HYDROXY (VIT D DEFICIENCY, FRACTURES): Vit D, 25-Hydroxy: 25.03 ng/mL — ABNORMAL LOW (ref 30–100)

## 2021-03-15 LAB — SURGICAL PCR SCREEN
MRSA, PCR: NEGATIVE
Staphylococcus aureus: NEGATIVE

## 2021-03-15 LAB — HIV ANTIBODY (ROUTINE TESTING W REFLEX): HIV Screen 4th Generation wRfx: NONREACTIVE

## 2021-03-15 LAB — APTT: aPTT: 35 seconds (ref 24–36)

## 2021-03-15 IMAGING — RF DG FEMUR 2+V*R*
1 series · 8 of 8 positions shown · non-contrast
Comparison: Radiographs of the right hip [DATE].

CLINICAL DATA: Surgery, elective [YD] ([YD]-CM). Additional
history provided by technologist: ORIF of right hip fracture.
Provided fluoroscopy time 2 minutes, 4 seconds (14.52 mGy).

EXAM:
RIGHT FEMUR 2 VIEWS; DG C-ARM 1-60 MIN

[Series 1: run · 8 of 8 slices shown]
[im 1/8]
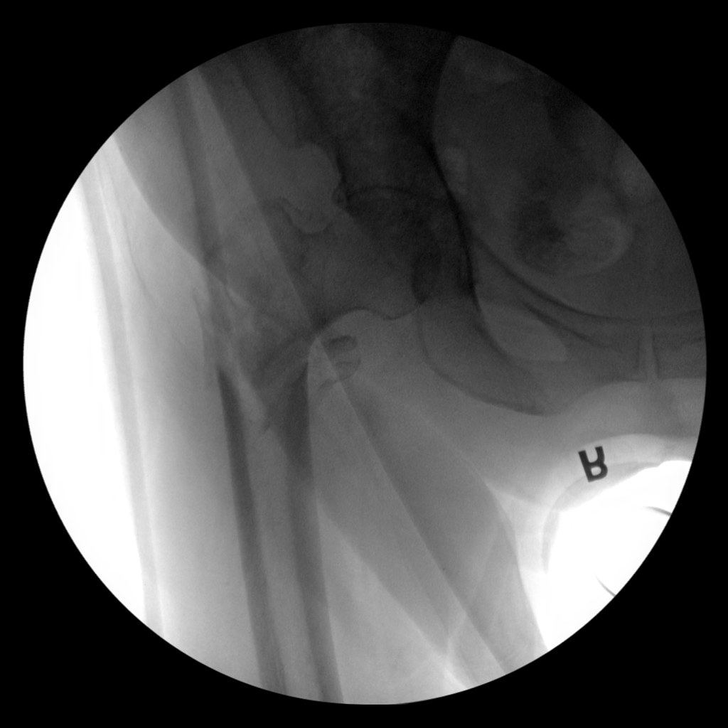
[im 2/8]
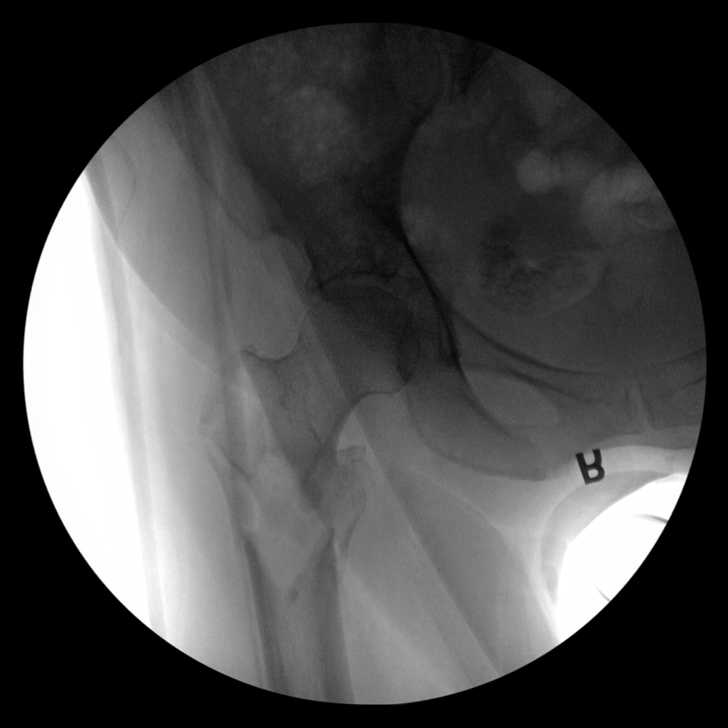
[im 3/8]
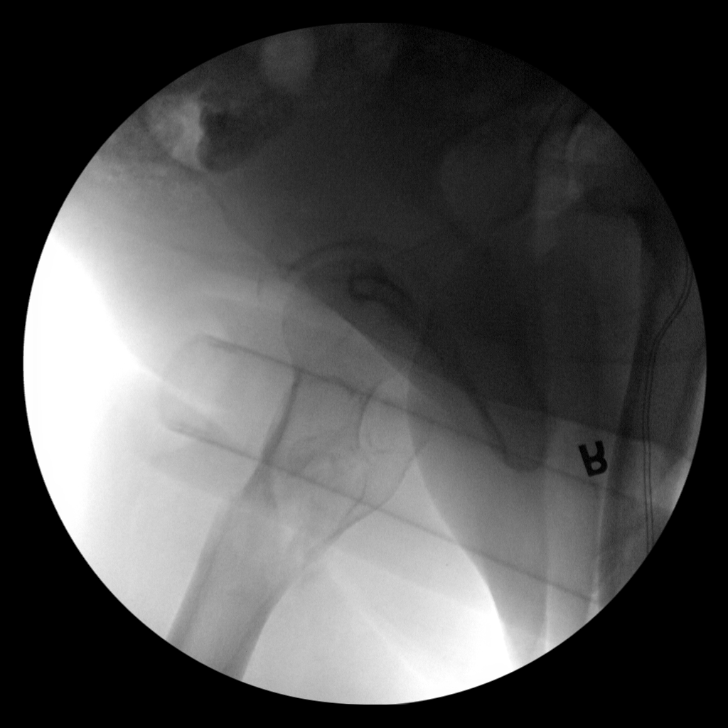
[im 4/8]
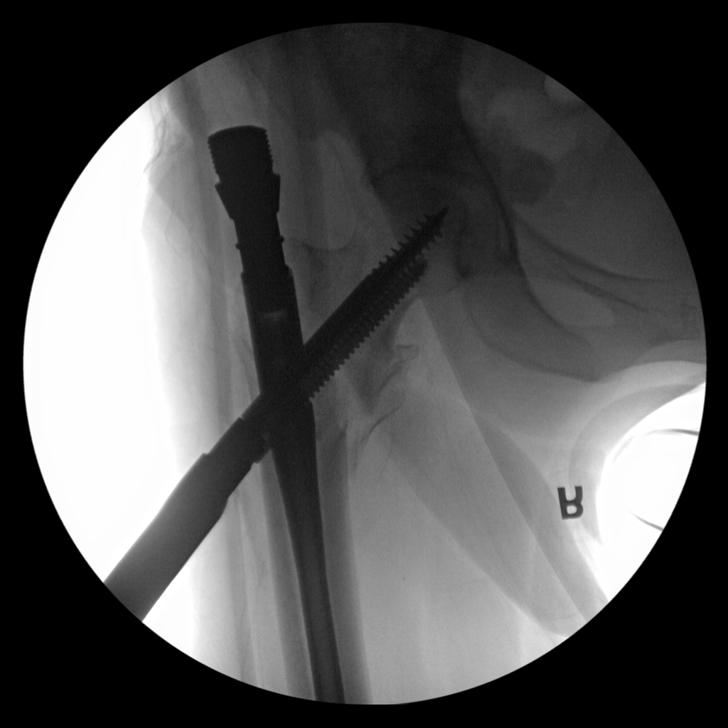
[im 5/8]
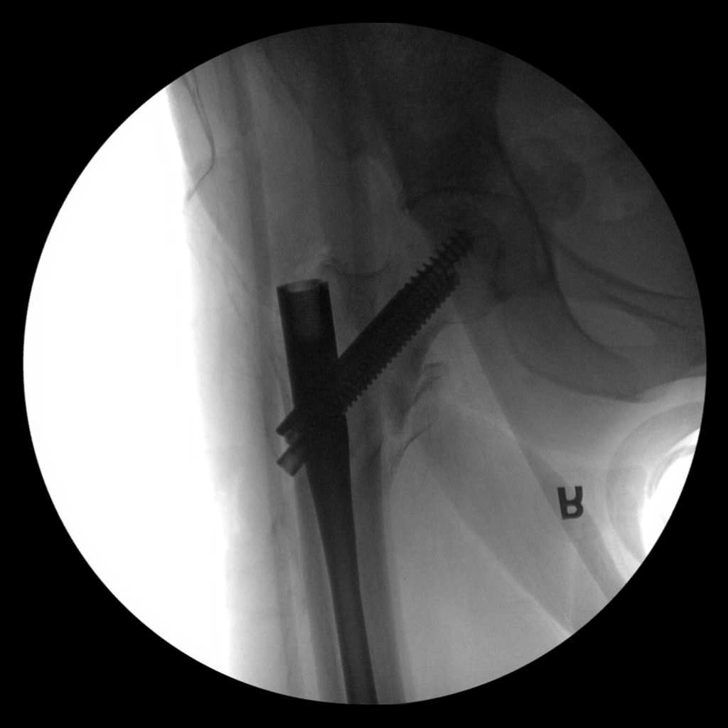
[im 6/8]
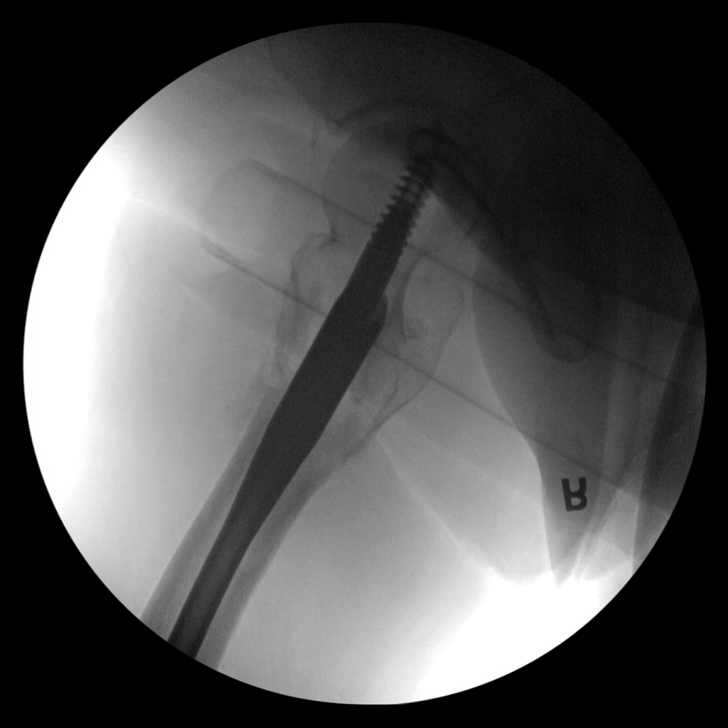
[im 7/8]
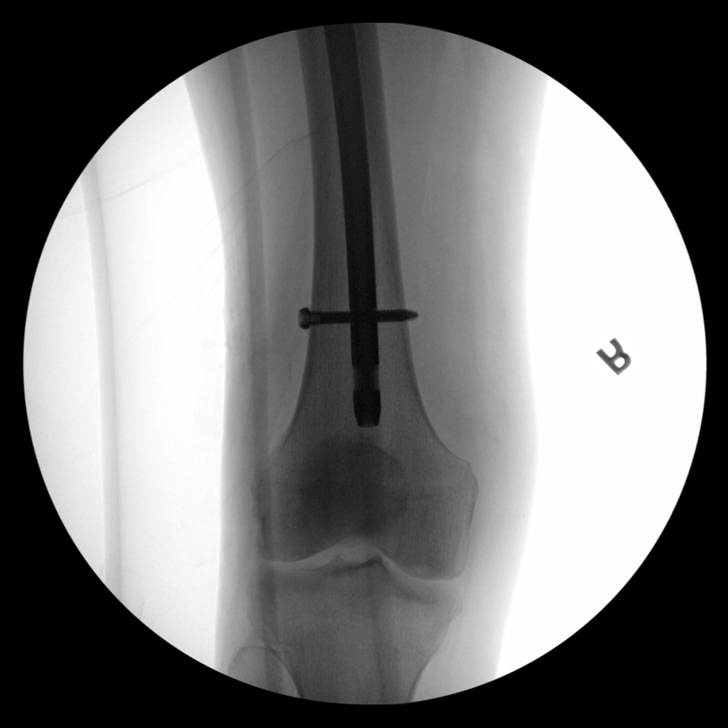
[im 8/8]
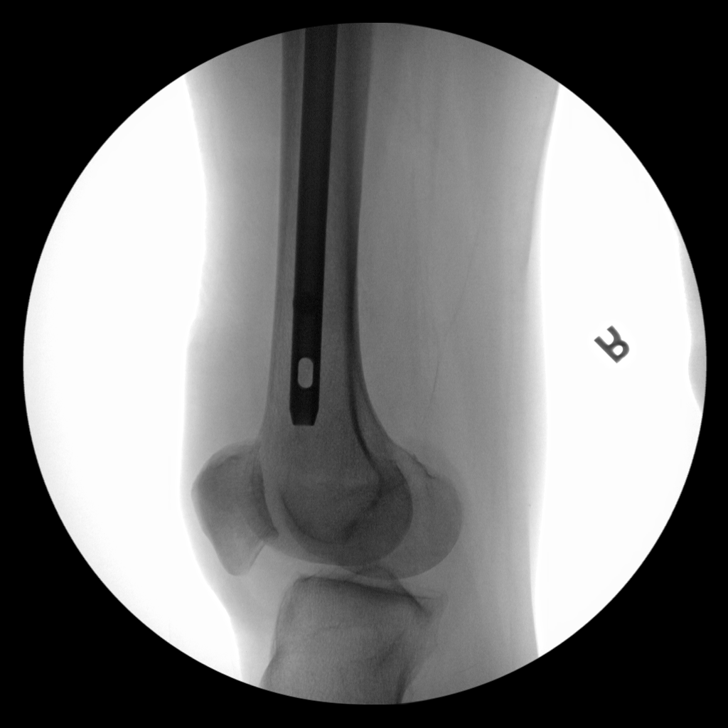

[8 of 8 positions shown; findings below may reference images not displayed]

FINDINGS: Eight intraoperative fluoroscopic images of the right hip/femur are
submitted. On the provided images, there are findings of interval
placement of an intramedullary nail within the right femur. There
are 2 proximal interlocking screws traversing the femoral head and
neck. A single distal interlocking screw is also noted. Hardware
traverses a known comminuted proximal right femur fracture.
Persistent although improved displacement at the fracture sites.
IMPRESSION: Eight intraoperative fluoroscopic images from right femoral
intramedullary nail placement for a proximal right femoral fracture,
as described.

## 2021-03-15 IMAGING — DX DG FEMUR 2+V PORT*R*
4 series · 4 of 4 positions shown · non-contrast
Comparison: Intraoperative radiographs [DATE]. Right hip
radiographs [DATE].

CLINICAL DATA: Right femur/hip repair for fracture.

EXAM:
RIGHT FEMUR PORTABLE 2 VIEW

[femur ap proximal (1 of 2)]
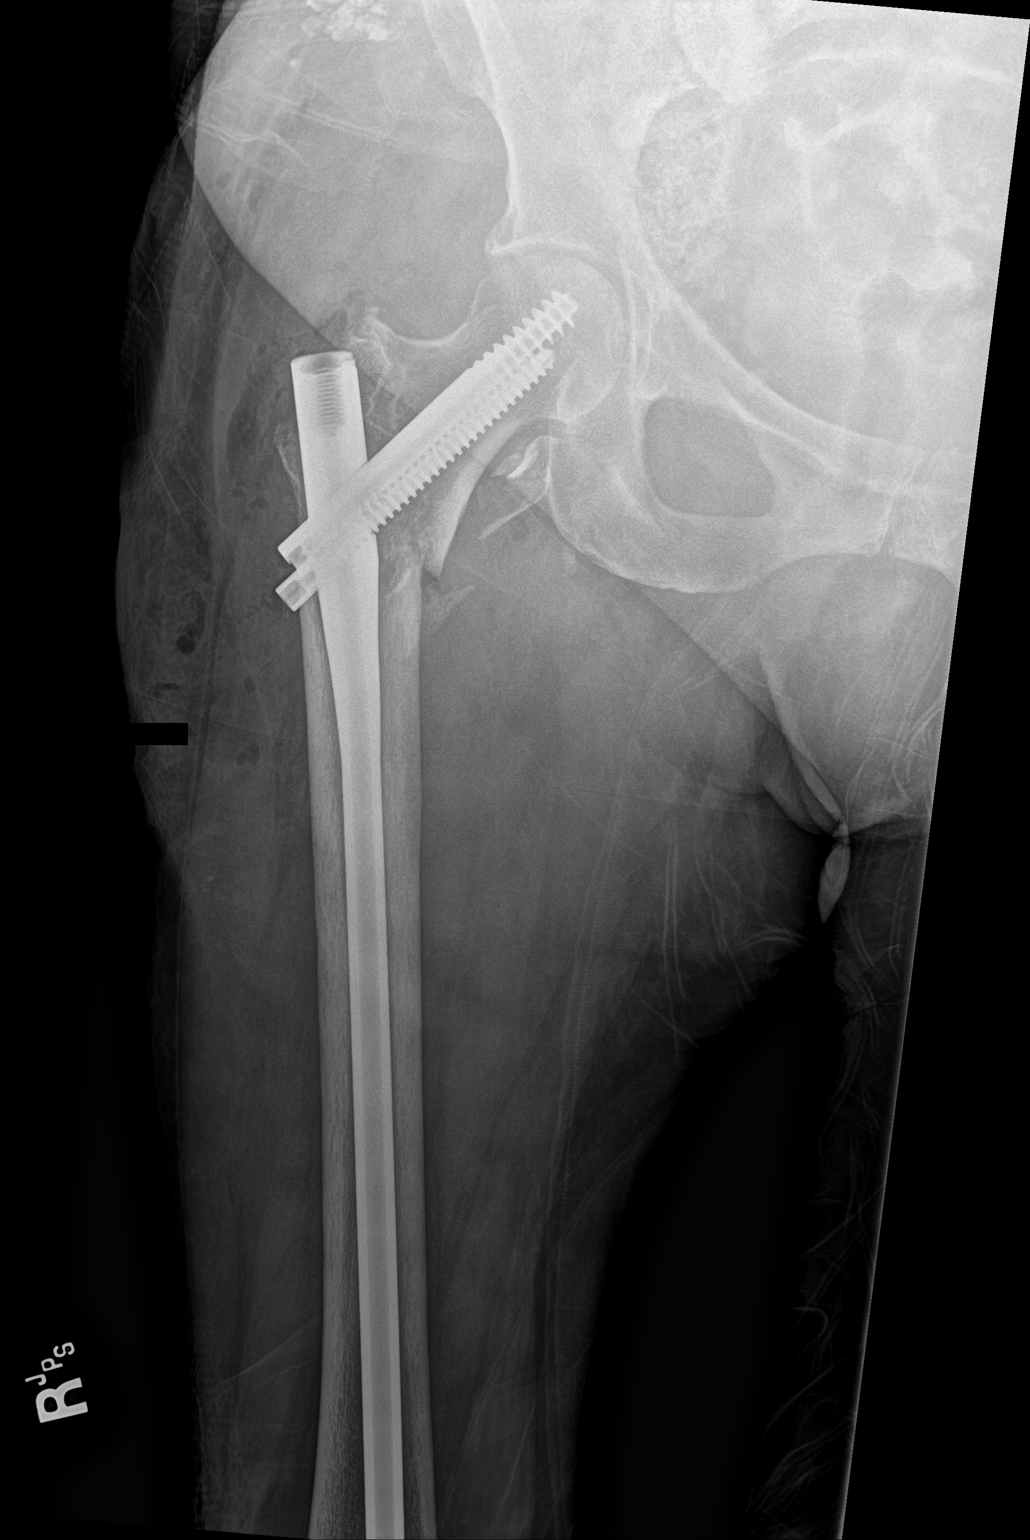

[femur ap proximal (2 of 2)]
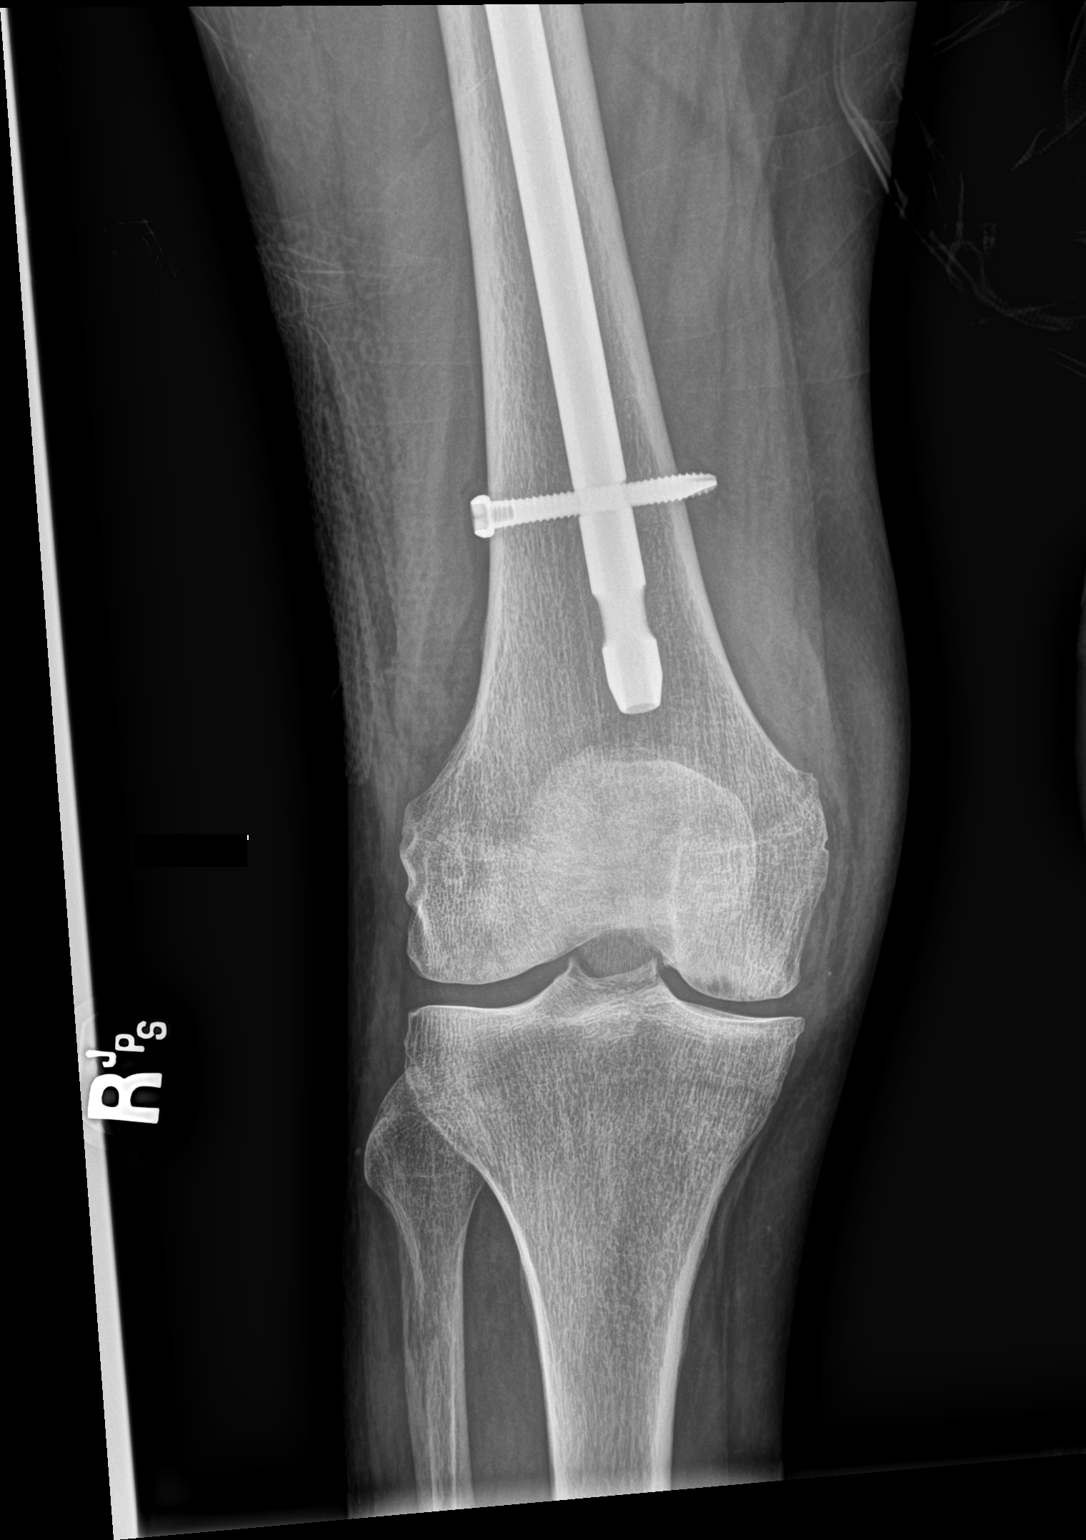

[femur lat]
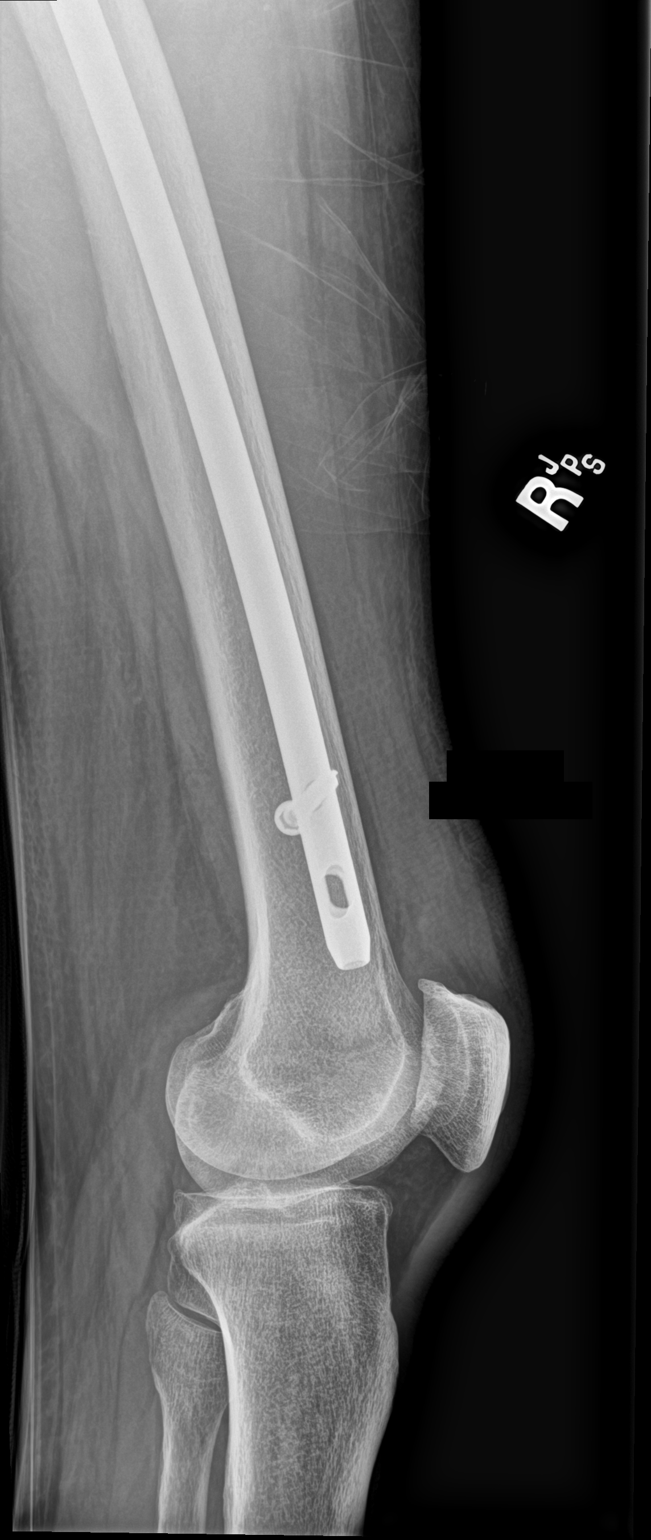

[hip lat]
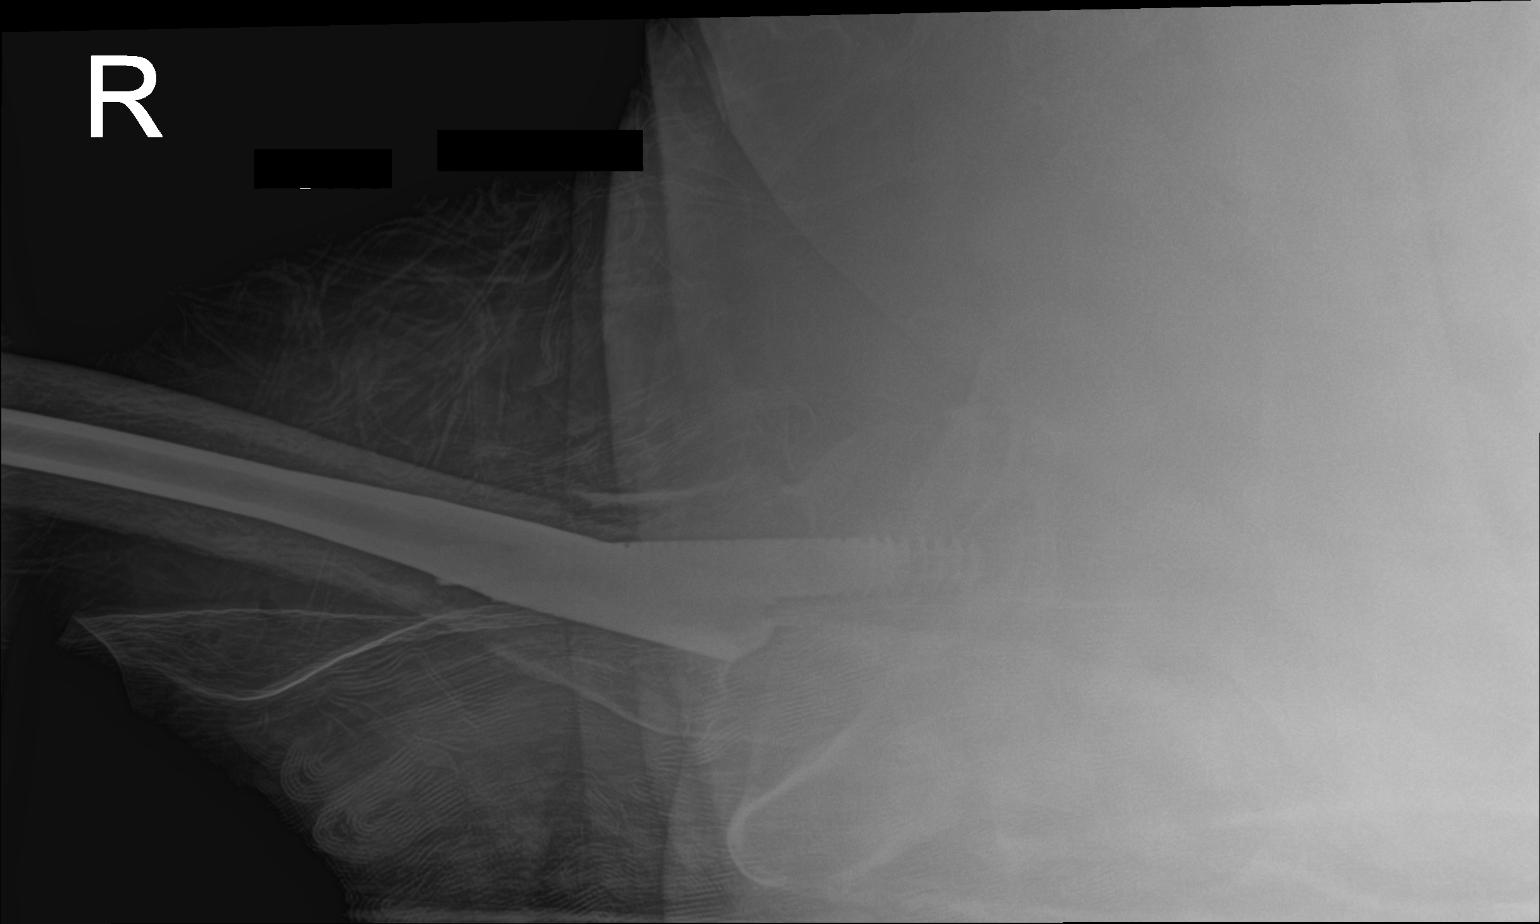

[4 of 4 positions shown; findings below may reference images not displayed]

FINDINGS: Intramedullary rod present in the femur with a single distal
interlocking screw. Two proximal dynamic hip screws extend through
the femoral neck. Fracture is reduced. Fracture fragments noted
medial to the hip.
IMPRESSION: ORIF of the right femur without radiographic evidence for
complication.

## 2021-03-15 IMAGING — RF DG C-ARM 1-60 MIN
1 series · 8 of 8 positions shown · non-contrast
Comparison: Radiographs of the right hip [DATE].

CLINICAL DATA: Surgery, elective [YD] ([YD]-CM). Additional
history provided by technologist: ORIF of right hip fracture.
Provided fluoroscopy time 2 minutes, 4 seconds (14.52 mGy).

EXAM:
RIGHT FEMUR 2 VIEWS; DG C-ARM 1-60 MIN

[Series 1: run · 8 of 8 slices shown]
[im 1/8]
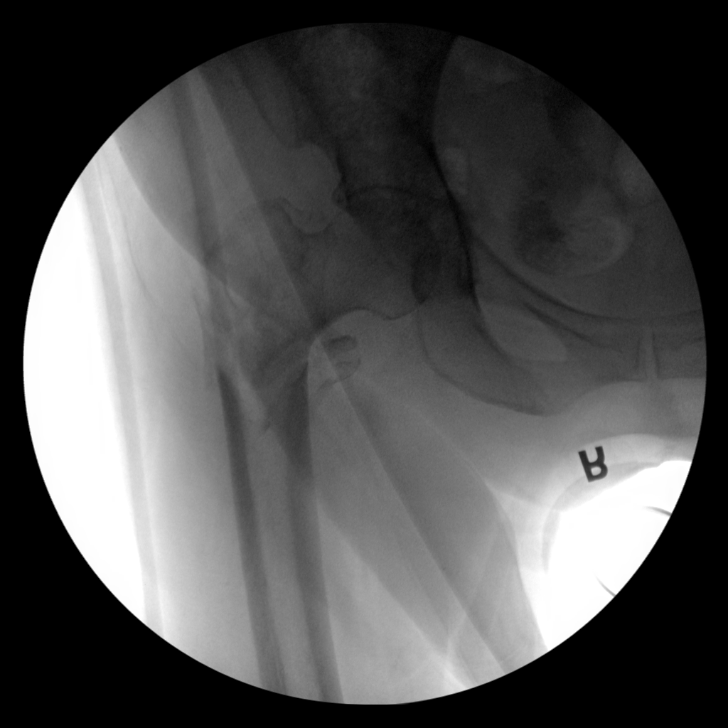
[im 2/8]
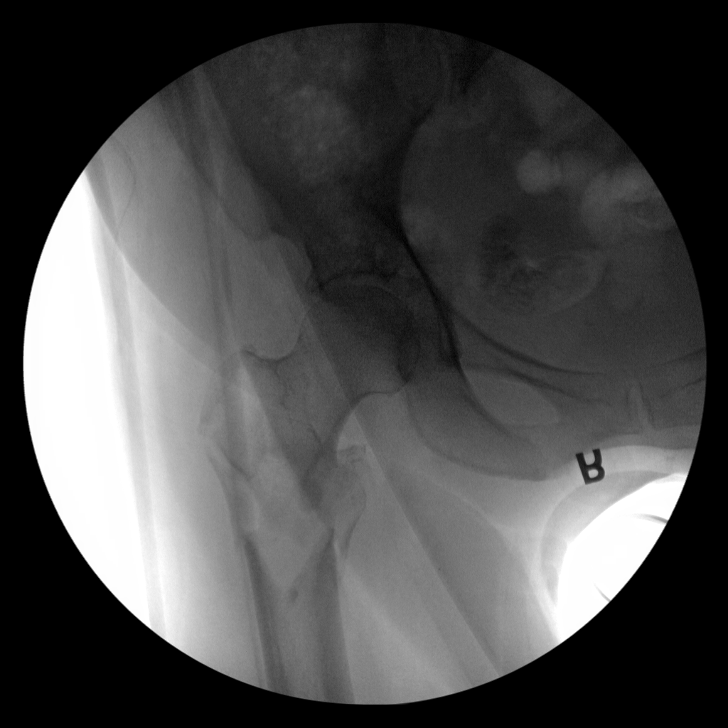
[im 3/8]
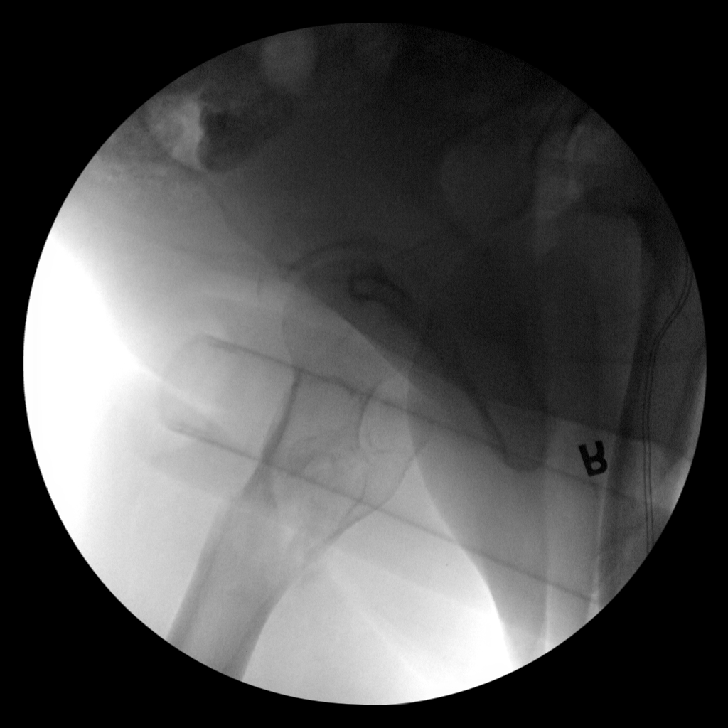
[im 4/8]
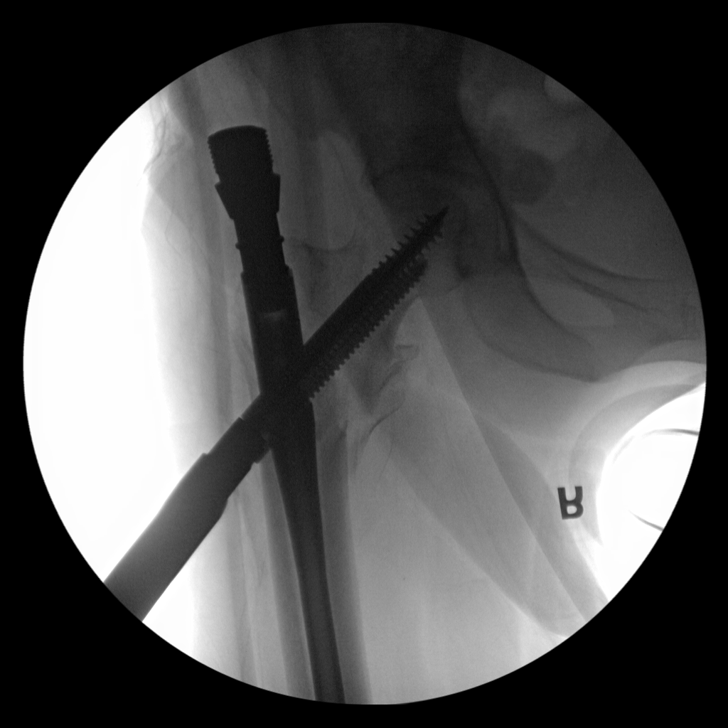
[im 5/8]
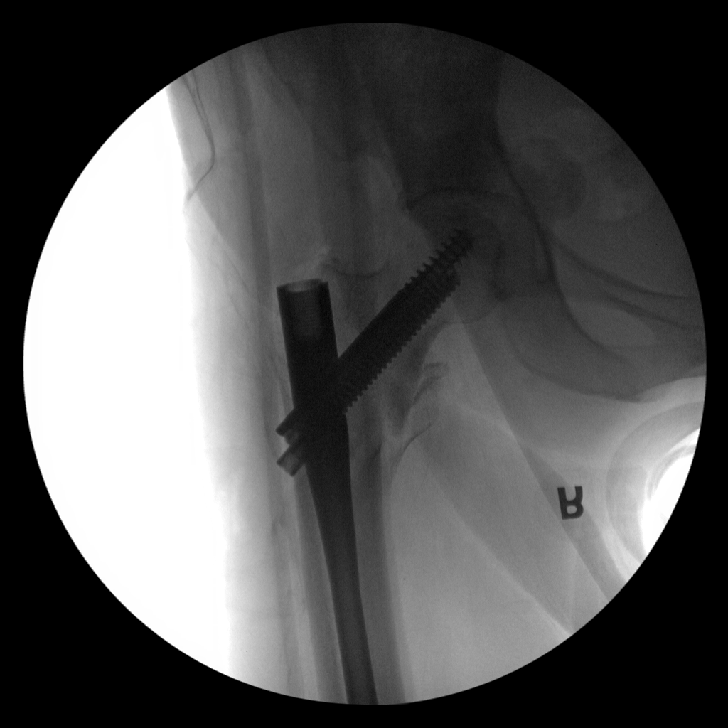
[im 6/8]
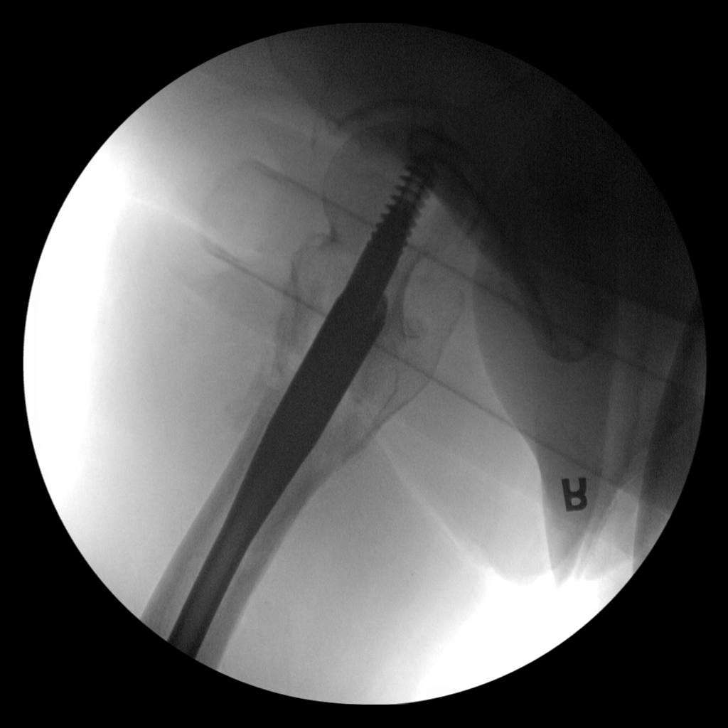
[im 7/8]
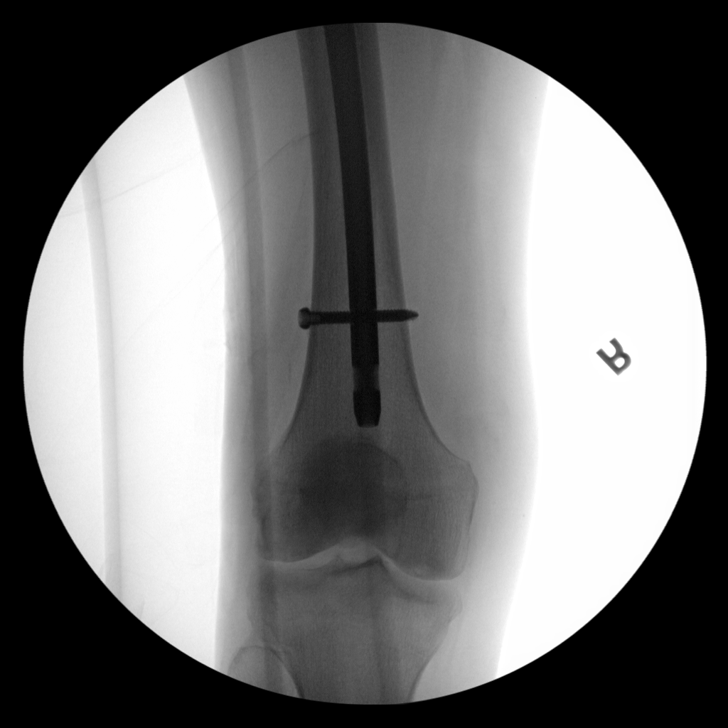
[im 8/8]
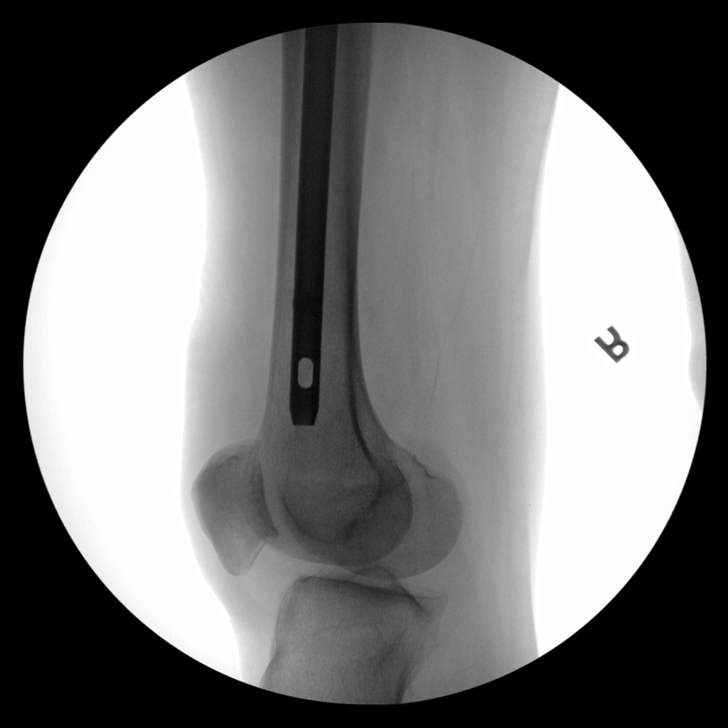

[8 of 8 positions shown; findings below may reference images not displayed]

FINDINGS: Eight intraoperative fluoroscopic images of the right hip/femur are
submitted. On the provided images, there are findings of interval
placement of an intramedullary nail within the right femur. There
are 2 proximal interlocking screws traversing the femoral head and
neck. A single distal interlocking screw is also noted. Hardware
traverses a known comminuted proximal right femur fracture.
Persistent although improved displacement at the fracture sites.
IMPRESSION: Eight intraoperative fluoroscopic images from right femoral
intramedullary nail placement for a proximal right femoral fracture,
as described.

## 2021-03-15 SURGERY — FIXATION, FRACTURE, INTERTROCHANTERIC, WITH INTRAMEDULLARY ROD
Anesthesia: General | Laterality: Right

## 2021-03-15 MED ORDER — PROPOFOL 10 MG/ML IV BOLUS
INTRAVENOUS | Status: DC | PRN
  Administered 2021-03-15: 150 mg via INTRAVENOUS

## 2021-03-15 MED ORDER — ONDANSETRON HCL 4 MG/2ML IJ SOLN: 4.0000 mg | Freq: Four times a day (QID) | INTRAMUSCULAR | Status: DC | PRN

## 2021-03-15 MED ORDER — SODIUM CHLORIDE 0.45 % IV SOLN: INTRAVENOUS | Status: DC

## 2021-03-15 MED ORDER — HYDROCORTISONE 5 MG PO TABS
15.0000 mg | ORAL_TABLET | Freq: Every day | ORAL | Status: DC
  Administered 2021-03-16 – 2021-03-17 (×2): 15 mg via ORAL
  Filled 2021-03-15 (×4): qty 1

## 2021-03-15 MED ORDER — METOCLOPRAMIDE HCL 5 MG/ML IJ SOLN: 5.0000 mg | Freq: Three times a day (TID) | INTRAMUSCULAR | Status: DC | PRN

## 2021-03-15 MED ORDER — DEXAMETHASONE SODIUM PHOSPHATE 10 MG/ML IJ SOLN
INTRAMUSCULAR | Status: AC
  Filled 2021-03-15: qty 1

## 2021-03-15 MED ORDER — VANCOMYCIN HCL 1000 MG IV SOLR
INTRAVENOUS | Status: AC
  Filled 2021-03-15: qty 1000

## 2021-03-15 MED ORDER — FENTANYL CITRATE (PF) 100 MCG/2ML IJ SOLN: 25.0000 ug | INTRAMUSCULAR | Status: DC | PRN

## 2021-03-15 MED ORDER — TRAMADOL HCL 50 MG PO TABS
50.0000 mg | ORAL_TABLET | Freq: Four times a day (QID) | ORAL | Status: DC | PRN
  Administered 2021-03-16 – 2021-03-17 (×3): 50 mg via ORAL
  Filled 2021-03-15 (×3): qty 1

## 2021-03-15 MED ORDER — LIDOCAINE 2% (20 MG/ML) 5 ML SYRINGE
INTRAMUSCULAR | Status: AC
  Filled 2021-03-15: qty 5

## 2021-03-15 MED ORDER — LACTATED RINGERS IV SOLN: INTRAVENOUS | Status: DC

## 2021-03-15 MED ORDER — PHENYLEPHRINE 40 MCG/ML (10ML) SYRINGE FOR IV PUSH (FOR BLOOD PRESSURE SUPPORT)
PREFILLED_SYRINGE | INTRAVENOUS | Status: AC
  Filled 2021-03-15: qty 10

## 2021-03-15 MED ORDER — ACETAMINOPHEN 325 MG PO TABS
650.0000 mg | ORAL_TABLET | Freq: Four times a day (QID) | ORAL | Status: DC
  Administered 2021-03-15 – 2021-03-17 (×9): 650 mg via ORAL
  Filled 2021-03-15 (×9): qty 2

## 2021-03-15 MED ORDER — PHENYLEPHRINE 40 MCG/ML (10ML) SYRINGE FOR IV PUSH (FOR BLOOD PRESSURE SUPPORT)
PREFILLED_SYRINGE | INTRAVENOUS | Status: DC | PRN
  Administered 2021-03-15 (×2): 80 ug via INTRAVENOUS
  Administered 2021-03-15: 40 ug via INTRAVENOUS
  Administered 2021-03-15: 80 ug via INTRAVENOUS

## 2021-03-15 MED ORDER — ORAL CARE MOUTH RINSE: 15.0000 mL | Freq: Once | OROMUCOSAL | Status: AC

## 2021-03-15 MED ORDER — METOCLOPRAMIDE HCL 5 MG PO TABS: 5.0000 mg | ORAL_TABLET | Freq: Three times a day (TID) | ORAL | Status: DC | PRN

## 2021-03-15 MED ORDER — DEXMEDETOMIDINE (PRECEDEX) IN NS 20 MCG/5ML (4 MCG/ML) IV SYRINGE
PREFILLED_SYRINGE | INTRAVENOUS | Status: DC | PRN
  Administered 2021-03-15: 4 ug via INTRAVENOUS
  Administered 2021-03-15: 8 ug via INTRAVENOUS

## 2021-03-15 MED ORDER — HYDROCORTISONE 5 MG PO TABS
5.0000 mg | ORAL_TABLET | Freq: Every day | ORAL | Status: DC
  Administered 2021-03-15 – 2021-03-17 (×3): 5 mg via ORAL
  Filled 2021-03-15 (×4): qty 1

## 2021-03-15 MED ORDER — ROCURONIUM BROMIDE 10 MG/ML (PF) SYRINGE
PREFILLED_SYRINGE | INTRAVENOUS | Status: DC | PRN
Start: 2021-03-15 — End: 2021-03-15
  Administered 2021-03-15: 50 mg via INTRAVENOUS

## 2021-03-15 MED ORDER — PHENYLEPHRINE HCL-NACL 10-0.9 MG/250ML-% IV SOLN
INTRAVENOUS | Status: DC | PRN
  Administered 2021-03-15: 40 ug/min via INTRAVENOUS

## 2021-03-15 MED ORDER — LACTATED RINGERS IV SOLN: INTRAVENOUS | Status: DC | PRN

## 2021-03-15 MED ORDER — KETOROLAC TROMETHAMINE 30 MG/ML IJ SOLN
30.0000 mg | Freq: Four times a day (QID) | INTRAMUSCULAR | Status: DC | PRN
  Administered 2021-03-15 – 2021-03-17 (×5): 30 mg via INTRAVENOUS
  Filled 2021-03-15 (×5): qty 1

## 2021-03-15 MED ORDER — METHOCARBAMOL 500 MG PO TABS
500.0000 mg | ORAL_TABLET | Freq: Four times a day (QID) | ORAL | Status: DC | PRN
  Administered 2021-03-15 – 2021-03-17 (×5): 500 mg via ORAL
  Filled 2021-03-15 (×6): qty 1

## 2021-03-15 MED ORDER — TRANEXAMIC ACID-NACL 1000-0.7 MG/100ML-% IV SOLN
1000.0000 mg | Freq: Once | INTRAVENOUS | Status: AC
  Administered 2021-03-15: 1000 mg via INTRAVENOUS
  Filled 2021-03-15: qty 100

## 2021-03-15 MED ORDER — ROCURONIUM BROMIDE 10 MG/ML (PF) SYRINGE
PREFILLED_SYRINGE | INTRAVENOUS | Status: AC
  Filled 2021-03-15: qty 10

## 2021-03-15 MED ORDER — SODIUM BICARBONATE 650 MG PO TABS
650.0000 mg | ORAL_TABLET | Freq: Three times a day (TID) | ORAL | Status: AC
  Administered 2021-03-15 (×3): 650 mg via ORAL
  Filled 2021-03-15 (×3): qty 1

## 2021-03-15 MED ORDER — METHOCARBAMOL 1000 MG/10ML IJ SOLN
500.0000 mg | Freq: Four times a day (QID) | INTRAVENOUS | Status: DC | PRN
  Filled 2021-03-15: qty 5

## 2021-03-15 MED ORDER — DEXAMETHASONE SODIUM PHOSPHATE 10 MG/ML IJ SOLN
INTRAMUSCULAR | Status: DC | PRN
  Administered 2021-03-15: 8 mg via INTRAVENOUS

## 2021-03-15 MED ORDER — SUGAMMADEX SODIUM 200 MG/2ML IV SOLN
INTRAVENOUS | Status: DC | PRN
  Administered 2021-03-15: 200 mg via INTRAVENOUS

## 2021-03-15 MED ORDER — CEFAZOLIN SODIUM-DEXTROSE 2-4 GM/100ML-% IV SOLN
2.0000 g | Freq: Three times a day (TID) | INTRAVENOUS | Status: AC
  Administered 2021-03-15 – 2021-03-16 (×3): 2 g via INTRAVENOUS
  Filled 2021-03-15 (×3): qty 100

## 2021-03-15 MED ORDER — MIDAZOLAM HCL 2 MG/2ML IJ SOLN
INTRAMUSCULAR | Status: DC | PRN
  Administered 2021-03-15: 2 mg via INTRAVENOUS

## 2021-03-15 MED ORDER — ONDANSETRON HCL 4 MG PO TABS: 4.0000 mg | ORAL_TABLET | Freq: Four times a day (QID) | ORAL | Status: DC | PRN

## 2021-03-15 MED ORDER — FENTANYL CITRATE (PF) 250 MCG/5ML IJ SOLN
INTRAMUSCULAR | Status: AC
  Filled 2021-03-15: qty 5

## 2021-03-15 MED ORDER — ONDANSETRON HCL 4 MG/2ML IJ SOLN
INTRAMUSCULAR | Status: DC | PRN
  Administered 2021-03-15: 4 mg via INTRAVENOUS

## 2021-03-15 MED ORDER — CEFAZOLIN SODIUM-DEXTROSE 2-4 GM/100ML-% IV SOLN
2.0000 g | INTRAVENOUS | Status: AC
  Administered 2021-03-15: 2 g via INTRAVENOUS
  Filled 2021-03-15: qty 100

## 2021-03-15 MED ORDER — ONDANSETRON HCL 4 MG/2ML IJ SOLN
INTRAMUSCULAR | Status: AC
  Filled 2021-03-15: qty 2

## 2021-03-15 MED ORDER — 0.9 % SODIUM CHLORIDE (POUR BTL) OPTIME
TOPICAL | Status: DC | PRN
  Administered 2021-03-15: 1000 mL

## 2021-03-15 MED ORDER — ACETAMINOPHEN 10 MG/ML IV SOLN: 1000.0000 mg | Freq: Once | INTRAVENOUS | Status: DC | PRN

## 2021-03-15 MED ORDER — OXYCODONE HCL 5 MG PO TABS
5.0000 mg | ORAL_TABLET | ORAL | Status: DC | PRN
  Administered 2021-03-15 – 2021-03-16 (×2): 5 mg via ORAL
  Filled 2021-03-15 (×2): qty 1

## 2021-03-15 MED ORDER — MIDAZOLAM HCL 2 MG/2ML IJ SOLN
INTRAMUSCULAR | Status: AC
  Filled 2021-03-15: qty 2

## 2021-03-15 MED ORDER — FENTANYL CITRATE (PF) 250 MCG/5ML IJ SOLN
INTRAMUSCULAR | Status: DC | PRN
  Administered 2021-03-15 (×2): 50 ug via INTRAVENOUS

## 2021-03-15 MED ORDER — DOCUSATE SODIUM 100 MG PO CAPS
100.0000 mg | ORAL_CAPSULE | Freq: Two times a day (BID) | ORAL | Status: DC
  Administered 2021-03-15 – 2021-03-16 (×2): 100 mg via ORAL
  Filled 2021-03-15 (×4): qty 1

## 2021-03-15 MED ORDER — CHLORHEXIDINE GLUCONATE 0.12 % MT SOLN: 15.0000 mL | Freq: Once | OROMUCOSAL | Status: AC

## 2021-03-15 MED ORDER — LIDOCAINE 2% (20 MG/ML) 5 ML SYRINGE
INTRAMUSCULAR | Status: DC | PRN
  Administered 2021-03-15: 60 mg via INTRAVENOUS

## 2021-03-15 MED ORDER — ACETAMINOPHEN 325 MG PO TABS: 650.0000 mg | ORAL_TABLET | Freq: Four times a day (QID) | ORAL | Status: DC

## 2021-03-15 MED ORDER — PROPOFOL 10 MG/ML IV BOLUS
INTRAVENOUS | Status: AC
  Filled 2021-03-15: qty 20

## 2021-03-15 MED ORDER — POTASSIUM CHLORIDE IN NACL 20-0.9 MEQ/L-% IV SOLN
INTRAVENOUS | Status: DC
  Filled 2021-03-15: qty 1000

## 2021-03-15 MED ORDER — ALPRAZOLAM 0.5 MG PO TABS
0.5000 mg | ORAL_TABLET | Freq: Once | ORAL | Status: AC
  Administered 2021-03-15: 0.5 mg via ORAL
  Filled 2021-03-15: qty 1

## 2021-03-15 MED ORDER — SODIUM ZIRCONIUM CYCLOSILICATE 10 G PO PACK
10.0000 g | PACK | Freq: Every day | ORAL | Status: AC
  Administered 2021-03-15 – 2021-03-16 (×2): 10 g via ORAL
  Filled 2021-03-15 (×2): qty 1

## 2021-03-15 MED ORDER — CHLORHEXIDINE GLUCONATE 0.12 % MT SOLN
OROMUCOSAL | Status: AC
  Administered 2021-03-15: 15 mL via OROMUCOSAL
  Filled 2021-03-15: qty 15

## 2021-03-15 MED ORDER — SODIUM CHLORIDE 0.9 % IV SOLN: INTRAVENOUS | Status: DC

## 2021-03-15 SURGICAL SUPPLY — 51 items
BAG COUNTER SPONGE SURGICOUNT (BAG) ×2 IMPLANT
BIT DRILL INTERTAN LAG SCREW (BIT) ×2 IMPLANT
BIT DRILL SHORT 4.0 (BIT) ×1 IMPLANT
BRUSH SCRUB EZ PLAIN DRY (MISCELLANEOUS) ×4 IMPLANT
CHLORAPREP W/TINT 26 (MISCELLANEOUS) ×2 IMPLANT
COVER PERINEAL POST (MISCELLANEOUS) ×2 IMPLANT
COVER SURGICAL LIGHT HANDLE (MISCELLANEOUS) ×2 IMPLANT
DERMABOND ADHESIVE PROPEN (GAUZE/BANDAGES/DRESSINGS) ×1
DERMABOND ADVANCED (GAUZE/BANDAGES/DRESSINGS) ×1
DERMABOND ADVANCED .7 DNX12 (GAUZE/BANDAGES/DRESSINGS) ×1 IMPLANT
DERMABOND ADVANCED .7 DNX6 (GAUZE/BANDAGES/DRESSINGS) ×1 IMPLANT
DRAPE C-ARM 35X43 STRL (DRAPES) ×2 IMPLANT
DRAPE IMP U-DRAPE 54X76 (DRAPES) ×4 IMPLANT
DRAPE INCISE IOBAN 66X45 STRL (DRAPES) ×2 IMPLANT
DRAPE STERI IOBAN 125X83 (DRAPES) ×2 IMPLANT
DRAPE SURG 17X23 STRL (DRAPES) ×4 IMPLANT
DRAPE U-SHAPE 47X51 STRL (DRAPES) ×2 IMPLANT
DRESSING MEPILEX FLEX 4X4 (GAUZE/BANDAGES/DRESSINGS) ×3 IMPLANT
DRILL BIT SHORT 4.0 (BIT) ×1
DRSG MEPILEX BORDER 4X4 (GAUZE/BANDAGES/DRESSINGS) IMPLANT
DRSG MEPILEX BORDER 4X8 (GAUZE/BANDAGES/DRESSINGS) IMPLANT
DRSG MEPILEX FLEX 4X4 (GAUZE/BANDAGES/DRESSINGS) ×6
ELECT REM PT RETURN 9FT ADLT (ELECTROSURGICAL) ×2
ELECTRODE REM PT RTRN 9FT ADLT (ELECTROSURGICAL) ×1 IMPLANT
GLOVE SURG ENC MOIS LTX SZ6.5 (GLOVE) ×6 IMPLANT
GLOVE SURG ENC MOIS LTX SZ7.5 (GLOVE) ×8 IMPLANT
GLOVE SURG UNDER POLY LF SZ6.5 (GLOVE) ×2 IMPLANT
GLOVE SURG UNDER POLY LF SZ7.5 (GLOVE) ×2 IMPLANT
GOWN STRL REUS W/ TWL LRG LVL3 (GOWN DISPOSABLE) ×1 IMPLANT
GOWN STRL REUS W/TWL LRG LVL3 (GOWN DISPOSABLE) ×1
GUIDE PIN 3.2X343 (PIN) ×2
GUIDE PIN 3.2X343MM (PIN) ×2
GUIDE ROD 3.0 (MISCELLANEOUS) ×2
KIT BASIN OR (CUSTOM PROCEDURE TRAY) ×2 IMPLANT
KIT TURNOVER KIT B (KITS) ×2 IMPLANT
MANIFOLD NEPTUNE II (INSTRUMENTS) IMPLANT
NAIL LOCK CANN 10X360 130D RT (Nail) ×2 IMPLANT
NS IRRIG 1000ML POUR BTL (IV SOLUTION) ×2 IMPLANT
PACK GENERAL/GYN (CUSTOM PROCEDURE TRAY) ×2 IMPLANT
PAD ARMBOARD 7.5X6 YLW CONV (MISCELLANEOUS) ×6 IMPLANT
PIN GUIDE 3.2X343MM (PIN) ×2 IMPLANT
ROD GUIDE 3.0 (MISCELLANEOUS) ×1 IMPLANT
SCREW LAG COMPR KIT 95/90 (Screw) ×2 IMPLANT
SCREW TRIGEN LOW PROF 5.0X40 (Screw) ×2 IMPLANT
SUT MNCRL AB 3-0 PS2 18 (SUTURE) ×2 IMPLANT
SUT VIC AB 0 CT1 27 (SUTURE)
SUT VIC AB 0 CT1 27XBRD ANBCTR (SUTURE) IMPLANT
SUT VIC AB 2-0 CT1 27 (SUTURE) ×2
SUT VIC AB 2-0 CT1 TAPERPNT 27 (SUTURE) ×2 IMPLANT
TOWEL GREEN STERILE (TOWEL DISPOSABLE) ×4 IMPLANT
WATER STERILE IRR 1000ML POUR (IV SOLUTION) ×2 IMPLANT

## 2021-03-15 NOTE — Consult Note (Signed)
Orthopaedic Trauma Service (OTS) Consult   Patient ID: Brianna Porter MRN: [id number] DOB/AGE: 1961-04-06 60 y.o.  Reason for Consult: Right hip fracture Referring Physician: Dr. Fredonia Highland, MD [facility])  HPI: Brianna Porter is an 60 y.o. female being seen in consultation for Dr. Percell Miller for right hip fracture.  Patient fell while getting out of her car yesterday.  Landed on her right hip and had immediate pain.  Was unable to ambulate.  Was seen in [facility] emergency department and found to have right intertrochanteric/subtrochanteric femur fracture.  Orthopedics was consulted for evaluation and management.  Due to surgery and OR availability, Dr. Percell Miller asked OTS to assume care of patient.  Patient seen this morning in ED.  Pain currently well controlled.  Denies injury to any of her other extremities.  Denies any previous injury or surgery to right lower extremity.  Patient has a history of CKD, CAD, CHF, A-flutter on Eliquis.  Last Eliquis dose was evening 03/13/2021.  Patient ambulates with a cane at baseline.  Lives at home with daughter and granddaughter.  Has about 3 steps to enter the home.  Past Medical History:  Diagnosis Date   Acute gastric ulcer with hemorrhage    Altered mental status 05/07/2019   Anxiety and depression    Atrial flutter (HCC)    CAD (coronary artery disease)    Chronic systolic CHF (congestive heart failure) ([place])    a. dx in setting of atrial fib/flutter - possibly tachy mediated. Coronary CTA with only mild CAD in 04/2019.   CKD (chronic kidney disease) stage 3, GFR 30-59 ml/min (HCC) 04/23/2019   Confusion    a. persistent confusion during 04/2019 admission of unclear cause. Home meds adjusted. CT/MRI brain nonacute.   Depression 03/02/2019   Diabetes ([place])    Edema    Elevated liver function tests    Essential hypertension    Falls    Gastrointestinal hemorrhage    Hypercholesteremia    Hyperkalemia    Hyperlipidemia 04/23/2019    Hypertensive heart and chronic kidney disease with systolic congestive heart failure ([place])    Hypertensive heart disease 03/02/2019   Hyperthyroidism    Hypokalemia    Hypokalemia    Hypomagnesemia    Hyponatremia    Iron deficiency anemia    Mild CAD    a. Coronary CT 04/2019 - Minimal, Non-obstructive CAD.   NICM (nonischemic cardiomyopathy) ([place])    Osteoarthritis 03/02/2019   PAF (paroxysmal atrial fibrillation) (HCC)    Pressure injury of skin 07/09/2019   Prolonged QT interval    Symptomatic anemia 06/28/2019    Past Surgical History:  Procedure Laterality Date   BIOPSY  06/29/2019   Procedure: BIOPSY;  Surgeon: Irene Shipper, MD;  Location: [place];  Service: Endoscopy;;   CARDIOVERSION N/A 04/29/2019   Procedure: CARDIOVERSION;  Surgeon: Sanda Klein, MD;  Location: [place] ENDOSCOPY;  Service: Cardiovascular;  Laterality: N/A;   CARDIOVERSION N/A 05/04/2019   Procedure: CARDIOVERSION;  Surgeon: Josue Hector, MD;  Location: [facility] ENDOSCOPY;  Service: Cardiovascular;  Laterality: N/A;   COLONOSCOPY WITH PROPOFOL N/A 07/12/2019   Procedure: COLONOSCOPY WITH PROPOFOL;  Surgeon: Doran Stabler, MD;  Location: [place];  Service: Gastroenterology;  Laterality: N/A;   ENTEROSCOPY N/A 07/10/2019   Procedure: ENTEROSCOPY;  Surgeon: Doran Stabler, MD;  Location: [place];  Service: Gastroenterology;  Laterality: N/A;   ESOPHAGOGASTRODUODENOSCOPY  06/29/2019   ESOPHAGOGASTRODUODENOSCOPY (EGD) WITH PROPOFOL N/A 06/29/2019   Procedure:  ESOPHAGOGASTRODUODENOSCOPY (EGD) WITH PROPOFOL;  Surgeon: Irene Shipper, MD;  Location: [place];  Service: Endoscopy;  Laterality: N/A;   GIVENS CAPSULE STUDY N/A 07/12/2019   Procedure: GIVENS CAPSULE STUDY;  Surgeon: Doran Stabler, MD;  Location: [place];  Service: Gastroenterology;  Laterality: N/A;   HEMOSTASIS CLIP PLACEMENT  07/12/2019   Procedure: HEMOSTASIS CLIP PLACEMENT;  Surgeon: Doran Stabler, MD;  Location: [place];  Service: Gastroenterology;;   POLYPECTOMY  07/12/2019   Procedure: POLYPECTOMY;  Surgeon: Doran Stabler, MD;  Location: [place];  Service: Gastroenterology;;   SMALL BOWEL ENTEROSCOPY  07/10/2019   SUBMUCOSAL TATTOO INJECTION  07/10/2019   Procedure: SUBMUCOSAL TATTOO INJECTION;  Surgeon: Doran Stabler, MD;  Location: [place];  Service: Gastroenterology;;   TEE WITHOUT CARDIOVERSION  04/29/2019   TEE WITHOUT CARDIOVERSION N/A 04/29/2019   Procedure: TRANSESOPHAGEAL ECHOCARDIOGRAM (TEE);  Surgeon: Sanda Klein, MD;  Location: [facility] ENDOSCOPY;  Service: Cardiovascular;  Laterality: N/A;   TUBAL LIGATION      Family History  Problem Relation Age of Onset   Cancer Mother    Heart disease Mother    Hypercholesterolemia Mother    Osteoarthritis Mother    Stroke Mother    Heart disease Father    Hypercholesterolemia Brother     Social History:  reports that she quit smoking about 21 years ago. Her smoking use included cigarettes. She has a 60.00 pack-year smoking history. She has never used smokeless tobacco. She reports that she does not drink alcohol and does not use drugs.  Allergies:  Allergies  Allergen Reactions   Ambien [Zolpidem Tartrate]     Pt and family state this med makes the patient sleepwalk.   Mobic [Meloxicam] Nausea Only    GI pain   Adhesive [Tape]     Tears skin, please use paper tape   Codeine Nausea And Vomiting    Medications: I have reviewed the patient's current medications. Prior to Admission: (Not in a hospital admission)   ROS: Constitutional: No fever or chills Vision: No changes in vision ENT: No difficulty swallowing CV: No chest pain Pulm: No SOB or wheezing GI: No nausea or vomiting GU: No urgency or inability to hold urine Skin: No poor wound healing Neurologic: No numbness or tingling Psychiatric: No depression or anxiety Heme: No bruising Allergic: No reaction to medications or food   Exam: Blood  pressure (!) 144/90, pulse 81, temperature 97.8 F (36.6 C), temperature source Oral, resp. rate 20, height '5\' 2"'$  (1.575 m), weight 66.2 kg, SpO2 96 %. General: Resting in bed comfortably, no acute distress Orientation: Alert and oriented x4 Mood and Affect: Mood and affect appropriate, pleasant and cooperative Gait: Not assessed due to known fracture Coordination and balance: Within normal limits  RLE: Leg shortened and externally rotated.  Tender over the hip.  No significant bruising noted throughout the extremity.  Nontender in the knee, lower leg, ankle, foot.  Ankle dorsiflexion plantarflexion is intact.  Hip and knee motion not assessed due to fracture.  Able to wiggle toes.  Endorses sensation throughout extremity.  Is neurovascularly intact  LLE: Skin without lesions. No tenderness to palpation. Full painless ROM, full strength in each muscle group without evidence of instability.   Medical Decision Making: Data: Imaging: AP pelvis with lateral view of the right hip shows comminuted intertrochanteric femur fracture  Labs:  Results for orders placed or performed during the hospital encounter of 03/14/21 (from the past 24 hour(s))  Comprehensive metabolic panel     Status: Abnormal   Collection Time: 03/14/21  6:12 PM  Result Value Ref Range   Sodium 133 (L) 135 - 145 mmol/L   Potassium 5.7 (H) 3.5 - 5.1 mmol/L   Chloride 107 98 - 111 mmol/L   CO2 17 (L) 22 - 32 mmol/L   Glucose, Bld 135 (H) 70 - 99 mg/dL   BUN 26 (H) 6 - 20 mg/dL   Creatinine, Ser 1.52 (H) 0.44 - 1.00 mg/dL   Calcium 9.1 8.9 - 10.3 mg/dL   Total Protein 6.8 6.5 - 8.1 g/dL   Albumin 3.9 3.5 - 5.0 g/dL   AST 23 15 - 41 U/L   ALT 23 0 - 44 U/L   Alkaline Phosphatase 74 38 - 126 U/L   Total Bilirubin 0.2 (L) 0.3 - 1.2 mg/dL   GFR, Estimated 39 (L) >60 mL/min   Anion gap 9 5 - 15  CBC WITH DIFFERENTIAL     Status: Abnormal   Collection Time: 03/14/21  6:12 PM  Result Value Ref Range   WBC 17.0 (H) 4.0 -  10.5 K/uL   RBC 3.44 (L) 3.87 - 5.11 MIL/uL   Hemoglobin 10.0 (L) 12.0 - 15.0 g/dL   HCT 33.9 (L) 36.0 - 46.0 %   MCV 98.5 80.0 - 100.0 fL   MCH 29.1 26.0 - 34.0 pg   MCHC 29.5 (L) 30.0 - 36.0 g/dL   RDW 13.5 11.5 - 15.5 %   Platelets 346 150 - 400 K/uL   nRBC 0.0 0.0 - 0.2 %   Neutrophils Relative % 80 %   Neutro Abs 13.7 (H) 1.7 - 7.7 K/uL   Lymphocytes Relative 12 %   Lymphs Abs 2.0 0.7 - 4.0 K/uL   Monocytes Relative 6 %   Monocytes Absolute 1.0 0.1 - 1.0 K/uL   Eosinophils Relative 1 %   Eosinophils Absolute 0.2 0.0 - 0.5 K/uL   Basophils Relative 0 %   Basophils Absolute 0.1 0.0 - 0.1 K/uL   Immature Granulocytes 1 %   Abs Immature Granulocytes 0.17 (H) 0.00 - 0.07 K/uL  Resp Panel by RT-PCR (Flu A&B, Covid) Nasopharyngeal Swab     Status: None   Collection Time: 03/14/21  7:33 PM   Specimen: Nasopharyngeal Swab; Nasopharyngeal(NP) swabs in vial transport medium  Result Value Ref Range   SARS Coronavirus 2 by RT PCR NEGATIVE NEGATIVE   Influenza A by PCR NEGATIVE NEGATIVE   Influenza B by PCR NEGATIVE NEGATIVE  Type and screen [place]     Status: None   Collection Time: 03/14/21  8:15 PM  Result Value Ref Range   ABO/RH(D) O POS    Antibody Screen NEG    Sample Expiration      03/17/2021,2359 Performed at [facility], [address], [place], [place] [address]   Comprehensive metabolic panel     Status: Abnormal   Collection Time: 03/15/21 12:31 AM  Result Value Ref Range   Sodium 130 (L) 135 - 145 mmol/L   Potassium 5.2 (H) 3.5 - 5.1 mmol/L   Chloride 105 98 - 111 mmol/L   CO2 14 (L) 22 - 32 mmol/L   Glucose, Bld 121 (H) 70 - 99 mg/dL   BUN 18 6 - 20 mg/dL   Creatinine, Ser 1.05 (H) 0.44 - 1.00 mg/dL   Calcium 8.2 (L) 8.9 - 10.3 mg/dL   Total Protein 4.5 (L) 6.5 - 8.1 g/dL  Albumin 2.6 (L) 3.5 - 5.0 g/dL   AST 16 15 - 41 U/L   ALT 15 0 - 44 U/L   Alkaline Phosphatase 50 38 - 126 U/L   Total Bilirubin 0.1 (L) 0.3 - 1.2 mg/dL    GFR, Estimated >60 >60 mL/min   Anion gap 11 5 - 15  I-Stat arterial blood gas, ED     Status: Abnormal   Collection Time: 03/15/21  2:30 AM  Result Value Ref Range   pH, Arterial 7.412 7.350 - 7.450   pCO2 arterial 29.5 (L) 32.0 - 48.0 mmHg   pO2, Arterial 80 (L) 83.0 - 108.0 mmHg   Bicarbonate 18.8 (L) 20.0 - 28.0 mmol/L   TCO2 20 (L) 22 - 32 mmol/L   O2 Saturation 96.0 %   Acid-base deficit 5.0 (H) 0.0 - 2.0 mmol/L   Sodium 133 (L) 135 - 145 mmol/L   Potassium 5.3 (H) 3.5 - 5.1 mmol/L   Calcium, Ion 1.28 1.15 - 1.40 mmol/L   HCT 23.0 (L) 36.0 - 46.0 %   Hemoglobin 7.8 (L) 12.0 - 15.0 g/dL   Patient temperature 98.6 F    Collection site Radial    Drawn by RT    Sample type ARTERIAL   CBC with Differential     Status: Abnormal   Collection Time: 03/15/21  3:58 AM  Result Value Ref Range   WBC 8.7 4.0 - 10.5 K/uL   RBC 3.07 (L) 3.87 - 5.11 MIL/uL   Hemoglobin 9.0 (L) 12.0 - 15.0 g/dL   HCT 29.2 (L) 36.0 - 46.0 %   MCV 95.1 80.0 - 100.0 fL   MCH 29.3 26.0 - 34.0 pg   MCHC 30.8 30.0 - 36.0 g/dL   RDW 13.5 11.5 - 15.5 %   Platelets 292 150 - 400 K/uL   nRBC 0.0 0.0 - 0.2 %   Neutrophils Relative % 71 %   Neutro Abs 6.3 1.7 - 7.7 K/uL   Lymphocytes Relative 18 %   Lymphs Abs 1.6 0.7 - 4.0 K/uL   Monocytes Relative 7 %   Monocytes Absolute 0.6 0.1 - 1.0 K/uL   Eosinophils Relative 2 %   Eosinophils Absolute 0.1 0.0 - 0.5 K/uL   Basophils Relative 1 %   Basophils Absolute 0.0 0.0 - 0.1 K/uL   Immature Granulocytes 1 %   Abs Immature Granulocytes 0.08 (H) 0.00 - 0.07 K/uL  Urinalysis, Routine w reflex microscopic     Status: Abnormal   Collection Time: 03/15/21  4:24 AM  Result Value Ref Range   Color, Urine STRAW (A) YELLOW   APPearance CLEAR CLEAR   Specific Gravity, Urine 1.009 1.005 - 1.030   pH 6.0 5.0 - 8.0   Glucose, UA NEGATIVE NEGATIVE mg/dL   Hgb urine dipstick NEGATIVE NEGATIVE   Bilirubin Urine NEGATIVE NEGATIVE   Ketones, ur NEGATIVE NEGATIVE mg/dL    Protein, ur NEGATIVE NEGATIVE mg/dL   Nitrite NEGATIVE NEGATIVE   Leukocytes,Ua NEGATIVE NEGATIVE   Assessment/Plan: 60 year old female s/p fall with subsequent right intertrochanteric femur fracture   Patient with significant injury to right lower extremity which will require surgical intervention.  Recommend proceeding with intramedullary nailing today.  Risks and benefits of the procedure discussed with the patient. Risks discussed included bleeding, infection, malunion, nonunion, damage to surrounding nerves and blood vessels, pain, hardware prominence or irritation, hardware failure, stiffness, DVT/PE, compartment syndrome, and anesthesia complications.  Patient states understanding of these risks and agrees to proceed with surgery.  Consent will be obtained. Continue to hold Eliquis, will plan to restart this postoperatively if hemoglobin is stable. Keep NPO.     Marrisa Kimber A. Carmie Kanner [facility] [phone] (office) [email or web address]

## 2021-03-15 NOTE — Op Note (Signed)
Orthopaedic Surgery Operative Note (CSN: [id number] ) Date of Surgery: 03/15/2021  Admit Date: 03/14/2021   Diagnoses: Pre-Op Diagnoses: Right intertrochanteric femur fracture  Post-Op Diagnosis: Same  Procedures: CPT 27245-Cephalomedullary nailing of right intertrochanteric femur fracture  Surgeons : Primary: Shona Needles, MD  Assistant: Patrecia Pace, PA-C  Location: OR 4   Anesthesia:General   Antibiotics: Ancef 2g preop   Tourniquet time:None    Estimated Blood AB-[id number] mL  Complications:None   Specimens:None   Implants: Implant Name Type Inv. Item Serial No. Manufacturer Lot No. LRB No. Used Action  NAIL LOCK CANN 10X360 130D RT - [id number] Nail NAIL LOCK CANN 10X360 130D RT  [employer] [id number] Right 1 Implanted  SCREW LAG COMBO 95.90 - [id number] Screw SCREW LAG COMBO 95.90  [employer] [id number] Right 1 Implanted  SCREW TRIGEN LOW PROF 5.0X40 - [id number] Screw SCREW TRIGEN LOW PROF 5.0X40  [employer] [id number] Right 1 Implanted     Indications for Surgery: 60-year-old female who sustained a right intertrochanteric femur fracture.  Due to the unstable nature of her injury recommend proceeding with cephalomedullary nailing of her right inotrope.  Risks and benefits were discussed with the patient and her daughter.  Risks include but not limited to bleeding, infection, malunion, nonunion, hardware failure, hardware irritation, nerve and blood vessel injury, DVT, even the possibility anesthetic complications.  They agreed to proceed with surgery and consent was obtained.  Operative Findings: Cephalomedullary nailing of right intertrochanteric femur fracture using [employer] InterTAN 10 x 360 mm nail with a 95 mm lag screw/90 mm compression screw.  Procedure: The patient was identified in the preoperative holding area. Consent was confirmed with the patient and their family and all questions were answered. The  operative extremity was marked after confirmation with the patient. she was then brought back to the operating room by our anesthesia colleagues.  She was placed under general anesthetic and carefully transferred over to the [facility] table.  All bony prominences were well-padded.  Fluoroscopic imaging was obtained of the right hip.  Traction was applied and adequate reduction was obtained.  The right lower extremity was then prepped and draped in usual sterile fashion.  A timeout was performed to verify the patient, procedure, and the extremity.  Preoperative antibiotics were dosed.  Incision was made proximal to the greater trochanter.  I directed a threaded guidewire at the tip of the greater trochanter and advanced into the proximal metaphysis.  I confirmed adequate positioning with fluoroscopic guidance.  I then used an entry reamer 10 to the medullary canal.  I felt that the fracture was distal enough to require a long nail.  I then passed a ball-tipped guidewire down the central canal.  I passed a 10.5 mm reamer and obtained just a slight amount of chatter.  I then measured the length and chose to use a 360 mm nail.  The nail was then passed on the central canal and seated insult was appropriately positioned.  A lateral incision was made along the femur.  The threaded guidewire was directed into the head/neck fragment using the targeting arm.  I confirmed adequate tip apex Distance with fluoroscopy.  The length of the guidewire was then measured.  I drilled the path of the compression screw.  I then drilled the path for the lag screw after placing the entire rotation bar.  I placed the lag screw and then advanced the compression screw.  I was able to get  some compression but then the lag screw had pulled out of the femoral head.  As result I removed both of the screws redirected my guidewire a little bit more anterior and superior to access better subchondral bone. We drilled the path for the lag screw.  I  placed the antirotation bar.  And I placed the lag screw and got much better purchase.  I then placed the compression screw and press test a slight amount did not want to pull out the fixation once more.  I then statically locked the nail.  I then removed the targeting arm and perfect circle technique to place a distal interlocking screw from lateral to medial.  Fluoroscopic imaging was then obtained.  The incision was copiously irrigated.  A layered closure of 2-0 Vicryl and 3-0 Monocryl Dermabond was used to close the skin.  Sterile dressings were placed.  The patient was then awoken from anesthesia and taken to the PACU in stable condition.  Post Op Plan/Instructions: Patient be weightbearing as tolerated to the right lower extremity.  She will receive postoperative Ancef.  She will be restarted on Eliquis if her hemoglobin is stable postoperative day 1.  We will have her mobilize with physical and Occupational Therapy.  I was present and performed the entire surgery.  Patrecia Pace, PA-C did assist me throughout the case. An assistant was necessary given the difficulty in approach, maintenance of reduction and ability to instrument the fracture.   Katha Hamming, MD [facility]

## 2021-03-15 NOTE — Progress Notes (Signed)
[facility] PROGRESS NOTE    Progress Note  Brianna Porter  [id number] DOB: Jan 12, 1961 DOA: 03/14/2021 PCP: Cher Nakai, MD     Brief Narrative:   Brianna Porter is an 60 y.o. female past medical history significant for paroxysmal atrial fibrillation on Eliquis, hypothyroidism, chronic systolic and diastolic heart failure with an EF of 45%, kidney disease stage IIIb diabetes mellitus type 2 comes in to the ED complaining of hip pain after she had a mechanical fall on her right side    Assessment/Plan:   Closed right hip fracture [facility]): Secondary to mechanical fall, [name] has been consulted. Eliquis has been held, n.p.o. after midnight. Awaiting orthopedic consultation who recommended proceeding with intramedullary nailing.  Mild hyperkalemia: 5.7 on arrival, probably multifactorial in the setting of volume depletion and potassium supplementation. Hold ACE inhibitor and potassium supplementation Lokelma was given.  And was started on fluid resuscitation. Still elevated will change IV fluids to half-normal saline with potassium supplementation as his bicarbonate is 14, and will start on oral supplementation recheck a basic metabolic panel this afternoon.  Mild metabolic acidosis: Started on oral bicarbonate tablets. KVO IV fluids.  Type 2 diabetes mellitus with stage 3b chronic kidney disease, without long-term current use of insulin (HCC) Currently n.p.o. A1c is pending, continue sliding scale insulin.  Chronic kidney disease, stage 3b (HCC) Creatinine stable continue to monitor.  Hypothyroidism: Continue Synthroid.  Paroxysmal atrial fibrillation: Last dose of Eliquis was on the morning of 03/13/2021. Continue metoprolol continue to hold Eliquis for surgical procedure.  Chronic combined systolic and diastolic heart failure: Will have to be judicious with IV fluids.  Continue metoprolol hold ACE inhibitor.  Anxiety disorder: Continue  Xanax.  Leukocytosis: Likely stress margination in setting of fractures. Has remained afebrile continue to monitor fever curve.   DVT prophylaxis: lovenox Family Communication:none Status is: Inpatient  Remains inpatient appropriate because:Hemodynamically unstable  Dispo: The patient is from: Home              Anticipated d/c is to: Home              Patient currently is not medically stable to d/c.   Difficult to place patient No   Code Status:     Code Status Orders  (From admission, onward)           Start     Ordered   03/14/21 2300  Full code  Continuous        03/14/21 2303           Code Status History     Date Active Date Inactive Code Status Order ID Comments User Context   03/05/2020 0617 03/19/2020 0146 Full Code [id number]  Vianne Bulls, MD ED   12/23/2019 0419 12/30/2019 1826 Full Code [id number]  Truddie Hidden, MD ED   07/09/2019 0710 07/13/2019 1708 Full Code [id number]  Lyndee Hensen, DO ED   06/29/2019 0421 06/30/2019 1935 Full Code [id number]  Kathrene Alu, MD ED   06/28/2019 2131 06/29/2019 0421 Full Code [id number]  Kathrene Alu, MD ED   05/02/2019 1616 05/07/2019 1624 Full Code [id number]  [facility], Tami Lin, PA Inpatient         IV Access:   Peripheral IV   Procedures and diagnostic studies:   DG Chest 1 View  Result Date: 03/14/2021 CLINICAL DATA:  Status post fall. EXAM: CHEST  1 VIEW COMPARISON:  November 13, 2020 FINDINGS: Very mild, stable linear scarring and/or atelectasis is seen  within the upper right lung. There is no evidence of acute infiltrate, pleural effusion or pneumothorax. The heart size and mediastinal contours are within normal limits. The visualized skeletal structures are unremarkable. IMPRESSION: Very mild right upper lobe linear scarring and/or atelectasis. Electronically Signed   By: Virgina Norfolk M.D.   On: 03/14/2021 19:51   DG Hip Unilat With Pelvis 2-3 Views Right  Result Date: 03/14/2021 CLINICAL  DATA:  60 year old female with fall and right hip pain. EXAM: DG HIP (WITH OR WITHOUT PELVIS) 2-3V RIGHT COMPARISON:  None. FINDINGS: There is a comminuted mildly displaced fracture of the right femoral neck involving the intertrochanteric and subtrochanteric region. No dislocation. The bones are osteopenic. The soft tissues are grossly unremarkable. IMPRESSION: Mildly displaced comminuted fracture of the right femoral neck. No dislocation. Electronically Signed   By: Anner Crete M.D.   On: 03/14/2021 19:49     Medical Consultants:   None.   Subjective:    Brianna Porter pain is not controlled.  Objective:    Vitals:   03/15/21 0345 03/15/21 0415 03/15/21 0500 03/15/21 0530  BP: (!) 100/57 128/88 (!) 146/83 (!) 144/90  Pulse: 81 82 83 81  Resp: 14 19 (!) 21 20  Temp:      TempSrc:      SpO2: 99% 97% 96% 96%  Weight:      Height:       SpO2: 96 % O2 Flow Rate (L/min): 2 L/min  No intake or output data in the 24 hours ending 03/15/21 0726 Filed Weights   03/14/21 1814  Weight: 66.2 kg    Exam: General exam: In no acute distress. Respiratory system: Good air movement and clear to auscultation. Cardiovascular system: S1 & S2 heard, RRR. No JVD. Gastrointestinal system: Abdomen is nondistended, soft and nontender.  xtremities: No pedal edema. Skin: No rashes, lesions or ulcers  Data Reviewed:    Labs: Basic Metabolic Panel: Recent Labs  Lab 03/14/21 1812 03/15/21 0031 03/15/21 0230  NA 133* 130* 133*  K 5.7* 5.2* 5.3*  CL 107 105  --   CO2 17* 14*  --   GLUCOSE 135* 121*  --   BUN 26* 18  --   CREATININE 1.52* 1.05*  --   CALCIUM 9.1 8.2*  --    GFR Estimated Creatinine Clearance: 51.5 mL/min (A) (by C-G formula based on SCr of 1.05 mg/dL (H)). Liver Function Tests: Recent Labs  Lab 03/14/21 1812 03/15/21 0031  AST 23 16  ALT 23 15  ALKPHOS 74 50  BILITOT 0.2* 0.1*  PROT 6.8 4.5*  ALBUMIN 3.9 2.6*   No results for input(s): LIPASE,  AMYLASE in the last 168 hours. No results for input(s): AMMONIA in the last 168 hours. Coagulation profile No results for input(s): INR, PROTIME in the last 168 hours. COVID-19 Labs  No results for input(s): DDIMER, FERRITIN, LDH, CRP in the last 72 hours.  Lab Results  Component Value Date   SARSCOV2NAA NEGATIVE 03/14/2021   SARSCOV2NAA NEGATIVE 03/18/2020   [place] NEGATIVE 03/13/2020   [place] NEGATIVE 03/05/2020    CBC: Recent Labs  Lab 03/14/21 1812 03/15/21 0230 03/15/21 0358  WBC 17.0*  --  8.7  NEUTROABS 13.7*  --  6.3  HGB 10.0* 7.8* 9.0*  HCT 33.9* 23.0* 29.2*  MCV 98.5  --  95.1  PLT 346  --  292   Cardiac Enzymes: No results for input(s): CKTOTAL, CKMB, CKMBINDEX, TROPONINI in the last 168 hours. BNP (last 3  results) No results for input(s): PROBNP in the last 8760 hours. CBG: No results for input(s): GLUCAP in the last 168 hours. D-Dimer: No results for input(s): DDIMER in the last 72 hours. Hgb A1c: No results for input(s): HGBA1C in the last 72 hours. Lipid Profile: No results for input(s): CHOL, HDL, LDLCALC, TRIG, CHOLHDL, LDLDIRECT in the last 72 hours. Thyroid function studies: No results for input(s): TSH, T4TOTAL, T3FREE, THYROIDAB in the last 72 hours.  Invalid input(s): FREET3 Anemia work up: No results for input(s): VITAMINB12, FOLATE, FERRITIN, TIBC, IRON, RETICCTPCT in the last 72 hours. Sepsis Labs: Recent Labs  Lab 03/14/21 1812 03/15/21 0358  WBC 17.0* 8.7   Microbiology Recent Results (from the past 240 hour(s))  Resp Panel by RT-PCR (Flu A&B, Covid) Nasopharyngeal Swab     Status: None   Collection Time: 03/14/21  7:33 PM   Specimen: Nasopharyngeal Swab; Nasopharyngeal(NP) swabs in vial transport medium  Result Value Ref Range Status   SARS Coronavirus 2 by RT PCR NEGATIVE NEGATIVE Final    Comment: (NOTE) SARS-CoV-2 target nucleic acids are NOT DETECTED.  The SARS-CoV-2 RNA is generally detectable in upper  respiratory specimens during the acute phase of infection. The lowest concentration of SARS-CoV-2 viral copies this assay can detect is 138 copies/mL. A negative result does not preclude SARS-Cov-2 infection and should not be used as the sole basis for treatment or other patient management decisions. A negative result may occur with  improper specimen collection/handling, submission of specimen other than nasopharyngeal swab, presence of viral mutation(s) within the areas targeted by this assay, and inadequate number of viral copies(<138 copies/mL). A negative result must be combined with clinical observations, patient history, and epidemiological information. The expected result is Negative.  Fact Sheet for Patients:  EntrepreneurPulse.com.au  Fact Sheet for Healthcare Providers:  IncredibleEmployment.be  This test is no t yet approved or cleared by the [place] FDA and  has been authorized for detection and/or diagnosis of SARS-CoV-2 by FDA under an Emergency Use Authorization (EUA). This EUA will remain  in effect (meaning this test can be used) for the duration of the COVID-19 declaration under Section 564(b)(1) of the Act, 21 U.S.C.section 360bbb-3(b)(1), unless the authorization is terminated  or revoked sooner.       Influenza A by PCR NEGATIVE NEGATIVE Final   Influenza B by PCR NEGATIVE NEGATIVE Final    Comment: (NOTE) The Xpert Xpress SARS-CoV-2/FLU/RSV plus assay is intended as an aid in the diagnosis of influenza from Nasopharyngeal swab specimens and should not be used as a sole basis for treatment. Nasal washings and aspirates are unacceptable for Xpert Xpress SARS-CoV-2/FLU/RSV testing.  Fact Sheet for Patients: EntrepreneurPulse.com.au  Fact Sheet for Healthcare Providers: IncredibleEmployment.be  This test is not yet approved or cleared by the [place] FDA and has been  authorized for detection and/or diagnosis of SARS-CoV-2 by FDA under an Emergency Use Authorization (EUA). This EUA will remain in effect (meaning this test can be used) for the duration of the COVID-19 declaration under Section 564(b)(1) of the Act, 21 U.S.C. section 360bbb-3(b)(1), unless the authorization is terminated or revoked.  Performed at [facility], [place] [address], [place], [place] [address]      Medications:    amLODipine  10 mg Oral Daily   buPROPion  300 mg Oral Daily   DULoxetine  60 mg Oral BID   fenofibrate  54 mg Oral Daily   hydrocortisone  5 mg Oral See admin instructions  insulin aspart  0-15 Units Subcutaneous TID AC & HS   levETIRAcetam  500 mg Oral BID   levothyroxine  125 mcg Oral Q0600   metoprolol succinate  100 mg Oral Daily   pregabalin  75 mg Oral BID   Continuous Infusions:  sodium chloride Stopped (03/14/21 2004)    ceFAZolin (ANCEF) IV     lactated ringers 75 mL/hr at 03/15/21 0016      LOS: 1 day   Charlynne Cousins  [facility]  03/15/2021, 7:26 AM

## 2021-03-15 NOTE — Progress Notes (Addendum)
Initial Nutrition Assessment  DOCUMENTATION CODES:   Not applicable  INTERVENTION:   -Ensure Max po daily, each supplement provides 150 kcal and 30 grams of protein -Glucerna Shake po BID, each supplement provides 220 kcal and 10 grams of protein  -MVI with minerals daily  NUTRITION DIAGNOSIS:   Increased nutrient needs related to post-op healing as evidenced by estimated needs.  GOAL:   Patient will meet greater than or equal to 90% of their needs  MONITOR:   PO intake, Supplement acceptance, Diet advancement, Labs, Weight trends, Skin, I & O's  REASON FOR ASSESSMENT:   Consult Assessment of nutrition requirement/status, Hip fracture protocol  ASSESSMENT:   60 year old female with past medical history of hypothyroidism, paroxysmal atrial fibrillation, systolic and diastolic congestive heart failure (Echo 02/2020 EF 45-50% with G2DD), coronary artery disease (coronary CT 04/2019 with mild nonobstructive CAD), hypertension, chronic kidney disease stage IIIb, depression, generalized anxiety disorder and diabetes mellitus type 2 who presents to [facility] emergency department with complaints of right hip pain.  Pt admitted with closed rt hip fracture.   7/27- s/p Procedures: CPT 27245-Cephalomedullary nailing of right intertrochanteric femur fracture  Reviewed I/O's: +450 ml x 24 hours  Pt unavailable at time of visit. RD unable to obtain further nutrition-related history or complete nutrition-focused physical exam at this time.    Pt currently NPO for procedure today.   Reviewed wt hx; pt has experienced a 30.5% wt loss over the past year, however, suspect wt is an outlier (per chart review, UBW around 130-150#).  Pt with increased nutritional needs for post-op healing and would greatly benefit from addition of oral nutrition supplements.   Medications reviewed and include keppra and lactated ringers infusion @ 10 ml/hr.   Lab Results  Component Value Date    HGBA1C 5.9 (H) 04/30/2019   PTA DM medications are none.   Labs reviewed: CBGS: 119-156 (inpatient orders for glycemic control are 0-15 units insulin aspart TID before meals and at bedtime).    Diet Order:   Diet Order             Diet NPO time specified Except for: Sips with Meds  Diet effective midnight                   EDUCATION NEEDS:   Education needs have been addressed  Skin:  Skin Assessment: Skin Integrity Issues: Skin Integrity Issues:: Incisions Incisions: closed rt hip, rt thigh  Last BM:  Unknown  Height:   Ht Readings from Last 1 Encounters:  03/14/21 '5\' 2"'$  (1.575 m)    Weight:   Wt Readings from Last 1 Encounters:  03/14/21 66.2 kg    Ideal Body Weight:  50 kg  BMI:  Body mass index is 26.7 kg/m.  Estimated Nutritional Needs:   Kcal:  1800-2000  Protein:  100-115 grams  Fluid:  > 1.8 L    Loistine Chance, RD, LDN, [place] Registered Dietitian II Certified Diabetes Care and Education Specialist Please refer to [facility] for RD and/or RD on-call/weekend/after hours pager

## 2021-03-15 NOTE — Progress Notes (Signed)
Occupational Therapy Evaluation Patient Details Name: Brianna Porter MRN: [id number] DOB: 1960-10-04 Today's Date: 03/15/2021    History of Present Illness 60 year old female who presents to [facility] emergency department 7/26 with complaints of right hip pain s/p a fall. She was using her cane and had a brief episode of mild lightheadedness causing her to stumble and fall. X-rays confirmed a mildly displaced right femoral neck fracture, and nowpt is s/p INTRAMEDULLARY (IM) NAIL INTERTROCHANTRIC (Right) 7/27. Pt with past medical history of hypothyroidism, paroxysmal atrial fibrillation, systolic and diastolic congestive heart failure (Echo 02/2020 EF 45-50% with G2DD), coronary artery disease (coronary CT 04/2019 with mild nonobstructive CAD), hypertension, chronic kidney disease stage IIIb, depression, generalized anxiety disorder and diabetes mellitus type 2   Clinical Impression   Brianna Porter was evaluated s/p the above fall and associated hip impairments. PTA pt was indep in all ADL/IADLs with use of cane. She lives in a 1 level home with 3 STE with her daughter and granddaughter who can provide assistance at d/c. Upon evaluation, pt was min A for bed mobility and stand pivot with RW to [facility]. Upper body ADLs are set up while sitting EOB, and lower body ADLs are mod A overall. Pt continues to benefit from continued OT acutely. Recommend d/c to SNF.     Follow Up Recommendations  SNF;Supervision/Assistance - 24 hour (Pt agreeable to SNF "if I have to," however she would prefer to d/c home if she is safe with moving. Will update d/c needs acutely as pt progresses.)    Equipment Recommendations  None recommended by OT       Precautions / Restrictions Precautions Precautions: Fall Restrictions Weight Bearing Restrictions: Yes RLE Weight Bearing: Weight bearing as tolerated      Mobility Bed Mobility Overal bed mobility: Needs Assistance Bed Mobility: Supine to Sit;Sit to Supine      Supine to sit: Min assist;HOB elevated Sit to supine: Min assist;HOB elevated   General bed mobility comments: min A for RLE    Transfers Overall transfer level: Needs assistance Equipment used: Rolling walker (2 wheeled) Transfers: Sit to/from [facility] Sit to Stand: Min assist Stand pivot transfers: Min assist            Balance Overall balance assessment: Needs assistance Sitting-balance support: Feet supported Sitting balance-Leahy Scale: Fair     Standing balance support: Bilateral upper extremity supported Standing balance-Leahy Scale: Poor                             ADL either performed or assessed with clinical judgement   ADL Overall ADL's : Needs assistance/impaired Eating/Feeding: Independent;Sitting   Grooming: Wash/dry hands;Wash/dry face;Oral care;Applying deodorant;Set up;Sitting   Upper Body Bathing: Set up;Sitting   Lower Body Bathing: Moderate assistance;Sit to/from stand   Upper Body Dressing : Set up;Sitting   Lower Body Dressing: Moderate assistance;Sit to/from stand   Toilet Transfer: Stand-pivot;BSC;RW;Minimal assistance   Toileting- Water quality scientist and Hygiene: Moderate assistance;Sit to/from stand       Functional mobility during ADLs: Minimal assistance;Rolling walker (stand pivots) General ADL Comments: Pt reporting minimal pain at this time, able to sit EOB and stand pivot to [facility] with min A for balance and safety     Vision Baseline Vision/History: No visual deficits Patient Visual Report: No change from baseline Vision Assessment?: No apparent visual deficits            Pertinent Vitals/Pain Pain Assessment: 0-10  Pain Score: 2  Pain Location: RLE Pain Descriptors / Indicators: Discomfort Pain Intervention(s): Limited activity within patient's tolerance;Monitored during session     Hand Dominance     Extremity/Trunk Assessment Upper Extremity Assessment Upper Extremity  Assessment: Overall WFL for tasks assessed;Generalized weakness   Lower Extremity Assessment Lower Extremity Assessment: Defer to PT evaluation   Cervical / Trunk Assessment Cervical / Trunk Assessment: Normal   Communication Communication Communication: No difficulties   Cognition Arousal/Alertness: Awake/alert Behavior During Therapy: WFL for tasks assessed/performed Overall Cognitive Status: Within Functional Limits for tasks assessed               General Comments  VSS on RA - pt reports she thinks her fall was due to being "over-tired" although pt also reports she has had "too many to count" when asked about the number of falls she has had     Home Living Family/patient expects to be discharged to:: Private residence Living Arrangements: Children;Other relatives (daughter and granddaughter) Available Help at Discharge: Family;Available PRN/intermittently (pt's daughter is there "most of the time") Type of Home: House Home Access: Stairs to enter [employer] of Steps: 3 Entrance Stairs-Rails: Can reach both Home Layout: One level     Bathroom Shower/Tub: Corporate investment banker: Standard Bathroom Accessibility: Yes How Accessible: Accessible via walker Home Equipment: Tira - 2 wheels;Cane - single point;Bedside commode;Tub bench;Wheelchair - manual          Prior Functioning/Environment Level of Independence: Independent with assistive device(s)        Comments: Per report pt ambualtes with a cane, sometimes. indep in ADLs        OT Problem List: Decreased strength;Decreased range of motion;Decreased activity tolerance;Impaired balance (sitting and/or standing);Decreased safety awareness;Decreased knowledge of use of DME or AE;Decreased knowledge of precautions;Pain      OT Treatment/Interventions: Self-care/ADL training;Therapeutic exercise;Therapeutic activities;Balance training;Patient/family education;DME and/or AE  instruction    OT Goals(Current goals can be found in the care plan section) Acute Rehab OT Goals Patient Stated Goal: home OT Goal Formulation: With patient Time For Goal Achievement: 03/29/21 Potential to Achieve Goals: Fair ADL Goals Pt Will Perform Lower Body Bathing: with modified independence;sit to/from stand Pt Will Perform Lower Body Dressing: with modified independence;sit to/from stand Pt Will Transfer to Toilet: with modified independence;stand pivot transfer;bedside commode Pt Will Perform Toileting - Clothing Manipulation and hygiene: with modified independence;sitting/lateral leans  OT Frequency: Min 2X/week   Barriers to D/C: Inaccessible home environment  3 STE          AM-PAC OT "6 Clicks" Daily Activity     Outcome Measure Help from another person eating meals?: None Help from another person taking care of personal grooming?: A Little Help from another person toileting, which includes using toliet, bedpan, or urinal?: A Lot Help from another person bathing (including washing, rinsing, drying)?: A Lot Help from another person to put on and taking off regular upper body clothing?: A Little Help from another person to put on and taking off regular lower body clothing?: A Lot 6 Click Score: 16   End of Session Equipment Utilized During Treatment: Rolling walker;Other (comment) [facility]) Nurse Communication: Mobility status;Precautions;Weight bearing status  Activity Tolerance: Patient tolerated treatment well Patient left: in bed;with call bell/phone within reach;with bed alarm set  OT Visit Diagnosis: Unsteadiness on feet (R26.81);Other abnormalities of gait and mobility (R26.89);Repeated falls (R29.6);Pain;Muscle weakness (generalized) (M62.81);History of falling (Z91.81)  Time: [id number] OT Time Calculation (min): 16 min Charges:  OT General Charges $OT Visit: 1 Visit OT Evaluation $OT Eval Moderate Complexity: 1 Mod    Ailana Cuadrado A  Lennyn Gange 03/15/2021, 5:03 PM

## 2021-03-15 NOTE — Transfer of Care (Signed)
Immediate Anesthesia Transfer of Care Note  Patient: Brianna Porter  Procedure(s) Performed: INTRAMEDULLARY (IM) NAIL INTERTROCHANTRIC (Right)  Patient Location: PACU  Anesthesia Type:General  Level of Consciousness: drowsy and patient cooperative  Airway & Oxygen Therapy: Patient Spontanous Breathing and Patient connected to face mask oxygen  Post-op Assessment: Report given to RN and Post -op Vital signs reviewed and stable  Post vital signs: Reviewed and stable  Last Vitals:  Vitals Value Taken Time  BP 130/49   Temp    Pulse 70   Resp 15   SpO2 100     Last Pain:  Vitals:   03/15/21 0954  TempSrc:   PainSc: 7          Complications: No notable events documented.

## 2021-03-15 NOTE — Progress Notes (Signed)
Patient arrived to [phone] alert and oriented x4. Bed in lowest position, call light in reach. Will continue to monitor

## 2021-03-15 NOTE — ED Notes (Signed)
When I went medicate the pt she was noted to be more altered than previous. Her BP dropped some and I needed to get another IV. When I moved her arm and covers a significant amount of Xanax and Tramadol were in her gown and bedding. MD notified and Charge RN given the medication.

## 2021-03-15 NOTE — Progress Notes (Signed)
Patient states that she does not take insulin at home and does not want insulin in the hospital even though her BS is 220. Educated patient on need for insulin and patient still refused

## 2021-03-15 NOTE — Anesthesia Procedure Notes (Signed)
Procedure Name: Intubation Date/Time: 03/15/2021 10:29 AM Performed by: Kathryne Hitch, CRNA Pre-anesthesia Checklist: Patient identified, Emergency Drugs available, Suction available and Patient being monitored Patient Re-evaluated:Patient Re-evaluated prior to induction Oxygen Delivery Method: Circle system utilized Preoxygenation: Pre-oxygenation with 100% oxygen Induction Type: IV induction Ventilation: Mask ventilation without difficulty Laryngoscope Size: Mac and 3 Grade View: Grade I Tube type: Oral Tube size: 7.0 mm Number of attempts: 1 Airway Equipment and Method: Stylet and Oral airway Placement Confirmation: ETT inserted through vocal cords under direct vision, positive ETCO2 and breath sounds checked- equal and bilateral Secured at: 22 cm Tube secured with: Tape Dental Injury: Teeth and Oropharynx as per pre-operative assessment

## 2021-03-15 NOTE — Progress Notes (Signed)
Orthopedic Tech Progress Note Patient Details:  Brianna Porter 05-20-1961 [id number] Overhead trapeze Patient ID: [name], female   DOB: 01/31/1961, 60 y.o.   MRN: [id number]  [name] 03/15/2021, 8:53 PM

## 2021-03-15 NOTE — Anesthesia Postprocedure Evaluation (Signed)
Anesthesia Post Note  Patient: Brianna Porter  Procedure(s) Performed: INTRAMEDULLARY (IM) NAIL INTERTROCHANTRIC (Right)     Patient location during evaluation: PACU Anesthesia Type: General Level of consciousness: awake and alert Pain management: pain level controlled Vital Signs Assessment: post-procedure vital signs reviewed and stable Respiratory status: spontaneous breathing, nonlabored ventilation, respiratory function stable and patient connected to nasal cannula oxygen Cardiovascular status: blood pressure returned to baseline and stable Postop Assessment: no apparent nausea or vomiting Anesthetic complications: no   No notable events documented.  Last Vitals:  Vitals:   03/15/21 1305 03/15/21 1323  BP: (!) 121/53 121/66  Pulse: 67 71  Resp: 16 14  Temp:  36.8 C  SpO2: 98% 100%    Last Pain:  Vitals:   03/15/21 1250  TempSrc:   PainSc: Asleep                 Belenda Cruise P Aeric Burnham

## 2021-03-15 NOTE — ED Notes (Signed)
Informed consent obtained with provider at bedside.

## 2021-03-15 NOTE — Anesthesia Preprocedure Evaluation (Signed)
Anesthesia Evaluation  Patient identified by MRN, date of birth, ID band Patient awake    Reviewed: Allergy & Precautions, NPO status , Patient's Chart, lab work & pertinent test results  Airway Mallampati: II  TM Distance: >3 FB Neck ROM: Full    Dental  (+) Teeth Intact   Pulmonary neg pulmonary ROS, former smoker,    Pulmonary exam normal        Cardiovascular hypertension, Pt. on medications + CAD and +CHF  + dysrhythmias Atrial Fibrillation  Rhythm:Regular Rate:Normal     Neuro/Psych Seizures -, Well Controlled,  Anxiety Depression    GI/Hepatic Neg liver ROS, PUD, GERD  Medicated,  Endo/Other  diabetes, Type 2Hypothyroidism   Renal/GU CRFRenal disease  negative genitourinary   Musculoskeletal  (+) Arthritis , Osteoarthritis,    Abdominal (+)  Abdomen: soft. Bowel sounds: normal.  Peds  Hematology  (+) anemia ,   Anesthesia Other Findings   Reproductive/Obstetrics                             Anesthesia Physical Anesthesia Plan  ASA: 3  Anesthesia Plan: General   Post-op Pain Management:    Induction: Intravenous  PONV Risk Score and Plan: 3 and Ondansetron, Dexamethasone, Midazolam and Treatment may vary due to age or medical condition  Airway Management Planned: Mask and Oral ETT  Additional Equipment: None  Intra-op Plan:   Post-operative Plan: Extubation in OR  Informed Consent: I have reviewed the patients History and Physical, chart, labs and discussed the procedure including the risks, benefits and alternatives for the proposed anesthesia with the patient or authorized representative who has indicated his/her understanding and acceptance.     Dental advisory given  Plan Discussed with: CRNA  Anesthesia Plan Comments: (Lab Results      Component                Value               Date                      WBC                      8.7                  03/15/2021                HGB                      9.0 (L)             03/15/2021                HCT                      29.2 (L)            03/15/2021                MCV                      95.1                03/15/2021                PLT  292                 03/15/2021           Lab Results      Component                Value               Date                      NA                       133 (L)             03/15/2021                K                        5.3 (H)             03/15/2021                CO2                      14 (L)              03/15/2021                GLUCOSE                  121 (H)             03/15/2021                BUN                      18                  03/15/2021                CREATININE               1.05 (H)            03/15/2021                CALCIUM                  8.2 (L)             03/15/2021                GFRNONAA                 >60                 03/15/2021                GFRAA                    46 (L)              03/18/2020          )        Anesthesia Quick Evaluation

## 2021-03-16 ENCOUNTER — Encounter (HOSPITAL_COMMUNITY): Payer: Self-pay | Admitting: Student

## 2021-03-16 DIAGNOSIS — I5042 Chronic combined systolic (congestive) and diastolic (congestive) heart failure: Secondary | ICD-10-CM | POA: Diagnosis not present

## 2021-03-16 DIAGNOSIS — D72829 Elevated white blood cell count, unspecified: Secondary | ICD-10-CM | POA: Diagnosis not present

## 2021-03-16 DIAGNOSIS — S72001A Fracture of unspecified part of neck of right femur, initial encounter for closed fracture: Secondary | ICD-10-CM | POA: Diagnosis not present

## 2021-03-16 DIAGNOSIS — I480 Paroxysmal atrial fibrillation: Secondary | ICD-10-CM | POA: Diagnosis not present

## 2021-03-16 LAB — CBC
HCT: 21.1 % — ABNORMAL LOW (ref 36.0–46.0)
Hemoglobin: 6.7 g/dL — CL (ref 12.0–15.0)
MCH: 29.6 pg (ref 26.0–34.0)
MCHC: 31.8 g/dL (ref 30.0–36.0)
MCV: 93.4 fL (ref 80.0–100.0)
Platelets: 251 10*3/uL (ref 150–400)
RBC: 2.26 MIL/uL — ABNORMAL LOW (ref 3.87–5.11)
RDW: 13.3 % (ref 11.5–15.5)
WBC: 7.9 10*3/uL (ref 4.0–10.5)
nRBC: 0 % (ref 0.0–0.2)

## 2021-03-16 LAB — HEMOGLOBIN AND HEMATOCRIT, BLOOD
HCT: 25.7 % — ABNORMAL LOW (ref 36.0–46.0)
Hemoglobin: 8.6 g/dL — ABNORMAL LOW (ref 12.0–15.0)

## 2021-03-16 LAB — BASIC METABOLIC PANEL
Anion gap: 7 (ref 5–15)
BUN: 17 mg/dL (ref 6–20)
CO2: 23 mmol/L (ref 22–32)
Calcium: 8.7 mg/dL — ABNORMAL LOW (ref 8.9–10.3)
Chloride: 104 mmol/L (ref 98–111)
Creatinine, Ser: 1.37 mg/dL — ABNORMAL HIGH (ref 0.44–1.00)
GFR, Estimated: 44 mL/min — ABNORMAL LOW (ref >60)
Glucose, Bld: 100 mg/dL — ABNORMAL HIGH (ref 70–99)
Potassium: 4.8 mmol/L (ref 3.5–5.1)
Sodium: 134 mmol/L — ABNORMAL LOW (ref 135–145)

## 2021-03-16 LAB — GLUCOSE, CAPILLARY
Glucose-Capillary: 82 mg/dL (ref 70–99)
Glucose-Capillary: 154 mg/dL — ABNORMAL HIGH (ref 70–99)
Glucose-Capillary: 127 mg/dL — ABNORMAL HIGH (ref 70–99)
Glucose-Capillary: 165 mg/dL — ABNORMAL HIGH (ref 70–99)

## 2021-03-16 LAB — URINE CULTURE

## 2021-03-16 LAB — PREPARE RBC (CROSSMATCH)

## 2021-03-16 MED ORDER — POLYETHYLENE GLYCOL 3350 17 G PO PACK
17.0000 g | PACK | Freq: Two times a day (BID) | ORAL | Status: DC
  Administered 2021-03-16: 17 g via ORAL
  Filled 2021-03-16 (×3): qty 1

## 2021-03-16 MED ORDER — ALPRAZOLAM 0.5 MG PO TABS
0.5000 mg | ORAL_TABLET | Freq: Once | ORAL | Status: AC
  Administered 2021-03-16: 0.5 mg via ORAL
  Filled 2021-03-16: qty 1

## 2021-03-16 MED ORDER — FUROSEMIDE 40 MG PO TABS
40.0000 mg | ORAL_TABLET | Freq: Two times a day (BID) | ORAL | Status: DC
  Administered 2021-03-16 – 2021-03-17 (×2): 40 mg via ORAL
  Filled 2021-03-16 (×3): qty 1

## 2021-03-16 MED ORDER — ALPRAZOLAM 0.5 MG PO TABS
0.5000 mg | ORAL_TABLET | Freq: Three times a day (TID) | ORAL | Status: DC | PRN
  Administered 2021-03-16 – 2021-03-17 (×3): 0.5 mg via ORAL
  Filled 2021-03-16 (×3): qty 1

## 2021-03-16 MED ORDER — SODIUM CHLORIDE 0.9% IV SOLUTION: Freq: Once | INTRAVENOUS | Status: AC

## 2021-03-16 NOTE — Progress Notes (Signed)
Physical Therapy Evaluation Patient Details Name: Brianna Porter MRN: [id number] DOB: 04-21-1961 Today's Date: 03/16/2021   History of Present Illness  60 year old female who presents to [facility] emergency department 7/26 with complaints of right hip pain s/p a fall. She was using her cane and had a brief episode of mild lightheadedness causing her to stumble and fall. X-rays confirmed a mildly displaced right femoral neck fracture, and nowpt is s/p INTRAMEDULLARY (IM) NAIL INTERTROCHANTRIC (Right) 7/27. Pt with past medical history of hypothyroidism, paroxysmal atrial fibrillation, systolic and diastolic congestive heart failure (Echo 02/2020 EF 45-50% with G2DD), coronary artery disease (coronary CT 04/2019 with mild nonobstructive CAD), hypertension, chronic kidney disease stage IIIb, depression, generalized anxiety disorder and diabetes mellitus type 2  Clinical Impression  Pt was seen for mobility on side of bed and then to chair, progresing to [facility] and then chair with pain managed with ice.  Pt is slow and painful, will benefit from rehab stay, but will possibly refuse it.  Pt will try steps if able at hosp, and is expecting to go directly home.  Talked with her about the short stay option for rehab, and hopefully she is going to agree to rehab before going home with her sister.      Follow Up Recommendations SNF;Supervision for mobility/OOB    Equipment Recommendations  Rolling walker with 5" wheels    Recommendations for Other Services       Precautions / Restrictions Precautions Precautions: Fall Precaution Comments: anxious, tearful Restrictions Weight Bearing Restrictions: Yes RLE Weight Bearing: Weight bearing as tolerated      Mobility  Bed Mobility Overal bed mobility: Needs Assistance Bed Mobility: Supine to Sit     Supine to sit: Min assist;HOB elevated     General bed mobility comments: min assist for RLE and then for trunk support     Transfers Overall transfer level: Needs assistance Equipment used: Rolling walker (2 wheeled) Transfers: Sit to/from Stand Sit to Stand: Min assist         General transfer comment: min assist to power up and steady but cannot remove support fully once up  Ambulation/Gait Ambulation/Gait assistance: Min guard Gait Distance (Feet): 10 Feet (5 x 2) Assistive device: Rolling walker (2 wheeled);1 person hand held assist Gait Pattern/deviations: Step-to pattern;Decreased stride length;Wide base of support;Decreased weight shift to right Gait velocity: reduced Gait velocity interpretation: <1.31 ft/sec, indicative of household ambulator General Gait Details: careful steps on RW with effort to get to [facility] and then to chair  Stairs Stairs:  (will try next visit)          Wheelchair Mobility    Modified Rankin (Stroke Patients Only)       Balance Overall balance assessment: History of Falls;Needs assistance Sitting-balance support: Feet supported Sitting balance-Leahy Scale: Fair     Standing balance support: Bilateral upper extremity supported;During functional activity Standing balance-Leahy Scale: Poor                               Pertinent Vitals/Pain Pain Assessment: Faces Faces Pain Scale: Hurts little more Pain Location: RLE Pain Descriptors / Indicators: Discomfort;Guarding Pain Intervention(s): Limited activity within patient's tolerance;Monitored during session;Premedicated before session;Repositioned;Ice applied    Home Living Family/patient expects to be discharged to:: Private residence Living Arrangements: Children;Other relatives Available Help at Discharge: Family;Available PRN/intermittently Type of Home: House Home Access: Stairs to enter Entrance Stairs-Rails: Can reach both Entrance Stairs-Number of  Steps: 3 Home Layout: One level Home Equipment: [place] - 2 wheels;Cane - single point;Bedside commode;Tub bench;Wheelchair -  manual      Prior Function Level of Independence: Independent with assistive device(s)         Comments: SPC     Hand Dominance   Dominant Hand: Right    Extremity/Trunk Assessment   Upper Extremity Assessment Upper Extremity Assessment: Defer to OT evaluation    Lower Extremity Assessment Lower Extremity Assessment: RLE deficits/detail RLE Deficits / Details: 3- hip strength, 3+ knee and ankle RLE: Unable to fully assess due to pain RLE Coordination: decreased gross motor    Cervical / Trunk Assessment Cervical / Trunk Assessment: Normal  Communication   Communication: No difficulties  Cognition Arousal/Alertness: Awake/alert Behavior During Therapy: WFL for tasks assessed/performed Overall Cognitive Status: Within Functional Limits for tasks assessed                                        General Comments General comments (skin integrity, edema, etc.): Pt is up to side of bed then assisted to chair, then to [facility] and back to chair.  Was initially tearful but tx distracted her and helped with being hopeful about better functional progression    Exercises     Assessment/Plan    PT Assessment Patient needs continued PT services  PT Problem List Decreased strength;Decreased activity tolerance;Decreased range of motion;Decreased balance;Decreased mobility;Decreased coordination;Decreased knowledge of use of DME;Decreased skin integrity;Pain       PT Treatment Interventions DME instruction    PT Goals (Current goals can be found in the Care Plan section)  Acute Rehab PT Goals Patient Stated Goal: to go right home PT Goal Formulation: With patient Time For Goal Achievement: 03/30/21 Potential to Achieve Goals: Good    Frequency Min 5X/week   Barriers to discharge Inaccessible home environment;Decreased caregiver support home with some family help but with stairs to enter    Co-evaluation               AM-PAC PT "6 Clicks" Mobility   Outcome Measure Help needed turning from your back to your side while in a flat bed without using bedrails?: A Little Help needed moving from lying on your back to sitting on the side of a flat bed without using bedrails?: A Little Help needed moving to and from a bed to a chair (including a wheelchair)?: A Little Help needed standing up from a chair using your arms (e.g., wheelchair or bedside chair)?: A Little Help needed to walk in hospital room?: A Little Help needed climbing 3-5 steps with a railing? : A Lot 6 Click Score: 17    End of Session Equipment Utilized During Treatment: Gait belt Activity Tolerance: Patient limited by fatigue;Patient limited by pain;Treatment limited secondary to medical complications (Comment) Patient left: in chair;with call bell/phone within reach;with chair alarm set Nurse Communication: Mobility status PT Visit Diagnosis: Unsteadiness on feet (R26.81);Muscle weakness (generalized) (M62.81);Difficulty in walking, not elsewhere classified (R26.2);Pain Pain - Right/Left: Right Pain - part of body: Hip    Time: 1029-1105 PT Time Calculation (min) (ACUTE ONLY): 36 min   Charges:   PT Evaluation $PT Eval Moderate Complexity: 1 Mod PT Treatments $Therapeutic Activity: 8-22 mins      Ramond Dial 03/16/2021, 2:00 PM  Mee Hives, PT MS Acute Rehab Dept. Number: [place]

## 2021-03-16 NOTE — Progress Notes (Signed)
[facility] PROGRESS NOTE    Progress Note  Brianna Porter  [id number] DOB: [birth year] DOA: 03/14/2021 PCP: Cher Nakai, MD     Brief Narrative:   Brianna Porter is an 60 y.o. female past medical history significant for paroxysmal atrial fibrillation on Eliquis, hypothyroidism, chronic systolic and diastolic heart failure with an EF of 45%, kidney disease stage IIIb diabetes mellitus type 2 comes in to the ED complaining of hip pain after she had a mechanical fall on her right side    Assessment/Plan:   Closed right hip fracture [facility]): Orthopedic surgery has been consulted as she is postop for intramedullary nailing on 03/15/2021. Analgesic and anticoagulation per orthopedic surgery. Currently on Tylenol Robaxin Toradol and tramadol as well as Lyrica.   Continue to hold Eliquis. Physical therapy evaluation is pending.  Mild hyperkalemia: 5.7 on arrival, probably multifactorial in the setting of volume depletion and potassium supplementation. Potassium supplementation and ACE inhibitor were held. She was given Lokelma, and her hyperkalemia has resolved.  Hypovolemic hyponatremia: Resolved with IV fluid hydration.  Mild metabolic acidosis: Started on bicarbonate tablets and now resolved.  Type 2 diabetes mellitus with stage 3b chronic kidney disease, without long-term current use of insulin (HCC) Currently on long-acting insulin plus sliding scale blood glucose fairly controlled.  Acute blood loss anemia: She status post 2 units of packed red blood cells.  Chronic kidney disease, stage 3b (HCC) Creatinine stable continue to monitor.  Hypothyroidism: Continue Synthroid.  Paroxysmal atrial fibrillation: Last dose of Eliquis was on the morning of 03/13/2021. Rate controlled continue to hold Eliquis orthopedic surgery to dictate when to start Eliquis.  Chronic combined systolic and diastolic heart failure: Will have to be judicious with IV fluids.  Continue  metoprolol hold ACE inhibitor.  Anxiety disorder: Continue Xanax.  Leukocytosis: Likely stress margination in setting of fractures. Has remained afebrile leukocytosis is resolved.   DVT prophylaxis: lovenox Family Communication:none Status is: Inpatient  Remains inpatient appropriate because:Hemodynamically unstable  Dispo: The patient is from: Home              Anticipated d/c is to: Home              Patient currently is not medically stable to d/c.   Difficult to place patient No   Code Status:     Code Status Orders  (From admission, onward)           Start     Ordered   03/14/21 2300  Full code  Continuous        03/14/21 2303           Code Status History     Date Active Date Inactive Code Status Order ID Comments User Context   03/05/2020 0617 03/19/2020 0146 Full Code [id number]  Vianne Bulls, MD ED   12/23/2019 0419 12/30/2019 1826 Full Code [id number]  Truddie Hidden, MD ED   07/09/2019 0710 07/13/2019 1708 Full Code [id number]  Lyndee Hensen, DO ED   06/29/2019 0421 06/30/2019 1935 Full Code [id number]  Kathrene Alu, MD ED   06/28/2019 2131 06/29/2019 0421 Full Code [id number]  Kathrene Alu, MD ED   05/02/2019 1616 05/07/2019 1624 Full Code [id number]  [facility], Tami Lin, PA Inpatient         IV Access:   Peripheral IV   Procedures and diagnostic studies:   DG Chest 1 View  Result Date: 03/14/2021 CLINICAL DATA:  Status post fall. EXAM: CHEST  1 VIEW COMPARISON:  November 13, 2020 FINDINGS: Very mild, stable linear scarring and/or atelectasis is seen within the upper right lung. There is no evidence of acute infiltrate, pleural effusion or pneumothorax. The heart size and mediastinal contours are within normal limits. The visualized skeletal structures are unremarkable. IMPRESSION: Very mild right upper lobe linear scarring and/or atelectasis. Electronically Signed   By: Virgina Norfolk M.D.   On: 03/14/2021 19:51   DG C-Arm 1-60  Min  Result Date: 03/15/2021 CLINICAL DATA:  Surgery, elective Z41.9 (ICD-10-CM). Additional history provided by technologist: ORIF of right hip fracture. Provided fluoroscopy time 2 minutes, 4 seconds (14.52 mGy). EXAM: RIGHT FEMUR 2 VIEWS; DG C-ARM 1-60 MIN COMPARISON:  Radiographs of the right hip 03/14/2021. FINDINGS: Eight intraoperative fluoroscopic images of the right hip/femur are submitted. On the provided images, there are findings of interval placement of an intramedullary nail within the right femur. There are 2 proximal interlocking screws traversing the femoral head and neck. A single distal interlocking screw is also noted. Hardware traverses a known comminuted proximal right femur fracture. Persistent although improved displacement at the fracture sites. IMPRESSION: Eight intraoperative fluoroscopic images from right femoral intramedullary nail placement for a proximal right femoral fracture, as described. Electronically Signed   By: Kellie Simmering DO   On: 03/15/2021 11:51   DG Hip Unilat With Pelvis 2-3 Views Right  Result Date: 03/14/2021 CLINICAL DATA:  60 year old female with fall and right hip pain. EXAM: DG HIP (WITH OR WITHOUT PELVIS) 2-3V RIGHT COMPARISON:  None. FINDINGS: There is a comminuted mildly displaced fracture of the right femoral neck involving the intertrochanteric and subtrochanteric region. No dislocation. The bones are osteopenic. The soft tissues are grossly unremarkable. IMPRESSION: Mildly displaced comminuted fracture of the right femoral neck. No dislocation. Electronically Signed   By: Anner Crete M.D.   On: 03/14/2021 19:49   DG FEMUR, MIN 2 VIEWS RIGHT  Result Date: 03/15/2021 CLINICAL DATA:  Surgery, elective Z41.9 (ICD-10-CM). Additional history provided by technologist: ORIF of right hip fracture. Provided fluoroscopy time 2 minutes, 4 seconds (14.52 mGy). EXAM: RIGHT FEMUR 2 VIEWS; DG C-ARM 1-60 MIN COMPARISON:  Radiographs of the right hip 03/14/2021.  FINDINGS: Eight intraoperative fluoroscopic images of the right hip/femur are submitted. On the provided images, there are findings of interval placement of an intramedullary nail within the right femur. There are 2 proximal interlocking screws traversing the femoral head and neck. A single distal interlocking screw is also noted. Hardware traverses a known comminuted proximal right femur fracture. Persistent although improved displacement at the fracture sites. IMPRESSION: Eight intraoperative fluoroscopic images from right femoral intramedullary nail placement for a proximal right femoral fracture, as described. Electronically Signed   By: Kellie Simmering DO   On: 03/15/2021 11:51   DG FEMUR PORT, MIN 2 VIEWS RIGHT  Result Date: 03/15/2021 CLINICAL DATA:  Right femur/hip repair for fracture. EXAM: RIGHT FEMUR PORTABLE 2 VIEW COMPARISON:  Intraoperative radiographs 03/15/2021. Right hip radiographs 03/14/2021. FINDINGS: Intramedullary rod present in the femur with a single distal interlocking screw. Two proximal dynamic hip screws extend through the femoral neck. Fracture is reduced. Fracture fragments noted medial to the hip. IMPRESSION: ORIF of the right femur without radiographic evidence for complication. Electronically Signed   By: San Morelle M.D.   On: 03/15/2021 15:27     Medical Consultants:   None.   Subjective:    Brianna Porter pain control with labile emotion has not had a bowel movement in 2 days.  Objective:  Vitals:   03/16/21 0238 03/16/21 0409 03/16/21 0520 03/16/21 0858  BP: (!) 95/52 122/60 106/63 (!) 132/55  Pulse: 75 72 73 75  Resp: '20 17 20 18  '$ Temp: 98.3 F (36.8 C) 98.9 F (37.2 C) 98.2 F (36.8 C) 98.2 F (36.8 C)  TempSrc: Oral Oral Oral Oral  SpO2: 97% 95% 96% 100%  Weight:      Height:       SpO2: 100 % O2 Flow Rate (L/min): 2 L/min   Intake/Output Summary (Last 24 hours) at 03/16/2021 0935 Last data filed at 03/16/2021 0520 Gross per 24  hour  Intake 1943.01 ml  Output 500 ml  Net 1443.01 ml   Filed Weights   03/14/21 1814  Weight: 66.2 kg    Exam: General exam: In no acute distress. Respiratory system: Good air movement and clear to auscultation. Cardiovascular system: S1 & S2 heard, RRR. No JVD. Gastrointestinal system: Abdomen is nondistended, soft and nontender.  Extremities: No pedal edema. Skin: No rashes, lesions or ulcers  Data Reviewed:    Labs: Basic Metabolic Panel: Recent Labs  Lab 03/14/21 1812 03/15/21 0031 03/15/21 0230 03/15/21 1735 03/16/21 0039  NA 133* 130* 133* 133* 134*  K 5.7* 5.2* 5.3* 5.4* 4.8  CL 107 105  --  104 104  CO2 17* 14*  --  21* 23  GLUCOSE 135* 121*  --  270* 100*  BUN 26* 18  --  18 17  CREATININE 1.52* 1.05*  --  1.41* 1.37*  CALCIUM 9.1 8.2*  --  8.8* 8.7*    GFR Estimated Creatinine Clearance: 39.4 mL/min (A) (by C-G formula based on SCr of 1.37 mg/dL (H)). Liver Function Tests: Recent Labs  Lab 03/14/21 1812 03/15/21 0031  AST 23 16  ALT 23 15  ALKPHOS 74 50  BILITOT 0.2* 0.1*  PROT 6.8 4.5*  ALBUMIN 3.9 2.6*    No results for input(s): LIPASE, AMYLASE in the last 168 hours. No results for input(s): AMMONIA in the last 168 hours. Coagulation profile Recent Labs  Lab 03/15/21 1735  INR 1.2   COVID-19 Labs  No results for input(s): DDIMER, FERRITIN, LDH, CRP in the last 72 hours.  Lab Results  Component Value Date   SARSCOV2NAA NEGATIVE 03/14/2021   SARSCOV2NAA NEGATIVE 03/18/2020   SARSCOV2NAA NEGATIVE 03/13/2020   Mylo NEGATIVE 03/05/2020    CBC: Recent Labs  Lab 03/14/21 1812 03/15/21 0230 03/15/21 0358 03/16/21 0039 03/16/21 0800  WBC 17.0*  --  8.7 7.9  --   NEUTROABS 13.7*  --  6.3  --   --   HGB 10.0* 7.8* 9.0* 6.7* 8.6*  HCT 33.9* 23.0* 29.2* 21.1* 25.7*  MCV 98.5  --  95.1 93.4  --   PLT 346  --  292 251  --     Cardiac Enzymes: No results for input(s): CKTOTAL, CKMB, CKMBINDEX, TROPONINI in the last 168  hours. BNP (last 3 results) No results for input(s): PROBNP in the last 8760 hours. CBG: Recent Labs  Lab 03/15/21 0813 03/15/21 1154 03/15/21 1653 03/15/21 2037 03/16/21 0818  GLUCAP 119* 156* 220* 210* 82   D-Dimer: No results for input(s): DDIMER in the last 72 hours. Hgb A1c: No results for input(s): HGBA1C in the last 72 hours. Lipid Profile: No results for input(s): CHOL, HDL, LDLCALC, TRIG, CHOLHDL, LDLDIRECT in the last 72 hours. Thyroid function studies: No results for input(s): TSH, T4TOTAL, T3FREE, THYROIDAB in the last 72 hours.  Invalid input(s): FREET3 Anemia work  up: No results for input(s): VITAMINB12, FOLATE, FERRITIN, TIBC, IRON, RETICCTPCT in the last 72 hours. Sepsis Labs: Recent Labs  Lab 03/14/21 1812 03/15/21 0358 03/16/21 0039  WBC 17.0* 8.7 7.9    Microbiology Recent Results (from the past 240 hour(s))  Resp Panel by RT-PCR (Flu A&B, Covid) Nasopharyngeal Swab     Status: None   Collection Time: 03/14/21  7:33 PM   Specimen: Nasopharyngeal Swab; Nasopharyngeal(NP) swabs in vial transport medium  Result Value Ref Range Status   SARS Coronavirus 2 by RT PCR NEGATIVE NEGATIVE Final    Comment: (NOTE) SARS-CoV-2 target nucleic acids are NOT DETECTED.  The SARS-CoV-2 RNA is generally detectable in upper respiratory specimens during the acute phase of infection. The lowest concentration of SARS-CoV-2 viral copies this assay can detect is 138 copies/mL. A negative result does not preclude SARS-Cov-2 infection and should not be used as the sole basis for treatment or other patient management decisions. A negative result may occur with  improper specimen collection/handling, submission of specimen other than nasopharyngeal swab, presence of viral mutation(s) within the areas targeted by this assay, and inadequate number of viral copies(<138 copies/mL). A negative result must be combined with clinical observations, patient history, and  epidemiological information. The expected result is Negative.  Fact Sheet for Patients:  EntrepreneurPulse.com.au  Fact Sheet for Healthcare Providers:  IncredibleEmployment.be  This test is no t yet approved or cleared by the [place] FDA and  has been authorized for detection and/or diagnosis of SARS-CoV-2 by FDA under an Emergency Use Authorization (EUA). This EUA will remain  in effect (meaning this test can be used) for the duration of the COVID-19 declaration under Section 564(b)(1) of the Act, 21 U.S.C.section 360bbb-3(b)(1), unless the authorization is terminated  or revoked sooner.       Influenza A by PCR NEGATIVE NEGATIVE Final   Influenza B by PCR NEGATIVE NEGATIVE Final    Comment: (NOTE) The Xpert Xpress SARS-CoV-2/FLU/RSV plus assay is intended as an aid in the diagnosis of influenza from Nasopharyngeal swab specimens and should not be used as a sole basis for treatment. Nasal washings and aspirates are unacceptable for Xpert Xpress SARS-CoV-2/FLU/RSV testing.  Fact Sheet for Patients: EntrepreneurPulse.com.au  Fact Sheet for Healthcare Providers: IncredibleEmployment.be  This test is not yet approved or cleared by the [place] FDA and has been authorized for detection and/or diagnosis of SARS-CoV-2 by FDA under an Emergency Use Authorization (EUA). This EUA will remain in effect (meaning this test can be used) for the duration of the COVID-19 declaration under Section 564(b)(1) of the Act, 21 U.S.C. section 360bbb-3(b)(1), unless the authorization is terminated or revoked.  Performed at [facility], [place] [address], [place], [place] [address]   Urine Culture     Status: Abnormal   Collection Time: 03/15/21  4:20 AM   Specimen: Urine, Clean Catch  Result Value Ref Range Status   Specimen Description URINE, CLEAN CATCH  Final   Special Requests   Final     NONE Performed at [facility], [place] [address], [place], [place] [address]    Culture MULTIPLE SPECIES PRESENT, SUGGEST RECOLLECTION (A)  Final   Report Status 03/16/2021 FINAL  Final  Surgical pcr screen     Status: None   Collection Time: 03/15/21  9:51 AM   Specimen: Nasal Mucosa; Nasal Swab  Result Value Ref Range Status   MRSA, PCR NEGATIVE NEGATIVE Final   Staphylococcus aureus NEGATIVE NEGATIVE Final  Comment: (NOTE) The Xpert SA Assay (FDA approved for NASAL specimens in patients 78 years of age and older), is one component of a comprehensive surveillance program. It is not intended to diagnose infection nor to guide or monitor treatment. Performed at [facility], [place] [address] [place], [place] [address]      Medications:    acetaminophen  650 mg Oral Q6H   amLODipine  10 mg Oral Daily   buPROPion  300 mg Oral Daily   docusate sodium  100 mg Oral BID   DULoxetine  60 mg Oral BID   fenofibrate  54 mg Oral Daily   hydrocortisone  15 mg Oral Q breakfast   And   hydrocortisone  5 mg Oral Daily   insulin aspart  0-15 Units Subcutaneous TID AC & HS   levETIRAcetam  500 mg Oral BID   levothyroxine  125 mcg Oral Q0600   metoprolol succinate  100 mg Oral Daily   pregabalin  75 mg Oral BID   Continuous Infusions:  sodium chloride 75 mL/hr at 03/15/21 2115    ceFAZolin (ANCEF) IV 2 g (03/16/21 0525)   methocarbamol (ROBAXIN) IV        LOS: 2 days   Charlynne Cousins  [facility]  03/16/2021, 9:35 AM

## 2021-03-16 NOTE — Progress Notes (Addendum)
Received critical lab result hemoglobin-6.7 Notfied on call Donia Pounds with new order to give 1 unit PRBC. And H&H post transfusion.

## 2021-03-16 NOTE — Progress Notes (Signed)
Orthopaedic Trauma Progress Note  SUBJECTIVE: Doing okay this morning.  Having pain in the right hip and thigh.  Asking for tramadol which is one of her home medications.  Also takes Xanax 0.5 mg 3 times daily at home for anxiety/sleep.  She is asking if this can be added to her hospital medications as she had a very restless night.  Denies chest pain. No SOB. No nausea/vomiting. No other complaints.  Was able to work with occupational therapy yesterday and get up to bedside commode.  Patient would like to return home with home health if she is able.  Would like to avoid rehab facility if possible.  Notes that she has a wheelchair, walker, tub bench at home already.  Also notes that her daughter and granddaughter are home to assist her if needed.  OBJECTIVE:  Vitals:   03/16/21 0409 03/16/21 0520  BP: 122/60 106/63  Pulse: 72 73  Resp: 17 20  Temp: 98.9 F (37.2 C) 98.2 F (36.8 C)  SpO2: 95% 96%    General: Laying in bed, no acute distress Respiratory: No increased work of breathing.  RLE: Dressings clean, dry, intact.  Has some tenderness over the hip and throughout the thigh as expected.  Compartments soft and compressible.  Ankle dorsiflexion/plantarflexion is intact.  Able to wiggle her toes.  Neurovascularly intact.  Foot warm and well-perfused.  IMAGING: Stable post op imaging.   LABS:  Results for orders placed or performed during the hospital encounter of 03/14/21 (from the past 24 hour(s))  CBG monitoring, ED     Status: Abnormal   Collection Time: 03/15/21  8:13 AM  Result Value Ref Range   Glucose-Capillary 119 (H) 70 - 99 mg/dL  Surgical pcr screen     Status: None   Collection Time: 03/15/21  9:51 AM   Specimen: Nasal Mucosa; Nasal Swab  Result Value Ref Range   MRSA, PCR NEGATIVE NEGATIVE   Staphylococcus aureus NEGATIVE NEGATIVE  Glucose, capillary     Status: Abnormal   Collection Time: 03/15/21 11:54 AM  Result Value Ref Range   Glucose-Capillary 156 (H) 70 - 99  mg/dL  Glucose, capillary     Status: Abnormal   Collection Time: 03/15/21  4:53 PM  Result Value Ref Range   Glucose-Capillary 220 (H) 70 - 99 mg/dL  HIV Antibody (routine testing w rflx)     Status: None   Collection Time: 03/15/21  5:35 PM  Result Value Ref Range   HIV Screen 4th Generation wRfx Non Reactive Non Reactive  Protime-INR     Status: None   Collection Time: 03/15/21  5:35 PM  Result Value Ref Range   Prothrombin Time 15.1 11.4 - 15.2 seconds   INR 1.2 0.8 - 1.2  APTT     Status: None   Collection Time: 03/15/21  5:35 PM  Result Value Ref Range   aPTT 35 24 - 36 seconds  Basic metabolic panel     Status: Abnormal   Collection Time: 03/15/21  5:35 PM  Result Value Ref Range   Sodium 133 (L) 135 - 145 mmol/L   Potassium 5.4 (H) 3.5 - 5.1 mmol/L   Chloride 104 98 - 111 mmol/L   CO2 21 (L) 22 - 32 mmol/L   Glucose, Bld 270 (H) 70 - 99 mg/dL   BUN 18 6 - 20 mg/dL   Creatinine, Ser 1.41 (H) 0.44 - 1.00 mg/dL   Calcium 8.8 (L) 8.9 - 10.3 mg/dL   GFR, Estimated  43 (L) >60 mL/min   Anion gap 8 5 - 15  VITAMIN D 25 Hydroxy (Vit-D Deficiency, Fractures)     Status: Abnormal   Collection Time: 03/15/21  5:35 PM  Result Value Ref Range   Vit D, 25-Hydroxy 25.03 (L) 30 - 100 ng/mL  Glucose, capillary     Status: Abnormal   Collection Time: 03/15/21  8:37 PM  Result Value Ref Range   Glucose-Capillary 210 (H) 70 - 99 mg/dL  Basic metabolic panel     Status: Abnormal   Collection Time: 03/16/21 12:39 AM  Result Value Ref Range   Sodium 134 (L) 135 - 145 mmol/L   Potassium 4.8 3.5 - 5.1 mmol/L   Chloride 104 98 - 111 mmol/L   CO2 23 22 - 32 mmol/L   Glucose, Bld 100 (H) 70 - 99 mg/dL   BUN 17 6 - 20 mg/dL   Creatinine, Ser 1.37 (H) 0.44 - 1.00 mg/dL   Calcium 8.7 (L) 8.9 - 10.3 mg/dL   GFR, Estimated 44 (L) >60 mL/min   Anion gap 7 5 - 15  CBC     Status: Abnormal   Collection Time: 03/16/21 12:39 AM  Result Value Ref Range   WBC 7.9 4.0 - 10.5 K/uL   RBC 2.26 (L)  3.87 - 5.11 MIL/uL   Hemoglobin 6.7 (LL) 12.0 - 15.0 g/dL   HCT 21.1 (L) 36.0 - 46.0 %   MCV 93.4 80.0 - 100.0 fL   MCH 29.6 26.0 - 34.0 pg   MCHC 31.8 30.0 - 36.0 g/dL   RDW 13.3 11.5 - 15.5 %   Platelets 251 150 - 400 K/uL   nRBC 0.0 0.0 - 0.2 %  Prepare RBC (crossmatch)     Status: None   Collection Time: 03/16/21  1:52 AM  Result Value Ref Range   Order Confirmation      ORDER PROCESSED BY BLOOD BANK Performed at [facility], [address], [place], [place] [address]     ASSESSMENT: Brianna Porter is a 60 y.o. female, 1 Day Post-Op s/p INTRAMEDULLARY NAIL RIGHT INTERTROCHANTRIC FEMUR FRACTURE  CV/Blood loss: Acute blood loss anemia, Hgb 6.7 this AM. 1 unit PRBCs ordered. Hemodynamically stable  PLAN: Weightbearing: WBAT RLE ROM: Okay for unrestricted hip and knee motion Incisional and dressing care: Reinforce dressings as needed. Plan to remove tomorrow  Showering: ok to begin showering with assistance on 03/18/21 if no drainage from incisions Orthopedic device(s): None  Pain management:  1. Tylenol 650 mg q 6 hours scheduled 2. Robaxin 500 mg q 6 hours PRN 3. Oxycodone 5 mg q 4 hours PRN severe pain 4. Tramadol 50 mg q 6 hours PRN moderate pain 5. Lyrica 75 mg BID 6. Toradol 30 mg q 6 hours PRN VTE prophylaxis:  Hold Eliquis until Hgb stabilizes , SCDs ID:  Ancef 2gm post op Foley/Lines:  No foley, KVO IVFs Impediments to Fracture Healing: Vit D level 25, continue on home dose D2 supplementation Dispo: PT/OT eval, currently recommending SNF. Continue to monitor CBC. Plan to remove RLE dressings tomorrow Follow - up plan: 2 weeks  Contact information:  Katha Hamming MD, Patrecia Pace PA-C. After hours and holidays please check [email or web address] for group call information for [facility]   Cato Liburd A. Ricci Barker, PA-C [phone] (office) [email or web address]

## 2021-03-17 DIAGNOSIS — I5042 Chronic combined systolic (congestive) and diastolic (congestive) heart failure: Secondary | ICD-10-CM | POA: Diagnosis not present

## 2021-03-17 DIAGNOSIS — S72001A Fracture of unspecified part of neck of right femur, initial encounter for closed fracture: Secondary | ICD-10-CM | POA: Diagnosis not present

## 2021-03-17 DIAGNOSIS — F411 Generalized anxiety disorder: Secondary | ICD-10-CM | POA: Diagnosis not present

## 2021-03-17 DIAGNOSIS — I480 Paroxysmal atrial fibrillation: Secondary | ICD-10-CM | POA: Diagnosis not present

## 2021-03-17 LAB — BPAM RBC
Blood Product Expiration Date: 202208292359
ISSUE DATE / TIME: 202207280216
Unit Type and Rh: 5100

## 2021-03-17 LAB — TYPE AND SCREEN
ABO/RH(D): O POS
Antibody Screen: NEGATIVE
Unit division: 0

## 2021-03-17 LAB — BASIC METABOLIC PANEL
Anion gap: 11 (ref 5–15)
BUN: 24 mg/dL — ABNORMAL HIGH (ref 6–20)
CO2: 24 mmol/L (ref 22–32)
Calcium: 9.1 mg/dL (ref 8.9–10.3)
Chloride: 101 mmol/L (ref 98–111)
Creatinine, Ser: 1.44 mg/dL — ABNORMAL HIGH (ref 0.44–1.00)
GFR, Estimated: 42 mL/min — ABNORMAL LOW (ref >60)
Glucose, Bld: 89 mg/dL (ref 70–99)
Potassium: 4.1 mmol/L (ref 3.5–5.1)
Sodium: 136 mmol/L (ref 135–145)

## 2021-03-17 LAB — CBC
HCT: 27.1 % — ABNORMAL LOW (ref 36.0–46.0)
Hemoglobin: 8.9 g/dL — ABNORMAL LOW (ref 12.0–15.0)
MCH: 30.4 pg (ref 26.0–34.0)
MCHC: 32.8 g/dL (ref 30.0–36.0)
MCV: 92.5 fL (ref 80.0–100.0)
Platelets: 279 10*3/uL (ref 150–400)
RBC: 2.93 MIL/uL — ABNORMAL LOW (ref 3.87–5.11)
RDW: 13.4 % (ref 11.5–15.5)
WBC: 7.1 10*3/uL (ref 4.0–10.5)
nRBC: 0.3 % — ABNORMAL HIGH (ref 0.0–0.2)

## 2021-03-17 LAB — GLUCOSE, CAPILLARY
Glucose-Capillary: 75 mg/dL (ref 70–99)
Glucose-Capillary: 147 mg/dL — ABNORMAL HIGH (ref 70–99)

## 2021-03-17 MED ORDER — HYDROCODONE-ACETAMINOPHEN 5-325 MG PO TABS: 1.0000 | ORAL_TABLET | Freq: Four times a day (QID) | ORAL | 0 refills | Status: DC | PRN

## 2021-03-17 MED ORDER — HYDROCODONE-ACETAMINOPHEN 5-325 MG PO TABS
1.0000 | ORAL_TABLET | Freq: Four times a day (QID) | ORAL | Status: DC | PRN
  Administered 2021-03-17: 1 via ORAL
  Filled 2021-03-17: qty 1

## 2021-03-17 MED ORDER — TRAMADOL HCL 50 MG PO TABS: 50.0000 mg | ORAL_TABLET | Freq: Four times a day (QID) | ORAL | 0 refills | Status: DC | PRN

## 2021-03-17 MED ORDER — METHOCARBAMOL 500 MG PO TABS: 500.0000 mg | ORAL_TABLET | Freq: Four times a day (QID) | ORAL | 0 refills | Status: DC | PRN

## 2021-03-17 NOTE — Progress Notes (Signed)
Orthopaedic Trauma Progress Note  SUBJECTIVE: Doing well this morning.  Pain is manageable.  Notes that tramadol was not helping, is asking if we can switch her to hydrocodone which she has tolerated well in the past with good pain control.  I have made that adjustment.  PT/OT recommending SNF, patient would prefer to go home.  Notes that she has all the equipment necessary as well as available help at home.  She would like to discharge today if possible.  OBJECTIVE:  Vitals:   03/16/21 2053 03/17/21 0416  BP: 136/68 (!) 168/57  Pulse: 75 67  Resp: 17 18  Temp: 98.1 F (36.7 C) 97.9 F (36.6 C)  SpO2: 97% 100%    General: Laying in bed, no acute distress Respiratory: No increased work of breathing.  RLE: Dressings removed, incisions are clean, dry, intact.  Has some tenderness over the hip and throughout the thigh as expected.  Compartments soft and compressible.  Ankle dorsiflexion/plantarflexion is intact.  Able to wiggle her toes.  Neurovascularly intact.  Foot warm and well-perfused.  IMAGING: Stable post op imaging.   LABS:  Results for orders placed or performed during the hospital encounter of 03/14/21 (from the past 24 hour(s))  Hemoglobin and hematocrit, blood     Status: Abnormal   Collection Time: 03/16/21  8:00 AM  Result Value Ref Range   Hemoglobin 8.6 (L) 12.0 - 15.0 g/dL   HCT 25.7 (L) 36.0 - 46.0 %  Glucose, capillary     Status: None   Collection Time: 03/16/21  8:18 AM  Result Value Ref Range   Glucose-Capillary 82 70 - 99 mg/dL  Glucose, capillary     Status: Abnormal   Collection Time: 03/16/21 11:40 AM  Result Value Ref Range   Glucose-Capillary 154 (H) 70 - 99 mg/dL  Glucose, capillary     Status: Abnormal   Collection Time: 03/16/21  4:57 PM  Result Value Ref Range   Glucose-Capillary 127 (H) 70 - 99 mg/dL  Glucose, capillary     Status: Abnormal   Collection Time: 03/16/21  8:55 PM  Result Value Ref Range   Glucose-Capillary 165 (H) 70 - 99 mg/dL   CBC     Status: Abnormal   Collection Time: 03/17/21  4:23 AM  Result Value Ref Range   WBC 7.1 4.0 - 10.5 K/uL   RBC 2.93 (L) 3.87 - 5.11 MIL/uL   Hemoglobin 8.9 (L) 12.0 - 15.0 g/dL   HCT 27.1 (L) 36.0 - 46.0 %   MCV 92.5 80.0 - 100.0 fL   MCH 30.4 26.0 - 34.0 pg   MCHC 32.8 30.0 - 36.0 g/dL   RDW 13.4 11.5 - 15.5 %   Platelets 279 150 - 400 K/uL   nRBC 0.3 (H) 0.0 - 0.2 %  Basic metabolic panel     Status: Abnormal   Collection Time: 03/17/21  4:23 AM  Result Value Ref Range   Sodium 136 135 - 145 mmol/L   Potassium 4.1 3.5 - 5.1 mmol/L   Chloride 101 98 - 111 mmol/L   CO2 24 22 - 32 mmol/L   Glucose, Bld 89 70 - 99 mg/dL   BUN 24 (H) 6 - 20 mg/dL   Creatinine, Ser 1.44 (H) 0.44 - 1.00 mg/dL   Calcium 9.1 8.9 - 10.3 mg/dL   GFR, Estimated 42 (L) >60 mL/min   Anion gap 11 5 - 15  Glucose, capillary     Status: None   Collection Time: 03/17/21  7:38 AM  Result Value Ref Range   Glucose-Capillary 75 70 - 99 mg/dL    ASSESSMENT: Brianna Porter is a 60 y.o. female, 2 Days Post-Op s/p INTRAMEDULLARY NAIL RIGHT INTERTROCHANTRIC FEMUR FRACTURE  CV/Blood loss: Acute blood loss anemia, Hgb 8.9 this AM. Received 1 unit PRBCs 03/16/21.   PLAN: Weightbearing: WBAT RLE ROM: Okay for unrestricted hip and knee motion Incisional and dressing care: Ok to leave incisions open to air Showering: ok to begin showering with assistance on 03/18/21 if no drainage from incisions Orthopedic device(s): None  Pain management:  1. Tylenol 650 mg q 6 hours scheduled 2. Robaxin 500 mg q 6 hours PRN 3. Hydrocodone 5/325 mg q 6 hours PRN  4. Lyrica 75 mg BID 5. Toradol 30 mg q 6 hours PRN VTE prophylaxis: Ok to restart Eliquis today, SCDs ID:  Ancef 2gm post op completed Foley/Lines:  No foley, KVO IVFs Impediments to Fracture Healing: Vit D level 25, continue on home dose D2 supplementation Dispo: Therapies as tolerated, PT/OT recommending SNF but patient prefers to go home. Patient ok for  d/c from ortho standpoint once cleared by medicine team and therapies. Have sent rx for pain medication to pharmacy on file.  Continue home dose Eliquis for DVT prophylaxis  Follow - up plan: 2 weeks after d/c for repeat x-rays and wound check  Contact information:  Katha Hamming MD, Patrecia Pace PA-C. After hours and holidays please check [email or web address] for group call information for [facility]   Carlye Panameno A. Ricci Barker, PA-C [phone] (office) [email or web address]

## 2021-03-17 NOTE — Discharge Instructions (Signed)
Orthopaedic Trauma Service Discharge Instructions   General Discharge Instructions  WEIGHT BEARING STATUS: Weightbearing as tolerated in her extremity  RANGE OF MOTION/ACTIVITY: Okay for hip and knee motion as tolerated  Wound Care: Incisions can be left open to air if there is no drainage. If incision continues to have drainage, follow wound care instructions below. Okay to shower if no drainage from incisions.  DVT/PE prophylaxis:  Eliquis  Diet: as you were eating previously.  Can use over the counter stool softeners and bowel preparations, such as Miralax, to help with bowel movements.  Narcotics can be constipating.  Be sure to drink plenty of fluids  PAIN MEDICATION USE AND EXPECTATIONS  You have likely been given narcotic medications to help control your pain.  After a traumatic event that results in an fracture (broken bone) with or without surgery, it is ok to use narcotic pain medications to help control one's pain.  We understand that everyone responds to pain differently and each individual patient will be evaluated on a regular basis for the continued need for narcotic medications. Ideally, narcotic medication use should last no more than 6-8 weeks (coinciding with fracture healing).   As a patient it is your responsibility as well to monitor narcotic medication use and report the amount and frequency you use these medications when you come to your office visit.   We would also advise that if you are using narcotic medications, you should take a dose prior to therapy to maximize you participation.  IF YOU ARE ON NARCOTIC MEDICATIONS IT IS NOT PERMISSIBLE TO OPERATE A MOTOR VEHICLE (MOTORCYCLE/CAR/TRUCK/MOPED) OR HEAVY MACHINERY DO NOT MIX NARCOTICS WITH OTHER CNS (CENTRAL NERVOUS SYSTEM) DEPRESSANTS SUCH AS ALCOHOL   STOP SMOKING OR USING NICOTINE PRODUCTS!!!!  As discussed nicotine severely impairs your body's ability to heal surgical and traumatic wounds but also impairs  bone healing.  Wounds and bone heal by forming microscopic blood vessels (angiogenesis) and nicotine is a vasoconstrictor (essentially, shrinks blood vessels).  Therefore, if vasoconstriction occurs to these microscopic blood vessels they essentially disappear and are unable to deliver necessary nutrients to the healing tissue.  This is one modifiable factor that you can do to dramatically increase your chances of healing your injury.    (This means no smoking, no nicotine gum, patches, etc)  DO NOT USE NONSTEROIDAL ANTI-INFLAMMATORY DRUGS (NSAID'S)  Using products such as Advil (ibuprofen), Aleve (naproxen), Motrin (ibuprofen) for additional pain control during fracture healing can delay and/or prevent the healing response.  If you would like to take over the counter (OTC) medication, Tylenol (acetaminophen) is ok.  However, some narcotic medications that are given for pain control contain acetaminophen as well. Therefore, you should not exceed more than 4000 mg of tylenol in a day if you do not have liver disease.  Also note that there are may OTC medicines, such as cold medicines and allergy medicines that my contain tylenol as well.  If you have any questions about medications and/or interactions please ask your doctor/PA or your pharmacist.      ICE AND ELEVATE INJURED/OPERATIVE EXTREMITY  Using ice and elevating the injured extremity above your heart can help with swelling and pain control.  Icing in a pulsatile fashion, such as 20 minutes on and 20 minutes off, can be followed.    Do not place ice directly on skin. Make sure there is a barrier between to skin and the ice pack.    Using frozen items such as frozen peas  works well as the conform nicely to the are that needs to be iced.  USE AN ACE WRAP OR TED HOSE FOR SWELLING CONTROL  In addition to icing and elevation, Ace wraps or TED hose are used to help limit and resolve swelling.  It is recommended to use Ace wraps or TED hose until you are  informed to stop.    When using Ace Wraps start the wrapping distally (farthest away from the body) and wrap proximally (closer to the body)   Example: If you had surgery on your leg or thing and you do not have a splint on, start the ace wrap at the toes and work your way up to the thigh        If you had surgery on your upper extremity and do not have a splint on, start the ace wrap at your fingers and work your way up to the upper arm  [place] or medication refills: [phone]   VISIT OUR WEBSITE FOR ADDITIONAL INFORMATION: [email or web address]     Discharge Wound Care Instructions  Do NOT apply any ointments, solutions or lotions to pin sites or surgical wounds.  These prevent needed drainage and even though solutions like hydrogen peroxide kill bacteria, they also damage cells lining the pin sites that help fight infection.  Applying lotions or ointments can keep the wounds moist and can cause them to breakdown and open up as well. This can increase the risk for infection. When in doubt call the office.  If any drainage is noted, use foam dressing.  Once the incision is completely dry and without drainage, it may be left open to air out.  Showering may begin 36-48 hours later.  Cleaning gently with soap and water.

## 2021-03-17 NOTE — TOC CAGE-AID Note (Signed)
Transition of Care [facility]) - CAGE-AID Screening   Patient Details  Name: PRISCILLE COINER MRN: [id number] Date of Birth: 1961-08-01  Transition [facility]) CM/SW Contact:    Thi Sisemore C Tarpley-Carter, Rosalia Phone Number: 03/17/2021, 10:32 AM   Clinical Narrative: Pt participated in [place].  Pt stated that she does not use substance and/or ETOH.  Pt was not offered resources, due to no use of substance or ETOH.    Kebin Maye Tarpley-Carter, MSW, LCSW-A Pronouns:  She/Her/Hers [facility] HealthTransitions of Care Clinical Social Worker Direct Number:  [phone] [email or web address]   CAGE-AID Screening:    Have You Ever Felt You Ought to Cut Down on Your Drinking or Drug Use?: No Have People Annoyed You By [employer] Your Drinking Or Drug Use?: No Have You Felt Bad Or Guilty About Your Drinking Or Drug Use?: No Have You Ever Had a Drink or Used Drugs First Thing In The Morning to Steady Your Nerves or to Get Rid of a Hangover?: No CAGE-AID Score: 0  Substance Abuse Education Offered: No

## 2021-03-17 NOTE — Care Management Important Message (Signed)
Important Message  Patient Details  Name: Brianna Porter MRN: [id number] Date of Birth: October 02, 1960   Medicare Important Message Given:  Yes     Memory Argue 03/17/2021, 5:37 PM

## 2021-03-17 NOTE — Progress Notes (Signed)
Physical Therapy Treatment Patient Details Name: Brianna Porter MRN: [id number] DOB: 11-Apr-1961 Today's Date: 03/17/2021    History of Present Illness 60 year old female who [facility] department [facility]/26 with complaints of right hip pain s/p a fall. She was using her cane and had a brief episode of mild lightheadedness causing her to stumble and fall. X-rays confirmed a mildly displaced right femoral neck fracture, and nowpt is s/p INTRAMEDULLARY (IM) NAIL INTERTROCHANTRIC (Right) 7/27. Pt with past medical history of hypothyroidism, paroxysmal atrial fibrillation, systolic and diastolic congestive heart failure (Echo 02/2020 EF 45-50% with G2DD), coronary artery disease (coronary CT 04/2019 with mild nonobstructive CAD), hypertension, chronic kidney disease stage IIIb, depression, generalized anxiety disorder and diabetes mellitus type 2    PT Comments    Pt was assisted to try several stair options, and was able to get up the portable step with min assist.  Higher regular stairs per pt are not her usual ones, and states the lower height is the appropriate level.  Reported to MD, and will need to have 24/7 assistance at home for safety.  Pt feels she has sufficient support, although PT had encouraged her to consider rehab short stay.  Follow along as her stay permits to work on mobility and safety awareness.   Follow Up Recommendations  SNF;Supervision for mobility/OOB     Equipment Recommendations  Rolling walker with 5" wheels    Recommendations for Other Services       Precautions / Restrictions Precautions Precautions: Fall Precaution Comments: anxious, tearful Restrictions Weight Bearing Restrictions: Yes RLE Weight Bearing: Weight bearing as tolerated    Mobility  Bed Mobility Overal bed mobility: Needs Assistance             General bed mobility comments: up in chair when PT arrived    Transfers Overall transfer level: Needs  assistance Equipment used: Rolling walker (2 wheeled)   Sit to Stand: Min assist Stand pivot transfers: Min assist       General transfer comment: pt tried to stand to [facility] without walker impulsively and was very unsafe  Ambulation/Gait Ambulation/Gait assistance: Min guard Gait Distance (Feet): 12 Feet Assistive device: Rolling walker (2 wheeled);1 person hand held assist Gait Pattern/deviations: Step-through pattern;Step-to pattern;Wide base of support Gait velocity: reduced       Stairs Stairs: Yes Stairs assistance: Mod assist;Min assist Stair Management: Step to pattern;Forwards;Backwards;One rail Right;With walker Number of Stairs: 4 General stair comments: attempted stairs with higher surface, which pt could not stand up to and tried to sit down on the floor immediately.  Used portable low step with pt getting up mn assist on RW, reported to MD   Wheelchair Mobility    Modified Rankin (Stroke Patients Only)       Balance Overall balance assessment: Needs assistance;History of Falls Sitting-balance support: Feet supported Sitting balance-Leahy Scale: Fair     Standing balance support: Bilateral upper extremity supported;During functional activity Standing balance-Leahy Scale: Poor                              Cognition Arousal/Alertness: Awake/alert Behavior During Therapy: WFL for tasks assessed/performed Overall Cognitive Status: Within Functional Limits for tasks assessed                                        Exercises General  Exercises - Lower Extremity Ankle Circles/Pumps: AROM;5 reps Quad Sets: AROM;10 reps Gluteal Sets: AROM;10 reps    General Comments General comments (skin integrity, edema, etc.): pt was nervous about the first stair attempt and then used portable step with her assertion that it was as high as she had at home      Pertinent Vitals/Pain Pain Assessment: Faces Faces Pain Scale: Hurts a little  bit Pain Location: RLE Pain Descriptors / Indicators: Operative site guarding Pain Intervention(s): Monitored during session;Repositioned;Premedicated before session    Home Living                      Prior Function            PT Goals (current goals can now be found in the care plan section) Acute Rehab PT Goals Patient Stated Goal: to go right home Progress towards PT goals: Progressing toward goals    Frequency    Min 5X/week      PT Plan Current plan remains appropriate    Co-evaluation              AM-PAC PT "6 Clicks" Mobility   Outcome Measure  Help needed turning from your back to your side while in a flat bed without using bedrails?: A Little Help needed moving from lying on your back to sitting on the side of a flat bed without using bedrails?: A Little Help needed moving to and from a bed to a chair (including a wheelchair)?: A Little Help needed standing up from a chair using your arms (e.g., wheelchair or bedside chair)?: A Little Help needed to walk in hospital room?: A Little Help needed climbing 3-5 steps with a railing? : A Lot 6 Click Score: 17    End of Session Equipment Utilized During Treatment: Gait belt Activity Tolerance: Patient limited by fatigue;Patient limited by pain;Treatment limited secondary to medical complications (Comment) Patient left: in chair;with call bell/phone within reach;with chair alarm set Nurse Communication: Mobility status PT Visit Diagnosis: Unsteadiness on feet (R26.81);Muscle weakness (generalized) (M62.81);Difficulty in walking, not elsewhere classified (R26.2);Pain Pain - Right/Left: Right Pain - part of body: Hip     Time: [id number] PT Time Calculation (min) (ACUTE ONLY): 38 min  Charges:  $Gait Training: 8-22 mins $Therapeutic Exercise: 8-22 mins $Therapeutic Activity: 8-22 mins                 Ramond Dial 03/17/2021, 1:50 PM  Mee Hives, PT MS Acute Rehab Dept. Number: [place]  and [place]

## 2021-03-17 NOTE — TOC Initial Note (Signed)
Transition of Care [facility]) - Initial/Assessment Note    Patient Details  Name: Brianna Porter MRN: [id number] Date of Birth: [birth year]  Transition of Care [facility]) CM/SW Contact:    Emeterio Reeve, LCSW Phone Number: 03/17/2021, 11:30 AM  Clinical Narrative:                  CSW received SNF consult. CSW met with pt at bedside. CSW introduced self and explained role at the hospital. Pt reports that PTA she lives at home with her daughter and grandson. PT reports she has a walker and wheelchair at home.   CSW reviewed PT/OT recommendations for SNF. Pt reports she is declining SNF and prefers to go home. Pts daughter can provide 24 hour support. Pt is open to HHPT. [facility] past and is fine with either agency. CSW confirmed that address and phone number is correct in epic. Pt has a DC order in. Pt reports her sister should be able to pick her up this afternoon. Pt stated she will call CSW if her sister cannot pick her up.  CSW informed RNCM.    Expected Discharge Plan: Skilled Nursing Facility Barriers to Discharge: No Barriers Identified   Patient Goals and CMS Choice Patient states their goals for this hospitalization and ongoing recovery are:: To return home CMS Medicare.gov Compare Post Acute Care list provided to:: Patient Choice offered to / list presented to : Patient  Expected Discharge Plan and Services Expected Discharge Plan: [place]       Living arrangements for the past 2 months: Single Family Home Expected Discharge Date: 03/17/21                                    Prior Living Arrangements/Services Living arrangements for the past 2 months: Single Family Home Lives with:: Adult Children Patient language and need for interpreter reviewed:: Yes        Need for Family Participation in Patient Care: Yes (Comment) Care giver support system in place?: Yes (comment) Current home services: DME Criminal  Activity/Legal Involvement Pertinent to Current Situation/Hospitalization: No - Comment as needed  Activities of Daily Living      Permission Sought/Granted Permission sought to share information with : Case Manager Permission granted to share information with : Yes, Verbal Permission Granted     Permission granted to share info w AGENCY: HH agencies        Emotional Assessment Appearance:: Appears stated age Attitude/Demeanor/Rapport: Engaged Affect (typically observed): Appropriate Orientation: : Oriented to Self, Oriented to Place, Oriented to  Time, Oriented to Situation Alcohol / Substance Use: Not Applicable Psych Involvement: No (comment)  Admission diagnosis:  Closed right hip fracture ([place]) [S72.001A] Closed fracture of right hip, initial encounter ([place]) [S72.001A] Patient Active Problem List   Diagnosis Date Noted   Closed right hip fracture ([place]) 03/14/2021   Generalized anxiety disorder 03/14/2021   Mixed diabetic hyperlipidemia associated with type 2 diabetes mellitus ([place]) 03/14/2021   Leukocytosis 03/14/2021   Prolonged QT interval    AF (paroxysmal atrial fibrillation) (HCC)    Hypomagnesemia    Hypertensive heart and chronic kidney disease with systolic congestive heart failure (HCC)    Hyperkalemia    Hypercholesteremia    Falls    Edema    Chronic combined systolic (congestive) and diastolic (congestive) heart failure (HCC)    Atrial  flutter ([place])    Anxiety and depression    Vitamin D deficiency 04/15/2020   Secondary adrenal insufficiency ([place]) 04/15/2020   Hypothyroidism 04/15/2020   Convulsions ([place]) 03/06/2020   Encephalopathy 03/05/2020   Fall 12/23/2019   Gastrointestinal hemorrhage    Pressure injury of skin 07/09/2019   Acute gastric ulcer with hemorrhage    Symptomatic anemia 06/28/2019   Elevated liver function tests    NICM (nonischemic cardiomyopathy) (HCC)    Mild CAD    Confusion    Diabetes ([place])    Iron deficiency anemia     Hyperthyroidism    Altered mental status 15/94/5859   Acute systolic heart failure ([place]) 05/07/2019   AKI (acute kidney injury) ([place]) 05/07/2019   Hyponatremia    CAD (coronary artery disease)    Essential hypertension    Hypokalemia    Persistent atrial fibrillation (HCC)    Hyperlipidemia 04/23/2019   Anemia 04/23/2019   Chronic kidney disease, stage 3b ([place]) 04/23/2019   Depression 03/02/2019   Osteoarthritis 03/02/2019   Type 2 diabetes mellitus with stage 3b chronic kidney disease, without long-term current use of insulin ([place]) 03/02/2019   Hypertensive heart disease 03/02/2019   PCP:  Cher Nakai, MD Pharmacy:   [facility], [place] [address] [place] [address] Phone: [phone] Fax: [phone]  [id number] [place], [place] - [address] [place] [place] [address]Phone: [phone]: [phone]    Social Determinants of Health (SDOH) Interventions    Readmission Risk Interventions Readmission Risk Prevention Plan 07/13/2019  Transportation Screening Complete  PCP or Specialist Appt within 3-5 Days Complete  HRI or [place]  Social Work Consult for R[place]anning/Counseling Complete  Palliative Care Screening Not Applicable  Medication Review (Press photographer Complete  Some recent data might be hidden   MEmeterio Reeve LLatanya Presser LMoroniSocial Worker [phone]

## 2021-03-17 NOTE — Progress Notes (Signed)
Patient discharged to home with instructions. 

## 2021-03-17 NOTE — TOC Transition Note (Signed)
Transition of Care [facility]) - CM/SW Discharge Note   Patient Details  Name: Brianna Porter MRN: [id number] Date of Birth: [birth year]  Transition of Care [facility]) CM/SW Contact:  Brianna Roys, RN Phone Number: 03/17/2021, 3:02 PM   Clinical Narrative:  Patient previously declined skilled nursing facility and wants to return home. Patient states her daughter [name] can provide 24/7 supervision. Patient states she has durable medical equipment (DME) cane, rolling walker, wheelchair, shower chair and bedside commode in the home. No DME is needed at this time. Patient did not have a preference for home health services. [facility] was not able to accept the patient due to staffing. Case Manager called [facility] and they can accept the patient with start of care next week. Patient is aware of start of care next week and is agreeable. Patient states that her sister will provide transportation home. No further needs identified from Case Manager at this time.   Final next level of care: [place] Barriers to Discharge: No Barriers Identified   Patient Goals and CMS Choice Patient states their goals for this hospitalization and ongoing recovery are:: to reteun home. CMS Medicare.gov Compare Post Acute Care list provided to:: Patient Choice offered to / list presented to : Patient   Discharge Plan and Services In-house Referral: Clinical Social Work Discharge Planning Services: CM Consult Post Acute Care Choice: Home Health          DME Arranged: N/A DME Agency: NA       HH Arranged: PT, Nurse's Aide [place] Agency: [place] Date [facility] Agency Contacted: 03/17/21 Time HH Agency Contacted: 1500 Representative spoke with at [place]: [place]  Readmission Risk Interventions Readmission Risk Prevention Plan 07/13/2019  Transportation Screening Complete  PCP or Specialist Appt within 3-5 Days Complete  HRI or [place] Complete  Social Work Consult for  [place] Planning/Counseling Complete  Palliative Care Screening Not Applicable  Medication Review Press photographer) Complete  Some recent data might be hidden

## 2021-03-17 NOTE — Discharge Summary (Signed)
Physician Discharge Summary  Brianna Porter [id number] DOB: [birth year] DOA: 03/14/2021  PCP: Cher Nakai, MD  Admit date: 03/14/2021 Discharge date: 03/17/2021  Admitted From: Home Disposition:  Home  Recommendations for Outpatient Follow-up:  Follow up with PCP in 1-2 weeks Please obtain BMP/CBC in one week   Home Health:Yes Equipment/Devices: Rolling walker and cane  Discharge Condition:Stable CODE STATUS:Full Diet recommendation: Heart Healthy   Brief/Interim Summary: 60 y.o. female past medical history significant for paroxysmal atrial fibrillation on Eliquis, hypothyroidism, chronic systolic and diastolic heart failure with an EF of 45%, kidney disease stage IIIb diabetes mellitus type 2 comes in to the ED complaining of hip pain after she had a mechanical fall on her right side  Discharge Diagnoses:  Principal Problem:   Closed right hip fracture ([place]) Active Problems:   Type 2 diabetes mellitus with stage 3b chronic kidney disease, without long-term current use of insulin (HCC)   Chronic kidney disease, stage 3b (HCC)   Hypothyroidism   AF (paroxysmal atrial fibrillation) (HCC)   Chronic combined systolic (congestive) and diastolic (congestive) heart failure ([place])   Generalized anxiety disorder   Mixed diabetic hyperlipidemia associated with type 2 diabetes mellitus ([place])   Leukocytosis  Closed right hip fracture: Orthopedic surgery was consulted and performed intramedullary nailing on 03/15/2021. Analgesics and anticoagulation per orthopedic. She will continue to work with physical therapy at home.  Mild hyperkalemia: Arrival 5.7 ACE inhibitor and potassium supplementation was held she was given [facility] and her hyperkalemia resolved.  Hypovolemic hyponatremia: Resolved with IV fluid hydration.  Metabolic acidosis: He was started on bicarbonate tablets it resolved.  Diabetes mellitus type 2 with chronic kidney disease stage IIIb: No change made to her  medication.  Acute blood loss anemia: Likely due to surgical intervention she status post 2 units of packed red blood cells her hemoglobin improved 8.9.  Chronic kidney disease stage IIIb: Creatinine baseline no changes made.  Hypothyroidism: Continue Synthroid.  Paroxysmal atrial fibrillation: She will resume her Eliquis as an outpatient rate control.  Chronic combined systolic and diastolic heart failure: No change made to her medication.  Anxiety disorder: No change made to her medication.  Leukocytosis: Likely stress margination in the setting of fracture she remained afebrile.  Discharge Instructions  Discharge Instructions     Diet - low sodium heart healthy   Complete by: As directed    Increase activity slowly   Complete by: As directed    No wound care   Complete by: As directed       Allergies as of 03/17/2021       Reactions   Ambien [zolpidem Tartrate]    Pt and family state this med makes the patient sleepwalk.   Mobic [meloxicam] Nausea Only   GI pain   Adhesive [tape]    Tears skin, please use paper tape   Codeine Nausea And Vomiting        Medication List     STOP taking these medications    sulfamethoxazole-trimethoprim 800-160 MG tablet Commonly known as: BACTRIM DS   traMADol 50 MG tablet Commonly known as: ULTRAM       TAKE these medications    acetaminophen 325 MG tablet Commonly known as: TYLENOL Take 650 mg by mouth every 6 (six) hours as needed for mild pain.   ALPRAZolam 0.5 MG tablet Commonly known as: XANAX Take 1 tablet (0.5 mg total) by mouth 3 (three) times daily as needed for anxiety or sleep.   amLODipine 10 MG  tablet Commonly known as: NORVASC Take 10 mg by mouth daily.   apixaban 5 MG Tabs tablet Commonly known as: ELIQUIS Take 1 tablet (5 mg total) by mouth 2 (two) times daily. What changed: when to take this   buPROPion 300 MG 24 hr tablet Commonly known as: WELLBUTRIN XL Take 300 mg by mouth  daily.   cetirizine 10 MG tablet Commonly known as: ZYRTEC Take 10 mg by mouth daily as needed for allergies.   cholecalciferol 25 MCG (1000 UNIT) tablet Commonly known as: VITAMIN D3 Take 1,000 Units by mouth daily.   DULoxetine 60 MG capsule Commonly known as: CYMBALTA Take 1 capsule (60 mg total) by mouth 2 (two) times daily.   fenofibrate 48 MG tablet Commonly known as: TRICOR Take 48 mg by mouth daily.   ferrous sulfate 325 (65 FE) MG tablet Take 325 mg by mouth daily.   furosemide 40 MG tablet Commonly known as: LASIX Take 1 tablet by mouth twice daily   HYDROcodone-acetaminophen 5-325 MG tablet Commonly known as: NORCO/VICODIN Take 1 tablet by mouth every 6 (six) hours as needed for moderate pain.   hydrocortisone 5 MG tablet Commonly known as: CORTEF Take 1 tablet (5 mg total) by mouth as directed. 3 tabs with Breakfast and 1 tab between 2-4 pm What changed: when to take this   ICY HOT ADVANCED RELIEF EX Apply 1 application topically 3 (three) times daily as needed (pain).   irbesartan 150 MG tablet Commonly known as: AVAPRO Take 1 tablet (150 mg total) by mouth daily.   levETIRAcetam 500 MG tablet Commonly known as: KEPPRA Take 500 mg by mouth 2 (two) times daily.   levothyroxine 125 MCG tablet Commonly known as: SYNTHROID Take 125 mcg by mouth daily.   meclizine 12.5 MG tablet Commonly known as: ANTIVERT Take 1 tablet (12.5 mg total) by mouth 3 (three) times daily as needed for dizziness.   methocarbamol 500 MG tablet Commonly known as: ROBAXIN Take 1 tablet (500 mg total) by mouth every 6 (six) hours as needed for muscle spasms.   metoprolol succinate 100 MG 24 hr tablet Commonly known as: TOPROL-XL Take 1 tablet (100 mg total) by mouth daily. Take with or immediately following a meal.   pantoprazole 40 MG tablet Commonly known as: PROTONIX Take 40 mg by mouth 2 (two) times daily.   potassium chloride SA 20 MEQ tablet Commonly known as:  KLOR-CON Take 1 tablet (20 mEq total) by mouth 2 (two) times daily. What changed: when to take this   pregabalin 75 MG capsule Commonly known as: LYRICA Take 75 mg by mouth 2 (two) times daily.   promethazine 25 MG tablet Commonly known as: PHENERGAN Take 25 mg by mouth every 6 (six) hours as needed for nausea or vomiting.   rosuvastatin 20 MG tablet Commonly known as: CRESTOR Take 1 tablet (20 mg total) by mouth once a week.   vitamin C 1000 MG tablet Take 1,000 mg by mouth daily.   Vitamin D (Ergocalciferol) 1.25 MG (50000 UNIT) Caps capsule Commonly known as: DRISDOL TAKE 1 CAPSULE BY MOUTH ONE TIME PER WEEK What changed: See the new instructions.        Follow-up Information     Haddix, Thomasene Lot, MD. Schedule an appointment as soon as possible for a visit in 2 week(s).   Specialty: Orthopedic Surgery Why: for repeat x-rays right hip Contact information: [place] [address] [phone]  Allergies  Allergen Reactions   Ambien [Zolpidem Tartrate]     Pt and family state this med makes the patient sleepwalk.   Mobic [Meloxicam] Nausea Only    GI pain   Adhesive [Tape]     Tears skin, please use paper tape   Codeine Nausea And Vomiting    Consultations: Orthopedic surgery   Procedures/Studies: DG Chest 1 View  Result Date: 03/14/2021 CLINICAL DATA:  Status post fall. EXAM: CHEST  1 VIEW COMPARISON:  November 13, 2020 FINDINGS: Very mild, stable linear scarring and/or atelectasis is seen within the upper right lung. There is no evidence of acute infiltrate, pleural effusion or pneumothorax. The heart size and mediastinal contours are within normal limits. The visualized skeletal structures are unremarkable. IMPRESSION: Very mild right upper lobe linear scarring and/or atelectasis. Electronically Signed   By: Virgina Norfolk M.D.   On: 03/14/2021 19:51   DG C-Arm 1-60 Min  Result Date: 03/15/2021 CLINICAL DATA:  Surgery,  elective Z41.9 (ICD-10-CM). Additional history provided by technologist: ORIF of right hip fracture. Provided fluoroscopy time 2 minutes, 4 seconds (14.52 mGy). EXAM: RIGHT FEMUR 2 VIEWS; DG C-ARM 1-60 MIN COMPARISON:  Radiographs of the right hip 03/14/2021. FINDINGS: Eight intraoperative fluoroscopic images of the right hip/femur are submitted. On the provided images, there are findings of interval placement of an intramedullary nail within the right femur. There are 2 proximal interlocking screws traversing the femoral head and neck. A single distal interlocking screw is also noted. Hardware traverses a known comminuted proximal right femur fracture. Persistent although improved displacement at the fracture sites. IMPRESSION: Eight intraoperative fluoroscopic images from right femoral intramedullary nail placement for a proximal right femoral fracture, as described. Electronically Signed   By: Kellie Simmering DO   On: 03/15/2021 11:51   DG Hip Unilat With Pelvis 2-3 Views Right  Result Date: 03/14/2021 CLINICAL DATA:  60 year old female with fall and right hip pain. EXAM: DG HIP (WITH OR WITHOUT PELVIS) 2-3V RIGHT COMPARISON:  None. FINDINGS: There is a comminuted mildly displaced fracture of the right femoral neck involving the intertrochanteric and subtrochanteric region. No dislocation. The bones are osteopenic. The soft tissues are grossly unremarkable. IMPRESSION: Mildly displaced comminuted fracture of the right femoral neck. No dislocation. Electronically Signed   By: Anner Crete M.D.   On: 03/14/2021 19:49   DG FEMUR, MIN 2 VIEWS RIGHT  Result Date: 03/15/2021 CLINICAL DATA:  Surgery, elective Z41.9 (ICD-10-CM). Additional history provided by technologist: ORIF of right hip fracture. Provided fluoroscopy time 2 minutes, 4 seconds (14.52 mGy). EXAM: RIGHT FEMUR 2 VIEWS; DG C-ARM 1-60 MIN COMPARISON:  Radiographs of the right hip 03/14/2021. FINDINGS: Eight intraoperative fluoroscopic images of  the right hip/femur are submitted. On the provided images, there are findings of interval placement of an intramedullary nail within the right femur. There are 2 proximal interlocking screws traversing the femoral head and neck. A single distal interlocking screw is also noted. Hardware traverses a known comminuted proximal right femur fracture. Persistent although improved displacement at the fracture sites. IMPRESSION: Eight intraoperative fluoroscopic images from right femoral intramedullary nail placement for a proximal right femoral fracture, as described. Electronically Signed   By: Kellie Simmering DO   On: 03/15/2021 11:51   DG FEMUR PORT, MIN 2 VIEWS RIGHT  Result Date: 03/15/2021 CLINICAL DATA:  Right femur/hip repair for fracture. EXAM: RIGHT FEMUR PORTABLE 2 VIEW COMPARISON:  Intraoperative radiographs 03/15/2021. Right hip radiographs 03/14/2021. FINDINGS: Intramedullary rod present in the femur with a  single distal interlocking screw. Two proximal dynamic hip screws extend through the femoral neck. Fracture is reduced. Fracture fragments noted medial to the hip. IMPRESSION: ORIF of the right femur without radiographic evidence for complication. Electronically Signed   By: San Morelle M.D.   On: 03/15/2021 15:27     Subjective: No complaints feels great.  Discharge Exam: Vitals:   03/17/21 0416 03/17/21 0818  BP: (!) 168/57 (!) 145/65  Pulse: 67 73  Resp: 18 17  Temp: 97.9 F (36.6 C) 97.6 F (36.4 C)  SpO2: 100% 99%   Vitals:   03/16/21 1659 03/16/21 2053 03/17/21 0416 03/17/21 0818  BP: (!) 139/56 136/68 (!) 168/57 (!) 145/65  Pulse: 71 75 67 73  Resp: '18 17 18 17  '$ Temp: 98.3 F (36.8 C) 98.1 F (36.7 C) 97.9 F (36.6 C) 97.6 F (36.4 C)  TempSrc: Oral Oral Oral Oral  SpO2: 100% 97% 100% 99%  Weight:      Height:        General: Pt is alert, awake, not in acute distress Cardiovascular: RRR, S1/S2 +, no rubs, no gallops Respiratory: CTA bilaterally, no  wheezing, no rhonchi Abdominal: Soft, NT, ND, bowel sounds + Extremities: no edema, no cyanosis    The results of significant diagnostics from this hospitalization (including imaging, microbiology, ancillary and laboratory) are listed below for reference.     Microbiology: Recent Results (from the past 240 hour(s))  Resp Panel by RT-PCR (Flu A&B, Covid) Nasopharyngeal Swab     Status: None   Collection Time: 03/14/21  7:33 PM   Specimen: Nasopharyngeal Swab; Nasopharyngeal(NP) swabs in vial transport medium  Result Value Ref Range Status   SARS Coronavirus 2 by RT PCR NEGATIVE NEGATIVE Final    Comment: (NOTE) SARS-CoV-2 target nucleic acids are NOT DETECTED.  The SARS-CoV-2 RNA is generally detectable in upper respiratory specimens during the acute phase of infection. The lowest concentration of SARS-CoV-2 viral copies this assay can detect is 138 copies/mL. A negative result does not preclude SARS-Cov-2 infection and should not be used as the sole basis for treatment or other patient management decisions. A negative result may occur with  improper specimen collection/handling, submission of specimen other than nasopharyngeal swab, presence of viral mutation(s) within the areas targeted by this assay, and inadequate number of viral copies(<138 copies/mL). A negative result must be combined with clinical observations, patient history, and epidemiological information. The expected result is Negative.  Fact Sheet for Patients:  EntrepreneurPulse.com.au  Fact Sheet for Healthcare Providers:  IncredibleEmployment.be  This test is no t yet approved or cleared by the [place] FDA and  has been authorized for detection and/or diagnosis of SARS-CoV-2 by FDA under an Emergency Use Authorization (EUA). This EUA will remain  in effect (meaning this test can be used) for the duration of the COVID-19 declaration under Section 564(b)(1) of the Act,  21 U.S.C.section 360bbb-3(b)(1), unless the authorization is terminated  or revoked sooner.       Influenza A by PCR NEGATIVE NEGATIVE Final   Influenza B by PCR NEGATIVE NEGATIVE Final    Comment: (NOTE) The Xpert Xpress SARS-CoV-2/FLU/RSV plus assay is intended as an aid in the diagnosis of influenza from Nasopharyngeal swab specimens and should not be used as a sole basis for treatment. Nasal washings and aspirates are unacceptable for Xpert Xpress SARS-CoV-2/FLU/RSV testing.  Fact Sheet for Patients: EntrepreneurPulse.com.au  Fact Sheet for Healthcare Providers: IncredibleEmployment.be  This test is not yet approved or cleared by the  [place] Architectural technologist and has been authorized for detection and/or diagnosis of SARS-CoV-2 by FDA under an Print production planner (EUA). This EUA will remain in effect (meaning this test can be used) for the duration of the COVID-19 declaration under Section 564(b)(1) of the Act, 21 U.S.C. section 360bbb-3(b)(1), unless the authorization is terminated or revoked.  Performed at [facility], [place] [address], [place], [place] [address]   Urine Culture     Status: Abnormal   Collection Time: 03/15/21  4:20 AM   Specimen: Urine, Clean Catch  Result Value Ref Range Status   Specimen Description URINE, CLEAN CATCH  Final   Special Requests   Final    NONE Performed at [facility], [place] [address], [place], [place] [address]    Culture MULTIPLE SPECIES PRESENT, SUGGEST RECOLLECTION (A)  Final   Report Status 03/16/2021 FINAL  Final  Surgical pcr screen     Status: None   Collection Time: 03/15/21  9:51 AM   Specimen: Nasal Mucosa; Nasal Swab  Result Value Ref Range Status   MRSA, PCR NEGATIVE NEGATIVE Final   Staphylococcus aureus NEGATIVE NEGATIVE Final    Comment: (NOTE) The Xpert SA Assay (FDA approved for NASAL specimens in patients 41 years of age and older), is one component of a  comprehensive surveillance program. It is not intended to diagnose infection nor to guide or monitor treatment. Performed at [facility], [place] [address] [place], [place] [address]      Labs: BNP (last 3 results) No results for input(s): BNP in the last 8760 hours. Basic Metabolic Panel: Recent Labs  Lab 03/14/21 1812 03/15/21 0031 03/15/21 0230 03/15/21 1735 03/16/21 0039 03/17/21 0423  NA 133* 130* 133* 133* 134* 136  K 5.7* 5.2* 5.3* 5.4* 4.8 4.1  CL 107 105  --  104 104 101  CO2 17* 14*  --  21* 23 24  GLUCOSE 135* 121*  --  270* 100* 89  BUN 26* 18  --  18 17 24*  CREATININE 1.52* 1.05*  --  1.41* 1.37* 1.44*  CALCIUM 9.1 8.2*  --  8.8* 8.7* 9.1   Liver Function Tests: Recent Labs  Lab 03/14/21 1812 03/15/21 0031  AST 23 16  ALT 23 15  ALKPHOS 74 50  BILITOT 0.2* 0.1*  PROT 6.8 4.5*  ALBUMIN 3.9 2.6*   No results for input(s): LIPASE, AMYLASE in the last 168 hours. No results for input(s): AMMONIA in the last 168 hours. CBC: Recent Labs  Lab 03/14/21 1812 03/15/21 0230 03/15/21 0358 03/16/21 0039 03/16/21 0800 03/17/21 0423  WBC 17.0*  --  8.7 7.9  --  7.1  NEUTROABS 13.7*  --  6.3  --   --   --   HGB 10.0* 7.8* 9.0* 6.7* 8.6* 8.9*  HCT 33.9* 23.0* 29.2* 21.1* 25.7* 27.1*  MCV 98.5  --  95.1 93.4  --  92.5  PLT 346  --  292 251  --  279   Cardiac Enzymes: No results for input(s): CKTOTAL, CKMB, CKMBINDEX, TROPONINI in the last 168 hours. BNP: Invalid input(s): POCBNP CBG: Recent Labs  Lab 03/16/21 0818 03/16/21 1140 03/16/21 1657 03/16/21 2055 03/17/21 0738  GLUCAP 82 154* 127* 165* 75   D-Dimer No results for input(s): DDIMER in the last 72 hours. Hgb A1c No results for input(s): HGBA1C in the last 72 hours. Lipid Profile No results for input(s): CHOL, HDL, LDLCALC, TRIG, CHOLHDL, LDLDIRECT in the last 72  hours. Thyroid function studies No results for input(s): TSH, T4TOTAL, T3FREE, THYROIDAB in the last 72 hours.  Invalid  input(s): FREET3 Anemia work up No results for input(s): VITAMINB12, FOLATE, FERRITIN, TIBC, IRON, RETICCTPCT in the last 72 hours. Urinalysis    Component Value Date/Time   COLORURINE STRAW (A) 03/15/2021 0424   APPEARANCEUR CLEAR 03/15/2021 0424   LABSPEC 1.009 03/15/2021 0424   PHURINE 6.0 03/15/2021 0424   GLUCOSEU NEGATIVE 03/15/2021 0424   HGBUR NEGATIVE 03/15/2021 0424   BILIRUBINUR NEGATIVE 03/15/2021 0424   KETONESUR NEGATIVE 03/15/2021 0424   PROTEINUR NEGATIVE 03/15/2021 0424   NITRITE NEGATIVE 03/15/2021 0424   LEUKOCYTESUR NEGATIVE 03/15/2021 0424   Sepsis Labs Invalid input(s): PROCALCITONIN,  WBC,  LACTICIDVEN Microbiology Recent Results (from the past 240 hour(s))  Resp Panel by RT-PCR (Flu A&B, Covid) Nasopharyngeal Swab     Status: None   Collection Time: 03/14/21  7:33 PM   Specimen: Nasopharyngeal Swab; Nasopharyngeal(NP) swabs in vial transport medium  Result Value Ref Range Status   SARS Coronavirus 2 by RT PCR NEGATIVE NEGATIVE Final    Comment: (NOTE) SARS-CoV-2 target nucleic acids are NOT DETECTED.  The SARS-CoV-2 RNA is generally detectable in upper respiratory specimens during the acute phase of infection. The lowest concentration of SARS-CoV-2 viral copies this assay can detect is 138 copies/mL. A negative result does not preclude SARS-Cov-2 infection and should not be used as the sole basis for treatment or other patient management decisions. A negative result may occur with  improper specimen collection/handling, submission of specimen other than nasopharyngeal swab, presence of viral mutation(s) within the areas targeted by this assay, and inadequate number of viral copies(<138 copies/mL). A negative result must be combined with clinical observations, patient history, and epidemiological information. The expected result is Negative.  Fact Sheet for Patients:  EntrepreneurPulse.com.au  Fact Sheet for Healthcare Providers:   IncredibleEmployment.be  This test is no t yet approved or cleared by the [place] FDA and  has been authorized for detection and/or diagnosis of SARS-CoV-2 by FDA under an Emergency Use Authorization (EUA). This EUA will remain  in effect (meaning this test can be used) for the duration of the COVID-19 declaration under Section 564(b)(1) of the Act, 21 U.S.C.section 360bbb-3(b)(1), unless the authorization is terminated  or revoked sooner.       Influenza A by PCR NEGATIVE NEGATIVE Final   Influenza B by PCR NEGATIVE NEGATIVE Final    Comment: (NOTE) The Xpert Xpress SARS-CoV-2/FLU/RSV plus assay is intended as an aid in the diagnosis of influenza from Nasopharyngeal swab specimens and should not be used as a sole basis for treatment. Nasal washings and aspirates are unacceptable for Xpert Xpress SARS-CoV-2/FLU/RSV testing.  Fact Sheet for Patients: EntrepreneurPulse.com.au  Fact Sheet for Healthcare Providers: IncredibleEmployment.be  This test is not yet approved or cleared by the [place] FDA and has been authorized for detection and/or diagnosis of SARS-CoV-2 by FDA under an Emergency Use Authorization (EUA). This EUA will remain in effect (meaning this test can be used) for the duration of the COVID-19 declaration under Section 564(b)(1) of the Act, 21 U.S.C. section 360bbb-3(b)(1), unless the authorization is terminated or revoked.  Performed at [facility], [place] [address], [place],  [address]   Urine Culture     Status: Abnormal   Collection Time: 03/15/21  4:20 AM   Specimen: Urine, Clean Catch  Result Value Ref Range Status   Specimen Description URINE, CLEAN CATCH  Final   Special  Requests   Final    NONE Performed at [facility], [place] [address] [place], [place] [address]    Culture MULTIPLE SPECIES PRESENT, SUGGEST RECOLLECTION (A)  Final   Report Status 03/16/2021 FINAL   Final  Surgical pcr screen     Status: None   Collection Time: 03/15/21  9:51 AM   Specimen: Nasal Mucosa; Nasal Swab  Result Value Ref Range Status   MRSA, PCR NEGATIVE NEGATIVE Final   Staphylococcus aureus NEGATIVE NEGATIVE Final    Comment: (NOTE) The Xpert SA Assay (FDA approved for NASAL specimens in patients 77 years of age and older), is one component of a comprehensive surveillance program. It is not intended to diagnose infection nor to guide or monitor treatment. Performed at [facility], [place] [address] [place], [place] [address]      Time coordinating discharge: Over 30 minutes  SIGNED:   Charlynne Cousins, MD  [facility] 03/17/2021, 8:33 AM Pager   If 7PM-7AM, please contact night-coverage [email or web address] Password [email or web address]

## 2021-03-21 DIAGNOSIS — D509 Iron deficiency anemia, unspecified: Secondary | ICD-10-CM | POA: Diagnosis not present

## 2021-03-21 DIAGNOSIS — D62 Acute posthemorrhagic anemia: Secondary | ICD-10-CM | POA: Diagnosis not present

## 2021-03-21 DIAGNOSIS — I428 Other cardiomyopathies: Secondary | ICD-10-CM | POA: Diagnosis not present

## 2021-03-21 DIAGNOSIS — N1832 Chronic kidney disease, stage 3b: Secondary | ICD-10-CM | POA: Diagnosis not present

## 2021-03-21 DIAGNOSIS — I480 Paroxysmal atrial fibrillation: Secondary | ICD-10-CM | POA: Diagnosis not present

## 2021-03-21 DIAGNOSIS — F32A Depression, unspecified: Secondary | ICD-10-CM | POA: Diagnosis not present

## 2021-03-21 DIAGNOSIS — E782 Mixed hyperlipidemia: Secondary | ICD-10-CM | POA: Diagnosis not present

## 2021-03-21 DIAGNOSIS — Z9181 History of falling: Secondary | ICD-10-CM | POA: Diagnosis not present

## 2021-03-21 DIAGNOSIS — M19041 Primary osteoarthritis, right hand: Secondary | ICD-10-CM | POA: Diagnosis not present

## 2021-03-21 DIAGNOSIS — S7221XD Displaced subtrochanteric fracture of right femur, subsequent encounter for closed fracture with routine healing: Secondary | ICD-10-CM | POA: Diagnosis not present

## 2021-03-21 DIAGNOSIS — I130 Hypertensive heart and chronic kidney disease with heart failure and stage 1 through stage 4 chronic kidney disease, or unspecified chronic kidney disease: Secondary | ICD-10-CM | POA: Diagnosis not present

## 2021-03-21 DIAGNOSIS — S72091D Other fracture of head and neck of right femur, subsequent encounter for closed fracture with routine healing: Secondary | ICD-10-CM | POA: Diagnosis not present

## 2021-03-21 DIAGNOSIS — M19042 Primary osteoarthritis, left hand: Secondary | ICD-10-CM | POA: Diagnosis not present

## 2021-03-21 DIAGNOSIS — E1122 Type 2 diabetes mellitus with diabetic chronic kidney disease: Secondary | ICD-10-CM | POA: Diagnosis not present

## 2021-03-21 DIAGNOSIS — Z7901 Long term (current) use of anticoagulants: Secondary | ICD-10-CM | POA: Diagnosis not present

## 2021-03-21 DIAGNOSIS — S72141D Displaced intertrochanteric fracture of right femur, subsequent encounter for closed fracture with routine healing: Secondary | ICD-10-CM | POA: Diagnosis not present

## 2021-03-21 DIAGNOSIS — F411 Generalized anxiety disorder: Secondary | ICD-10-CM | POA: Diagnosis not present

## 2021-03-21 DIAGNOSIS — D631 Anemia in chronic kidney disease: Secondary | ICD-10-CM | POA: Diagnosis not present

## 2021-03-21 DIAGNOSIS — E039 Hypothyroidism, unspecified: Secondary | ICD-10-CM | POA: Diagnosis not present

## 2021-03-21 DIAGNOSIS — E1169 Type 2 diabetes mellitus with other specified complication: Secondary | ICD-10-CM | POA: Diagnosis not present

## 2021-03-21 DIAGNOSIS — I4892 Unspecified atrial flutter: Secondary | ICD-10-CM | POA: Diagnosis not present

## 2021-03-21 DIAGNOSIS — I5042 Chronic combined systolic (congestive) and diastolic (congestive) heart failure: Secondary | ICD-10-CM | POA: Diagnosis not present

## 2021-03-28 DIAGNOSIS — S72141D Displaced intertrochanteric fracture of right femur, subsequent encounter for closed fracture with routine healing: Secondary | ICD-10-CM | POA: Diagnosis not present

## 2021-03-28 DIAGNOSIS — S72141 Displaced intertrochanteric fracture of right femur: Secondary | ICD-10-CM | POA: Diagnosis not present

## 2021-04-03 DIAGNOSIS — R569 Unspecified convulsions: Secondary | ICD-10-CM | POA: Diagnosis not present

## 2021-04-03 DIAGNOSIS — M159 Polyosteoarthritis, unspecified: Secondary | ICD-10-CM | POA: Diagnosis not present

## 2021-04-03 DIAGNOSIS — F419 Anxiety disorder, unspecified: Secondary | ICD-10-CM | POA: Diagnosis not present

## 2021-04-03 DIAGNOSIS — N189 Chronic kidney disease, unspecified: Secondary | ICD-10-CM | POA: Diagnosis not present

## 2021-04-03 DIAGNOSIS — I482 Chronic atrial fibrillation, unspecified: Secondary | ICD-10-CM | POA: Diagnosis not present

## 2021-04-03 DIAGNOSIS — E669 Obesity, unspecified: Secondary | ICD-10-CM | POA: Diagnosis not present

## 2021-04-03 DIAGNOSIS — I4892 Unspecified atrial flutter: Secondary | ICD-10-CM | POA: Diagnosis not present

## 2021-04-03 DIAGNOSIS — R2681 Unsteadiness on feet: Secondary | ICD-10-CM | POA: Diagnosis not present

## 2021-04-03 DIAGNOSIS — E1169 Type 2 diabetes mellitus with other specified complication: Secondary | ICD-10-CM | POA: Diagnosis not present

## 2021-04-03 DIAGNOSIS — R7989 Other specified abnormal findings of blood chemistry: Secondary | ICD-10-CM | POA: Diagnosis not present

## 2021-04-03 DIAGNOSIS — F3341 Major depressive disorder, recurrent, in partial remission: Secondary | ICD-10-CM | POA: Diagnosis not present

## 2021-04-03 DIAGNOSIS — J309 Allergic rhinitis, unspecified: Secondary | ICD-10-CM | POA: Diagnosis not present

## 2021-04-26 DIAGNOSIS — I428 Other cardiomyopathies: Secondary | ICD-10-CM | POA: Diagnosis not present

## 2021-04-26 DIAGNOSIS — Z9181 History of falling: Secondary | ICD-10-CM | POA: Diagnosis not present

## 2021-04-26 DIAGNOSIS — D509 Iron deficiency anemia, unspecified: Secondary | ICD-10-CM | POA: Diagnosis not present

## 2021-04-26 DIAGNOSIS — I5042 Chronic combined systolic (congestive) and diastolic (congestive) heart failure: Secondary | ICD-10-CM | POA: Diagnosis not present

## 2021-04-26 DIAGNOSIS — D62 Acute posthemorrhagic anemia: Secondary | ICD-10-CM | POA: Diagnosis not present

## 2021-04-26 DIAGNOSIS — M19041 Primary osteoarthritis, right hand: Secondary | ICD-10-CM | POA: Diagnosis not present

## 2021-04-26 DIAGNOSIS — F32A Depression, unspecified: Secondary | ICD-10-CM | POA: Diagnosis not present

## 2021-04-26 DIAGNOSIS — I4892 Unspecified atrial flutter: Secondary | ICD-10-CM | POA: Diagnosis not present

## 2021-04-26 DIAGNOSIS — E1122 Type 2 diabetes mellitus with diabetic chronic kidney disease: Secondary | ICD-10-CM | POA: Diagnosis not present

## 2021-04-26 DIAGNOSIS — N1832 Chronic kidney disease, stage 3b: Secondary | ICD-10-CM | POA: Diagnosis not present

## 2021-04-26 DIAGNOSIS — Z7901 Long term (current) use of anticoagulants: Secondary | ICD-10-CM | POA: Diagnosis not present

## 2021-04-26 DIAGNOSIS — S72141D Displaced intertrochanteric fracture of right femur, subsequent encounter for closed fracture with routine healing: Secondary | ICD-10-CM | POA: Diagnosis not present

## 2021-04-26 DIAGNOSIS — I480 Paroxysmal atrial fibrillation: Secondary | ICD-10-CM | POA: Diagnosis not present

## 2021-04-26 DIAGNOSIS — S7221XD Displaced subtrochanteric fracture of right femur, subsequent encounter for closed fracture with routine healing: Secondary | ICD-10-CM | POA: Diagnosis not present

## 2021-04-26 DIAGNOSIS — E782 Mixed hyperlipidemia: Secondary | ICD-10-CM | POA: Diagnosis not present

## 2021-04-26 DIAGNOSIS — I130 Hypertensive heart and chronic kidney disease with heart failure and stage 1 through stage 4 chronic kidney disease, or unspecified chronic kidney disease: Secondary | ICD-10-CM | POA: Diagnosis not present

## 2021-04-26 DIAGNOSIS — E039 Hypothyroidism, unspecified: Secondary | ICD-10-CM | POA: Diagnosis not present

## 2021-04-26 DIAGNOSIS — D631 Anemia in chronic kidney disease: Secondary | ICD-10-CM | POA: Diagnosis not present

## 2021-04-26 DIAGNOSIS — S72091D Other fracture of head and neck of right femur, subsequent encounter for closed fracture with routine healing: Secondary | ICD-10-CM | POA: Diagnosis not present

## 2021-04-26 DIAGNOSIS — M19042 Primary osteoarthritis, left hand: Secondary | ICD-10-CM | POA: Diagnosis not present

## 2021-04-26 DIAGNOSIS — F411 Generalized anxiety disorder: Secondary | ICD-10-CM | POA: Diagnosis not present

## 2021-04-26 DIAGNOSIS — E1169 Type 2 diabetes mellitus with other specified complication: Secondary | ICD-10-CM | POA: Diagnosis not present

## 2021-05-01 ENCOUNTER — Other Ambulatory Visit: Payer: Self-pay | Admitting: Internal Medicine

## 2021-05-01 DIAGNOSIS — R569 Unspecified convulsions: Secondary | ICD-10-CM | POA: Diagnosis not present

## 2021-05-01 DIAGNOSIS — E1169 Type 2 diabetes mellitus with other specified complication: Secondary | ICD-10-CM | POA: Diagnosis not present

## 2021-05-01 DIAGNOSIS — F3341 Major depressive disorder, recurrent, in partial remission: Secondary | ICD-10-CM | POA: Diagnosis not present

## 2021-05-01 DIAGNOSIS — M159 Polyosteoarthritis, unspecified: Secondary | ICD-10-CM | POA: Diagnosis not present

## 2021-05-01 DIAGNOSIS — R7989 Other specified abnormal findings of blood chemistry: Secondary | ICD-10-CM | POA: Diagnosis not present

## 2021-05-01 DIAGNOSIS — Z6825 Body mass index (BMI) 25.0-25.9, adult: Secondary | ICD-10-CM | POA: Diagnosis not present

## 2021-05-01 DIAGNOSIS — F419 Anxiety disorder, unspecified: Secondary | ICD-10-CM | POA: Diagnosis not present

## 2021-05-01 DIAGNOSIS — I4892 Unspecified atrial flutter: Secondary | ICD-10-CM | POA: Diagnosis not present

## 2021-05-02 ENCOUNTER — Other Ambulatory Visit: Payer: Self-pay | Admitting: Cardiology

## 2021-05-02 DIAGNOSIS — S72141D Displaced intertrochanteric fracture of right femur, subsequent encounter for closed fracture with routine healing: Secondary | ICD-10-CM | POA: Diagnosis not present

## 2021-05-02 DIAGNOSIS — M25512 Pain in left shoulder: Secondary | ICD-10-CM | POA: Diagnosis not present

## 2021-05-03 ENCOUNTER — Other Ambulatory Visit: Payer: Self-pay | Admitting: Internal Medicine

## 2021-05-03 NOTE — Telephone Encounter (Signed)
Rosuvastatin 20 mg # 12 only with message patient needs appointment and labs for further refills / 1st attempt  sent to [id number] [place], [place] - [address]

## 2021-05-03 NOTE — Telephone Encounter (Signed)
Per Dr. Kelton Pillar we can send for 3 month supply no refill. Patient states she will call back and schedule once she heals from surgery

## 2021-05-26 DIAGNOSIS — R2681 Unsteadiness on feet: Secondary | ICD-10-CM | POA: Diagnosis not present

## 2021-05-26 DIAGNOSIS — I4892 Unspecified atrial flutter: Secondary | ICD-10-CM | POA: Diagnosis not present

## 2021-05-26 DIAGNOSIS — F3341 Major depressive disorder, recurrent, in partial remission: Secondary | ICD-10-CM | POA: Diagnosis not present

## 2021-05-26 DIAGNOSIS — E669 Obesity, unspecified: Secondary | ICD-10-CM | POA: Diagnosis not present

## 2021-05-26 DIAGNOSIS — M159 Polyosteoarthritis, unspecified: Secondary | ICD-10-CM | POA: Diagnosis not present

## 2021-05-26 DIAGNOSIS — J309 Allergic rhinitis, unspecified: Secondary | ICD-10-CM | POA: Diagnosis not present

## 2021-05-26 DIAGNOSIS — R7989 Other specified abnormal findings of blood chemistry: Secondary | ICD-10-CM | POA: Diagnosis not present

## 2021-05-26 DIAGNOSIS — E1169 Type 2 diabetes mellitus with other specified complication: Secondary | ICD-10-CM | POA: Diagnosis not present

## 2021-05-26 DIAGNOSIS — R569 Unspecified convulsions: Secondary | ICD-10-CM | POA: Diagnosis not present

## 2021-05-26 DIAGNOSIS — I482 Chronic atrial fibrillation, unspecified: Secondary | ICD-10-CM | POA: Diagnosis not present

## 2021-05-26 DIAGNOSIS — F419 Anxiety disorder, unspecified: Secondary | ICD-10-CM | POA: Diagnosis not present

## 2021-05-26 DIAGNOSIS — N189 Chronic kidney disease, unspecified: Secondary | ICD-10-CM | POA: Diagnosis not present

## 2021-05-31 DIAGNOSIS — N189 Chronic kidney disease, unspecified: Secondary | ICD-10-CM | POA: Diagnosis not present

## 2021-05-31 DIAGNOSIS — Z23 Encounter for immunization: Secondary | ICD-10-CM | POA: Diagnosis not present

## 2021-05-31 DIAGNOSIS — R9431 Abnormal electrocardiogram [ECG] [EKG]: Secondary | ICD-10-CM | POA: Diagnosis not present

## 2021-05-31 DIAGNOSIS — I5022 Chronic systolic (congestive) heart failure: Secondary | ICD-10-CM | POA: Diagnosis not present

## 2021-05-31 DIAGNOSIS — I4892 Unspecified atrial flutter: Secondary | ICD-10-CM | POA: Diagnosis not present

## 2021-05-31 DIAGNOSIS — R7989 Other specified abnormal findings of blood chemistry: Secondary | ICD-10-CM | POA: Diagnosis not present

## 2021-05-31 DIAGNOSIS — R2681 Unsteadiness on feet: Secondary | ICD-10-CM | POA: Diagnosis not present

## 2021-05-31 DIAGNOSIS — M25512 Pain in left shoulder: Secondary | ICD-10-CM | POA: Diagnosis not present

## 2021-05-31 DIAGNOSIS — J309 Allergic rhinitis, unspecified: Secondary | ICD-10-CM | POA: Diagnosis not present

## 2021-05-31 DIAGNOSIS — E875 Hyperkalemia: Secondary | ICD-10-CM | POA: Diagnosis not present

## 2021-05-31 DIAGNOSIS — I482 Chronic atrial fibrillation, unspecified: Secondary | ICD-10-CM | POA: Diagnosis not present

## 2021-05-31 DIAGNOSIS — R5382 Chronic fatigue, unspecified: Secondary | ICD-10-CM | POA: Diagnosis not present

## 2021-06-07 DIAGNOSIS — N189 Chronic kidney disease, unspecified: Secondary | ICD-10-CM | POA: Diagnosis not present

## 2021-06-07 DIAGNOSIS — K922 Gastrointestinal hemorrhage, unspecified: Secondary | ICD-10-CM | POA: Diagnosis not present

## 2021-06-07 DIAGNOSIS — R2681 Unsteadiness on feet: Secondary | ICD-10-CM | POA: Diagnosis not present

## 2021-06-07 DIAGNOSIS — E875 Hyperkalemia: Secondary | ICD-10-CM | POA: Diagnosis not present

## 2021-06-07 DIAGNOSIS — R9431 Abnormal electrocardiogram [ECG] [EKG]: Secondary | ICD-10-CM | POA: Diagnosis not present

## 2021-06-07 DIAGNOSIS — I4892 Unspecified atrial flutter: Secondary | ICD-10-CM | POA: Diagnosis not present

## 2021-06-07 DIAGNOSIS — I5022 Chronic systolic (congestive) heart failure: Secondary | ICD-10-CM | POA: Diagnosis not present

## 2021-06-07 DIAGNOSIS — I482 Chronic atrial fibrillation, unspecified: Secondary | ICD-10-CM | POA: Diagnosis not present

## 2021-06-07 DIAGNOSIS — D692 Other nonthrombocytopenic purpura: Secondary | ICD-10-CM | POA: Diagnosis not present

## 2021-06-07 DIAGNOSIS — R7989 Other specified abnormal findings of blood chemistry: Secondary | ICD-10-CM | POA: Diagnosis not present

## 2021-06-07 DIAGNOSIS — J309 Allergic rhinitis, unspecified: Secondary | ICD-10-CM | POA: Diagnosis not present

## 2021-06-07 DIAGNOSIS — M25551 Pain in right hip: Secondary | ICD-10-CM | POA: Diagnosis not present

## 2021-06-12 DIAGNOSIS — J309 Allergic rhinitis, unspecified: Secondary | ICD-10-CM | POA: Diagnosis not present

## 2021-06-12 DIAGNOSIS — N189 Chronic kidney disease, unspecified: Secondary | ICD-10-CM | POA: Diagnosis not present

## 2021-06-12 DIAGNOSIS — M25512 Pain in left shoulder: Secondary | ICD-10-CM | POA: Diagnosis not present

## 2021-06-12 DIAGNOSIS — E7800 Pure hypercholesterolemia, unspecified: Secondary | ICD-10-CM | POA: Diagnosis not present

## 2021-06-12 DIAGNOSIS — E1169 Type 2 diabetes mellitus with other specified complication: Secondary | ICD-10-CM | POA: Diagnosis not present

## 2021-06-12 DIAGNOSIS — D509 Iron deficiency anemia, unspecified: Secondary | ICD-10-CM | POA: Diagnosis not present

## 2021-06-12 DIAGNOSIS — I482 Chronic atrial fibrillation, unspecified: Secondary | ICD-10-CM | POA: Diagnosis not present

## 2021-06-12 DIAGNOSIS — Z6825 Body mass index (BMI) 25.0-25.9, adult: Secondary | ICD-10-CM | POA: Diagnosis not present

## 2021-06-12 DIAGNOSIS — I10 Essential (primary) hypertension: Secondary | ICD-10-CM | POA: Diagnosis not present

## 2021-06-12 DIAGNOSIS — E039 Hypothyroidism, unspecified: Secondary | ICD-10-CM | POA: Diagnosis not present

## 2021-06-12 DIAGNOSIS — I5022 Chronic systolic (congestive) heart failure: Secondary | ICD-10-CM | POA: Diagnosis not present

## 2021-06-12 DIAGNOSIS — R2681 Unsteadiness on feet: Secondary | ICD-10-CM | POA: Diagnosis not present

## 2021-06-12 DIAGNOSIS — I4892 Unspecified atrial flutter: Secondary | ICD-10-CM | POA: Diagnosis not present

## 2021-06-12 DIAGNOSIS — R7989 Other specified abnormal findings of blood chemistry: Secondary | ICD-10-CM | POA: Diagnosis not present

## 2021-06-23 DIAGNOSIS — F3341 Major depressive disorder, recurrent, in partial remission: Secondary | ICD-10-CM | POA: Diagnosis not present

## 2021-06-23 DIAGNOSIS — F419 Anxiety disorder, unspecified: Secondary | ICD-10-CM | POA: Diagnosis not present

## 2021-06-23 DIAGNOSIS — M159 Polyosteoarthritis, unspecified: Secondary | ICD-10-CM | POA: Diagnosis not present

## 2021-06-23 DIAGNOSIS — E669 Obesity, unspecified: Secondary | ICD-10-CM | POA: Diagnosis not present

## 2021-06-23 DIAGNOSIS — E1169 Type 2 diabetes mellitus with other specified complication: Secondary | ICD-10-CM | POA: Diagnosis not present

## 2021-06-23 DIAGNOSIS — I4892 Unspecified atrial flutter: Secondary | ICD-10-CM | POA: Diagnosis not present

## 2021-06-23 DIAGNOSIS — R7989 Other specified abnormal findings of blood chemistry: Secondary | ICD-10-CM | POA: Diagnosis not present

## 2021-06-23 DIAGNOSIS — I482 Chronic atrial fibrillation, unspecified: Secondary | ICD-10-CM | POA: Diagnosis not present

## 2021-06-23 DIAGNOSIS — R2681 Unsteadiness on feet: Secondary | ICD-10-CM | POA: Diagnosis not present

## 2021-06-23 DIAGNOSIS — J309 Allergic rhinitis, unspecified: Secondary | ICD-10-CM | POA: Diagnosis not present

## 2021-06-23 DIAGNOSIS — R569 Unspecified convulsions: Secondary | ICD-10-CM | POA: Diagnosis not present

## 2021-06-23 DIAGNOSIS — N189 Chronic kidney disease, unspecified: Secondary | ICD-10-CM | POA: Diagnosis not present

## 2021-06-30 ENCOUNTER — Other Ambulatory Visit: Payer: Self-pay | Admitting: Internal Medicine

## 2021-07-14 DIAGNOSIS — B9689 Other specified bacterial agents as the cause of diseases classified elsewhere: Secondary | ICD-10-CM | POA: Diagnosis not present

## 2021-07-14 DIAGNOSIS — J208 Acute bronchitis due to other specified organisms: Secondary | ICD-10-CM | POA: Diagnosis not present

## 2021-07-14 DIAGNOSIS — E669 Obesity, unspecified: Secondary | ICD-10-CM | POA: Diagnosis not present

## 2021-07-14 DIAGNOSIS — I10 Essential (primary) hypertension: Secondary | ICD-10-CM | POA: Diagnosis not present

## 2021-07-14 DIAGNOSIS — Z6825 Body mass index (BMI) 25.0-25.9, adult: Secondary | ICD-10-CM | POA: Diagnosis not present

## 2021-07-14 DIAGNOSIS — E1169 Type 2 diabetes mellitus with other specified complication: Secondary | ICD-10-CM | POA: Diagnosis not present

## 2021-07-17 DIAGNOSIS — R4182 Altered mental status, unspecified: Secondary | ICD-10-CM | POA: Diagnosis not present

## 2021-07-17 DIAGNOSIS — R442 Other hallucinations: Secondary | ICD-10-CM | POA: Diagnosis not present

## 2021-07-17 DIAGNOSIS — I10 Essential (primary) hypertension: Secondary | ICD-10-CM | POA: Diagnosis not present

## 2021-07-17 DIAGNOSIS — Z743 Need for continuous supervision: Secondary | ICD-10-CM | POA: Diagnosis not present

## 2021-07-17 DIAGNOSIS — I672 Cerebral atherosclerosis: Secondary | ICD-10-CM | POA: Diagnosis not present

## 2021-07-18 DIAGNOSIS — T40425A Adverse effect of tramadol, initial encounter: Secondary | ICD-10-CM | POA: Diagnosis not present

## 2021-07-18 DIAGNOSIS — T368X5A Adverse effect of other systemic antibiotics, initial encounter: Secondary | ICD-10-CM | POA: Diagnosis not present

## 2021-07-18 DIAGNOSIS — E7800 Pure hypercholesterolemia, unspecified: Secondary | ICD-10-CM | POA: Diagnosis not present

## 2021-07-18 DIAGNOSIS — E119 Type 2 diabetes mellitus without complications: Secondary | ICD-10-CM | POA: Diagnosis not present

## 2021-07-18 DIAGNOSIS — E039 Hypothyroidism, unspecified: Secondary | ICD-10-CM | POA: Diagnosis not present

## 2021-07-18 DIAGNOSIS — I672 Cerebral atherosclerosis: Secondary | ICD-10-CM | POA: Diagnosis not present

## 2021-07-18 DIAGNOSIS — I110 Hypertensive heart disease with heart failure: Secondary | ICD-10-CM | POA: Diagnosis not present

## 2021-07-18 DIAGNOSIS — N179 Acute kidney failure, unspecified: Secondary | ICD-10-CM | POA: Diagnosis not present

## 2021-07-18 DIAGNOSIS — Z79899 Other long term (current) drug therapy: Secondary | ICD-10-CM | POA: Diagnosis not present

## 2021-07-18 DIAGNOSIS — I4891 Unspecified atrial fibrillation: Secondary | ICD-10-CM | POA: Diagnosis not present

## 2021-07-18 DIAGNOSIS — Z20822 Contact with and (suspected) exposure to covid-19: Secondary | ICD-10-CM | POA: Diagnosis not present

## 2021-07-18 DIAGNOSIS — R4182 Altered mental status, unspecified: Secondary | ICD-10-CM | POA: Diagnosis not present

## 2021-07-18 DIAGNOSIS — G928 Other toxic encephalopathy: Secondary | ICD-10-CM | POA: Diagnosis not present

## 2021-07-18 DIAGNOSIS — E875 Hyperkalemia: Secondary | ICD-10-CM | POA: Diagnosis not present

## 2021-07-18 DIAGNOSIS — I5032 Chronic diastolic (congestive) heart failure: Secondary | ICD-10-CM | POA: Diagnosis not present

## 2021-07-18 DIAGNOSIS — G40909 Epilepsy, unspecified, not intractable, without status epilepticus: Secondary | ICD-10-CM | POA: Diagnosis not present

## 2021-07-18 DIAGNOSIS — Z7901 Long term (current) use of anticoagulants: Secondary | ICD-10-CM | POA: Diagnosis not present

## 2021-07-18 DIAGNOSIS — Z87891 Personal history of nicotine dependence: Secondary | ICD-10-CM | POA: Diagnosis not present

## 2021-07-21 ENCOUNTER — Ambulatory Visit: Payer: HMO | Admitting: Cardiology

## 2021-07-21 DIAGNOSIS — E669 Obesity, unspecified: Secondary | ICD-10-CM | POA: Diagnosis not present

## 2021-07-21 DIAGNOSIS — I4892 Unspecified atrial flutter: Secondary | ICD-10-CM | POA: Diagnosis not present

## 2021-07-21 DIAGNOSIS — I482 Chronic atrial fibrillation, unspecified: Secondary | ICD-10-CM | POA: Diagnosis not present

## 2021-07-21 DIAGNOSIS — F419 Anxiety disorder, unspecified: Secondary | ICD-10-CM | POA: Diagnosis not present

## 2021-07-21 DIAGNOSIS — R7989 Other specified abnormal findings of blood chemistry: Secondary | ICD-10-CM | POA: Diagnosis not present

## 2021-07-21 DIAGNOSIS — R2681 Unsteadiness on feet: Secondary | ICD-10-CM | POA: Diagnosis not present

## 2021-07-21 DIAGNOSIS — J309 Allergic rhinitis, unspecified: Secondary | ICD-10-CM | POA: Diagnosis not present

## 2021-07-21 DIAGNOSIS — R569 Unspecified convulsions: Secondary | ICD-10-CM | POA: Diagnosis not present

## 2021-07-21 DIAGNOSIS — F3341 Major depressive disorder, recurrent, in partial remission: Secondary | ICD-10-CM | POA: Diagnosis not present

## 2021-07-21 DIAGNOSIS — E1169 Type 2 diabetes mellitus with other specified complication: Secondary | ICD-10-CM | POA: Diagnosis not present

## 2021-07-21 DIAGNOSIS — N189 Chronic kidney disease, unspecified: Secondary | ICD-10-CM | POA: Diagnosis not present

## 2021-07-21 DIAGNOSIS — M159 Polyosteoarthritis, unspecified: Secondary | ICD-10-CM | POA: Diagnosis not present

## 2021-08-03 DIAGNOSIS — I4891 Unspecified atrial fibrillation: Secondary | ICD-10-CM | POA: Diagnosis not present

## 2021-08-03 DIAGNOSIS — R079 Chest pain, unspecified: Secondary | ICD-10-CM | POA: Diagnosis not present

## 2021-08-03 DIAGNOSIS — R4182 Altered mental status, unspecified: Secondary | ICD-10-CM | POA: Diagnosis not present

## 2021-08-03 DIAGNOSIS — E785 Hyperlipidemia, unspecified: Secondary | ICD-10-CM | POA: Diagnosis not present

## 2021-08-03 DIAGNOSIS — G8929 Other chronic pain: Secondary | ICD-10-CM | POA: Diagnosis not present

## 2021-08-03 DIAGNOSIS — G928 Other toxic encephalopathy: Secondary | ICD-10-CM | POA: Diagnosis not present

## 2021-08-03 DIAGNOSIS — E039 Hypothyroidism, unspecified: Secondary | ICD-10-CM | POA: Diagnosis not present

## 2021-08-03 DIAGNOSIS — R569 Unspecified convulsions: Secondary | ICD-10-CM | POA: Diagnosis not present

## 2021-08-03 DIAGNOSIS — R4789 Other speech disturbances: Secondary | ICD-10-CM | POA: Diagnosis not present

## 2021-08-03 DIAGNOSIS — Z79899 Other long term (current) drug therapy: Secondary | ICD-10-CM | POA: Diagnosis not present

## 2021-08-03 DIAGNOSIS — I110 Hypertensive heart disease with heart failure: Secondary | ICD-10-CM | POA: Diagnosis not present

## 2021-08-03 DIAGNOSIS — I5032 Chronic diastolic (congestive) heart failure: Secondary | ICD-10-CM | POA: Diagnosis not present

## 2021-08-03 DIAGNOSIS — R7989 Other specified abnormal findings of blood chemistry: Secondary | ICD-10-CM | POA: Diagnosis not present

## 2021-08-03 DIAGNOSIS — Z7901 Long term (current) use of anticoagulants: Secondary | ICD-10-CM | POA: Diagnosis not present

## 2021-08-03 DIAGNOSIS — E119 Type 2 diabetes mellitus without complications: Secondary | ICD-10-CM | POA: Diagnosis not present

## 2021-08-03 DIAGNOSIS — F17210 Nicotine dependence, cigarettes, uncomplicated: Secondary | ICD-10-CM | POA: Diagnosis not present

## 2021-08-03 DIAGNOSIS — R0602 Shortness of breath: Secondary | ICD-10-CM | POA: Diagnosis not present

## 2021-08-03 DIAGNOSIS — M25512 Pain in left shoulder: Secondary | ICD-10-CM | POA: Diagnosis not present

## 2021-08-22 DIAGNOSIS — I482 Chronic atrial fibrillation, unspecified: Secondary | ICD-10-CM | POA: Diagnosis not present

## 2021-08-22 DIAGNOSIS — M159 Polyosteoarthritis, unspecified: Secondary | ICD-10-CM | POA: Diagnosis not present

## 2021-08-22 DIAGNOSIS — F419 Anxiety disorder, unspecified: Secondary | ICD-10-CM | POA: Diagnosis not present

## 2021-08-22 DIAGNOSIS — I4892 Unspecified atrial flutter: Secondary | ICD-10-CM | POA: Diagnosis not present

## 2021-08-22 DIAGNOSIS — R7989 Other specified abnormal findings of blood chemistry: Secondary | ICD-10-CM | POA: Diagnosis not present

## 2021-08-22 DIAGNOSIS — R2681 Unsteadiness on feet: Secondary | ICD-10-CM | POA: Diagnosis not present

## 2021-08-22 DIAGNOSIS — R569 Unspecified convulsions: Secondary | ICD-10-CM | POA: Diagnosis not present

## 2021-08-22 DIAGNOSIS — E669 Obesity, unspecified: Secondary | ICD-10-CM | POA: Diagnosis not present

## 2021-08-22 DIAGNOSIS — J309 Allergic rhinitis, unspecified: Secondary | ICD-10-CM | POA: Diagnosis not present

## 2021-08-22 DIAGNOSIS — F3341 Major depressive disorder, recurrent, in partial remission: Secondary | ICD-10-CM | POA: Diagnosis not present

## 2021-08-22 DIAGNOSIS — E1169 Type 2 diabetes mellitus with other specified complication: Secondary | ICD-10-CM | POA: Diagnosis not present

## 2021-08-22 DIAGNOSIS — N189 Chronic kidney disease, unspecified: Secondary | ICD-10-CM | POA: Diagnosis not present

## 2021-08-23 ENCOUNTER — Other Ambulatory Visit: Payer: Self-pay | Admitting: Internal Medicine

## 2021-08-24 ENCOUNTER — Other Ambulatory Visit: Payer: Self-pay

## 2021-09-19 DIAGNOSIS — J309 Allergic rhinitis, unspecified: Secondary | ICD-10-CM | POA: Diagnosis not present

## 2021-09-19 DIAGNOSIS — E669 Obesity, unspecified: Secondary | ICD-10-CM | POA: Diagnosis not present

## 2021-09-19 DIAGNOSIS — R2681 Unsteadiness on feet: Secondary | ICD-10-CM | POA: Diagnosis not present

## 2021-09-19 DIAGNOSIS — I482 Chronic atrial fibrillation, unspecified: Secondary | ICD-10-CM | POA: Diagnosis not present

## 2021-09-19 DIAGNOSIS — M159 Polyosteoarthritis, unspecified: Secondary | ICD-10-CM | POA: Diagnosis not present

## 2021-09-19 DIAGNOSIS — F419 Anxiety disorder, unspecified: Secondary | ICD-10-CM | POA: Diagnosis not present

## 2021-09-19 DIAGNOSIS — E1169 Type 2 diabetes mellitus with other specified complication: Secondary | ICD-10-CM | POA: Diagnosis not present

## 2021-09-19 DIAGNOSIS — F3341 Major depressive disorder, recurrent, in partial remission: Secondary | ICD-10-CM | POA: Diagnosis not present

## 2021-09-19 DIAGNOSIS — R7989 Other specified abnormal findings of blood chemistry: Secondary | ICD-10-CM | POA: Diagnosis not present

## 2021-09-19 DIAGNOSIS — R569 Unspecified convulsions: Secondary | ICD-10-CM | POA: Diagnosis not present

## 2021-09-19 DIAGNOSIS — I4892 Unspecified atrial flutter: Secondary | ICD-10-CM | POA: Diagnosis not present

## 2021-09-19 DIAGNOSIS — N189 Chronic kidney disease, unspecified: Secondary | ICD-10-CM | POA: Diagnosis not present

## 2021-09-26 DIAGNOSIS — R443 Hallucinations, unspecified: Secondary | ICD-10-CM | POA: Diagnosis not present

## 2021-09-26 DIAGNOSIS — I509 Heart failure, unspecified: Secondary | ICD-10-CM | POA: Diagnosis not present

## 2021-09-26 DIAGNOSIS — F23 Brief psychotic disorder: Secondary | ICD-10-CM | POA: Diagnosis not present

## 2021-09-26 DIAGNOSIS — I4891 Unspecified atrial fibrillation: Secondary | ICD-10-CM | POA: Diagnosis not present

## 2021-09-26 DIAGNOSIS — R441 Visual hallucinations: Secondary | ICD-10-CM | POA: Diagnosis not present

## 2021-09-26 DIAGNOSIS — E119 Type 2 diabetes mellitus without complications: Secondary | ICD-10-CM | POA: Diagnosis not present

## 2021-09-26 DIAGNOSIS — I110 Hypertensive heart disease with heart failure: Secondary | ICD-10-CM | POA: Diagnosis not present

## 2021-09-26 DIAGNOSIS — Z79899 Other long term (current) drug therapy: Secondary | ICD-10-CM | POA: Diagnosis not present

## 2021-09-26 DIAGNOSIS — R45851 Suicidal ideations: Secondary | ICD-10-CM | POA: Diagnosis not present

## 2021-09-26 DIAGNOSIS — E669 Obesity, unspecified: Secondary | ICD-10-CM | POA: Diagnosis not present

## 2021-09-26 DIAGNOSIS — F17210 Nicotine dependence, cigarettes, uncomplicated: Secondary | ICD-10-CM | POA: Diagnosis not present

## 2021-09-26 DIAGNOSIS — I10 Essential (primary) hypertension: Secondary | ICD-10-CM | POA: Diagnosis not present

## 2021-09-26 DIAGNOSIS — E1169 Type 2 diabetes mellitus with other specified complication: Secondary | ICD-10-CM | POA: Diagnosis not present

## 2021-09-26 DIAGNOSIS — R300 Dysuria: Secondary | ICD-10-CM | POA: Diagnosis not present

## 2021-09-26 DIAGNOSIS — J449 Chronic obstructive pulmonary disease, unspecified: Secondary | ICD-10-CM | POA: Diagnosis not present

## 2021-09-26 DIAGNOSIS — R462 Strange and inexplicable behavior: Secondary | ICD-10-CM | POA: Diagnosis not present

## 2021-09-26 DIAGNOSIS — N390 Urinary tract infection, site not specified: Secondary | ICD-10-CM | POA: Diagnosis not present

## 2021-09-26 DIAGNOSIS — Z6825 Body mass index (BMI) 25.0-25.9, adult: Secondary | ICD-10-CM | POA: Diagnosis not present

## 2021-10-11 DIAGNOSIS — I10 Essential (primary) hypertension: Secondary | ICD-10-CM | POA: Diagnosis not present

## 2021-10-11 DIAGNOSIS — F3341 Major depressive disorder, recurrent, in partial remission: Secondary | ICD-10-CM | POA: Diagnosis not present

## 2021-10-11 DIAGNOSIS — D509 Iron deficiency anemia, unspecified: Secondary | ICD-10-CM | POA: Diagnosis not present

## 2021-10-11 DIAGNOSIS — R7989 Other specified abnormal findings of blood chemistry: Secondary | ICD-10-CM | POA: Diagnosis not present

## 2021-10-11 DIAGNOSIS — N189 Chronic kidney disease, unspecified: Secondary | ICD-10-CM | POA: Diagnosis not present

## 2021-10-11 DIAGNOSIS — E669 Obesity, unspecified: Secondary | ICD-10-CM | POA: Diagnosis not present

## 2021-10-11 DIAGNOSIS — F419 Anxiety disorder, unspecified: Secondary | ICD-10-CM | POA: Diagnosis not present

## 2021-10-11 DIAGNOSIS — E7800 Pure hypercholesterolemia, unspecified: Secondary | ICD-10-CM | POA: Diagnosis not present

## 2021-10-11 DIAGNOSIS — R569 Unspecified convulsions: Secondary | ICD-10-CM | POA: Diagnosis not present

## 2021-10-11 DIAGNOSIS — M159 Polyosteoarthritis, unspecified: Secondary | ICD-10-CM | POA: Diagnosis not present

## 2021-10-11 DIAGNOSIS — I4892 Unspecified atrial flutter: Secondary | ICD-10-CM | POA: Diagnosis not present

## 2021-10-11 DIAGNOSIS — J309 Allergic rhinitis, unspecified: Secondary | ICD-10-CM | POA: Diagnosis not present

## 2021-10-11 DIAGNOSIS — I482 Chronic atrial fibrillation, unspecified: Secondary | ICD-10-CM | POA: Diagnosis not present

## 2021-10-11 DIAGNOSIS — R2681 Unsteadiness on feet: Secondary | ICD-10-CM | POA: Diagnosis not present

## 2021-10-11 DIAGNOSIS — E1169 Type 2 diabetes mellitus with other specified complication: Secondary | ICD-10-CM | POA: Diagnosis not present

## 2021-10-11 DIAGNOSIS — E039 Hypothyroidism, unspecified: Secondary | ICD-10-CM | POA: Diagnosis not present

## 2021-10-12 ENCOUNTER — Telehealth: Payer: Self-pay | Admitting: Cardiology

## 2021-10-12 NOTE — Telephone Encounter (Signed)
Patient wants to know if she can take her rosuvastatin (CRESTOR) 20 MG tablet more than once a month.  She states her PCP did lab work on her and her cholesterol can back high.

## 2021-10-13 NOTE — Telephone Encounter (Signed)
Advised pt that she needs to discuss with her PCP as we do not have any recent lab results and have not seen her since 2021. Pt verbalized understanding and had no additional questions.

## 2021-11-07 DIAGNOSIS — E785 Hyperlipidemia, unspecified: Secondary | ICD-10-CM | POA: Diagnosis not present

## 2021-11-07 DIAGNOSIS — Z0000 Encounter for general adult medical examination without abnormal findings: Secondary | ICD-10-CM | POA: Diagnosis not present

## 2021-11-07 DIAGNOSIS — Z9181 History of falling: Secondary | ICD-10-CM | POA: Diagnosis not present

## 2021-11-07 DIAGNOSIS — Z1331 Encounter for screening for depression: Secondary | ICD-10-CM | POA: Diagnosis not present

## 2021-11-08 DIAGNOSIS — I482 Chronic atrial fibrillation, unspecified: Secondary | ICD-10-CM | POA: Diagnosis not present

## 2021-11-08 DIAGNOSIS — N189 Chronic kidney disease, unspecified: Secondary | ICD-10-CM | POA: Diagnosis not present

## 2021-11-08 DIAGNOSIS — M159 Polyosteoarthritis, unspecified: Secondary | ICD-10-CM | POA: Diagnosis not present

## 2021-11-08 DIAGNOSIS — R7989 Other specified abnormal findings of blood chemistry: Secondary | ICD-10-CM | POA: Diagnosis not present

## 2021-11-08 DIAGNOSIS — E669 Obesity, unspecified: Secondary | ICD-10-CM | POA: Diagnosis not present

## 2021-11-08 DIAGNOSIS — F3341 Major depressive disorder, recurrent, in partial remission: Secondary | ICD-10-CM | POA: Diagnosis not present

## 2021-11-08 DIAGNOSIS — R569 Unspecified convulsions: Secondary | ICD-10-CM | POA: Diagnosis not present

## 2021-11-08 DIAGNOSIS — E1169 Type 2 diabetes mellitus with other specified complication: Secondary | ICD-10-CM | POA: Diagnosis not present

## 2021-11-08 DIAGNOSIS — F419 Anxiety disorder, unspecified: Secondary | ICD-10-CM | POA: Diagnosis not present

## 2021-11-08 DIAGNOSIS — I4892 Unspecified atrial flutter: Secondary | ICD-10-CM | POA: Diagnosis not present

## 2021-11-08 DIAGNOSIS — R2681 Unsteadiness on feet: Secondary | ICD-10-CM | POA: Diagnosis not present

## 2021-11-08 DIAGNOSIS — J309 Allergic rhinitis, unspecified: Secondary | ICD-10-CM | POA: Diagnosis not present

## 2021-11-21 ENCOUNTER — Ambulatory Visit: Payer: HMO | Admitting: Cardiology

## 2021-11-21 NOTE — Progress Notes (Deleted)
?Cardiology Office Note:   ? ?Date:  11/21/2021  ? ?ID:  Brianna Porter, DOB [birth year], MRN [id number] ? ?PCP:  Cher Nakai, MD  ?Cardiologist:  Shirlee More, MD   ? ?Referring MD: Cher Nakai, MD  ? ? ?ASSESSMENT:   ? ?No diagnosis found. ?PLAN:   ? ?In order of problems listed above: ? ?*** ? ? ?Next appointment: *** ? ? ?Medication Adjustments/Labs and Tests Ordered: ?Current medicines are reviewed at length with the patient today.  Concerns regarding medicines are outlined above.  ?No orders of the defined types were placed in this encounter. ? ?No orders of the defined types were placed in this encounter. ? ? ?No chief complaint on file. ? ? ?History of Present Illness:   ? ?Brianna Porter is a 61 y.o. female with a hx of persistentatrial fibrillation with adverse reaction to amiodarone with both thyrotoxicosis and subsequent severe symptomatic hypothyroidism and QT prolongation opting for rate control hypertensive heart disease with chronic systolic heart failure chronic anticoagulation stage III CKD and mild CAD. She was admitted to [facility] 03/04/2020 to 03/18/2020 with the predominant complaint of weakness.  I she was found to be severely hypothyroid and adrenal insufficient and was put on thyroid and adrenal replacement therapy.  Amiodarone was discontinued in hospital and was found to be in atrial fibrillation with QTC prolongation.  Repeat echocardiogram showed improvement in ejection fraction to 40 to 45% she had abnormal liver function with hepatic steatosis and had stable stage III CKD .  She was last seen 03/24/2020. ?Compliance with diet, lifestyle and medications: *** ?Past Medical History:  ?Diagnosis Date  ? Acute gastric ulcer with hemorrhage   ? Altered mental status 05/07/2019  ? Anxiety and depression   ? Atrial flutter ([place])   ? CAD (coronary artery disease)   ? Chronic systolic CHF (congestive heart failure) ([place])   ? a. dx in setting of atrial fib/flutter - possibly tachy  mediated. Coronary CTA with only mild CAD in 04/2019.  ? CKD (chronic kidney disease) stage 3, GFR 30-59 ml/min (HCC) 04/23/2019  ? Confusion   ? a. persistent confusion during 04/2019 admission of unclear cause. Home meds adjusted. CT/MRI brain nonacute.  ? Depression 03/02/2019  ? Diabetes ([place])   ? Edema   ? Elevated liver function tests   ? Essential hypertension   ? Falls   ? Gastrointestinal hemorrhage   ? Hypercholesteremia   ? Hyperkalemia   ? Hyperlipidemia 04/23/2019  ? Hypertensive heart and chronic kidney disease with systolic congestive heart failure ([place])   ? Hypertensive heart disease 03/02/2019  ? Hyperthyroidism   ? Hypokalemia   ? Hypokalemia   ? Hypomagnesemia   ? Hyponatremia   ? Iron deficiency anemia   ? Mild CAD   ? a. Coronary CT 04/2019 - Minimal, Non-obstructive CAD.  ? NICM (nonischemic cardiomyopathy) ([place])   ? Osteoarthritis 03/02/2019  ? PAF (paroxysmal atrial fibrillation) ([place])   ? Pressure injury of skin 07/09/2019  ? Prolonged QT interval   ? Symptomatic anemia 06/28/2019  ? ? ?Past Surgical History:  ?Procedure Laterality Date  ? BIOPSY  06/29/2019  ? Procedure: BIOPSY;  Surgeon: Irene Shipper, MD;  Location: [place];  Service: Endoscopy;;  ? CARDIOVERSION N/A 04/29/2019  ? Procedure: CARDIOVERSION;  Surgeon: Sanda Klein, MD;  Location: [place];  Service: Cardiovascular;  Laterality: N/A;  ? CARDIOVERSION N/A 05/04/2019  ? Procedure: CARDIOVERSION;  Surgeon: Josue Hector, MD;  Location: [place] ENDOSCOPY;  Service: Cardiovascular;  Laterality: N/A;  ? COLONOSCOPY WITH PROPOFOL N/A 07/12/2019  ? Procedure: COLONOSCOPY WITH PROPOFOL;  Surgeon: Doran Stabler, MD;  Location: [place];  Service: Gastroenterology;  Laterality: N/A;  ? ENTEROSCOPY N/A 07/10/2019  ? Procedure: ENTEROSCOPY;  Surgeon: Doran Stabler, MD;  Location: [place];  Service: Gastroenterology;  Laterality: N/A;  ? ESOPHAGOGASTRODUODENOSCOPY  06/29/2019  ? ESOPHAGOGASTRODUODENOSCOPY (EGD) WITH PROPOFOL  N/A 06/29/2019  ? Procedure: ESOPHAGOGASTRODUODENOSCOPY (EGD) WITH PROPOFOL;  Surgeon: Irene Shipper, MD;  Location: [facility] ENDOSCOPY;  Service: Endoscopy;  Laterality: N/A;  ? GIVENS CAPSULE STUDY N/A 07/12/2019  ? Procedure: GIVENS CAPSULE STUDY;  Surgeon: Doran Stabler, MD;  Location: [place];  Service: Gastroenterology;  Laterality: N/A;  ? HEMOSTASIS CLIP PLACEMENT  07/12/2019  ? Procedure: HEMOSTASIS CLIP PLACEMENT;  Surgeon: Doran Stabler, MD;  Location: [place];  Service: Gastroenterology;;  ? INTRAMEDULLARY (IM) NAIL INTERTROCHANTERIC Right 03/15/2021  ? Procedure: INTRAMEDULLARY (IM) NAIL INTERTROCHANTRIC;  Surgeon: Shona Needles, MD;  Location: [place];  Service: Orthopedics;  Laterality: Right;  ? POLYPECTOMY  07/12/2019  ? Procedure: POLYPECTOMY;  Surgeon: Doran Stabler, MD;  Location: [facility] ENDOSCOPY;  Service: Gastroenterology;;  ? SMALL BOWEL ENTEROSCOPY  07/10/2019  ? SUBMUCOSAL TATTOO INJECTION  07/10/2019  ? Procedure: SUBMUCOSAL TATTOO INJECTION;  Surgeon: Doran Stabler, MD;  Location: [place];  Service: Gastroenterology;;  ? TEE WITHOUT CARDIOVERSION  04/29/2019  ? TEE WITHOUT CARDIOVERSION N/A 04/29/2019  ? Procedure: TRANSESOPHAGEAL ECHOCARDIOGRAM (TEE);  Surgeon: Sanda Klein, MD;  Location: [place];  Service: Cardiovascular;  Laterality: N/A;  ? TUBAL LIGATION    ? ? ?Current Medications: ?No outpatient medications have been marked as taking for the 11/21/21 encounter (Appointment) with Richardo Priest, MD.  ?  ? ?Allergies:   Ambien [zolpidem tartrate], Mobic [meloxicam], Adhesive [tape], and Codeine  ? ?Social History  ? ?Socioeconomic History  ? Marital status: Divorced  ?  Spouse name: Not on file  ? Number of children: Not on file  ? Years of education: Not on file  ? Highest education level: Not on file  ?Occupational History  ? Not on file  ?Tobacco Use  ? Smoking status: Former  ?  Packs/day: 2.00  ?  Years: 30.00  ?  Pack years: 60.00  ?  Types:  Cigarettes  ?  Quit date: 2001  ?  Years since quitting: 22.2  ? Smokeless tobacco: Never  ?Vaping Use  ? Vaping Use: Never used  ?Substance and Sexual Activity  ? Alcohol use: Never  ? Drug use: Never  ? Sexual activity: Not Currently  ?Other Topics Concern  ? Not on file  ?Social History Narrative  ? Not on file  ? ?Social Determinants of Health  ? ?Financial Resource Strain: Not on file  ?Food Insecurity: Not on file  ?Transportation Needs: Not on file  ?Physical Activity: Not on file  ?Stress: Not on file  ?Social Connections: Not on file  ?  ? ?Family History: ?The patient's ***family history includes Cancer in her mother; Heart disease in her father and mother; Hypercholesterolemia in her brother and mother; Osteoarthritis in her mother; Stroke in her mother. ?ROS:   ?Please see the history of present illness.    ?All other systems reviewed and are negative. ? ?EKGs/Labs/Other Studies Reviewed:   ? ?The following studies were reviewed today: ? ?EKG:  EKG ordered today and personally reviewed.  The ekg ordered today demonstrates *** ? ?  Recent Labs: ?03/15/2021: ALT 15 ?03/17/2021: BUN 24; Creatinine, Ser 1.44; Hemoglobin 8.9; Platelets 279; Potassium 4.1; Sodium 136  ?Recent Lipid Panel ?No results found for: CHOL, TRIG, HDL, CHOLHDL, VLDL, LDLCALC, LDLDIRECT ? ?Physical Exam:   ? ?VS:  LMP  (LMP Unknown) Comment: ?10 years ago?   ? ?Wt Readings from Last 3 Encounters:  ?03/14/21 146 lb (66.2 kg)  ?03/16/20 (!) 210 lb (95.3 kg)  ?12/25/19 151 lb 0.2 oz (68.5 kg)  ?  ? ?GEN: *** Well nourished, well developed in no acute distress ?HEENT: Normal ?NECK: No JVD; No carotid bruits ?LYMPHATICS: No lymphadenopathy ?CARDIAC: ***RRR, no murmurs, rubs, gallops ?RESPIRATORY:  Clear to auscultation without rales, wheezing or rhonchi  ?ABDOMEN: Soft, non-tender, non-distended ?MUSCULOSKELETAL:  No edema; No deformity  ?SKIN: Warm and dry ?NEUROLOGIC:  Alert and oriented x 3 ?PSYCHIATRIC:  Normal affect  ? ? ?Signed, ?Shirlee More, MD  ?11/21/2021 11:22 AM    ?[place]  ?

## 2021-12-07 DIAGNOSIS — F3341 Major depressive disorder, recurrent, in partial remission: Secondary | ICD-10-CM | POA: Diagnosis not present

## 2021-12-07 DIAGNOSIS — J309 Allergic rhinitis, unspecified: Secondary | ICD-10-CM | POA: Diagnosis not present

## 2021-12-07 DIAGNOSIS — E1169 Type 2 diabetes mellitus with other specified complication: Secondary | ICD-10-CM | POA: Diagnosis not present

## 2021-12-07 DIAGNOSIS — E669 Obesity, unspecified: Secondary | ICD-10-CM | POA: Diagnosis not present

## 2021-12-07 DIAGNOSIS — R7989 Other specified abnormal findings of blood chemistry: Secondary | ICD-10-CM | POA: Diagnosis not present

## 2021-12-07 DIAGNOSIS — N390 Urinary tract infection, site not specified: Secondary | ICD-10-CM | POA: Diagnosis not present

## 2021-12-07 DIAGNOSIS — F419 Anxiety disorder, unspecified: Secondary | ICD-10-CM | POA: Diagnosis not present

## 2021-12-07 DIAGNOSIS — N189 Chronic kidney disease, unspecified: Secondary | ICD-10-CM | POA: Diagnosis not present

## 2021-12-07 DIAGNOSIS — M159 Polyosteoarthritis, unspecified: Secondary | ICD-10-CM | POA: Diagnosis not present

## 2021-12-07 DIAGNOSIS — I4892 Unspecified atrial flutter: Secondary | ICD-10-CM | POA: Diagnosis not present

## 2021-12-07 DIAGNOSIS — R569 Unspecified convulsions: Secondary | ICD-10-CM | POA: Diagnosis not present

## 2021-12-07 DIAGNOSIS — B370 Candidal stomatitis: Secondary | ICD-10-CM | POA: Diagnosis not present

## 2021-12-12 DIAGNOSIS — I700 Atherosclerosis of aorta: Secondary | ICD-10-CM | POA: Diagnosis not present

## 2021-12-12 DIAGNOSIS — Z8659 Personal history of other mental and behavioral disorders: Secondary | ICD-10-CM | POA: Diagnosis not present

## 2021-12-12 DIAGNOSIS — Z794 Long term (current) use of insulin: Secondary | ICD-10-CM | POA: Diagnosis not present

## 2021-12-12 DIAGNOSIS — E1151 Type 2 diabetes mellitus with diabetic peripheral angiopathy without gangrene: Secondary | ICD-10-CM | POA: Diagnosis not present

## 2021-12-12 DIAGNOSIS — I10 Essential (primary) hypertension: Secondary | ICD-10-CM | POA: Diagnosis not present

## 2021-12-12 DIAGNOSIS — Z7901 Long term (current) use of anticoagulants: Secondary | ICD-10-CM | POA: Diagnosis not present

## 2021-12-12 DIAGNOSIS — R569 Unspecified convulsions: Secondary | ICD-10-CM | POA: Diagnosis not present

## 2021-12-12 DIAGNOSIS — I480 Paroxysmal atrial fibrillation: Secondary | ICD-10-CM | POA: Diagnosis not present

## 2021-12-12 DIAGNOSIS — D6869 Other thrombophilia: Secondary | ICD-10-CM | POA: Diagnosis not present

## 2022-01-04 DIAGNOSIS — I4892 Unspecified atrial flutter: Secondary | ICD-10-CM | POA: Diagnosis not present

## 2022-01-04 DIAGNOSIS — I482 Chronic atrial fibrillation, unspecified: Secondary | ICD-10-CM | POA: Diagnosis not present

## 2022-01-04 DIAGNOSIS — F419 Anxiety disorder, unspecified: Secondary | ICD-10-CM | POA: Diagnosis not present

## 2022-01-04 DIAGNOSIS — E1169 Type 2 diabetes mellitus with other specified complication: Secondary | ICD-10-CM | POA: Diagnosis not present

## 2022-01-04 DIAGNOSIS — N189 Chronic kidney disease, unspecified: Secondary | ICD-10-CM | POA: Diagnosis not present

## 2022-01-04 DIAGNOSIS — E669 Obesity, unspecified: Secondary | ICD-10-CM | POA: Diagnosis not present

## 2022-01-04 DIAGNOSIS — F3341 Major depressive disorder, recurrent, in partial remission: Secondary | ICD-10-CM | POA: Diagnosis not present

## 2022-01-04 DIAGNOSIS — R2681 Unsteadiness on feet: Secondary | ICD-10-CM | POA: Diagnosis not present

## 2022-01-04 DIAGNOSIS — M159 Polyosteoarthritis, unspecified: Secondary | ICD-10-CM | POA: Diagnosis not present

## 2022-01-04 DIAGNOSIS — J309 Allergic rhinitis, unspecified: Secondary | ICD-10-CM | POA: Diagnosis not present

## 2022-01-04 DIAGNOSIS — R7989 Other specified abnormal findings of blood chemistry: Secondary | ICD-10-CM | POA: Diagnosis not present

## 2022-01-04 DIAGNOSIS — R569 Unspecified convulsions: Secondary | ICD-10-CM | POA: Diagnosis not present

## 2022-01-23 DIAGNOSIS — N189 Chronic kidney disease, unspecified: Secondary | ICD-10-CM | POA: Diagnosis not present

## 2022-01-23 DIAGNOSIS — E669 Obesity, unspecified: Secondary | ICD-10-CM | POA: Diagnosis not present

## 2022-01-23 DIAGNOSIS — R2681 Unsteadiness on feet: Secondary | ICD-10-CM | POA: Diagnosis not present

## 2022-01-23 DIAGNOSIS — R569 Unspecified convulsions: Secondary | ICD-10-CM | POA: Diagnosis not present

## 2022-01-23 DIAGNOSIS — R7989 Other specified abnormal findings of blood chemistry: Secondary | ICD-10-CM | POA: Diagnosis not present

## 2022-01-23 DIAGNOSIS — I482 Chronic atrial fibrillation, unspecified: Secondary | ICD-10-CM | POA: Diagnosis not present

## 2022-01-23 DIAGNOSIS — M159 Polyosteoarthritis, unspecified: Secondary | ICD-10-CM | POA: Diagnosis not present

## 2022-01-23 DIAGNOSIS — F3341 Major depressive disorder, recurrent, in partial remission: Secondary | ICD-10-CM | POA: Diagnosis not present

## 2022-01-23 DIAGNOSIS — F419 Anxiety disorder, unspecified: Secondary | ICD-10-CM | POA: Diagnosis not present

## 2022-01-23 DIAGNOSIS — E1169 Type 2 diabetes mellitus with other specified complication: Secondary | ICD-10-CM | POA: Diagnosis not present

## 2022-01-23 DIAGNOSIS — J309 Allergic rhinitis, unspecified: Secondary | ICD-10-CM | POA: Diagnosis not present

## 2022-01-23 DIAGNOSIS — I4892 Unspecified atrial flutter: Secondary | ICD-10-CM | POA: Diagnosis not present

## 2022-02-01 DIAGNOSIS — R569 Unspecified convulsions: Secondary | ICD-10-CM | POA: Diagnosis not present

## 2022-02-01 DIAGNOSIS — M159 Polyosteoarthritis, unspecified: Secondary | ICD-10-CM | POA: Diagnosis not present

## 2022-02-01 DIAGNOSIS — E1169 Type 2 diabetes mellitus with other specified complication: Secondary | ICD-10-CM | POA: Diagnosis not present

## 2022-02-01 DIAGNOSIS — I4892 Unspecified atrial flutter: Secondary | ICD-10-CM | POA: Diagnosis not present

## 2022-02-01 DIAGNOSIS — R2681 Unsteadiness on feet: Secondary | ICD-10-CM | POA: Diagnosis not present

## 2022-02-01 DIAGNOSIS — R7989 Other specified abnormal findings of blood chemistry: Secondary | ICD-10-CM | POA: Diagnosis not present

## 2022-02-01 DIAGNOSIS — F3341 Major depressive disorder, recurrent, in partial remission: Secondary | ICD-10-CM | POA: Diagnosis not present

## 2022-02-01 DIAGNOSIS — I482 Chronic atrial fibrillation, unspecified: Secondary | ICD-10-CM | POA: Diagnosis not present

## 2022-02-01 DIAGNOSIS — E669 Obesity, unspecified: Secondary | ICD-10-CM | POA: Diagnosis not present

## 2022-02-01 DIAGNOSIS — N189 Chronic kidney disease, unspecified: Secondary | ICD-10-CM | POA: Diagnosis not present

## 2022-02-01 DIAGNOSIS — J309 Allergic rhinitis, unspecified: Secondary | ICD-10-CM | POA: Diagnosis not present

## 2022-02-01 DIAGNOSIS — F419 Anxiety disorder, unspecified: Secondary | ICD-10-CM | POA: Diagnosis not present

## 2022-02-06 DIAGNOSIS — F419 Anxiety disorder, unspecified: Secondary | ICD-10-CM | POA: Diagnosis not present

## 2022-02-06 DIAGNOSIS — E669 Obesity, unspecified: Secondary | ICD-10-CM | POA: Diagnosis not present

## 2022-02-06 DIAGNOSIS — J309 Allergic rhinitis, unspecified: Secondary | ICD-10-CM | POA: Diagnosis not present

## 2022-02-06 DIAGNOSIS — M159 Polyosteoarthritis, unspecified: Secondary | ICD-10-CM | POA: Diagnosis not present

## 2022-02-06 DIAGNOSIS — E1169 Type 2 diabetes mellitus with other specified complication: Secondary | ICD-10-CM | POA: Diagnosis not present

## 2022-02-06 DIAGNOSIS — F3341 Major depressive disorder, recurrent, in partial remission: Secondary | ICD-10-CM | POA: Diagnosis not present

## 2022-02-06 DIAGNOSIS — N189 Chronic kidney disease, unspecified: Secondary | ICD-10-CM | POA: Diagnosis not present

## 2022-02-06 DIAGNOSIS — R2681 Unsteadiness on feet: Secondary | ICD-10-CM | POA: Diagnosis not present

## 2022-02-06 DIAGNOSIS — R569 Unspecified convulsions: Secondary | ICD-10-CM | POA: Diagnosis not present

## 2022-02-06 DIAGNOSIS — R7989 Other specified abnormal findings of blood chemistry: Secondary | ICD-10-CM | POA: Diagnosis not present

## 2022-02-06 DIAGNOSIS — I4892 Unspecified atrial flutter: Secondary | ICD-10-CM | POA: Diagnosis not present

## 2022-02-06 DIAGNOSIS — I482 Chronic atrial fibrillation, unspecified: Secondary | ICD-10-CM | POA: Diagnosis not present

## 2022-02-21 DIAGNOSIS — I4892 Unspecified atrial flutter: Secondary | ICD-10-CM | POA: Diagnosis not present

## 2022-02-21 DIAGNOSIS — M159 Polyosteoarthritis, unspecified: Secondary | ICD-10-CM | POA: Diagnosis not present

## 2022-02-21 DIAGNOSIS — E1169 Type 2 diabetes mellitus with other specified complication: Secondary | ICD-10-CM | POA: Diagnosis not present

## 2022-02-21 DIAGNOSIS — R569 Unspecified convulsions: Secondary | ICD-10-CM | POA: Diagnosis not present

## 2022-02-21 DIAGNOSIS — N189 Chronic kidney disease, unspecified: Secondary | ICD-10-CM | POA: Diagnosis not present

## 2022-02-21 DIAGNOSIS — E669 Obesity, unspecified: Secondary | ICD-10-CM | POA: Diagnosis not present

## 2022-02-21 DIAGNOSIS — J309 Allergic rhinitis, unspecified: Secondary | ICD-10-CM | POA: Diagnosis not present

## 2022-02-21 DIAGNOSIS — I482 Chronic atrial fibrillation, unspecified: Secondary | ICD-10-CM | POA: Diagnosis not present

## 2022-02-21 DIAGNOSIS — R2681 Unsteadiness on feet: Secondary | ICD-10-CM | POA: Diagnosis not present

## 2022-02-21 DIAGNOSIS — R7989 Other specified abnormal findings of blood chemistry: Secondary | ICD-10-CM | POA: Diagnosis not present

## 2022-02-21 DIAGNOSIS — F419 Anxiety disorder, unspecified: Secondary | ICD-10-CM | POA: Diagnosis not present

## 2022-02-21 DIAGNOSIS — F3341 Major depressive disorder, recurrent, in partial remission: Secondary | ICD-10-CM | POA: Diagnosis not present

## 2022-03-02 DIAGNOSIS — R569 Unspecified convulsions: Secondary | ICD-10-CM | POA: Diagnosis not present

## 2022-03-02 DIAGNOSIS — Z6826 Body mass index (BMI) 26.0-26.9, adult: Secondary | ICD-10-CM | POA: Diagnosis not present

## 2022-03-02 DIAGNOSIS — I4892 Unspecified atrial flutter: Secondary | ICD-10-CM | POA: Diagnosis not present

## 2022-03-02 DIAGNOSIS — F419 Anxiety disorder, unspecified: Secondary | ICD-10-CM | POA: Diagnosis not present

## 2022-03-02 DIAGNOSIS — J309 Allergic rhinitis, unspecified: Secondary | ICD-10-CM | POA: Diagnosis not present

## 2022-03-02 DIAGNOSIS — F3341 Major depressive disorder, recurrent, in partial remission: Secondary | ICD-10-CM | POA: Diagnosis not present

## 2022-03-02 DIAGNOSIS — E669 Obesity, unspecified: Secondary | ICD-10-CM | POA: Diagnosis not present

## 2022-03-02 DIAGNOSIS — N189 Chronic kidney disease, unspecified: Secondary | ICD-10-CM | POA: Diagnosis not present

## 2022-03-02 DIAGNOSIS — I482 Chronic atrial fibrillation, unspecified: Secondary | ICD-10-CM | POA: Diagnosis not present

## 2022-03-02 DIAGNOSIS — M159 Polyosteoarthritis, unspecified: Secondary | ICD-10-CM | POA: Diagnosis not present

## 2022-03-02 DIAGNOSIS — E1169 Type 2 diabetes mellitus with other specified complication: Secondary | ICD-10-CM | POA: Diagnosis not present

## 2022-03-02 DIAGNOSIS — R7989 Other specified abnormal findings of blood chemistry: Secondary | ICD-10-CM | POA: Diagnosis not present

## 2022-03-09 DIAGNOSIS — M25512 Pain in left shoulder: Secondary | ICD-10-CM | POA: Diagnosis not present

## 2022-03-09 DIAGNOSIS — E7800 Pure hypercholesterolemia, unspecified: Secondary | ICD-10-CM | POA: Diagnosis not present

## 2022-03-09 DIAGNOSIS — E039 Hypothyroidism, unspecified: Secondary | ICD-10-CM | POA: Diagnosis not present

## 2022-03-09 DIAGNOSIS — R569 Unspecified convulsions: Secondary | ICD-10-CM | POA: Diagnosis not present

## 2022-03-09 DIAGNOSIS — D509 Iron deficiency anemia, unspecified: Secondary | ICD-10-CM | POA: Diagnosis not present

## 2022-03-09 DIAGNOSIS — E875 Hyperkalemia: Secondary | ICD-10-CM | POA: Diagnosis not present

## 2022-03-09 DIAGNOSIS — K922 Gastrointestinal hemorrhage, unspecified: Secondary | ICD-10-CM | POA: Diagnosis not present

## 2022-03-09 DIAGNOSIS — R9431 Abnormal electrocardiogram [ECG] [EKG]: Secondary | ICD-10-CM | POA: Diagnosis not present

## 2022-03-09 DIAGNOSIS — F419 Anxiety disorder, unspecified: Secondary | ICD-10-CM | POA: Diagnosis not present

## 2022-03-09 DIAGNOSIS — E1169 Type 2 diabetes mellitus with other specified complication: Secondary | ICD-10-CM | POA: Diagnosis not present

## 2022-03-09 DIAGNOSIS — I5022 Chronic systolic (congestive) heart failure: Secondary | ICD-10-CM | POA: Diagnosis not present

## 2022-03-09 DIAGNOSIS — I4892 Unspecified atrial flutter: Secondary | ICD-10-CM | POA: Diagnosis not present

## 2022-03-09 DIAGNOSIS — R2681 Unsteadiness on feet: Secondary | ICD-10-CM | POA: Diagnosis not present

## 2022-03-09 DIAGNOSIS — N189 Chronic kidney disease, unspecified: Secondary | ICD-10-CM | POA: Diagnosis not present

## 2022-03-09 DIAGNOSIS — J309 Allergic rhinitis, unspecified: Secondary | ICD-10-CM | POA: Diagnosis not present

## 2022-03-09 DIAGNOSIS — I482 Chronic atrial fibrillation, unspecified: Secondary | ICD-10-CM | POA: Diagnosis not present

## 2022-03-09 DIAGNOSIS — I10 Essential (primary) hypertension: Secondary | ICD-10-CM | POA: Diagnosis not present

## 2022-03-24 DIAGNOSIS — I10 Essential (primary) hypertension: Secondary | ICD-10-CM | POA: Diagnosis not present

## 2022-03-24 DIAGNOSIS — F419 Anxiety disorder, unspecified: Secondary | ICD-10-CM | POA: Diagnosis not present

## 2022-03-24 DIAGNOSIS — E669 Obesity, unspecified: Secondary | ICD-10-CM | POA: Diagnosis not present

## 2022-03-24 DIAGNOSIS — L0390 Cellulitis, unspecified: Secondary | ICD-10-CM | POA: Diagnosis not present

## 2022-03-24 DIAGNOSIS — M159 Polyosteoarthritis, unspecified: Secondary | ICD-10-CM | POA: Diagnosis not present

## 2022-03-24 DIAGNOSIS — E1169 Type 2 diabetes mellitus with other specified complication: Secondary | ICD-10-CM | POA: Diagnosis not present

## 2022-04-10 DIAGNOSIS — I480 Paroxysmal atrial fibrillation: Secondary | ICD-10-CM | POA: Diagnosis not present

## 2022-04-10 DIAGNOSIS — E119 Type 2 diabetes mellitus without complications: Secondary | ICD-10-CM | POA: Diagnosis not present

## 2022-04-10 DIAGNOSIS — I10 Essential (primary) hypertension: Secondary | ICD-10-CM | POA: Diagnosis not present

## 2022-04-10 DIAGNOSIS — Z7984 Long term (current) use of oral hypoglycemic drugs: Secondary | ICD-10-CM | POA: Diagnosis not present

## 2022-04-10 DIAGNOSIS — D6869 Other thrombophilia: Secondary | ICD-10-CM | POA: Diagnosis not present

## 2022-04-10 DIAGNOSIS — Z7901 Long term (current) use of anticoagulants: Secondary | ICD-10-CM | POA: Diagnosis not present

## 2022-04-24 DIAGNOSIS — I10 Essential (primary) hypertension: Secondary | ICD-10-CM | POA: Diagnosis not present

## 2022-06-17 NOTE — Progress Notes (Deleted)
Cardiology Office Note:    Date:  06/17/2022   ID:  Brianna Porter, DOB Oct 11, 1960, MRN [id number]  PCP:  Cher Nakai, MD  Cardiologist:  Shirlee More, MD    Referring MD: Cher Nakai, MD    ASSESSMENT:    No diagnosis found. PLAN:    In order of problems listed above:  ***   Next appointment: ***   Medication Adjustments/Labs and Tests Ordered: Current medicines are reviewed at length with the patient today.  Concerns regarding medicines are outlined above.  No orders of the defined types were placed in this encounter.  No orders of the defined types were placed in this encounter.   No chief complaint on file.   History of Present Illness:    Brianna Porter is a 61 y.o. female with a hx of atrial fibrillation persistent amiodarone intolerance and toxicity with thyrotoxicosis chronic anticoagulation hypertensive heart disease with heart failure stage III CKD and mild CAD last seen 03/24/2020.  She was admitted to [facility] 03/04/2020 to 03/18/2020 with the predominant complaint of weakness.  She was found to be severely hypothyroid and adrenal insufficient and was put on thyroid and adrenal replacement therapy.  Amiodarone was discontinued in hospital and was found to be in atrial fibrillation with QTC prolongation.  Repeat echocardiogram showed improvement in ejection fraction to 40 to 45% she had abnormal liver function with hepatic steatosis and had stable stage III CKD.  [facility] EKG 08/03/2021 shows sinus rhythm nonspecific T waves. Echocardiogram 08/23/2020 showed an EF of 60 to 65% elevated left ventricular and left atrial pressure mild mitral and tricuspid regurgitation.  Compliance with diet, lifestyle and medications: *** Past Medical History:  Diagnosis Date   Acute gastric ulcer with hemorrhage    Altered mental status 05/07/2019   Anxiety and depression    Atrial flutter (HCC)    CAD (coronary artery disease)    Chronic  systolic CHF (congestive heart failure) ([place])    a. dx in setting of atrial fib/flutter - possibly tachy mediated. Coronary CTA with only mild CAD in 04/2019.   CKD (chronic kidney disease) stage 3, GFR 30-59 ml/min (HCC) 04/23/2019   Confusion    a. persistent confusion during 04/2019 admission of unclear cause. Home meds adjusted. CT/MRI brain nonacute.   Depression 03/02/2019   Diabetes ([place])    Edema    Elevated liver function tests    Essential hypertension    Falls    Gastrointestinal hemorrhage    Hypercholesteremia    Hyperkalemia    Hyperlipidemia 04/23/2019   Hypertensive heart and chronic kidney disease with systolic congestive heart failure ([place])    Hypertensive heart disease 03/02/2019   Hyperthyroidism    Hypokalemia    Hypokalemia    Hypomagnesemia    Hyponatremia    Iron deficiency anemia    Mild CAD    a. Coronary CT 04/2019 - Minimal, Non-obstructive CAD.   NICM (nonischemic cardiomyopathy) ([place])    Osteoarthritis 03/02/2019   PAF (paroxysmal atrial fibrillation) (HCC)    Pressure injury of skin 07/09/2019   Prolonged QT interval    Symptomatic anemia 06/28/2019    Past Surgical History:  Procedure Laterality Date   BIOPSY  06/29/2019   Procedure: BIOPSY;  Surgeon: Irene Shipper, MD;  Location: [place];  Service: Endoscopy;;   CARDIOVERSION N/A 04/29/2019   Procedure: CARDIOVERSION;  Surgeon: Sanda Klein, MD;  Location: [place];  Service: Cardiovascular;  Laterality: N/A;   CARDIOVERSION N/A  05/04/2019   Procedure: CARDIOVERSION;  Surgeon: Josue Hector, MD;  Location: [facility] ENDOSCOPY;  Service: Cardiovascular;  Laterality: N/A;   COLONOSCOPY WITH PROPOFOL N/A 07/12/2019   Procedure: COLONOSCOPY WITH PROPOFOL;  Surgeon: Doran Stabler, MD;  Location: [place];  Service: Gastroenterology;  Laterality: N/A;   ENTEROSCOPY N/A 07/10/2019   Procedure: ENTEROSCOPY;  Surgeon: Doran Stabler, MD;  Location: [place];  Service: Gastroenterology;   Laterality: N/A;   ESOPHAGOGASTRODUODENOSCOPY  06/29/2019   ESOPHAGOGASTRODUODENOSCOPY (EGD) WITH PROPOFOL N/A 06/29/2019   Procedure: ESOPHAGOGASTRODUODENOSCOPY (EGD) WITH PROPOFOL;  Surgeon: Irene Shipper, MD;  Location[facility] ENDOSCOPY;  Service: Endoscopy;  Laterality: N/A;   GIVENS CAPSULE STUDY N/A 07/12/2019   Procedure: GIVENS CAPSULE STUDY;  Surgeon: Doran Stabler, MD;  Location: [place];  Service: Gastroenterology;  Laterality: N/A;   HEMOSTASIS CLIP PLACEMENT  07/12/2019   Procedure: HEMOSTASIS CLIP PLACEMENT;  Surgeon: Doran Stabler, MD;  Location: [place];  Service: Gastroenterology;;   INTRAMEDULLARY (IM) NAIL INTERTROCHANTERIC Right 03/15/2021   Procedure: INTRAMEDULLARY (IM) NAIL INTERTROCHANTRIC;  Surgeon: Shona Needles, MD;  Location: [place];  Service: Orthopedics;  Laterality: Right;   POLYPECTOMY  07/12/2019   Procedure: POLYPECTOMY;  Surgeon: Doran Stabler, MD;  Location: [place];  Service: Gastroenterology;;   SMALL BOWEL ENTEROSCOPY  07/10/2019   SUBMUCOSAL TATTOO INJECTION  07/10/2019   Procedure: SUBMUCOSAL TATTOO INJECTION;  Surgeon: Doran Stabler, MD;  Location: [place];  Service: Gastroenterology;;   TEE WITHOUT CARDIOVERSION  04/29/2019   TEE WITHOUT CARDIOVERSION N/A 04/29/2019   Procedure: TRANSESOPHAGEAL ECHOCARDIOGRAM (TEE);  Surgeon: Sanda Klein, MD;  Location: [facility] ENDOSCOPY;  Service: Cardiovascular;  Laterality: N/A;   TUBAL LIGATION      Current Medications: No outpatient medications have been marked as taking for the 06/18/22 encounter (Appointment) with Richardo Priest, MD.     Allergies:   Ambien [zolpidem tartrate], Mobic [meloxicam], Adhesive [tape], and Codeine   Social History   Socioeconomic History   Marital status: Divorced    Spouse name: Not on file   Number of children: Not on file   Years of education: Not on file   Highest education level: Not on file  Occupational History   Not on file   Tobacco Use   Smoking status: Former    Packs/day: 2.00    Years: 30.00    Total pack years: 60.00    Types: Cigarettes    Quit date: 2001    Years since quitting: 22.8   Smokeless tobacco: Never  Vaping Use   Vaping Use: Never used  Substance and Sexual Activity   Alcohol use: Never   Drug use: Never   Sexual activity: Not Currently  Other Topics Concern   Not on file  Social History Narrative   Not on file   Social Determinants of Health   Financial Resource Strain: Not on file  Food Insecurity: Not on file  Transportation Needs: Not on file  Physical Activity: Not on file  Stress: Not on file  Social Connections: Not on file     Family History: The patient's ***family history includes Cancer in her mother; Heart disease in her father and mother; Hypercholesterolemia in her brother and mother; Osteoarthritis in her mother; Stroke in her mother. ROS:   Please see the history of present illness.    All other systems reviewed and are negative.  EKGs/Labs/Other Studies Reviewed:    The following studies were reviewed  today:  EKG:  EKG ordered today and personally reviewed.  The ekg ordered today demonstrates ***  Recent Labs: No results found for requested labs within last 365 days.  Recent Lipid Panel No results found for: "CHOL", "TRIG", "HDL", "CHOLHDL", "VLDL", "LDLCALC", "LDLDIRECT"  Physical Exam:    VS:  LMP  (LMP Unknown) Comment: ?10 years ago?    Wt Readings from Last 3 Encounters:  03/14/21 146 lb (66.2 kg)  03/16/20 (!) 210 lb (95.3 kg)  12/25/19 151 lb 0.2 oz (68.5 kg)     GEN: *** Well nourished, well developed in no acute distress HEENT: Normal NECK: No JVD; No carotid bruits LYMPHATICS: No lymphadenopathy CARDIAC: ***RRR, no murmurs, rubs, gallops RESPIRATORY:  Clear to auscultation without rales, wheezing or rhonchi  ABDOMEN: Soft, non-tender, non-distended MUSCULOSKELETAL:  No edema; No deformity  SKIN: Warm and dry NEUROLOGIC:   Alert and oriented x 3 PSYCHIATRIC:  Normal affect    Signed, Shirlee More, MD  06/17/2022 1:02 PM    [facility]

## 2022-06-18 ENCOUNTER — Ambulatory Visit: Payer: HMO | Admitting: Cardiology

## 2022-08-21 DIAGNOSIS — F3341 Major depressive disorder, recurrent, in partial remission: Secondary | ICD-10-CM | POA: Diagnosis not present

## 2022-08-21 DIAGNOSIS — I4892 Unspecified atrial flutter: Secondary | ICD-10-CM | POA: Diagnosis not present

## 2022-08-21 DIAGNOSIS — I10 Essential (primary) hypertension: Secondary | ICD-10-CM | POA: Diagnosis not present

## 2022-08-21 DIAGNOSIS — E7800 Pure hypercholesterolemia, unspecified: Secondary | ICD-10-CM | POA: Diagnosis not present

## 2022-08-21 DIAGNOSIS — E669 Obesity, unspecified: Secondary | ICD-10-CM | POA: Diagnosis not present

## 2022-08-21 DIAGNOSIS — F419 Anxiety disorder, unspecified: Secondary | ICD-10-CM | POA: Diagnosis not present

## 2022-08-21 DIAGNOSIS — I482 Chronic atrial fibrillation, unspecified: Secondary | ICD-10-CM | POA: Diagnosis not present

## 2022-08-21 DIAGNOSIS — J309 Allergic rhinitis, unspecified: Secondary | ICD-10-CM | POA: Diagnosis not present

## 2022-08-21 DIAGNOSIS — I5022 Chronic systolic (congestive) heart failure: Secondary | ICD-10-CM | POA: Diagnosis not present

## 2022-08-21 DIAGNOSIS — M159 Polyosteoarthritis, unspecified: Secondary | ICD-10-CM | POA: Diagnosis not present

## 2022-08-21 DIAGNOSIS — R7989 Other specified abnormal findings of blood chemistry: Secondary | ICD-10-CM | POA: Diagnosis not present

## 2022-08-21 DIAGNOSIS — R2681 Unsteadiness on feet: Secondary | ICD-10-CM | POA: Diagnosis not present

## 2022-08-24 DIAGNOSIS — R7989 Other specified abnormal findings of blood chemistry: Secondary | ICD-10-CM | POA: Diagnosis not present

## 2022-08-24 DIAGNOSIS — E1169 Type 2 diabetes mellitus with other specified complication: Secondary | ICD-10-CM | POA: Diagnosis not present

## 2022-08-24 DIAGNOSIS — F3341 Major depressive disorder, recurrent, in partial remission: Secondary | ICD-10-CM | POA: Diagnosis not present

## 2022-08-24 DIAGNOSIS — I482 Chronic atrial fibrillation, unspecified: Secondary | ICD-10-CM | POA: Diagnosis not present

## 2022-08-24 DIAGNOSIS — E7800 Pure hypercholesterolemia, unspecified: Secondary | ICD-10-CM | POA: Diagnosis not present

## 2022-08-24 DIAGNOSIS — L0390 Cellulitis, unspecified: Secondary | ICD-10-CM | POA: Diagnosis not present

## 2022-08-24 DIAGNOSIS — I4892 Unspecified atrial flutter: Secondary | ICD-10-CM | POA: Diagnosis not present

## 2022-08-24 DIAGNOSIS — M159 Polyosteoarthritis, unspecified: Secondary | ICD-10-CM | POA: Diagnosis not present

## 2022-08-24 DIAGNOSIS — E669 Obesity, unspecified: Secondary | ICD-10-CM | POA: Diagnosis not present

## 2022-08-24 DIAGNOSIS — F419 Anxiety disorder, unspecified: Secondary | ICD-10-CM | POA: Diagnosis not present

## 2022-08-24 DIAGNOSIS — I10 Essential (primary) hypertension: Secondary | ICD-10-CM | POA: Diagnosis not present

## 2022-08-24 DIAGNOSIS — J309 Allergic rhinitis, unspecified: Secondary | ICD-10-CM | POA: Diagnosis not present

## 2022-08-31 DIAGNOSIS — R5382 Chronic fatigue, unspecified: Secondary | ICD-10-CM | POA: Diagnosis not present

## 2022-09-10 DIAGNOSIS — I4892 Unspecified atrial flutter: Secondary | ICD-10-CM | POA: Diagnosis not present

## 2022-09-10 DIAGNOSIS — E1169 Type 2 diabetes mellitus with other specified complication: Secondary | ICD-10-CM | POA: Diagnosis not present

## 2022-09-10 DIAGNOSIS — M159 Polyosteoarthritis, unspecified: Secondary | ICD-10-CM | POA: Diagnosis not present

## 2022-09-10 DIAGNOSIS — J309 Allergic rhinitis, unspecified: Secondary | ICD-10-CM | POA: Diagnosis not present

## 2022-09-10 DIAGNOSIS — F419 Anxiety disorder, unspecified: Secondary | ICD-10-CM | POA: Diagnosis not present

## 2022-09-10 DIAGNOSIS — R2681 Unsteadiness on feet: Secondary | ICD-10-CM | POA: Diagnosis not present

## 2022-09-10 DIAGNOSIS — R7989 Other specified abnormal findings of blood chemistry: Secondary | ICD-10-CM | POA: Diagnosis not present

## 2022-09-10 DIAGNOSIS — I482 Chronic atrial fibrillation, unspecified: Secondary | ICD-10-CM | POA: Diagnosis not present

## 2022-09-10 DIAGNOSIS — F3341 Major depressive disorder, recurrent, in partial remission: Secondary | ICD-10-CM | POA: Diagnosis not present

## 2022-09-10 DIAGNOSIS — E669 Obesity, unspecified: Secondary | ICD-10-CM | POA: Diagnosis not present

## 2022-09-10 DIAGNOSIS — I10 Essential (primary) hypertension: Secondary | ICD-10-CM | POA: Diagnosis not present

## 2022-09-10 DIAGNOSIS — E7800 Pure hypercholesterolemia, unspecified: Secondary | ICD-10-CM | POA: Diagnosis not present

## 2022-09-18 DIAGNOSIS — F3341 Major depressive disorder, recurrent, in partial remission: Secondary | ICD-10-CM | POA: Diagnosis not present

## 2022-09-18 DIAGNOSIS — E7800 Pure hypercholesterolemia, unspecified: Secondary | ICD-10-CM | POA: Diagnosis not present

## 2022-09-18 DIAGNOSIS — M159 Polyosteoarthritis, unspecified: Secondary | ICD-10-CM | POA: Diagnosis not present

## 2022-09-18 DIAGNOSIS — I10 Essential (primary) hypertension: Secondary | ICD-10-CM | POA: Diagnosis not present

## 2022-09-18 DIAGNOSIS — F419 Anxiety disorder, unspecified: Secondary | ICD-10-CM | POA: Diagnosis not present

## 2022-09-18 DIAGNOSIS — E669 Obesity, unspecified: Secondary | ICD-10-CM | POA: Diagnosis not present

## 2022-09-18 DIAGNOSIS — E1169 Type 2 diabetes mellitus with other specified complication: Secondary | ICD-10-CM | POA: Diagnosis not present

## 2022-09-18 DIAGNOSIS — R2681 Unsteadiness on feet: Secondary | ICD-10-CM | POA: Diagnosis not present

## 2022-09-18 DIAGNOSIS — I482 Chronic atrial fibrillation, unspecified: Secondary | ICD-10-CM | POA: Diagnosis not present

## 2022-09-18 DIAGNOSIS — I4892 Unspecified atrial flutter: Secondary | ICD-10-CM | POA: Diagnosis not present

## 2022-09-18 DIAGNOSIS — J309 Allergic rhinitis, unspecified: Secondary | ICD-10-CM | POA: Diagnosis not present

## 2022-09-18 DIAGNOSIS — R7989 Other specified abnormal findings of blood chemistry: Secondary | ICD-10-CM | POA: Diagnosis not present

## 2022-09-27 ENCOUNTER — Encounter (HOSPITAL_COMMUNITY): Payer: Self-pay | Admitting: *Deleted

## 2022-12-31 DIAGNOSIS — I4430 Unspecified atrioventricular block: Secondary | ICD-10-CM | POA: Diagnosis not present

## 2022-12-31 DIAGNOSIS — I4891 Unspecified atrial fibrillation: Secondary | ICD-10-CM | POA: Diagnosis not present

## 2022-12-31 DIAGNOSIS — A419 Sepsis, unspecified organism: Secondary | ICD-10-CM

## 2022-12-31 DIAGNOSIS — I4892 Unspecified atrial flutter: Secondary | ICD-10-CM | POA: Diagnosis not present

## 2022-12-31 DIAGNOSIS — J189 Pneumonia, unspecified organism: Secondary | ICD-10-CM | POA: Diagnosis not present

## 2022-12-31 DIAGNOSIS — J9601 Acute respiratory failure with hypoxia: Secondary | ICD-10-CM | POA: Diagnosis not present

## 2023-01-01 DIAGNOSIS — I4892 Unspecified atrial flutter: Secondary | ICD-10-CM | POA: Diagnosis not present

## 2023-01-01 DIAGNOSIS — J9601 Acute respiratory failure with hypoxia: Secondary | ICD-10-CM | POA: Diagnosis not present

## 2023-01-01 DIAGNOSIS — J189 Pneumonia, unspecified organism: Secondary | ICD-10-CM | POA: Diagnosis not present

## 2023-01-01 DIAGNOSIS — I4430 Unspecified atrioventricular block: Secondary | ICD-10-CM | POA: Diagnosis not present

## 2023-01-01 DIAGNOSIS — I4891 Unspecified atrial fibrillation: Secondary | ICD-10-CM | POA: Diagnosis not present

## 2023-01-02 DIAGNOSIS — J9601 Acute respiratory failure with hypoxia: Secondary | ICD-10-CM | POA: Diagnosis not present

## 2023-01-02 DIAGNOSIS — I509 Heart failure, unspecified: Secondary | ICD-10-CM

## 2023-01-02 DIAGNOSIS — I361 Nonrheumatic tricuspid (valve) insufficiency: Secondary | ICD-10-CM | POA: Diagnosis not present

## 2023-01-02 DIAGNOSIS — I4891 Unspecified atrial fibrillation: Secondary | ICD-10-CM | POA: Diagnosis not present

## 2023-01-02 DIAGNOSIS — I4892 Unspecified atrial flutter: Secondary | ICD-10-CM | POA: Diagnosis not present

## 2023-01-02 DIAGNOSIS — J189 Pneumonia, unspecified organism: Secondary | ICD-10-CM | POA: Diagnosis not present

## 2023-01-03 DIAGNOSIS — I509 Heart failure, unspecified: Secondary | ICD-10-CM

## 2023-01-03 DIAGNOSIS — I4892 Unspecified atrial flutter: Secondary | ICD-10-CM

## 2023-01-03 DIAGNOSIS — I4891 Unspecified atrial fibrillation: Secondary | ICD-10-CM

## 2023-01-03 DIAGNOSIS — A419 Sepsis, unspecified organism: Secondary | ICD-10-CM

## 2023-01-03 DIAGNOSIS — J189 Pneumonia, unspecified organism: Secondary | ICD-10-CM

## 2023-01-03 DIAGNOSIS — I491 Atrial premature depolarization: Secondary | ICD-10-CM

## 2023-01-03 DIAGNOSIS — J9601 Acute respiratory failure with hypoxia: Secondary | ICD-10-CM

## 2023-01-04 DIAGNOSIS — I4891 Unspecified atrial fibrillation: Secondary | ICD-10-CM | POA: Diagnosis not present

## 2023-01-04 DIAGNOSIS — J189 Pneumonia, unspecified organism: Secondary | ICD-10-CM | POA: Diagnosis not present

## 2023-01-04 DIAGNOSIS — J9601 Acute respiratory failure with hypoxia: Secondary | ICD-10-CM | POA: Diagnosis not present

## 2023-01-04 DIAGNOSIS — I4892 Unspecified atrial flutter: Secondary | ICD-10-CM | POA: Diagnosis not present

## 2023-01-05 DIAGNOSIS — J449 Chronic obstructive pulmonary disease, unspecified: Secondary | ICD-10-CM

## 2023-01-05 DIAGNOSIS — I4892 Unspecified atrial flutter: Secondary | ICD-10-CM

## 2023-01-22 ENCOUNTER — Encounter: Payer: Self-pay | Admitting: Cardiology

## 2023-01-25 DIAGNOSIS — I4892 Unspecified atrial flutter: Secondary | ICD-10-CM | POA: Diagnosis not present

## 2023-04-13 DIAGNOSIS — I4891 Unspecified atrial fibrillation: Secondary | ICD-10-CM | POA: Insufficient documentation

## 2023-09-26 ENCOUNTER — Ambulatory Visit: Payer: PPO | Admitting: Cardiology

## 2023-09-26 ENCOUNTER — Other Ambulatory Visit: Payer: Self-pay

## 2023-09-26 DIAGNOSIS — I480 Paroxysmal atrial fibrillation: Secondary | ICD-10-CM | POA: Insufficient documentation

## 2023-09-26 DIAGNOSIS — I5022 Chronic systolic (congestive) heart failure: Secondary | ICD-10-CM | POA: Insufficient documentation

## 2023-09-30 NOTE — Progress Notes (Deleted)
 Cardiology Office Note:    Date:  09/30/2023   ID:  Brianna Porter, DOB 1961/05/14, MRN [id number]  PCP:  Brianna Curia, MD  Cardiologist:  Brianna Herrlich, MD    Referring MD: Brianna Curia, MD    ASSESSMENT:    No diagnosis found. PLAN:    In order of problems listed above:  ***   Next appointment: ***   Medication Adjustments/Labs and Tests Ordered: Current medicines are reviewed at length with the patient today.  Concerns regarding medicines are outlined above.  No orders of the defined types were placed in this encounter.  No orders of the defined types were placed in this encounter.    History of Present Illness:    Brianna Porter is a 63 y.o. 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  Had an atrium admission to the hospital pneumonia respiratory failure rapid atrial fibrillation they attempted cardioversion that was unsuccessful.  There is a notation she had an echocardiogram with a normal ejection fraction and at heart failure treated with with IV diuretic.  09/17/2023: SUMMARY  Uncontrolled irregular heart rhythm. This will reduce the sensitivity-specificity of the echo  findings.  Left ventricular systolic function is normal.  LV ejection fraction = 55-60%.  No segmental wall motion abnormalities seen in the left ventricle  Left atrial pressure is indeterminate due to  atrial fibrillation  The left atrium is mildly dilated.  There is no significant valvular stenosis or regurgitation.  There is no pericardial effusion.   Compliance with diet, lifestyle and medications: *** Past Medical History:  Diagnosis Date   Acute gastric ulcer with hemorrhage    Acute systolic heart failure (HCC) 05/07/2019   AF (paroxysmal atrial fibrillation) (HCC)    AKI (acute  kidney injury) (HCC) 05/07/2019   Altered mental status 05/07/2019   Anemia 04/23/2019   Anxiety and depression    Atrial fibrillation with rapid ventricular response (HCC) 04/13/2023   Atrial flutter (HCC)    CAD (coronary artery disease)    Chronic combined systolic (congestive) and diastolic (congestive) heart failure (HCC)    a. dx in setting of atrial fib/flutter - possibly tachy mediated. Coronary CTA with only mild CAD in 04/2019.     Chronic kidney disease, stage 3b (HCC) 04/23/2019   Chronic systolic CHF (congestive heart failure) (HCC)    a. dx in setting of atrial fib/flutter - possibly tachy mediated. Coronary CTA with only mild CAD in 04/2019.   CKD (chronic kidney disease) stage 3, GFR 30-59 ml/min (HCC) 04/23/2019   Closed right hip fracture (HCC) 03/14/2021   Confusion    a. persistent confusion during 04/2019 admission of unclear cause. Home meds adjusted. CT/MRI brain nonacute.   Convulsions (HCC) 03/06/2020   Depression 03/02/2019   Diabetes (HCC)    Edema    Elevated liver function tests    Encephalopathy 03/05/2020   Essential hypertension    Fall 12/23/2019   Falls    Gastrointestinal hemorrhage    Generalized anxiety disorder 03/14/2021   Hypercholesteremia    Hyperkalemia    Hyperlipidemia 04/23/2019   Hypertensive heart and chronic kidney disease with systolic congestive heart failure (HCC)    Hypertensive heart disease 03/02/2019   Hyperthyroidism    Hypokalemia    Hypokalemia    Hypomagnesemia    Hyponatremia  Hypothyroidism 04/15/2020   Iron deficiency anemia    Leukocytosis 03/14/2021   Mild CAD    a. Coronary CT 04/2019 - Minimal, Non-obstructive CAD.   Mixed diabetic hyperlipidemia associated with type 2 diabetes mellitus (HCC) 03/14/2021   NICM (nonischemic cardiomyopathy) (HCC)    Osteoarthritis 03/02/2019   PAF (paroxysmal atrial fibrillation) (HCC)    Persistent atrial fibrillation (HCC)    Pressure injury of skin 07/09/2019   Prolonged  QT interval    Secondary adrenal insufficiency (HCC) 04/15/2020   Symptomatic anemia 06/28/2019   Type 2 diabetes mellitus with stage 3b chronic kidney disease, without long-term current use of insulin (HCC) 03/02/2019   Vitamin D deficiency 04/15/2020    Current Medications: No outpatient medications have been marked as taking for the 10/01/23 encounter (Appointment) with Baldo Daub, MD.      EKGs/Labs/Other Studies Reviewed:    The following studies were reviewed today:  Cardiac Studies & Procedures      ECHOCARDIOGRAM  ECHOCARDIOGRAM COMPLETE 03/08/2020  Narrative ECHOCARDIOGRAM REPORT    Patient Name:   Brianna Porter Date of Exam: 03/08/2020 Medical Rec #:  [id number]       Height:       62.0 in Accession #:    [id number]      Weight:       159.4 lb Date of Birth:  07-05-1961       BSA:          1.736 m Patient Age:    63 years        BP:           158/79 mmHg Patient Gender: F               HR:           59 bpm. Exam Location:  Inpatient  Procedure: 2D Echo  Indications:    cardiomyopathy  History:        Patient has prior history of Echocardiogram examinations, most recent 04/29/2019. CHF and Cardiomyopathy, CAD, Arrythmias:Atrial Flutter; Risk Factors:Diabetes, Hypertension, Dyslipidemia and Former Smoker. NICM.  Sonographer:    Celene Skeen RDCS (AE) Referring Phys: 45 DAYNA N DUNN   Sonographer Comments: Image acquisition challenging due to patient body habitus. IMPRESSIONS   1. EF now 45-50% compared with prior. Prior echo was in Afib with RVR ~150 bpm. 2. Left ventricular ejection fraction, by estimation, is 45 to 50%. The left ventricle has mildly decreased function. The left ventricle demonstrates global hypokinesis. Left ventricular diastolic parameters are consistent with Grade II diastolic dysfunction (pseudonormalization). Elevated left atrial pressure. 3. Right ventricular systolic function is mildly reduced. The right ventricular size is  normal. Tricuspid regurgitation signal is inadequate for assessing PA pressure. 4. The mitral valve is grossly normal. Trivial mitral valve regurgitation. No evidence of mitral stenosis. 5. The aortic valve is grossly normal. Aortic valve regurgitation is not visualized. No aortic stenosis is present.  Comparison(s): Changes from prior study are noted. The left ventricular function has improved.  FINDINGS Left Ventricle: Left ventricular ejection fraction, by estimation, is 45 to 50%. The left ventricle has mildly decreased function. The left ventricle demonstrates global hypokinesis. The left ventricular internal cavity size was normal in size. There is no left ventricular hypertrophy. Left ventricular diastolic parameters are consistent with Grade II diastolic dysfunction (pseudonormalization). Elevated left atrial pressure.  Right Ventricle: The right ventricular size is normal. No increase in right ventricular wall thickness. Right ventricular systolic function is mildly reduced. Tricuspid regurgitation signal  is inadequate for assessing PA pressure.  Left Atrium: Left atrial size was normal in size.  Right Atrium: Right atrial size was normal in size.  Pericardium: Trivial pericardial effusion is present. Presence of pericardial fat pad.  Mitral Valve: The mitral valve is grossly normal. Trivial mitral valve regurgitation. No evidence of mitral valve stenosis.  Tricuspid Valve: The tricuspid valve is grossly normal. Tricuspid valve regurgitation is not demonstrated. No evidence of tricuspid stenosis.  Aortic Valve: The aortic valve is grossly normal. Aortic valve regurgitation is not visualized. No aortic stenosis is present.  Pulmonic Valve: The pulmonic valve was grossly normal. Pulmonic valve regurgitation is not visualized. No evidence of pulmonic stenosis.  Aorta: The aortic root is normal in size and structure.  Venous: The inferior vena cava was not well  visualized.  IAS/Shunts: The atrial septum is grossly normal.   LEFT VENTRICLE PLAX 2D LVIDd:         4.20 cm  Diastology LVIDs:         2.10 cm  LV e' lateral:   9.46 cm/s LV PW:         1.50 cm  LV E/e' lateral: 10.2 LV IVS:        1.00 cm  LV e' medial:    6.09 cm/s LVOT diam:     1.90 cm  LV E/e' medial:  15.8 LV SV:         55 LV SV Index:   32 LVOT Area:     2.84 cm   LEFT ATRIUM           Index LA diam:      3.40 cm 1.96 cm/m LA Vol (A4C): 25.7 ml 14.80 ml/m AORTIC VALVE LVOT Vmax:   84.90 cm/s LVOT Vmean:  56.200 cm/s LVOT VTI:    0.193 m  AORTA Ao Root diam: 2.70 cm  MITRAL VALVE MV Area (PHT): 3.37 cm    SHUNTS MV Decel Time: 225 msec    Systemic VTI:  0.19 m MV E velocity: 96.40 cm/s  Systemic Diam: 1.90 cm MV A velocity: 50.10 cm/s MV E/A ratio:  1.92  Lennie Odor MD Electronically signed by Lennie Odor MD Signature Date/Time: 03/08/2020/1:52:13 PM    Final  TEE  ECHO TEE 04/29/2019  Narrative TRANSESOPHOGEAL ECHO REPORT    Patient Name:   SUI KASPAREK Date of Exam: 04/29/2019 Medical Rec #:  [id number]       Height:       63.0 in Accession #:    [id number]      Weight:       139.0 lb Date of Birth:  12/21/1960       BSA:          1.66 m Patient Age:    57 years        BP:           100/72 mmHg Patient Gender: F               HR:           161 bpm. Exam Location:  Inpatient   Procedure: Transesophageal Echo, Cardiac Doppler and Color Doppler  Indications:     I48.92* Unspecified atrial flutter  History:         Patient has no prior history of Echocardiogram examinations. Atrial Flutter Risk Factors: Diabetes and Dyslipidemia. Chronic kidney disorder.  Sonographer:     Sheralyn Boatman RDCS Referring Phys:  [id number] [facility] Diagnosing Phys: Thurmon Fair MD  PROCEDURE: The transesophogeal probe was passed through the esophogus of the patient. The patient developed no complications during the procedure.  IMPRESSIONS   1. The  left ventricle has severely reduced systolic function, with an ejection fraction of 20-25%. The cavity size was normal. Left ventrical global hypokinesis without regional wall motion abnormalities. 2. The right ventricle has severely reduced systolic function. The cavity was normal. There is no increase in right ventricular wall thickness. Right ventricular systolic pressure is normal. 3. No evidence of a thrombus present in the left atrial appendage. 4. Mild mitral insufficiency. The jet is centrally-directed. 5. The aortic root, ascending aorta, aortic arch and descending aorta are normal in size and structure. 6. No intracardiac thrombi or masses were visualized.  FINDINGS Left Ventricle: The left ventricle has severely reduced systolic function, with an ejection fraction of 20-25%. The cavity size was normal. There is no increase in left ventricular wall thickness. Left ventrical global hypokinesis without regional wall motion abnormalities.  Right Ventricle: The right ventricle has severely reduced systolic function. The cavity was normal. There is no increase in right ventricular wall thickness. Right ventricular systolic pressure is normal.  Left Atrium: Left atrial size was normal in size.   Left Atrial Appendage: No evidence of a thrombus present in the left atrial appendage.  Right Atrium: Right atrial size was normal in size.  Interatrial Septum: No atrial level shunt detected by color flow Doppler.  Pericardium: There is no evidence of pericardial effusion.  Mitral Valve: The mitral valve is normal in structure. Mitral valve regurgitation is mild by color flow Doppler. The MR jet is centrally-directed.  Tricuspid Valve: The tricuspid valve was normal in structure. Tricuspid valve regurgitation is trivial by color flow Doppler.  Aortic Valve: The aortic valve is normal in structure. Aortic valve regurgitation was not visualized by color flow Doppler.  Pulmonic Valve: The  pulmonic valve was normal in structure. Pulmonic valve regurgitation is not visualized by color flow Doppler.  Aorta: The aortic root, ascending aorta and aortic arch are normal in size and structure.   +---------------+---------++ RIGHT VENTRICLE          +---------------+---------++ RVSP:          21.2 mmHg +---------------+---------++  +------------+---------+++ RIGHT ATRIUM          +------------+---------+++ RA Pressure:8.00 mmHg +------------+---------+++ +---------------+-----------++ TRICUSPID VALVE            +---------------+-----------++ TR Peak grad:  13.2 mmHg   +---------------+-----------++ TR Vmax:       182.00 cm/s +---------------+-----------++ Estimated RAP: 8.00 mmHg   +---------------+-----------++ RVSP:          21.2 mmHg   +---------------+-----------++   Thurmon Fair MD Electronically signed by Thurmon Fair MD Signature Date/Time: 04/29/2019/3:37:07 PM    Final   CT SCANS  CT CORONARY MORPH W/CTA COR W/SCORE 05/05/2019  Addendum 05/06/2019  5:19 PM ADDENDUM REPORT: 05/06/2019 17:17  CLINICAL DATA:  Chest pain  EXAM: Cardiac/Coronary CTA  TECHNIQUE: The patient was scanned on a [employer]. A 100 kV prospective scan was triggered in the descending thoracic aorta at 111 HU's. Axial non-contrast 3 mm slices were carried out through the heart. The data set was analyzed on a dedicated work station and scored using the Agatson method. Gantry rotation speed was 250 msecs and collimation was .6 mm. No beta blockade and 0.8 mg of sl NTG was given. The 3D data set was reconstructed in 5% intervals of the 35-75 % of the R-R  cycle. Diastolic phases were analyzed on a dedicated work station using MPR, MIP and VRT modes. The patient received 80 cc of contrast.  FINDINGS: Image quality: Average.  Noise artifact is: There is respiratory motion artifact noted near the sinotubular junction that does  not limit evaluation of this study.  Coronary Arteries:  Normal coronary origin.  Right dominance.  Left main: The left main is a large caliber vessel with a normal take off from the left coronary cusp that bifurcates to form a left anterior descending artery and a left circumflex artery. There is no plaque or stenosis.  Left anterior descending artery: The proximal LAD contains minimal calcified plaque (<25%). The mid and distal segments are patent without evidence of plaque or stenosis. The LAD gives off 2 patent diagonal branches.  Left circumflex artery: The LCX is non-dominant. The proximal LCX contains minimal calcified plaque (<25%). The LCX gives off 1 large patent obtuse marginal branch. The PDA terminates as a small OM branch.  Right coronary artery: The RCA is dominant with normal take off from the right coronary cusp. The RCA is patent without evidence of plaque or stenosis. The RCA terminates as a PDA and right posterolateral branch without evidence of plaque or stenosis.  Right Atrium: Right atrial size is within normal limits.  Right Ventricle: The right ventricular cavity is within normal limits.  Left Atrium: Left atrial size is dilated. There is no left atrial appendage filling defect.  Left Ventricle: The ventricular cavity size is within normal limits. There are no stigmata of prior infarction. There is no abnormal filling defect.  Pulmonary arteries: Normal in size without proximal filling defect.  Pulmonary veins: Normal pulmonary venous drainage.  Pericardium: Normal thickness with no significant effusion or calcium present.  Cardiac valves: The aortic valve is trileaflet without significant calcification. The mitral valve is normal structure without significant calcification.  Aorta: Normal caliber. There is mild calcified plaque noted in the aortic root/ascending aorta/descending aorta.  Extra-cardiac findings: See attached radiology report  for non-cardiac structures.  IMPRESSION: 1. Coronary calcium score of 51. This was 90 percentile for age and sex matched control.  2. Normal coronary origin with right dominance.  3. Minimal, Non-obstructive CAD (<25%).  4. Atherosclerosis noted in the aortic root/ascending/descending aorta.  RECOMMENDATIONS: 1. CAD-RADS 1: Minimal non-obstructive CAD (0-24%). Consider non-atherosclerotic causes of chest pain. Consider preventive therapy and risk factor modification.  Lennie Odor, MD   Electronically Signed By: Lennie Odor On: 05/06/2019 17:17  Narrative EXAM: OVER-READ INTERPRETATION CT CHEST  The following report is an over-read performed by radiologist Dr. Hulan Saas of [facility] on 05/05/2019. This over-read does not include interpretation of cardiac or coronary anatomy or pathology. The coronary calcium score/coronary CTA interpretation by the cardiologist is attached.  COMPARISON:  None.  FINDINGS: Vascular: Mild to moderate atherosclerosis involving the visualized thoracic aorta without evidence of aneurysm.  Mediastinum/Nodes: No pathologic lymphadenopathy within the visualized mediastinum. Visualized esophagus normal in appearance.  Lungs/Pleura: Visualized lung parenchyma clear. Central bronchi patent without significant bronchial wall thickening. No pleural effusions.  Upper Abdomen: Unremarkable for the early arterial phase of enhancement which accounts for the heterogeneous appearance of the spleen.  Musculoskeletal: Minimal degenerative changes involving the visualized thoracic spine.  IMPRESSION: Aortic Atherosclerosis (ICD10-I70.0).  No acute or significant extracardiac findings otherwise.  Electronically Signed: By: Hulan Saas M.D. On: 05/05/2019 19:23              Recent Labs: No results found  for requested labs within last 365 days.  Recent Lipid Panel No results found for: "CHOL", "TRIG", "HDL",  "CHOLHDL", "VLDL", "LDLCALC", "LDLDIRECT"  Physical Exam:    VS:  LMP  (LMP Unknown) Comment: ?10 years ago?    Wt Readings from Last 3 Encounters:  03/14/21 146 lb (66.2 kg)  03/16/20 (!) 210 lb (95.3 kg)  12/25/19 151 lb 0.2 oz (68.5 kg)     GEN: *** Well nourished, well developed in no acute distress HEENT: Normal NECK: No JVD; No carotid bruits LYMPHATICS: No lymphadenopathy CARDIAC: ***RRR, no murmurs, rubs, gallops RESPIRATORY:  Clear to auscultation without rales, wheezing or rhonchi  ABDOMEN: Soft, non-tender, non-distended MUSCULOSKELETAL:  No edema; No deformity  SKIN: Warm and dry NEUROLOGIC:  Alert and oriented x 3 PSYCHIATRIC:  Normal affect    Signed, Brianna Herrlich, MD  09/30/2023 3:11 PM    [facility]

## 2023-10-01 ENCOUNTER — Ambulatory Visit: Payer: PPO | Admitting: Cardiology

## 2023-10-03 NOTE — Progress Notes (Signed)
Cardiology Office Note:    Date:  10/04/2023   ID:  Brianna Porter, DOB 1961/03/13, MRN [id number]  PCP:  Simone Curia, MD  Cardiologist:  Norman Herrlich, MD    Referring MD: Simone Curia, MD    ASSESSMENT:    1. Persistent atrial fibrillation (HCC)   2. Hypertensive heart disease with chronic diastolic congestive heart failure (HCC)   3. Chronic anticoagulation   4. Mild CAD   5. Mixed hyperlipidemia    PLAN:    In order of problems listed above:  She has chronic atrial fibrillation unable to be cardioverted in the hospital I would advise to continue her anticoagulant beta-blocker calcium channel blocker would not pursue further cardioversion and certainly at this time catheter ablation. Stable BP at target I did ask her to take her diuretic twice daily I think will help her with her shortness of breath and intermittent hypoxemia off oxygen Stable having no anginal discomfort continue antianginal medications beta-blocker calcium channel blocker and her high intensity statin She has upcoming labs with her PCP on the last check a proBNP level   Next appointment: 3 months   Medication Adjustments/Labs and Tests Ordered: Current medicines are reviewed at length with the patient today.  Concerns regarding medicines are outlined above.  Orders Placed This Encounter  Procedures   EKG 12-Lead   Meds ordered this encounter  Medications   furosemide (LASIX) 20 MG tablet    Sig: Take 1 tablet (20 mg total) by mouth 2 (two) times daily.    Dispense:  180 tablet    Refill:  3     History of Present Illness:    Brianna Porter is a 63 y.o. female with a hx of atrial fibrillation with amiodarone induced  thyrotoxicosis and adrenal insufficiency severe cardiomyopathy EF 20 to 25% improving to 40 to 45% nonobstructive CAD adrenal insufficiency stage III CKD last seen in August 2021.  She had an [facility] admission to the hospital pneumonia respiratory failure rapid atrial fibrillation  they attempted cardioversion that was unsuccessful.  There is a notation she had an echocardiogram with a normal ejection fraction and at heart failure treated with with IV diuretic.  09/17/2023: SUMMARY  Uncontrolled irregular heart rhythm. This will reduce the sensitivity-specificity of the echo  findings.  Left ventricular systolic function is normal.  LV ejection fraction = 55-60%.  No segmental wall motion abnormalities seen in the left ventricle  Left atrial pressure is indeterminate due to  atrial fibrillation  The left atrium is mildly dilated.  There is no significant valvular stenosis or regurgitation.  There is no pericardial effusion.   Compliance with diet, lifestyle and medications: Yes  Been taking her diuretic once a day she notices that she takes her oxygen off sats are less than 90 and I asked her to take her diuretic twice daily every day She has a mild degree of edema takes a high dose calcium channel blocker She is slowly and steadily improving no chest pain palpitation or syncope Her heart rates at home are in the range of 70 to 80 bpm Past Medical History:  Diagnosis Date   Acute gastric ulcer with hemorrhage    Acute systolic heart failure (HCC) 05/07/2019   AF (paroxysmal atrial fibrillation) (HCC)    AKI (acute kidney injury) (HCC) 05/07/2019   Altered mental status 05/07/2019   Anemia 04/23/2019   Anxiety and depression    Atrial fibrillation with rapid ventricular response (HCC) 04/13/2023   Atrial flutter (  HCC)    CAD (coronary artery disease)    Chronic combined systolic (congestive) and diastolic (congestive) heart failure (HCC)    a. dx in setting of atrial fib/flutter - possibly tachy mediated. Coronary CTA with only mild CAD in 04/2019.     Chronic kidney disease, stage 3b (HCC) 04/23/2019   Chronic systolic CHF (congestive heart failure) (HCC)    a. dx in setting of atrial fib/flutter - possibly tachy mediated. Coronary CTA with only mild CAD in  04/2019.   CKD (chronic kidney disease) stage 3, GFR 30-59 ml/min (HCC) 04/23/2019   Closed right hip fracture (HCC) 03/14/2021   Confusion    a. persistent confusion during 04/2019 admission of unclear cause. Home meds adjusted. CT/MRI brain nonacute.   Convulsions (HCC) 03/06/2020   Depression 03/02/2019   Diabetes (HCC)    Edema    Elevated liver function tests    Encephalopathy 03/05/2020   Essential hypertension    Fall 12/23/2019   Falls    Gastrointestinal hemorrhage    Generalized anxiety disorder 03/14/2021   Hypercholesteremia    Hyperkalemia    Hyperlipidemia 04/23/2019   Hypertensive heart and chronic kidney disease with systolic congestive heart failure (HCC)    Hypertensive heart disease 03/02/2019   Hyperthyroidism    Hypokalemia    Hypokalemia    Hypomagnesemia    Hyponatremia    Hypothyroidism 04/15/2020   Iron deficiency anemia    Leukocytosis 03/14/2021   Mild CAD    a. Coronary CT 04/2019 - Minimal, Non-obstructive CAD.   Mixed diabetic hyperlipidemia associated with type 2 diabetes mellitus (HCC) 03/14/2021   NICM (nonischemic cardiomyopathy) (HCC)    Osteoarthritis 03/02/2019   PAF (paroxysmal atrial fibrillation) (HCC)    Persistent atrial fibrillation (HCC)    Pressure injury of skin 07/09/2019   Prolonged QT interval    Secondary adrenal insufficiency (HCC) 04/15/2020   Symptomatic anemia 06/28/2019   Type 2 diabetes mellitus with stage 3b chronic kidney disease, without long-term current use of insulin (HCC) 03/02/2019   Vitamin D deficiency 04/15/2020    Current Medications: Current Meds  Medication Sig   acetaminophen (TYLENOL) 325 MG tablet Take 650 mg by mouth every 6 (six) hours as needed for mild pain.   ALPRAZolam (XANAX) 0.5 MG tablet Take 0.5 mg by mouth 4 (four) times daily.   apixaban (ELIQUIS) 5 MG TABS tablet Take 1 tablet (5 mg total) by mouth 2 (two) times daily.   baclofen (LIORESAL) 10 MG tablet Take 10 mg by mouth 2 (two)  times daily as needed for muscle spasms.   dapagliflozin propanediol (FARXIGA) 10 MG TABS tablet Take 1 tablet by mouth daily.   diltiazem (TIAZAC) 360 MG 24 hr capsule Take 360 mg by mouth daily.   fenofibrate (TRICOR) 48 MG tablet Take 48 mg by mouth daily.   furosemide (LASIX) 20 MG tablet Take 1 tablet (20 mg total) by mouth 2 (two) times daily.   hydrocortisone (CORTEF) 10 MG tablet Take 10 mg by mouth 2 (two) times daily.   levETIRAcetam (KEPPRA) 500 MG tablet Take 500 mg by mouth 2 (two) times daily.   magnesium oxide (MAG-OX) 400 MG tablet Take 400 mg by mouth daily.   metFORMIN (GLUCOPHAGE) 1000 MG tablet Take 1,000 mg by mouth daily as needed (sugar).   metoprolol succinate (TOPROL-XL) 100 MG 24 hr tablet Take 1 tablet (100 mg total) by mouth daily. Take with or immediately following a meal.   Multiple Vitamin (MULTIVITAMIN) tablet Take 1  tablet by mouth daily.   Multiple Vitamins-Minerals (HAIR SKIN & NAILS PO) Take 1 tablet by mouth daily.   pantoprazole (PROTONIX) 40 MG tablet Take 40 mg by mouth 2 (two) times daily.    pregabalin (LYRICA) 100 MG capsule Take 100 mg by mouth 2 (two) times daily.   promethazine (PHENERGAN) 25 MG tablet Take 25 mg by mouth every 6 (six) hours as needed for nausea or vomiting.    rosuvastatin (CRESTOR) 10 MG tablet Take 1 tablet by mouth daily.   sertraline (ZOLOFT) 25 MG tablet Take 1 tablet by mouth daily.   traMADol (ULTRAM) 50 MG tablet Take 50 mg by mouth every 6 (six) hours as needed for moderate pain (pain score 4-6) or severe pain (pain score 7-10).   [DISCONTINUED] furosemide (LASIX) 20 MG tablet Take 20 mg by mouth 2 (two) times daily as needed for edema or fluid.      EKGs/Labs/Other Studies Reviewed:    The following studies were reviewed today:     EKG Interpretation Date/Time:  Friday October 04 2023 78:29:56 EST Ventricular Rate:  127 PR Interval:    QRS Duration:  86 QT Interval:  310 QTC Calculation: 450 R  Axis:   28  Text Interpretation: Atrial fibrillation with rapid ventricular response Nonspecific ST and T wave abnormality When compared with ECG of 14-Mar-2021 20:17, She remains in atrial fibrillation but the rate is significantly faster. Confirmed by Norman Herrlich ([id number]) on 10/04/2023 4:31:47 PM   Recent Labs: [facility] 09/21/2023: Hemoglobin 9.8 platelets 281,000 sodium 139 potassium 4.0 creatinine 0.85 GFR 77 cc/min 09/16/2023 BNP level 265 10 days previously 559. Physical Exam:    VS:  BP 136/68   Pulse (!) 127   Ht 5\' 3"  (1.6 m)   Wt 148 lb 6.4 oz (67.3 kg)   LMP  (LMP Unknown) Comment: ?10 years ago?  SpO2 96%   BMI 26.29 kg/m     Wt Readings from Last 3 Encounters:  10/04/23 148 lb 6.4 oz (67.3 kg)  03/14/21 146 lb (66.2 kg)  03/16/20 (!) 210 lb (95.3 kg)     GEN:  Well nourished, well developed in no acute distress HEENT: Normal NECK: No JVD; No carotid bruits LYMPHATICS: No lymphadenopathy CARDIAC: RRR, no murmurs, rubs, gallops RESPIRATORY:  Clear to auscultation without rales, wheezing or rhonchi  ABDOMEN: Soft, non-tender, non-distended MUSCULOSKELETAL:  No edema; No deformity  SKIN: Warm and dry NEUROLOGIC:  Alert and oriented x 3 PSYCHIATRIC:  Normal affect    Signed, Norman Herrlich, MD  10/04/2023 4:47 PM     [facility]

## 2023-10-04 ENCOUNTER — Ambulatory Visit: Payer: PPO | Attending: Cardiology | Admitting: Cardiology

## 2023-10-04 ENCOUNTER — Encounter: Payer: Self-pay | Admitting: Cardiology

## 2023-10-04 VITALS — BP 136/68 | HR 127 | Ht 63.0 in | Wt 148.4 lb

## 2023-10-04 DIAGNOSIS — I4819 Other persistent atrial fibrillation: Secondary | ICD-10-CM

## 2023-10-04 DIAGNOSIS — E782 Mixed hyperlipidemia: Secondary | ICD-10-CM

## 2023-10-04 DIAGNOSIS — I2510 Atherosclerotic heart disease of native coronary artery without angina pectoris: Secondary | ICD-10-CM | POA: Diagnosis not present

## 2023-10-04 DIAGNOSIS — I110 Hypertensive heart disease with heart failure: Secondary | ICD-10-CM | POA: Diagnosis not present

## 2023-10-04 DIAGNOSIS — I5032 Chronic diastolic (congestive) heart failure: Secondary | ICD-10-CM

## 2023-10-04 DIAGNOSIS — Z7901 Long term (current) use of anticoagulants: Secondary | ICD-10-CM

## 2023-10-04 MED ORDER — FUROSEMIDE 20 MG PO TABS: 20.0000 mg | ORAL_TABLET | Freq: Two times a day (BID) | ORAL | 3 refills | Status: DC

## 2023-10-04 NOTE — Patient Instructions (Signed)
Medication Instructions:  Your physician has recommended you make the following change in your medication:  START: Furosemide 20 mg two times day  *If you need a refill on your cardiac medications before your next appointment, please call your pharmacy*   Lab Work: None If you have labs (blood work) drawn today and your tests are completely normal, you will receive your results only by: MyChart Message (if you have MyChart) OR A paper copy in the mail If you have any lab test that is abnormal or we need to change your treatment, we will call you to review the results.   Testing/Procedures: None   Follow-Up: At [facility], you and your health needs are our priority.  As part of our continuing mission to provide you with exceptional heart care, we have created designated Provider Care Teams.  These Care Teams include your primary Cardiologist (physician) and Advanced Practice Providers (APPs -  Physician Assistants and Nurse Practitioners) who all work together to provide you with the care you need, when you need it.  We recommend signing up for the patient portal called "MyChart".  Sign up information is provided on this After Visit Summary.  MyChart is used to connect with patients for Virtual Visits (Telemedicine).  Patients are able to view lab/test results, encounter notes, upcoming appointments, etc.  Non-urgent messages can be sent to your provider as well.   To learn more about what you can do with MyChart, go to [email or web address].    Your next appointment:   3 month(s)  Provider:   Norman Herrlich, MD    Other Instructions Have a Pro BNP lab drawn at Dr. Marigene Ehlers office

## 2023-10-10 ENCOUNTER — Telehealth: Payer: Self-pay | Admitting: Cardiology

## 2023-10-10 NOTE — Telephone Encounter (Signed)
Patient c/o Palpitations:  High priority if patient c/o lightheadedness, shortness of breath, or chest pain  How long have you had palpitations/irregular HR/ Afib? Are you having the symptoms now?  Watch was showing afib yesterday, but she is no longer in afib now  Are you currently experiencing lightheadedness, SOB or CP?  No  Do you have a history of afib (atrial fibrillation) or irregular heart rhythm?  See past appointment notes  Have you checked your BP or HR? (document readings if available):  2/20: HR 77  Are you experiencing any other symptoms?  Currently sick, runny nose, irritated eyes. Patient took allergy medication

## 2023-10-10 NOTE — Telephone Encounter (Signed)
 Left message for the patient to call back.

## 2023-10-11 NOTE — Telephone Encounter (Signed)
 Left message for the patient to call back.

## 2023-10-14 NOTE — Telephone Encounter (Signed)
 Left message for the patient to call back.

## 2023-10-15 ENCOUNTER — Emergency Department (HOSPITAL_COMMUNITY): Payer: PPO

## 2023-10-15 ENCOUNTER — Encounter (HOSPITAL_COMMUNITY): Payer: Self-pay

## 2023-10-15 ENCOUNTER — Inpatient Hospital Stay (HOSPITAL_COMMUNITY)
Admission: EM | Admit: 2023-10-15 | Discharge: 2023-10-17 | DRG: 091 | Disposition: A | Discharge status: Home / Self Care | Payer: PPO | Attending: Internal Medicine | Admitting: Internal Medicine

## 2023-10-15 ENCOUNTER — Other Ambulatory Visit: Payer: Self-pay

## 2023-10-15 DIAGNOSIS — F32A Depression, unspecified: Secondary | ICD-10-CM | POA: Diagnosis present

## 2023-10-15 DIAGNOSIS — J9611 Chronic respiratory failure with hypoxia: Secondary | ICD-10-CM | POA: Diagnosis not present

## 2023-10-15 DIAGNOSIS — I130 Hypertensive heart and chronic kidney disease with heart failure and stage 1 through stage 4 chronic kidney disease, or unspecified chronic kidney disease: Secondary | ICD-10-CM | POA: Diagnosis present

## 2023-10-15 DIAGNOSIS — E1169 Type 2 diabetes mellitus with other specified complication: Secondary | ICD-10-CM | POA: Diagnosis present

## 2023-10-15 DIAGNOSIS — T426X5A Adverse effect of other antiepileptic and sedative-hypnotic drugs, initial encounter: Secondary | ICD-10-CM | POA: Diagnosis present

## 2023-10-15 DIAGNOSIS — Z7984 Long term (current) use of oral hypoglycemic drugs: Secondary | ICD-10-CM | POA: Diagnosis not present

## 2023-10-15 DIAGNOSIS — Z888 Allergy status to other drugs, medicaments and biological substances status: Secondary | ICD-10-CM

## 2023-10-15 DIAGNOSIS — E1122 Type 2 diabetes mellitus with diabetic chronic kidney disease: Secondary | ICD-10-CM | POA: Diagnosis present

## 2023-10-15 DIAGNOSIS — I4821 Permanent atrial fibrillation: Secondary | ICD-10-CM | POA: Diagnosis present

## 2023-10-15 DIAGNOSIS — Z823 Family history of stroke: Secondary | ICD-10-CM

## 2023-10-15 DIAGNOSIS — Z83438 Family history of other disorder of lipoprotein metabolism and other lipidemia: Secondary | ICD-10-CM

## 2023-10-15 DIAGNOSIS — E039 Hypothyroidism, unspecified: Secondary | ICD-10-CM | POA: Diagnosis present

## 2023-10-15 DIAGNOSIS — Z8261 Family history of arthritis: Secondary | ICD-10-CM

## 2023-10-15 DIAGNOSIS — Z993 Dependence on wheelchair: Secondary | ICD-10-CM | POA: Diagnosis not present

## 2023-10-15 DIAGNOSIS — E782 Mixed hyperlipidemia: Secondary | ICD-10-CM | POA: Diagnosis present

## 2023-10-15 DIAGNOSIS — J9601 Acute respiratory failure with hypoxia: Secondary | ICD-10-CM | POA: Diagnosis present

## 2023-10-15 DIAGNOSIS — G9340 Encephalopathy, unspecified: Secondary | ICD-10-CM | POA: Diagnosis not present

## 2023-10-15 DIAGNOSIS — Z885 Allergy status to narcotic agent status: Secondary | ICD-10-CM

## 2023-10-15 DIAGNOSIS — G928 Other toxic encephalopathy: Secondary | ICD-10-CM | POA: Diagnosis present

## 2023-10-15 DIAGNOSIS — E2740 Unspecified adrenocortical insufficiency: Secondary | ICD-10-CM | POA: Diagnosis present

## 2023-10-15 DIAGNOSIS — T424X5A Adverse effect of benzodiazepines, initial encounter: Secondary | ICD-10-CM | POA: Diagnosis present

## 2023-10-15 DIAGNOSIS — R569 Unspecified convulsions: Secondary | ICD-10-CM | POA: Diagnosis not present

## 2023-10-15 DIAGNOSIS — Z881 Allergy status to other antibiotic agents status: Secondary | ICD-10-CM

## 2023-10-15 DIAGNOSIS — R0902 Hypoxemia: Secondary | ICD-10-CM | POA: Diagnosis not present

## 2023-10-15 DIAGNOSIS — I5022 Chronic systolic (congestive) heart failure: Secondary | ICD-10-CM | POA: Diagnosis not present

## 2023-10-15 DIAGNOSIS — Z87891 Personal history of nicotine dependence: Secondary | ICD-10-CM | POA: Diagnosis not present

## 2023-10-15 DIAGNOSIS — G9341 Metabolic encephalopathy: Secondary | ICD-10-CM

## 2023-10-15 DIAGNOSIS — I4819 Other persistent atrial fibrillation: Secondary | ICD-10-CM | POA: Diagnosis present

## 2023-10-15 DIAGNOSIS — I428 Other cardiomyopathies: Secondary | ICD-10-CM | POA: Diagnosis present

## 2023-10-15 DIAGNOSIS — Z886 Allergy status to analgesic agent status: Secondary | ICD-10-CM

## 2023-10-15 DIAGNOSIS — N1832 Chronic kidney disease, stage 3b: Secondary | ICD-10-CM | POA: Diagnosis present

## 2023-10-15 DIAGNOSIS — I5042 Chronic combined systolic (congestive) and diastolic (congestive) heart failure: Secondary | ICD-10-CM | POA: Diagnosis present

## 2023-10-15 DIAGNOSIS — Z882 Allergy status to sulfonamides status: Secondary | ICD-10-CM

## 2023-10-15 DIAGNOSIS — Z8249 Family history of ischemic heart disease and other diseases of the circulatory system: Secondary | ICD-10-CM

## 2023-10-15 DIAGNOSIS — Z7901 Long term (current) use of anticoagulants: Secondary | ICD-10-CM | POA: Diagnosis not present

## 2023-10-15 DIAGNOSIS — Z79899 Other long term (current) drug therapy: Secondary | ICD-10-CM

## 2023-10-15 DIAGNOSIS — Z91048 Other nonmedicinal substance allergy status: Secondary | ICD-10-CM

## 2023-10-15 DIAGNOSIS — R4182 Altered mental status, unspecified: Principal | ICD-10-CM

## 2023-10-15 DIAGNOSIS — F419 Anxiety disorder, unspecified: Secondary | ICD-10-CM | POA: Diagnosis present

## 2023-10-15 DIAGNOSIS — I2510 Atherosclerotic heart disease of native coronary artery without angina pectoris: Secondary | ICD-10-CM | POA: Diagnosis present

## 2023-10-15 DIAGNOSIS — T48205A Adverse effect of unspecified drugs acting on muscles, initial encounter: Secondary | ICD-10-CM | POA: Diagnosis present

## 2023-10-15 DIAGNOSIS — Z809 Family history of malignant neoplasm, unspecified: Secondary | ICD-10-CM

## 2023-10-15 LAB — COMPREHENSIVE METABOLIC PANEL
ALT: 15 U/L (ref 0–44)
AST: 17 U/L (ref 15–41)
Albumin: 3.3 g/dL — ABNORMAL LOW (ref 3.5–5.0)
Alkaline Phosphatase: 40 U/L (ref 38–126)
Anion gap: 9 (ref 5–15)
BUN: 10 mg/dL (ref 8–23)
CO2: 34 mmol/L — ABNORMAL HIGH (ref 22–32)
Calcium: 9.2 mg/dL (ref 8.9–10.3)
Chloride: 100 mmol/L (ref 98–111)
Creatinine, Ser: 1.25 mg/dL — ABNORMAL HIGH (ref 0.44–1.00)
GFR, Estimated: 49 mL/min — ABNORMAL LOW (ref >60)
Glucose, Bld: 108 mg/dL — ABNORMAL HIGH (ref 70–99)
Potassium: 4.4 mmol/L (ref 3.5–5.1)
Sodium: 143 mmol/L (ref 135–145)
Total Bilirubin: 0.4 mg/dL (ref 0.0–1.2)
Total Protein: 5.9 g/dL — ABNORMAL LOW (ref 6.5–8.1)

## 2023-10-15 LAB — DIFFERENTIAL
Abs Immature Granulocytes: 0.03 10*3/uL (ref 0.00–0.07)
Basophils Absolute: 0 10*3/uL (ref 0.0–0.1)
Basophils Relative: 1 %
Eosinophils Absolute: 0.1 10*3/uL (ref 0.0–0.5)
Eosinophils Relative: 3 %
Immature Granulocytes: 1 %
Lymphocytes Relative: 23 %
Lymphs Abs: 1.1 10*3/uL (ref 0.7–4.0)
Monocytes Absolute: 0.4 10*3/uL (ref 0.1–1.0)
Monocytes Relative: 7 %
Neutro Abs: 3.4 10*3/uL (ref 1.7–7.7)
Neutrophils Relative %: 65 %

## 2023-10-15 LAB — I-STAT CHEM 8, ED
BUN: 12 mg/dL (ref 8–23)
Calcium, Ion: 1.11 mmol/L — ABNORMAL LOW (ref 1.15–1.40)
Chloride: 99 mmol/L (ref 98–111)
Creatinine, Ser: 1.4 mg/dL — ABNORMAL HIGH (ref 0.44–1.00)
Glucose, Bld: 100 mg/dL — ABNORMAL HIGH (ref 70–99)
HCT: 31 % — ABNORMAL LOW (ref 36.0–46.0)
Hemoglobin: 10.5 g/dL — ABNORMAL LOW (ref 12.0–15.0)
Potassium: 4.2 mmol/L (ref 3.5–5.1)
Sodium: 140 mmol/L (ref 135–145)
TCO2: 32 mmol/L (ref 22–32)

## 2023-10-15 LAB — URINALYSIS, ROUTINE W REFLEX MICROSCOPIC
Bacteria, UA: NONE SEEN
Bilirubin Urine: NEGATIVE
Glucose, UA: >=500 mg/dL — AB
Hgb urine dipstick: NEGATIVE
Ketones, ur: NEGATIVE mg/dL
Leukocytes,Ua: NEGATIVE
Nitrite: NEGATIVE
Protein, ur: >=300 mg/dL — AB
Specific Gravity, Urine: 1.013 (ref 1.005–1.030)
pH: 6 (ref 5.0–8.0)

## 2023-10-15 LAB — I-STAT VENOUS BLOOD GAS, ED
Acid-Base Excess: 11 mmol/L — ABNORMAL HIGH (ref 0.0–2.0)
Bicarbonate: 36.6 mmol/L — ABNORMAL HIGH (ref 20.0–28.0)
Calcium, Ion: 1.1 mmol/L — ABNORMAL LOW (ref 1.15–1.40)
HCT: 32 % — ABNORMAL LOW (ref 36.0–46.0)
Hemoglobin: 10.9 g/dL — ABNORMAL LOW (ref 12.0–15.0)
O2 Saturation: 100 %
Potassium: 4.4 mmol/L (ref 3.5–5.1)
Sodium: 140 mmol/L (ref 135–145)
TCO2: 38 mmol/L — ABNORMAL HIGH (ref 22–32)
pCO2, Ven: 54 mm[Hg] (ref 44–60)
pH, Ven: 7.44 — ABNORMAL HIGH (ref 7.25–7.43)
pO2, Ven: 179 mm[Hg] — ABNORMAL HIGH (ref 32–45)

## 2023-10-15 LAB — RAPID URINE DRUG SCREEN, HOSP PERFORMED
Amphetamines: NOT DETECTED
Barbiturates: NOT DETECTED
Benzodiazepines: POSITIVE — AB
Cocaine: NOT DETECTED
Opiates: NOT DETECTED
Tetrahydrocannabinol: NOT DETECTED

## 2023-10-15 LAB — CBC
HCT: 34.4 % — ABNORMAL LOW (ref 36.0–46.0)
Hemoglobin: 10.3 g/dL — ABNORMAL LOW (ref 12.0–15.0)
MCH: 28.8 pg (ref 26.0–34.0)
MCHC: 29.9 g/dL — ABNORMAL LOW (ref 30.0–36.0)
MCV: 96.1 fL (ref 80.0–100.0)
Platelets: 239 10*3/uL (ref 150–400)
RBC: 3.58 MIL/uL — ABNORMAL LOW (ref 3.87–5.11)
RDW: 14.9 % (ref 11.5–15.5)
WBC: 5.1 10*3/uL (ref 4.0–10.5)
nRBC: 0 % (ref 0.0–0.2)

## 2023-10-15 LAB — ETHANOL: Alcohol, Ethyl (B): <10 mg/dL (ref <10)

## 2023-10-15 LAB — RESP PANEL BY RT-PCR (RSV, FLU A&B, COVID)  RVPGX2
Influenza A by PCR: NEGATIVE
Influenza B by PCR: NEGATIVE
Resp Syncytial Virus by PCR: NEGATIVE
SARS Coronavirus 2 by RT PCR: NEGATIVE

## 2023-10-15 LAB — I-STAT CG4 LACTIC ACID, ED: Lactic Acid, Venous: 0.9 mmol/L (ref 0.5–1.9)

## 2023-10-15 LAB — PROTIME-INR
INR: 1.2 (ref 0.8–1.2)
Prothrombin Time: 15.1 s (ref 11.4–15.2)

## 2023-10-15 LAB — CBG MONITORING, ED: Glucose-Capillary: 105 mg/dL — ABNORMAL HIGH (ref 70–99)

## 2023-10-15 LAB — AMMONIA: Ammonia: 32 umol/L (ref 9–35)

## 2023-10-15 LAB — TSH: TSH: 0.505 u[IU]/mL (ref 0.350–4.500)

## 2023-10-15 LAB — APTT: aPTT: 30 s (ref 24–36)

## 2023-10-15 MED ORDER — HYDROCORTISONE SOD SUC (PF) 100 MG IJ SOLR
75.0000 mg | Freq: Two times a day (BID) | INTRAMUSCULAR | Status: DC
  Administered 2023-10-15 – 2023-10-17 (×4): 75 mg via INTRAVENOUS
  Filled 2023-10-15 (×4): qty 2

## 2023-10-15 NOTE — Telephone Encounter (Signed)
 Received a call from [name] stating that the patient is having a hard time staying awake and her oxygen saturation was 84. She states that she turned up her oxygen and now her oxygen saturation is 90 - 91%. Based on the patient condition as reported to me by [name] I recommended that they call 911 and take the patient to the ER. Angie agreed and verbalized understanding and had no further questions at this time.

## 2023-10-15 NOTE — Progress Notes (Signed)
 EEG complete - results pending

## 2023-10-15 NOTE — ED Notes (Signed)
Patient transported to MRI with family  

## 2023-10-15 NOTE — ED Provider Notes (Signed)
 [place] EMERGENCY DEPARTMENT AT [facility] Provider Note   CSN: [id number] Arrival date & time: 10/15/23  1511  An emergency department physician performed an initial assessment on this suspected stroke patient at 51.  History No chief complaint on file.   HPI Brianna Porter is a 63 y.o. female presenting for altered mental status.  63 year old female expansive medical history.  Last seen well last night at 9 PM per family member. EMS was called out today by the family member who is checking on the patient.  Per chart review, external provider called patient's sister and told her to check on the patient as she was not answering her phone today. Sister got there and patient was lethargic, falling asleep in between questions and EMS was called out. EMS activated code stroke with positive LVO symptoms given severity of presentation. Patient cannot answer any questions at this time..   Patient's recorded medical, surgical, social, medication list and allergies were reviewed in the Snapshot window as part of the initial history.   Review of Systems   Review of Systems  Unable to perform ROS: Mental status change    Physical Exam Updated Vital Signs BP 131/70   Pulse 94   Temp (!) 97.3 F (36.3 C)   Resp (!) 24   Wt 67.4 kg   LMP  (LMP Unknown) Comment: ?10 years ago?  SpO2 98%   BMI 26.32 kg/m  Physical Exam Constitutional:      General: She is not in acute distress.    Appearance: She is not ill-appearing or toxic-appearing.  HENT:     Head: Normocephalic and atraumatic.  Eyes:     Extraocular Movements: Extraocular movements intact.     Pupils: Pupils are equal, round, and reactive to light.  Cardiovascular:     Rate and Rhythm: Normal rate.  Pulmonary:     Effort: No respiratory distress.  Abdominal:     General: Abdomen is flat.  Musculoskeletal:        General: No swelling, deformity or signs of injury.     Cervical back: Normal range of  motion. No rigidity.  Skin:    General: Skin is warm and dry.  Neurological:     Mental Status: She is alert. She is disoriented.     Comments: Agree with exam per neurology.  Psychiatric:        Mood and Affect: Mood normal.      ED Course/ Medical Decision Making/ A&P    Procedures .Critical Care  Performed by: Glyn Ade, MD Authorized by: Glyn Ade, MD   Critical care provider statement:    Critical care time (minutes):  35   Critical care was necessary to treat or prevent imminent or life-threatening deterioration of the following conditions:  CNS failure or compromise   Critical care was time spent personally by me on the following activities:  Development of treatment plan with patient or surrogate, discussions with consultants, evaluation of patient's response to treatment, examination of patient, ordering and review of laboratory studies, ordering and review of radiographic studies, ordering and performing treatments and interventions, pulse oximetry, re-evaluation of patient's condition and review of old charts   Care discussed with: admitting provider      Medications Ordered in ED Medications  hydrocortisone sodium succinate (SOLU-CORTEF) 100 MG injection 75 mg (has no administration in time range)   Medical Decision Making:    Brianna Porter is a 62 y.o. female who presented to the ED  today with AMS/Weakness/Aphasia.  Due to these symptoms, nursing activated a CODE STROKE per hospital protocol.   Patient placed on continuous vitals and telemetry monitoring while in ED which was reviewed periodically.   On my initial exam, the pt was in no acute distress, glucose was WNL and deficits include aphasia.  Deficits are persistent.   Reviewed and confirmed nursing documentation for past medical history, family history, social history.  Initial Assessment and Plan:   Patient immediately evaluated jointly by neurology and emergency department providers.    This is most consistent with an acute life/limb threatening illness complicated by underlying chronic conditions. Patient evaluated per code stroke protocol with immediate cross-sectional imaging of the head via CT head to evaluate for intracranial hemorrhage.  Per neurology, these rapid studies revealed no acute pathology. Neurology feels that patient's presentation is more consistent with alternative pathology given these findings.  Differential includes metabolic encephalopathy, medication encephalopathy, infectious encephalopathy. Neurology has recommended an MRI for further differentiation of patient's syndrome and evaluation for any ischemic disease while completing a metabolic/infectious workup with laboratory evaluation per EMR.  Initial Study Results: Labs Labs reviewed without evidence of clinically relevant abnormality.  EKG EKG was reviewed independently. Rate, rhythm, axis, intervals all examined and without medically relevant abnormality. ST segments without concerns for elevations.    Radiology  Images reviewed independently, agree with radiology report at this time.   MR BRAIN WO CONTRAST  Final Result    DG Chest Portable 1 View  Final Result    CT HEAD CODE STROKE WO CONTRAST  Final Result        Final Assessment and Plan:   Uncertain what is causing patient's relatively acute encephalopathy.  Neurology suspects it was a hypoxic event though EEG is still pending. Uncertain if it is patient's benzodiazepine that might of caused her altered sensorium.  Regardless, she will require admission to medicine.  She is stable on 2 L nasal cannula GCS 10-13 depending on time evaluation. Disposition:   Based on the above findings, I believe this patient is stable for admission.    Patient/family educated about specific findings on our evaluation and explained exact reasons for admission.  Patient/family educated about clinical situation and time was allowed to answer  questions.   Admission team communicated with and agreed with need for admission. Patient admitted. Patient ready to move at this time.     Emergency Department Medication Summary:   Medications  hydrocortisone sodium succinate (SOLU-CORTEF) 100 MG injection 75 mg (has no administration in time range)         Clinical Impression: No diagnosis found.   Admit   Final Clinical Impression(s) / ED Diagnoses Final diagnoses:  None    Rx / DC Orders ED Discharge Orders     None         Glyn Ade, MD 10/15/23 2252

## 2023-10-15 NOTE — ED Triage Notes (Signed)
 Patient BIB [facility] from home for stroke like symptoms. Patient minimally responsive on arrival, intermittently follows commands, wakes to painful stimuli. Patient VS stable en route, LKW 2100 2- 24- 25

## 2023-10-15 NOTE — Consult Note (Signed)
 NEUROLOGY CONSULT NOTE   Date of service: October 15, 2023 Patient Name: Brianna Porter MRN:  [id number] DOB:  Mar 18, 1961 Chief Complaint: "Unresponsive" Requesting Provider: Glyn Ade, MD  History of Present Illness  Brianna Porter is a 63 y.o. female with hx of chronic atrial fibrillation on Eliquis, amiodarone induced thyrotoxicosis, adrenal insufficiency, severe cardiomyopathy (EF 20 to 25% initially improving to 40 to 45%), nonobstructive coronary artery disease, CKD stage IIIb  On review of recent telephone notes she called her physician on 2/20 complaining of palpitations and reporting that she was feeling sick with a runny nose and irritated eyes for which she had taken allergy medication.  Subsequently there were multiple attempts for her providers to reach her without success.  Ultimately Laurence Compton was called on 2/20 at approximately 1:30 PM "Thereasa Distance, the patient's sister and she stated that she would contact the patient and have her call the office. She also stated that the patient was not doing well and they were trying to help her get into a facility that could help take care of her. I explained that her PCP may be able to help them with getting her into a facility. [name] had no further questions at this time."  Patient's other sister Maryalyce Sanjuan last saw the patient normal at 9:30 PM --she visited the patient today at 2:30 PM and found her unresponsive.  Give a slightly different history to outpatient provider per notes, stating that the patient was having a hard time staying awake and her oxygen saturation was 84%, reportedly with improvement to 90 to 91% with increasing her oxygen  Patient was hypoxic to 81% on EMS arrival and they noted that while she was on 2 L there was an excessive amount of tubing.  She was put on 2 L with EMS tubing and came up to the high 90s.  Heart rates were A-fib in the mid 80s, glucose 105 on arrival here, normotensive  throughout She was poorly interactive but without any unilateral signs or symptoms, code stroke was activated for acute confusion  Per Angie, at baseline states that the patient is unable to walk but could bathe and dress herself and prepare simple meals. Patient's sister visited frequently and helped with housework and other tasks. According to sister, patient was normally compliant with her Eliquis.   Patient was hospitalized at [facility] last month for respiratory failure associated with community-acquired pneumonia and CHF flare. She did go into A-fib with RVR during that hospitalization, and cardioversion was attempted. This was ultimately unsuccessful, and per patient's cardiologist she is likely in permanent A-fib. She was discharged on home O2, and per her sister, she was able to get around her house using a wheelchair.    LKW: 9 PM Modified rankin score: 3-4 reportedly takes care of her ADLs independently but there are also concerns about her ability to manage outside of the facility and she uses a wheelchair IV Thrombolysis: No, Eliquis, out of the window EVT: No exam not consistent with LVO, risk of IV contrast administration felt to outweigh benefit in this setting given her CKD   NIHSS components Score: Comment  1a Level of Conscious 0[x]  1[]  2[]  3[]      1b LOC Questions 0[]  1[]  2[x]       1c LOC Commands 0[]  1[]  2[x]       2 Best Gaze 0[x]  1[]  2[]       3 Visual 0[x]  1[]  2[]  3[]      4 Facial  Palsy 0[x]  1[]  2[]  3[]      5a Motor Arm - left 0[]  1[]  2[]  3[x]  4[]  UN[]    5b Motor Arm - Right 0[]  1[]  2[]  3[x]  4[]  UN[]    6a Motor Leg - Left 0[]  1[]  2[]  3[x]  4[]  UN[]    6b Motor Leg - Right 0[]  1[]  2[]  3[x]  4[]  UN[]    7 Limb Ataxia 0[]  1[]  2[]  3[]  UN[x]     8 Sensory 0[x]  1[]  2[]  UN[]      9 Best Language 0[]  1[]  2[x]  3[]      10 Dysarthria 0[]  1[x]  2[]  UN[]      11 Extinct. and Inattention 0[x]  1[]  2[]       TOTAL: 19       ROS  Unable to assess secondary to patient's  mental status   Past History   Past Medical History:  Diagnosis Date   Acute gastric ulcer with hemorrhage    Acute systolic heart failure (HCC) 05/07/2019   AF (paroxysmal atrial fibrillation) (HCC)    AKI (acute kidney injury) (HCC) 05/07/2019   Altered mental status 05/07/2019   Anemia 04/23/2019   Anxiety and depression    Atrial fibrillation with rapid ventricular response (HCC) 04/13/2023   Atrial flutter (HCC)    CAD (coronary artery disease)    Chronic combined systolic (congestive) and diastolic (congestive) heart failure (HCC)    a. dx in setting of atrial fib/flutter - possibly tachy mediated. Coronary CTA with only mild CAD in 04/2019.     Chronic kidney disease, stage 3b (HCC) 04/23/2019   Chronic systolic CHF (congestive heart failure) (HCC)    a. dx in setting of atrial fib/flutter - possibly tachy mediated. Coronary CTA with only mild CAD in 04/2019.   CKD (chronic kidney disease) stage 3, GFR 30-59 ml/min (HCC) 04/23/2019   Closed right hip fracture (HCC) 03/14/2021   Confusion    a. persistent confusion during 04/2019 admission of unclear cause. Home meds adjusted. CT/MRI brain nonacute.   Convulsions (HCC) 03/06/2020   Depression 03/02/2019   Diabetes (HCC)    Edema    Elevated liver function tests    Encephalopathy 03/05/2020   Essential hypertension    Fall 12/23/2019   Falls    Gastrointestinal hemorrhage    Generalized anxiety disorder 03/14/2021   Hypercholesteremia    Hyperkalemia    Hyperlipidemia 04/23/2019   Hypertensive heart and chronic kidney disease with systolic congestive heart failure (HCC)    Hypertensive heart disease 03/02/2019   Hyperthyroidism    Hypokalemia    Hypokalemia    Hypomagnesemia    Hyponatremia    Hypothyroidism 04/15/2020   Iron deficiency anemia    Leukocytosis 03/14/2021   Mild CAD    a. Coronary CT 04/2019 - Minimal, Non-obstructive CAD.   Mixed diabetic hyperlipidemia associated with type 2 diabetes mellitus (HCC)  03/14/2021   NICM (nonischemic cardiomyopathy) (HCC)    Osteoarthritis 03/02/2019   PAF (paroxysmal atrial fibrillation) (HCC)    Persistent atrial fibrillation (HCC)    Pressure injury of skin 07/09/2019   Prolonged QT interval    Secondary adrenal insufficiency (HCC) 04/15/2020   Symptomatic anemia 06/28/2019   Type 2 diabetes mellitus with stage 3b chronic kidney disease, without long-term current use of insulin (HCC) 03/02/2019   Vitamin D deficiency 04/15/2020    Past Surgical History:  Procedure Laterality Date   BIOPSY  06/29/2019   Procedure: BIOPSY;  Surgeon: Hilarie Fredrickson, MD;  Location: [facility] ENDOSCOPY;  Service: Endoscopy;;   CARDIOVERSION N/A 04/29/2019  Procedure: CARDIOVERSION;  Surgeon: Thurmon Fair, MD;  Location: [facility] ENDOSCOPY;  Service: Cardiovascular;  Laterality: N/A;   CARDIOVERSION N/A 05/04/2019   Procedure: CARDIOVERSION;  Surgeon: Wendall Stade, MD;  Location: [facility] ENDOSCOPY;  Service: Cardiovascular;  Laterality: N/A;   COLONOSCOPY WITH PROPOFOL N/A 07/12/2019   Procedure: COLONOSCOPY WITH PROPOFOL;  Surgeon: Sherrilyn Rist, MD;  Location: [facility] ENDOSCOPY;  Service: Gastroenterology;  Laterality: N/A;   ENTEROSCOPY N/A 07/10/2019   Procedure: ENTEROSCOPY;  Surgeon: Sherrilyn Rist, MD;  Location: [facility] ENDOSCOPY;  Service: Gastroenterology;  Laterality: N/A;   ESOPHAGOGASTRODUODENOSCOPY  06/29/2019   ESOPHAGOGASTRODUODENOSCOPY (EGD) WITH PROPOFOL N/A 06/29/2019   Procedure: ESOPHAGOGASTRODUODENOSCOPY (EGD) WITH PROPOFOL;  Surgeon: Hilarie Fredrickson, MD;  Location: [facility] ENDOSCOPY;  Service: Endoscopy;  Laterality: N/A;   GIVENS CAPSULE STUDY N/A 07/12/2019   Procedure: GIVENS CAPSULE STUDY;  Surgeon: Sherrilyn Rist, MD;  Location: [facility] ENDOSCOPY;  Service: Gastroenterology;  Laterality: N/A;   HEMOSTASIS CLIP PLACEMENT  07/12/2019   Procedure: HEMOSTASIS CLIP PLACEMENT;  Surgeon: Sherrilyn Rist, MD;  Location: [facility] ENDOSCOPY;  Service: Gastroenterology;;    INTRAMEDULLARY (IM) NAIL INTERTROCHANTERIC Right 03/15/2021   Procedure: INTRAMEDULLARY (IM) NAIL INTERTROCHANTRIC;  Surgeon: Roby Lofts, MD;  Location: [facility] OR;  Service: Orthopedics;  Laterality: Right;   POLYPECTOMY  07/12/2019   Procedure: POLYPECTOMY;  Surgeon: Sherrilyn Rist, MD;  Location: [facility] ENDOSCOPY;  Service: Gastroenterology;;   SMALL BOWEL ENTEROSCOPY  07/10/2019   SUBMUCOSAL TATTOO INJECTION  07/10/2019   Procedure: SUBMUCOSAL TATTOO INJECTION;  Surgeon: Sherrilyn Rist, MD;  Location: [facility] ENDOSCOPY;  Service: Gastroenterology;;   TEE WITHOUT CARDIOVERSION  04/29/2019   TEE WITHOUT CARDIOVERSION N/A 04/29/2019   Procedure: TRANSESOPHAGEAL ECHOCARDIOGRAM (TEE);  Surgeon: Thurmon Fair, MD;  Location: [facility] ENDOSCOPY;  Service: Cardiovascular;  Laterality: N/A;   TUBAL LIGATION      Family History: Family History  Problem Relation Age of Onset   Cancer Mother    Heart disease Mother    Hypercholesterolemia Mother    Osteoarthritis Mother    Stroke Mother    Heart disease Father    Hypercholesterolemia Brother     Social History  reports that she quit smoking about 24 years ago. Her smoking use included cigarettes. She started smoking about 54 years ago. She has a 60 pack-year smoking history. She has never used smokeless tobacco. She reports that she does not drink alcohol and does not use drugs.  Allergies  Allergen Reactions   Ambien [Zolpidem Tartrate]     Pt and family state this med makes the patient sleepwalk.   Mobic [Meloxicam] Nausea Only    GI pain   Adhesive [Tape]     Tears skin, please use paper tape   Amiodarone Other (See Comments)    thyrotoxicosis   Codeine Nausea And Vomiting   Erythromycin Base     Other Reaction(s): Hives*   Nsaids     Other Reaction(s): See Comments   Sulfa Antibiotics     Other Reaction(s): Hives*   Tetracycline     Other Reaction(s): Hives*    Medications  No current facility-administered medications for this  encounter.  Current Outpatient Medications:    acetaminophen (TYLENOL) 325 MG tablet, Take 650 mg by mouth every 6 (six) hours as needed for mild pain., Disp: , Rfl:    ALPRAZolam (XANAX) 0.5 MG tablet, Take 0.5 mg by mouth 4 (four) times daily., Disp: , Rfl:    apixaban (ELIQUIS) 5  MG TABS tablet, Take 1 tablet (5 mg total) by mouth 2 (two) times daily., Disp: 60 tablet, Rfl: 5   baclofen (LIORESAL) 10 MG tablet, Take 10 mg by mouth 2 (two) times daily as needed for muscle spasms., Disp: , Rfl:    dapagliflozin propanediol (FARXIGA) 10 MG TABS tablet, Take 1 tablet by mouth daily., Disp: , Rfl:    diltiazem (TIAZAC) 360 MG 24 hr capsule, Take 360 mg by mouth daily., Disp: , Rfl:    fenofibrate (TRICOR) 48 MG tablet, Take 48 mg by mouth daily., Disp: , Rfl:    furosemide (LASIX) 20 MG tablet, Take 1 tablet (20 mg total) by mouth 2 (two) times daily., Disp: 180 tablet, Rfl: 3   hydrocortisone (CORTEF) 10 MG tablet, Take 10 mg by mouth 2 (two) times daily., Disp: , Rfl:    levETIRAcetam (KEPPRA) 500 MG tablet, Take 500 mg by mouth 2 (two) times daily., Disp: , Rfl:    magnesium oxide (MAG-OX) 400 MG tablet, Take 400 mg by mouth daily., Disp: , Rfl:    metFORMIN (GLUCOPHAGE) 1000 MG tablet, Take 1,000 mg by mouth daily as needed (sugar)., Disp: , Rfl:    metoprolol succinate (TOPROL-XL) 100 MG 24 hr tablet, Take 1 tablet (100 mg total) by mouth daily. Take with or immediately following a meal., Disp: 90 tablet, Rfl: 1   Multiple Vitamin (MULTIVITAMIN) tablet, Take 1 tablet by mouth daily., Disp: , Rfl:    Multiple Vitamins-Minerals (HAIR SKIN & NAILS PO), Take 1 tablet by mouth daily., Disp: , Rfl:    pantoprazole (PROTONIX) 40 MG tablet, Take 40 mg by mouth 2 (two) times daily. , Disp: , Rfl:    pregabalin (LYRICA) 100 MG capsule, Take 100 mg by mouth 2 (two) times daily., Disp: , Rfl:    promethazine (PHENERGAN) 25 MG tablet, Take 25 mg by mouth every 6 (six) hours as needed for nausea or  vomiting. , Disp: , Rfl:    rosuvastatin (CRESTOR) 10 MG tablet, Take 1 tablet by mouth daily., Disp: , Rfl:    sertraline (ZOLOFT) 25 MG tablet, Take 1 tablet by mouth daily., Disp: , Rfl:    traMADol (ULTRAM) 50 MG tablet, Take 50 mg by mouth every 6 (six) hours as needed for moderate pain (pain score 4-6) or severe pain (pain score 7-10)., Disp: , Rfl:   Vitals   Vitals:   10/15/23 1521  Weight: 67.4 kg    Body mass index is 26.32 kg/m.  Physical Exam   Constitutional: Appears chronically ill with central obesity and peripheral muscle wasting Psych: Affect calm but poorly interactive Head: Normocephalic.  Nasal cannula in place.  No scleral injection Cardiovascular: Normal rate, irregular rhythm Respiratory: Effort normal, non-labored breathing.  Comfortable on 2 L nasal cannula GI: Soft.  No distension. There is no tenderness.  Skin: WDI.   Neurologic Examination    Neuro: Mental Status: Patient is awake, does not answer orientation questions, does not follow commands.  On later NP evaluation was able to state her name after further noxious stimulation Cranial Nerves: II: Visual Fields are full by blink to threat. Pupils are equal, round, and reactive to light, 4 mm to 2 mm, brisk III,IV, VI: EOMI, there is direction changing nystagmus when she orients to examiner's voice bilaterally V: Facial sensation is symmetric to eyelash brush VII: Facial movement is symmetric on grimace.  VIII: hearing is intact to voice Remainder limited by mental status/participation Sensory/motor: 2/5 in the bilateral upper extremities (  withdraws to noxious stimulation) 2/5 in the bilateral lower extremities (withdraws, bending at the knee and flexing at the hip) to noxious stimulation, symmetric Cerebellar: Unable to assess secondary to patient's mental status   NIHSS total 19, see full documentation above    Labs/Imaging/Neurodiagnostic studies   CBC:  Recent Labs  Lab 11-12-23 1520  11/12/23 1522  WBC 5.1  --   NEUTROABS 3.4  --   HGB 10.3* 10.5*  HCT 34.4* 31.0*  MCV 96.1  --   PLT 239  --    Basic Metabolic Panel:  Lab Results  Component Value Date   NA 140 2023/11/12   K 4.2 Nov 12, 2023   CO2 24 03/17/2021   GLUCOSE 100 (H) 2023-11-12   BUN 12 2023-11-12   CREATININE 1.40 (H) 11/12/2023   CALCIUM 9.1 03/17/2021   GFRNONAA 42 (L) 03/17/2021   GFRAA 46 (L) 03/18/2020   Lipid Panel: No results found for: "LDLCALC" HgbA1c:  Lab Results  Component Value Date   HGBA1C 5.9 (H) 04/30/2019   Urine Drug Screen:     Component Value Date/Time   LABOPIA NONE DETECTED 03/04/2020 2211   COCAINSCRNUR NONE DETECTED 03/04/2020 2211   LABBENZ POSITIVE (A) 03/04/2020 2211   AMPHETMU NONE DETECTED 03/04/2020 2211   THCU NONE DETECTED 03/04/2020 2211   LABBARB NONE DETECTED 03/04/2020 2211    Alcohol Level No results found for: "ETH" INR  Lab Results  Component Value Date   INR 1.2 03/15/2021   APTT  Lab Results  Component Value Date   APTT 35 03/15/2021   AED levels: No results found for: "PHENYTOIN", "ZONISAMIDE", "LAMOTRIGINE", "LEVETIRACETA"  CT Head without contrast(Personally reviewed): No acute intracranial abnormalities  ASSESSMENT   YOUNG BRIM is a 63 y.o. female with past medical history as above.  Certainly she does have stroke risk factors but her examination is most consistent with encephalopathy and based on history of being hard to awaken and being hypoxic as well as having required BiPAP at her last hospitalization stay, suspect that hypoxia is the primary driver in her presentation.  She is at risk of potential hypoxic brain injury due to unknown amount of time and severity of hypoxia.  Will therefore obtain MRI to rule out hypoxic injury.  EEG to evaluate for epileptogenicity  RECOMMENDATIONS  -MRI brain  -Routine EEG -Neurology will follow-up MRI brain and routine EEG but if the studies are reassuring we will sign off.  Please  do reach out if additional questions or concerns arise, including if patient does not seem to be improving despite supportive care of her medical issues -Appreciate management of comorbidities, hypoxia and other issues per ED/primary team  ______________________________________________________________________   Brooke Dare MD-PhD [facility] [phone] Available 7 AM to 7 PM, outside these hours please contact Neurologist on call listed on AMION   CRITICAL CARE Performed by: Gordy Councilman   Total critical care time: 35 minutes  Critical care time was exclusive of separately billable procedures and treating other patients.  Critical care was necessary to treat or prevent imminent or life-threatening deterioration, emergent evaluation for consideration of thrombectomy or thrombolytic.  Critical care was time spent personally by me on the following activities: development of treatment plan with patient and/or surrogate as well as nursing, discussions with consultants, evaluation of patient's response to treatment, examination of patient, obtaining history from patient or surrogate, ordering and performing treatments and interventions, ordering and review of laboratory studies, ordering and review of radiographic studies, pulse oximetry  and re-evaluation of patient's condition.

## 2023-10-15 NOTE — Telephone Encounter (Signed)
 Called Brianna Porter, the patient's sister and she stated that she would contact the patient and have her call the office. She also stated that the patient was not doing well and they were trying to help her get into a facility that could help take care of her. I explained that her PCP may be able to help them with getting her into a facility. [name] had no further questions at this time.

## 2023-10-15 NOTE — H&P (Addendum)
 History and Physical    Patient: Brianna Porter WUJ:[id number] DOB: 1960/10/09 DOA: 10/15/2023 DOS: the patient was seen and examined on 10/15/2023 PCP: Brianna Curia, MD  Patient coming from: Home  Chief Complaint: No chief complaint on file.  HPI: Brianna Porter is a 63 y.o. female with medical history significant for chronic atrial fibrillation on Eliquis, adrenal insufficiency, severe cardiomyopathy with EF of 40 to 45%, CKD stage IIIb, history of amiodarone induced thyrotoxicosis who was found minimally responsive at home by her sister today at approximately 2:30pm.  Patient lives alone and is wheelchair-bound.  A sister last saw her at baseline last night at about 9:30 PM.   The patient was hospitalized last month at [facility] for CHF and community-acquired pneumonia.  She also went into A-fib with RVR and had cardioversion.  She remains in A-fib but her rate is controlled.  The patient was discharged on 2 L O2 nasal cannula.  When EMS arrived her O2 sats were in the 80s on her 2 L but they noticed excessive amounts of tubing so she was placed on their nasal cannula and her O2 sats came right up to the mid 90s on 2 L. The emergency department a code stroke was activated because of her altered mental status.  She was seen by neurology.  Her workup has led to a MRI of the brain which was normal.  So stroke has been ruled out and there are no significant abnormalities or abnormalities in her vital signs to explain her recent level of consciousness.  The patient will be admitted for continued monitoring.  One possibility is the patient had of an event that led to hypoxia and this is essentially anoxic brain injury.  EEG has been completed. The patient will be admitted and monitored.  Her tox screen is positive for benzodiazepines but Xanax is one of her routine medications.     Review of Systems: unable to review all systems due to the inability of the patient to answer  questions. Past Medical History:  Diagnosis Date   Acute gastric ulcer with hemorrhage    Acute systolic heart failure (HCC) 05/07/2019   AF (paroxysmal atrial fibrillation) (HCC)    AKI (acute kidney injury) (HCC) 05/07/2019   Altered mental status 05/07/2019   Anemia 04/23/2019   Anxiety and depression    Atrial fibrillation with rapid ventricular response (HCC) 04/13/2023   Atrial flutter (HCC)    CAD (coronary artery disease)    Chronic combined systolic (congestive) and diastolic (congestive) heart failure (HCC)    a. dx in setting of atrial fib/flutter - possibly tachy mediated. Coronary CTA with only mild CAD in 04/2019.     Chronic kidney disease, stage 3b (HCC) 04/23/2019   Chronic systolic CHF (congestive heart failure) (HCC)    a. dx in setting of atrial fib/flutter - possibly tachy mediated. Coronary CTA with only mild CAD in 04/2019.   CKD (chronic kidney disease) stage 3, GFR 30-59 ml/min (HCC) 04/23/2019   Closed right hip fracture (HCC) 03/14/2021   Confusion    a. persistent confusion during 04/2019 admission of unclear cause. Home meds adjusted. CT/MRI brain nonacute.   Convulsions (HCC) 03/06/2020   Depression 03/02/2019   Diabetes (HCC)    Edema    Elevated liver function tests    Encephalopathy 03/05/2020   Essential hypertension    Fall 12/23/2019   Falls    Gastrointestinal hemorrhage    Generalized anxiety disorder 03/14/2021   Hypercholesteremia  Hyperkalemia    Hyperlipidemia 04/23/2019   Hypertensive heart and chronic kidney disease with systolic congestive heart failure (HCC)    Hypertensive heart disease 03/02/2019   Hyperthyroidism    Hypokalemia    Hypokalemia    Hypomagnesemia    Hyponatremia    Hypothyroidism 04/15/2020   Iron deficiency anemia    Leukocytosis 03/14/2021   Mild CAD    a. Coronary CT 04/2019 - Minimal, Non-obstructive CAD.   Mixed diabetic hyperlipidemia associated with type 2 diabetes mellitus (HCC) 03/14/2021   NICM  (nonischemic cardiomyopathy) (HCC)    Osteoarthritis 03/02/2019   PAF (paroxysmal atrial fibrillation) (HCC)    Persistent atrial fibrillation (HCC)    Pressure injury of skin 07/09/2019   Prolonged QT interval    Secondary adrenal insufficiency (HCC) 04/15/2020   Symptomatic anemia 06/28/2019   Type 2 diabetes mellitus with stage 3b chronic kidney disease, without long-term current use of insulin (HCC) 03/02/2019   Vitamin D deficiency 04/15/2020   Past Surgical History:  Procedure Laterality Date   BIOPSY  06/29/2019   Procedure: BIOPSY;  Surgeon: Hilarie Fredrickson, MD;  Location: [facility] ENDOSCOPY;  Service: Endoscopy;;   CARDIOVERSION N/A 04/29/2019   Procedure: CARDIOVERSION;  Surgeon: Thurmon Fair, MD;  Location: [facility] ENDOSCOPY;  Service: Cardiovascular;  Laterality: N/A;   CARDIOVERSION N/A 05/04/2019   Procedure: CARDIOVERSION;  Surgeon: Wendall Stade, MD;  Location: [facility] ENDOSCOPY;  Service: Cardiovascular;  Laterality: N/A;   COLONOSCOPY WITH PROPOFOL N/A 07/12/2019   Procedure: COLONOSCOPY WITH PROPOFOL;  Surgeon: Sherrilyn Rist, MD;  Location[facility] ENDOSCOPY;  Service: Gastroenterology;  Laterality: N/A;   ENTEROSCOPY N/A 07/10/2019   Procedure: ENTEROSCOPY;  Surgeon: Sherrilyn Rist, MD;  Location: [facility] ENDOSCOPY;  Service: Gastroenterology;  Laterality: N/A;   ESOPHAGOGASTRODUODENOSCOPY  06/29/2019   ESOPHAGOGASTRODUODENOSCOPY (EGD) WITH PROPOFOL N/A 06/29/2019   Procedure: ESOPHAGOGASTRODUODENOSCOPY (EGD) WITH PROPOFOL;  Surgeon: Hilarie Fredrickson, MD;  Location: [facility] ENDOSCOPY;  Service: Endoscopy;  Laterality: N/A;   GIVENS CAPSULE STUDY N/A 07/12/2019   Procedure: GIVENS CAPSULE STUDY;  Surgeon: Sherrilyn Rist, MD;  Location: [facility] ENDOSCOPY;  Service: Gastroenterology;  Laterality: N/A;   HEMOSTASIS CLIP PLACEMENT  07/12/2019   Procedure: HEMOSTASIS CLIP PLACEMENT;  Surgeon: Sherrilyn Rist, MD;  Location: [facility] ENDOSCOPY;  Service: Gastroenterology;;   INTRAMEDULLARY (IM) NAIL  INTERTROCHANTERIC Right 03/15/2021   Procedure: INTRAMEDULLARY (IM) NAIL INTERTROCHANTRIC;  Surgeon: Roby Lofts, MD;  Location: [facility] OR;  Service: Orthopedics;  Laterality: Right;   POLYPECTOMY  07/12/2019   Procedure: POLYPECTOMY;  Surgeon: Sherrilyn Rist, MD;  Location: [facility] ENDOSCOPY;  Service: Gastroenterology;;   SMALL BOWEL ENTEROSCOPY  07/10/2019   SUBMUCOSAL TATTOO INJECTION  07/10/2019   Procedure: SUBMUCOSAL TATTOO INJECTION;  Surgeon: Sherrilyn Rist, MD;  Location: [facility] ENDOSCOPY;  Service: Gastroenterology;;   TEE WITHOUT CARDIOVERSION  04/29/2019   TEE WITHOUT CARDIOVERSION N/A 04/29/2019   Procedure: TRANSESOPHAGEAL ECHOCARDIOGRAM (TEE);  Surgeon: Thurmon Fair, MD;  Location: [facility] ENDOSCOPY;  Service: Cardiovascular;  Laterality: N/A;   TUBAL LIGATION     Social History:  reports that she quit smoking about 24 years ago. Her smoking use included cigarettes. She started smoking about 54 years ago. She has a 60 pack-year smoking history. She has never used smokeless tobacco. She reports that she does not drink alcohol and does not use drugs.  Allergies  Allergen Reactions   Ambien [Zolpidem Tartrate]     Pt and family state this med makes the  patient sleepwalk.   Mobic [Meloxicam] Nausea Only    GI pain   Adhesive [Tape]     Tears skin, please use paper tape   Amiodarone Other (See Comments)    thyrotoxicosis   Codeine Nausea And Vomiting   Erythromycin Base     Other Reaction(s): Hives*   Nsaids     Other Reaction(s): See Comments   Sulfa Antibiotics     Other Reaction(s): Hives*   Tetracycline     Other Reaction(s): Hives*    Family History  Problem Relation Age of Onset   Cancer Mother    Heart disease Mother    Hypercholesterolemia Mother    Osteoarthritis Mother    Stroke Mother    Heart disease Father    Hypercholesterolemia Brother     Prior to Admission medications   Medication Sig Start Date End Date Taking? Authorizing Provider  acetaminophen  (TYLENOL) 325 MG tablet Take 650 mg by mouth every 6 (six) hours as needed for mild pain.    [provider]  ALPRAZolam Prudy Feeler) 0.5 MG tablet Take 0.5 mg by mouth 4 (four) times daily. 08/20/23   [provider]  apixaban (ELIQUIS) 5 MG TABS tablet Take 1 tablet (5 mg total) by mouth 2 (two) times daily. 10/12/19   Camnitz, Andree Coss, MD  baclofen (LIORESAL) 10 MG tablet Take 10 mg by mouth 2 (two) times daily as needed for muscle spasms. 12/31/22   [provider]  dapagliflozin propanediol (FARXIGA) 10 MG TABS tablet Take 1 tablet by mouth daily. 09/21/23   [provider]  diltiazem (TIAZAC) 360 MG 24 hr capsule Take 360 mg by mouth daily. 09/21/23 12/20/23  [provider]  fenofibrate (TRICOR) 48 MG tablet Take 48 mg by mouth daily.    [provider]  furosemide (LASIX) 20 MG tablet Take 1 tablet (20 mg total) by mouth 2 (two) times daily. 10/04/23   Baldo Daub, MD  hydrocortisone (CORTEF) 10 MG tablet Take 10 mg by mouth 2 (two) times daily.    [provider]  levETIRAcetam (KEPPRA) 500 MG tablet Take 500 mg by mouth 2 (two) times daily. 03/02/21   [provider]  magnesium oxide (MAG-OX) 400 MG tablet Take 400 mg by mouth daily. 04/21/23   [provider]  metFORMIN (GLUCOPHAGE) 1000 MG tablet Take 1,000 mg by mouth daily as needed (sugar).    [provider]  metoprolol succinate (TOPROL-XL) 100 MG 24 hr tablet Take 1 tablet (100 mg total) by mouth daily. Take with or immediately following a meal. 10/12/19   Camnitz, Andree Coss, MD  Multiple Vitamin (MULTIVITAMIN) tablet Take 1 tablet by mouth daily.    [provider]  Multiple Vitamins-Minerals (HAIR SKIN & NAILS PO) Take 1 tablet by mouth daily.    [provider]  pantoprazole (PROTONIX) 40 MG tablet Take 40 mg by mouth 2 (two) times daily.  10/15/19   [provider]  pregabalin (LYRICA) 100 MG capsule Take 100 mg by mouth 2  (two) times daily.    [provider]  promethazine (PHENERGAN) 25 MG tablet Take 25 mg by mouth every 6 (six) hours as needed for nausea or vomiting.  02/12/20   [provider]  rosuvastatin (CRESTOR) 10 MG tablet Take 1 tablet by mouth daily. 09/21/23   [provider]  sertraline (ZOLOFT) 25 MG tablet Take 1 tablet by mouth daily. 06/16/23   [provider]  traMADol (ULTRAM) 50 MG tablet  Take 50 mg by mouth every 6 (six) hours as needed for moderate pain (pain score 4-6) or severe pain (pain score 7-10).    [provider]    Physical Exam: Vitals:   10/15/23 1539 10/15/23 1645 10/15/23 1942 10/15/23 1945  BP: (!) 136/111 115/68  131/70  Pulse: 92 98  94  Resp: 14 14  (!) 24  Temp: (!) 97.4 F (36.3 C)  (!) 97.3 F (36.3 C)   TempSrc: Axillary     SpO2: 98% 99%  98%  Weight:       Physical Exam:  General: obtunded, sallow, chronically unwell appearing HEENT: Normocephalic, atraumatic, PERRL Cardiovascular: Normal rate and irregular rhythm. Distal pulses intact. Pulmonary: Normal pulmonary effort, normal breath sounds Gastrointestinal: Nondistended abdomen, soft, non-tender, normoactive bowel sounds Musculoskeletal:No lower ext edema Skin: Skin is warm and dry. Neuro: Opens eyes to loud voice. No consistent eye contact, Eyes close back quickly [facility]: unresponsive, No spontaneous movements.  Data Reviewed:  Results for orders placed or performed during the hospital encounter of 10/15/23 (from the past 24 hours)  CBG monitoring, ED     Status: Abnormal   Collection Time: 10/15/23  3:13 PM  Result Value Ref Range   Glucose-Capillary 105 (H) 70 - 99 mg/dL  Protime-INR     Status: None   Collection Time: 10/15/23  3:20 PM  Result Value Ref Range   Prothrombin Time 15.1 11.4 - 15.2 seconds   INR 1.2 0.8 - 1.2  APTT     Status: None   Collection Time: 10/15/23  3:20 PM  Result Value Ref Range   aPTT 30 24 - 36 seconds  CBC      Status: Abnormal   Collection Time: 10/15/23  3:20 PM  Result Value Ref Range   WBC 5.1 4.0 - 10.5 K/uL   RBC 3.58 (L) 3.87 - 5.11 MIL/uL   Hemoglobin 10.3 (L) 12.0 - 15.0 g/dL   HCT 19.1 (L) 47.8 - 29.5 %   MCV 96.1 80.0 - 100.0 fL   MCH 28.8 26.0 - 34.0 pg   MCHC 29.9 (L) 30.0 - 36.0 g/dL   RDW 62.1 30.8 - 65.7 %   Platelets 239 150 - 400 K/uL   nRBC 0.0 0.0 - 0.2 %  Differential     Status: None   Collection Time: 10/15/23  3:20 PM  Result Value Ref Range   Neutrophils Relative % 65 %   Neutro Abs 3.4 1.7 - 7.7 K/uL   Lymphocytes Relative 23 %   Lymphs Abs 1.1 0.7 - 4.0 K/uL   Monocytes Relative 7 %   Monocytes Absolute 0.4 0.1 - 1.0 K/uL   Eosinophils Relative 3 %   Eosinophils Absolute 0.1 0.0 - 0.5 K/uL   Basophils Relative 1 %   Basophils Absolute 0.0 0.0 - 0.1 K/uL   Immature Granulocytes 1 %   Abs Immature Granulocytes 0.03 0.00 - 0.07 K/uL  Comprehensive metabolic panel     Status: Abnormal   Collection Time: 10/15/23  3:20 PM  Result Value Ref Range   Sodium 143 135 - 145 mmol/L   Potassium 4.4 3.5 - 5.1 mmol/L   Chloride 100 98 - 111 mmol/L   CO2 34 (H) 22 - 32 mmol/L   Glucose, Bld 108 (H) 70 - 99 mg/dL   BUN 10 8 - 23 mg/dL   Creatinine, Ser 8.46 (H) 0.44 - 1.00 mg/dL   Calcium 9.2 8.9 - 96.2 mg/dL   Total Protein 5.9 (  L) 6.5 - 8.1 g/dL   Albumin 3.3 (L) 3.5 - 5.0 g/dL   AST 17 15 - 41 U/L   ALT 15 0 - 44 U/L   Alkaline Phosphatase 40 38 - 126 U/L   Total Bilirubin 0.4 0.0 - 1.2 mg/dL   GFR, Estimated 49 (L) >60 mL/min   Anion gap 9 5 - 15  Ethanol     Status: None   Collection Time: 10/15/23  3:20 PM  Result Value Ref Range   Alcohol, Ethyl (B) <10 <10 mg/dL  TSH     Status: None   Collection Time: 10/15/23  3:20 PM  Result Value Ref Range   TSH 0.505 0.350 - 4.500 uIU/mL  I-stat chem 8, ED     Status: Abnormal   Collection Time: 10/15/23  3:22 PM  Result Value Ref Range   Sodium 140 135 - 145 mmol/L   Potassium 4.2 3.5 - 5.1 mmol/L    Chloride 99 98 - 111 mmol/L   BUN 12 8 - 23 mg/dL   Creatinine, Ser 4.09 (H) 0.44 - 1.00 mg/dL   Glucose, Bld 811 (H) 70 - 99 mg/dL   Calcium, Ion 9.14 (L) 1.15 - 1.40 mmol/L   TCO2 32 22 - 32 mmol/L   Hemoglobin 10.5 (L) 12.0 - 15.0 g/dL   HCT 78.2 (L) 95.6 - 21.3 %  Urinalysis, Routine w reflex microscopic -Urine, Clean Catch     Status: Abnormal   Collection Time: 10/15/23  3:37 PM  Result Value Ref Range   Color, Urine YELLOW YELLOW   APPearance CLEAR CLEAR   Specific Gravity, Urine 1.013 1.005 - 1.030   pH 6.0 5.0 - 8.0   Glucose, UA >=500 (A) NEGATIVE mg/dL   Hgb urine dipstick NEGATIVE NEGATIVE   Bilirubin Urine NEGATIVE NEGATIVE   Ketones, ur NEGATIVE NEGATIVE mg/dL   Protein, ur >=086 (A) NEGATIVE mg/dL   Nitrite NEGATIVE NEGATIVE   Leukocytes,Ua NEGATIVE NEGATIVE   RBC / HPF 0-5 0 - 5 RBC/hpf   WBC, UA 0-5 0 - 5 WBC/hpf   Bacteria, UA NONE SEEN NONE SEEN   Squamous Epithelial / HPF 0-5 0 - 5 /HPF   Mucus PRESENT    Hyaline Casts, UA PRESENT   Rapid urine drug screen (hospital performed)     Status: Abnormal   Collection Time: 10/15/23  3:37 PM  Result Value Ref Range   Opiates NONE DETECTED NONE DETECTED   Cocaine NONE DETECTED NONE DETECTED   Benzodiazepines POSITIVE (A) NONE DETECTED   Amphetamines NONE DETECTED NONE DETECTED   Tetrahydrocannabinol NONE DETECTED NONE DETECTED   Barbiturates NONE DETECTED NONE DETECTED  I-Stat CG4 Lactic Acid     Status: None   Collection Time: 10/15/23  3:47 PM  Result Value Ref Range   Lactic Acid, Venous 0.9 0.5 - 1.9 mmol/L  I-Stat venous blood gas, ([facility] ED, [facility], [facility])     Status: Abnormal   Collection Time: 10/15/23  3:49 PM  Result Value Ref Range   pH, Ven 7.440 (H) 7.25 - 7.43   pCO2, Ven 54.0 44 - 60 mmHg   pO2, Ven 179 (H) 32 - 45 mmHg   Bicarbonate 36.6 (H) 20.0 - 28.0 mmol/L   TCO2 38 (H) 22 - 32 mmol/L   O2 Saturation 100 %   Acid-Base Excess 11.0 (H) 0.0 - 2.0 mmol/L   Sodium 140 135 - 145 mmol/L   Potassium  4.4 3.5 - 5.1 mmol/L   Calcium, Ion 1.10 (  L) 1.15 - 1.40 mmol/L   HCT 32.0 (L) 36.0 - 46.0 %   Hemoglobin 10.9 (L) 12.0 - 15.0 g/dL   Sample type VENOUS   Ammonia     Status: None   Collection Time: 10/15/23  3:59 PM  Result Value Ref Range   Ammonia 32 9 - 35 umol/L  Resp panel by RT-PCR (RSV, Flu A&B, Covid) Urine, Clean Catch     Status: None   Collection Time: 10/15/23  4:34 PM   Specimen: Urine, Clean Catch; Nasal Swab  Result Value Ref Range   SARS Coronavirus 2 by RT PCR NEGATIVE NEGATIVE   Influenza A by PCR NEGATIVE NEGATIVE   Influenza B by PCR NEGATIVE NEGATIVE   Resp Syncytial Virus by PCR NEGATIVE NEGATIVE     Assessment and Plan: Acute encephalopathy/ nearly obtunded - Cause unclear; Brain MRI normal. No sign of sepsis. Labs unremarkable - EEG completed. Results pending.  Neurology has signed off - Vitals are stable. She is protecting her airway.  Will monitor on telemetry for now.  Adrenal insufficiency -will start her on scheduled IV hydrocortisone Q8. Her vitals and labs are stable.   Advance Care Planning:   Code Status: Prior the patient was not able to participate in discussion therefore she will be full code by default.  Consults: neuro  Family Communication: none  Severity of Illness: The appropriate patient status for this patient is INPATIENT. Inpatient status is judged to be reasonable and necessary in order to provide the required intensity of service to ensure the patient's safety. The patient's presenting symptoms, physical exam findings, and initial radiographic and laboratory data in the context of their chronic comorbidities is felt to place them at high risk for further clinical deterioration. Furthermore, it is not anticipated that the patient will be medically stable for discharge from the hospital within 2 midnights of admission.   * I certify that at the point of admission it is my clinical judgment that the patient will require inpatient  hospital care spanning beyond 2 midnights from the point of admission due to high intensity of service, high risk for further deterioration and high frequency of surveillance required.*  Author: Buena Irish, MD 10/15/2023 9:08 PM  For on call review [email or web address].

## 2023-10-15 NOTE — Telephone Encounter (Signed)
 Brianna Porter is following up. She has been with patient for about 30 minutes now. She's breathing heavily, but they are unable to wake her up. Call transferred to Gerlene Burdock, RN.

## 2023-10-15 NOTE — Telephone Encounter (Signed)
 Left message for the patient to call back.

## 2023-10-15 NOTE — Code Documentation (Signed)
 Brianna Porter is a 63 yr old female with a PMH of  AF, DM, anxiety, "convulsions" encephalopathy, CKD, CAD, GIB NICM arriving to [facility] via EMS on 10/15/23. Pt is coming from home where she was LKW last night at 2100. Her sister found her with altered LOC today at 1430, and EMS was activated. Records show patient has been prescribed Eliquis.     Pt met at bridge by Stroke Team. Labs, CBG obtained. Airway cleared by EDP. Pt to CT with team. NIHSS 21. Pt with decreased LOC, weakness all over, not speaking. (Please see documentation for NIHSS details and timeline). The following imaging was obtained: CT. Per Dr. Iver Nestle, CT is negative for hemorrhage.     Pt back to ED room 16 where her workup will continue. She will need q 2 hr VS and NIHSS for 12 hrs, then q 4. Bedside handoff with Paden RN complete.

## 2023-10-16 DIAGNOSIS — R569 Unspecified convulsions: Secondary | ICD-10-CM | POA: Diagnosis not present

## 2023-10-16 DIAGNOSIS — G9340 Encephalopathy, unspecified: Secondary | ICD-10-CM | POA: Diagnosis not present

## 2023-10-16 LAB — I-STAT ARTERIAL BLOOD GAS, ED
Acid-Base Excess: 11 mmol/L — ABNORMAL HIGH (ref 0.0–2.0)
Acid-Base Excess: 8 mmol/L — ABNORMAL HIGH (ref 0.0–2.0)
Bicarbonate: 38.7 mmol/L — ABNORMAL HIGH (ref 20.0–28.0)
Bicarbonate: 36.6 mmol/L — ABNORMAL HIGH (ref 20.0–28.0)
Calcium, Ion: 1.2 mmol/L (ref 1.15–1.40)
Calcium, Ion: 1.28 mmol/L (ref 1.15–1.40)
HCT: 34 % — ABNORMAL LOW (ref 36.0–46.0)
HCT: 33 % — ABNORMAL LOW (ref 36.0–46.0)
Hemoglobin: 11.6 g/dL — ABNORMAL LOW (ref 12.0–15.0)
Hemoglobin: 11.2 g/dL — ABNORMAL LOW (ref 12.0–15.0)
O2 Saturation: 75 %
O2 Saturation: 99 %
Patient temperature: 98.9
Patient temperature: 98.4
Potassium: 4.5 mmol/L (ref 3.5–5.1)
Potassium: 4.3 mmol/L (ref 3.5–5.1)
Sodium: 142 mmol/L (ref 135–145)
Sodium: 140 mmol/L (ref 135–145)
TCO2: 41 mmol/L — ABNORMAL HIGH (ref 22–32)
TCO2: 39 mmol/L — ABNORMAL HIGH (ref 22–32)
pCO2 arterial: 70.6 mm[Hg] (ref 32–48)
pCO2 arterial: 72.5 mm[Hg] (ref 32–48)
pH, Arterial: 7.348 — ABNORMAL LOW (ref 7.35–7.45)
pH, Arterial: 7.31 — ABNORMAL LOW (ref 7.35–7.45)
pO2, Arterial: 44 mm[Hg] — ABNORMAL LOW (ref 83–108)
pO2, Arterial: 187 mm[Hg] — ABNORMAL HIGH (ref 83–108)

## 2023-10-16 MED ORDER — PANTOPRAZOLE SODIUM 40 MG PO TBEC
40.0000 mg | DELAYED_RELEASE_TABLET | Freq: Two times a day (BID) | ORAL | Status: DC
  Administered 2023-10-16 – 2023-10-17 (×3): 40 mg via ORAL
  Filled 2023-10-16 (×3): qty 1

## 2023-10-16 MED ORDER — ROSUVASTATIN CALCIUM 5 MG PO TABS
10.0000 mg | ORAL_TABLET | Freq: Every day | ORAL | Status: DC
  Administered 2023-10-16 – 2023-10-17 (×2): 10 mg via ORAL
  Filled 2023-10-16 (×2): qty 2

## 2023-10-16 MED ORDER — METOPROLOL SUCCINATE ER 100 MG PO TB24
100.0000 mg | ORAL_TABLET | Freq: Every day | ORAL | Status: DC
  Administered 2023-10-16 – 2023-10-17 (×2): 100 mg via ORAL
  Filled 2023-10-16: qty 4
  Filled 2023-10-16: qty 1

## 2023-10-16 MED ORDER — PREGABALIN 100 MG PO CAPS
100.0000 mg | ORAL_CAPSULE | Freq: Two times a day (BID) | ORAL | Status: DC
  Administered 2023-10-16 – 2023-10-17 (×2): 100 mg via ORAL
  Filled 2023-10-16 (×2): qty 1

## 2023-10-16 MED ORDER — DILTIAZEM HCL 25 MG/5ML IV SOLN
15.0000 mg | Freq: Once | INTRAVENOUS | Status: AC
  Administered 2023-10-16: 15 mg via INTRAVENOUS
  Filled 2023-10-16: qty 5

## 2023-10-16 MED ORDER — DILTIAZEM HCL-DEXTROSE 125-5 MG/125ML-% IV SOLN (PREMIX)
5.0000 mg/h | INTRAVENOUS | Status: DC
  Administered 2023-10-16: 5 mg/h via INTRAVENOUS
  Administered 2023-10-16 (×2): 15 mg/h via INTRAVENOUS
  Filled 2023-10-16 (×5): qty 125

## 2023-10-16 MED ORDER — MAGNESIUM OXIDE -MG SUPPLEMENT 400 (240 MG) MG PO TABS
400.0000 mg | ORAL_TABLET | Freq: Every day | ORAL | Status: DC
  Administered 2023-10-16 – 2023-10-17 (×2): 400 mg via ORAL
  Filled 2023-10-16 (×2): qty 1

## 2023-10-16 MED ORDER — LEVETIRACETAM 500 MG PO TABS
500.0000 mg | ORAL_TABLET | Freq: Two times a day (BID) | ORAL | Status: DC
  Administered 2023-10-16 – 2023-10-17 (×2): 500 mg via ORAL
  Filled 2023-10-16 (×2): qty 1

## 2023-10-16 MED ORDER — ALPRAZOLAM 0.5 MG PO TABS
0.5000 mg | ORAL_TABLET | Freq: Four times a day (QID) | ORAL | Status: DC | PRN
  Administered 2023-10-16: 0.5 mg via ORAL
  Filled 2023-10-16: qty 1

## 2023-10-16 MED ORDER — APIXABAN 5 MG PO TABS
5.0000 mg | ORAL_TABLET | Freq: Two times a day (BID) | ORAL | Status: DC
  Administered 2023-10-16 – 2023-10-17 (×3): 5 mg via ORAL
  Filled 2023-10-16 (×3): qty 1

## 2023-10-16 NOTE — ED Notes (Signed)
 Pt's oxygen saturation not reading on monitor and when a reading is displayed it reads in the low 80's then jumps to 100%. After a few minutes it will read in the low 80s again and then not read at all. Cords were changed x2 and sensors, with the same thing occurring. MD Opyd paged and ABG ordered.

## 2023-10-16 NOTE — Progress Notes (Signed)
 ABG results given to RN.  RN messaged doctor.

## 2023-10-16 NOTE — Procedures (Signed)
 Patient Name: Brianna Porter  MRN: [id number]  Epilepsy Attending: Charlsie Quest  Referring Physician/Provider: Marjorie Smolder, NP  Date: 10/15/2023 Duration: 24.03 mins  Patient history: 63yo F with ams. EEG to evaluate for seizure  Level of alertness: Awake  AEDs during EEG study: None  Technical aspects: This EEG study was done with scalp electrodes positioned according to the 10-20 International system of electrode placement. Electrical activity was reviewed with band pass filter of 1-70Hz , sensitivity of 7 uV/mm, display speed of 14mm/sec with a 60Hz  notched filter applied as appropriate. EEG data were recorded continuously and digitally stored.  Video monitoring was available and reviewed as appropriate.  Description: EEG showed continuous generalized polymorphic 3 to 6 Hz theta-delta slowing. Hyperventilation and photic stimulation were not performed.     ABNORMALITY - Continuous slow, generalized  IMPRESSION: This study is suggestive of moderate diffuse encephalopathy. No seizures or epileptiform discharges were seen throughout the recording.  Rajesh Wyss Annabelle Harman

## 2023-10-16 NOTE — Progress Notes (Signed)
   10/16/23 1611  TOC Brief Assessment  Insurance and Status Reviewed ([employer])  Patient has primary care physician Yes Nedra Hai, Marquette Saa, MD)  Home environment has been reviewed From Home Alone  Prior level of function: Wheelchair Bound ; O2 Dependent  Prior/Current Home Services No current home services (Did have [facility] in the past)  Social Drivers of Health Review SDOH reviewed no interventions necessary  Transition of care needs transition of care needs identified, TOC will continue to follow   Anticipate patient may need HH. TOC will continue to follow patient for any discharge needs

## 2023-10-16 NOTE — Plan of Care (Signed)
 EEG ABNORMALITY - Continuous slow, generalized IMPRESSION: This study is suggestive of moderate diffuse encephalopathy. No seizures or epileptiform discharges were seen throughout the recording.  MRI brain No acute intracranial abnormality. Mild chronic microvascular ischemic changes.  Reached out to primary team via secure chat -- given reassuring studies will sign off, appreciate primary team reaching out if any new neurological questions/concerns  Brooke Dare MD-PhD [facility] [phone] Available 7 AM to 7 PM, outside these hours please contact Neurologist on call listed on AMION

## 2023-10-16 NOTE — Progress Notes (Signed)
 ABG redrawn.  Previous ABG possibly venous with pO2 being 44.   New ABG results:  pH 7.31 PCO2 72.5 PO2 187 HCO3 36.6

## 2023-10-16 NOTE — H&P (Signed)
 Patient placed on 8L salter. Tolerating well.

## 2023-10-16 NOTE — Progress Notes (Signed)
 PROGRESS NOTE    NADEEN Porter  WUJ:[id number] DOB: 10/03/1960 DOA: 10/15/2023 PCP: Simone Curia, MD   Brief Narrative: This 63 yrs old female with PMH significant for chronic atrial fibrillation on Eliquis, adrenal insufficiency, severe cardiomyopathy with LVEF of 40 to 45%, CKD stage IIIb, history of amiodarone induced thyrotoxicosis who was found minimally responsive at home by her sister today at approximately 2:30pm.  Patient lives alone and is wheelchair-bound.  A sister last saw her at baseline last night at about 9:30 PM.   The patient was hospitalized last month at [facility] for CHF and community-acquired pneumonia.  She also went into A-fib with RVR and had cardioversion.  She remains in A-fib but her rate is controlled.  The patient was discharged on 2 L O2 nasal cannula.   She was brought in the ED as a code stroke due to her altered mental status. Workup so far unremarkable.  MRI negative for acute infarct.  EEG no evidence of seizures.  Stroke was ruled out.  She was admitted for further evaluation.Her tox screen is positive for benzodiazepines but Xanax is one of her routine medications.    Assessment & Plan:   Principal Problem:   Acute encephalopathy Active Problems:   Type 2 diabetes mellitus with stage 3b chronic kidney disease, without long-term current use of insulin (HCC)   Persistent atrial fibrillation (HCC)  Acute encephalopathy: Cause unclear; Brain MRI normal. No sign of sepsis. Labs unremarkable,  EEG completed.  No evidence of seizures.  Neurology has signed off Vitals are stable. She is protecting her airway.  Will monitor on telemetry for now. She appears back to her baseline mental status.   Adrenal insufficiency:  Noted in history but she was not taking steroids or any medication for this.   Started her on IV hydrocortisone 75mg  q12h Her vitals and labs are stable.   Atrial fibrillation: Heart rate is not controlled ,  Continue  Cardizem gtt. Continue Eliquis Continue metoprolol 100 mg daily    DVT prophylaxis: Eliquis Code Status: Full code Family Communication: No family at bedside Disposition Plan:   Status is: Inpatient Remains inpatient appropriate because: Admitted for altered mental status,  brought as a code stroke which was ruled out. Now improving.     Consultants:  Neurology  Procedures: MRI , EEG  Antimicrobials:  Anti-infectives (From admission, onward)    None       Subjective: Patient was seen and examined at bedside.  Overnight events noted.   Patient reports doing much improved. She still appears slightly drowsy.  Objective: Vitals:   10/16/23 1300 10/16/23 1315 10/16/23 1320 10/16/23 1400  BP: (!) 141/57 (!) 146/91 (!) 146/91 131/64  Pulse:  98 96 94  Resp: 13 17 18 15   Temp:   98.3 F (36.8 C) 98.2 F (36.8 C)  TempSrc:   Oral Oral  SpO2:  98% 100% 100%  Weight:        Intake/Output Summary (Last 24 hours) at 10/16/2023 1607 Last data filed at 10/16/2023 0705 Gross per 24 hour  Intake 45.06 ml  Output 250 ml  Net -204.94 ml   Filed Weights   10/15/23 1521  Weight: 67.4 kg    Examination:  General exam: Appears calm and comfortable , deconditioned, not in any acute distress. Respiratory system: Clear to auscultation. Respiratory effort normal.  RR 16 Cardiovascular system: S1 & S2 heard, RRR. No JVD, murmurs, rubs, gallops or clicks.  Gastrointestinal system: Abdomen is  non distended, soft and non tender.  Normal bowel sounds heard. Central nervous system: Alert and oriented x 3. No focal neurological deficits. Extremities: No Edema, no cyanosis, no clubbing. Skin: No rashes, lesions or ulcers Psychiatry: Judgement and insight appear normal. Mood & affect appropriate.     Data Reviewed: I have personally reviewed following labs and imaging studies  CBC: Recent Labs  Lab 10/15/23 1520 10/15/23 1522 10/15/23 1549 10/16/23 0501 10/16/23 0552  WBC  5.1  --   --   --   --   NEUTROABS 3.4  --   --   --   --   HGB 10.3* 10.5* 10.9* 11.6* 11.2*  HCT 34.4* 31.0* 32.0* 34.0* 33.0*  MCV 96.1  --   --   --   --   PLT 239  --   --   --   --    Basic Metabolic Panel: Recent Labs  Lab 10/15/23 1520 10/15/23 1522 10/15/23 1549 10/16/23 0501 10/16/23 0552  NA 143 140 140 142 140  K 4.4 4.2 4.4 4.5 4.3  CL 100 99  --   --   --   CO2 34*  --   --   --   --   GLUCOSE 108* 100*  --   --   --   BUN 10 12  --   --   --   CREATININE 1.25* 1.40*  --   --   --   CALCIUM 9.2  --   --   --   --    GFR: Estimated Creatinine Clearance: 38.4 mL/min (A) (by C-G formula based on SCr of 1.4 mg/dL (H)). Liver Function Tests: Recent Labs  Lab 10/15/23 1520  AST 17  ALT 15  ALKPHOS 40  BILITOT 0.4  PROT 5.9*  ALBUMIN 3.3*   No results for input(s): "LIPASE", "AMYLASE" in the last 168 hours. Recent Labs  Lab 10/15/23 1559  AMMONIA 32   Coagulation Profile: Recent Labs  Lab 10/15/23 1520  INR 1.2   Cardiac Enzymes: No results for input(s): "CKTOTAL", "CKMB", "CKMBINDEX", "TROPONINI" in the last 168 hours. BNP (last 3 results) No results for input(s): "PROBNP" in the last 8760 hours. HbA1C: No results for input(s): "HGBA1C" in the last 72 hours. CBG: Recent Labs  Lab 10/15/23 1513  GLUCAP 105*   Lipid Profile: No results for input(s): "CHOL", "HDL", "LDLCALC", "TRIG", "CHOLHDL", "LDLDIRECT" in the last 72 hours. Thyroid Function Tests: Recent Labs    10/15/23 1520  TSH 0.505   Anemia Panel: No results for input(s): "VITAMINB12", "FOLATE", "FERRITIN", "TIBC", "IRON", "RETICCTPCT" in the last 72 hours. Sepsis Labs: Recent Labs  Lab 10/15/23 1547  LATICACIDVEN 0.9    Recent Results (from the past 240 hours)  Resp panel by RT-PCR (RSV, Flu A&B, Covid) Urine, Clean Catch     Status: None   Collection Time: 10/15/23  4:34 PM   Specimen: Urine, Clean Catch; Nasal Swab  Result Value Ref Range Status   SARS Coronavirus 2  by RT PCR NEGATIVE NEGATIVE Final   Influenza A by PCR NEGATIVE NEGATIVE Final   Influenza B by PCR NEGATIVE NEGATIVE Final    Comment: (NOTE) The Xpert Xpress SARS-CoV-2/FLU/RSV plus assay is intended as an aid in the diagnosis of influenza from Nasopharyngeal swab specimens and should not be used as a sole basis for treatment. Nasal washings and aspirates are unacceptable for Xpert Xpress SARS-CoV-2/FLU/RSV testing.  Fact Sheet for Patients: BloggerCourse.com  Fact Sheet for Healthcare  Providers: SeriousBroker.it  This test is not yet approved or cleared by the [place] and has been authorized for detection and/or diagnosis of SARS-CoV-2 by FDA under an Emergency Use Authorization (EUA). This EUA will remain in effect (meaning this test can be used) for the duration of the COVID-19 declaration under Section 564(b)(1) of the Act, 21 U.S.C. section 360bbb-3(b)(1), unless the authorization is terminated or revoked.     Resp Syncytial Virus by PCR NEGATIVE NEGATIVE Final    Comment: (NOTE) Fact Sheet for Patients: BloggerCourse.com  Fact Sheet for Healthcare Providers: SeriousBroker.it  This test is not yet approved or cleared by the [place] FDA and has been authorized for detection and/or diagnosis of SARS-CoV-2 by FDA under an Emergency Use Authorization (EUA). This EUA will remain in effect (meaning this test can be used) for the duration of the COVID-19 declaration under Section 564(b)(1) of the Act, 21 U.S.C. section 360bbb-3(b)(1), unless the authorization is terminated or revoked.  Performed at [facility], [address] [place], [place] [address]     Radiology Studies: EEG adult Result Date: 10/16/2023 Charlsie Quest, MD     10/16/2023  8:55 AM Patient Name: Brianna Porter MRN: [id number] Epilepsy Attending: Charlsie Quest Referring  Physician/Provider: Marjorie Smolder, NP Date: 10/15/2023 Duration: 24.03 mins Patient history: 63yo F with ams. EEG to evaluate for seizure Level of alertness: Awake AEDs during EEG study: None Technical aspects: This EEG study was done with scalp electrodes positioned according to the 10-20 International system of electrode placement. Electrical activity was reviewed with band pass filter of 1-70Hz , sensitivity of 7 uV/mm, display speed of 61mm/sec with a 60Hz  notched filter applied as appropriate. EEG data were recorded continuously and digitally stored.  Video monitoring was available and reviewed as appropriate. Description: EEG showed continuous generalized polymorphic 3 to 6 Hz theta-delta slowing. Hyperventilation and photic stimulation were not performed.   ABNORMALITY - Continuous slow, generalized IMPRESSION: This study is suggestive of moderate diffuse encephalopathy. No seizures or epileptiform discharges were seen throughout the recording. Charlsie Quest   MR BRAIN WO CONTRAST Result Date: 10/15/2023 CLINICAL DATA:  Possible stroke, found unconscious. EXAM: MRI HEAD WITHOUT CONTRAST TECHNIQUE: Multiplanar, multiecho pulse sequences of the brain and surrounding structures were obtained without intravenous contrast. COMPARISON:  CT head earlier same day.  MRI head 03/05/2020 FINDINGS: Brain: No acute infarct. No evidence of intracranial hemorrhage. Scattered FLAIR hyperintensity in the periventricular and subcortical white matter suggestive of mild chronic microvascular ischemic changes overall similar to prior. No mass lesion or midline shift. Cerebellum is unremarkable. Normal appearance of midline structures. The basilar cisterns are patent. No extra-axial fluid collections. Ventricles: Normal size and configuration of the ventricles. Vascular: Skull base flow voids are visualized. Skull and upper cervical spine: No focal abnormality. Sinuses/Orbits: Orbits are symmetric. Paranasal sinuses  are clear. Other: Trace fluid in the mastoid air cells. IMPRESSION: No acute intracranial abnormality. Mild chronic microvascular ischemic changes. Electronically Signed   By: Emily Filbert M.D.   On: 10/15/2023 18:27   DG Chest Portable 1 View Result Date: 10/15/2023 CLINICAL DATA:  Altered mental status EXAM: PORTABLE CHEST 1 VIEW COMPARISON:  X-ray and CTA 09/16/2023 FINDINGS: Hyperinflation. No pneumothorax, consolidation or edema. No clear pleural effusions. Minimal basilar atelectasis. Stable cardiopericardial silhouette. Overlapping cardiac leads. IMPRESSION: Hyperinflation. No acute cardiopulmonary disease. Minimal basilar atelectasis. Electronically Signed   By: Karen Kays M.D.   On: 10/15/2023 16:48   CT  HEAD CODE STROKE WO CONTRAST Result Date: 10/15/2023 CLINICAL DATA:  Code stroke.  Neuro deficit, acute, stroke suspected EXAM: CT HEAD WITHOUT CONTRAST TECHNIQUE: Contiguous axial images were obtained from the base of the skull through the vertex without intravenous contrast. RADIATION DOSE REDUCTION: This exam was performed according to the departmental dose-optimization program which includes automated exposure control, adjustment of the mA and/or kV according to patient size and/or use of iterative reconstruction technique. COMPARISON:  Head CT 06/11/23 FINDINGS: Brain: No hemorrhage. No hydrocephalus. No extra-axial fluid collection. No mass effect. No mass lesion. Unchanged scattered subcortical white matter hypodensities. No CT evidence of an acute cortical infarct. Vascular: No hyperdense vessel or unexpected calcification. Skull: Normal. Negative for fracture or focal lesion. Sinuses/Orbits: Middle ear or mastoid effusion. Paranasal sinuses clear. Orbits are unremarkable Other: None. ASPECTS [facility] Stroke Program Early CT Score): 10 IMPRESSION: No hemorrhage or CT evidence of an acute cortical infarct. ASPECTS 10 Findings were paged to Dr. Iver Nestle on 10/15/23 at 3:36 PM Electronically  Signed   By: Lorenza Cambridge M.D.   On: 10/15/2023 15:36   Scheduled Meds:  apixaban  5 mg Oral BID   hydrocortisone sod succinate (SOLU-CORTEF) inj  75 mg Intravenous Q12H   magnesium oxide  400 mg Oral Daily   metoprolol succinate  100 mg Oral Daily   pantoprazole  40 mg Oral BID   rosuvastatin  10 mg Oral Daily   Continuous Infusions:  diltiazem (CARDIZEM) infusion 15 mg/hr (10/16/23 1329)     LOS: 1 day    Time spent: 50 mins.    Willeen Niece, MD [facility]   If 7PM-7AM, please contact night-coverage

## 2023-10-16 NOTE — ED Notes (Signed)
 Pt to be transported to 5wbed 20, pt on  cardiac monitor

## 2023-10-16 NOTE — Progress Notes (Signed)
 Patient refusing BiPAP.  Patient states "she will go home before she wears it."

## 2023-10-16 NOTE — ED Notes (Signed)
 Bipap ordered but patient refusing. MD Crosley notified

## 2023-10-17 ENCOUNTER — Other Ambulatory Visit (HOSPITAL_COMMUNITY): Payer: Self-pay

## 2023-10-17 ENCOUNTER — Inpatient Hospital Stay (HOSPITAL_COMMUNITY): Payer: PPO

## 2023-10-17 DIAGNOSIS — G9340 Encephalopathy, unspecified: Secondary | ICD-10-CM | POA: Diagnosis not present

## 2023-10-17 LAB — BASIC METABOLIC PANEL
Anion gap: 11 (ref 5–15)
BUN: 19 mg/dL (ref 8–23)
CO2: 34 mmol/L — ABNORMAL HIGH (ref 22–32)
Calcium: 9.4 mg/dL (ref 8.9–10.3)
Chloride: 94 mmol/L — ABNORMAL LOW (ref 98–111)
Creatinine, Ser: 1.39 mg/dL — ABNORMAL HIGH (ref 0.44–1.00)
GFR, Estimated: 43 mL/min — ABNORMAL LOW (ref >60)
Glucose, Bld: 177 mg/dL — ABNORMAL HIGH (ref 70–99)
Potassium: 4.4 mmol/L (ref 3.5–5.1)
Sodium: 139 mmol/L (ref 135–145)

## 2023-10-17 LAB — CBC
HCT: 33.3 % — ABNORMAL LOW (ref 36.0–46.0)
Hemoglobin: 10.2 g/dL — ABNORMAL LOW (ref 12.0–15.0)
MCH: 28.5 pg (ref 26.0–34.0)
MCHC: 30.6 g/dL (ref 30.0–36.0)
MCV: 93 fL (ref 80.0–100.0)
Platelets: 249 10*3/uL (ref 150–400)
RBC: 3.58 MIL/uL — ABNORMAL LOW (ref 3.87–5.11)
RDW: 14.2 % (ref 11.5–15.5)
WBC: 6.3 10*3/uL (ref 4.0–10.5)
nRBC: 0 % (ref 0.0–0.2)

## 2023-10-17 LAB — MAGNESIUM: Magnesium: 1.9 mg/dL (ref 1.7–2.4)

## 2023-10-17 LAB — PHOSPHORUS: Phosphorus: 3.3 mg/dL (ref 2.5–4.6)

## 2023-10-17 MED ORDER — DILTIAZEM HCL ER COATED BEADS 120 MG PO CP24
240.0000 mg | ORAL_CAPSULE | Freq: Every day | ORAL | Status: DC
  Administered 2023-10-17: 240 mg via ORAL
  Filled 2023-10-17: qty 2

## 2023-10-17 MED ORDER — HYDROCORTISONE 10 MG PO TABS
10.0000 mg | ORAL_TABLET | Freq: Two times a day (BID) | ORAL | 0 refills | Status: DC
  Filled 2023-10-17: qty 60, 30d supply, fill #0

## 2023-10-17 MED ORDER — PREGABALIN 100 MG PO CAPS
100.0000 mg | ORAL_CAPSULE | Freq: Every day | ORAL | Status: DC
Start: 2023-10-17 — End: 2023-10-29

## 2023-10-17 MED ORDER — LEVETIRACETAM 500 MG PO TABS
500.0000 mg | ORAL_TABLET | Freq: Two times a day (BID) | ORAL | 0 refills | Status: DC
  Filled 2023-10-17: qty 60, 30d supply, fill #0

## 2023-10-17 MED ORDER — ALPRAZOLAM 0.5 MG PO TABS: 0.5000 mg | ORAL_TABLET | Freq: Two times a day (BID) | ORAL | Status: DC | PRN

## 2023-10-17 MED ORDER — ALPRAZOLAM 0.5 MG PO TABS: 0.5000 mg | ORAL_TABLET | Freq: Two times a day (BID) | ORAL | Status: DC

## 2023-10-17 NOTE — Discharge Summary (Signed)
 Brianna Porter WJX:[id number] DOB: 08/06/1961 DOA: 10/15/2023  PCP: Simone Curia, MD  Admit date: 10/15/2023  Discharge date: 10/17/2023  Admitted From: Home   Disposition:  Home   Recommendations for Outpatient Follow-up:   Follow up with PCP in 1-2 weeks  PCP Please obtain BMP/CBC, 2 view CXR in 1week,  (see Discharge instructions)   PCP Please follow up on the following pending results:    Home Health: PT, RN if qulifies   Equipment/Devices:  walker Consultations: None  Discharge Condition: Stable    CODE STATUS: Full    Diet Recommendation: Heart Healthy     Chief Complaint  Patient presents with   Code Stroke     Brief history of present illness from the day of admission and additional interim summary    63 yrs old female with PMH significant for chronic atrial fibrillation on Eliquis, adrenal insufficiency, severe cardiomyopathy with LVEF of 40 to 45%, CKD stage IIIb, history of amiodarone induced thyrotoxicosis who was found minimally responsive at home by her sister today at approximately 2:30pm.  Patient lives alone and is wheelchair-bound.  A sister last saw her at baseline last night at about 9:30 PM.    The patient was hospitalized last month at [facility] for CHF and community-acquired pneumonia.  She also went into A-fib with RVR and had cardioversion.  She remains in A-fib but her rate is controlled.  The patient was discharged on 2 L O2 nasal cannula.    She was brought in his mental status, MRI, EEG unremarkable, she likely had accidental polypharmacy.                                                                 Hospital Course   Acute encephalopathy: Initially thought to be due to stroke, MRI brain EEG negative, seen and cleared for discharge by neurology, further  workup indicates that this was likely incidental polypharmacy from being on high doses of benzodiazepine, Phenergan, muscle relaxants and Lyrica, according to the brother this has happened before, she has been counseled to avoid accidental overdose, she is now back to her baseline and adamant to be discharged immediately, brother says she does it all the time when she is hospitalized to the hospital and lives as per she wishes, request PCP to cut down on sedating medications.  Dose adjusted prior to discharge patient strictly counseled.  Will try to order home PT and RN if she qualifies along with a walker.   Adrenal insufficiency: Home medication continued, she confirmed to me that she takes 2 pills of hydrocortisone every day for the last 3 years, refill provided.   Atrial fibrillation: Rate controlled continue home rate controlling medications along with Eliquis  CKD 3A.  Creatinine at baseline.  Continue home medications.  Discharge diagnosis     Principal Problem:   Acute encephalopathy Active Problems:   Type 2 diabetes mellitus with stage 3b chronic kidney disease, without long-term current use of insulin (HCC)   Persistent atrial fibrillation (HCC)    Discharge instructions    Discharge Instructions     Diet - low sodium heart healthy   Complete by: As directed    Discharge instructions   Complete by: As directed    Follow with Primary MD Simone Curia, MD in 7 days   Get CBC, CMP, 2 view Chest X ray -  checked next visit with your primary MD    Activity: As tolerated with Full fall precautions use walker/cane & assistance as needed  Disposition Home    Diet: Heart Healthy   Special Instructions: If you have smoked or chewed Tobacco  in the last 2 yrs please stop smoking, stop any regular Alcohol  and or any Recreational drug use.  On your next visit with your primary care physician please Get Medicines reviewed and adjusted.  Please request your Prim.MD to go over all  Hospital Tests and Procedure/Radiological results at the follow up, please get all Hospital records sent to your Prim MD by signing hospital release before you go home.  If you experience worsening of your admission symptoms, develop shortness of breath, life threatening emergency, suicidal or homicidal thoughts you must seek medical attention immediately by calling 911 or calling your MD immediately  if symptoms less severe.  You Must read complete instructions/literature along with all the possible adverse reactions/side effects for all the Medicines you take and that have been prescribed to you. Take any new Medicines after you have completely understood and accpet all the possible adverse reactions/side effects.   Do not drive when taking Pain medications.  Do not take more than prescribed Pain, Sleep and Anxiety Medications   Increase activity slowly   Complete by: As directed        Discharge Medications   Allergies as of 10/17/2023       Reactions   Ambien [zolpidem Tartrate]    Pt and family state this med makes the patient sleepwalk.   Mobic [meloxicam] Nausea Only   GI pain   Adhesive [tape]    Tears skin, please use paper tape   Amiodarone Other (See Comments)   thyrotoxicosis   Codeine Nausea And Vomiting   Erythromycin Base    Other Reaction(s): Hives*   Nsaids    Other Reaction(s): See Comments   Sulfa Antibiotics    Other Reaction(s): Hives*   Tetracycline    Other Reaction(s): Hives*        Medication List     STOP taking these medications    baclofen 10 MG tablet Commonly known as: LIORESAL   promethazine 25 MG tablet Commonly known as: PHENERGAN   traMADol 50 MG tablet Commonly known as: ULTRAM       TAKE these medications    acetaminophen 325 MG tablet Commonly known as: TYLENOL Take 650 mg by mouth every 6 (six) hours as needed for mild pain.   ALPRAZolam 0.5 MG tablet Commonly known as: XANAX Take 1 tablet (0.5 mg total) by mouth 2  (two) times daily. What changed: when to take this   apixaban 5 MG Tabs tablet Commonly known as: ELIQUIS Take 1 tablet (5 mg total) by mouth 2 (two) times daily.   dapagliflozin propanediol 10 MG Tabs tablet Commonly known as: FARXIGA Take 1 tablet by mouth  daily.   diltiazem 360 MG 24 hr capsule Commonly known as: TIAZAC Take 360 mg by mouth daily.   fenofibrate 48 MG tablet Commonly known as: TRICOR Take 48 mg by mouth daily.   furosemide 20 MG tablet Commonly known as: LASIX Take 1 tablet (20 mg total) by mouth 2 (two) times daily. What changed: when to take this   HAIR SKIN & NAILS PO Take 1 tablet by mouth daily.   hydrocortisone 10 MG tablet Commonly known as: CORTEF Take 1 tablet (10 mg total) by mouth 2 (two) times daily.   levETIRAcetam 500 MG tablet Commonly known as: KEPPRA Take 1 tablet (500 mg total) by mouth 2 (two) times daily.   magnesium oxide 400 MG tablet Commonly known as: MAG-OX Take 400 mg by mouth daily.   metFORMIN 1000 MG tablet Commonly known as: GLUCOPHAGE Take 1,000 mg by mouth daily as needed (sugar).   metoprolol succinate 100 MG 24 hr tablet Commonly known as: TOPROL-XL Take 1 tablet (100 mg total) by mouth daily. Take with or immediately following a meal.   multivitamin tablet Take 1 tablet by mouth daily.   pantoprazole 40 MG tablet Commonly known as: PROTONIX Take 40 mg by mouth 2 (two) times daily.   pregabalin 100 MG capsule Commonly known as: LYRICA Take 1 capsule (100 mg total) by mouth daily. What changed: when to take this   rosuvastatin 10 MG tablet Commonly known as: CRESTOR Take 1 tablet by mouth daily.   sertraline 25 MG tablet Commonly known as: ZOLOFT Take 1 tablet by mouth daily.               Durable Medical Equipment  (From admission, onward)           Start     Ordered   10/17/23 0822  DME Dan Humphreys  Once       Question Answer Comment  Walker: With 5 Inch Wheels   Patient needs a  walker to treat with the following condition Weakness      10/17/23 0823             Follow-up Information     Simone Curia, MD. Schedule an appointment as soon as possible for a visit in 1 week(s).   Specialty: Internal Medicine Contact information: [address] [place] [address] [phone]                 Major procedures and Radiology Reports - PLEASE review detailed and final reports thoroughly  -       DG Chest [facility] 1 View Result Date: 10/17/2023 CLINICAL DATA:  Shortness of breath.  CHF.  Stroke. EXAM: PORTABLE CHEST 1 VIEW COMPARISON:  10/15/2023 FINDINGS: Heart size and mediastinal contours are stable. No pleural fluid, interstitial edema or airspace disease. No pneumothorax. The visualized osseous structures are unremarkable. IMPRESSION: No active disease. Electronically Signed   By: Signa Kell M.D.   On: 10/17/2023 06:50   EEG adult Result Date: 10/16/2023 Charlsie Quest, MD     10/16/2023  8:55 AM Patient Name: CAMBRYN CHARTERS MRN: [id number] Epilepsy Attending: Charlsie Quest Referring Physician/Provider: Marjorie Smolder, NP Date: 10/15/2023 Duration: 24.03 mins Patient history: 63yo F with ams. EEG to evaluate for seizure Level of alertness: Awake AEDs during EEG study: None Technical aspects: This EEG study was done with scalp electrodes positioned according to the 10-20 International system of electrode placement. Electrical activity was reviewed with band pass  filter of 1-70Hz , sensitivity of 7 uV/mm, display speed of 83mm/sec with a 60Hz  notched filter applied as appropriate. EEG data were recorded continuously and digitally stored.  Video monitoring was available and reviewed as appropriate. Description: EEG showed continuous generalized polymorphic 3 to 6 Hz theta-delta slowing. Hyperventilation and photic stimulation were not performed.   ABNORMALITY - Continuous slow, generalized IMPRESSION: This study is suggestive of moderate  diffuse encephalopathy. No seizures or epileptiform discharges were seen throughout the recording. Charlsie Quest   MR BRAIN WO CONTRAST Result Date: 10/15/2023 CLINICAL DATA:  Possible stroke, found unconscious. EXAM: MRI HEAD WITHOUT CONTRAST TECHNIQUE: Multiplanar, multiecho pulse sequences of the brain and surrounding structures were obtained without intravenous contrast. COMPARISON:  CT head earlier same day.  MRI head 03/05/2020 FINDINGS: Brain: No acute infarct. No evidence of intracranial hemorrhage. Scattered FLAIR hyperintensity in the periventricular and subcortical white matter suggestive of mild chronic microvascular ischemic changes overall similar to prior. No mass lesion or midline shift. Cerebellum is unremarkable. Normal appearance of midline structures. The basilar cisterns are patent. No extra-axial fluid collections. Ventricles: Normal size and configuration of the ventricles. Vascular: Skull base flow voids are visualized. Skull and upper cervical spine: No focal abnormality. Sinuses/Orbits: Orbits are symmetric. Paranasal sinuses are clear. Other: Trace fluid in the mastoid air cells. IMPRESSION: No acute intracranial abnormality. Mild chronic microvascular ischemic changes. Electronically Signed   By: Emily Filbert M.D.   On: 10/15/2023 18:27   DG Chest Portable 1 View Result Date: 10/15/2023 CLINICAL DATA:  Altered mental status EXAM: PORTABLE CHEST 1 VIEW COMPARISON:  X-ray and CTA 09/16/2023 FINDINGS: Hyperinflation. No pneumothorax, consolidation or edema. No clear pleural effusions. Minimal basilar atelectasis. Stable cardiopericardial silhouette. Overlapping cardiac leads. IMPRESSION: Hyperinflation. No acute cardiopulmonary disease. Minimal basilar atelectasis. Electronically Signed   By: Karen Kays M.D.   On: 10/15/2023 16:48   CT HEAD CODE STROKE WO CONTRAST Result Date: 10/15/2023 CLINICAL DATA:  Code stroke.  Neuro deficit, acute, stroke suspected EXAM: CT HEAD WITHOUT  CONTRAST TECHNIQUE: Contiguous axial images were obtained from the base of the skull through the vertex without intravenous contrast. RADIATION DOSE REDUCTION: This exam was performed according to the departmental dose-optimization program which includes automated exposure control, adjustment of the mA and/or kV according to patient size and/or use of iterative reconstruction technique. COMPARISON:  Head CT 06/11/23 FINDINGS: Brain: No hemorrhage. No hydrocephalus. No extra-axial fluid collection. No mass effect. No mass lesion. Unchanged scattered subcortical white matter hypodensities. No CT evidence of an acute cortical infarct. Vascular: No hyperdense vessel or unexpected calcification. Skull: Normal. Negative for fracture or focal lesion. Sinuses/Orbits: Middle ear or mastoid effusion. Paranasal sinuses clear. Orbits are unremarkable Other: None. ASPECTS [facility] Stroke Program Early CT Score): 10 IMPRESSION: No hemorrhage or CT evidence of an acute cortical infarct. ASPECTS 10 Findings were paged to Dr. Iver Nestle on 10/15/23 at 3:36 PM Electronically Signed   By: Lorenza Cambridge M.D.   On: 10/15/2023 15:36    Micro Results     Recent Results (from the past 240 hours)  Resp panel by RT-PCR (RSV, Flu A&B, Covid) Urine, Clean Catch     Status: None   Collection Time: 10/15/23  4:34 PM   Specimen: Urine, Clean Catch; Nasal Swab  Result Value Ref Range Status   SARS Coronavirus 2 by RT PCR NEGATIVE NEGATIVE Final   Influenza A by PCR NEGATIVE NEGATIVE Final   Influenza B by PCR NEGATIVE NEGATIVE Final    Comment: (NOTE)  The Xpert Xpress SARS-CoV-2/FLU/RSV plus assay is intended as an aid in the diagnosis of influenza from Nasopharyngeal swab specimens and should not be used as a sole basis for treatment. Nasal washings and aspirates are unacceptable for Xpert Xpress SARS-CoV-2/FLU/RSV testing.  Fact Sheet for Patients: BloggerCourse.com  Fact Sheet for Healthcare  Providers: SeriousBroker.it  This test is not yet approved or cleared by the [place] FDA and has been authorized for detection and/or diagnosis of SARS-CoV-2 by FDA under an Emergency Use Authorization (EUA). This EUA will remain in effect (meaning this test can be used) for the duration of the COVID-19 declaration under Section 564(b)(1) of the Act, 21 U.S.C. section 360bbb-3(b)(1), unless the authorization is terminated or revoked.     Resp Syncytial Virus by PCR NEGATIVE NEGATIVE Final    Comment: (NOTE) Fact Sheet for Patients: BloggerCourse.com  Fact Sheet for Healthcare Providers: SeriousBroker.it  This test is not yet approved or cleared by the [place] FDA and has been authorized for detection and/or diagnosis of SARS-CoV-2 by FDA under an Emergency Use Authorization (EUA). This EUA will remain in effect (meaning this test can be used) for the duration of the COVID-19 declaration under Section 564(b)(1) of the Act, 21 U.S.C. section 360bbb-3(b)(1), unless the authorization is terminated or revoked.  Performed at [facility], [address], [place], [place] [address]     Today   Subjective    Ajani Schnieders today has no headache,no chest abdominal pain,no new weakness tingling or numbness, feels much better wants to go home today.  Not want any more blood work or stay in the hospital, discussed with patient's brother, says she does it all the time she is hospitalized and goes home.   Objective   Blood pressure (!) 129/55, pulse 78, temperature 98 F (36.7 C), temperature source Oral, resp. rate 15, height 5\' 3"  (1.6 m), weight 66.5 kg, SpO2 99%.   Intake/Output Summary (Last 24 hours) at 10/17/2023 0833 Last data filed at 10/17/2023 0500 Gross per 24 hour  Intake 240 ml  Output 1000 ml  Net -760 ml    Exam  Awake Alert, No new F.N deficits,     [place].AT,PERRAL Supple Neck,   Symmetrical Chest wall movement, Good air movement bilaterally, CTAB RRR,No Gallops,   +ve B.Sounds, Abd Soft, Non tender,  No Cyanosis, Clubbing or edema    Data Review   Recent Labs  Lab 10/15/23 1520 10/15/23 1522 10/15/23 1549 10/16/23 0501 10/16/23 0552 10/17/23 0515  WBC 5.1  --   --   --   --  6.3  HGB 10.3* 10.5* 10.9* 11.6* 11.2* 10.2*  HCT 34.4* 31.0* 32.0* 34.0* 33.0* 33.3*  PLT 239  --   --   --   --  249  MCV 96.1  --   --   --   --  93.0  MCH 28.8  --   --   --   --  28.5  MCHC 29.9*  --   --   --   --  30.6  RDW 14.9  --   --   --   --  14.2  LYMPHSABS 1.1  --   --   --   --   --   MONOABS 0.4  --   --   --   --   --   EOSABS 0.1  --   --   --   --   --   BASOSABS 0.0  --   --   --   --   --  Recent Labs  Lab 10/15/23 1520 10/15/23 1522 10/15/23 1547 10/15/23 1549 10/15/23 1559 10/16/23 0501 10/16/23 0552 10/17/23 0515  NA 143 140  --  140  --  142 140 139  K 4.4 4.2  --  4.4  --  4.5 4.3 4.4  CL 100 99  --   --   --   --   --  94*  CO2 34*  --   --   --   --   --   --  34*  ANIONGAP 9  --   --   --   --   --   --  11  GLUCOSE 108* 100*  --   --   --   --   --  177*  BUN 10 12  --   --   --   --   --  19  CREATININE 1.25* 1.40*  --   --   --   --   --  1.39*  AST 17  --   --   --   --   --   --   --   ALT 15  --   --   --   --   --   --   --   ALKPHOS 40  --   --   --   --   --   --   --   BILITOT 0.4  --   --   --   --   --   --   --   ALBUMIN 3.3*  --   --   --   --   --   --   --   LATICACIDVEN  --   --  0.9  --   --   --   --   --   INR 1.2  --   --   --   --   --   --   --   TSH 0.505  --   --   --   --   --   --   --   AMMONIA  --   --   --   --  32  --   --   --   MG  --   --   --   --   --   --   --  1.9  PHOS  --   --   --   --   --   --   --  3.3  CALCIUM 9.2  --   --   --   --   --   --  9.4    Total Time in preparing paper work, data evaluation and todays exam - 35 minutes  Signature  -     Susa Raring M.D on 10/17/2023 at 8:33 AM   -  To page go to [email or web address]

## 2023-10-17 NOTE — Progress Notes (Signed)
 Pt's brother on the way with pt's clothing. He will transport home. Dr. Thedore Mins stated to hold pt in her room until brother arrives, do not take to discharge lounge.

## 2023-10-17 NOTE — Plan of Care (Addendum)
 Pt has rested quietly throughout the night with no distress noted. Alert and oriented to person and place. Got confused a few times during the night. Reoriented easily. O2 weaned down to [facility]. A-fib on the monitor. Up to [facility]. Had a BM. No complaints voiced.     Problem: Clinical Measurements: Goal: Respiratory complications will improve Outcome: Progressing   Problem: Activity: Goal: Risk for activity intolerance will decrease Outcome: Progressing   Problem: Coping: Goal: Level of anxiety will decrease Outcome: Progressing   Problem: Elimination: Goal: Will not experience complications related to bowel motility Outcome: Progressing Goal: Will not experience complications related to urinary retention Outcome: Progressing   Problem: Pain Managment: Goal: General experience of comfort will improve and/or be controlled Outcome: Progressing

## 2023-10-17 NOTE — Evaluation (Signed)
 Physical Therapy Evaluation and Discharge Patient Details Name: Brianna Porter MRN: [id number] DOB: Jan 16, 1961 Today's Date: 10/17/2023  History of Present Illness  63 y.o. female presented 10/15/23 who was found minimally responsive at home by her sister. MRI brain negative. EEG moderate encephalopathy. PMH significant for chronic atrial fibrillation on Eliquis, adrenal insufficiency, severe cardiomyopathy with EF of 40 to 45%, CKD stage IIIb, history of amiodarone induced thyrotoxicosis  Clinical Impression   Patient evaluated by Physical Therapy with no further acute PT needs identified. All education has been completed and the patient has no further questions. Patient is at her baseline for mobility (modified independent with use of RW for transfers; uses wheelchair for primary locomotion; brother assists her with up/down steps to enter home). PT is signing off. Thank you for this referral.         If plan is discharge home, recommend the following: Help with stairs or ramp for entrance   Can travel by private vehicle        Equipment Recommendations None recommended by PT  Recommendations for Other Services       Functional Status Assessment Patient has not had a recent decline in their functional status     Precautions / Restrictions Precautions Precautions: Fall      Mobility  Bed Mobility Overal bed mobility: Independent                  Transfers Overall transfer level: Modified independent Equipment used: Rolling walker (2 wheels)               General transfer comment: using RW sit to stand and pivot to bed from [facility]    Ambulation/Gait               General Gait Details: pt primarily uses wheelchair at home  Stairs            Wheelchair Mobility     Tilt Bed    Modified Rankin (Stroke Patients Only)       Balance Overall balance assessment: Needs assistance Sitting-balance support: No upper extremity supported, Feet  supported Sitting balance-Leahy Scale: Good Sitting balance - Comments: >good NT   Standing balance support: Bilateral upper extremity supported, During functional activity, Reliant on assistive device for balance Standing balance-Leahy Scale: Poor Standing balance comment: requires UE support                             Pertinent Vitals/Pain Pain Assessment Pain Assessment: No/denies pain    Home Living Family/patient expects to be discharged to:: Private residence Living Arrangements: Alone Available Help at Discharge: Family;Available PRN/intermittently Type of Home: House Home Access: Stairs to enter Entrance Stairs-Rails: Doctor, general practice of Steps: 3   Home Layout: One level Home Equipment: Agricultural consultant (2 wheels);Cane - single point;BSC/3in1;Tub bench;Wheelchair - manual      Prior Function Prior Level of Function : Needs assist             Mobility Comments: uses RW at times, but mostly w/c; brother helps her up steps and takes her to doctor appointments ADLs Comments: uses tub bench to shower; orders groceries and has them delivered     Extremity/Trunk Assessment   Upper Extremity Assessment Upper Extremity Assessment: Generalized weakness    Lower Extremity Assessment Lower Extremity Assessment: Generalized weakness    Cervical / Trunk Assessment Cervical / Trunk Assessment: Normal  Communication   Communication Communication: No apparent  difficulties    Cognition Arousal: Alert Behavior During Therapy: Anxious   PT - Cognitive impairments: Other                       PT - Cognition Comments: paranoid that people in room next to her are upset that she overheard something and that they are going to come after her Following commands: Intact       Cueing Cueing Techniques: Verbal cues     General Comments General comments (skin integrity, edema, etc.): Patient eager to go home. Sats 100% on 3L (uses 2L  at home per pt)    Exercises     Assessment/Plan    PT Assessment Patient does not need any further PT services  PT Problem List         PT Treatment Interventions      PT Goals (Current goals can be found in the Care Plan section)  Acute Rehab PT Goals Patient Stated Goal: go home today PT Goal Formulation: All assessment and education complete, DC therapy    Frequency       Co-evaluation               AM-PAC PT "6 Clicks" Mobility  Outcome Measure Help needed turning from your back to your side while in a flat bed without using bedrails?: None Help needed moving from lying on your back to sitting on the side of a flat bed without using bedrails?: None Help needed moving to and from a bed to a chair (including a wheelchair)?: None Help needed standing up from a chair using your arms (e.g., wheelchair or bedside chair)?: None Help needed to walk in hospital room?: None Help needed climbing 3-5 steps with a railing? : A Little 6 Click Score: 23    End of Session Equipment Utilized During Treatment: Oxygen Activity Tolerance: Patient tolerated treatment well Patient left: in bed;with call bell/phone within reach;with bed alarm set Nurse Communication: Mobility status;Other (comment) (ok for dc from PT perspective) PT Visit Diagnosis: Other abnormalities of gait and mobility (R26.89)    Time: 7829-5621 PT Time Calculation (min) (ACUTE ONLY): 25 min   Charges:   PT Evaluation $PT Eval Low Complexity: 1 Low   PT General Charges $$ ACUTE PT VISIT: 1 Visit          Jerolyn Center, PT Acute Rehabilitation Services  Office [phone]   Zena Amos 10/17/2023, 8:56 AM

## 2023-10-17 NOTE — TOC Transition Note (Signed)
 Transition of Care [facility]) - Discharge Note   Patient Details  Name: Brianna Porter MRN: [id number] Date of Birth: 11/27/1960  [facility]) CM/SW Contact:  Gordy Clement, RN Phone Number: 10/17/2023, 10:01 AM   Clinical Narrative:     Patient will DC to home today. Brother will pick up and have portable O2 in car for transport. No recs for DME or Home health   No additional TOC needs          Patient Goals and CMS Choice            Discharge Placement                       Discharge Plan and Services Additional resources added to the After Visit Summary for                                       Social Drivers of Health (SDOH) Interventions SDOH Screenings   Food Insecurity: No Food Insecurity (10/16/2023)  Recent Concern: Food Insecurity - Medium Risk (09/12/2023)   Received from [facility]  Housing: Low Risk  (10/16/2023)  Transportation Needs: No Transportation Needs (10/16/2023)  Utilities: Not At Risk (10/16/2023)  Tobacco Use: Medium Risk (10/15/2023)     Readmission Risk Interventions     No data to display

## 2023-10-17 NOTE — Discharge Instructions (Signed)
 Follow with Primary MD Simone Curia, MD in 7 days   Get CBC, CMP, 2 view Chest X ray -  checked next visit with your primary MD   Activity: As tolerated with Full fall precautions use walker/cane & assistance as needed  Disposition Home    Diet: Heart Healthy    Special Instructions: If you have smoked or chewed Tobacco  in the last 2 yrs please stop smoking, stop any regular Alcohol  and or any Recreational drug use.  On your next visit with your primary care physician please Get Medicines reviewed and adjusted.  Please request your Prim.MD to go over all Hospital Tests and Procedure/Radiological results at the follow up, please get all Hospital records sent to your Prim MD by signing hospital release before you go home.  If you experience worsening of your admission symptoms, develop shortness of breath, life threatening emergency, suicidal or homicidal thoughts you must seek medical attention immediately by calling 911 or calling your MD immediately  if symptoms less severe.  You Must read complete instructions/literature along with all the possible adverse reactions/side effects for all the Medicines you take and that have been prescribed to you. Take any new Medicines after you have completely understood and accpet all the possible adverse reactions/side effects.   Do not drive when taking Pain medications.  Do not take more than prescribed Pain, Sleep and Anxiety Medications

## 2023-10-24 NOTE — Telephone Encounter (Signed)
 Spoke with pt's sister who states that the pt is having chest pain, shob and afib. Advised to call 911 and go to the ED. Sister states that she goes to the hospital and leaves AMA. Again advised to call 911 and go to the ED for evaluation. Pt's sister verbalized understanding and had no additional questions.

## 2023-10-24 NOTE — Telephone Encounter (Signed)
 Patient c/o Palpitations:  STAT if patient reporting lightheadedness, shortness of breath, or chest pain  How long have you had palpitations/irregular HR/ Afib? Are you having the symptoms now? Patient is in Afib  Are you currently experiencing lightheadedness, SOB or CP?  Yes, she is having both  Do you have a history of afib (atrial fibrillation) or irregular heart rhythm? yes  Have you checked your BP or HR? (document readings if available): heart rate is running from 70 to 128   Are you experiencing any other symptoms?

## 2023-11-19 DEATH — deceased

## 2024-01-01 ENCOUNTER — Ambulatory Visit: Payer: PPO | Admitting: Cardiology
